# Patient Record
Sex: Female | Born: 1970 | Race: Black or African American | Hispanic: No | Marital: Single | Smoking: Never smoker
Health system: Southern US, Community
[De-identification: ages and names within clinical notes are randomized; demographics above are authoritative.]

## PROBLEM LIST (undated history)

## (undated) ENCOUNTER — Ambulatory Visit (HOSPITAL_COMMUNITY): Admission: EM | Discharge status: Absent without leave | Payer: Medicare HMO

## (undated) DIAGNOSIS — G809 Cerebral palsy, unspecified: Secondary | ICD-10-CM

## (undated) DIAGNOSIS — F79 Unspecified intellectual disabilities: Secondary | ICD-10-CM

## (undated) DIAGNOSIS — J45909 Unspecified asthma, uncomplicated: Secondary | ICD-10-CM

## (undated) DIAGNOSIS — R569 Unspecified convulsions: Secondary | ICD-10-CM

## (undated) HISTORY — DX: Unspecified intellectual disabilities: F79

## (undated) HISTORY — DX: Unspecified asthma, uncomplicated: J45.909

## (undated) HISTORY — DX: Unspecified convulsions: R56.9

## (undated) HISTORY — DX: Cerebral palsy, unspecified: G80.9

## (undated) HISTORY — PX: VENTRICULOPERITONEAL SHUNT: SHX204

---
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
## 2000-10-04 ENCOUNTER — Encounter: Admission: RE | Admit: 2000-10-04 | Discharge: 2000-10-04 | Payer: Self-pay | Admitting: Family Medicine

## 2000-11-29 ENCOUNTER — Encounter: Admission: RE | Admit: 2000-11-29 | Discharge: 2000-11-29 | Payer: Self-pay | Admitting: Family Medicine

## 2001-01-10 ENCOUNTER — Ambulatory Visit (HOSPITAL_COMMUNITY): Admission: RE | Admit: 2001-01-10 | Discharge: 2001-01-10 | Payer: Self-pay | Admitting: Family Medicine

## 2001-01-10 ENCOUNTER — Encounter: Admission: RE | Admit: 2001-01-10 | Discharge: 2001-01-10 | Payer: Self-pay | Admitting: Family Medicine

## 2001-02-06 ENCOUNTER — Emergency Department (HOSPITAL_COMMUNITY): Admission: EM | Admit: 2001-02-06 | Discharge: 2001-02-06 | Payer: Self-pay | Admitting: Emergency Medicine

## 2001-08-16 ENCOUNTER — Emergency Department (HOSPITAL_COMMUNITY): Admission: EM | Admit: 2001-08-16 | Discharge: 2001-08-16 | Payer: Self-pay | Admitting: Emergency Medicine

## 2002-04-25 ENCOUNTER — Encounter: Admission: RE | Admit: 2002-04-25 | Discharge: 2002-04-25 | Payer: Self-pay | Admitting: Family Medicine

## 2002-04-25 ENCOUNTER — Encounter: Payer: Self-pay | Admitting: Sports Medicine

## 2002-04-25 ENCOUNTER — Encounter: Admission: RE | Admit: 2002-04-25 | Discharge: 2002-04-25 | Payer: Self-pay | Admitting: Sports Medicine

## 2002-05-06 ENCOUNTER — Encounter: Admission: RE | Admit: 2002-05-06 | Discharge: 2002-05-06 | Payer: Self-pay | Admitting: Family Medicine

## 2002-07-01 ENCOUNTER — Encounter: Admission: RE | Admit: 2002-07-01 | Discharge: 2002-07-01 | Payer: Self-pay | Admitting: Family Medicine

## 2003-01-05 ENCOUNTER — Encounter: Admission: RE | Admit: 2003-01-05 | Discharge: 2003-01-05 | Payer: Self-pay | Admitting: Sports Medicine

## 2003-11-27 ENCOUNTER — Encounter: Admission: RE | Admit: 2003-11-27 | Discharge: 2004-02-25 | Payer: Self-pay | Admitting: Pediatrics

## 2004-03-02 ENCOUNTER — Encounter: Admission: RE | Admit: 2004-03-02 | Discharge: 2004-03-02 | Payer: Self-pay | Admitting: Pediatrics

## 2005-05-15 ENCOUNTER — Emergency Department (HOSPITAL_COMMUNITY): Admission: EM | Admit: 2005-05-15 | Discharge: 2005-05-15 | Payer: Self-pay | Admitting: Emergency Medicine

## 2006-06-07 ENCOUNTER — Ambulatory Visit: Payer: Self-pay | Admitting: Family Medicine

## 2006-10-11 DIAGNOSIS — R569 Unspecified convulsions: Secondary | ICD-10-CM | POA: Insufficient documentation

## 2006-10-11 DIAGNOSIS — J309 Allergic rhinitis, unspecified: Secondary | ICD-10-CM | POA: Insufficient documentation

## 2006-10-11 DIAGNOSIS — G809 Cerebral palsy, unspecified: Secondary | ICD-10-CM

## 2006-10-11 DIAGNOSIS — J45909 Unspecified asthma, uncomplicated: Secondary | ICD-10-CM

## 2006-10-11 DIAGNOSIS — F79 Unspecified intellectual disabilities: Secondary | ICD-10-CM

## 2006-10-12 ENCOUNTER — Encounter (INDEPENDENT_AMBULATORY_CARE_PROVIDER_SITE_OTHER): Payer: Self-pay | Admitting: *Deleted

## 2008-01-04 ENCOUNTER — Emergency Department (HOSPITAL_COMMUNITY): Admission: EM | Admit: 2008-01-04 | Discharge: 2008-01-04 | Payer: Self-pay | Admitting: Emergency Medicine

## 2008-11-26 ENCOUNTER — Telehealth: Payer: Self-pay | Admitting: *Deleted

## 2008-11-26 ENCOUNTER — Ambulatory Visit: Payer: Self-pay | Admitting: Family Medicine

## 2008-11-26 DIAGNOSIS — R05 Cough: Secondary | ICD-10-CM

## 2008-11-26 DIAGNOSIS — R051 Acute cough: Secondary | ICD-10-CM | POA: Insufficient documentation

## 2008-11-27 ENCOUNTER — Encounter: Payer: Self-pay | Admitting: *Deleted

## 2009-10-13 ENCOUNTER — Ambulatory Visit: Payer: Self-pay | Admitting: Family Medicine

## 2009-10-13 DIAGNOSIS — E669 Obesity, unspecified: Secondary | ICD-10-CM

## 2009-10-19 ENCOUNTER — Ambulatory Visit: Payer: Self-pay | Admitting: Family Medicine

## 2009-10-19 ENCOUNTER — Encounter: Payer: Self-pay | Admitting: Family Medicine

## 2009-10-21 LAB — CONVERTED CEMR LAB
CO2: 18 meq/L — ABNORMAL LOW (ref 19–32)
Calcium: 8.4 mg/dL (ref 8.4–10.5)
Chloride: 109 meq/L (ref 96–112)
HDL: 55 mg/dL (ref >39)
Sodium: 139 meq/L (ref 135–145)
Total CHOL/HDL Ratio: 2.7
Triglycerides: 85 mg/dL (ref <150)

## 2010-09-13 NOTE — Assessment & Plan Note (Signed)
Summary: checkup & flu shot/[facility]   Vital Signs:  Patient profile:   40 year old female Height:      64 inches Weight:      177.25 pounds BMI:     30.53 Temp:     98.0 degrees F oral Pulse rate:   96 / minute BP sitting:   126 / 85  (right arm)  Vitals Entered By: Terese Door (October 13, 2009 4:32 PM) CC: check up Is Patient Diabetic? No Pain Assessment Patient in pain? no        Primary Care Provider:  Eustaquio Boyden  MD  CC:  check up.  History of Present Illness: CC: checkup  MRCP on left side more.  maintaining weight.    sedentary lifestyle.  Goes to [facility].  fell 2 years ago and broke left arm.  since then less interactive.  Mom thinks feels afraid of repeat fall.  last pap smear was about 15 years ago, normal (had to go to hospital for anesthesia).  mom inquires about repeat.  Habits & Providers  Alcohol-Tobacco-Diet     Tobacco Status: never     Passive Smoke Exposure: yes  Exercise-Depression-Behavior     STD Risk: never  Current Medications (verified): 1)  Tegretol 200 Mg Tabs (Carbamazepine) .... One and Half Tab Two Times A Day By Mouth 2)  Depakote 250 Mg Tbec (Divalproex Sodium) .... One By Mouth Two Times A Day 3)  Ventolin Hfa 108 (90 Base) Mcg/act Aers (Albuterol Sulfate) .... 2 Puffs Q 6 Hours As Needed Wheeze 4)  Singulair 10 Mg Tabs (Montelukast Sodium) .... One By Mouth Qdaily 5)  Topamax 25 Mg Tabs (Topiramate) .... 3 Two Times A Day 6)  Aerochamber Max W/flow-Vu  Misc Actuary) .... To Use With Albuterol 7)  Guaifenesin-Codeine 300-10 Mg/61ml Liqd (Guaifenesin-Codeine) .... 5cc Every 4-6 Hours As Needed Cough; Disp 100cc 8)  Allegra 180 Mg Tabs (Fexofenadine Hcl) .... Take One By Mouth Daily  Allergies (verified): No Known Drug Allergies  Past History:  Past medical, surgical, family and social histories (including risk factors) reviewed for relevance to current acute and chronic problems.  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  Family History: Reviewed history from 10/11/2006 and no changes required. g`mother w/ HTN, DM, sister and mom with `asthma`, migraines  Social History: Reviewed history from 11/26/2008 and no changes required. Lives w/ Mom on w/ends, grandparents during wk.  No tob, etoh, drugs.  Mom w/ h/o drug abuse, therefore raised by g'mother.  Functions as 6yo level-- independent of ADL's.  Continent of urine and stool.  No contractures--walks w/out assistance.  Attends [employer] during week for "workshops"Passive Smoke Exposure:  yes STD Risk:  never  Physical Exam  General:  MRCP, responsive and pleasant Lungs:  CTAB, no wheezing noted Heart:  Normal rate and regular rhythm. S1 and S2 normal without gallop, murmur, click, rub or other extra sounds. Abdomen:  Bowel sounds positive,abdomen soft and non-tender without masses, organomegaly or hernias noted. Extremities:  no edema Skin:  Intact without suspicious lesions or rashes   Impression & Recommendations:  Problem # 1:  OBESITY, UNSPECIFIED (ICD-278.00) check FLP and BMP for glu.   Orders: [facility] - Est  18-39 yrs (99395)Future Orders: Basic Met-[facility] ([id number]) ... 10/19/2010 Lipid-[facility] ([id number]) ... 10/18/2010  Problem # 2:  ROUTINE GENERAL MEDICAL EXAM@HEALTH  CARE FACL (ICD-V70.0)  see #1.  no  flu shot available, mom will go to pharmacy to obtain.  Due for pap smear, but reassured mom that Aaliah is very low risk, likely benefits of screening outweigh risk and trauma of sedation etc, but ultimately up to mom.  If mom decides to pursue pap smear, consider referral to [facility] for conscious sedation?  Orders: [facility] - Est  18-39 yrs (65784)  Complete Medication List: 1)  Tegretol 200 Mg Tabs (Carbamazepine) .... One and half tab two times a day by mouth 2)  Depakote 250 Mg Tbec (Divalproex sodium) .... One by mouth two  times a day 3)  Ventolin Hfa 108 (90 Base) Mcg/act Aers (Albuterol sulfate) .... 2 puffs q 6 hours as needed wheeze 4)  Singulair 10 Mg Tabs (Montelukast sodium) .... One by mouth qdaily 5)  Topamax 25 Mg Tabs (Topiramate) .... 3 two times a day 6)  Aerochamber Max W/flow-vu Misc (Spacer/aero-holding chambers) .... To use with albuterol 7)  Guaifenesin-codeine 300-10 Mg/78ml Liqd (Guaifenesin-codeine) .... 5cc every 4-6 hours as needed cough; disp 100cc 8)  Allegra 180 Mg Tabs (Fexofenadine hcl) .... Take one by mouth daily  Patient Instructions: 1)  Please return fasting in the next few weeks for blood work. 2)  I will find out more information on doing a pelvic exam in the future. 3)  Debrina looks great today!  Pleasure to see you today.   Prevention & Chronic Care Immunizations   Influenza vaccine: Not documented   Influenza vaccine due: 04/14/2010    Tetanus booster: 04/14/2002: Done.   Tetanus booster due: 04/14/2012    Pneumococcal vaccine: Not documented  Other Screening   Pap smear: Done.  (08/14/1990)   Smoking status: never  (10/13/2009)  Lipids   Total Cholesterol: Not documented   LDL: Not documented   LDL Direct: Not documented   HDL: Not documented   Triglycerides: Not documented

## 2010-12-02 ENCOUNTER — Ambulatory Visit (INDEPENDENT_AMBULATORY_CARE_PROVIDER_SITE_OTHER): Payer: Medicaid Other | Admitting: Family Medicine

## 2010-12-02 ENCOUNTER — Encounter: Payer: Self-pay | Admitting: Family Medicine

## 2010-12-02 DIAGNOSIS — J309 Allergic rhinitis, unspecified: Secondary | ICD-10-CM

## 2010-12-02 DIAGNOSIS — H109 Unspecified conjunctivitis: Secondary | ICD-10-CM

## 2010-12-02 DIAGNOSIS — R059 Cough, unspecified: Secondary | ICD-10-CM

## 2010-12-02 DIAGNOSIS — R05 Cough: Secondary | ICD-10-CM

## 2010-12-02 MED ORDER — OLOPATADINE HCL 0.1 % OP SOLN: 1.0000 [drp] | Freq: Two times a day (BID) | OPHTHALMIC | Status: AC

## 2010-12-02 MED ORDER — FLUTICASONE PROPIONATE 50 MCG/ACT NA SUSP
1.0000 | Freq: Every day | NASAL | Status: DC
Start: 2010-12-02 — End: 2011-10-31

## 2010-12-02 MED ORDER — GUAIFENESIN-CODEINE 100-10 MG/5ML PO SYRP: 5.0000 mL | ORAL_SOLUTION | Freq: Two times a day (BID) | ORAL | Status: DC | PRN

## 2010-12-02 MED ORDER — CIPROFLOXACIN HCL 0.3 % OP SOLN: 1.0000 [drp] | Freq: Three times a day (TID) | OPHTHALMIC | Status: AC

## 2010-12-02 NOTE — Assessment & Plan Note (Signed)
Add nasal steroid to Zyrtec

## 2010-12-02 NOTE — Assessment & Plan Note (Signed)
Refilled cough syrup 

## 2010-12-02 NOTE — Progress Notes (Signed)
  Subjective:    Patient ID: Patricia White, female    DOB: 02-Jun-1971, 40 y.o.   MRN: [id number]  HPI  Mother brings this special needs adult in because of severe allergies this season.  Reports that she has been rubbing her right eye constantly and it is now red and swollen.  She is taking Zyrtec but it does not seem to be enough.  She coughs all night long.  Is taking all of her asthma medications, there has been some wheezing mostly at night.   Review of Systems     Objective:   Physical Exam  Constitutional:       Special needs adult  HENT:       fluorescein stain of eye did not reveal any corneal involvement.  Lid and scleral conjunctiva were injected on the right eye, not on the left.  Eyes: Pupils are equal, round, and reactive to light. Right eye exhibits no discharge. Left eye exhibits no discharge. No scleral icterus.  Cardiovascular: Normal rate, regular rhythm and normal heart sounds.   Pulmonary/Chest: Effort normal and breath sounds normal. She has no wheezes.  Lymphadenopathy:    She has no cervical adenopathy.          Assessment & Plan:

## 2010-12-02 NOTE — Assessment & Plan Note (Signed)
Allergic with suspected bacterial superimposed on the right.  Cipro ophth gtt on the right eye and Patanol in both eyes, continue Zyrtec.

## 2010-12-02 NOTE — Patient Instructions (Signed)
Right eye seems to be secondarily infected probably from scratching, use the Cipro every 2 hours tonight until she goes to bed then three times a day until the eye is better. Eye drops for allergies, hold on the right until that eye is better Add nasal spray and cough syrup but stay on the Zyrtec

## 2011-05-12 ENCOUNTER — Ambulatory Visit (INDEPENDENT_AMBULATORY_CARE_PROVIDER_SITE_OTHER): Payer: Medicaid Other | Admitting: Family Medicine

## 2011-05-12 ENCOUNTER — Encounter: Payer: Self-pay | Admitting: Family Medicine

## 2011-05-12 VITALS — BP 122/92 | HR 94 | Temp 97.9°F | Ht 63.0 in | Wt 180.7 lb

## 2011-05-12 DIAGNOSIS — Z23 Encounter for immunization: Secondary | ICD-10-CM

## 2011-05-12 MED ORDER — OMEPRAZOLE 20 MG PO CPDR: 20.0000 mg | DELAYED_RELEASE_CAPSULE | Freq: Every day | ORAL | Status: DC

## 2011-05-12 NOTE — Patient Instructions (Signed)
It was nice meeting you today.  Please be sure you are taking your zyrtec for you allergies.  I will see you back next year or sooner as needed.

## 2011-05-15 NOTE — Progress Notes (Signed)
  Subjective:     Patricia White is a 40 y.o. special needs female and is here for a comprehensive physical exam. The patient reports problems with her allergies.  She is having increased stuffy nose, sneezing, and itchy eyes.  Takes zyrtec, singulair and patanol.  This helps some.  Also complains of occasional abdominal pain.  Gets sour taste in her mouth sometimes.  No vomiting.  No blood in stool. Has had problems with constipation in the past but bowel movements have been normal recently.    History   Social History  . Marital Status: Single    Spouse Name: N/A    Number of Children: N/A  . Years of Education: N/A   Occupational History  . Not on file.   Social History Main Topics  . Smoking status: Never Smoker   . Smokeless tobacco: Not on file  . Alcohol Use: Not on file  . Drug Use: Not on file  . Sexually Active: Not on file   Other Topics Concern  . Not on file   Social History Narrative  . No narrative on file   Health Maintenance  Topic Date Due  . Pap Smear  12/18/1990  . Tetanus/tdap  04/14/2012  . Influenza Vaccine  05/14/2012    The following portions of the patient's history were reviewed and updated as appropriate: allergies, current medications, past family history, past medical history, past social history, past surgical history and problem list.  Review of Systems A comprehensive review of systems was negative.   Objective:    BP 122/92  Pulse 94  Temp(Src) 97.9 F (36.6 C) (Oral)  Ht 5\' 3"  (1.6 m)  Wt 180 lb 11.2 oz (81.965 kg)  BMI 32.01 kg/m2 General appearance: alert, no distress and MRCP Head: Normocephalic, without obvious abnormality, atraumatic Eyes: conjunctivae/corneas clear. PERRL, EOM's intact. Fundi benign. Ears: normal TM's and external ear canals both ears Nose: Nares normal. Septum midline. Mucosa normal. No drainage or sinus tenderness. Throat: lips, mucosa, and tongue normal; teeth and gums normal Neck: no adenopathy, supple,  symmetrical, trachea midline and thyroid not enlarged, symmetric, no tenderness/mass/nodules Lungs: clear to auscultation bilaterally Heart: regular rate and rhythm, S1, S2 normal, no murmur, click, rub or gallop Abdomen: soft, non-tender; bowel sounds normal; no masses,  no organomegaly and Mild epigastric tenderness.  No guarding or distention.  NABS Extremities: extremities normal, atraumatic, no cyanosis or edema Pulses: 2+ and symmetric Skin: Skin color, texture, turgor normal. No rashes or lesions    Assessment/Plan    1.  Allergies:  I will have her continue her zyrtec, cetirizine and patanol as needed for symptoms.  She does occasionally miss doses, encouraged to take daily for symptom control 2.  Abdominal pain:  I think this may be related to GERD, I will start her on a PPI to see if this improves her symptoms.  3.  Female exam:  Declined today,  Low risk.  Last pap had to be done with sedation.

## 2011-10-31 ENCOUNTER — Ambulatory Visit (INDEPENDENT_AMBULATORY_CARE_PROVIDER_SITE_OTHER): Payer: Medicaid Other | Admitting: Family Medicine

## 2011-10-31 VITALS — BP 118/82 | HR 114 | Temp 98.4°F | Ht 63.0 in | Wt 178.2 lb

## 2011-10-31 DIAGNOSIS — R05 Cough: Secondary | ICD-10-CM

## 2011-10-31 DIAGNOSIS — J309 Allergic rhinitis, unspecified: Secondary | ICD-10-CM

## 2011-10-31 MED ORDER — FLUTICASONE PROPIONATE 50 MCG/ACT NA SUSP: 1.0000 | Freq: Every day | NASAL | Status: DC

## 2011-10-31 MED ORDER — CETIRIZINE HCL 10 MG PO TABS: 10.0000 mg | ORAL_TABLET | Freq: Every day | ORAL | Status: DC

## 2011-10-31 NOTE — Progress Notes (Signed)
  Subjective:    Patient ID: Patricia White, female    DOB: 03/24/1971, 41 y.o.   MRN: [id number]  HPI Discussed patient and examined with MS3.  Agree with her documentation.  1 week of cough, sore throat.  No dyspnea, fever, emesis, diarrhea.  I have reviewed patient's  PMH, FH, and Social history and Medications as related to this visit. History of asthma  Review of Systems See hpi    Objective:   Physical Exam GEN: Alert & Oriented, No acute distress HEENT: /AT. EOMI, PERRLA, no conjunctival injection or scleral icterus.  Bilateral tympanic membranes intact without erythema or effusion.  .  Nares without edema or rhinorrhea.  Oropharynx is without erythema or exudates.  No anterior or posterior cervical lymphadenopathy. CV:  Regular Rate & Rhythm, no murmur Respiratory:  Normal work of breathing, CTAB        Assessment & Plan:

## 2011-10-31 NOTE — Progress Notes (Signed)
  Subjective:    Patient ID: Patricia White, female    DOB: 1971-03-03, 41 y.o.   MRN: [id number]  HPI Patricia White is a 41 yo F with PMHx of asthma and seizures who presents with cough and pharyngitis for one week.  History is provided by pt and her grandmother.  Pt reports that most bothersome symptoms are cough, sore throat, runny nose, and congestion.  Pt denies HA, earaches, f/n/v/d, myalgias. Grandmother reports that cough has been getting worse during the past week.  She has tried cough medicine and Nyquil which helped for a few hours.  Reports that she has not been using her albuterol inhaler or taking any allergy medications.   Review of Systems  All other systems reviewed and are negative.       Objective:   Physical Exam  Constitutional: She appears well-developed and well-nourished.  HENT:  Right Ear: Tympanic membrane, external ear and ear canal normal.  Left Ear: Tympanic membrane, external ear and ear canal normal.  Nose: Mucosal edema present.  Mouth/Throat: Uvula is midline. Posterior oropharyngeal edema and posterior oropharyngeal erythema present. No oropharyngeal exudate.  Eyes: Conjunctivae and EOM are normal. Pupils are equal, round, and reactive to light.  Neck: Normal range of motion. Neck supple.  Cardiovascular: Normal rate, regular rhythm and normal heart sounds.  Exam reveals no gallop and no friction rub.   No murmur heard. Pulmonary/Chest: Effort normal and breath sounds normal. No stridor. No respiratory distress. She has no wheezes. She has no rales.  Lymphadenopathy:    She has no cervical adenopathy.  Skin: Skin is warm and dry.   Blood pressure 118/82, pulse 114, temperature 98.4 F (36.9 C), temperature source Oral, weight 178 lb 3.2 oz (80.831 kg), last menstrual period 10/24/2011.  Rapid Strep: negative      Assessment & Plan:

## 2011-10-31 NOTE — Patient Instructions (Signed)
Thank you for visiting [place] today. You have bronchitis caused by a virus.  This will most likely resolve on its own in a few days. Your asthma and allergies are also making you feel bad. Take zyrtec once daily. Use flonase nasal spray two sprays in each nostril in the morning and in the evening. Please call us or return if you develop a fever, shortness of breath, or worsening cough.   Acute Bronchitis Bronchitis is when the organs and tissues involved in breathing get puffy (swollen) and can leak fluid. This makes it harder for air to get in and out of the lungs. You may cough a lot and produce thick spit (mucus). Acute means the illness started suddenly. HOME CARE  Rest.   Drink enough fluids to keep the pee (urine) clear or pale yellow.   Medicines may be given that will open up your airways to help you breathe better. Only take medicine as told by your doctor.   Use a cool mist vaporizer. This will help to thin any thick spit.   Do not smoke. Avoid secondhand smoke.  GET HELP RIGHT AWAY IF:   You have a temperature by mouth above 102 F (38.9 C), not controlled by medicine.   You have chills.   You develop severe shortness of breath or chest pain.   You have bloody spit mixed with mucus (sputum).   You throw up (vomit) often.   You lose too much body fluid (dehydrated).   You have a severe headache.   You feel faint.   You do not improve after 1 week of treatment.  MAKE SURE YOU:   Understand these instructions.   Will watch your condition.   Will get help right away if you are not doing well or get worse.  Document Released: 01/17/2008 Document Revised: 07/20/2011 Document Reviewed: 08/18/2009 [facility] Patient Information 2012 [place], [place].

## 2011-10-31 NOTE — Assessment & Plan Note (Addendum)
Pt reports one week history of cough and pharyngitis.  Rapid strep negative today.  Do not suspect bacterial infection as lungs are CTAB and pt denies fever.  Suspect bronchitis likely due to viral etiology.  Instructed patient to use flonase nasal spray, zyrtec, and saltwater gargles to help with symptoms.  Agree with MS3 note as above.  Dx: viral uri.  Supportive tx, given red flags for return.

## 2012-06-26 ENCOUNTER — Ambulatory Visit: Payer: Medicaid Other

## 2012-07-01 ENCOUNTER — Ambulatory Visit (INDEPENDENT_AMBULATORY_CARE_PROVIDER_SITE_OTHER): Payer: Medicaid Other | Admitting: *Deleted

## 2012-07-01 DIAGNOSIS — Z23 Encounter for immunization: Secondary | ICD-10-CM

## 2013-01-14 ENCOUNTER — Telehealth: Payer: Self-pay | Admitting: Nurse Practitioner

## 2013-01-14 MED ORDER — DIVALPROEX SODIUM ER 250 MG PO TB24: 500.0000 mg | ORAL_TABLET | Freq: Two times a day (BID) | ORAL | Status: DC

## 2013-01-14 MED ORDER — TEGRETOL 200 MG PO TABS: 300.0000 mg | ORAL_TABLET | Freq: Two times a day (BID) | ORAL | Status: DC

## 2013-01-14 MED ORDER — TOPAMAX 25 MG PO TABS: 75.0000 mg | ORAL_TABLET | Freq: Three times a day (TID) | ORAL | Status: DC

## 2013-01-14 NOTE — Telephone Encounter (Signed)
Rxs printed, signed and faxed.

## 2013-06-11 ENCOUNTER — Ambulatory Visit (INDEPENDENT_AMBULATORY_CARE_PROVIDER_SITE_OTHER): Payer: Medicare Other | Admitting: *Deleted

## 2013-06-11 DIAGNOSIS — Z23 Encounter for immunization: Secondary | ICD-10-CM | POA: Diagnosis not present

## 2013-06-26 ENCOUNTER — Ambulatory Visit: Payer: Self-pay | Admitting: Nurse Practitioner

## 2013-07-14 ENCOUNTER — Other Ambulatory Visit: Payer: Self-pay | Admitting: Nurse Practitioner

## 2013-07-23 ENCOUNTER — Ambulatory Visit: Payer: Self-pay | Admitting: Nurse Practitioner

## 2013-08-13 ENCOUNTER — Other Ambulatory Visit: Payer: Self-pay | Admitting: Diagnostic Neuroimaging

## 2013-08-13 ENCOUNTER — Other Ambulatory Visit: Payer: Self-pay | Admitting: Nurse Practitioner

## 2013-09-16 ENCOUNTER — Other Ambulatory Visit: Payer: Self-pay | Admitting: Diagnostic Neuroimaging

## 2013-09-17 ENCOUNTER — Other Ambulatory Visit: Payer: Self-pay

## 2013-09-17 MED ORDER — TOPAMAX 25 MG PO TABS: 75.0000 mg | ORAL_TABLET | Freq: Two times a day (BID) | ORAL | Status: DC

## 2013-09-17 MED ORDER — DEPAKOTE ER 250 MG PO TB24: 500.0000 mg | ORAL_TABLET | Freq: Two times a day (BID) | ORAL | Status: DC

## 2013-09-17 MED ORDER — TEGRETOL 200 MG PO TABS: 300.0000 mg | ORAL_TABLET | Freq: Two times a day (BID) | ORAL | Status: DC

## 2013-09-17 NOTE — Telephone Encounter (Signed)
[employer] requires written Rx's for BMN drugs.  They will not pay the claim if it is sent E-Rx.  Patient has an appt scheduled in May

## 2013-12-12 ENCOUNTER — Ambulatory Visit (INDEPENDENT_AMBULATORY_CARE_PROVIDER_SITE_OTHER): Payer: Medicare Other | Admitting: Diagnostic Neuroimaging

## 2013-12-12 ENCOUNTER — Encounter (INDEPENDENT_AMBULATORY_CARE_PROVIDER_SITE_OTHER): Payer: Self-pay

## 2013-12-12 ENCOUNTER — Encounter: Payer: Self-pay | Admitting: Diagnostic Neuroimaging

## 2013-12-12 VITALS — BP 126/84 | HR 99 | Temp 97.8°F | Ht 63.0 in | Wt 177.0 lb

## 2013-12-12 DIAGNOSIS — F71 Moderate intellectual disabilities: Secondary | ICD-10-CM | POA: Diagnosis not present

## 2013-12-12 DIAGNOSIS — G40109 Localization-related (focal) (partial) symptomatic epilepsy and epileptic syndromes with simple partial seizures, not intractable, without status epilepticus: Secondary | ICD-10-CM

## 2013-12-12 DIAGNOSIS — G253 Myoclonus: Secondary | ICD-10-CM | POA: Diagnosis not present

## 2013-12-12 NOTE — Progress Notes (Signed)
[facility]  PATIENT: Patricia White DOB: 08/20/1970  REFERRING CLINICIAN:  HISTORY FROM: patient and mother REASON FOR VISIT: follow up   HISTORICAL  CHIEF COMPLAINT:  Chief Complaint  Patient presents with  . Follow-up    rm 7, 1 year per EMR    HISTORY OF PRESENT ILLNESS:   UPDATE 12/12/13: 43 year old female with moderate MR, congenital left hemiparesis, here for follow up of seizure d/o. Last seizure in 2009. Still with intermittent myoclonus in the AM, 3 times per week. Tolerating meds.  PRIOR HPI (07/23/12, CM):  43 year old black female returns today for followup. She was last seen in our office 07/17/11. She is a previous pt of Dr. Sharene SkeansHickling. 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 She returns today with her grandmother. She was going  to [facility] but the funding was cut.  She has had no seizures  in several years.  She is currently on 3 different medications.She has had no falls. Continues to have left lower extremity weakness with AFO in place. She intermittently wears a helmet but she does not have it on today. No hospitalizations or acute infections since last seen.    REVIEW OF SYSTEMS: Full 14 system review of systems performed and notable only for nothing.  ALLERGIES: Allergies  Allergen Reactions  . Seasonal Ic [Cholestatin]     Runny nose, sneezing    HOME MEDICATIONS: Outpatient Prescriptions Prior to Visit  Medication Sig Dispense Refill  . DEPAKOTE ER 250 MG 24 hr tablet Take 2 tablets (500 mg total) by mouth 2 (two) times daily.  120 tablet  3  . TEGRETOL 200 MG tablet Take 1.5 tablets (300 mg total) by mouth 2 (two) times daily.  90 tablet  3  . TOPAMAX 25 MG tablet Take 3 tablets (75 mg total) by mouth 2 (two) times daily.  180 tablet  3  . albuterol (VENTOLIN HFA) 108 (90 BASE) MCG/ACT inhaler Inhale 2 puffs into the lungs every 6 (six) hours as needed. wheeze       . cetirizine (ZYRTEC) 10 MG  tablet Take 1 tablet (10 mg total) by mouth daily. Take one by mouth at bedtime for allergies(change from allegra 2/2 prior authorization)  30 tablet  11  . fluticasone (FLONASE) 50 MCG/ACT nasal spray Place 1 spray into the nose daily.  16 g  2  . montelukast (SINGULAIR) 10 MG tablet One by mouth qdaily       . omeprazole (PRILOSEC) 20 MG capsule Take 1 capsule (20 mg total) by mouth daily.  30 capsule  6  . Spacer/Aero-Holding Chambers (AEROCHAMBER MAX W/FLOW-VU) MISC To use with albuterol        No facility-administered medications prior to visit.    PAST MEDICAL HISTORY: No past medical history on file.  PAST SURGICAL HISTORY: Past Surgical History  Procedure Laterality Date  . Ventriculoperitoneal shunt      FAMILY HISTORY: Family History  Problem Relation Age of Onset  . Migraines Mother   . Cancer Mother     liver cancer  . Diabetes Maternal Aunt   . Hypertension Maternal Grandmother     SOCIAL HISTORY:  History   Social History  . Marital Status: Single    Spouse Name: N/A    Number of Children: 0  . Years of Education: [email or web address].school   Occupational History  .      disabled0   Social History Main Topics  .  Smoking status: Never Smoker   . Smokeless tobacco: Never Used  . Alcohol Use: No  . Drug Use: No  . Sexual Activity: Not on file   Other Topics Concern  . Not on file   Social History Narrative   Patient is right handed, resides with grandmother([name] Berges)     PHYSICAL EXAM  Filed Vitals:   12/12/13 1204  BP: 126/84  Pulse: 99  Temp: 97.8 F (36.6 C)  TempSrc: Oral  Height: 5\' 3"  (1.6 m)  Weight: 177 lb (80.287 kg)    Not recorded    Body mass index is 31.36 kg/(m^2).  GENERAL EXAM: Patient is in no distress; well developed, nourished and groomed; neck is supple; POOR DENTITION.  CARDIOVASCULAR: Regular rate and rhythm, no murmurs, no carotid bruits  NEUROLOGIC: MENTAL STATUS: awake, alert, DECR FLUENCY, COMP INTACT.  PLEASANT.  CRANIAL NERVE: no papilledema on fundoscopic exam, pupils equal and reactive to light, visual fields full to confrontation, extraocular muscles intact, no nystagmus, facial sensation and strength symmetric, hearing intact, palate elevates symmetrically, uvula midline, shoulder shrug symmetric, tongue midline. MOTOR: LEFT ARM AND LEG ATROPHY, CONGENTIAL SPASTIC LEFT HEMIPARESIS; RIGHT SIDE 5/5/. LUE (4), LLE (3-4).  SENSORY: normal and symmetric to light touch  COORDINATION: finger-nose-finger, fine finger movements normal REFLEXES: deep tendon reflexes present and symmetric GAIT/STATION: SPASTIC, LEFT HEMIPARETIC GAIT    DIAGNOSTIC DATA (LABS, IMAGING, TESTING) - I reviewed patient records, labs, notes, testing and imaging myself where available.  No results found for this basename: WBC, HGB, HCT, MCV, PLT      Component Value Date/Time   NA 139 10/19/2009 1830   K 3.7 10/19/2009 1830   CL 109 10/19/2009 1830   CO2 18* 10/19/2009 1830   GLUCOSE 87 10/19/2009 1830   BUN 8 10/19/2009 1830   CREATININE 0.68 10/19/2009 1830   CALCIUM 8.4 10/19/2009 1830   Lab Results  Component Value Date   CHOL 146 10/19/2009   HDL 55 10/19/2009   LDLCALC 74 10/19/2009   TRIG 85 10/19/2009   CHOLHDL 2.7 Ratio 10/19/2009   No results found for this basename: HGBA1C   No results found for this basename: VITAMINB12   No results found for this basename: TSH      ASSESSMENT AND PLAN  43 y.o. 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 Doing well.  PLAN: - continue TPX, VPA, CBZ  Return in about 1 year (around 12/13/2014).    Suanne MarkerVIKRAM R. Kier Smead, MD 12/12/2013, 1:24 PM Certified in Neurology, Neurophysiology and Neuroimaging  [facility] [address], [address] [place], [place] [address] [phone]

## 2013-12-12 NOTE — Patient Instructions (Signed)
Continue current medications. 

## 2014-01-11 ENCOUNTER — Other Ambulatory Visit: Payer: Self-pay | Admitting: Diagnostic Neuroimaging

## 2014-01-14 ENCOUNTER — Other Ambulatory Visit: Payer: Self-pay | Admitting: Diagnostic Neuroimaging

## 2014-01-20 ENCOUNTER — Ambulatory Visit: Payer: Medicaid Other | Admitting: Family Medicine

## 2014-01-23 ENCOUNTER — Ambulatory Visit (INDEPENDENT_AMBULATORY_CARE_PROVIDER_SITE_OTHER): Payer: Medicare Other | Admitting: Family Medicine

## 2014-01-23 ENCOUNTER — Encounter: Payer: Self-pay | Admitting: Family Medicine

## 2014-01-23 VITALS — BP 122/82 | HR 98 | Temp 97.8°F | Ht 63.0 in | Wt 179.8 lb

## 2014-01-23 DIAGNOSIS — T148 Other injury of unspecified body region: Secondary | ICD-10-CM

## 2014-01-23 DIAGNOSIS — W57XXXA Bitten or stung by nonvenomous insect and other nonvenomous arthropods, initial encounter: Secondary | ICD-10-CM | POA: Insufficient documentation

## 2014-01-23 MED ORDER — HYDROCORTISONE 0.5 % EX CREA: 1.0000 "application " | TOPICAL_CREAM | Freq: Two times a day (BID) | CUTANEOUS | Status: DC

## 2014-01-23 MED ORDER — DIPHENHYDRAMINE HCL 25 MG PO TABS: 25.0000 mg | ORAL_TABLET | Freq: Three times a day (TID) | ORAL | Status: DC | PRN

## 2014-01-23 NOTE — Assessment & Plan Note (Signed)
Most likely bed bug per clinical characteristic of lesions. They do not appear infected. Symptomatic treatment with topical hydrocortisone and oral benadryl PRN itching. Discussion and information given about get rid of infestation.  F/u as needed

## 2014-01-23 NOTE — Patient Instructions (Signed)
It has been a pleasure to see you today. Please take the medications as prescribed. I have provided information to treat infestation at home. Follow up as needed.

## 2014-01-23 NOTE — Progress Notes (Signed)
Family Medicine Office Visit Note   Subjective:   Patient ID: Leveda Annaatasha C Moyd, female  DOB: 12/23/1970, 43 y.o.. MRN: [id number]   Pt that comes today for same day appointment with care giver (aunt) complaining of itchy skin lesions. Aunt is concerned about the possibility of bed bugs bites. Lesions are located on dorsal aspect of arms bilaterally, some on posterior aspect of legs and back. No abdominal, face or anterior aspect of extremities are involved. Denies fever, chills, nausea, vomiting or other systemic symptoms. No visual identification of the bug has been done but aunt reports it is an old mattress and there is a possibility of this king of infestation since pt sleeps in same bed that her grandmother and both have same symptoms.    Review of Systems:  Per HPI  Objective:   Physical Exam: Gen:  NAD HEENT: Moist mucous membranes  CV: Regular rate and rhythm, no murmurs rubs or gallops PULM: Clear to auscultation bilaterally.  EXT: No edema Skin: erythematous punctiform lesions with hemorrhagic center present in posterior aspect of arms, legs and back. Surrounded skin abrasion form scratching. No edema, no drainage.  Assessment & Plan:

## 2014-02-12 ENCOUNTER — Other Ambulatory Visit: Payer: Self-pay | Admitting: Nurse Practitioner

## 2014-06-25 ENCOUNTER — Ambulatory Visit (INDEPENDENT_AMBULATORY_CARE_PROVIDER_SITE_OTHER): Payer: Medicare Other | Admitting: *Deleted

## 2014-06-25 ENCOUNTER — Ambulatory Visit (INDEPENDENT_AMBULATORY_CARE_PROVIDER_SITE_OTHER): Payer: Medicare Other | Admitting: Family Medicine

## 2014-06-25 ENCOUNTER — Encounter: Payer: Self-pay | Admitting: Family Medicine

## 2014-06-25 DIAGNOSIS — J069 Acute upper respiratory infection, unspecified: Secondary | ICD-10-CM

## 2014-06-25 DIAGNOSIS — Z23 Encounter for immunization: Secondary | ICD-10-CM

## 2014-06-25 DIAGNOSIS — B9789 Other viral agents as the cause of diseases classified elsewhere: Principal | ICD-10-CM

## 2014-06-25 NOTE — Progress Notes (Signed)
   Subjective:  Patricia White is a 43 y.o. female with a history of MR brought in by her aunt for cold-like symptoms.  COUGH  Has been coughing for 4 days. Cough is: nonproductive, not interfering with normal activities. Sputum production: Scant white, no blood Medications tried: none Taking blood pressure medications: no  Symptoms Runny nose: Yes Mucous in back of throat: Yes Throat burning or reflux: No Wheezing or asthma: No Fever: No Chest Pain: No Shortness of breath: No Leg swelling: No Hemoptysis: No Weight loss: No  ROS see HPI Smoking Status noted  Objective:  Vitals reviewed.  Gen:  43 y.o. female in NAD HEENT: MMM, TMs poorly visualized due to yellow/brown cerumen. EOMI, PERRL, anicteric sclerae CV: RRR, no murmur Resp: Non-labored, CTAB, no wheezes noted Assessment & Plan:  Patricia White is a 43 y.o. female with:  Problem List Items Addressed This Visit      Respiratory   Viral URI with cough - Primary    No red flags.  - Tylenol q6h  - Adequate hydration - Expect clinical improvement in 10-14 total days of illness - RTC if worsening after initial improvement or new fever.  - OK for flu shot today.

## 2014-06-25 NOTE — Assessment & Plan Note (Addendum)
No red flags.  - Tylenol q6h  - Adequate hydration - Expect clinical improvement in 10-14 total days of illness - RTC if worsening after initial improvement or new fever.  - OK for flu shot today.

## 2014-06-25 NOTE — Patient Instructions (Signed)
You have the common cold. No antibiotics are required. Just stay well hydrated and come back to the clinic if you start getting worse. You should be better in 10-14 days.

## 2014-12-22 ENCOUNTER — Ambulatory Visit: Payer: Medicaid Other | Admitting: Diagnostic Neuroimaging

## 2014-12-23 ENCOUNTER — Encounter: Payer: Self-pay | Admitting: Diagnostic Neuroimaging

## 2015-01-21 ENCOUNTER — Other Ambulatory Visit: Payer: Self-pay | Admitting: Diagnostic Neuroimaging

## 2015-01-21 NOTE — Telephone Encounter (Signed)
No showed last appt  

## 2015-02-11 ENCOUNTER — Other Ambulatory Visit: Payer: Self-pay | Admitting: Diagnostic Neuroimaging

## 2015-02-11 NOTE — Telephone Encounter (Signed)
Patient has appt scheduled

## 2015-02-23 ENCOUNTER — Ambulatory Visit (INDEPENDENT_AMBULATORY_CARE_PROVIDER_SITE_OTHER): Payer: Medicare Other | Admitting: Nurse Practitioner

## 2015-02-23 ENCOUNTER — Encounter: Payer: Self-pay | Admitting: Nurse Practitioner

## 2015-02-23 VITALS — BP 127/84 | HR 99 | Ht 63.0 in | Wt 180.5 lb

## 2015-02-23 DIAGNOSIS — R5601 Complex febrile convulsions: Secondary | ICD-10-CM

## 2015-02-23 DIAGNOSIS — Z5181 Encounter for therapeutic drug level monitoring: Secondary | ICD-10-CM

## 2015-02-23 DIAGNOSIS — G809 Cerebral palsy, unspecified: Secondary | ICD-10-CM | POA: Diagnosis not present

## 2015-02-23 DIAGNOSIS — G808 Other cerebral palsy: Secondary | ICD-10-CM | POA: Insufficient documentation

## 2015-02-23 DIAGNOSIS — G8194 Hemiplegia, unspecified affecting left nondominant side: Secondary | ICD-10-CM

## 2015-02-23 DIAGNOSIS — F71 Moderate intellectual disabilities: Secondary | ICD-10-CM | POA: Diagnosis not present

## 2015-02-23 DIAGNOSIS — G8190 Hemiplegia, unspecified affecting unspecified side: Secondary | ICD-10-CM

## 2015-02-23 MED ORDER — TOPAMAX 25 MG PO TABS: 75.0000 mg | ORAL_TABLET | Freq: Two times a day (BID) | ORAL | Status: DC

## 2015-02-23 MED ORDER — DEPAKOTE ER 250 MG PO TB24: 500.0000 mg | ORAL_TABLET | Freq: Two times a day (BID) | ORAL | Status: DC

## 2015-02-23 MED ORDER — TEGRETOL 200 MG PO TABS: ORAL_TABLET | ORAL | Status: DC

## 2015-02-23 NOTE — Patient Instructions (Signed)
Continue Depakote at current dose will refill brand Continue Topamax at current dose will refill brand Continue carbamazepine at current dose will refill brand Rx for new  AFO to left leg, [employer] [phone] CBC CMP Follow-up yearly and when necessary

## 2015-02-23 NOTE — Progress Notes (Signed)
[facility]  PATIENT: Patricia White DOB: 02/27/1971   REASON FOR VISIT: Follow-up for moderate MR, congenital left hemiparesis , history of seizure disorder , gait abnormality HISTORY FROM aunt    HISTORY OF PRESENT ILLNESS:UPDATE 02/23/15: 44 year old female with moderate MR, congenital left hemiparesis, here for follow up of seizure d/o. Last seizure in 2009. Still with intermittent myoclonus in the AM, 1-2 times per week. Tolerating meds without side effects needs refills and labs. She also needs a new AFO to her left lower extremity. No recent falls.  PRIOR HPI (07/23/12, CM): 44 year old black female returns today for followup. She was last seen in our office 07/17/11. She is a previous pt of Dr. Sharene SkeansHickling. 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 She returns today with her grandmother. [facility]. She has had no seizures in several years. She is currently on 3 different medications.She has had no falls. Continues to have left lower extremity weakness with AFO in place. She intermittently wears a helmet but she does not have it on today. No hospitalizations or acute infections since last seen.     REVIEW OF SYSTEMS: Full 14 system review of systems performed and notable only for those listed, all others are neg:  Constitutional: neg  Cardiovascular: neg Ear/Nose/Throat: neg  Skin: neg Eyes: neg Respiratory: Shortness of breath Gastroitestinal: neg  Hematology/Lymphatic: neg  Endocrine: neg Musculoskeletal: Walking difficulty Allergy/Immunology: neg Neurological: Seizure disorder, tremors Psychiatric: Confusion Sleep : neg   ALLERGIES: No Known Allergies  HOME MEDICATIONS: Outpatient Prescriptions Prior to Visit  Medication Sig Dispense Refill  . DEPAKOTE ER 250 MG 24 hr tablet TAKE 2 TABLETS BY MOUTH TWICE A DAY 120 tablet 0  . diphenhydrAMINE (BENADRYL) 25 MG tablet  Take 1 tablet (25 mg total) by mouth every 8 (eight) hours as needed. 30 tablet 0  . hydrocortisone cream 0.5 % Apply 1 application topically 2 (two) times daily. 30 g 0  . TEGRETOL 200 MG tablet TAKE 1 AND 1/2 TABLETS BY MOUTH TWICE A DAY 90 tablet 0  . TOPAMAX 25 MG tablet Take 3 tablets (75 mg total) by mouth 2 (two) times daily. 180 tablet 12   No facility-administered medications prior to visit.    PAST MEDICAL HISTORY: History reviewed. No pertinent past medical history.  PAST SURGICAL HISTORY: Past Surgical History  Procedure Laterality Date  . Ventriculoperitoneal shunt      FAMILY HISTORY: Family History  Problem Relation Age of Onset  . Migraines Mother   . Cancer Mother     liver cancer  . Diabetes Maternal Aunt   . Hypertension Maternal Grandmother     SOCIAL HISTORY: History   Social History  . Marital Status: Single    Spouse Name: N/A  . Number of Children: 0  . Years of Education: dis.school   Occupational History  .      disabled0   Social History Main Topics  . Smoking status: Never Smoker   . Smokeless tobacco: Never Used  . Alcohol Use: No  . Drug Use: No  . Sexual Activity: Not on file   Other Topics Concern  . Not on file   Social History Narrative   Patient is right handed, resides with grandmother([name] Hackenberg)     PHYSICAL EXAM  Filed Vitals:   02/23/15 1010  BP: 127/84  Pulse: 99  Height: 5\' 3"  (1.6 m)  Weight:  180 lb 8 oz (81.874 kg)   Body mass index is 31.98 kg/(m^2).  Generalized: Well developed, in no acute distress  Head: normocephalic and atraumatic,. Oropharynx benign poor dentition  Neck: Supple, no carotid bruits  Cardiac: Regular rate rhythm, no murmur  Musculoskeletal: Left hemiparesis   Neurological examination   Mentation: Alert decreased fluency pleasant .  Follows most  Commands.  Cranial nerve II-XII: Fundoscopic exam without papilledema ,Pupils were equal round reactive to light extraocular  movements were full, visual field were full on confrontational test. Facial sensation and strength were normal. hearing was intact to finger rubbing bilaterally. Uvula tongue midline. head turning and shoulder shrug were normal and symmetric.Tongue protrusion into cheek strength was normal. Motor: Left arm and leg atrophy, congenital spastic left hemiparesis and left upper extremity 4 out of 5 left lower extremity 3 out of 5 with AFO right side 5 out of 5  Sensory: normal and symmetric to light touch, pinprick, and  Vibration,  Coordination: finger-nose-finger, normal Reflexes: Symmetric upper and lower, plantar responses were flexor bilaterally. Gait and Station: Rising up from seated position without assistance, wide based stance spastic left hemiparetic gait DIAGNOSTIC DATA (LABS, IMAGING, TESTING) -  ASSESSMENT AND PLAN  44 y.o. year old female  has no past medical history of moderate MR, gait disorder, congenital left hemiparesis and seizure disorder with last seizure occurring in 2009.  Continue Depakote at current dose will refill brand generic was filled at last refill even though dispense as written Continue Topamax at current dose will refill brand Continue carbamazepine at current dose will refill brand Made aunt aware that  generic seizure medications are not equivalent to brand name drugs as the FDA allows significant variance of up to 20% in absorption and peak dose of the medication. For an epilepsy patient this could be the difference between control and breakthrough seizures and therefore I feel she should stay on name brand Rx for new  AFO to left leg, [employer] [id number] CBC CMP today Follow-up yearly and when necessary Vst time 24 min Nilda Riggs, [facility], APRN  [facility], [facility] [address], [address] [place], [place] [address] [phone]

## 2015-02-24 LAB — CBC WITH DIFFERENTIAL/PLATELET
BASOS ABS: 0 10*3/uL (ref 0.0–0.2)
BASOS: 0 %
EOS (ABSOLUTE): 0.1 10*3/uL (ref 0.0–0.4)
Eos: 2 %
HEMOGLOBIN: 11.7 g/dL (ref 11.1–15.9)
Hematocrit: 36.7 % (ref 34.0–46.6)
Immature Grans (Abs): 0 10*3/uL (ref 0.0–0.1)
Immature Granulocytes: 0 %
Lymphocytes Absolute: 1.6 10*3/uL (ref 0.7–3.1)
Lymphs: 38 %
MCH: 29.9 pg (ref 26.6–33.0)
MCHC: 31.9 g/dL (ref 31.5–35.7)
MCV: 94 fL (ref 79–97)
Monocytes Absolute: 0.3 10*3/uL (ref 0.1–0.9)
Monocytes: 8 %
Neutrophils Absolute: 2.1 10*3/uL (ref 1.4–7.0)
Neutrophils: 52 %
Platelets: 302 10*3/uL (ref 150–379)
RBC: 3.91 x10E6/uL (ref 3.77–5.28)
RDW: 14.1 % (ref 12.3–15.4)
WBC: 4.1 10*3/uL (ref 3.4–10.8)

## 2015-02-24 LAB — COMPREHENSIVE METABOLIC PANEL
ALT: 11 IU/L (ref 0–32)
AST: 15 IU/L (ref 0–40)
Albumin/Globulin Ratio: 1 — ABNORMAL LOW (ref 1.1–2.5)
Albumin: 4 g/dL (ref 3.5–5.5)
Alkaline Phosphatase: 82 IU/L (ref 39–117)
BILIRUBIN TOTAL: 0.2 mg/dL (ref 0.0–1.2)
BUN/Creatinine Ratio: 6 — ABNORMAL LOW (ref 9–23)
BUN: 4 mg/dL — AB (ref 6–24)
CHLORIDE: 106 mmol/L (ref 97–108)
CO2: 20 mmol/L (ref 18–29)
CREATININE: 0.71 mg/dL (ref 0.57–1.00)
Calcium: 8.6 mg/dL — ABNORMAL LOW (ref 8.7–10.2)
GFR calc Af Amer: 121 mL/min/{1.73_m2} (ref >59)
GFR, EST NON AFRICAN AMERICAN: 105 mL/min/{1.73_m2} (ref >59)
GLOBULIN, TOTAL: 4.1 g/dL (ref 1.5–4.5)
Glucose: 101 mg/dL — ABNORMAL HIGH (ref 65–99)
Potassium: 3.8 mmol/L (ref 3.5–5.2)
SODIUM: 142 mmol/L (ref 134–144)
Total Protein: 8.1 g/dL (ref 6.0–8.5)

## 2015-02-25 NOTE — Progress Notes (Signed)
Quick Note:  I called and gave aunt the results of her lab results (ok). She asked about prescription for depends( for incontinence). I told her to contact pcp. She said she would. ______

## 2015-03-09 NOTE — Progress Notes (Signed)
I reviewed note and agree with plan.   VIKRAM R. PENUMALLI, MD  Certified in Neurology, Neurophysiology and Neuroimaging  [facility] [address], [address] [place], [place] [address] [phone]   

## 2015-04-07 ENCOUNTER — Other Ambulatory Visit: Payer: Self-pay | Admitting: Diagnostic Neuroimaging

## 2015-06-01 ENCOUNTER — Ambulatory Visit (INDEPENDENT_AMBULATORY_CARE_PROVIDER_SITE_OTHER): Payer: Medicare Other | Admitting: Family Medicine

## 2015-06-01 VITALS — BP 153/79 | HR 106 | Temp 98.2°F | Wt 178.5 lb

## 2015-06-01 DIAGNOSIS — H9201 Otalgia, right ear: Secondary | ICD-10-CM | POA: Diagnosis not present

## 2015-06-01 DIAGNOSIS — M79601 Pain in right arm: Secondary | ICD-10-CM | POA: Diagnosis present

## 2015-06-01 DIAGNOSIS — Z23 Encounter for immunization: Secondary | ICD-10-CM

## 2015-06-01 NOTE — Patient Instructions (Signed)
Thank you for coming in,   She can try ibuprofen 400 mg every 6 hours for her pain.  If the pain doesn't get any better then we may need to get x-rays in 2-3 weeks.   Please try to find the area of the most pain.   Sign up for My Chart to have easy access to your labs results, and communication with your Primary care physician   Please feel free to call with any questions or concerns at any time, at [phone]. --Dr. Jordan LikesSchmitz

## 2015-06-01 NOTE — Assessment & Plan Note (Signed)
Exam and history are limited.  Nothing that stands out today on exam.  Lateral epicondylitis vs extensor muscle strain vs radial tunnel syndrome.  - given home modalities  - advised ibuprofen 400 mg PRN  - if no improvement in 2-3 week then consider imaging. [facility] as exam and history are limited

## 2015-06-01 NOTE — Assessment & Plan Note (Signed)
Lesion occuring on posterior aspect of ear.  No erythema or streaking and non pigmented  - advised to apply neosporin to area.

## 2015-06-01 NOTE — Progress Notes (Signed)
   Subjective:    Patient ID: Patricia White, female    DOB: 10/13/1970, [age] y.o.   MRN: [id number]  She has a PMH of infantile CP with left hemiparesis and mental retardation.  Seen for Same day visit for   CC: arm pain   Location: occuring in the right antecubital fossa  Pain started: started on Saturday  Pain is: getting worse. Has never had injury to that area before.  Severity: 10/10 Medications tried: tylenol and motrin and aleve  Recent trauma: no Similar pain previously: no No hx of blood clots.   Symptoms Redness:no Swelling:yes Fever: no Weakness: no Weight loss: no Rash: no  Right ear pain:   Pain occurring behind her right ear.  There is a rash there  Started on Saturday  Having pain inside the ear canal  No loss of hearing.  No prior injury to ear.  No hx of repeated ear infections.    Review of Systems   See HPI for ROS. Objective:  BP 153/79 mmHg  Pulse 106  Temp(Src) 98.2 F (36.8 C) (Oral)  Wt 178 lb 8 oz (80.967 kg)  General: NAD HEENT: annular lesion roughly 1 mm in diameter on the posterior aspect of the helix of right ear. TM's clear and intact b/l, clear conjunctiva  MSK: left sided weakness of UE at baseline, left arm with obvious deformity Right shoulder with normal appearance   Normal Right shoulder flexion and abduction  Elbow Exam:  Laterality: right Appearance: No obvious deformity, no erythema or ecchymosis  Tenderness:  diffuse tenderness along the lateral aspect of the forearm and lateral epicondyles Range of Motion: Normal extension and flexion and supination and pronation Strength:  Wrist extension: 5/5 Wrist flexion: 5/5 Neurovascularly intact  Normal grip strength     Assessment & Plan:   Right arm pain Exam and history are limited.  Nothing that stands out today on exam.  Lateral epicondylitis vs extensor muscle strain vs radial tunnel syndrome.  - given home modalities  - advised ibuprofen 400 mg PRN  - if no  improvement in 2-3 week then consider imaging. [facility] as exam and history are limited   Right ear pain Lesion occuring on posterior aspect of ear.  No erythema or streaking and non pigmented  - advised to apply neosporin to area.

## 2015-09-21 ENCOUNTER — Telehealth: Payer: Self-pay | Admitting: *Deleted

## 2015-09-22 NOTE — Telephone Encounter (Signed)
Spoke with Monia Pouch, received PA for Tegretol, ref # [id number], approved 08/13/15 thrru 08/13/16

## 2015-09-27 ENCOUNTER — Encounter: Payer: Self-pay | Admitting: *Deleted

## 2015-09-30 ENCOUNTER — Telehealth: Payer: Self-pay | Admitting: Nurse Practitioner

## 2015-09-30 NOTE — Telephone Encounter (Signed)
Patient's aunt is calling to get a refill for medication DEPAKOTE ER 250 MG 24 hr tablet [facility] [address] [facility]

## 2015-09-30 NOTE — Telephone Encounter (Signed)
It appears a 1 year Rx was provided in July.  I called the pharmacy, they stated patient still has several refills on file, it appears the ins is not paying at this time.  They will continue to try and reprocess Rx periodically to see if it will go through.   Per note on med list, Patricia White has kindly contacted ins regarding this, and is pending reply.  I called patient back to relay this info.  Phone rang nearly 20 times with no answer.  Message said "your party is not answering".  No option to leave message.

## 2015-10-01 NOTE — Telephone Encounter (Signed)
I called again.  Phone rang several times with no answer, no option to leave message.  

## 2015-10-03 DIAGNOSIS — Z710 Person encountering health services to consult on behalf of another person: Secondary | ICD-10-CM

## 2015-10-04 ENCOUNTER — Other Ambulatory Visit: Payer: Self-pay | Admitting: Diagnostic Neuroimaging

## 2015-10-05 NOTE — Telephone Encounter (Signed)
I called and got approval for depakote er  2 tabs po bid for pt from [employer]. Spoke to [employer] and then [employer]  [phone] approved.  08/13/15 thru 08/13/16.  [facility], then call grandmother.  I told her to call and state needs refill on depakote er (brand name).  She stated that she has new insurance [employer].  I told her I got approval from [place].  If problem they will let us know.  Pt has been out of medication for a week, and needs to get back on this.

## 2015-10-05 NOTE — Addendum Note (Signed)
Addended byHermenia Fiscal on: 10/05/2015 03:25 PM   Modules accepted: Medications

## 2015-10-05 NOTE — Telephone Encounter (Signed)
Patricia White with [facility] called sts insurance will [facility] for generic [facility]. Please forward RX for generic

## 2015-10-05 NOTE — Telephone Encounter (Signed)
I called and LMVM for Alona Bene to return call re: medication for pt.

## 2015-10-06 ENCOUNTER — Other Ambulatory Visit: Payer: Self-pay

## 2015-10-06 MED ORDER — DEPAKOTE ER 250 MG PO TB24: 500.0000 mg | ORAL_TABLET | Freq: Two times a day (BID) | ORAL | Status: DC

## 2015-10-06 NOTE — Telephone Encounter (Signed)
Patient already has refills on previous medication for depakote ER. PTs  Pharmacy was call and verifiued.

## 2015-12-03 ENCOUNTER — Telehealth: Payer: Self-pay | Admitting: *Deleted

## 2015-12-03 NOTE — Telephone Encounter (Signed)
I initiated PA on both brand name depakote and topamax.  [facility] part [facility]#  [id number],  BIN  [id number], PCN [id number].

## 2015-12-06 NOTE — Telephone Encounter (Signed)
Approval for both brand name given thru 08-13-16 Topamax PA [id number]   [phone] and approval for brand name depakote thru 08-13-16 PA [id number] .  [facility] [address] [phone].  (received fax confirmation).

## 2016-02-03 ENCOUNTER — Telehealth: Payer: Self-pay | Admitting: *Deleted

## 2016-02-03 ENCOUNTER — Telehealth: Payer: Self-pay | Admitting: Nurse Practitioner

## 2016-02-03 NOTE — Telephone Encounter (Signed)
Spoke with [name], [facility] and started PA for brand Tegretol. [name] stated it will go to medical review with determination within 24-48 hours.  PA Reference number [id number]. Patient's [employer] pharmacy ID # [id number].

## 2016-02-03 NOTE — Telephone Encounter (Signed)
Received PA request for Tegretol which has 2 PAs documented in Epic.  [facility], spoke with Misty StanleyLisa, pharmacist and advised. She stated the medication is being rejected. Gave her PA numbers for both instances, 09/21/15 and 12/06/15. She stated she would call to find out the issue; she will call back if needed.

## 2016-02-03 NOTE — Telephone Encounter (Signed)
error 

## 2016-02-03 NOTE — Telephone Encounter (Signed)
[facility] called back and stated that [facility] says they do not have a PA on record for the Tegretol.  Her number [phone]

## 2016-02-03 NOTE — Telephone Encounter (Signed)
Spoke to HarveyLisa, [facility], [employer] pharmacist and informed her Tegretol has PA [id number]. Approved through 08/13/16. She verbalized understanding.

## 2016-02-23 ENCOUNTER — Encounter: Payer: Self-pay | Admitting: Nurse Practitioner

## 2016-02-23 ENCOUNTER — Ambulatory Visit (INDEPENDENT_AMBULATORY_CARE_PROVIDER_SITE_OTHER): Payer: Medicare Other | Admitting: Nurse Practitioner

## 2016-02-23 VITALS — BP 128/85 | HR 93 | Ht 63.0 in | Wt 182.2 lb

## 2016-02-23 DIAGNOSIS — G809 Cerebral palsy, unspecified: Secondary | ICD-10-CM

## 2016-02-23 DIAGNOSIS — G40909 Epilepsy, unspecified, not intractable, without status epilepticus: Secondary | ICD-10-CM | POA: Diagnosis not present

## 2016-02-23 DIAGNOSIS — Z5181 Encounter for therapeutic drug level monitoring: Secondary | ICD-10-CM

## 2016-02-23 DIAGNOSIS — F71 Moderate intellectual disabilities: Secondary | ICD-10-CM

## 2016-02-23 DIAGNOSIS — G8194 Hemiplegia, unspecified affecting left nondominant side: Secondary | ICD-10-CM

## 2016-02-23 DIAGNOSIS — R569 Unspecified convulsions: Secondary | ICD-10-CM | POA: Insufficient documentation

## 2016-02-23 MED ORDER — DEPAKOTE ER 250 MG PO TB24: 500.0000 mg | ORAL_TABLET | Freq: Two times a day (BID) | ORAL | Status: DC

## 2016-02-23 MED ORDER — TOPAMAX 25 MG PO TABS: 75.0000 mg | ORAL_TABLET | Freq: Two times a day (BID) | ORAL | Status: DC

## 2016-02-23 MED ORDER — TEGRETOL 200 MG PO TABS: ORAL_TABLET | ORAL | Status: DC

## 2016-02-23 NOTE — Progress Notes (Signed)
[facility]  PATIENT: Patricia White [birth year]  REASON FOR VISIT: Follow-up for seizure disorder, moderate mental retardation, cerebral palsy, left hemiparesis HISTORY FROM: Patricia White   HISTORY OF PRESENT ILLNESS:UPDATE 7/12/17CM: 45 year old female with moderate MR, congenital left hemiparesis, here for yearly follow up of seizure d/o. Last seizure in 2009. Still with intermittent myoclonus in the AM, 1-2 times per week. Tolerating meds without side effects needs refills and labs. She also needs a new AFO to her left lower extremity. No recent falls. No new medical issues since last seen. She returns for reevaluation  PRIOR HPI (07/23/12, CM): 45 year old black female returns today for followup. She was last seen in our office 07/17/11. She is a previous pt of Dr. Sharene White. 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 She returns today with her Patricia White. [facility]. She has had no seizures in several years. She is currently on 3 different medications.She has had no falls. Continues to have left lower extremity weakness with AFO in place. She intermittently wears a helmet but she does not have it on today. No hospitalizations or acute infections since last seen.    REVIEW OF SYSTEMS: Full 14 system review of systems performed and notable only for those listed, all others are neg:  Constitutional: neg  Cardiovascular: neg Ear/Nose/Throat: neg  Skin: neg Eyes: neg Respiratory: neg Gastroitestinal: neg  Hematology/Lymphatic: neg  Endocrine: neg Musculoskeletal: Walking difficulty Allergy/Immunology: neg Neurological: Seizure disorder Psychiatric: neg Sleep : neg   ALLERGIES: No Known Allergies  HOME MEDICATIONS: Outpatient Prescriptions Prior to Visit  Medication Sig Dispense Refill  . DEPAKOTE ER 250 MG 24 hr tablet Take 2 tablets (500 mg total) by mouth 2 (two) times  daily. 120 tablet 11  . diphenhydrAMINE (BENADRYL) 25 MG tablet Take 1 tablet (25 mg total) by mouth every 8 (eight) hours as needed. 30 tablet 0  . hydrocortisone cream 0.5 % Apply 1 application topically 2 (two) times daily. 30 g 0  . TEGRETOL 200 MG tablet TAKE 1 AND 1/2 TABLETS BY MOUTH TWICE A DAY 90 tablet 11  . TOPAMAX 25 MG tablet Take 3 tablets (75 mg total) by mouth 2 (two) times daily. 180 tablet 12   No facility-administered medications prior to visit.    PAST MEDICAL HISTORY: History reviewed. No pertinent past medical history.  PAST SURGICAL HISTORY: Past Surgical History  Procedure Laterality Date  . Ventriculoperitoneal shunt      FAMILY HISTORY: Family History  Problem Relation Age of Onset  . Migraines Mother   . Cancer Mother     liver cancer  . Diabetes Maternal Aunt   . Hypertension Maternal Patricia White     SOCIAL HISTORY: Social History   Social History  . Marital Status: Single    Spouse Name: N/A  . Number of Children: 0  . Years of Education: dis.school   Occupational History  .      disabled0   Social History Main Topics  . Smoking status: Never Smoker   . Smokeless tobacco: Never Used  . Alcohol Use: No  . Drug Use: No  . Sexual Activity: Not on file   Other Topics Concern  . Not on file   Social History Narrative   Patient is right handed, resides with Patricia White(Patricia White)     PHYSICAL EXAM  Filed Vitals:   02/23/16 1111  BP: 128/85  Pulse: 93  Height: 5\' 3"  (1.6 m)  Weight: 182 lb 3.2 oz (82.645 kg)   Body mass index is 32.28 kg/(m^2). Generalized: Well developed, Obese female in no acute distress  Head: normocephalic and atraumatic,. Oropharynx benign poor dentition  Neck: Supple, no carotid bruits  Cardiac: Regular rate rhythm, no murmur  Musculoskeletal: Left hemiparesis   Neurological examination   Mentation: Alert decreased fluency pleasant . Follows most commands.  Cranial nerve II-XII:  Fundoscopic exam without papilledema ,Pupils were equal round reactive to light extraocular movements were full, visual field were full on confrontational test. Facial sensation and strength were normal. hearing was intact to finger rubbing bilaterally. Uvula tongue midline. head turning and shoulder shrug were normal and symmetric.Tongue protrusion into cheek strength was normal. Motor: Left arm and leg atrophy, congenital spastic left hemiparesis and left upper extremity 4 out of 5 left lower extremity 3 out of 5 with AFO, right side 5 out of 5  Sensory: normal and symmetric to light touch, pinprick, and Vibration,  Coordination: finger-nose-finger, normal on right unable to perform on the left Reflexes: Symmetric upper and lower, plantar responses were flexor bilaterally. Gait and Station: Rising up from seated position without assistance, wide based stance spastic left hemiparetic gait, no assistive device  DIAGNOSTIC DATA (LABS, IMAGING, TESTING)   ASSESSMENT AND PLAN 45 y.o. year old female has no past medical history of moderate MR, gait disorder, congenital left hemiparesis and seizure disorder with last seizure occurring in 2009. Has episodes of myoclonus 1-2 times a week  Continue Depakote at current dose will refill brand  Continue Topamax at current dose will refill brand Continue carbamazepine at current dose will refill brand CBC CMP , VPA and CBZ today Rx for new AFO to left leg, [employer] [id number] Follow-up yearly and when necessary  Patricia RiggsNancy Carolyn Jahid White, [facility]NP, [facility], APRN  [facility] [address], [address] [place], [place] [address] [phone]

## 2016-02-23 NOTE — Progress Notes (Signed)
I reviewed note and agree with plan.   Carlethia Mesquita R. Lynnwood Beckford, MD  Certified in Neurology, Neurophysiology and Neuroimaging  [facility] [address], [address] [place], [place] [address] [phone]   

## 2016-02-23 NOTE — Patient Instructions (Addendum)
Continue Depakote at current dose will refill brand  Continue Topamax at current dose will refill brand Continue carbamazepine at current dose will refill brand CBC CMP today, VPA and CBZ level Rx for new AFO to left leg, [employer] [phone] Follow-up yearly and when necessary

## 2016-02-24 LAB — COMPREHENSIVE METABOLIC PANEL
A/G RATIO: 0.9 — AB (ref 1.2–2.2)
ALBUMIN: 3.9 g/dL (ref 3.5–5.5)
ALK PHOS: 84 IU/L (ref 39–117)
ALT: 11 IU/L (ref 0–32)
AST: 16 IU/L (ref 0–40)
BILIRUBIN TOTAL: 0.2 mg/dL (ref 0.0–1.2)
BUN / CREAT RATIO: 6 — AB (ref 9–23)
BUN: 4 mg/dL — AB (ref 6–24)
CHLORIDE: 103 mmol/L (ref 96–106)
CO2: 20 mmol/L (ref 18–29)
Calcium: 9.2 mg/dL (ref 8.7–10.2)
Creatinine, Ser: 0.65 mg/dL (ref 0.57–1.00)
GFR calc non Af Amer: 109 mL/min/{1.73_m2} (ref >59)
GFR, EST AFRICAN AMERICAN: 126 mL/min/{1.73_m2} (ref >59)
GLUCOSE: 79 mg/dL (ref 65–99)
Globulin, Total: 4.4 g/dL (ref 1.5–4.5)
POTASSIUM: 3.9 mmol/L (ref 3.5–5.2)
SODIUM: 140 mmol/L (ref 134–144)
Total Protein: 8.3 g/dL (ref 6.0–8.5)

## 2016-02-24 LAB — CBC WITH DIFFERENTIAL/PLATELET
BASOS ABS: 0 10*3/uL (ref 0.0–0.2)
Basos: 0 %
EOS (ABSOLUTE): 0.1 10*3/uL (ref 0.0–0.4)
Eos: 1 %
Hematocrit: 36.4 % (ref 34.0–46.6)
Hemoglobin: 11.5 g/dL (ref 11.1–15.9)
IMMATURE GRANS (ABS): 0 10*3/uL (ref 0.0–0.1)
IMMATURE GRANULOCYTES: 0 %
LYMPHS: 32 %
Lymphocytes Absolute: 2 10*3/uL (ref 0.7–3.1)
MCH: 29.9 pg (ref 26.6–33.0)
MCHC: 31.6 g/dL (ref 31.5–35.7)
MCV: 95 fL (ref 79–97)
MONOS ABS: 0.6 10*3/uL (ref 0.1–0.9)
Monocytes: 9 %
NEUTROS PCT: 58 %
Neutrophils Absolute: 3.6 10*3/uL (ref 1.4–7.0)
PLATELETS: 315 10*3/uL (ref 150–379)
RBC: 3.84 x10E6/uL (ref 3.77–5.28)
RDW: 14.3 % (ref 12.3–15.4)
WBC: 6.2 10*3/uL (ref 3.4–10.8)

## 2016-02-24 LAB — CARBAMAZEPINE LEVEL, TOTAL: Carbamazepine (Tegretol), S: 10.7 ug/mL (ref 4.0–12.0)

## 2016-02-24 LAB — VALPROIC ACID LEVEL: VALPROIC ACID LVL: 56 ug/mL (ref 50–100)

## 2016-02-25 ENCOUNTER — Telehealth: Payer: Self-pay | Admitting: *Deleted

## 2016-02-25 NOTE — Telephone Encounter (Signed)
No answer, home #.

## 2016-02-25 NOTE — Telephone Encounter (Signed)
-----   Message from Nilda RiggsNancy Carolyn Martin, NP sent at 02/24/2016  9:51 AM EDT ----- Labs good please call the grandmother

## 2016-02-25 NOTE — Telephone Encounter (Signed)
I  Spoke to mother of pt and relayed the results of the labs (looked good).  She verbalized understanding.

## 2016-03-05 ENCOUNTER — Other Ambulatory Visit: Payer: Self-pay | Admitting: Nurse Practitioner

## 2016-06-20 ENCOUNTER — Ambulatory Visit (INDEPENDENT_AMBULATORY_CARE_PROVIDER_SITE_OTHER): Payer: Medicare Other | Admitting: Family Medicine

## 2016-06-20 ENCOUNTER — Encounter: Payer: Self-pay | Admitting: Family Medicine

## 2016-06-20 DIAGNOSIS — G40909 Epilepsy, unspecified, not intractable, without status epilepticus: Secondary | ICD-10-CM | POA: Diagnosis not present

## 2016-06-20 DIAGNOSIS — Z23 Encounter for immunization: Secondary | ICD-10-CM

## 2016-06-20 DIAGNOSIS — G809 Cerebral palsy, unspecified: Secondary | ICD-10-CM

## 2016-06-20 NOTE — Progress Notes (Signed)
Subjective: ZO:XWRUECC:clear ears, get paperwork filled out HPI: Patient is a 45 y.o. female with a past medical history of seizure d/o, cerebral palsy, left heiparesis, moderate mental retardation  presenting to clinic today to get her ears cleaned and flu vaccine.  Ears: no pain in ears, no difficulty hearing. She complains of earwax that bothers her. She denies putting anything in her ears such as Q-tips. Request possibility of having them cleaned today.   Paperwork to fill out: paperwork for home health aid.  Patient has seizure d/o, cerebral palsy, left heiparesis, moderate mental retardation. Still having intermittent myoclonus on the R side which is multiple times per days which is increased over the last few months from when she last saw neurology. Maternal aunt, Alfonse RasDolores, who is the patient's legal guardian, notes that these movements/seizures are more frequnt and stronger (sometimes almost take her to the floor).   Needs assistance with dressing, bathing, feeding,  Patient can go to bathroom by herself, but needs assistance with wiping. Patient can ambulate on her own.  Maternal aunt and maternal grandmother are present 24hrs but now need assistance.  Patient can no longer dress or bath herself over the last 6-12 months which is a decline in her status. No falls or aggressive behavior  Never had an aid before but guardian feels that with her decline, they are not able to give her the best care as previously.  Social History: lives with maternal grandmother and aunt who are their legal guardians.  Health Maintenance: would like flu vaccine today  ROS: All other systems reviewed and are negative.  Past Medical History Patient Active Problem List   Diagnosis Date Noted  . Seizure disorder (HCC) 02/23/2016  . Right arm pain 06/01/2015  . Right ear pain 06/01/2015  . Left hemiparesis (HCC) 02/23/2015  . 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 OBESITY, UNSPECIFIED 10/13/2009  .  MENTAL RETARDATION 10/11/2006  . Infantile cerebral palsy (HCC) 10/11/2006  . RHINITIS, ALLERGIC 10/11/2006  . ASTHMA, UNSPECIFIED 10/11/2006  . Convulsions (HCC) 10/11/2006    Medications- reviewed and updated  Objective: Office vital signs reviewed. BP (!) 138/98   Pulse (!) 102   Temp 97.6 F (36.4 C) (Oral)   Ht 5\' 3"  (1.6 m)   Wt 185 lb 6.4 oz (84.1 kg)   BMI 32.84 kg/m    Physical Examination:  General: Awake, alert, well- nourished, NAD. Able to answer question apprpropriately ENMT:  Initially, cerumen impaction bilaterally that is very deep on the left side. after irrigation, TMs intact, normal light reflex, no erythema, no bulging. Nasal turbinates moist. MMM, Oropharynx clear without erythema or tonsillar exudate/hypertrophy. Poor dentition. MSK: decreased muscle tone on the left compared to the R in the UE and LE. Spasticity of the left hand.   Gait:Able to rise from a seated position without assistance. Wide based gait, left hemiparetic gait, no assistive device needed. Neuro: 5/5 strength in right UE and LE. Able to raise L UE to 80 degrees. 3/5 strength in L hip, 4 /5 strength in L knee, unable to dorsi or plantar flex on the L (unsure if this is due to following commands vs weakness).   Assessment/Plan: Infantile cerebral palsy (HCC) Completed paperwork for Medicaid home health aids.  Seizure disorder (HCC) Given more frequent and stronger myoclonic movements, advised guardian to have the patient evaluated by neurology in the near future to determine if she needs a change in medication vs increase in dosage.   Cerumen impaction: TMs  clear after irrigation. Discussed continuing to avoid putting anything in her ears which could make it worse. Continue to monitor.   Health maintenance: flu vaccine given today. Orders Placed This Encounter  Procedures  . Flu Vaccine QUAD 36+ mos IM    No orders of the defined types were placed in this encounter.   Joanna Puffrystal S.  [place] PGY-3, [facility]

## 2016-06-20 NOTE — Assessment & Plan Note (Signed)
Completed paperwork for Medicaid home health aids.

## 2016-06-20 NOTE — Assessment & Plan Note (Signed)
Given more frequent and stronger myoclonic movements, advised guardian to have the patient evaluated by neurology in the near future to determine if she needs a change in medication vs increase in dosage.

## 2016-06-20 NOTE — Patient Instructions (Addendum)
It was nice to meet you. Today we cleaned out Carson's ears. These make sure that we are not putting anything in her ears.  I filled out her paperwork for home assistance.  You should follow-up with her neurologist concerning her worsening seizures. They may need to change her medications.  Cerumen Impaction The structures of the external ear canal secrete a waxy substance known as cerumen. Excess cerumen can build up in the ear canal, causing a condition known as cerumen impaction. Cerumen impaction can cause ear pain and disrupt the function of the ear. The rate of cerumen production differs for each individual. In certain individuals, the configuration of the ear canal may decrease his or her ability to naturally remove cerumen. CAUSES Cerumen impaction is caused by excessive cerumen production or buildup. RISK FACTORS  Frequent use of swabs to clean ears.  Having narrow ear canals.  Having eczema.  Being dehydrated. SIGNS AND SYMPTOMS  Diminished hearing.  Ear drainage.  Ear pain.  Ear itch. TREATMENT Treatment may involve:  Over-the-counter or prescription ear drops to soften the cerumen.  Removal of cerumen by a health care provider. This may be done with:  Irrigation with warm water. This is the most common method of removal.  Ear curettes and other instruments.  Surgery. This may be done in severe cases. HOME CARE INSTRUCTIONS  Take medicines only as directed by your health care provider.  Do not insert objects into the ear with the intent of cleaning the ear. PREVENTION  Do not insert objects into the ear, even with the intent of cleaning the ear. Removing cerumen as a part of normal hygiene is not necessary, and the use of swabs in the ear canal is not recommended.  Drink enough water to keep your urine clear or pale yellow.  Control your eczema if you have it. SEEK MEDICAL CARE IF:  You develop ear pain.  You develop bleeding from the ear.  The  cerumen does not clear after you use ear drops as directed.   This information is not intended to replace advice given to you by your health care provider. Make sure you discuss any questions you have with your health care provider.   Document Released: 09/07/2004 Document Revised: 08/21/2014 Document Reviewed: 03/17/2015 Elsevier Interactive Patient Education [employer]2016 [employer] Inc[employer]

## 2017-02-13 ENCOUNTER — Other Ambulatory Visit: Payer: Self-pay | Admitting: Nurse Practitioner

## 2017-02-23 ENCOUNTER — Other Ambulatory Visit: Payer: Self-pay | Admitting: Nurse Practitioner

## 2017-02-26 ENCOUNTER — Ambulatory Visit: Payer: Medicare Other | Admitting: Nurse Practitioner

## 2017-02-27 ENCOUNTER — Encounter: Payer: Self-pay | Admitting: Nurse Practitioner

## 2017-03-20 ENCOUNTER — Other Ambulatory Visit: Payer: Self-pay | Admitting: Nurse Practitioner

## 2017-04-12 ENCOUNTER — Ambulatory Visit (INDEPENDENT_AMBULATORY_CARE_PROVIDER_SITE_OTHER): Payer: Medicare Other | Admitting: *Deleted

## 2017-04-12 ENCOUNTER — Encounter: Payer: Self-pay | Admitting: *Deleted

## 2017-04-12 VITALS — BP 132/74 | HR 91 | Temp 98.5°F | Ht 63.0 in | Wt 182.0 lb

## 2017-04-12 DIAGNOSIS — Z23 Encounter for immunization: Secondary | ICD-10-CM

## 2017-04-12 DIAGNOSIS — Z0000 Encounter for general adult medical examination without abnormal findings: Secondary | ICD-10-CM

## 2017-04-12 NOTE — Progress Notes (Signed)
Subjective:   Patricia White is a 46 y.o. female who presents with maternal aunt for an Initial Medicare Annual Wellness Visit.  Cardiac Risk Factors include: obesity (BMI >30kg/m2);sedentary lifestyle     Objective:    Today's Vitals   04/12/17 1115  BP: 132/74  Pulse: 91  Temp: 98.5 F (36.9 C)  TempSrc: Oral  SpO2: 97%  Weight: 182 lb (82.6 kg)  Height: 5\' 3"  (1.6 m)  PainSc: 0-No pain   Body mass index is 32.24 kg/m.   Current Medications (verified) Outpatient Encounter Prescriptions as of 04/12/2017  Medication Sig  . DEPAKOTE ER 250 MG 24 hr tablet TAKE 2 TABLETS (500 MG TOTAL) BY MOUTH 2 (TWO) TIMES DAILY.  Marland Kitchen. TEGRETOL 200 MG tablet TAKE 1 AND 1/2 TABLETS BY MOUTH TWICE A DAY  . TOPAMAX 25 MG tablet TAKE 3 TABLETS (75 MG TOTAL) BY MOUTH 2 (TWO) TIMES DAILY.  . [DISCONTINUED] diphenhydrAMINE (BENADRYL) 25 MG tablet Take 1 tablet (25 mg total) by mouth every 8 (eight) hours as needed. (Patient not taking: Reported on 04/12/2017)  . [DISCONTINUED] hydrocortisone cream 0.5 % Apply 1 application topically 2 (two) times daily. (Patient not taking: Reported on 04/12/2017)   No facility-administered encounter medications on file as of 04/12/2017.     Allergies (verified) Patient has no known allergies.   History: Past Medical History:  Diagnosis Date  . Asthma   . Cerebral palsy (HCC) Birth  . Mental retardation Birth  . Seizures (HCC)    History reviewed. No pertinent surgical history. Family History  Problem Relation Age of Onset  . Migraines Mother   . Cancer Mother        liver cancer  . Diabetes Maternal Aunt   . Hypertension Maternal Grandmother    Social History   Occupational History  .      disabled0   Social History Main Topics  . Smoking status: Never Smoker  . Smokeless tobacco: Never Used  . Alcohol use No  . Drug use: No  . Sexual activity: No    Tobacco Counseling Patient has never smoked and has no plans to start.  Activities of  Daily Living In your present state of health, do you have any difficulty performing the following activities: 04/12/2017  Hearing? N  Vision? N  Difficulty concentrating or making decisions? N  Walking or climbing stairs? Y  Dressing or bathing? Y  Doing errands, shopping? Y  Preparing Food and eating ? Y  Using the Toilet? Y  In the past six months, have you accidently leaked urine? Y  Do you have problems with loss of bowel control? N  Managing your Medications? N  Managing your Finances? N  Housekeeping or managing your Housekeeping? N  Some recent data might be hidden   Home Safety:  My home has a working smoke alarm:  Yes X 5           My home throw rugs have been fastened down to the floor or removed:  No. Discussed removing or tacking down. I have a non-slip surface or non-slip mats in the bathtub and shower:  Non-slip surface, shower chair        All my home's stairs have handrails, including any outdoor stairs  One level home with 5 outside steps with handrails          My home's floors, stairs and hallways are free from clutter, wires and cords:  Yes     I have animals  in my home  Yes, [place]  I wear seatbelts consistently:  Yes   Immunizations and Health Maintenance Immunization History  Administered Date(s) Administered  . Influenza Split 05/12/2011, 07/01/2012  . Influenza,inj,Quad PF,6+ Mos 06/11/2013, 06/25/2014, 06/01/2015, 06/20/2016, 04/12/2017  . Td 04/14/2002   Health Maintenance Due  Topic Date Due  . HIV Screening  12/18/1987  . PAP SMEAR  12/17/1993  . TETANUS/TDAP  04/14/2012   Patient has no sexual history Will not allow pap smear Patient will obtain TDaP at local pharmacy Flu vaccine administered today  Patient Care Team: Casey BurkittFitzgerald, Hillary Moen, MD as PCP - General Darrol Angelarolyn Martin, NP for Neurology  Indicate any recent Medical Services you may have received from other than [facility] providers in the past year (date may be approximate).       Assessment:   This is a routine wellness examination for Patricia White.   Dietary issues and exercise activities discussed: Current Exercise Habits: The patient does not participate in regular exercise at present, Exercise limited by: orthopedic condition(s);neurologic condition(s)  Goals    . Exercise 5x per week (5-10 min per time)          Walking      Depression Screen PHQ 2/9 Scores 04/12/2017 06/20/2016 06/01/2015 06/25/2014  PHQ - 2 Score 0 0 0 0  PHQ- 9 Score - 0 - -    Fall Risk Fall Risk  04/12/2017 06/01/2015 06/25/2014  Falls in the past year? No No No  Risk for fall due to : Impaired balance/gait;Impaired mobility;Impaired vision;Medication side effect - -   TUG Test:  Done in 20 seconds. Patient used one hand to push out of chair and to sit back down. 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 Pronounced left sided limp noted. Patient holds left arm close to body at all times. Falls prevention discussed in detail and literature given.  Cognitive Function: Mini-Cog  Passed with score 3/5 Patient unable to draw clock as she does not know  numbers  Screening Tests Health Maintenance  Topic Date Due  . HIV Screening  12/18/1987  . PAP SMEAR  12/17/1993  . TETANUS/TDAP  04/14/2012  . INFLUENZA VACCINE  Completed      Plan:   Encouraged aunt to schedule OV to meet new PCP  I have personally reviewed and noted the following in the patient's chart:   . Medical and social history . Use of alcohol, tobacco or illicit drugs  . Current medications and supplements . Functional ability and status . Nutritional status . Physical activity . Advanced directives . List of other physicians . Hospitalizations, surgeries, and ER visits in previous 12 months . Vitals . Screenings to include cognitive, depression, and falls . Referrals and appointments  In addition, I have reviewed and discussed with patient certain preventive protocols, quality  metrics, and best practice recommendations. A written personalized care plan for preventive services as well as general preventive health recommendations were provided to patient.     Fredderick SeveranceDUCATTE, LAURENZE L, RN   04/13/2017

## 2017-04-12 NOTE — Patient Instructions (Addendum)
Ms. Herst,  Thank you for taking time to come for yourMedicare Wellness Visit. I appreciate your ongoing commitment to your health goals. Please review the following plan we discussed and let me know if I can assist you in the future.   These are the goals we discussed:  Goals    . Exercise 5x per week (5-10 min per time)          Walking       Fall Prevention in the Home Falls can cause injuries. They can happen to people of all ages. There are many things you can do to make your home safe and to help prevent falls. What can I do on the outside of my home?  Regularly fix the edges of walkways and driveways and fix any cracks.  Remove anything that might make you trip as you walk through a door, such as a raised step or threshold.  Trim any bushes or trees on the path to your home.  Use bright outdoor lighting.  Clear any walking paths of anything that might make someone trip, such as rocks or tools.  Regularly check to see if handrails are loose or broken. Make sure that both sides of any steps have handrails.  Any raised decks and porches should have guardrails on the edges.  Have any leaves, snow, or ice cleared regularly.  Use sand or salt on walking paths during winter.  Clean up any spills in your garage right away. This includes oil or grease spills. What can I do in the bathroom?  Use night lights.  Install grab bars by the toilet and in the tub and shower. Do not use towel bars as grab bars.  Use non-skid mats or decals in the tub or shower.  If you need to sit down in the shower, use a plastic, non-slip stool.  Keep the floor dry. Clean up any water that spills on the floor as soon as it happens.  Remove soap buildup in the tub or shower regularly.  Attach bath mats securely with double-sided non-slip rug tape.  Do not have throw rugs and other things on the floor that can make you trip. What can I do in the bedroom?  Use night lights.  Make sure  that you have a light by your bed that is easy to reach.  Do not use any sheets or blankets that are too big for your bed. They should not hang down onto the floor.  Have a firm chair that has side arms. You can use this for support while you get dressed.  Do not have throw rugs and other things on the floor that can make you trip. What can I do in the kitchen?  Clean up any spills right away.  Avoid walking on wet floors.  Keep items that you use a lot in easy-to-reach places.  If you need to reach something above you, use a strong step stool that has a grab bar.  Keep electrical cords out of the way.  Do not use floor polish or wax that makes floors slippery. If you must use wax, use non-skid floor wax.  Do not have throw rugs and other things on the floor that can make you trip. What can I do with my stairs?  Do not leave any items on the stairs.  Make sure that there are handrails on both sides of the stairs and use them. Fix handrails that are broken or loose. Make sure that handrails  are as long as the stairways.  Check any carpeting to make sure that it is firmly attached to the stairs. Fix any carpet that is loose or worn.  Avoid having throw rugs at the top or bottom of the stairs. If you do have throw rugs, attach them to the floor with carpet tape.  Make sure that you have a light switch at the top of the stairs and the bottom of the stairs. If you do not have them, ask someone to add them for you. What else can I do to help prevent falls?  Wear shoes that: ? Do not have high heels. ? Have rubber bottoms. ? Are comfortable and fit you well. ? Are closed at the toe. Do not wear sandals.  If you use a stepladder: ? Make sure that it is fully opened. Do not climb a closed stepladder. ? Make sure that both sides of the stepladder are locked into place. ? Ask someone to hold it for you, if possible.  Clearly mark and make sure that you can see: ? Any grab bars or  handrails. ? First and last steps. ? Where the edge of each step is.  Use tools that help you move around (mobility aids) if they are needed. These include: ? Canes. ? Walkers. ? Scooters. ? Crutches.  Turn on the lights when you go into a dark area. Replace any light bulbs as soon as they burn out.  Set up your furniture so you have a clear path. Avoid moving your furniture around.  If any of your floors are uneven, fix them.  If there are any pets around you, be aware of where they are.  Review your medicines with your doctor. Some medicines can make you feel dizzy. This can increase your chance of falling. Ask your doctor what other things that you can do to help prevent falls. This information is not intended to replace advice given to you by your health care provider. Make sure you discuss any questions you have with your health care provider. Document Released: 05/27/2009 Document Revised: 01/06/2016 Document Reviewed: 09/04/2014 Elsevier Interactive Patient Education  2018 [place] Maintenance, Female Adopting a healthy lifestyle and getting preventive care can go a long way to promote health and wellness. Talk with your health care provider about what schedule of regular examinations is right for you. This is a good chance for you to check in with your provider about disease prevention and staying healthy. In between checkups, there are plenty of things you can do on your own. Experts have done a lot of research about which lifestyle changes and preventive measures are most likely to keep you healthy. Ask your health care provider for more information. Weight and diet Eat a healthy diet  Be sure to include plenty of vegetables, fruits, low-fat dairy products, and lean protein.  Do not eat a lot of foods high in solid fats, added sugars, or salt.  Get regular exercise. This is one of the most important things you can do for your health. ? Most adults should  exercise for at least 150 minutes each week. The exercise should increase your heart rate and make you sweat (moderate-intensity exercise). ? Most adults should also do strengthening exercises at least twice a week. This is in addition to the moderate-intensity exercise.  Maintain a healthy weight  Body mass index (BMI) is a measurement that can be used to identify possible weight problems. It estimates body fat based  on height and weight. Your health care provider can help determine your BMI and help you achieve or maintain a healthy weight.  For females 44 years of age and older: ? A BMI below 18.5 is considered underweight. ? A BMI of 18.5 to 24.9 is normal. ? A BMI of 25 to 29.9 is considered overweight. ? A BMI of 30 and above is considered obese.  Watch levels of cholesterol and blood lipids  You should start having your blood tested for lipids and cholesterol at 44 years of age, then have this test every 5 years.  You may need to have your cholesterol levels checked more often if: ? Your lipid or cholesterol levels are high. ? You are older than 44 years of age. ? You are at high risk for heart disease.  Cancer screening Lung Cancer  Lung cancer screening is recommended for adults 30-8 years old who are at high risk for lung cancer because of a history of smoking.  A yearly low-dose CT scan of the lungs is recommended for people who: ? Currently smoke. ? Have quit within the past 15 years. ? Have at least a 30-pack-year history of smoking. A pack year is smoking an average of one pack of cigarettes a day for 1 year.  Yearly screening should continue until it has been 15 years since you quit.  Yearly screening should stop if you develop a health problem that would prevent you from having lung cancer treatment.  Breast Cancer  Practice breast self-awareness. This means understanding how your breasts normally appear and feel.  It also means doing regular breast  self-exams. Let your health care provider know about any changes, no matter how small.  If you are in your 20s or 30s, you should have a clinical breast exam (CBE) by a health care provider every 1-3 years as part of a regular health exam.  If you are 51 or older, have a CBE every year. Also consider having a breast X-ray (mammogram) every year.  If you have a family history of breast cancer, talk to your health care provider about genetic screening.  If you are at high risk for breast cancer, talk to your health care provider about having an MRI and a mammogram every year.  Breast cancer gene (BRCA) assessment is recommended for women who have family members with BRCA-related cancers. BRCA-related cancers include: ? Breast. ? Ovarian. ? Tubal. ? Peritoneal cancers.  Results of the assessment will determine the need for genetic counseling and BRCA1 and BRCA2 testing.  Cervical Cancer Your health care provider may recommend that you be screened regularly for cancer of the pelvic organs (ovaries, uterus, and vagina). This screening involves a pelvic examination, including checking for microscopic changes to the surface of your cervix (Pap test). You may be encouraged to have this screening done every 3 years, beginning at age 80.  For women ages 6-65, health care providers may recommend pelvic exams and Pap testing every 3 years, or they may recommend the Pap and pelvic exam, combined with testing for human papilloma virus (HPV), every 5 years. Some types of HPV increase your risk of cervical cancer. Testing for HPV may also be done on women of any age with unclear Pap test results.  Other health care providers may not recommend any screening for nonpregnant women who are considered low risk for pelvic cancer and who do not have symptoms. Ask your health care provider if a screening pelvic exam is right for  you.  If you have had past treatment for cervical cancer or a condition that could  lead to cancer, you need Pap tests and screening for cancer for at least 20 years after your treatment. If Pap tests have been discontinued, your risk factors (such as having a new sexual partner) need to be reassessed to determine if screening should resume. Some women have medical problems that increase the chance of getting cervical cancer. In these cases, your health care provider may recommend more frequent screening and Pap tests.  Colorectal Cancer  This type of cancer can be detected and often prevented.  Routine colorectal cancer screening usually begins at 44 years of age and continues through 44 years of age.  Your health care provider may recommend screening at an earlier age if you have risk factors for colon cancer.  Your health care provider may also recommend using home test kits to check for hidden blood in the stool.  A small camera at the end of a tube can be used to examine your colon directly (sigmoidoscopy or colonoscopy). This is done to check for the earliest forms of colorectal cancer.  Routine screening usually begins at age 70.  Direct examination of the colon should be repeated every 5-10 years through 44 years of age. However, you may need to be screened more often if early forms of precancerous polyps or small growths are found.  Skin Cancer  Check your skin from head to toe regularly.  Tell your health care provider about any new moles or changes in moles, especially if there is a change in a mole's shape or color.  Also tell your health care provider if you have a mole that is larger than the size of a pencil eraser.  Always use sunscreen. Apply sunscreen liberally and repeatedly throughout the day.  Protect yourself by wearing long sleeves, pants, a wide-brimmed hat, and sunglasses whenever you are outside.  Heart disease, diabetes, and high blood pressure  High blood pressure causes heart disease and increases the risk of stroke. High blood pressure  is more likely to develop in: ? People who have blood pressure in the high end of the normal range (130-139/85-89 mm Hg). ? People who are overweight or obese. ? People who are African American.  If you are 62-44 years of age, have your blood pressure checked every 3-5 years. If you are 17 years of age or older, have your blood pressure checked every year. You should have your blood pressure measured twice-once when you are at a hospital or clinic, and once when you are not at a hospital or clinic. Record the average of the two measurements. To check your blood pressure when you are not at a hospital or clinic, you can use: ? An automated blood pressure machine at a pharmacy. ? A home blood pressure monitor.  If you are between 90 years and 26 years old, ask your health care provider if you should take aspirin to prevent strokes.  Have regular diabetes screenings. This involves taking a blood sample to check your fasting blood sugar level. ? If you are at a normal weight and have a low risk for diabetes, have this test once every three years after 44 years of age. ? If you are overweight and have a high risk for diabetes, consider being tested at a younger age or more often. Preventing infection Hepatitis B  If you have a higher risk for hepatitis B, you should be screened for this  virus. You are considered at high risk for hepatitis B if: ? You were born in a country where hepatitis B is common. Ask your health care provider which countries are considered high risk. ? Your parents were born in a high-risk country, and you have not been immunized against hepatitis B (hepatitis B vaccine). ? You have HIV or AIDS. ? You use needles to inject street drugs. ? You live with someone who has hepatitis B. ? You have had sex with someone who has hepatitis B. ? You get hemodialysis treatment. ? You take certain medicines for conditions, including cancer, organ transplantation, and autoimmune  conditions.  Hepatitis C  Blood testing is recommended for: ? Everyone born from [birth year] through 1963. ? Anyone with known risk factors for hepatitis C.  Sexually transmitted infections (STIs)  You should be screened for sexually transmitted infections (STIs) including gonorrhea and chlamydia if: ? You are sexually active and are younger than 44 years of age. ? You are older than 44 years of age and your health care provider tells you that you are at risk for this type of infection. ? Your sexual activity has changed since you were last screened and you are at an increased risk for chlamydia or gonorrhea. Ask your health care provider if you are at risk.  If you do not have HIV, but are at risk, it may be recommended that you take a prescription medicine daily to prevent HIV infection. This is called pre-exposure prophylaxis (PrEP). You are considered at risk if: ? You are sexually active and do not regularly use condoms or know the HIV status of your partner(s). ? You take drugs by injection. ? You are sexually active with a partner who has HIV.  Talk with your health care provider about whether you are at high risk of being infected with HIV. If you choose to begin PrEP, you should first be tested for HIV. You should then be tested every 3 months for as long as you are taking PrEP. Pregnancy  If you are premenopausal and you may become pregnant, ask your health care provider about preconception counseling.  If you may become pregnant, take 400 to 800 micrograms (mcg) of folic acid every day.  If you want to prevent pregnancy, talk to your health care provider about birth control (contraception). Osteoporosis and menopause  Osteoporosis is a disease in which the bones lose minerals and strength with aging. This can result in serious bone fractures. Your risk for osteoporosis can be identified using a bone density scan.  If you are 61 years of age or older, or if you are at risk for  osteoporosis and fractures, ask your health care provider if you should be screened.  Ask your health care provider whether you should take a calcium or vitamin D supplement to lower your risk for osteoporosis.  Menopause may have certain physical symptoms and risks.  Hormone replacement therapy may reduce some of these symptoms and risks. Talk to your health care provider about whether hormone replacement therapy is right for you. Follow these instructions at home:  Schedule regular health, dental, and eye exams.  Stay current with your immunizations.  Do not use any tobacco products including cigarettes, chewing tobacco, or electronic cigarettes.  If you are pregnant, do not drink alcohol.  If you are breastfeeding, limit how much and how often you drink alcohol.  Limit alcohol intake to no more than 1 drink per day for nonpregnant women. One drink equals  12 ounces of beer, 5 ounces of wine, or 1 ounces of hard liquor.  Do not use street drugs.  Do not share needles.  Ask your health care provider for help if you need support or information about quitting drugs.  Tell your health care provider if you often feel depressed.  Tell your health care provider if you have ever been abused or do not feel safe at home. This information is not intended to replace advice given to you by your health care provider. Make sure you discuss any questions you have with your health care provider. Document Released: 02/13/2011 Document Revised: 01/06/2016 Document Reviewed: 05/04/2015 Elsevier Interactive Patient Education  [employer].

## 2017-04-13 ENCOUNTER — Encounter: Payer: Self-pay | Admitting: *Deleted

## 2017-04-15 NOTE — Progress Notes (Signed)
Subjective:   Patricia White is a 46 y.o. female who presents with maternal aunt for an Initial Medicare Annual Wellness Visit.  Cardiac Risk Factors include: obesity (BMI >30kg/m2);sedentary lifestyle     Objective:    Today's Vitals   04/12/17 1115  BP: 132/74  Pulse: 91  Temp: 98.5 F (36.9 C)  TempSrc: Oral  SpO2: 97%  Weight: 182 lb (82.6 kg)  Height: 5\' 3"  (1.6 m)  PainSc: 0-No pain   Body mass index is 32.24 kg/m.   Current Medications (verified) Outpatient Encounter Prescriptions as of 04/12/2017  Medication Sig  . DEPAKOTE ER 250 MG 24 hr tablet TAKE 2 TABLETS (500 MG TOTAL) BY MOUTH 2 (TWO) TIMES DAILY.  Marland Kitchen. TEGRETOL 200 MG tablet TAKE 1 AND 1/2 TABLETS BY MOUTH TWICE A DAY  . TOPAMAX 25 MG tablet TAKE 3 TABLETS (75 MG TOTAL) BY MOUTH 2 (TWO) TIMES DAILY.  . [DISCONTINUED] diphenhydrAMINE (BENADRYL) 25 MG tablet Take 1 tablet (25 mg total) by mouth every 8 (eight) hours as needed. (Patient not taking: Reported on 04/12/2017)  . [DISCONTINUED] hydrocortisone cream 0.5 % Apply 1 application topically 2 (two) times daily. (Patient not taking: Reported on 04/12/2017)   No facility-administered encounter medications on file as of 04/12/2017.     Allergies (verified) Patient has no known allergies.   History: Past Medical History:  Diagnosis Date  . Asthma   . Cerebral palsy (HCC) Birth  . Mental retardation Birth  . Seizures (HCC)    History reviewed. No pertinent surgical history. Family History  Problem Relation Age of Onset  . Migraines Mother   . Cancer Mother        liver cancer  . Diabetes Maternal Aunt   . Hypertension Maternal Grandmother    Social History   Occupational History  .      disabled0   Social History Main Topics  . Smoking status: Never Smoker  . Smokeless tobacco: Never Used  . Alcohol use No  . Drug use: No  . Sexual activity: No    Tobacco Counseling Patient has never smoked and has no plans to start.  Activities of  Daily Living In your present state of health, do you have any difficulty performing the following activities: 04/12/2017  Hearing? N  Vision? N  Difficulty concentrating or making decisions? N  Walking or climbing stairs? Y  Dressing or bathing? Y  Doing errands, shopping? Y  Preparing Food and eating ? Y  Using the Toilet? Y  In the past six months, have you accidently leaked urine? Y  Do you have problems with loss of bowel control? N  Managing your Medications? N  Managing your Finances? N  Housekeeping or managing your Housekeeping? N  Some recent data might be hidden   Home Safety:  My home has a working smoke alarm:  Yes X 5           My home throw rugs have been fastened down to the floor or removed:  No. Discussed removing or tacking down. I have a non-slip surface or non-slip mats in the bathtub and shower:  Non-slip surface, shower chair        All my home's stairs have handrails, including any outdoor stairs  One level home with 5 outside steps with handrails          My home's floors, stairs and hallways are free from clutter, wires and cords:  Yes     I have animals  in my home  Yes, [place]  I wear seatbelts consistently:  Yes   Immunizations and Health Maintenance Immunization History  Administered Date(s) Administered  . Influenza Split 05/12/2011, 07/01/2012  . Influenza,inj,Quad PF,6+ Mos 06/11/2013, 06/25/2014, 06/01/2015, 06/20/2016, 04/12/2017  . Td 04/14/2002   Health Maintenance Due  Topic Date Due  . HIV Screening  12/18/1987  . PAP SMEAR  12/17/1993  . TETANUS/TDAP  04/14/2012   Patient has no sexual history Will not allow pap smear Patient will obtain TDaP at local pharmacy Flu vaccine administered today  Patient Care Team: Casey Burkitt, MD as PCP - General Daphine Deutscher, Dale Calvary, NP as Nurse Practitioner (Neurology) Darrol Angel, NP for Neurology  Indicate any recent Medical Services you may have received from other than [facility]  providers in the past year (date may be approximate).     Assessment:   This is a routine wellness examination for Patricia White.   Dietary issues and exercise activities discussed: Current Exercise Habits: The patient does not participate in regular exercise at present, Exercise limited by: orthopedic condition(s);neurologic condition(s)  Goals    . Exercise 5x per week (5-10 min per time)          Walking      Depression Screen PHQ 2/9 Scores 04/12/2017 06/20/2016 06/01/2015 06/25/2014  PHQ - 2 Score 0 0 0 0  PHQ- 9 Score - 0 - -    Fall Risk Fall Risk  04/12/2017 06/01/2015 06/25/2014  Falls in the past year? No No No  Risk for fall due to : Impaired balance/gait;Impaired mobility;Impaired vision;Medication side effect - -   TUG Test:  Done in 20 seconds. Patient used one hand to push out of chair and to sit back down. 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 Pronounced left sided limp noted. Patient holds left arm close to body at all times. Falls prevention discussed in detail and literature given.  Cognitive Function: Mini-Cog  Passed with score 3/5 Patient unable to draw clock as she does not know  numbers  Screening Tests Health Maintenance  Topic Date Due  . HIV Screening  12/18/1987  . PAP SMEAR  12/17/1993  . TETANUS/TDAP  04/14/2012  . INFLUENZA VACCINE  Completed      Plan:   Encouraged aunt to schedule OV to meet new PCP  I have personally reviewed and noted the following in the patient's chart:   . Medical and social history . Use of alcohol, tobacco or illicit drugs  . Current medications and supplements . Functional ability and status . Nutritional status . Physical activity . Advanced directives . List of other physicians . Hospitalizations, surgeries, and ER visits in previous 12 months . Vitals . Screenings to include cognitive, depression, and falls . Referrals and appointments  In addition, I have reviewed and  discussed with patient certain preventive protocols, quality metrics, and best practice recommendations. A written personalized care plan for preventive services as well as general preventive health recommendations were provided to patient.    I have reviewed this visit and discussed with Alecia Lemming, RN, BSN, and agree with her above documentation.  Dani Gobble, MD [facility], PGY-3

## 2017-04-25 ENCOUNTER — Other Ambulatory Visit: Payer: Self-pay | Admitting: Diagnostic Neuroimaging

## 2017-05-06 NOTE — Progress Notes (Signed)
[facility]  PATIENT: Patricia White DOB: 1971-05-21  REASON FOR VISIT: Follow-up for seizure disorder, moderate mental retardation, cerebral palsy, left hemiparesis HISTORY FROM: Grandmother   HISTORY OF PRESENT ILLNESS:UPDATE 09/24/2018CM Patricia White, 46 year old female returns for follow-up with history of seizure disorder, moderate MR, congenital left hemiparesis abnormality of gait and history of cerebral palsy. She is currently on brand Tegretol and Depakote and Topamax. She still has some intermittent myoclonus 1-2 times a week. Appetite is good she sleeps well. She wears an AFO to her left lower extremity. She has not had any falls in the last year. She has not had any interval medical issues. She returns for reevaluation.   UPDATE 7/12/17CM: 46 year old female with moderate MR, congenital left hemiparesis, here for yearly follow up of seizure d/o. Last seizure in 2009. Still with intermittent myoclonus in the AM, 1-2 times per week. Tolerating meds without side effects needs refills and labs. She also needs a new AFO to her left lower extremity. No recent falls. No new medical issues since last seen. She returns for reevaluation  PRIOR HPI (07/23/12, CM): 46 year old black female returns today for followup. She was last seen in our office 07/17/11. She is a previous pt of Dr. Sharene Skeans. 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 She returns today with her grandmother. [facility]. She has had no seizures in several years. She is currently on 3 different medications.She has had no falls. Continues to have left lower extremity weakness with AFO in place. She intermittently wears a helmet but she does not have it on today. No hospitalizations or acute infections since last seen.    REVIEW OF SYSTEMS: Full 14 system review of systems performed and notable only for those listed, all others are neg:    Constitutional: neg  Cardiovascular: neg Ear/Nose/Throat: neg  Skin: neg Eyes: neg Respiratory: neg Gastroitestinal: neg  Hematology/Lymphatic: neg  Endocrine: neg Musculoskeletal: Walking difficulty Allergy/Immunology: neg Neurological: Seizure disorder Psychiatric: neg Sleep : neg   ALLERGIES: No Known Allergies  HOME MEDICATIONS: Outpatient Medications Prior to Visit  Medication Sig Dispense Refill  . DEPAKOTE ER 250 MG 24 hr tablet TAKE 2 TABLETS (500 MG TOTAL) BY MOUTH 2 (TWO) TIMES DAILY. 120 tablet 11  . TEGRETOL 200 MG tablet TAKE 1 AND 1/2 TABLETS BY MOUTH TWICE A DAY 90 tablet 11  . TOPAMAX 25 MG tablet TAKE 3 TABLETS (75 MG TOTAL) BY MOUTH 2 (TWO) TIMES DAILY. 180 tablet 0   No facility-administered medications prior to visit.     PAST MEDICAL HISTORY: Past Medical History:  Diagnosis Date  . Asthma   . Cerebral palsy (HCC) Birth  . Mental retardation Birth  . Seizures (HCC)     PAST SURGICAL HISTORY: History reviewed. No pertinent surgical history.  FAMILY HISTORY: Family History  Problem Relation Age of Onset  . Migraines Mother   . Cancer Mother        liver cancer  . Diabetes Maternal Aunt   . Hypertension Maternal Grandmother     SOCIAL HISTORY: Social History   Social History  . Marital status: Single    Spouse name: N/A  . Number of children: 0  . Years of education: dis.school   Occupational History  .      disabled0   Social History Main Topics  . Smoking status: Never Smoker  . Smokeless tobacco: Never Used  .  Alcohol use No  . Drug use: No  . Sexual activity: No   Other Topics Concern  . Not on file   Social History Narrative   Patient is right handed      Current Social History 04/13/2017           Patient lives with grandmother(Patricia White), maternal aunt Patricia White), female cousin and maternal uncle in one level home 04/13/2017   Transportation: Patient relies on aunt for transportation 04/13/2017    Important Relationships Family 04/13/2017    Pets: Patricia White, "Patricia White" 04/13/2017   Education / Work:  12 th grade/ Disabled 04/13/2017   Interests / Fun: Going to [employer], out for pizza, scary movies 04/13/2017   Religious / Personal Beliefs: Attends Non-Denominational Church 04/13/2017   L. Ducatte, RN, BSN                                                                                                      PHYSICAL EXAM  Vitals:   05/07/17 1025  BP: 136/88  Pulse: (!) 107  Weight: 182 lb 3.2 oz (82.6 kg)   Body mass index is 32.28 kg/m. Generalized: Well developed, Obese female in no acute distress  Head: normocephalic and atraumatic,. Oropharynx benign poor dentition  Neck: Supple,  Musculoskeletal: Left hemiparesis   Neurological examination   Mentation: Alert decreased fluency pleasant . Follows most commands.  Cranial nerve II-XII: Pupils were equal round reactive to light extraocular movements were full, visual field were full on confrontational test. Facial sensation and strength were normal. hearing was intact to finger rubbing bilaterally. Uvula tongue midline. head turning and shoulder shrug were normal and symmetric.Tongue protrusion into cheek strength was normal. Motor: Left arm and leg atrophy, congenital spastic left hemiparesis and left upper extremity 4 out of 5 left lower extremity 3 out of 5 with AFO, right side 5 out of 5  Sensory: normal and symmetric to light touch, pinprick, and Vibration,  Coordination: finger-nose-finger, normal on right unable to perform on the left Reflexes: Symmetric upper and lower, plantar responses were flexor bilaterally. Gait and Station: Rising up from seated position without assistance, wide based stance spastic left hemiparetic gait, no assistive device  DIAGNOSTIC DATA (LABS, IMAGING, TESTING)   ASSESSMENT AND PLAN 46 y.o. year old female has  past medical history of moderate MR, gait disorder, congenital left  hemiparesis and seizure disorder with last seizure occurring in 2009. Has episodes of myoclonus 1-2 times a week  Continue Depakote at current dose will refill brand  Continue Topamax at current dose will refill brand Continue carbamazepine at current dose will refill brand CBC CMP , to monitor adverse effects of Depakote and carbamazepine  VPA and CBZ today to monitor for therapeutic level/toxicity Call for increase in seizure activity Follow-up yearly and when necessary  Nilda Riggs, [facility], [facility], APRN  [facility] [address] [place], [place] [address] [phone]

## 2017-05-07 ENCOUNTER — Ambulatory Visit (INDEPENDENT_AMBULATORY_CARE_PROVIDER_SITE_OTHER): Payer: Medicare Other | Admitting: Nurse Practitioner

## 2017-05-07 ENCOUNTER — Encounter: Payer: Self-pay | Admitting: Nurse Practitioner

## 2017-05-07 VITALS — BP 136/88 | HR 107 | Wt 182.2 lb

## 2017-05-07 DIAGNOSIS — G809 Cerebral palsy, unspecified: Secondary | ICD-10-CM

## 2017-05-07 DIAGNOSIS — F71 Moderate intellectual disabilities: Secondary | ICD-10-CM

## 2017-05-07 DIAGNOSIS — Z5181 Encounter for therapeutic drug level monitoring: Secondary | ICD-10-CM

## 2017-05-07 DIAGNOSIS — G40909 Epilepsy, unspecified, not intractable, without status epilepticus: Secondary | ICD-10-CM

## 2017-05-07 DIAGNOSIS — G8194 Hemiplegia, unspecified affecting left nondominant side: Secondary | ICD-10-CM

## 2017-05-07 MED ORDER — TEGRETOL 200 MG PO TABS: ORAL_TABLET | ORAL | 11 refills | Status: DC

## 2017-05-07 MED ORDER — TOPAMAX 25 MG PO TABS: 75.0000 mg | ORAL_TABLET | Freq: Two times a day (BID) | ORAL | 11 refills | Status: DC

## 2017-05-07 MED ORDER — DEPAKOTE ER 250 MG PO TB24: 500.0000 mg | ORAL_TABLET | Freq: Two times a day (BID) | ORAL | 11 refills | Status: DC

## 2017-05-07 NOTE — Patient Instructions (Signed)
Continue Depakote at current dose will refill brand  Continue Topamax at current dose will refill brand Continue carbamazepine at current dose will refill brand CBC CMP ,  VPA and CBZ today Follow-up yearly and when necessary

## 2017-05-07 NOTE — Progress Notes (Signed)
Topamax, Tegretol, Depakote, all brand necessary refill Rx faxed to [facility], [address]

## 2017-05-08 ENCOUNTER — Telehealth: Payer: Self-pay | Admitting: *Deleted

## 2017-05-08 LAB — COMPREHENSIVE METABOLIC PANEL
A/G RATIO: 1 — AB (ref 1.2–2.2)
ALT: 10 IU/L (ref 0–32)
AST: 22 IU/L (ref 0–40)
Albumin: 4.1 g/dL (ref 3.5–5.5)
Alkaline Phosphatase: 78 IU/L (ref 39–117)
BILIRUBIN TOTAL: 0.2 mg/dL (ref 0.0–1.2)
BUN/Creatinine Ratio: 7 — ABNORMAL LOW (ref 9–23)
BUN: 5 mg/dL — AB (ref 6–24)
CALCIUM: 8.5 mg/dL — AB (ref 8.7–10.2)
CHLORIDE: 102 mmol/L (ref 96–106)
CO2: 21 mmol/L (ref 20–29)
Creatinine, Ser: 0.71 mg/dL (ref 0.57–1.00)
GFR calc Af Amer: 120 mL/min/{1.73_m2} (ref >59)
GFR calc non Af Amer: 104 mL/min/{1.73_m2} (ref >59)
Globulin, Total: 4.3 g/dL (ref 1.5–4.5)
Glucose: 157 mg/dL — ABNORMAL HIGH (ref 65–99)
Potassium: 3.8 mmol/L (ref 3.5–5.2)
Sodium: 137 mmol/L (ref 134–144)
TOTAL PROTEIN: 8.4 g/dL (ref 6.0–8.5)

## 2017-05-08 LAB — CBC WITH DIFFERENTIAL/PLATELET
BASOS ABS: 0 10*3/uL (ref 0.0–0.2)
Basos: 0 %
EOS (ABSOLUTE): 0 10*3/uL (ref 0.0–0.4)
Eos: 0 %
Hematocrit: 34.2 % (ref 34.0–46.6)
Hemoglobin: 11.3 g/dL (ref 11.1–15.9)
IMMATURE GRANS (ABS): 0 10*3/uL (ref 0.0–0.1)
IMMATURE GRANULOCYTES: 0 %
LYMPHS: 32 %
Lymphocytes Absolute: 1.3 10*3/uL (ref 0.7–3.1)
MCH: 29.7 pg (ref 26.6–33.0)
MCHC: 33 g/dL (ref 31.5–35.7)
MCV: 90 fL (ref 79–97)
Monocytes Absolute: 0.3 10*3/uL (ref 0.1–0.9)
Monocytes: 7 %
NEUTROS PCT: 61 %
Neutrophils Absolute: 2.6 10*3/uL (ref 1.4–7.0)
PLATELETS: 297 10*3/uL (ref 150–379)
RBC: 3.8 x10E6/uL (ref 3.77–5.28)
RDW: 14.4 % (ref 12.3–15.4)
WBC: 4.2 10*3/uL (ref 3.4–10.8)

## 2017-05-08 LAB — CARBAMAZEPINE LEVEL, TOTAL: CARBAMAZEPINE LVL: 9 ug/mL (ref 4.0–12.0)

## 2017-05-08 LAB — VALPROIC ACID LEVEL: Valproic Acid Lvl: 62 ug/mL (ref 50–100)

## 2017-05-08 NOTE — Telephone Encounter (Signed)
Attempted to reach patient's grandmother, guardian. She wasn't at home. Will call back later.

## 2017-05-08 NOTE — Telephone Encounter (Signed)
Spoke with patient's grandmother, [name] and informed her the patient's labs look good. She verbalized understanding, appreciation.

## 2017-05-23 ENCOUNTER — Other Ambulatory Visit: Payer: Self-pay | Admitting: Diagnostic Neuroimaging

## 2017-05-24 ENCOUNTER — Telehealth: Payer: Self-pay | Admitting: *Deleted

## 2017-05-24 NOTE — Telephone Encounter (Signed)
Appt made for 06/12/2017 to meet new PCP and discuss need for Personal Care Services. Kinnie Feil, RN, BSN

## 2017-05-24 NOTE — Telephone Encounter (Signed)
Thank you! If you put it in my box, I will put a sticky to remind myself to complete and then return to you after the visit.

## 2017-05-24 NOTE — Telephone Encounter (Signed)
Perfect. Thank you!

## 2017-05-24 NOTE — Telephone Encounter (Signed)
I placed in your box with appt date and note to return to my box. Thanks!

## 2017-05-29 NOTE — Progress Notes (Signed)
I reviewed note and agree with plan.   Nhyla Nappi R. Jamarr Treinen, MD  Certified in Neurology, Neurophysiology and Neuroimaging  [facility] [address], [address] [place], [place] [address] [phone]   

## 2017-06-12 ENCOUNTER — Ambulatory Visit: Payer: Self-pay | Admitting: Internal Medicine

## 2018-01-08 ENCOUNTER — Telehealth: Payer: Self-pay | Admitting: Internal Medicine

## 2018-01-08 NOTE — Telephone Encounter (Signed)
I received forms for personal care services, but patient needs visit within 90 days. Please call family to schedule visit. Thank you.  Dani Gobble, MD [facility], PGY-3

## 2018-01-08 NOTE — Telephone Encounter (Signed)
Pt guardian informed and pt scheduled for an appt. Joan Avetisyan Bruna Potter, CMA

## 2018-01-15 ENCOUNTER — Other Ambulatory Visit: Payer: Self-pay | Admitting: Diagnostic Neuroimaging

## 2018-01-23 ENCOUNTER — Ambulatory Visit: Payer: Self-pay | Admitting: Internal Medicine

## 2018-02-26 ENCOUNTER — Encounter: Payer: Self-pay | Admitting: Family Medicine

## 2018-02-26 ENCOUNTER — Ambulatory Visit (INDEPENDENT_AMBULATORY_CARE_PROVIDER_SITE_OTHER): Payer: Medicare Other | Admitting: Family Medicine

## 2018-02-26 ENCOUNTER — Other Ambulatory Visit: Payer: Self-pay

## 2018-02-26 VITALS — BP 128/62 | HR 78 | Temp 98.4°F | Ht 63.0 in | Wt 177.0 lb

## 2018-02-26 DIAGNOSIS — G40909 Epilepsy, unspecified, not intractable, without status epilepticus: Secondary | ICD-10-CM

## 2018-02-26 DIAGNOSIS — Z124 Encounter for screening for malignant neoplasm of cervix: Secondary | ICD-10-CM

## 2018-02-26 DIAGNOSIS — Z0000 Encounter for general adult medical examination without abnormal findings: Secondary | ICD-10-CM | POA: Diagnosis not present

## 2018-02-26 DIAGNOSIS — G809 Cerebral palsy, unspecified: Secondary | ICD-10-CM | POA: Diagnosis not present

## 2018-02-26 DIAGNOSIS — Z23 Encounter for immunization: Secondary | ICD-10-CM

## 2018-02-26 MED ORDER — TETANUS-DIPHTH-ACELL PERTUSSIS 5-2.5-18.5 LF-MCG/0.5 IM SUSP: 0.5000 mL | Freq: Once | INTRAMUSCULAR | 0 refills | Status: AC

## 2018-02-26 NOTE — Progress Notes (Signed)
Subjective   Patient ID: Patricia White    DOB: 02/04/1971, 47 y.o. female   MRN: [id number]  CC: "Annual physical exam"  HPI: Patricia Annaatasha C Demaree is a 47 y.o. female who presents to clinic today for the following:  Annual physical exam: Patient here today for annual physical.  No concerns today.  Interview received by patient's mother who is primary caregiver based on underlying disability secondary to cerebral palsy and mental retardation.  Patient not interested in HIV he does not sexually active.  She is overdue for her Pap smear.  She is without fevers or chills, chest pain, shortness of breath, nausea or vomiting, abdominal pain, dysuria, melena or hematochezia, constipation or diarrhea.  Need for cervical cancer screening: Patient has not received a Pap smear since 1992.  She would not be willing to undergo Pap smear in the clinic and would likely need sedation.  She is without vaginal bleeding or discharge per caregiver.  Cerebral palsy: Patient with underlying infantile cerebral palsy and left-sided weakness with some contracture.  She is followed by neurology and is stable on Depakote, Topamax, and carbamazepine.  She does have tonic clonic seizures primarily on right extremity 1-2 times per week.  Patient's primary caregiver is mother who states she is unable to continue providing adequate care for patient's ADLs.  She is requesting referral for home aide.  ROS: see HPI for pertinent.    PMFSH: Infantile cerebral palsy, seizure disorder with left hemiparesis, obesity, asthma. Smoking status reviewed. Medications reviewed.  Objective   BP 128/62   Pulse 78   Temp 98.4 F (36.9 C) (Oral)   Ht 5\' 3"  (1.6 m)   Wt 177 lb (80.3 kg)   LMP 12/27/2017   SpO2 98%   BMI 31.35 kg/m  Vitals and nursing note reviewed.  General: well nourished, well developed, NAD with non-toxic appearance HEENT: normocephalic, atraumatic, moist mucous membranes, PERRLA, EOMI Neck: supple, non-tender  without lymphadenopathy Cardiovascular: regular rate and rhythm without murmurs, rubs, or gallops Lungs: clear to auscultation bilaterally with normal work of breathing Abdomen: soft, non-tender, non-distended, normoactive bowel sounds Skin: warm, dry, no rashes or lesions, cap refill < 2 seconds Extremities: warm and well perfused, normal tone, no edema Neuro: CNII-XII intact with exception to 1/5 motor strength to left upper and lower extremity with contracture of left hand  Assessment & Plan   Encounter for Papanicolaou smear for cervical cancer screening Overdue.  Need sedation due to underlying intellectual disability and inability to cooperate during exam. - Ambulatory referral to gynecology to evaluate for Pap smear  Encounter for annual physical exam No acute issues.  Patient in good health.  Non-smoker.  Declined lifetime screen for HIV. - RTC in 1 year or sooner if needed - Given Tdap booster prescription  Infantile cerebral palsy (HCC) Chronic.  At baseline.  Followed by neurology. - Continue seeing neurology and follow recommendations for antiepileptics  Orders Placed This Encounter  Procedures  . Ambulatory referral to Gynecology    Referral Priority:   Routine    Referral Type:   Consultation    Referral Reason:   Specialty Services Required    Requested Specialty:   Gynecology    Number of Visits Requested:   1  . Ambulatory referral to Home Health    Referral Priority:   Routine    Referral Type:   Home Health Care    Referral Reason:   Specialty Services Required    Requested Specialty:  Home Health Services    Number of Visits Requested:   1   Meds ordered this encounter  Medications  . Tdap (BOOSTRIX) 5-2.5-18.5 LF-MCG/0.5 injection    Sig: Inject 0.5 mLs into the muscle once for 1 dose.    Dispense:  0.5 mL    Refill:  0    Durward Parcel, DO [facility], PGY-3 02/26/2018, 12:26 PM

## 2018-02-26 NOTE — Assessment & Plan Note (Signed)
Overdue.  Need sedation due to underlying intellectual disability and inability to cooperate during exam. - Ambulatory referral to gynecology to evaluate for Pap smear

## 2018-02-26 NOTE — Patient Instructions (Signed)
Thank you for coming in to see Patricia White today. Please see below to review our plan for today's visit.  1.  Take the prescription for the tetanus booster to your local pharmacy to receive the vaccination.  This will cover you for the next 10 years. 2.  I placed a referral for you to be evaluated by a gynecologist for your Pap smear.  They will contact you to set up the appointment. 3.  I also placed a referral for a home health aide.  They will call you within the week to schedule the home assessment. 4.  I will see you again in 1 year or sooner if needed.  Please call the clinic at [phone] if your symptoms worsen or you have any concerns. It was our pleasure to serve you.  Durward Parcelavid Yamili Lichtenwalner, DO [facility], PGY-3

## 2018-02-26 NOTE — Assessment & Plan Note (Signed)
No acute issues.  Patient in good health.  Non-smoker.  Declined lifetime screen for HIV. - RTC in 1 year or sooner if needed - Given Tdap booster prescription

## 2018-02-26 NOTE — Assessment & Plan Note (Addendum)
Chronic.  At baseline.  Followed by neurology. - Continue seeing neurology and follow recommendations for antiepileptics - Ambulatory referral to home health for home aide

## 2018-03-19 ENCOUNTER — Other Ambulatory Visit: Payer: Self-pay | Admitting: Nurse Practitioner

## 2018-03-27 ENCOUNTER — Encounter: Payer: Self-pay | Admitting: Obstetrics and Gynecology

## 2018-05-05 ENCOUNTER — Other Ambulatory Visit: Payer: Self-pay | Admitting: Nurse Practitioner

## 2018-05-06 NOTE — Telephone Encounter (Signed)
Ms. Patricia White was scheduled for Wednesday but she called and cancelled via automated service. Was unsure on how many refills to give. Please advise.

## 2018-05-08 ENCOUNTER — Ambulatory Visit: Payer: Self-pay | Admitting: Nurse Practitioner

## 2018-05-10 ENCOUNTER — Encounter: Payer: Self-pay | Admitting: Obstetrics and Gynecology

## 2018-05-31 ENCOUNTER — Other Ambulatory Visit: Payer: Self-pay | Admitting: Nurse Practitioner

## 2018-06-15 ENCOUNTER — Other Ambulatory Visit: Payer: Self-pay | Admitting: Nurse Practitioner

## 2018-06-17 NOTE — Telephone Encounter (Signed)
Called and spoke with patient's grandmother and guardian, [name] (on [place]). Advised her this RN has a refill request on patient's Depakote, however she doesn't have a FU scheduled.  Buren Kos that this RN needs to make an appt then can refill depakote until patient comes in for FU. [name] stated she has to have time to get a ride and doesn't want an early morning appointment. This RN scheduled her with Shanda Bumps NP and informed grandmother the patient will be seeing a different NP.  She verbalized understanding, stated the patient needs her medicaiton. This RN informed her Depakote will be refilled until her FU in Dec. This RN asked her to wrote down FU date/time; [name] stated she did and verified it was correct. She verbalized appreciation for call.

## 2018-07-08 ENCOUNTER — Other Ambulatory Visit: Payer: Self-pay

## 2018-07-08 ENCOUNTER — Emergency Department (HOSPITAL_COMMUNITY)
Admission: EM | Admit: 2018-07-08 | Discharge: 2018-07-09 | Disposition: A | Discharge status: Home / Self Care | Payer: Medicare Other | Attending: Emergency Medicine | Admitting: Emergency Medicine

## 2018-07-08 ENCOUNTER — Other Ambulatory Visit: Payer: Self-pay | Admitting: Nurse Practitioner

## 2018-07-08 ENCOUNTER — Encounter (HOSPITAL_COMMUNITY): Payer: Self-pay | Admitting: *Deleted

## 2018-07-08 DIAGNOSIS — F71 Moderate intellectual disabilities: Secondary | ICD-10-CM | POA: Diagnosis not present

## 2018-07-08 DIAGNOSIS — D7589 Other specified diseases of blood and blood-forming organs: Secondary | ICD-10-CM | POA: Diagnosis not present

## 2018-07-08 DIAGNOSIS — J45909 Unspecified asthma, uncomplicated: Secondary | ICD-10-CM | POA: Insufficient documentation

## 2018-07-08 DIAGNOSIS — R569 Unspecified convulsions: Secondary | ICD-10-CM | POA: Insufficient documentation

## 2018-07-08 DIAGNOSIS — G809 Cerebral palsy, unspecified: Secondary | ICD-10-CM | POA: Diagnosis not present

## 2018-07-08 DIAGNOSIS — Z79899 Other long term (current) drug therapy: Secondary | ICD-10-CM | POA: Diagnosis not present

## 2018-07-08 DIAGNOSIS — R000 Tachycardia, unspecified: Secondary | ICD-10-CM | POA: Diagnosis not present

## 2018-07-08 DIAGNOSIS — R410 Disorientation, unspecified: Secondary | ICD-10-CM | POA: Diagnosis not present

## 2018-07-08 LAB — CBC WITH DIFFERENTIAL/PLATELET
ABS IMMATURE GRANULOCYTES: 0.02 10*3/uL (ref 0.00–0.07)
Basophils Absolute: 0 10*3/uL (ref 0.0–0.1)
Basophils Relative: 1 %
Eosinophils Absolute: 0.1 10*3/uL (ref 0.0–0.5)
Eosinophils Relative: 1 %
HCT: 40 % (ref 36.0–46.0)
Hemoglobin: 12 g/dL (ref 12.0–15.0)
IMMATURE GRANULOCYTES: 0 %
LYMPHS ABS: 2.6 10*3/uL (ref 0.7–4.0)
LYMPHS PCT: 41 %
MCH: 30.3 pg (ref 26.0–34.0)
MCHC: 30 g/dL (ref 30.0–36.0)
MCV: 101 fL — AB (ref 80.0–100.0)
MONO ABS: 0.5 10*3/uL (ref 0.1–1.0)
MONOS PCT: 7 %
NEUTROS ABS: 3.2 10*3/uL (ref 1.7–7.7)
NEUTROS PCT: 50 %
PLATELETS: 214 10*3/uL (ref 150–400)
RBC: 3.96 MIL/uL (ref 3.87–5.11)
RDW: 13.2 % (ref 11.5–15.5)
WBC: 6.3 10*3/uL (ref 4.0–10.5)
nRBC: 0 % (ref 0.0–0.2)

## 2018-07-08 LAB — BASIC METABOLIC PANEL
ANION GAP: 15 (ref 5–15)
BUN: 6 mg/dL (ref 6–20)
CO2: 13 mmol/L — AB (ref 22–32)
Calcium: 9 mg/dL (ref 8.9–10.3)
Chloride: 109 mmol/L (ref 98–111)
Creatinine, Ser: 0.87 mg/dL (ref 0.44–1.00)
GFR calc Af Amer: >60 mL/min (ref >60)
GLUCOSE: 133 mg/dL — AB (ref 70–99)
POTASSIUM: 3.8 mmol/L (ref 3.5–5.1)
Sodium: 137 mmol/L (ref 135–145)

## 2018-07-08 LAB — I-STAT BETA HCG BLOOD, ED (MC, WL, AP ONLY)

## 2018-07-08 LAB — CARBAMAZEPINE LEVEL, TOTAL: Carbamazepine Lvl: <2 ug/mL — ABNORMAL LOW (ref 4.0–12.0)

## 2018-07-08 LAB — VALPROIC ACID LEVEL: VALPROIC ACID LVL: 45 ug/mL — AB (ref 50.0–100.0)

## 2018-07-08 LAB — CBG MONITORING, ED: Glucose-Capillary: 112 mg/dL — ABNORMAL HIGH (ref 70–99)

## 2018-07-08 NOTE — ED Triage Notes (Signed)
Pt arrives from home via EMS. Witnessed seizure by family in her bed 5 minute tonic clonic seizure, post ictal on arrival. Seizure lasting 2 minutes en route with EMS, 5mg  IM Versed given. CBG 87,136/91, hr 108, 100% nrb. pt is on 3 different seizure meds (depakote, tegretol, Topamax), off of one of the meds, Tegretol, for 3 days.  IV established in the right AC.

## 2018-07-08 NOTE — Telephone Encounter (Signed)
Refilled x 1 month with note to pharmacy, re: must keep FU on 08/05/18.

## 2018-07-08 NOTE — ED Notes (Signed)
Pt lives with her grandmother at home who witnessed seizure tonight. Says pt has not had seizure in about 4-5 years. She has been out of her tegretol, [facility] but she was unable to go pick it up. She is followed by Dr. Daphine DeutscherMartin with neurology, d/t see her on Dec 23rd. Pt opens eyes and moans to noxious stimuli. Oxygen reduced from NRB to nasal cannula.

## 2018-07-08 NOTE — ED Provider Notes (Signed)
[facility] EMERGENCY DEPARTMENT Provider Note   CSN: [id number] Arrival date & time: 07/08/18  2249     History   Chief Complaint Chief Complaint  Patient presents with  . Seizures    HPI Patricia White is a 47 y.o. female.  The history is provided by a relative. The history is limited by the condition of the patient (Developmental delay).  Seizures    She has history of asthma, cerebral palsy, mental retardation, seizure disorder, left hemiparesis and came in by ambulance following a seizure at home.  Seizure was generalized and grandmother thinks it lasted about 10 minutes.  She had a second seizure in the ambulance coming in which lasted about 2 minutes.  She normally takes valproic acid, carbamazepine, and topiramate for seizures, but ran out of her carbamazepine 3 days ago.  Grandmother had gotten a call from pharmacy that the medication was in, but she was unable to get to the pharmacy to get the prescription filled.  Last seizure prior to tonight was about 4 years ago.  Past Medical History:  Diagnosis Date  . Asthma   . Cerebral palsy (HCC) Birth  . Mental retardation Birth  . Seizures [facility])     Patient Active Problem List   Diagnosis Date Noted  . Encounter for annual physical exam 02/26/2018  . Encounter for Papanicolaou smear for cervical cancer screening 02/26/2018  . Seizure disorder (HCC) 02/23/2016  . Left hemiparesis (HCC) 02/23/2015  . 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 Obesity (BMI 30.0-34.9) 10/13/2009  . Infantile cerebral palsy (HCC) 10/11/2006    History reviewed. No pertinent surgical history.   OB History   None      Home Medications    Prior to Admission medications   Medication Sig Start Date End Date Taking? Authorizing Provider  DEPAKOTE ER 250 MG 24 hr tablet TAKE 2 TABLETS BY MOUTH TWICE A DAY 06/17/18   York Spaniel, MD  TEGRETOL 200 MG tablet TAKE 1 AND 1/2 TABLETS BY MOUTH TWICE A DAY 07/08/18    York Spaniel, MD  TOPAMAX 25 MG tablet TAKE 3 TABLETS BY MOUTH TWICE A DAY 03/19/18   York Spaniel, MD    Family History Family History  Problem Relation Age of Onset  . Migraines Mother   . Cancer Mother        liver cancer  . Diabetes Maternal Aunt   . Hypertension Maternal Grandmother     Social History Social History   Tobacco Use  . Smoking status: Never Smoker  . Smokeless tobacco: Never Used  Substance Use Topics  . Alcohol use: No  . Drug use: No     Allergies   Patient has no known allergies.   Review of Systems Review of Systems  Unable to perform ROS: Other  Neurological: Positive for seizures.     Physical Exam Updated Vital Signs BP 121/75 (BP Location: Left Arm)   Pulse (!) 114   Temp 97.6 F (36.4 C) (Rectal)   Resp (!) 26   SpO2 100%   Physical Exam  Nursing note and vitals reviewed.  47 year old female, resting comfortably and in no acute distress. Vital signs are significant for elevated heart rate and respiratory rate. Oxygen saturation is 100%, which is normal. Head is normocephalic and atraumatic. PERRLA, EOMI. Oropharynx is clear. Neck is nontender and supple without adenopathy or JVD. Back is nontender and there is no CVA tenderness. Lungs are clear without rales,  wheezes, or rhonchi. Chest is nontender. Heart has regular rate and rhythm without murmur. Abdomen is soft, flat, nontender without masses or hepatosplenomegaly and peristalsis is normoactive. Extremities have no cyanosis or edema, full range of motion is present. Skin is warm and dry without rash. Neurologic: Awake but nonverbal, cranial nerves are intact.  Left hemiparesis present.  ED Treatments / Results  Labs (all labs ordered are listed, but only abnormal results are displayed) Labs Reviewed  CARBAMAZEPINE LEVEL, TOTAL - Abnormal; Notable for the following components:      Result Value   Carbamazepine Lvl <2.0 (*)    All other components within normal  limits  VALPROIC ACID LEVEL - Abnormal; Notable for the following components:   Valproic Acid Lvl 45 (*)    All other components within normal limits  BASIC METABOLIC PANEL - Abnormal; Notable for the following components:   CO2 13 (*)    Glucose, Bld 133 (*)    All other components within normal limits  CBC WITH DIFFERENTIAL/PLATELET - Abnormal; Notable for the following components:   MCV 101.0 (*)    All other components within normal limits  CBG MONITORING, ED - Abnormal; Notable for the following components:   Glucose-Capillary 112 (*)    All other components within normal limits  I-STAT BETA HCG BLOOD, ED ([facility], [facility], [facility] ONLY)    EKG EKG Interpretation  Date/Time:  Monday July 08 2018 22:57:05 EST Ventricular Rate:  97 PR Interval:    QRS Duration: 83 QT Interval:  366 QTC Calculation: 465 R Axis:   70 Text Interpretation:  Sinus rhythm Abnormal R-wave progression, early transition Baseline wander in lead(s) V3 When compared with ECG of 01/10/2001, No significant change was found Confirmed by Dione BoozeGlick, Deangelo Berns ([id number]) on 07/08/2018 11:03:00 PM  Procedures Procedures   Medications Ordered in ED Medications  carbamazepine (TEGRETOL) tablet 400 mg (400 mg Oral Given 07/09/18 0046)  LORazepam (ATIVAN) injection 1 mg (1 mg Intravenous Given 07/09/18 0051)     Initial Impression / Assessment and Plan / ED Course  I have reviewed the triage vital signs and the nursing notes.  Pertinent labs & imaging results that were available during my care of the patient were reviewed by me and considered in my medical decision making (see chart for details).  Seizure and patient with known seizure disorder.  Probably related to stopping carbamazepine.  Will check blood levels of carbamazepine and valproic acid.  Old records are reviewed, confirming outpatient management of seizure disorder, no prior ED visits for seizures.  Labs confirm undetectable carbamazepine level and she is given a  dose of oral carbamazepine.  CO2 is low, consistent with recent seizure.  Mild macrocytosis is present of uncertain significance.  While in the ED, patient was observed to have some minor twitching.  She was given a dose of lorazepam and has been observed for 2 hours following that with no recurrent seizures and no recurrent twitching.  Grandmother states that she will be picking up the prescription for carbamazepine from pharmacy before going home after discharge from the ED.  She grandmother is advised to make sure that patient never misses a dose of any of her anticonvulsants.  Follow-up with her PCP and with her neurologist.  Final Clinical Impressions(s) / ED Diagnoses   Final diagnoses:  Seizure (HCC)  Macrocytosis without anemia    ED Discharge Orders    None       Dione BoozeGlick, Courtenay Hirth, MD 07/09/18 [phone]

## 2018-07-08 NOTE — ED Notes (Signed)
ED Provider at bedside. 

## 2018-07-09 MED ORDER — CARBAMAZEPINE 200 MG PO TABS
400.0000 mg | ORAL_TABLET | Freq: Once | ORAL | Status: AC
  Administered 2018-07-09: 400 mg via ORAL
  Filled 2018-07-09: qty 2

## 2018-07-09 MED ORDER — LORAZEPAM 2 MG/ML IJ SOLN
1.0000 mg | Freq: Once | INTRAMUSCULAR | Status: AC
  Administered 2018-07-09: 1 mg via INTRAVENOUS
  Filled 2018-07-09: qty 1

## 2018-07-09 NOTE — Discharge Instructions (Addendum)
Make sure she never misses a dose of any of her seizure medications.

## 2018-07-10 ENCOUNTER — Other Ambulatory Visit: Payer: Self-pay | Admitting: Neurology

## 2018-07-12 ENCOUNTER — Other Ambulatory Visit: Payer: Self-pay | Admitting: *Deleted

## 2018-07-12 NOTE — Patient Outreach (Addendum)
[facility]N) Care Management  07/12/2018  Leveda Annaatasha C Zarr 12/18/1970 [id number]  TELEPHONE SCREENING Referral date:07/12/18 Referral source: [facility] Utilization Management Referral reason:Transportation assistance Insurance: [employer]  Unable to complete full telephone assessment due to patient's mental handicap. Since there are no retrievable documents related to personal health information release or legal guardian form, obtained verbal permission from [name] to speak with her grandmother.   Subjective:  Spoke to patient's grandmother who is Kaylen's legal guardian as [name] has both physical and mental handicaps. Malena CatholicLucille Keeler,patient's grandmother, states she is seeking help for [name] for transportation to medical appointment's. Mrs. Mila PalmerVinson says she has not used public or paratransit services such as [employer]) in the past.  Malena CatholicLucille also states she has no additional help with Arihanna's care other than her daughter (Hazelee's aunt) and it is getting more difficult for her to provide the care she needs. Malena CatholicLucille states Anzley's mother died several years ago. She is receptive to discussing possible personal aid assist in the home. She says [name] is ambulatory with a left leg brace (due to left sided paralysis) and uses no other assistive device. Other than an elevated commode seat, she says they have no other adaptive equipment for Donnis in the home.  Upon chart review, this RNCM noted that [name] was seen in the emergency department due to a seizure on 07/08/18.  Her carbamazepine level was undetectable and Mrs Mila PalmerVinson reported to the ED MD that they ran out of Tegretol three days prior as she was not able to get to the drugstore to pick it up. She says she has never utilized mail order for Progress Energyatasha's medications.   Plan: Will make referral to [facility] Care Management social work for  evaluation for transportation assistance and in-home personal aid help. Will make referral to [facility] Care Management pharmacist to investigate mail order medication services. [name] does not qualify for health coach services as she does not have heart failure, diabetes, atrial fibrillation or HTN.   Bary RichardJanet S. Yania Bogie RN,CCM,CDE [facility] Care Management Coordinator Office Phone [phone] Office Fax [phone]

## 2018-07-16 ENCOUNTER — Telehealth: Payer: Self-pay | Admitting: Nurse Practitioner

## 2018-07-16 ENCOUNTER — Ambulatory Visit: Payer: Self-pay | Admitting: Pharmacist

## 2018-07-16 ENCOUNTER — Other Ambulatory Visit: Payer: Self-pay

## 2018-07-16 NOTE — Telephone Encounter (Signed)
Pts aunt delores on DPR requesting refills for DEPAKOTE ER 250 MG 24 hr [facility]

## 2018-07-16 NOTE — Patient Outreach (Signed)
[facility]N) Care Management  07/16/2018  Leveda Annaatasha C Dhami 04/14/1971 [id number]   Initial outreach to patient's legal guardian, Boone MasterLucille Snellgrove, regarding social work referral for transportation resources and personal care services.  BSW educated [name] about transportation benefit through [employer].  BSW agreed to mail information to her regarding how to use this service. BSW educated Ms. Mclean about [employer] services.  BSW discussed the application and eligibility process with her.  Application was completed via phone and faxed to [employer] eligibility.  BSW informed [name] that she should be contacted by [employer] within the next week to schedule the face to face interview.  BSW talked with [name] about Personal Care Services.  BSW informed her of request/eligibility process.  BSW completed portion of request for services and faxed form to Dr. Tamala FothergillMcMullen's office for completion of remainder of form.  BSW informed [name] that this will be faxed to [facility] upon completion. BSW mailed [employer] and [employer] information to Ms.Parrales. BSW will follow up next week to ensure receipt of resources, ensure that she has been contacted by [employer], and provide update on request for personal care services.  Malachy ChamberAmber Egor Fullilove, BSW Social Worker [phone]

## 2018-07-16 NOTE — Telephone Encounter (Signed)
This has refill on prescription of Depakote and I asked if could get ready.  [facility] will.  Should be enough to get to appt 08/05/2018 with JV/NP.

## 2018-07-17 ENCOUNTER — Ambulatory Visit: Payer: Self-pay | Admitting: Pharmacist

## 2018-07-18 ENCOUNTER — Ambulatory Visit: Payer: Self-pay | Admitting: Pharmacist

## 2018-07-19 ENCOUNTER — Other Ambulatory Visit: Payer: Self-pay | Admitting: Pharmacist

## 2018-07-19 ENCOUNTER — Other Ambulatory Visit: Payer: Self-pay

## 2018-07-19 NOTE — Patient Outreach (Signed)
[facility]N) Care Management  07/19/2018  Patricia White 05/18/1971 [id number]   BSW received completed request for Personal Care Services from Dr. Abelardo DieselMcMullen, however, [facility] will not process this request because she must have been seen by provider within 30 days of request for services.  Her last appointment was on 02/26/18.  BSW called patient's guardian, [name], to inform her of this.  BSW offered to conduct three way call with her to schedule appointment but she verbalized understanding of why this appointment needs to occur and said that she would contact provider.  BSW will follow up with her again next week.   [name], BSW Social Worker [phone]

## 2018-07-19 NOTE — Patient Outreach (Addendum)
[facility]N) Care Management  [facility]   07/19/2018  Patricia White 07/06/1971 [id number]   Reason for referral:  Medication management/adherence  Referral source: [facility] RN Current insurance:UHC/Medicaid   PMHx: asthma, cerebral palsy, mental retardation, seizure disorder, left hemiparesis    HPI:  47 yo female recently admitted to ED for seizures likely due to subtherapeutic carbamazepine levels (<2).  Successful outreach to patient's grandmother Patricia White(Patricia White) who is Patricia White's legal guardian due to handicaps. DPR on file in [facility].  Grandmother states that they have transportation issues and often times do not make it to the pharmacy on time.  This has resulted in seizures and ED admissions.  Family would like to have medications delivered.  Reviewed medications with grandmother and list is updated in [facility]. She is agreeable to have all medications transferred [facility] on [address] Pharmacy provides free delivery and accepts patients insurance.  [facility]  [facility], however practice was closed (8-12p on Fridays [phone]).  Patient has all medications for this month so will plan to have medications switched for the next month's refill.  Patient's next appt is on 08/05/18 with Neurology.   Objective: Lab Results  Component Value Date   CREATININE 0.87 07/08/2018   CREATININE 0.71 05/07/2017   CREATININE 0.65 02/23/2016    No results found for: HGBA1C  Lipid Panel     Component Value Date/Time   CHOL 146 10/19/2009 1830   TRIG 85 10/19/2009 1830   HDL 55 10/19/2009 1830   CHOLHDL 2.7 Ratio 10/19/2009 1830   VLDL 17 10/19/2009 1830   LDLCALC 74 10/19/2009 1830    BP Readings from Last 3 Encounters:  07/09/18 99/72  02/26/18 128/62  05/07/17 136/88    No Known Allergies  Medications Reviewed Today    Reviewed by Danella MaiersPruitt, Jabez Molner D, RPH (Pharmacist) on 07/19/18 at 1223  Med List Status: <None>   Medication Order Taking? Sig Documenting Provider Last Dose Status Informant  DEPAKOTE ER 250 MG 24 hr tablet [id number] Yes TAKE 2 TABLETS BY MOUTH TWICE A DAY  Patient taking differently:  Take 500 mg by mouth 2 (two) times daily.    York SpanielWillis, Charles K, MD Taking Active Mother  TEGRETOL 200 MG tablet [id number] Yes TAKE 1 AND 1/2 TABLETS BY MOUTH TWICE A DAY  Patient taking differently:  Take 300 mg by mouth 2 (two) times daily.    York SpanielWillis, Charles K, MD Taking Active Mother  TOPAMAX 25 MG tablet [id number] Yes TAKE 3 TABLETS BY MOUTH TWICE A DAY  Patient taking differently:  Take 75 mg by mouth 2 (two) times daily.    York SpanielWillis, Charles K, MD Taking Active Mother          Assessment:  Drugs sorted by system:  Neurologic/Psychologic: Tegretol, Depakote ER, Topamax  Plan: -Since [facility] is closed (8-12p on Fridays), I will follow up on Monday, December 9th to have new prescriptions sent to [name] for next month's refills   Kieth BrightlyJulie Dattero Mariaeduarda Defranco, PharmD, [facility] Clinical Pharmacist [facility]  [phone]

## 2018-07-22 ENCOUNTER — Telehealth: Payer: Self-pay | Admitting: *Deleted

## 2018-07-22 ENCOUNTER — Ambulatory Visit: Payer: Self-pay | Admitting: Pharmacist

## 2018-07-22 ENCOUNTER — Other Ambulatory Visit: Payer: Self-pay | Admitting: Pharmacist

## 2018-07-22 MED ORDER — TEGRETOL 200 MG PO TABS: 300.0000 mg | ORAL_TABLET | Freq: Two times a day (BID) | ORAL | 0 refills | Status: DC

## 2018-07-22 NOTE — Telephone Encounter (Signed)
[facility] Chapman[facility]ie [facility].  Pt states does not have medication, but pharmacy states did receive.  ? Brand name.  I said will redo prescription as DAW Brand Name (tegretrol 300mg  po bid) suing 200mg  tablets to [facility].

## 2018-07-22 NOTE — Patient Outreach (Addendum)
[facility]N) Care Management  07/22/2018  Leveda Annaatasha C Lorah 09/08/1970 [id number]   Successful care coordination [facility] Beverely Low(Nancy Martin, NP).  [facility] that subsequent refills be called in to [name] [address] to allow patient to have medications delivered.  Telephonic coordinator stated that the office only does 7868-month refills at a time.  RN to [facility]  Patient has an appointment on 08/05/18 with Neurology .  Patient's caregiver states she does not have Tegretol prescription (has been out for 4 days) [facility].    [facility]: 11/26  Carbamazepine (tegretol-filled generic-DAW1 not specified) 07/18/18 Depakote brand name was filled 05/22/18 Topamax #90 supply  PLAN: -Message sent to Neurology RN to clarify medications -Will collaborate with patient, MD and pharmacy as necessary  Kieth BrightlyJulie Dattero Seneca Hoback, PharmD, [facility] Clinical Pharmacist [facility]  [phone]

## 2018-07-23 ENCOUNTER — Other Ambulatory Visit: Payer: Self-pay | Admitting: Nurse Practitioner

## 2018-07-23 NOTE — Telephone Encounter (Signed)
[facility] [place] [facility] [facility]  Her next fill available is 07-31-18.  Legal guardian is to call 2 days prior for them to get ready..  She verbalized understanding.  She then stated that pt has been out 4 days of the tegretol.  I told her that she needs to call former pharmacy and get the rest of prescription, as she felt she did not get whole fill.  She will call and if problem the pharmacy will call us.  She verbalized understanding.

## 2018-07-23 NOTE — Telephone Encounter (Signed)
I spoke to legal guardian, Boone MasterLucille, [place].  Pt has been taking seizure medications as ordered (on med list).  I relayed that I did escribe prescription to [name] (they deliver medication) Tegretol 300mg  po bid # 90 (BN).  I LMVM for them at [name] to let me know that they received message that they did get and will be delivering to pt today.  She has not had any seizures since last one (noted in epic-ED note).  Confirmed appt with us on 08-05-18 at 0945 arrive 0915.   [name] verbalized understanding.

## 2018-07-25 ENCOUNTER — Other Ambulatory Visit: Payer: Self-pay

## 2018-07-25 NOTE — Patient Outreach (Signed)
[facility]N) Care Management  07/25/2018  Patricia White 06/11/1971 [id number]   Follow up call to patient's legal guardian, Patricia White, regarding social work referral for transportation resources and personal care services.   Personal Care Services cannot be requested at this time because [facility] requires that patient's be seen by their provider within 90 days of request for services.  She is scheduled to see Dr. Abelardo DieselMcMullen on 09/05/18 therefore services can be requested after that. Patricia White has not yet been contacted by [employer] eligibility.   BSW provided her with contact information for [employer] and encouraged her to call next week if she has not been contacted by the end of this week.  Patricia White denied receipt of transportation resources([employer] and [employer] information) mailed to her.  BSW will follow up with her again next week to ensure she was able to schedule eligibility interview.  If resources have still not been received, BSW will resend them at that time.   Patricia White, BSW Social Worker [phone]

## 2018-07-29 ENCOUNTER — Other Ambulatory Visit: Payer: Self-pay | Admitting: Pharmacist

## 2018-07-29 ENCOUNTER — Ambulatory Visit: Payer: Self-pay | Admitting: Pharmacist

## 2018-07-29 NOTE — Patient Outreach (Addendum)
[facility]) Care Management  07/29/2018  Patricia White 02/26/1971 [id number]  Successful patient outreach call to patient's caregiver Patricia White([name]) with HIPAA identifiers verified x2.  Informed caregiver that new RXs have been called into [name].  Tegretol is eligible to be filled on 07/31/18 and Topamax & Depakote will be filled on 08/06/18.  She states Patricia Smilingatasha is doing well and has had no reported seizures in 1 week.  Will set up delivery of Tegretol for 07/31/18.  Caregiver requested morning time since she has an appt at 3:15pm that day.  Reminded patient of [facility].  Successful care coordination call to [name] (Dawn-Technician).  [name] will deliver brand name Tegretol on 07/31/18 in the early morning per patient request.  PLAN: -I will follow up with caregiver next week  Kieth BrightlyJulie Dattero Sindy Mccune, PharmD, [facility] Clinical Pharmacist [facility]  [phone]

## 2018-07-30 ENCOUNTER — Other Ambulatory Visit: Payer: Self-pay

## 2018-07-30 NOTE — Patient Outreach (Signed)
[facility]N) Care Management  07/30/2018  Patricia White 03/20/1971 [id number]   Follow up call to patient's guardian, Patricia White, to determine if she has been contacted by [employer] and to ensure receipt of resources mailed on 07/16/18.  Ms. Patricia White said that she has not been contacted by [employer] and did not receive resources mailed.  BSW left [facility]icemail [facility] and will follow up with Ms. Patricia White when return call is received.  BSW will mail [employer] and [employer] information to her again.   Malachy ChamberAmber Harshita White, BSW Social Worker [phone]

## 2018-07-31 ENCOUNTER — Ambulatory Visit: Payer: Medicare Other | Admitting: Family Medicine

## 2018-07-31 ENCOUNTER — Other Ambulatory Visit: Payer: Self-pay | Admitting: Neurology

## 2018-08-02 ENCOUNTER — Other Ambulatory Visit: Payer: Self-pay

## 2018-08-02 NOTE — Patient Outreach (Signed)
[facility]) Care Management  08/02/2018  Leveda Annaatasha C Elison 01/27/1971 [id number]   BSW received return call from [place]ney with SCAT eligibility.  She reported that she left a voicemail message for patient's guardian, Boone MasterLucille Hogan, on 07/22/18 and is awaiting a return call.  BSW contacted [name] and informed her of this.  BSW offered to conduct three way call to schedule eligibility interview but [name] said she prefers to wait until January to call.  BSW will follow up again next month.  Malachy ChamberAmber Americo Vallery, BSW Social Worker [phone]

## 2018-08-05 ENCOUNTER — Encounter: Payer: Self-pay | Admitting: Adult Health

## 2018-08-05 ENCOUNTER — Ambulatory Visit (INDEPENDENT_AMBULATORY_CARE_PROVIDER_SITE_OTHER): Payer: Medicare Other | Admitting: Adult Health

## 2018-08-05 VITALS — BP 143/94 | HR 110 | Ht 63.0 in | Wt 176.4 lb

## 2018-08-05 DIAGNOSIS — G40909 Epilepsy, unspecified, not intractable, without status epilepticus: Secondary | ICD-10-CM | POA: Diagnosis not present

## 2018-08-05 DIAGNOSIS — Z5181 Encounter for therapeutic drug level monitoring: Secondary | ICD-10-CM | POA: Diagnosis not present

## 2018-08-05 MED ORDER — DEPAKOTE ER 250 MG PO TB24: 500.0000 mg | ORAL_TABLET | Freq: Two times a day (BID) | ORAL | 3 refills | Status: DC

## 2018-08-05 MED ORDER — TEGRETOL 200 MG PO TABS: 300.0000 mg | ORAL_TABLET | Freq: Two times a day (BID) | ORAL | 3 refills | Status: DC

## 2018-08-05 MED ORDER — TOPAMAX 25 MG PO TABS: 75.0000 mg | ORAL_TABLET | Freq: Two times a day (BID) | ORAL | 3 refills | Status: DC

## 2018-08-05 NOTE — Patient Instructions (Addendum)
Your Plan:  Continue Tegretol, depakote and topamax.   We will check lab work today and call if anything is abnormal  Please call office with additional seizure episodes  Follow up in 6 months or call earlier if needed      Thank you for coming to see us at [facility]. I hope we have been able to provide you high quality care today.  You may receive a patient satisfaction survey over the next few weeks. We would appreciate your feedback and comments so that we may continue to improve ourselves and the health of our patients.

## 2018-08-05 NOTE — Progress Notes (Signed)
[facility]  PATIENT: Patricia White DOB: 03/25/1971  REASON FOR VISIT: Follow-up for seizure disorder, moderate mental retardation, cerebral palsy, left hemiparesis HISTORY FROM: Grandmother   HISTORY OF PRESENT ILLNESS:  Interval history 08/05/2018: Patient is being seen today due to seizure at home on 07/08/2018.  She is accompanied by her grandmother.  She did have a second seizure with EMS while being transported to ED.  Per ED notes, she continue to take valproic acid, carbamazepine and topiramate but had run out of her carbamazepine 3 days prior.  It was felt as though her seizure was likely running out of her carbamazepine as labs confirmed undetectable level.  She has not had any seizure activity in the past 4 years.  Valproic acid level 45. Since discharge home, denies any recent seizure activity. She continues on valproic acid, carbamazepine and topiramate without side effects. 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 She only wears this brace when she is out of the house.  Denies any recent falls.  Grandmother is concerned as her last menses was in October without menses in November or December. She is typically regular.  No further concerns at this time.     UPDATE 09/24/2018CM Ms. Mila White, 47 year old female returns for follow-up with history of seizure disorder, moderate MR, congenital left hemiparesis abnormality of gait and history of cerebral palsy. She is currently on brand Tegretol and Depakote and Topamax. She still has some intermittent myoclonus 1-2 times a week. Appetite is good she sleeps well. She wears an AFO to her left lower extremity. She has not had any falls in the last year. She has not had any interval medical issues. She returns for reevaluation.   UPDATE 7/12/17CM: 47 year old female with moderate MR, congenital left hemiparesis, here for yearly follow up of seizure d/o. Last seizure in 2009.  Still with intermittent myoclonus in the AM, 1-2 times per week. Tolerating meds without side effects needs refills and labs. She also needs a new AFO to her left lower extremity. No recent falls. No new medical issues since last seen. She returns for reevaluation  PRIOR HPI (07/23/12, CM): 47 year old black female returns today for followup. She was last seen in our office 07/17/11. She is a previous pt of Dr. Sharene SkeansHickling. 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 She returns today with her grandmother. [facility]. She has had no seizures in several years. She is currently on 3 different medications.She has had no falls. Continues to have left lower extremity weakness with AFO in place. She intermittently wears a helmet but she does not have it on today. No hospitalizations or acute infections since last seen.    REVIEW OF SYSTEMS: Full 14 system review of systems performed and notable only for those listed, all others are neg:  Constitutional: neg  Cardiovascular: neg Ear/Nose/Throat: neg  Skin: neg Eyes: neg Respiratory: neg Gastroitestinal: neg  Hematology/Lymphatic: neg  Endocrine: neg Musculoskeletal: Walking difficulty Allergy/Immunology: neg Neurological: Seizure disorder Psychiatric: Behavior problem Sleep : Snoring   ALLERGIES: No Known Allergies  HOME MEDICATIONS: Outpatient Medications Prior to Visit  Medication Sig Dispense Refill  . DEPAKOTE ER 250 MG 24 hr tablet TAKE 2 TABLETS BY MOUTH TWICE A DAY (Patient taking differently: Take 500 mg by mouth 2 (two) times daily. ) 120 tablet 1  . TEGRETOL 200 MG tablet Take 1.5 tablets (300  mg total) by mouth 2 (two) times daily. 90 tablet 0  . TOPAMAX 25 MG tablet TAKE 3 TABLETS BY MOUTH TWICE A DAY (Patient taking differently: Take 75 mg by mouth 2 (two) times daily. ) 540 tablet 3   No facility-administered medications prior to visit.     PAST  MEDICAL HISTORY: Past Medical History:  Diagnosis Date  . Asthma   . Cerebral palsy (HCC) Birth  . Mental retardation Birth  . Seizures (HCC)     PAST SURGICAL HISTORY: No past surgical history on file.  FAMILY HISTORY: Family History  Problem Relation Age of Onset  . Migraines Mother   . Cancer Mother        liver cancer  . Diabetes Maternal Aunt   . Hypertension Maternal Grandmother     SOCIAL HISTORY: Social History   Socioeconomic History  . Marital status: Single    Spouse name: Not on file  . Number of children: 0  . Years of education: dis.school  . Highest education level: Not on file  Occupational History    Comment: disabled0  Social Needs  . Financial resource strain: Not on file  . Food insecurity:    Worry: Not on file    Inability: Not on file  . Transportation needs:    Medical: Not on file    Non-medical: Not on file  Tobacco Use  . Smoking status: Never Smoker  . Smokeless tobacco: Never Used  Substance and Sexual Activity  . Alcohol use: No  . Drug use: No  . Sexual activity: Never  Lifestyle  . Physical activity:    Days per week: Not on file    Minutes per session: Not on file  . Stress: Not on file  Relationships  . Social connections:    Talks on phone: Not on file    Gets together: Not on file    Attends religious service: Not on file    Active member of club or organization: Not on file    Attends meetings of clubs or organizations: Not on file    Relationship status: Not on file  . Intimate partner violence:    Fear of current or ex partner: Not on file    Emotionally abused: Not on file    Physically abused: Not on file    Forced sexual activity: Not on file  Other Topics Concern  . Not on file  Social History Narrative   Patient is right handed      Current Social History 04/13/2017           Patient lives with grandmother(Patricia White), maternal aunt Patricia White([name] Connole), female cousin and maternal uncle in one level  home 04/13/2017   Transportation: Patient relies on aunt for transportation 04/13/2017   Important Relationships Family 04/13/2017    Pets: [place], "Harmony" 04/13/2017   Education / Work:  12 th grade/ Disabled 04/13/2017   Interests / Fun: Going to [employer], out for pizza, scary movies 04/13/2017   Religious / Personal Beliefs: Attends Non-Denominational Church 04/13/2017   L. Leward Quanucatte, RN, BSN  PHYSICAL EXAM  Vitals:   08/05/18 0922  BP: (!) 143/94  Pulse: (!) 110  Weight: 176 lb 6.4 oz (80 kg)  Height: 5\' 3"  (1.6 m)   Body mass index is 31.25 kg/m. Generalized: Well developed, Obese female in no acute distress  Head: normocephalic and atraumatic,. Oropharynx benign poor dentition  Neck: Supple,  Musculoskeletal: Left hemiparesis   Neurological examination   Mentation: Alert decreased fluency pleasant . Follows most commands.  Cranial nerve II-XII: Pupils were equal round reactive to light extraocular movements were full, visual field were full on confrontational test. Facial sensation and strength were normal. hearing was intact to finger rubbing bilaterally. Uvula tongue midline. head turning and shoulder shrug were normal and symmetric.Tongue protrusion into cheek strength was normal. Motor: Left arm and leg atrophy, congenital spastic left hemiparesis and left upper extremity 4 out of 5 left lower extremity 3 out of 5 with AFO, right side 5 out of 5  Sensory: normal and symmetric to light touch, pinprick, and Vibration,  Coordination: finger-nose-finger normal on right unable to perform on the left due to hemiparesis Reflexes: Symmetric upper and lower Gait and Station: Rising up from seated position with mild difficulty without assistance, wide based stance spastic left hemiparetic gait, no assistive device  DIAGNOSTIC DATA (LABS, IMAGING, TESTING)   ASSESSMENT AND  PLAN 47 y.o. year old female has  past medical history of moderate MR, gait disorder, congenital left hemiparesis and seizure disorder with last seizure occurring in 2009.  Patient is being seen today for follow-up visit with recent seizure episode on 07/08/2018 possibly due to running out of carbamazepine.  Since discharge, she has been doing well without recurrent seizure episodes and compliance with Depakote, Topamax and carbamazepine.  Continue Depakote at current dose -brand-name prescription provided Continue Topamax at current dose -brand-name prescription provided Continue Tegretol at current dose -brand-name prescription provided Labs obtained today including blood work acid level, carbamazepine level, CMP and CBC for routine drug monitoring with continued use of Depakote and Tegretol Call for increase in seizure activity  Follow-up in 6 months and when necessary   George Hugh, AGNP-BC  [facility] [address] [place], [place] [address]  Phone [phone] Fax [phone] Note: This document was prepared with digital dictation and possible smart phrase technology. Any transcriptional errors that result from this process are unintentional.

## 2018-08-06 LAB — COMPREHENSIVE METABOLIC PANEL
A/G RATIO: 1 — AB (ref 1.2–2.2)
ALBUMIN: 4.1 g/dL (ref 3.5–5.5)
ALK PHOS: 101 IU/L (ref 39–117)
ALT: 7 IU/L (ref 0–32)
AST: 12 IU/L (ref 0–40)
BUN / CREAT RATIO: 4 — AB (ref 9–23)
BUN: 4 mg/dL — AB (ref 6–24)
CHLORIDE: 105 mmol/L (ref 96–106)
CO2: 20 mmol/L (ref 20–29)
Calcium: 9.4 mg/dL (ref 8.7–10.2)
Creatinine, Ser: 0.92 mg/dL (ref 0.57–1.00)
GFR calc non Af Amer: 75 mL/min/{1.73_m2} (ref >59)
GFR, EST AFRICAN AMERICAN: 87 mL/min/{1.73_m2} (ref >59)
GLOBULIN, TOTAL: 4.3 g/dL (ref 1.5–4.5)
Glucose: 125 mg/dL — ABNORMAL HIGH (ref 65–99)
Potassium: 3.5 mmol/L (ref 3.5–5.2)
SODIUM: 140 mmol/L (ref 134–144)
TOTAL PROTEIN: 8.4 g/dL (ref 6.0–8.5)

## 2018-08-06 LAB — CBC
HEMATOCRIT: 38.4 % (ref 34.0–46.6)
Hemoglobin: 12.7 g/dL (ref 11.1–15.9)
MCH: 30.5 pg (ref 26.6–33.0)
MCHC: 33.1 g/dL (ref 31.5–35.7)
MCV: 92 fL (ref 79–97)
PLATELETS: 258 10*3/uL (ref 150–450)
RBC: 4.17 x10E6/uL (ref 3.77–5.28)
RDW: 12.9 % (ref 12.3–15.4)
WBC: 5.4 10*3/uL (ref 3.4–10.8)

## 2018-08-06 LAB — CARBAMAZEPINE LEVEL, TOTAL: Carbamazepine (Tegretol), S: 8.6 ug/mL (ref 4.0–12.0)

## 2018-08-06 LAB — VALPROIC ACID LEVEL: Valproic Acid Lvl: 82 ug/mL (ref 50–100)

## 2018-08-08 ENCOUNTER — Telehealth: Payer: Self-pay

## 2018-08-08 NOTE — Telephone Encounter (Signed)
-----   Message from George HughJessica Vanschaick, NP sent at 08/08/2018  6:56 AM EST ----- Please notify patient and her grandmother that all of her labs look normal and to continue current treatment regimen.

## 2018-08-08 NOTE — Telephone Encounter (Signed)
Spoke with the patient and she verbalized understanding her results. No questions or concerns at this time.   

## 2018-08-13 ENCOUNTER — Other Ambulatory Visit: Payer: Self-pay | Admitting: Pharmacist

## 2018-08-13 ENCOUNTER — Ambulatory Visit: Payer: Self-pay | Admitting: Pharmacist

## 2018-08-13 NOTE — Patient Outreach (Signed)
[facility] Care Management  [facility]   08/13/2018  Patricia White 07/23/1971 [id number]   Successful patient outreach call to patient's caregiver [name] with HIPAA identifiers verified x2.  [facility] calling to check in on the status of new pharmacy devliery and patient that is s/p recent Neurology visit.  Per notes and caregiver, medications have remained the same.  All brand name medications continue and have been delivered to the patient.  Carbamazepine level was 8.6 on 08/05/18 per chart review.  No side effects reported per caregiver.  She states the patient has not had seizures since last admission.  PLAN: -I will follow up with caregiver next month to ensure medications have been delivered   Kieth BrightlyJulie Dattero Lachlyn Vanderstelt, PharmD, [facility] Clinical Pharmacist [facility]  [phone]

## 2018-08-20 ENCOUNTER — Other Ambulatory Visit: Payer: Self-pay

## 2018-08-20 NOTE — Patient Outreach (Signed)
[facility]) Care Management  08/20/2018  Patricia White Oct 15, 1970 [id number]   Follow up call to patient's legal guardian, Lawren Guarnera, to ensure receipt of resources mailed for a second time on 07/30/18 and to follow up on status of [employer].  Ms. Dhanraj confirmed receipt of resources.  The [employer]AT eligibility interview has not yet been scheduled.  BSW reminded her that she will need to contact [employer] to schedule this since they previously reached out to her and left a voicemail message.  BSW also reminded her that they will provide transportation for the interview if needed.  Patient is scheduled for an appointment with Dr. Abelardo Diesel on 09/05/18 at which time they will ask that he complete a request for personal care services.  BSW will follow up after this appointment to assist with navigating this application process.  Malachy Chamber, BSW Social Worker [phone]

## 2018-08-29 NOTE — Progress Notes (Signed)
I reviewed note and agree with plan.   VIKRAM R. PENUMALLI, MD 

## 2018-09-05 ENCOUNTER — Ambulatory Visit (INDEPENDENT_AMBULATORY_CARE_PROVIDER_SITE_OTHER): Payer: Medicare Other | Admitting: Family Medicine

## 2018-09-05 ENCOUNTER — Encounter: Payer: Self-pay | Admitting: Family Medicine

## 2018-09-05 VITALS — BP 135/70 | HR 111 | Temp 98.3°F | Wt 178.2 lb

## 2018-09-05 DIAGNOSIS — Z124 Encounter for screening for malignant neoplasm of cervix: Secondary | ICD-10-CM | POA: Diagnosis not present

## 2018-09-05 DIAGNOSIS — Z23 Encounter for immunization: Secondary | ICD-10-CM | POA: Diagnosis not present

## 2018-09-05 DIAGNOSIS — Z0000 Encounter for general adult medical examination without abnormal findings: Secondary | ICD-10-CM

## 2018-09-05 DIAGNOSIS — N939 Abnormal uterine and vaginal bleeding, unspecified: Secondary | ICD-10-CM

## 2018-09-05 DIAGNOSIS — G809 Cerebral palsy, unspecified: Secondary | ICD-10-CM

## 2018-09-05 MED ORDER — TETANUS-DIPHTH-ACELL PERTUSSIS 5-2.5-18.5 LF-MCG/0.5 IM SUSP: 0.5000 mL | Freq: Once | INTRAMUSCULAR | 0 refills | Status: AC

## 2018-09-05 NOTE — Assessment & Plan Note (Signed)
Acute.  Appears to be ovulatory in nature.  No history of heavy bleeding.  Not sexually active. - Initiating Depakote - RTC 3 months

## 2018-09-05 NOTE — Assessment & Plan Note (Signed)
Chronic.  Seizure stable on Depakote.  Followed by neurology, Beverely Low, NP. - Continue following up with neurology - Continue Depakot 100 mg twice daily - Will fill out transportation and home aid forms

## 2018-09-05 NOTE — Progress Notes (Signed)
   Subjective   Patient ID: Patricia White    DOB: May 27, 1971, 48 y.o. female   MRN: [id number]  CC: "discuss getting into nursing home"  HPI: Patricia White is a 48 y.o. female who presents to clinic today for the following:  Annual physical exam: Patient is here today accompanied by her grandmother and friend.  She has been doing well and grandmother has no concerns.  Patient is due for her flu shot and Pap smear.  She was given a referral to OB/GYN given concern for need for sedation to undergo a Pap smear, however referral was canceled and grandmother is unsure why.  Abnormal uterine bleed: Grandmother reports patient has normal cycles lasting 28 days with 5 days of bleeding without heavy periods.  She is currently having her normal cycle, however she did miss her cycle for approximately 2 months in November and December.  Grandmother suspects that she is having symptoms of menopause.  She would like her to be considered for Depo shot today.  Patient is not sexually active.  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  Grandmother presents paperwork for transportation and would like a form filled out for assistance at home.  ROS: see HPI for pertinent.  PMFSH: Infantile cerebral palsy, seizure disorder with left hemiparesis, obesity, asthma. Smoking status reviewed. Medications reviewed.  Objective   BP 135/70   Pulse (!) 111   Temp 98.3 F (36.8 C)   Wt 178 lb 3.2 oz (80.8 kg)   SpO2 98%   BMI 31.57 kg/m  Vitals and nursing note reviewed.  General: pleasant, well nourished, well developed, NAD  HEENT: normocephalic, atraumatic, moist mucous membranes, cerumen buildup bilaterally worse on right, PERRLA, EOMI, oropharynx without erythema Neck: supple, non-tender without lymphadenopathy Cardiovascular: regular rate and rhythm without murmurs, rubs, or gallops Lungs:  clear to auscultation bilaterally with normal work of breathing Abdomen: soft, non-tender, non-distended, normoactive bowel sounds Skin: warm, dry, no rashes or lesions, cap refill < 2 seconds Extremities: warm and well perfused, normal tone, no edema   Assessment & Plan   Infantile cerebral palsy (HCC) Chronic.  Seizure stable on Depakote.  Followed by neurology, Beverely Low, NP. - Continue following up with neurology - Continue Depakot 100 mg twice daily - Will fill out transportation and home aid forms  Abnormal uterine bleeding (AUB) Acute.  Appears to be ovulatory in nature.  No history of heavy bleeding.  Not sexually active. - Initiating Depakote - RTC 3 months  Orders Placed This Encounter  Procedures  . Ambulatory referral to Obstetrics / Gynecology    Referral Priority:   Routine    Referral Type:   Consultation    Referral Reason:   Specialty Services Required    Requested Specialty:   Obstetrics and Gynecology    Number of Visits Requested:   1   Meds ordered this encounter  Medications  . Tdap (BOOSTRIX) 5-2.5-18.5 LF-MCG/0.5 injection    Sig: Inject 0.5 mLs into the muscle once for 1 dose.    Dispense:  0.5 mL    Refill:  0    Durward Parcel, DO [facility], PGY-3 09/05/2018, 4:01 PM

## 2018-09-05 NOTE — Patient Instructions (Addendum)
Thank you for coming in to see [place] today. Please see below to review our plan for today's visit.  1.  Braylyn has received her annual flu vaccine. 2.  I placed a new referral for her to be evaluated by OB/GYN for a Pap smear. 3.  Unfortunately we are unable to provide the tetanus shot today.  I have provided you a prescription which you can take to the pharmacy to receive the vaccine. 4.  We will begin Depo-Provera for what may be early signs of menopause.  This may or may not improve her symptoms and usually takes several doses to have its full effect.  Each shot is given 3 months apart.  We will see you again in 3 months.  Please call the clinic at [phone] if your symptoms worsen or you have any concerns. It was our pleasure to serve you.  Durward Parcel, DO [facility], PGY-3

## 2018-09-05 NOTE — Assessment & Plan Note (Deleted)
Overall doing well.  Non-smoker.  Due for health maintenance as listed.

## 2018-09-06 ENCOUNTER — Telehealth: Payer: Self-pay

## 2018-09-06 DIAGNOSIS — N939 Abnormal uterine and vaginal bleeding, unspecified: Secondary | ICD-10-CM

## 2018-09-06 MED ORDER — MEDROXYPROGESTERONE ACETATE 150 MG/ML IM SUSP
150.0000 mg | Freq: Once | INTRAMUSCULAR | Status: AC
  Administered 2018-09-05: 150 mg via INTRAMUSCULAR

## 2018-09-06 NOTE — Progress Notes (Signed)
Patient was given RUOQ of Depo-Pro and tolerated it well.  Given for excessive bleeding. Patient given a reminder card.  Glennie Hawk, CMA

## 2018-09-06 NOTE — Telephone Encounter (Signed)
Completed and faxed [place] Department of Health and Human Services- Division of Medical Assistance request to [facility] [phone] per Dr. Abelardo Diesel.  Glennie Hawk, CMA

## 2018-09-09 ENCOUNTER — Other Ambulatory Visit: Payer: Self-pay

## 2018-09-09 NOTE — Patient Outreach (Signed)
[facility]) Care Management  09/09/2018  SATCHA LEGLEITER 01/04/1971 [id number]   Follow up call to patient's grandmother regarding social work referral for transportation resources and personal care services.  Patient attended appointment with Dr. Abelardo Diesel on 09/05/18 and the request for personal care services was faxed to [facility] on 09/06/18.  BSW informed grandmother that she will be contacted by [facility] for the purpose of scheduling the in-home assessment.  BSW also reminded her that she can contact [employer]AT any time she is ready to schedule that eligibility interview.  BSW will follow up within the next two weeks regarding the status of request for personal care services.   Malachy Chamber, BSW Social Worker [phone]

## 2018-09-13 ENCOUNTER — Other Ambulatory Visit: Payer: Self-pay | Admitting: Pharmacist

## 2018-09-13 ENCOUNTER — Ambulatory Visit: Payer: Self-pay | Admitting: Pharmacist

## 2018-09-13 NOTE — Patient Outreach (Addendum)
[facility]) Care Management [place]  09/13/2018  Patricia White 1971-07-02 [id number]  Reason for referral: medication management  [facility] pharmacy case is being closed due to the following reasons:  Goals have been met.  Successful care coordination call to [name] (patient's caregiver/family) with HIPAA identifiers verified.  She states patient is doing well today.  She sates that the patient's seizures have not been as present as they were before.  She is now on therapeutic doses (VPA=82, Carbamaz=8.6) as they are able to have medication delivered at no charge.  No ED admissions since 07/08/2018.  Patient is also able to get to appointments due to assistance from [name], [name].  Patient continues to have her medication delivered by [name] each month.  We have transitioned to 14-monthsupplies of all BRAND NAME seizure medications (Topamax, Tegretol, Depakote--DAW1 brand medically necessary).  [name] if assistance needed in the future.  Encouraged family to call [facility]if issues arise.  [facility] you for [name] be involved in this patient's care.    [name] PharmD, [place] [phone]

## 2018-09-20 ENCOUNTER — Other Ambulatory Visit: Payer: Self-pay

## 2018-09-20 NOTE — Patient Outreach (Signed)
[facility]) Care Management  09/20/2018  Patricia White Apr 09, 1971 [id number]   Successful outreach to patient's guardian regarding status of request for Personal Care Services.  [name] is scheduled for an in-home assessment with [employer] on Monday 09/23/18.  BSW will follow up by the end of next week regarding eligibility.  Malachy Chamber, BSW Social Worker [phone]

## 2018-09-23 ENCOUNTER — Ambulatory Visit: Payer: Self-pay

## 2018-09-27 ENCOUNTER — Other Ambulatory Visit: Payer: Self-pay

## 2018-09-27 NOTE — Patient Outreach (Signed)
[facility]) Care Management  09/27/2018  NATILEY RENE August 13, 1971 [id number]   Follow up outreach to patient's guardian, Dionte Preece, regarding status of request for personal care services.  Mida was assessed on 09/23/18 and was approved for services.  BSW reminded Mrs. Lollar that application was sent to [employer]AT and that she should contact them to schedule eligibility interview if still interested in service.  BSW is closing case at this time.  Malachy Chamber, BSW Social Worker [phone]

## 2019-01-30 ENCOUNTER — Telehealth: Payer: Self-pay

## 2019-01-30 NOTE — Telephone Encounter (Signed)
I called pts grandmother Patricia White that appt will be change to mychart video visit due to COVID 19. She gave verbal consent to do video and to file insurance. Pts aunt [name] on her dpr will assist with the mychart video visit. [name] was email the mychart link to create account and will use her lap top for the visit. She was explain to log into mychart and click on appt for video. They both verbalized understanding and know to log on 10 minutes early prior to appt. RN updated the meds, pcp, and pharmacy with her grandmother.

## 2019-02-04 ENCOUNTER — Telehealth: Payer: Self-pay | Admitting: Adult Health

## 2019-02-04 NOTE — Progress Notes (Deleted)
[facility]  PATIENT: Patricia White DOB: 10/21/1970  REASON FOR VISIT: Follow-up for seizure disorder HISTORY FROM: Grandmother   Virtual Visit via Video Note  I connected with Patricia White on 02/04/19 at 10:15 AM EDT by a video enabled telemedicine application located remotely in my own home and verified that I am speaking with the correct person using two identifiers who was located at their own home.   I discussed the limitations of evaluation and management by telemedicine and the availability of in person appointments. The patient expressed understanding and agreed to proceed.    HISTORY OF PRESENT ILLNESS: 02/04/19 VIRTUAL VISIT  Patricia White is a 48 year old female who is being seen today for follow-up for seizure management.  Initially scheduled for in office face-to-face visit but due to COVID-19 safety precautions, visit transition to telemedicine with patient's consent.  She has been doing well since prior visit without recurrent seizure activity or symptoms.  She reports ongoing compliance with valproate, carbamazepine and topiramate and denies any side effects.  Blood work obtained at prior visit which were all satisfactory.    08/05/2018 visit: Patient is being seen today due to seizure at home on 07/08/2018.  She is accompanied by her grandmother.  She did have a second seizure with EMS while being transported to ED.  Per ED notes, she continue to take valproic acid, carbamazepine and topiramate but had run out of her carbamazepine 3 days prior.  It was felt as though her seizure was likely running out of her carbamazepine as labs confirmed undetectable level.  She has not had any seizure activity in the past 4 years.  Valproic acid level 45. Since discharge home, denies any recent seizure activity. She continues on valproic acid, carbamazepine and topiramate without side effects. 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 She only wears this brace when she is out of the house.  Denies any recent falls.  Grandmother is concerned as her last menses was in October without menses in November or December. She is typically regular.  No further concerns at this time.  UPDATE 09/24/2018CM Patricia White, 48 year old female returns for follow-up with history of seizure disorder, moderate MR, congenital left hemiparesis abnormality of gait and history of cerebral palsy. She is currently on brand Tegretol and Depakote and Topamax. She still has some intermittent myoclonus 1-2 times a week. Appetite is good she sleeps well. She wears an AFO to her left lower extremity. She has not had any falls in the last year. She has not had any interval medical issues. She returns for reevaluation.   UPDATE 7/12/17CM: 48 year old female with moderate MR, congenital left hemiparesis, here for yearly follow up of seizure d/o. Last seizure in 2009. Still with intermittent myoclonus in the AM, 1-2 times per week. Tolerating meds without side effects needs refills and labs. She also needs a new AFO to her left lower extremity. No recent falls. No new medical issues since last seen. She returns for reevaluation  PRIOR HPI (07/23/12, CM): 48 year old black female returns today for followup. She was last seen in our office 07/17/11. She is a previous pt of Dr. Sharene SkeansHickling. 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 She returns today with her grandmother. [facility]. She has had no seizures in several years. She is currently on 3 different medications.She has had no falls. Continues  to have left lower extremity weakness with AFO in place. She intermittently wears a helmet but she does not have it on today. No hospitalizations or acute infections since last seen.    REVIEW OF SYSTEMS: Full 14 system review of systems performed and notable only for those listed, all  others are neg:  Constitutional: neg  Cardiovascular: neg Ear/Nose/Throat: neg  Skin: neg Eyes: neg Respiratory: neg Gastroitestinal: neg  Hematology/Lymphatic: neg  Endocrine: neg Musculoskeletal: Walking difficulty Allergy/Immunology: neg Neurological: Seizure disorder Psychiatric: Behavior problem Sleep : Snoring   ALLERGIES: No Known Allergies  HOME MEDICATIONS: Outpatient Medications Prior to Visit  Medication Sig Dispense Refill  . DEPAKOTE ER 250 MG 24 hr tablet Take 2 tablets (500 mg total) by mouth 2 (two) times daily. 360 tablet 3  . TEGRETOL 200 MG tablet Take 1.5 tablets (300 mg total) by mouth 2 (two) times daily. 270 tablet 3  . TOPAMAX 25 MG tablet Take 3 tablets (75 mg total) by mouth 2 (two) times daily. 540 tablet 3   No facility-administered medications prior to visit.     PAST MEDICAL HISTORY: Past Medical History:  Diagnosis Date  . Asthma   . Cerebral palsy (HCC) Birth  . Mental retardation Birth  . Seizures (HCC)     PAST SURGICAL HISTORY: No past surgical history on file.  FAMILY HISTORY: Family History  Problem Relation Age of Onset  . Migraines Mother   . Cancer Mother        liver cancer  . Diabetes Maternal Aunt   . Hypertension Maternal Grandmother     SOCIAL HISTORY: Social History   Socioeconomic History  . Marital status: Single    Spouse name: Not on file  . Number of children: 0  . Years of education: dis.school  . Highest education level: Not on file  Occupational History    Comment: disabled0  Social Needs  . Financial resource strain: Not on file  . Food insecurity    Worry: Not on file    Inability: Not on file  . Transportation needs    Medical: Not on file    Non-medical: Not on file  Tobacco Use  . Smoking status: Never Smoker  . Smokeless tobacco: Never Used  Substance and Sexual Activity  . Alcohol use: No  . Drug use: No  . Sexual activity: Never  Lifestyle  . Physical activity    Days per  week: Not on file    Minutes per session: Not on file  . Stress: Not on file  Relationships  . Social Musicianconnections    Talks on phone: Not on file    Gets together: Not on file    Attends religious service: Not on file    Active member of club or organization: Not on file    Attends meetings of clubs or organizations: Not on file    Relationship status: Not on file  . Intimate partner violence    Fear of current or ex partner: Not on file    Emotionally abused: Not on file    Physically abused: Not on file    Forced sexual activity: Not on file  Other Topics Concern  . Not on file  Social History Narrative   Patient is right handed      Current Social History 04/13/2017           Patient lives with grandmother([name] Patricia White), maternal aunt Lillia Mountain([name] Benway), female cousin and maternal uncle in one level home 04/13/2017  Transportation: Patient relies on aunt for transportation 04/13/2017   Important Relationships Family 04/13/2017    Pets: [place], "[place]" 04/13/2017   Education / Work:  12 th grade/ Disabled 04/13/2017   Interests / Fun: Going to [employer], out for pizza, scary movies 04/13/2017   Religious / Personal Beliefs: Attends Non-Denominational Church 04/13/2017   L. Ducatte, RN, BSN                                                                                                   PHYSICAL EXAM  There were no vitals filed for this visit. There is no height or weight on file to calculate BMI. Generalized: Well developed, Obese female in no acute distress  Head: normocephalic and atraumatic,. Oropharynx benign poor dentition  Neck: Supple,  Musculoskeletal: Left hemiparesis   Neurological examination   Mentation: Alert decreased fluency pleasant . Follows most commands.  Cranial nerve II-XII: Pupils were equal round reactive to light extraocular movements were full, visual field were full on confrontational test. Facial sensation and strength were normal. hearing  was intact to finger rubbing bilaterally. Uvula tongue midline. head turning and shoulder shrug were normal and symmetric.Tongue protrusion into cheek strength was normal. Motor: Left arm and leg atrophy, congenital spastic left hemiparesis and left upper extremity 4 out of 5 left lower extremity 3 out of 5 with AFO, right side 5 out of 5  Sensory: normal and symmetric to light touch, pinprick, and Vibration,  Coordination: finger-nose-finger normal on right unable to perform on the left due to hemiparesis Reflexes: Symmetric upper and lower Gait and Station: Rising up from seated position with mild difficulty without assistance, wide based stance spastic left hemiparetic gait, no assistive device    ASSESSMENT AND PLAN 48 y.o. year old female has  past medical history of moderate MR, gait disorder, congenital left hemiparesis and seizure disorder with last seizure occurring in 2009.  Patient is being seen today for follow-up visit with recent seizure episode on 07/08/2018 possibly due to running out of carbamazepine.  Since discharge, she has been doing well without recurrent seizure episodes and compliance with Depakote, Topamax and carbamazepine.  Continue Depakote at current dose -brand-name prescription provided Continue Topamax at current dose -brand-name prescription provided Continue Tegretol at current dose -brand-name prescription provided Labs obtained today including blood work acid level, carbamazepine level, CMP and CBC for routine drug monitoring with continued use of Depakote and Tegretol Call for increase in seizure activity  Follow-up in 6 months and when necessary   Venancio Poisson, AGNP-BC  [facility] [address] [place],  [address]  Phone [phone] Fax [phone] Note: This document was prepared with digital dictation and possible smart phrase technology. Any transcriptional errors that result from this process  are unintentional.

## 2019-02-05 ENCOUNTER — Encounter: Payer: Self-pay | Admitting: Adult Health

## 2019-02-05 ENCOUNTER — Telehealth (INDEPENDENT_AMBULATORY_CARE_PROVIDER_SITE_OTHER): Payer: Medicare Other | Admitting: Adult Health

## 2019-02-05 ENCOUNTER — Other Ambulatory Visit: Payer: Self-pay

## 2019-02-05 VITALS — Wt 155.0 lb

## 2019-02-05 DIAGNOSIS — G40909 Epilepsy, unspecified, not intractable, without status epilepticus: Secondary | ICD-10-CM

## 2019-02-05 DIAGNOSIS — Z5181 Encounter for therapeutic drug level monitoring: Secondary | ICD-10-CM

## 2019-02-05 NOTE — Progress Notes (Signed)
I have reviewed and agreed above plan. 

## 2019-02-05 NOTE — Progress Notes (Signed)
[facility]  PATIENT: Patricia White DOB: 04/24/1971  REASON FOR VISIT: Follow-up for seizure disorder HISTORY FROM: Grandmother   Virtual Visit via Video Note  I connected with Patricia AnnaNatasha C Cygan on 02/05/19 at 12:45 PM EDT by a video enabled telemedicine application located remotely in my own home and verified that I am speaking with the correct person using two identifiers who was located at their own home accompanied by her grandmother who provides majority of history.   I discussed the limitations of evaluation and management by telemedicine and the availability of in person appointments. The patient expressed understanding and agreed to proceed.    HISTORY OF PRESENT ILLNESS: 02/05/19 VIRTUAL VISIT  [name] is a 48 year old female who is being seen today for follow-up for seizure management with underlying history of seizure disorder, moderate MR, congenital left hemiparesis abnormality of gait and history of cerebral palsy.  Initially scheduled for in office face-to-face visit but due to COVID-19 safety precautions, visit transition to telemedicine with patient's and grandmothers consent.    Grandmother reports "jerking and twitching spells" recently over the past 2 weeks and is questioning whether she needs dosage increase of her medications.  Per review of prior office visits, reports of myoclonus has been discussed previously.  Occurs approximately 1-2 times weekly. denies seizure activity with loss of consciousness.  Denies any recent changes to medications, diet, activity level or increased stressors.  Grandmother also endorses increased fatigue level and recent weight gain.  Recent weight 158lb and previous weight 6 months prior 176lb.  She reports ongoing compliance with valproate, carbamazepine and topiramate and denies any side effects.  Blood work obtained at prior visit which were all satisfactory.  No further concerns at this time.    08/05/2018  visit: Patient is being seen today due to seizure at home on 07/08/2018.  She is accompanied by her grandmother.  She did have a second seizure with EMS while being transported to ED.  Per ED notes, she continue to take valproic acid, carbamazepine and topiramate but had run out of her carbamazepine 3 days prior.  It was felt as though her seizure was likely running out of her carbamazepine as labs confirmed undetectable level.  She has not had any seizure activity in the past 4 years.  Valproic acid level 45. Since discharge home, denies any recent seizure activity. She continues on valproic acid, carbamazepine and topiramate without side effects. 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 She only wears this brace when she is out of the house.  Denies any recent falls.  Grandmother is concerned as her last menses was in October without menses in November or December. She is typically regular.  No further concerns at this time.    REVIEW OF SYSTEMS: Full 14 system review of systems performed and notable only for those listed, all others are neg:  Myoclonus, walking difficulty, seizures   ALLERGIES: No Known Allergies  HOME MEDICATIONS: Outpatient Medications Prior to Visit  Medication Sig Dispense Refill  . DEPAKOTE ER 250 MG 24 hr tablet Take 2 tablets (500 mg total) by mouth 2 (two) times daily. 360 tablet 3  . TEGRETOL 200 MG tablet Take 1.5 tablets (300 mg total) by mouth 2 (two) times daily. 270 tablet 3  . TOPAMAX 25 MG tablet Take 3 tablets (75 mg total) by mouth 2 (two) times daily. 540 tablet 3   No facility-administered medications prior to visit.  PAST MEDICAL HISTORY: Past Medical History:  Diagnosis Date  . Asthma   . Cerebral palsy ([place]) Birth  . Mental retardation Birth  . Seizures ([place])     PAST SURGICAL HISTORY: No past surgical history on file.  FAMILY HISTORY: Family History  Problem Relation Age of Onset  .  Migraines Mother   . Cancer Mother        liver cancer  . Diabetes Maternal Aunt   . Hypertension Maternal Grandmother     SOCIAL HISTORY: Social History   Socioeconomic History  . Marital status: Single    Spouse name: Not on file  . Number of children: 0  . Years of education: dis.school  . Highest education level: Not on file  Occupational History    Comment: disabled0  Social Needs  . Financial resource strain: Not on file  . Food insecurity    Worry: Not on file    Inability: Not on file  . Transportation needs    Medical: Not on file    Non-medical: Not on file  Tobacco Use  . Smoking status: Never Smoker  . Smokeless tobacco: Never Used  Substance and Sexual Activity  . Alcohol use: No  . Drug use: No  . Sexual activity: Never  Lifestyle  . Physical activity    Days per week: Not on file    Minutes per session: Not on file  . Stress: Not on file  Relationships  . Social Herbalist on phone: Not on file    Gets together: Not on file    Attends religious service: Not on file    Active member of club or organization: Not on file    Attends meetings of clubs or organizations: Not on file    Relationship status: Not on file  . Intimate partner violence    Fear of current or ex partner: Not on file    Emotionally abused: Not on file    Physically abused: Not on file    Forced sexual activity: Not on file  Other Topics Concern  . Not on file  Social History Narrative   Patient is right handed      Current Social History 04/13/2017           Patient lives with grandmother([name] Glean Salvo), maternal aunt Arabia Nylund), female cousin and maternal uncle in one level home 04/13/2017   Transportation: Patient relies on aunt for transportation 04/13/2017   Important Relationships Family 04/13/2017    Pets: [place], "[place]" 04/13/2017   Education / Work:  12 th grade/ Disabled 04/13/2017   Interests / Fun: Going to [employer], out for pizza, scary  movies 04/13/2017   Religious / Personal Beliefs: Attends Non-Denominational Church 04/13/2017   L. Ducatte, RN, BSN                                                                                                   PHYSICAL EXAM  Generalized: Well developed, Obese female in no acute distress  Head: normocephalic and atraumatic,. Oropharynx benign poor dentition    Neurological examination  Mentation: Alert decreased fluency pleasant.  Pleasant and cooperative with examination Cranial nerve II-XII:  extraocular movements were full,  Facial sensation and strength were normal. hearing intact to voice.  Uvula tongue midline. head turning and shoulder shrug were normal and symmetric. Motor: Left upper and lower extremity weakness noted per drift assessment which is chronic.  No evidence of weakness in right upper or lower extremity Sensory: Intact to light touch Coordination: finger-nose-finger normal on right unable to perform on the left due to hemiparesis Reflexes: UTA Gait and Station: Rising up from seated position with mild difficulty without assistance, wide based stance spastic left hemiparetic gait, no assistive device    ASSESSMENT AND PLAN 48 y.o. year old female has  past medical history of moderate MR, gait disorder, congenital left hemiparesis and seizure disorder with last seizure occurring in 2009.  Patient is being seen today for follow-up visit with recent seizure episode on 07/08/2018 possibly due to running out of carbamazepine.  Seizures have been stable without recurrent seizure episodes and compliance with Depakote, Topamax and carbamazepine.  Grandmother does report myoclonus type symptoms 1-2 times weekly and increased fatigue level.  Will obtain valproic acid level, carbamazepine level, CMP to ensure satisfactory levels of current medications with increased fatigue level in 2-week duration of myoclonus activity.  Discussion with grandmother regarding no indication at  this time for dosage adjustments Continue Depakote at current dose -brand-name prescription provided Continue Topamax at current dose -brand-name prescription provided Continue Tegretol at current dose -brand-name prescription provided Call for increase in seizure activity  Recommend follow-up in 6 months with Amy, NP or call earlier if needed.  Grandmother unable to schedule follow-up visit at this time and requested to call office back to schedule this visit  Greater than 50% of this 15-minute visit was spent discussing ongoing seizure management with current medications, myoclonus activity and fatigue over 2 weeks and answering all questions to patient and grandmother satisfaction   George HughJessica Meigan Pates, AGNP-BC  [facility] [address] [place] [id number]  Phone [phone] Fax [phone] Note: This document was prepared with digital dictation and possible smart phrase technology. Any transcriptional errors that result from this process are unintentional.

## 2019-04-28 ENCOUNTER — Other Ambulatory Visit: Payer: Self-pay | Admitting: Adult Health

## 2019-07-14 ENCOUNTER — Other Ambulatory Visit: Payer: Self-pay | Admitting: Adult Health

## 2019-07-15 ENCOUNTER — Other Ambulatory Visit: Payer: Self-pay | Admitting: Adult Health

## 2019-08-04 ENCOUNTER — Other Ambulatory Visit: Payer: Self-pay

## 2019-08-04 ENCOUNTER — Ambulatory Visit (HOSPITAL_COMMUNITY)
Admission: EM | Admit: 2019-08-04 | Discharge: 2019-08-04 | Disposition: A | Discharge status: Home / Self Care | Payer: Medicare Other | Attending: Family Medicine | Admitting: Family Medicine

## 2019-08-04 ENCOUNTER — Encounter (HOSPITAL_COMMUNITY): Payer: Self-pay | Admitting: Emergency Medicine

## 2019-08-04 DIAGNOSIS — R21 Rash and other nonspecific skin eruption: Secondary | ICD-10-CM

## 2019-08-04 MED ORDER — PERMETHRIN 5 % EX CREA: TOPICAL_CREAM | CUTANEOUS | 1 refills | Status: DC

## 2019-08-04 MED ORDER — DIPHENHYDRAMINE HCL 25 MG PO TABS: 25.0000 mg | ORAL_TABLET | Freq: Three times a day (TID) | ORAL | 0 refills | Status: DC | PRN

## 2019-08-04 NOTE — ED Triage Notes (Signed)
Scratches on back and abdomen, scratching at both forearms.  Rash/scratching noticed for one day

## 2019-08-04 NOTE — Discharge Instructions (Addendum)
Use the medication as prescribed.  Follow up as needed for continued or worsening symptoms

## 2019-08-05 NOTE — ED Provider Notes (Signed)
[place]    CSN: [id number] Arrival date & time: 08/04/19  1236      History   Chief Complaint No chief complaint on file.   HPI Patricia White is a 48 y.o. female.   Pt is a 48 year old female with past medical history of asthma, cerebral palsy, mental retardation, seizures presents with rash.  Rash has been present for the past couple days.  The rash is generalized and located to back, abdomen, arms.  The rash is very itchy.  She is here with grandmother who is her guardian and states she has not had anything for the rash.    Denies any fever, joint pain. Denies any recent changes in lotions, detergents, foods or other possible irritants. No recent travel. Nobody else at home has the rash. Patient has been outside but denies any contact with plants or insects. No new foods or medications.   ROS per HPI      Past Medical History:  Diagnosis Date  . Asthma   . Cerebral palsy ([place]) Birth  . Mental retardation Birth  . Seizures [facility])     Patient Active Problem List   Diagnosis Date Noted  . Abnormal uterine bleeding (AUB) 09/05/2018  . Seizure disorder ([place]) 02/23/2016  . Left hemiparesis ([place]) 02/23/2015  . 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 Obesity (BMI 30.0-34.9) 10/13/2009  . Infantile cerebral palsy ([place]) 10/11/2006    History reviewed. No pertinent surgical history.  OB History   No obstetric history on file.      Home Medications    Prior to Admission medications   Medication Sig Start Date End Date Taking? Authorizing Provider  DEPAKOTE ER 250 MG 24 hr tablet Take 2 tablets (500 mg total) by mouth 2 (two) times daily. 08/05/18   Frann Rider, NP  diphenhydrAMINE (BENADRYL) 25 MG tablet Take 1 tablet (25 mg total) by mouth every 8 (eight) hours as needed for itching. 08/04/19   Loura Halt A, NP  permethrin (ELIMITE) 5 % cream Apply head to toe and leave on for 8 to 12 hours and then wash off. May repeat in 2 weeks if needed.  08/04/19   Damoni Causby, Tressia Miners A, NP  TEGRETOL 200 MG tablet Take 1.5 tablets by mouth two times daily.  Must keep appt on 09/09/2019. 07/16/19   Frann Rider, NP  TOPAMAX 25 MG tablet Take 3 tablets (75 mg total) by mouth 2 (two) times daily. 08/05/18   Frann Rider, NP    Family History Family History  Problem Relation Age of Onset  . Migraines Mother   . Cancer Mother        liver cancer  . Diabetes Maternal Aunt   . Hypertension Maternal Grandmother     Social History Social History   Tobacco Use  . Smoking status: Never Smoker  . Smokeless tobacco: Never Used  Substance Use Topics  . Alcohol use: No  . Drug use: No     Allergies   Patient has no known allergies.   Review of Systems Review of Systems   Physical Exam Triage Vital Signs ED Triage Vitals  Enc Vitals Group     BP 08/04/19 1415 (!) 154/105     Pulse Rate 08/04/19 1415 (!) 102     Resp 08/04/19 1415 20     Temp 08/04/19 1415 98.4 F (36.9 C)     Temp Source 08/04/19 1415 Axillary     SpO2 08/04/19 1415 100 %  Weight --      Height --      Head Circumference --      Peak Flow --      Pain Score 08/04/19 1410 0     Pain Loc --      Pain Edu? --      Excl. in GC? --    No data found.  Updated Vital Signs BP (!) 154/105 (BP Location: Right Arm)   Pulse (!) 102   Temp 98.4 F (36.9 C) (Axillary)   Resp 20   SpO2 100%   Visual Acuity Right Eye Distance:   Left Eye Distance:   Bilateral Distance:    Right Eye Near:   Left Eye Near:    Bilateral Near:     Physical Exam Vitals and nursing note reviewed.  Constitutional:      General: She is not in acute distress.    Appearance: Normal appearance. She is not ill-appearing, toxic-appearing or diaphoretic.  HENT:     Head: Normocephalic.     Nose: Nose normal.     Mouth/Throat:     Pharynx: Oropharynx is clear.  Eyes:     Conjunctiva/sclera: Conjunctivae normal.  Pulmonary:     Effort: Pulmonary effort is normal.    Musculoskeletal:        General: Normal range of motion.     Cervical back: Normal range of motion.  Skin:    General: Skin is warm and dry.     Findings: Rash present.     Comments: Rash to back, abdomen and bilateral forearms and hands. Papular with excoriations.   Neurological:     Mental Status: She is alert.  Psychiatric:        Mood and Affect: Mood normal.      UC Treatments / Results  Labs (all labs ordered are listed, but only abnormal results are displayed) Labs Reviewed - No data to display  EKG   Radiology No results found.  Procedures Procedures (including critical care time)  Medications Ordered in UC Medications - No data to display  Initial Impression / Assessment and Plan / UC Course  I have reviewed the triage vital signs and the nursing notes.  Pertinent labs & imaging results that were available during my care of the patient were reviewed by me and considered in my medical decision making (see chart for details).     Rash- appears to be consistent with scabies Will do Elimite with refill and benadryl for itching.  Follow up as needed for continued or worsening symptoms  Final Clinical Impressions(s) / UC Diagnoses   Final diagnoses:  Rash and nonspecific skin eruption     Discharge Instructions     Use the medication as prescribed.  Follow up as needed for continued or worsening symptoms     ED Prescriptions    Medication Sig Dispense Auth. Provider   permethrin (ELIMITE) 5 % cream Apply head to toe and leave on for 8 to 12 hours and then wash off. May repeat in 2 weeks if needed. 60 g Pravin Perezperez A, NP   diphenhydrAMINE (BENADRYL) 25 MG tablet Take 1 tablet (25 mg total) by mouth every 8 (eight) hours as needed for itching. 30 tablet Dahlia Byes A, NP     PDMP not reviewed this encounter.   Janace Aris, NP 08/05/19 [phone]

## 2019-08-11 ENCOUNTER — Other Ambulatory Visit: Payer: Self-pay | Admitting: Adult Health

## 2019-08-22 ENCOUNTER — Telehealth: Payer: Self-pay

## 2019-08-22 ENCOUNTER — Ambulatory Visit: Payer: Medicare Other

## 2019-09-09 ENCOUNTER — Encounter: Payer: Self-pay | Admitting: Family Medicine

## 2019-09-09 ENCOUNTER — Other Ambulatory Visit: Payer: Self-pay

## 2019-09-09 ENCOUNTER — Ambulatory Visit (INDEPENDENT_AMBULATORY_CARE_PROVIDER_SITE_OTHER): Payer: Medicare Other | Admitting: Family Medicine

## 2019-09-09 VITALS — BP 151/104 | HR 98 | Temp 97.6°F | Ht 63.0 in | Wt 184.0 lb

## 2019-09-09 DIAGNOSIS — G8194 Hemiplegia, unspecified affecting left nondominant side: Secondary | ICD-10-CM | POA: Diagnosis not present

## 2019-09-09 DIAGNOSIS — G809 Cerebral palsy, unspecified: Secondary | ICD-10-CM | POA: Diagnosis not present

## 2019-09-09 DIAGNOSIS — Z5181 Encounter for therapeutic drug level monitoring: Secondary | ICD-10-CM | POA: Diagnosis not present

## 2019-09-09 DIAGNOSIS — G40909 Epilepsy, unspecified, not intractable, without status epilepticus: Secondary | ICD-10-CM | POA: Diagnosis not present

## 2019-09-09 NOTE — Patient Instructions (Signed)
We will continue current treatment plan   Stay well hydrated   Follow up closely with PCP for any additional concerns of swelling   Follow up with our office in 6 months   Seizure, Adult A seizure is a sudden burst of abnormal electrical activity in the brain. Seizures usually last from 30 seconds to 2 minutes. They can cause many different symptoms. Usually, seizures are not harmful unless they last a long time. What are the causes? Common causes of this condition include:  Fever or infection.  Conditions that affect the brain, such as: ? A brain abnormality that you were born with. ? A brain or head injury. ? Bleeding in the brain. ? A tumor. ? Stroke. ? Brain disorders such as autism or cerebral palsy.  Low blood sugar.  Conditions that are passed from parent to child (are inherited).  Problems with substances, such as: ? Having a reaction to a drug or a medicine. ? Suddenly stopping the use of a substance (withdrawal). In some cases, the cause may not be known. A person who has repeated seizures over time without a clear cause has a condition called epilepsy. What increases the risk? You are more likely to get this condition if you have:  A family history of epilepsy.  Had a seizure in the past.  A brain disorder.  A history of head injury, lack of oxygen at birth, or strokes. What are the signs or symptoms? There are many types of seizures. The symptoms vary depending on the type of seizure you have. Examples of symptoms during a seizure include:  Shaking (convulsions).  Stiffness in the body.  Passing out (losing consciousness).  Head nodding.  Staring.  Not responding to sound or touch.  Loss of bladder control and bowel control. Some people have symptoms right before and right after a seizure happens. Symptoms before a seizure may include:  Fear.  Worry (anxiety).  Feeling like you may vomit (nauseous).  Feeling like the room is spinning  (vertigo).  Feeling like you saw or heard something before (dj vu).  Odd tastes or smells.  Changes in how you see. You may see flashing lights or spots. Symptoms after a seizure happens can include:  Confusion.  Sleepiness.  Headache.  Weakness on one side of the body. How is this treated? Most seizures will stop on their own in under 5 minutes. In these cases, no treatment is needed. Seizures that last longer than 5 minutes will usually need treatment. Treatment can include:  Medicines given through an IV tube.  Avoiding things that are known to cause your seizures. These can include medicines that you take for another condition.  Medicines to treat epilepsy.  Surgery to stop the seizures. This may be needed if medicines do not help. Follow these instructions at home: Medicines  Take over-the-counter and prescription medicines only as told by your doctor.  Do not eat or drink anything that may keep your medicine from working, such as alcohol. Activity  Do not do any activities that would be dangerous if you had another seizure, like driving or swimming. Wait until your doctor says it is safe for you to do them.  If you live in the [place] ask your local DMV (department of motor vehicles) when you can drive.  Get plenty of rest. Teaching others Teach friends and family what to do when you have a seizure. They should:  Lay you on the ground.  Protect your head and body.  Loosen any tight clothing around your neck.  Turn you on your side.  Not hold you down.  Not put anything into your mouth.  Know whether or not you need emergency care.  Stay with you until you are better.  General instructions  Contact your doctor each time you have a seizure.  Avoid anything that gives you seizures.  Keep a seizure diary. Write down: ? What you think caused each seizure. ? What you remember about each seizure.  Keep all follow-up visits as told by your doctor.  This is important. Contact a doctor if:  You have another seizure.  You have seizures more often.  There is any change in what happens during your seizures.  You keep having seizures with treatment.  You have symptoms of being sick or having an infection. Get help right away if:  You have a seizure that: ? Lasts longer than 5 minutes. ? Is different than seizures you had before. ? Makes it harder to breathe. ? Happens after you hurt your head.  You have any of these symptoms after a seizure: ? Not being able to speak. ? Not being able to use a part of your body. ? Confusion. ? A bad headache.  You have two or more seizures in a row.  You do not wake up right after a seizure.  You get hurt during a seizure. These symptoms may be an emergency. Do not wait to see if the symptoms will go away. Get medical help right away. Call your local emergency services (911 in the [place] Do not drive yourself to the hospital. Summary  Seizures usually last from 30 seconds to 2 minutes. Usually, they are not harmful unless they last a long time.  Do not eat or drink anything that may keep your medicine from working, such as alcohol.  Teach friends and family what to do when you have a seizure.  Contact your doctor each time you have a seizure. This information is not intended to replace advice given to you by your health care provider. Make sure you discuss any questions you have with your health care provider. Document Revised: 10/18/2018 Document Reviewed: 10/18/2018 Elsevier Patient Education  2020 [employer].

## 2019-09-09 NOTE — Progress Notes (Signed)
PATIENT: Patricia White DOB: 1971/07/15  REASON FOR VISIT: follow up HISTORY FROM: patient  Chief Complaint  Patient presents with  . Follow-up    RM9. With grandmother. States there is some concern about swelling in her eyes and lips. Has had "jerking spells."     HISTORY OF PRESENT ILLNESS: Today 09/09/19 Patricia White is a 49 y.o. female here today for follow up for seizures. She presents today with her grandmother who aids in history. Her grandmother reports that overall she is doing very well. She continues Depakote ER 500 mg twice daily, Tegretol 300 mg twice daily and Topamax 75 mg twice daily. She is tolerating medications well with no obvious adverse effects. Her grandmother denies any recent seizure activity. Last seizure in 07/2018. She does continue to note intermittent jerking of her upper and lower extremities. Jerking typically last a couple of seconds. Patricia White is able to communicate during these events. There is no tonic-clonic movement. No tongue injuries or incontinence. Jerking has been persistent for years. 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 Her grandmother also notes that over the last couple days, she has noted very mild swelling of patient's right cheek and left upper lip. There has been no difficulty eating or swallowing. No respiratory difficulty. No other rash or swelling.  Patricia White is able to dress herself. She does require assistance with bathing. She does not cook or drive. She lives with her grandmother who is primary caregiver as her mother passed away about 8 years ago. She has not been seen by PCP recently. She does not usually have elevated blood pressures per grandmother. She is doing well today and without concerns.   HISTORY: (copied from [facility] note on 02/05/2019)  Ms. Tyson is a 49 year old female who is being seen today for follow-up for seizure management with underlying history of seizure disorder,  moderate MR, congenital left hemiparesis abnormality of gait and history of cerebral palsy.  Initially scheduled for in office face-to-face visit but due to COVID-19 safety precautions, visit transition to telemedicine with patient's and grandmothers consent.    Grandmother reports "jerking and twitching spells" recently over the past 2 weeks and is questioning whether she needs dosage increase of her medications.  Per review of prior office visits, reports of myoclonus has been discussed previously.  Occurs approximately 1-2 times weekly. denies seizure activity with loss of consciousness.  Denies any recent changes to medications, diet, activity level or increased stressors.  Grandmother also endorses increased fatigue level and recent weight gain.  Recent weight 158lb and previous weight 6 months prior 176lb.  She reports ongoing compliance with valproate, carbamazepine and topiramate and denies any side effects.  Blood work obtained at prior visit which were all satisfactory.  No further concerns at this time.  08/05/2018 visit: Patient is being seen today due to seizure at home on 07/08/2018.  She is accompanied by her grandmother.  She did have a second seizure with EMS while being transported to ED.  Per ED notes, she continue to take valproic acid, carbamazepine and topiramate but had run out of her carbamazepine 3 days prior.  It was felt as though her seizure was likely running out of her carbamazepine as labs confirmed undetectable level.  She has not had any seizure activity in the past 4 years.  Valproic acid level 45. Since discharge home, denies any recent seizure activity. She continues on valproic acid, carbamazepine and topiramate without side effects. She  does history of congenital left hemiparesis and currently wearing foot drop brace. She only wears this brace when she is out of the house.  Denies any recent falls.  Grandmother is concerned as her last menses was in October without menses  in November or December. She is typically regular.  No further concerns at this time.   REVIEW OF SYSTEMS: Out of a complete 14 system review of symptoms, the patient complains only of the following symptoms, seizure, swelling of right cheek and lip and all other reviewed systems are negative.  ALLERGIES: No Known Allergies  HOME MEDICATIONS: Outpatient Medications Prior to Visit  Medication Sig Dispense Refill  . DEPAKOTE ER 250 MG 24 hr tablet TAKE 2 TABLETS BY MOUTH 2 TIMES DAILY 360 tablet 0  . TEGRETOL 200 MG tablet Take 1.5 tablets by mouth two times daily.  Must keep appt on 09/09/2019. 270 tablet 0  . TOPAMAX 25 MG tablet TAKE 3 TABLETS BY MOUTH 2 TIMES DAILY 540 tablet 0  . diphenhydrAMINE (BENADRYL) 25 MG tablet Take 1 tablet (25 mg total) by mouth every 8 (eight) hours as needed for itching. (Patient not taking: Reported on 09/09/2019) 30 tablet 0  . permethrin (ELIMITE) 5 % cream Apply head to toe and leave on for 8 to 12 hours and then wash off. May repeat in 2 weeks if needed. (Patient not taking: Reported on 09/09/2019) 60 g 1   No facility-administered medications prior to visit.    PAST MEDICAL HISTORY: Past Medical History:  Diagnosis Date  . Asthma   . Cerebral palsy ([place]) Birth  . Mental retardation Birth  . Seizures ([place])     PAST SURGICAL HISTORY: No past surgical history on file.  FAMILY HISTORY: Family History  Problem Relation Age of Onset  . Migraines Mother   . Cancer Mother        liver cancer  . Diabetes Maternal Aunt   . Hypertension Maternal Grandmother     SOCIAL HISTORY: Social History   Socioeconomic History  . Marital status: Single    Spouse name: Not on file  . Number of children: 0  . Years of education: dis.school  . Highest education level: Not on file  Occupational History    Comment: disabled0  Tobacco Use  . Smoking status: Never Smoker  . Smokeless tobacco: Never Used  Substance and Sexual Activity  . Alcohol use: No    . Drug use: No  . Sexual activity: Never  Other Topics Concern  . Not on file  Social History Narrative   Patient is right handed      Current Social History 04/13/2017           Patient lives with grandmother([name] Glean Salvo), maternal aunt Shelby Peltz), female cousin and maternal uncle in one level home 04/13/2017   Transportation: Patient relies on aunt for transportation 04/13/2017   Important Relationships Family 04/13/2017    Pets: [place], "Harmony" 04/13/2017   Education / Work:  12 th grade/ Disabled 04/13/2017   Interests / Fun: Going to [employer], out for pizza, scary movies 04/13/2017   Religious / Personal Beliefs: Attends Non-Denominational Church 04/13/2017   L. Silvano Rusk, RN, BSN  Social Determinants of Health   Financial Resource Strain:   . Difficulty of Paying Living Expenses: Not on file  Food Insecurity:   . Worried About Programme researcher, broadcasting/film/video in the Last Year: Not on file  . Ran Out of Food in the Last Year: Not on file  Transportation Needs:   . Lack of Transportation (Medical): Not on file  . Lack of Transportation (Non-Medical): Not on file  Physical Activity:   . Days of Exercise per Week: Not on file  . Minutes of Exercise per Session: Not on file  Stress:   . Feeling of Stress : Not on file  Social Connections:   . Frequency of Communication with Friends and Family: Not on file  . Frequency of Social Gatherings with Friends and Family: Not on file  . Attends Religious Services: Not on file  . Active Member of Clubs or Organizations: Not on file  . Attends Banker Meetings: Not on file  . Marital Status: Not on file  Intimate Partner Violence:   . Fear of Current or Ex-Partner: Not on file  . Emotionally Abused: Not on file  . Physically Abused: Not on file  . Sexually Abused: Not on file      PHYSICAL EXAM  Vitals:   09/09/19 1145   BP: (!) 151/104  Pulse: 98  Temp: 97.6 F (36.4 C)  Weight: 184 lb (83.5 kg)  Height: 5\' 3"  (1.6 m)   Body mass index is 32.59 kg/m.  Generalized: Well developed, in no acute distress  Cardiology: normal rate and rhythm, no murmur noted Respiratory: Clear to auscultation of anterior lobes bilaterally, no stridor or wheezing noted Neurological examination  Mentation: Alert. Follows all commands speech and language fluent Cranial nerve II-XII: Pupils were equal round reactive to light. Extraocular movements were full, visual field were full on confrontational test. Facial sensation and strength were normal. Uvula tongue midline. Head turning and shoulder shrug  were normal and symmetric. Motor: The motor testing reveals 5 over 5 strength of right upper and lower extremity. Left upper extremity with significant spasticity and contraction of left hand, left lower extremity 4/5.  Good symmetric motor tone is noted throughout.  Sensory: Sensory testing is intact to soft touch on all 4 extremities. No evidence of extinction is noted.  Coordination: Cerebellar testing reveals somewhat ataxic finger-nose-finger and heel-to-shin bilaterally.  Gait and station: spastic, left hemiplegic gait, stable without assistive device.  Reflexes: Deep tendon reflexes are symmetric and normal bilaterally.   DIAGNOSTIC DATA (LABS, IMAGING, TESTING) - I reviewed patient records, labs, notes, testing and imaging myself where available.  No flowsheet data found.   Lab Results  Component Value Date   WBC 5.4 08/05/2018   HGB 12.7 08/05/2018   HCT 38.4 08/05/2018   MCV 92 08/05/2018   PLT 258 08/05/2018      Component Value Date/Time   NA 140 08/05/2018 1013   K 3.5 08/05/2018 1013   CL 105 08/05/2018 1013   CO2 20 08/05/2018 1013   GLUCOSE 125 (H) 08/05/2018 1013   GLUCOSE 133 (H) 07/08/2018 2305   BUN 4 (L) 08/05/2018 1013   CREATININE 0.92 08/05/2018 1013   CALCIUM 9.4 08/05/2018 1013   PROT 8.4  08/05/2018 1013   ALBUMIN 4.1 08/05/2018 1013   AST 12 08/05/2018 1013   ALT 7 08/05/2018 1013   ALKPHOS 101 08/05/2018 1013   BILITOT <0.2 08/05/2018 1013   GFRNONAA 75 08/05/2018 1013   GFRAA 87  08/05/2018 1013   Lab Results  Component Value Date   CHOL 146 10/19/2009   HDL 55 10/19/2009   LDLCALC 74 10/19/2009   TRIG 85 10/19/2009   CHOLHDL 2.7 Ratio 10/19/2009   No results found for: HGBA1C No results found for: VITAMINB12 No results found for: TSH     ASSESSMENT AND PLAN 49 y.o. year old female  has a past medical history of Asthma, Cerebral palsy (HCC) (Birth), Mental retardation (Birth), and Seizures (HCC). here with     ICD-10-CM   1. Seizure disorder (HCC)  G40.909 Carbamazepine level, total    Valproic Acid Level    CMP  2. Medication monitoring encounter  Z51.81 Carbamazepine level, total    Valproic Acid Level    CMP  3. Left hemiparesis (HCC)  G81.94   4. Infantile cerebral palsy (HCC)  G80.9     Patricia White is doing very well overall. She is tolerating Depakote, Tegretol and Topamax without obvious adverse effects. We will continue medications as prescribed. I will update lab work today. We have discussed concerns of jerking with her grandmother. Movement seems more consistent with myoclonus versus seizure activity. We will continue to monitor symptoms very closely we have also discussed blood pressure reading in the office today. Her grandmother reports that blood pressures are typically normal. They have not checked her blood pressure recently. Blood pressure was 143/94 at last office visit with Ihor Austin, NP in 07/2018. I have asked her grandmother to schedule follow-up with primary care to assess more closely. I have also recommended primary care follow-up for concerns of facial and lip swelling. No swelling noted today on exam. Patient has not had any difficulty eating or swallowing. No trouble breathing. Respiratory exam is unremarkable. Her grandmother was  educated on red flag warnings and when to seek emergency medical attention. Patient will follow up with [place] in 6 months, sooner if needed. Her grandmother verbalizes understanding and agreement with this plan.   Orders Placed This Encounter  Procedures  . Carbamazepine level, total  . Valproic Acid Level  . CMP     No orders of the defined types were placed in this encounter.     Shawnie Dapper, FNP-C 09/09/2019, 2:41 PM [facility] [address], [address] [place], [place] [address] [phone]

## 2019-09-10 ENCOUNTER — Other Ambulatory Visit: Payer: Self-pay | Admitting: Neurology

## 2019-09-10 LAB — COMPREHENSIVE METABOLIC PANEL
ALT: 4 IU/L (ref 0–32)
AST: 10 IU/L (ref 0–40)
Albumin/Globulin Ratio: 1.1 — ABNORMAL LOW (ref 1.2–2.2)
Albumin: 4.1 g/dL (ref 3.8–4.8)
Alkaline Phosphatase: 89 IU/L (ref 39–117)
BUN/Creatinine Ratio: 6 — ABNORMAL LOW (ref 9–23)
BUN: 5 mg/dL — ABNORMAL LOW (ref 6–24)
Bilirubin Total: <0.2 mg/dL (ref 0.0–1.2)
CO2: 23 mmol/L (ref 20–29)
Calcium: 9 mg/dL (ref 8.7–10.2)
Chloride: 102 mmol/L (ref 96–106)
Creatinine, Ser: 0.8 mg/dL (ref 0.57–1.00)
GFR calc Af Amer: 102 mL/min/{1.73_m2} (ref >59)
GFR calc non Af Amer: 89 mL/min/{1.73_m2} (ref >59)
Globulin, Total: 3.7 g/dL (ref 1.5–4.5)
Glucose: 91 mg/dL (ref 65–99)
Potassium: 4 mmol/L (ref 3.5–5.2)
Sodium: 138 mmol/L (ref 134–144)
Total Protein: 7.8 g/dL (ref 6.0–8.5)

## 2019-09-10 LAB — CARBAMAZEPINE LEVEL, TOTAL: Carbamazepine (Tegretol), S: 10.7 ug/mL (ref 4.0–12.0)

## 2019-09-10 LAB — VALPROIC ACID LEVEL: Valproic Acid Lvl: 74 ug/mL (ref 50–100)

## 2019-09-11 ENCOUNTER — Telehealth: Payer: Self-pay | Admitting: *Deleted

## 2019-09-11 NOTE — Telephone Encounter (Signed)
-----   Message from Shawnie Dapper, NP sent at 09/10/2019 11:51 AM EST ----- Labs look good!

## 2019-09-11 NOTE — Telephone Encounter (Signed)
I relayed to caregiver for Patricia White that the lab results per AL/NP looked good. She verbalized understanding.

## 2019-09-14 ENCOUNTER — Emergency Department (HOSPITAL_COMMUNITY)
Admission: EM | Admit: 2019-09-14 | Discharge: 2019-09-14 | Disposition: A | Discharge status: Home / Self Care | Payer: Medicare Other | Attending: Emergency Medicine | Admitting: Emergency Medicine

## 2019-09-14 ENCOUNTER — Emergency Department (HOSPITAL_COMMUNITY): Payer: Medicare Other

## 2019-09-14 ENCOUNTER — Other Ambulatory Visit: Payer: Self-pay

## 2019-09-14 ENCOUNTER — Encounter (HOSPITAL_COMMUNITY): Payer: Self-pay | Admitting: Emergency Medicine

## 2019-09-14 DIAGNOSIS — G4489 Other headache syndrome: Secondary | ICD-10-CM | POA: Diagnosis not present

## 2019-09-14 DIAGNOSIS — Z79899 Other long term (current) drug therapy: Secondary | ICD-10-CM | POA: Diagnosis not present

## 2019-09-14 DIAGNOSIS — Z743 Need for continuous supervision: Secondary | ICD-10-CM | POA: Diagnosis not present

## 2019-09-14 DIAGNOSIS — R531 Weakness: Secondary | ICD-10-CM

## 2019-09-14 DIAGNOSIS — R4781 Slurred speech: Secondary | ICD-10-CM | POA: Insufficient documentation

## 2019-09-14 DIAGNOSIS — G809 Cerebral palsy, unspecified: Secondary | ICD-10-CM | POA: Diagnosis not present

## 2019-09-14 DIAGNOSIS — R29818 Other symptoms and signs involving the nervous system: Secondary | ICD-10-CM | POA: Diagnosis not present

## 2019-09-14 DIAGNOSIS — J45909 Unspecified asthma, uncomplicated: Secondary | ICD-10-CM | POA: Insufficient documentation

## 2019-09-14 DIAGNOSIS — R404 Transient alteration of awareness: Secondary | ICD-10-CM | POA: Diagnosis not present

## 2019-09-14 DIAGNOSIS — R29810 Facial weakness: Secondary | ICD-10-CM | POA: Diagnosis not present

## 2019-09-14 LAB — DIFFERENTIAL
Abs Immature Granulocytes: 0.02 10*3/uL (ref 0.00–0.07)
Basophils Absolute: 0 10*3/uL (ref 0.0–0.1)
Basophils Relative: 0 %
Eosinophils Absolute: 0 10*3/uL (ref 0.0–0.5)
Eosinophils Relative: 0 %
Immature Granulocytes: 0 %
Lymphocytes Relative: 21 %
Lymphs Abs: 1.4 10*3/uL (ref 0.7–4.0)
Monocytes Absolute: 0.4 10*3/uL (ref 0.1–1.0)
Monocytes Relative: 6 %
Neutro Abs: 4.8 10*3/uL (ref 1.7–7.7)
Neutrophils Relative %: 73 %

## 2019-09-14 LAB — COMPREHENSIVE METABOLIC PANEL
ALT: 9 U/L (ref 0–44)
AST: 16 U/L (ref 15–41)
Albumin: 3.2 g/dL — ABNORMAL LOW (ref 3.5–5.0)
Alkaline Phosphatase: 74 U/L (ref 38–126)
Anion gap: 8 (ref 5–15)
BUN: 7 mg/dL (ref 6–20)
CO2: 22 mmol/L (ref 22–32)
Calcium: 8.4 mg/dL — ABNORMAL LOW (ref 8.9–10.3)
Chloride: 106 mmol/L (ref 98–111)
Creatinine, Ser: 0.89 mg/dL (ref 0.44–1.00)
GFR calc Af Amer: >60 mL/min (ref >60)
GFR calc non Af Amer: >60 mL/min (ref >60)
Glucose, Bld: 115 mg/dL — ABNORMAL HIGH (ref 70–99)
Potassium: 3.7 mmol/L (ref 3.5–5.1)
Sodium: 136 mmol/L (ref 135–145)
Total Bilirubin: 0.4 mg/dL (ref 0.3–1.2)
Total Protein: 7.9 g/dL (ref 6.5–8.1)

## 2019-09-14 LAB — I-STAT CHEM 8, ED
BUN: 7 mg/dL (ref 6–20)
Calcium, Ion: 1.1 mmol/L — ABNORMAL LOW (ref 1.15–1.40)
Chloride: 108 mmol/L (ref 98–111)
Creatinine, Ser: 0.8 mg/dL (ref 0.44–1.00)
Glucose, Bld: 114 mg/dL — ABNORMAL HIGH (ref 70–99)
HCT: 37 % (ref 36.0–46.0)
Hemoglobin: 12.6 g/dL (ref 12.0–15.0)
Potassium: 3.8 mmol/L (ref 3.5–5.1)
Sodium: 140 mmol/L (ref 135–145)
TCO2: 23 mmol/L (ref 22–32)

## 2019-09-14 LAB — CBC
HCT: 37.7 % (ref 36.0–46.0)
Hemoglobin: 12.2 g/dL (ref 12.0–15.0)
MCH: 31.3 pg (ref 26.0–34.0)
MCHC: 32.4 g/dL (ref 30.0–36.0)
MCV: 96.7 fL (ref 80.0–100.0)
Platelets: 281 10*3/uL (ref 150–400)
RBC: 3.9 MIL/uL (ref 3.87–5.11)
RDW: 12.2 % (ref 11.5–15.5)
WBC: 6.7 10*3/uL (ref 4.0–10.5)
nRBC: 0 % (ref 0.0–0.2)

## 2019-09-14 LAB — VALPROIC ACID LEVEL: Valproic Acid Lvl: 54 ug/mL (ref 50.0–100.0)

## 2019-09-14 LAB — I-STAT BETA HCG BLOOD, ED (MC, WL, AP ONLY): I-stat hCG, quantitative: <5 m[IU]/mL (ref <5)

## 2019-09-14 LAB — PROTIME-INR
INR: 1 (ref 0.8–1.2)
Prothrombin Time: 13.5 seconds (ref 11.4–15.2)

## 2019-09-14 LAB — CBG MONITORING, ED: Glucose-Capillary: 108 mg/dL — ABNORMAL HIGH (ref 70–99)

## 2019-09-14 LAB — AMMONIA: Ammonia: 51 umol/L — ABNORMAL HIGH (ref 9–35)

## 2019-09-14 LAB — CARBAMAZEPINE LEVEL, TOTAL: Carbamazepine Lvl: 8 ug/mL (ref 4.0–12.0)

## 2019-09-14 LAB — APTT: aPTT: 28 seconds (ref 24–36)

## 2019-09-14 MED ORDER — SODIUM CHLORIDE 0.9% FLUSH: 3.0000 mL | Freq: Once | INTRAVENOUS | Status: DC

## 2019-09-14 NOTE — ED Notes (Signed)
Discharge instructions reviewed w/ pt's caregiver. Pt dressed and brought to lobby to await her ride. Pt reports she is feeling much better.

## 2019-09-14 NOTE — Discharge Instructions (Addendum)
Symptoms appear to have resolved here.  Neurology saw and evaluated patient.  Labs normal.  She may have had a seizure but no seizure activity here. Please return here if worse at any time.  Follow up with her doctor.

## 2019-09-14 NOTE — ED Triage Notes (Signed)
Pt here from home via [facility] for sudden aphasia and R side weakness. Pt has hx of seizures and CP, w/ L side deficits. Per EMS pt c/o headache and vision. BP 158/112 per EMS.

## 2019-09-14 NOTE — ED Provider Notes (Signed)
[facility] EMERGENCY DEPARTMENT Provider Note   CSN: [id number] Arrival date & time: 09/14/19  1207     History Chief Complaint  Patient presents with  . Code Stroke    Patricia White is a 49 y.o. female.  HPI  49 yo female with cp, left sided weakness, seizures  Presents today with question of increased right side weakness decreased ability to speak with lkn 1130.  Review of record - patient seen 1/26 in neurology office with jerking.  Patient on depakote 500 er bid , Tegretol 300 mg twice daily, and Topamax 25 mg twice daily.  And topamax  and     12:54 PM Patient states she bit her tongue and it hurts. Family spoke to her on phone and state speech at baseline.   Past Medical History:  Diagnosis Date  . Asthma   . Cerebral palsy (HCC) Birth  . Mental retardation Birth  . Seizures [facility])     Patient Active Problem List   Diagnosis Date Noted  . Abnormal uterine bleeding (AUB) 09/05/2018  . Seizure disorder (HCC) 02/23/2016  . Left hemiparesis (HCC) 02/23/2015  . 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 Obesity (BMI 30.0-34.9) 10/13/2009  . Infantile cerebral palsy (HCC) 10/11/2006    No past surgical history on file.   OB History   No obstetric history on file.     Family History  Problem Relation Age of Onset  . Migraines Mother   . Cancer Mother        liver cancer  . Diabetes Maternal Aunt   . Hypertension Maternal Grandmother     Social History   Tobacco Use  . Smoking status: Never Smoker  . Smokeless tobacco: Never Used  Substance Use Topics  . Alcohol use: No  . Drug use: No    Home Medications Prior to Admission medications   Medication Sig Start Date End Date Taking? Authorizing Provider  carbamazepine (TEGRETOL) 200 MG tablet TAKE 1&1/2 TABLETS BY MOUTH TWICE A DAY 09/10/19   Lomax, Amy, NP  DEPAKOTE ER 250 MG 24 hr tablet TAKE 2 TABLETS BY MOUTH TWICE A DAY 09/10/19   Lomax, Amy, NP  diphenhydrAMINE (BENADRYL) 25  MG tablet Take 1 tablet (25 mg total) by mouth every 8 (eight) hours as needed for itching. Patient not taking: Reported on 09/09/2019 08/04/19   Dahlia Byes A, NP  permethrin (ELIMITE) 5 % cream Apply head to toe and leave on for 8 to 12 hours and then wash off. May repeat in 2 weeks if needed. Patient not taking: Reported on 09/09/2019 08/04/19   Dahlia Byes A, NP  TOPAMAX 25 MG tablet TAKE 3 TABLETS BY MOUTH TWICE A DAY 09/10/19   Lomax, Amy, NP    Allergies    Patient has no known allergies.  Review of Systems   Review of Systems  Physical Exam Updated Vital Signs SpO2 97%   Physical Exam Vitals and nursing note reviewed.  HENT:     Head: Normocephalic.     Right Ear: External ear normal.     Left Ear: External ear normal.     Nose: Nose normal.     Mouth/Throat:     Mouth: Mucous membranes are moist.  Eyes:     Pupils: Pupils are equal, round, and reactive to light.  Cardiovascular:     Rate and Rhythm: Normal rate and regular rhythm.  Pulmonary:     Effort: Pulmonary effort is normal.  Breath sounds: Normal breath sounds.  Abdominal:     General: Abdomen is flat.     Palpations: Abdomen is soft.  Musculoskeletal:        General: Normal range of motion.     Cervical back: Normal range of motion.  Skin:    General: Skin is warm.     Capillary Refill: Capillary refill takes less than 2 seconds.  Neurological:     Mental Status: She is alert.     Comments: Mild left facial droop Right arm without drift Left arm with spastic hemiplegia Right leg against gravity without drift Left leg in splint and able to lift against gravity but not hole Speech slurred but family states at baseline  Psychiatric:        Mood and Affect: Mood normal.        Behavior: Behavior normal.     ED Results / Procedures / Treatments   Labs (all labs ordered are listed, but only abnormal results are displayed) Labs Reviewed  I-STAT CHEM 8, ED - Abnormal; Notable for the following  components:      Result Value   Glucose, Bld 114 (*)    Calcium, Ion 1.10 (*)    All other components within normal limits  CBG MONITORING, ED - Abnormal; Notable for the following components:   Glucose-Capillary 108 (*)    All other components within normal limits  PROTIME-INR  APTT  CBC  DIFFERENTIAL  COMPREHENSIVE METABOLIC PANEL  CARBAMAZEPINE LEVEL, TOTAL  I-STAT BETA HCG BLOOD, ED ([facility], [facility], [facility] ONLY)    EKG EKG Interpretation  Date/Time:  Sunday September 14 2019 12:28:32 EST Ventricular Rate:  96 PR Interval:    QRS Duration: 88 QT Interval:  381 QTC Calculation: 482 R Axis:   57 Text Interpretation: Sinus rhythm Abnormal R-wave progression, early transition No significant change since last tracing Confirmed by Pattricia Boss [phone]) on 09/14/2019 3:16:38 PM   Radiology CT HEAD CODE STROKE WO CONTRAST  Result Date: 09/14/2019 CLINICAL DATA:  Code stroke. Neurological deficit. Acute stroke presentation. EXAM: CT HEAD WITHOUT CONTRAST TECHNIQUE: Contiguous axial images were obtained from the base of the skull through the vertex without intravenous contrast. COMPARISON:  None. FINDINGS: Brain: Normal appearance without evidence of atrophy, old or acute infarction, mass lesion, hemorrhage, hydrocephalus or extra-axial collection. Somewhat diminutive posterior fossa on a congenital basis. Vascular: No abnormal vascular finding. Skull: Normal Sinuses/Orbits: Clear/normal Other: None ASPECTS ([place] Stroke Program Early CT Score) - Ganglionic level infarction (caudate, lentiform nuclei, internal capsule, insula, M1-M3 cortex): 7 - Supraganglionic infarction (M4-M6 cortex): 3 Total score (0-10 with 10 being normal): 10 IMPRESSION: 1. Normal head CT.  No acute finding. 2. ASPECTS is 10. 3. These results were communicated to Dr. Rory Percy at 12:23 pmon 1/31/2021by text page via the [facility] messaging system. Electronically Signed   By: Nelson Chimes M.D.   On: 09/14/2019 12:23     Procedures Procedures (including critical care time)  Medications Ordered in ED Medications  sodium chloride flush (NS) 0.9 % injection 3 mL (has no administration in time range)    ED Course  I have reviewed the triage vital signs and the nursing notes.  Pertinent labs & imaging results that were available during my care of the patient were reviewed by me and considered in my medical decision making (see chart for details). REviewed neuro note and feel stable from neuro to go home if levels of antiseizure meds normal and ammonia normal.  Reviewed and normal  x 3 Patient    MDM Rules/Calculators/A&P                      Patient seen as code stroke with neuro.  Remainder of work up normal. Suspect seizure with complaints of tongue abrasion and symptoms which have rapidly resolved. NO evidence of underlying infection.  Please see neuro for consult.  No other acute abnormalities noted and appears stable for d/c  Final Clinical Impression(s) / ED Diagnoses Final diagnoses:  None    Rx / DC Orders ED Discharge Orders    None       Margarita Grizzle, MD 09/14/19 1525

## 2019-09-14 NOTE — ED Notes (Signed)
Pt reports she was laying down watching TV and she bite her tongue and then it hurt to talk.

## 2019-09-14 NOTE — Consult Note (Addendum)
Neurology Consultation  Reason for Consult: Aphasia, facial weakness  Referring Physician: Dr Jeanell Sparrow, EDP  CC: Aphasia, facial weakness  History is obtained from: Legal guardian, chart  HPI: Patricia White is a 49 y.o. 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 Her legal guardian, who she lives with was contacted.  She reports that the patient had been doing well, no changes in her diet or appetite, no fevers chills.  No other sicknesses prior to this happening.  She noted all of a sudden, she was sitting and had right-sided facial droop and difficulty with her words. At baseline, the patient has spastic left hemiparesis. On EMS arrival, she had a difficult time following commands and talking.  No gaze deviation or preference.  Blood pressure 158/112. Patient had been complaining of a headache prior to all the started and complained of headache enroute to the EMTs as well. At the emergency room bridge, patient was able to follow all commands.  See detailed exam below.  She saw her outpatient neurologist on 09/09/2019.  Medications remain unchanged.  There was concern for jerking of the whole body-questionable myoclonus but no seizures.  Last seizure December 2019.  It was mentioned that her blood pressures were high on 2 occasions in the last 2 clinic visits and she was asked to see primary care for blood pressure management. Carbamazepine level was checked-10.7 on 09/09/2019 Depakote level was checked-74.  On 09/09/2019  LKW: 11:30 AM on 09/14/2019 tpa given?: no, likely seizure and less likely to be a stroke Premorbid modified Rankin scale (mRS):3  ROS:  Unable to obtain due to altered mental status.   Past Medical History:   Diagnosis Date  . Asthma   . Cerebral palsy ([place]) Birth  . Mental retardation Birth  . Seizures ([place])     Family History  Problem Relation Age of Onset  . Migraines Mother   . Cancer Mother        liver cancer  . Diabetes Maternal Aunt   . Hypertension Maternal Grandmother    Social History:   reports that she has never smoked. She has never used smokeless tobacco. She reports that she does not drink alcohol or use drugs.  Medications  Current Facility-Administered Medications:  .  sodium chloride flush (NS) 0.9 % injection 3 mL, 3 mL, Intravenous, Once, Pattricia Boss, MD  Current Outpatient Medications:  .  carbamazepine (TEGRETOL) 200 MG tablet, TAKE 1&1/2 TABLETS BY MOUTH TWICE A DAY, Disp: 270 tablet, Rfl: 1 .  DEPAKOTE ER 250 MG 24 hr tablet, TAKE 2 TABLETS BY MOUTH TWICE A DAY, Disp: 360 tablet, Rfl: 1 .  diphenhydrAMINE (BENADRYL) 25 MG tablet, Take 1 tablet (25 mg total) by mouth every 8 (eight) hours as needed for itching. (Patient not taking: Reported on 09/09/2019), Disp: 30 tablet, Rfl: 0 .  permethrin (ELIMITE) 5 % cream, Apply head to toe and leave on for 8 to 12 hours and then wash off. May repeat in 2 weeks if needed. (Patient not taking: Reported on 09/09/2019), Disp: 60 g, Rfl: 1 .  TOPAMAX 25 MG tablet, TAKE 3 TABLETS BY MOUTH TWICE A DAY, Disp: 540 tablet, Rfl: 1   Exam: Current vital signs: BP (!) 163/106   Pulse 97   Temp 98.5 F (  36.9 C) (Oral)   Resp 20   SpO2 97%  Vital signs in last 24 hours: Temp:  [98.5 F (36.9 C)] 98.5 F (36.9 C) (01/31 1231) Pulse Rate:  [97] 97 (01/31 1231) Resp:  [20] 20 (01/31 1231) BP: (163)/(106) 163/106 (01/31 1231) SpO2:  [97 %] 97 % (01/31 1231) General: Awake alert in no distress HEENT: Normocephalic atraumatic, very poor oral dentition CVS: Regular rate rhythm Respiratory: Breathing normally saturating well on room air Abdomen: Nondistended nontender Neurological exam She is awake, alert to self. Could  not tell me the date She is able to repeat some sentences. Her speech is mildly dysarthric She appears to have poor attention concentration She follows all commands. Cranial nerves: Pupils equal round reactive to light, extraocular movements intact, visual fields are full, mild left facial weakness, tongue and palate midline. Motor exam: Spastic left hemiparesis with left upper extremity proximal strength 3-4/5 and contractures at the elbow and the small muscles of the hand. Left lower extremity-in a AFO.  She is able to lift her hip antigravity again 3-4/5. Right side full strength. Sensory exam: Intact Coordination: No ataxia NIH stroke scale 1a Level of Conscious.: 0 1b LOC Questions: 1 1c LOC Commands: 0 2 Best Gaze: 0 3 Visual: 0 4 Facial Palsy: 1 5a Motor Arm - left: 1 5b Motor Arm - Right: 0 6a Motor Leg - Left: 2 6b Motor Leg - Right: 0 7 Limb Ataxia: 0 8 Sensory: 0 9 Best Language: 1 10 Dysarthria: 1 11 Extinct. and Inatten.: 0 TOTAL: 7  Labs I have reviewed labs in epic and the results pertinent to this consultation are:  CBC    Component Value Date/Time   WBC 5.4 08/05/2018 1013   WBC 6.3 07/08/2018 2305   RBC 4.17 08/05/2018 1013   RBC 3.96 07/08/2018 2305   HGB 12.7 08/05/2018 1013   HCT 38.4 08/05/2018 1013   PLT 258 08/05/2018 1013   MCV 92 08/05/2018 1013   MCH 30.5 08/05/2018 1013   MCH 30.3 07/08/2018 2305   MCHC 33.1 08/05/2018 1013   MCHC 30.0 07/08/2018 2305   RDW 12.9 08/05/2018 1013   LYMPHSABS 2.6 07/08/2018 2305   LYMPHSABS 1.3 05/07/2017 1111   MONOABS 0.5 07/08/2018 2305   EOSABS 0.1 07/08/2018 2305   EOSABS 0.0 05/07/2017 1111   BASOSABS 0.0 07/08/2018 2305   BASOSABS 0.0 05/07/2017 1111    CMP     Component Value Date/Time   NA 138 09/09/2019 1210   K 4.0 09/09/2019 1210   CL 102 09/09/2019 1210   CO2 23 09/09/2019 1210   GLUCOSE 91 09/09/2019 1210   GLUCOSE 133 (H) 07/08/2018 2305   BUN 5 (L) 09/09/2019 1210   CREATININE  0.80 09/09/2019 1210   CALCIUM 9.0 09/09/2019 1210   PROT 7.8 09/09/2019 1210   ALBUMIN 4.1 09/09/2019 1210   AST 10 09/09/2019 1210   ALT 4 09/09/2019 1210   ALKPHOS 89 09/09/2019 1210   BILITOT <0.2 09/09/2019 1210   GFRNONAA 89 09/09/2019 1210   GFRAA 102 09/09/2019 1210    Lipid Panel     Component Value Date/Time   CHOL 146 10/19/2009 1830   TRIG 85 10/19/2009 1830   HDL 55 10/19/2009 1830   CHOLHDL 2.7 Ratio 10/19/2009 1830   VLDL 17 10/19/2009 1830   LDLCALC 74 10/19/2009 1830   Imaging I have reviewed the images obtained: CT-scan of the brain-no acute changes  Assessment: 49 year old woman past medical history of static  encephalopathy and developmental delay, residual left spastic hemiparesis, seizure disorder-currently on carbamazepine, Depakote and Topamax with sudden onset of difficulty talking and right facial droop. No lateralized weakness noted. No recent seizure activity There has been some question of whole body twitching over the past few months for which they have been in touch with outpatient neurologist. Also of note, patient's blood pressures have been elevated and she has been asked to see primary care for treatment of hypertension. Today's episode could reflect a seizure.  I do not suspect this episode was a stroke or TIA given her known history of static encephalopathy and seizures.   Impression: #Episode of word finding difficulty/aphasia and facial droop.-Evaluate for breakthrough seizure versus underlying infection causing breakthrough seizure and known seizure patient. #Known history of seizures, static encephalopathy and developmental delay #Residual left spastic hemiparesis from static encephalopathy  Recommendations: Check basic labs-BMP, CBC Check ammonia level Check Depakote level Check urinalysis Check chest x-ray EEG  If remain unremarkable, can be discharged home with outpatient follow-up   I will follow up on the labs with  you.  -- Milon Dikes, MD [facility] Pager: [phone] If 7pm to 7am, please call on call as listed on AMION.

## 2019-09-15 ENCOUNTER — Telehealth: Payer: Self-pay

## 2019-09-15 NOTE — Progress Notes (Signed)
I reviewed note and agree with plan.   Brad Mcgaughy R. Truda Staub, MD 09/15/2019, 9:55 AM Certified in Neurology, Neurophysiology and Neuroimaging  [facility] [address], [address] [place], [place] [address] [phone]  

## 2019-09-15 NOTE — Telephone Encounter (Signed)
Pt's aunt calls nurse line with concerns for rectal bleeding. Per aunt, patient's PCA has been noticing small amount of rectal bleeding when she is cleaning patient for the past two weeks. No large amounts of blood noted in underwear, only when patient is wiped.   Scheduled appointment tomorrow morning.  Veronda Prude, RN

## 2019-09-16 ENCOUNTER — Other Ambulatory Visit: Payer: Self-pay

## 2019-09-16 ENCOUNTER — Ambulatory Visit (INDEPENDENT_AMBULATORY_CARE_PROVIDER_SITE_OTHER): Payer: Medicare Other | Admitting: Family Medicine

## 2019-09-16 VITALS — BP 132/84 | HR 95 | Wt 184.6 lb

## 2019-09-16 DIAGNOSIS — K648 Other hemorrhoids: Secondary | ICD-10-CM | POA: Diagnosis not present

## 2019-09-16 DIAGNOSIS — Z800 Family history of malignant neoplasm of digestive organs: Secondary | ICD-10-CM

## 2019-09-16 MED ORDER — HYDROCORTISONE ACETATE 25 MG RE SUPP: 25.0000 mg | Freq: Two times a day (BID) | RECTAL | 0 refills | Status: DC

## 2019-09-16 NOTE — Patient Instructions (Signed)
Dear Patricia White,   It was good to see you! Thank you for taking your time to come in to be seen. Today, we discussed the following:   Rectal Bleeding   Hemorrhoids- please see the three attachments below for further management. I have sent suppositories to the pharmacy to help with pain, itching and swelling.   Please take laxatives when you are constipated and add fiber to your diet. The ultimate treatment is to make bowel movements normal   You are due for the following Health Maintenance items. Please schedule an appointment to address these prior to leaving.  Health Maintenance Due  Topic Date Due  . HIV Screening  12/18/1987  . PAP SMEAR-Modifier  12/17/1993  . INFLUENZA VACCINE  03/15/2019    Be well,   Genia Hotter, M.D   [facility] [phone]  *Sign up for MyChart for instant access to your health profile, labs, orders, upcoming appointments or to contact your provider with questions*  ===================================================================================  Hemorrhoids Hemorrhoids are swollen veins in and around the rectum or anus. There are two types of hemorrhoids:  Internal hemorrhoids. These occur in the veins that are just inside the rectum. They may poke through to the outside and become irritated and painful.  External hemorrhoids. These occur in the veins that are outside the anus and can be felt as a painful swelling or hard lump near the anus. Most hemorrhoids do not cause serious problems, and they can be managed with home treatments such as diet and lifestyle changes. If home treatments do not help the symptoms, procedures can be done to shrink or remove the hemorrhoids. What are the causes? This condition is caused by increased pressure in the anal area. This pressure may result from various things, including:  Constipation.  Straining to have a bowel movement.  Diarrhea.  Pregnancy.  Obesity.  Sitting for long  periods of time.  Heavy lifting or other activity that causes you to strain.  Anal sex.  Riding a bike for a long period of time. What are the signs or symptoms? Symptoms of this condition include:  Pain.  Anal itching or irritation.  Rectal bleeding.  Leakage of stool (feces).  Anal swelling.  One or more lumps around the anus. How is this diagnosed? This condition can often be diagnosed through a visual exam. Other exams or tests may also be done, such as:  An exam that involves feeling the rectal area with a gloved hand (digital rectal exam).  An exam of the anal canal that is done using a small tube (anoscope).  A blood test, if you have lost a significant amount of blood.  A test to look inside the colon using a flexible tube with a camera on the end (sigmoidoscopy or colonoscopy). How is this treated? This condition can usually be treated at home. However, various procedures may be done if dietary changes, lifestyle changes, and other home treatments do not help your symptoms. These procedures can help make the hemorrhoids smaller or remove them completely. Some of these procedures involve surgery, and others do not. Common procedures include:  Rubber band ligation. Rubber bands are placed at the base of the hemorrhoids to cut off their blood supply.  Sclerotherapy. Medicine is injected into the hemorrhoids to shrink them.  Infrared coagulation. A type of light energy is used to get rid of the hemorrhoids.  Hemorrhoidectomy surgery. The hemorrhoids are surgically removed, and the veins that supply them are tied off.  Stapled hemorrhoidopexy surgery. The surgeon staples the base of the hemorrhoid to the rectal wall. Follow these instructions at home: Eating and drinking   Eat foods that have a lot of fiber in them, such as whole grains, beans, nuts, fruits, and vegetables.  Ask your health care provider about taking products that have added fiber (fiber  supplements).  Reduce the amount of fat in your diet. You can do this by eating low-fat dairy products, eating less red meat, and avoiding processed foods.  Drink enough fluid to keep your urine pale yellow. Managing pain and swelling   Take warm sitz baths for 20 minutes, 3-4 times a day to ease pain and discomfort. You may do this in a bathtub or using a portable sitz bath that fits over the toilet.  If directed, apply ice to the affected area. Using ice packs between sitz baths may be helpful. ? Put ice in a plastic bag. ? Place a towel between your skin and the bag. ? Leave the ice on for 20 minutes, 2-3 times a day. General instructions  Take over-the-counter and prescription medicines only as told by your health care provider.  Use medicated creams or suppositories as told.  Get regular exercise. Ask your health care provider how much and what kind of exercise is best for you. In general, you should do moderate exercise for at least 30 minutes on most days of the week (150 minutes each week). This can include activities such as walking, biking, or yoga.  Go to the bathroom when you have the urge to have a bowel movement. Do not wait.  Avoid straining to have bowel movements.  Keep the anal area dry and clean. Use wet toilet paper or moist towelettes after a bowel movement.  Do not sit on the toilet for long periods of time. This increases blood pooling and pain.  Keep all follow-up visits as told by your health care provider. This is important. Contact a health care provider if you have:  Increasing pain and swelling that are not controlled by treatment or medicine.  Difficulty having a bowel movement, or you are unable to have a bowel movement.  Pain or inflammation outside the area of the hemorrhoids. Get help right away if you have:  Uncontrolled bleeding from your rectum. Summary  Hemorrhoids are swollen veins in and around the rectum or anus.  Most hemorrhoids  can be managed with home treatments such as diet and lifestyle changes.  Taking warm sitz baths can help ease pain and discomfort.  In severe cases, procedures or surgery can be done to shrink or remove the hemorrhoids. This information is not intended to replace advice given to you by your health care provider. Make sure you discuss any questions you have with your health care provider. Document Revised: 12/27/2018 Document Reviewed: 12/20/2017 Elsevier Patient Education  [place]. Hydrocortisone suppositories What is this medicine? HYDROCORTISONE (hye droe KOR ti sone) is a corticosteroid. It is used to decrease swelling, itching, and pain that is caused by minor skin irritations or by hemorrhoids. This medicine may be used for other purposes; ask your health care provider or pharmacist if you have questions. COMMON BRAND NAME(S): Anucort-HC, Anumed-HC, Anusol HC, Encort, GRx HiCort, Hemmorex-HC, Hemorrhoidal-HC, Hemril, Proctocort, Proctosert HC, Proctosol-HC, Rectacort HC, Rectasol-HC What should I tell my health care provider before I take this medicine? They need to know if you have any of these conditions:  an unusual or allergic reaction to hydrocortisone, corticosteroids, other  medicines, foods, dyes, or preservatives  pregnant or trying to get pregnant  breast-feeding How should I use this medicine? This medicine is for rectal use only. Do not take by mouth. Wash your hands before and after use. Take off the foil wrapping. Wet the tip of the suppository with cold tap water to make it easier to use. Lie on your side with your lower leg straightened out and your upper leg bent forward toward your stomach. Lift upper buttock to expose the rectal area. Apply gentle pressure to insert the suppository completely into the rectum, pointed end first. Hold buttocks together for a few seconds. Remain lying down for about 15 minutes to avoid having the suppository come out. Do not use  more often than directed. Talk to your pediatrician regarding the use of this medicine in children. Special care may be needed. Overdosage: If you think you have taken too much of this medicine contact a poison control center or emergency room at once. NOTE: This medicine is only for you. Do not share this medicine with others. What if I miss a dose? If you miss a dose, use it as soon as you can. If it is almost time for your next dose, use only that dose. Do not use double or extra doses. What may interact with this medicine? Interactions are not expected. Do not use any other rectal products on the affected area without telling your doctor or health care professional. This list may not describe all possible interactions. Give your health care provider a list of all the medicines, herbs, non-prescription drugs, or dietary supplements you use. Also tell them if you smoke, drink alcohol, or use illegal drugs. Some items may interact with your medicine. What should I watch for while using this medicine? Visit your doctor or health care professional for regular checks on your progress. Tell your doctor or health care professional if your symptoms do not improve after a few days of use. Do not use if there is blood in your stools. If you get any type of infection while using this medicine, you may need to stop using this medicine until our infections clears up. Ask your doctor or health care professional for advice. What side effects may I notice from receiving this medicine? Side effects that you should report to your doctor or health care professional as soon as possible:  bloody or black, tarry stools  painful, red, pus filled blisters in hair follicles  rectal pain, burning or bleeding after use of medicine Side effects that usually do not require medical attention (report to your doctor or health care professional if they continue or are bothersome):  changes in skin color  dry  skin  itching or irritation This list may not describe all possible side effects. Call your doctor for medical advice about side effects. You may report side effects to FDA at 1-800-FDA-1088. Where should I keep my medicine? Keep out of the reach of children. Store at room temperature between 20 and 25 degrees C (68 and 77 degrees F). Protect from heat and freezing. Throw away any unused medicine after the expiration date. NOTE: This sheet is a summary. It may not cover all possible information. If you have questions about this medicine, talk to your doctor, pharmacist, or health care provider.  2020 Elsevier/Gold Standard (2007-12-13 16:07:24)  How to Take a Sitz Bath A sitz bath is a warm water bath that may be used to care for your rectum, genital area, or the  area between your rectum and genitals (perineum). For a sitz bath, the water only comes up to your hips and covers your buttocks. A sitz bath may done at home in a bathtub or with a portable sitz bath that fits over the toilet. Your health care provider may recommend a sitz bath to help:  Relieve pain and discomfort after delivering a baby.  Relieve pain and itching from hemorrhoids or anal fissures.  Relieve pain after certain surgeries.  Relax muscles that are sore or tight. How to take a sitz bath Take 3-4 sitz baths a day, or as many as told by your health care provider. Bathtub sitz bath To take a sitz bath in a bathtub: 1. Partially fill a bathtub with warm water. The water should be deep enough to cover your hips and buttocks when you are sitting in the tub. 2. If your health care provider told you to put medicine in the water, follow his or her instructions. 3. Sit in the water. 4. Open the tub drain a little, and leave it open during your bath. 5. Turn on the warm water again, enough to replace the water that is draining out. Keep the water running throughout your bath. This helps keep the water at the right level and  the right temperature. 6. Soak in the water for 15-20 minutes, or as long as told by your health care provider. 7. When you are done, be careful when you stand up. You may feel dizzy. 8. After the sitz bath, pat yourself dry. Do not rub your skin to dry it.  Over-the-toilet sitz bath To take a sitz bath with an over-the-toilet basin: 1. Follow the manufacturer's instructions. 2. Fill the basin with warm water. 3. If your health care provider told you to put medicine in the water, follow his or her instructions. 4. Sit on the seat. Make sure the water covers your buttocks and perineum. 5. Soak in the water for 15-20 minutes, or as long as told by your health care provider. 6. After the sitz bath, pat yourself dry. Do not rub your skin to dry it. 7. Clean and dry the basin between uses. 8. Discard the basin if it cracks, or according to the manufacturer's instructions. Contact a health care provider if:  Your symptoms get worse. Do not continue with sitz baths if your symptoms get worse.  You have new symptoms. If this happens, do not continue with sitz baths until you talk with your health care provider. Summary  A sitz bath is a warm water bath in which the water only comes up to your hips and covers your buttocks.  A sitz bath may help relieve itching, relieve pain, and relax muscles that are sore or tight in the lower part of your body, including your genital area.  Take 3-4 sitz baths a day, or as many as told by your health care provider. Soak in the water for 15-20 minutes.  Do not continue with sitz baths if your symptoms get worse. This information is not intended to replace advice given to you by your health care provider. Make sure you discuss any questions you have with your health care provider. Document Revised: 12/30/2018 Document Reviewed: 08/02/2017 Elsevier Patient Education  2020 [employer].

## 2019-09-16 NOTE — Assessment & Plan Note (Addendum)
Hemorrhoid appreciated on physical exam.  Patient's history is also consistent with this diagnosis.  Counseled patient and patient's mother on maximizing normal bowel movements, trying to limit constipation or any increased abdominal pressure.  For the hemorrhoid, sent hydrocortisone suppositories to the pharmacy with directions for use.  Also provided information about hemorrhoids and sitz bath's.  Patient provided return precautions.  Additionally, given family history (grandmother) of colon cancer, I would recommend that patient obtain colonoscopy.  Called patient's guardian, who agrees with this plan.  Referral to gastroenterology.

## 2019-09-16 NOTE — Progress Notes (Signed)
   CHIEF COMPLAINT / HPI: Hemorrhoids Bleeding from anus every time she wipes. Also reports pain when with certain positions while sitting down. Denies blood that soaks through pants or sheets. Reports hx of constipation.  There is no gross blood mixed in with the stool.  She denies any black bowel movements. Patient is accompanied by her legal guardian.  PERTINENT  PMH / PSH: CP  OBJECTIVE: BP 132/84   Pulse 95   Wt 184 lb 9.6 oz (83.7 kg)   SpO2 99%   BMI 32.70 kg/m   Well-appearing female, no acute distress. Rectal: Anus appears normal on external exam. There are no external hemorrhoids, rashes or trauma appreciated. On DRE, there is normal sphincter tone.  There is a soft ballotable 1 to 2 cm area at 5:00.  There was no frank blood on glove after digital exam. There is stool in the rectum.    ASSESSMENT / PLAN:  Hemorrhoids, internal, with bleeding Hemorrhoid appreciated on physical exam.  Patient's history is also consistent with this diagnosis.  Counseled patient and patient's mother on maximizing normal bowel movements, trying to limit constipation or any increased abdominal pressure.  For the hemorrhoid, sent hydrocortisone suppositories to the pharmacy with directions for use.  Also provided information about hemorrhoids and sitz bath's.  Patient provided return precautions.  Additionally, given family history (grandmother) of colon cancer, I would recommend that patient obtain colonoscopy.  Called patient's guardian, who agrees with this plan.  Referral to gastroenterology.    Melene Plan, MD [facility]

## 2019-09-20 NOTE — Progress Notes (Signed)
Please have patient schedule appointment at earliest convenience for hypertension evaluation. Thank you.

## 2019-09-24 ENCOUNTER — Telehealth: Payer: Self-pay | Admitting: *Deleted

## 2019-09-24 NOTE — Telephone Encounter (Signed)
-----   Message from Joana Reamer, [place] sent at 09/20/2019  3:48 PM EST -----   ----- Message ----- From: Shawnie Dapper, NP Sent: 09/09/2019   2:47 PM EST To: Joana Reamer, DO

## 2019-09-24 NOTE — Telephone Encounter (Signed)
Pt informed and scheduled. Dewanda Fennema, CMA  

## 2019-09-25 ENCOUNTER — Encounter: Payer: Self-pay | Admitting: Gastroenterology

## 2019-09-26 ENCOUNTER — Telehealth: Payer: Self-pay | Admitting: *Deleted

## 2019-09-26 NOTE — Telephone Encounter (Signed)
Dr. Lavon Paganini,  Would you please review this pt's chart- she is due for a PV on 09-30-19 for a colonoscopy on 10-14-19.  She does have a rather complicated medical hx and I would just like you to review it before her PV please to make sure she is ok for [facility] or if you'd like at OV first.  Thanks, [employer]

## 2019-09-30 ENCOUNTER — Telehealth: Payer: Self-pay | Admitting: *Deleted

## 2019-09-30 NOTE — Telephone Encounter (Signed)
Spoke with [name], patient's guardian.  Advised pt needed office visit before proceeding with colonoscopy,  OV with [facility] 2/19 @ 1150.

## 2019-09-30 NOTE — Telephone Encounter (Signed)
Please switch to office visit with me or extender /APP, next available appointment. Thanks

## 2019-10-03 ENCOUNTER — Ambulatory Visit: Payer: Medicare Other | Admitting: Nurse Practitioner

## 2019-10-06 ENCOUNTER — Other Ambulatory Visit: Payer: Medicare Other

## 2019-10-06 ENCOUNTER — Encounter: Payer: Self-pay | Admitting: Family Medicine

## 2019-10-06 ENCOUNTER — Other Ambulatory Visit: Payer: Self-pay | Admitting: Adult Health

## 2019-10-06 ENCOUNTER — Other Ambulatory Visit: Payer: Self-pay

## 2019-10-06 ENCOUNTER — Ambulatory Visit (INDEPENDENT_AMBULATORY_CARE_PROVIDER_SITE_OTHER): Payer: Medicare Other | Admitting: Family Medicine

## 2019-10-06 VITALS — BP 132/84 | HR 92 | Wt 181.6 lb

## 2019-10-06 DIAGNOSIS — Z23 Encounter for immunization: Secondary | ICD-10-CM

## 2019-10-06 DIAGNOSIS — K648 Other hemorrhoids: Secondary | ICD-10-CM

## 2019-10-06 DIAGNOSIS — Z114 Encounter for screening for human immunodeficiency virus [HIV]: Secondary | ICD-10-CM

## 2019-10-06 DIAGNOSIS — Z124 Encounter for screening for malignant neoplasm of cervix: Secondary | ICD-10-CM | POA: Diagnosis not present

## 2019-10-06 DIAGNOSIS — E669 Obesity, unspecified: Secondary | ICD-10-CM | POA: Diagnosis not present

## 2019-10-06 DIAGNOSIS — R030 Elevated blood-pressure reading, without diagnosis of hypertension: Secondary | ICD-10-CM | POA: Diagnosis not present

## 2019-10-06 DIAGNOSIS — G40909 Epilepsy, unspecified, not intractable, without status epilepticus: Secondary | ICD-10-CM

## 2019-10-06 NOTE — Progress Notes (Signed)
Subjective:   Patient ID: Patricia White    DOB: 05/01/1971, 49 y.o. female   MRN: [id number]  Patricia White is a 49 y.o. 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  Elevated Blood Pressure: Patient noted to have several elevated blood pressures in 150s/100s.  She is currently not on any antihypertensives.  Denies any chest pain, shortness of breath, lower extremity swelling, headaches, or vision changes.  Blood pressure 132/84 today.  Last metabolic panel obtained in January 2020 with good kidney function.  Health maintenance: Due for HIV, flu vaccine, Pap smear.  Caregiver notes that patient is not sexually active. Per aunt, she would not tolerate a pap-smear without being sedated.   Abnormal Periods LMP ~09/29/19. Lasted few days, heavy 3-4 pads/day x 3 days. Has irregular periods, did not have a period in January. Denies any pelvic cramps or discharge. Denies bleeding between periods. Family history of menopause in 51's.   Review of Systems:  Per HPI.   PMFSH, medications and smoking status reviewed.  Objective:   BP 132/84   Pulse 92   Wt 181 lb 9.6 oz (82.4 kg)   SpO2 99%   BMI 32.17 kg/m  Vitals and nursing note reviewed.  General: Pleasant younger female, well nourished, well developed, in no acute distress with non-toxic appearance CV: regular rate and rhythm without murmurs, rubs, or gallops, 2+ radial pulses Lungs: clear to auscultation bilaterally with normal work of breathing Skin: warm, dry Extremities: warm and well perfused Neuro: Does not speak very much during exam  Assessment & Plan:   Blood pressure elevated without history of HTN History of elevated blood pressures in the 150s/100s, blood pressures were more normotensive today.  Denies any symptoms when blood pressures are elevated.  Not currently  on any antihypertensives.  Most recent labs with normal kidney function.  Recommended patient's caregiver (aunt) to monitor blood pressures 1-2 times a day for the next 2 weeks and keep a log.  We will plan to follow-up in 2 weeks to review blood pressures.  Obesity (BMI 30.0-34.9) BMI 32.17.  Lipid panel obtained today and all within normal limits  Seizure disorder (HCC) Continues to follow closely with [facility] with close lab monitoring.  -Continue current plan  Hemorrhoids, internal, with bleeding Notes improvement in bleeding, with very minimal at this time.  Has appointment with gastroenterology for colonoscopy.  We will continue to follow.  Abnormal Periods: Irregular periods with moderate flow.  Last menstrual cycle on approximately 2/15.  Family history of early onset menopause in the early to mid 35s.  Denies any other symptoms.  Not currently on birth control and not sexually active.  Unclear etiology for abnormal periods however appears most consistent with possible perimenopause given family history.  We will continue to monitor.   Health maintenance: -HIV negative -Referral to gynecology for Pap smear given she will require sedation for procedure -Flu vaccine given today  Orders Placed This Encounter  Procedures  . Flu Vaccine QUAD 36+ mos IM  . Lipid panel  . HIV Antibody (routine testing w rflx)  . Ambulatory referral to Gynecology    Referral Priority:   Routine    Referral Type:   Consultation    Referral Reason:   Specialty Services Required    Requested Specialty:   Gynecology    Number of Visits Requested:   1  Mina Marble, DO PGY-2, [facility] 10/07/2019 8:50 AM

## 2019-10-06 NOTE — Patient Instructions (Signed)
Thank you for coming to see me today. It was a pleasure to see you.   Please check Patricia White's blood pressure 1-2 times a day and keep a log.  We will follow up in 2 week to review.  I have obtained labs today and will call if anything is abnormal.  Please follow-up with me in 1 year for annual exam.   If you have any questions or concerns, please do not hesitate to call the office at [phone].  Take Care,   Dr. Orpah Cobb, DO Resident Physician [facility] [phone]

## 2019-10-07 ENCOUNTER — Encounter: Payer: Self-pay | Admitting: Nurse Practitioner

## 2019-10-07 ENCOUNTER — Ambulatory Visit (INDEPENDENT_AMBULATORY_CARE_PROVIDER_SITE_OTHER): Payer: Medicare Other | Admitting: Nurse Practitioner

## 2019-10-07 VITALS — BP 140/84 | HR 72 | Temp 98.3°F | Ht 62.0 in | Wt 179.6 lb

## 2019-10-07 DIAGNOSIS — Z1211 Encounter for screening for malignant neoplasm of colon: Secondary | ICD-10-CM

## 2019-10-07 DIAGNOSIS — Z01818 Encounter for other preprocedural examination: Secondary | ICD-10-CM | POA: Diagnosis not present

## 2019-10-07 DIAGNOSIS — K5909 Other constipation: Secondary | ICD-10-CM

## 2019-10-07 DIAGNOSIS — R030 Elevated blood-pressure reading, without diagnosis of hypertension: Secondary | ICD-10-CM | POA: Insufficient documentation

## 2019-10-07 LAB — LIPID PANEL
Chol/HDL Ratio: 3.3 ratio (ref 0.0–4.4)
Cholesterol, Total: 173 mg/dL (ref 100–199)
HDL: 53 mg/dL (ref >39)
LDL Chol Calc (NIH): 95 mg/dL (ref 0–99)
Triglycerides: 143 mg/dL (ref 0–149)
VLDL Cholesterol Cal: 25 mg/dL (ref 5–40)

## 2019-10-07 LAB — HIV ANTIBODY (ROUTINE TESTING W REFLEX): HIV Screen 4th Generation wRfx: NONREACTIVE

## 2019-10-07 MED ORDER — NA SULFATE-K SULFATE-MG SULF 17.5-3.13-1.6 GM/177ML PO SOLN: ORAL | 0 refills | Status: DC

## 2019-10-07 NOTE — Assessment & Plan Note (Addendum)
BMI 32.17.  Lipid panel obtained today and all within normal limits

## 2019-10-07 NOTE — Assessment & Plan Note (Addendum)
History of elevated blood pressures in the 150s/100s, blood pressures were more normotensive today.  Denies any symptoms when blood pressures are elevated.  Not currently on any antihypertensives.  Most recent labs with normal kidney function.  Recommended patient's caregiver (aunt) to monitor blood pressures 1-2 times a day for the next 2 weeks and keep a log.  We will plan to follow-up in 2 weeks to review blood pressures.

## 2019-10-07 NOTE — Patient Instructions (Signed)
If you are age 47 or older, your body mass index should be between 23-30. Your Body mass index is 32.85 kg/m. If this is out of the aforementioned range listed, please consider follow up with your Primary Care Provider.  If you are age 49 or younger, your body mass index should be between 19-25. Your Body mass index is 32.85 kg/m. If this is out of the aformentioned range listed, please consider follow up with your Primary Care Provider.   You have been scheduled for a colonoscopy. Please follow written instructions given to you at your visit today.  Please pick up your prep supplies at the pharmacy within the next 1-3 days. If you use inhalers (even only as needed), please bring them with you on the day of your procedure. Your physician has requested that you go to www.startemmi.com and enter the access code given to you at your visit today. This web site gives a general overview about your procedure. However, you should still follow specific instructions given to you by our office regarding your preparation for the procedure.  We have sent the following medications to your pharmacy for you to pick up at your convenience: [employer] daily.  Thank you for choosing me and [facility].   Willette Cluster, NP

## 2019-10-07 NOTE — Progress Notes (Signed)
Referring Provider: Genia Hotter, MD  Reason for Referral : Colon cancer screening              ASSESSMENT / PLAN:   Patricia White is a 49 y.o. female with a pmh significant for, but not necessarily limited to, cerebral palsy and seizures  # colon cancer screening. This will be her first colonoscopy.  -The risks and benefits of colonoscopy with possible polypectomy / biopsies were discussed and the patient agrees to proceed.  -Since recent seizure activity not confirmed will go ahead with screening colonoscopy unless Dr. Lavon Paganini has concerns.   # Hx of seizures.   -Some "jerking" at home but after reading outpatient Neurology note in late January these movements are felt to be more consistent with myoclonus. Seen in ED late January with aphasia, tongue abrasion. CVA / TIA felt unlikely. Seizure couldn't be ruled out. Sounds like last documented seizure was in 2019.    # Cerebral palsy  -Aunt is caregiver but patient performs a lot of self care.   # chronic constipation.  -Frequently takes Ex-lax -Aunt doesn't think fiber intake is adequate. Will add daily Benefiber  HPI:     Chief Complaint:   None. Here to be seen prior to colonoscopy  **History comes from the chart, patient's aunt (caregiver) and partially from patient.   49 yo female here to be evaluated for colonoscopy. She was scheduled for a direct procedure but due to medical history an office visit prior to procedure seemed most appropriate.  According to the patient's aunt, patient cerebral palsy has been stable for years.  The patient is fairly independent at home.  Her last seizure was 1 to 2 years ago.  No GI complaints other than chronic constipation.  Takes Ex-Lax on a regular basis.  She drinks 2 bottles of water a day.  Aunt doubts fiber intake is adequate  Patient seen in ED 09/14/2019 with aphasia and facial weakness .  Evalulated by Neurology.  Stroke nor TIA not suspected, no acute findings on head CT scan.  Seizure on list of differential diagnosis especially given a tongue abrasion.  No evidence for underlying infection. Discharged home from ED.   Data Reviewed:  09/14/19 ED visit for neurologic symptoms BMP remarkable for ionized calcium slightly low at 1.1, BUN and creatinine normal CBC normal Ammonia 51    Past Medical History:  Diagnosis Date  . Asthma   . Cerebral palsy (HCC) Birth  . Mental retardation Birth  . Seizures (HCC)      History reviewed. No pertinent surgical history. Family History  Problem Relation Age of Onset  . Migraines Mother   . Cancer Mother        liver cancer  . Diabetes Maternal Aunt   . Hypertension Maternal Grandmother   . Colon cancer Maternal Grandfather 60  . Prostate cancer Maternal Grandfather   . Throat cancer Maternal Grandfather    Social History   Tobacco Use  . Smoking status: Never Smoker  . Smokeless tobacco: Never Used  Substance Use Topics  . Alcohol use: No  . Drug use: No   Current Outpatient Medications  Medication Sig Dispense Refill  . carbamazepine (TEGRETOL) 200 MG tablet TAKE 1&1/2 TABLETS BY MOUTH TWICE A DAY (Patient taking differently: Take 300 mg by mouth 2 (two) times daily. ) 270 tablet 1  . DEPAKOTE ER 250 MG 24 hr tablet TAKE 2 TABLETS BY MOUTH TWICE A DAY (Patient taking differently: Take 500  mg by mouth 2 (two) times daily. ) 360 tablet 1  . hydrocortisone (ANUSOL-HC) 25 MG suppository Place 1 suppository (25 mg total) rectally 2 (two) times daily. 12 suppository 0  . TOPAMAX 25 MG tablet TAKE 3 TABLETS BY MOUTH TWICE A DAY (Patient taking differently: Take 75 mg by mouth 2 (two) times daily. ) 540 tablet 1   No current facility-administered medications for this visit.   No Known Allergies   Review of Systems: All systems reviewed and negative except where noted in HPI.   Creatinine clearance cannot be calculated (Patient's most recent lab result is older than the maximum 21 days allowed.)   Physical  Exam:    Wt Readings from Last 3 Encounters:  10/07/19 179 lb 9.6 oz (81.5 kg)  10/06/19 181 lb 9.6 oz (82.4 kg)  09/16/19 184 lb 9.6 oz (83.7 kg)    BP 140/84   Pulse 72   Temp 98.3 F (36.8 C)   Ht 5\' 2"  (1.575 m)   Wt 179 lb 9.6 oz (81.5 kg)   BMI 32.85 kg/m  Constitutional:  Pleasant female in no acute distress. Psychiatric: Gives yes or no answers appropriately.  Behavior is normal. EENT: Pupils normal.  Conjunctivae are normal. No scleral icterus. Neck supple.  Cardiovascular: Normal rate, regular rhythm. No edema Pulmonary/chest: Effort normal and breath sounds normal. No wheezing, rales or rhonchi. Abdominal: Soft, nondistended, nontender. Bowel sounds active throughout. There are no masses palpable. No hepatomegaly. Neurological: left hemiparesis. Skin: Skin is warm and dry. No rashes noted.  Tye Savoy, NP  10/07/2019, 11:10 AM  Cc:  Referring Provider Zettie Cooley, MD

## 2019-10-07 NOTE — Assessment & Plan Note (Signed)
Continues to follow closely with [facility] with close lab monitoring.  -Continue current plan

## 2019-10-07 NOTE — Assessment & Plan Note (Signed)
Notes improvement in bleeding, with very minimal at this time.  Has appointment with gastroenterology for colonoscopy.  We will continue to follow.

## 2019-10-08 NOTE — Progress Notes (Signed)
Reviewed and agree with documentation and assessment and plan. K. Veena Lorene Samaan , MD   

## 2019-10-10 ENCOUNTER — Ambulatory Visit (INDEPENDENT_AMBULATORY_CARE_PROVIDER_SITE_OTHER): Payer: Medicare Other

## 2019-10-10 ENCOUNTER — Other Ambulatory Visit: Payer: Self-pay

## 2019-10-10 DIAGNOSIS — Z1159 Encounter for screening for other viral diseases: Secondary | ICD-10-CM | POA: Diagnosis not present

## 2019-10-11 LAB — SARS CORONAVIRUS 2 (TAT 6-24 HRS): SARS Coronavirus 2: NEGATIVE

## 2019-10-13 ENCOUNTER — Telehealth: Payer: Self-pay | Admitting: Family Medicine

## 2019-10-13 NOTE — Telephone Encounter (Signed)
Pt's friend is dropping off forms to be filled out by the doctor. The forms have been placed in the red team folder at the front desk. jw

## 2019-10-14 ENCOUNTER — Encounter: Payer: Self-pay | Admitting: Family Medicine

## 2019-10-14 ENCOUNTER — Encounter: Payer: Medicare Other | Admitting: Gastroenterology

## 2019-10-14 NOTE — Telephone Encounter (Signed)
Viewed [employer] request forms and placed in PCP's box for completion.  Glennie Hawk, CMA

## 2019-10-14 NOTE — Telephone Encounter (Signed)
Form completed and given to Jone Baseman, CMA.

## 2019-10-15 NOTE — Telephone Encounter (Signed)
Form placed in to be faxed pile.  To be faxed to [facility] [employer] @ [phone].  Copy made for batch scanning and to be mailed to pt as requested. Jone Baseman, CMA

## 2019-10-17 NOTE — Telephone Encounter (Signed)
Opened in error  .Michelle R Simpson, CMA  

## 2019-10-23 ENCOUNTER — Telehealth (INDEPENDENT_AMBULATORY_CARE_PROVIDER_SITE_OTHER): Payer: Medicare Other | Admitting: Family Medicine

## 2019-10-23 ENCOUNTER — Other Ambulatory Visit: Payer: Self-pay

## 2019-10-23 DIAGNOSIS — I10 Essential (primary) hypertension: Secondary | ICD-10-CM | POA: Diagnosis not present

## 2019-10-23 MED ORDER — INDAPAMIDE 1.25 MG PO TABS: 1.2500 mg | ORAL_TABLET | Freq: Every day | ORAL | 3 refills | Status: DC

## 2019-10-23 NOTE — Assessment & Plan Note (Addendum)
Uncontrolled. Multiple elevated blood pressures at home ranging from 140-160's/60-70's. No prior history of hypertension but has had elevated blood pressure readings in the office in the past. Uncertain prognosis. Will start Indapamide 1.25mg  QD. Follow up in 2 weeks for BMP and BP check.

## 2019-10-23 NOTE — Progress Notes (Signed)
[facility] Telemedicine Visit  Patient consented to have virtual visit. Method of visit: Video  Encounter participants: Patient: Patricia White - located at home Provider: Joana Reamer - located at  Others (if applicable): Aunt  Chief Complaint: Blood pressure  HPI: Patient following up on blood pressure today. She was noted to have several elevated blood pressures in prior visits, but was noted to be normotensive during last visit. She was instructed to keep a log of blood pressures at home. Aunt/caregiver notes BP have been: 140/60, 150/70, 130/60, 120/60, 140/60, 140/60, 150/40, 150/70, 160/50, 160/70, 150/70, 160/50. Aunt notes that she denies any headaches, chest pain, SOB, leg swelling, vision changes.   ROS: per HPI  Pertinent PMHx: obesity, elevated blood pressure  Exam:  Respiratory: Breathing comfortably on room air  Assessment/Plan:  Essential hypertension Multiple elevated blood pressures at home ranging from 140-160's/60-70's. No prior history of hypertension but has had elevated blood pressure readings in the office in the past. Will start Indapamide 1.25mg  QD. Follow up in 2 weeks for BMP and BP check.    Time spent during visit with patient: 15 minutes  Orpah Cobb, DO [facility], PGY2 10/23/19

## 2019-10-27 NOTE — Congregational Nurse Program (Signed)
Client going  to [facility] for covid vaccine,grandmother  unable to get [facility] ready, so I called [facility], they will see her anytime.  Arranged transportation for client and grandmother.  Patricia White was well behaved, smiling, agreeable, vaccine went well, she had no reactions to vaccine, 2nd one scheduled, got the moderna vaccine.  Walking with limp due to her rt sided weakness.Juliann Pulse, RN

## 2019-11-04 ENCOUNTER — Other Ambulatory Visit: Payer: Self-pay | Admitting: Adult Health

## 2019-11-05 NOTE — Progress Notes (Deleted)
   Subjective:   Patient ID: Patricia White    DOB: Nov 10, 1970, 49 y.o. female   MRN: [id number]  Patricia White is a 49 y.o. female with a history of hemorrhoids, infantile cerebral palsy with moderate intellectual diability and seizures followed by [facility] here for blood pressure follow up.  Essential Hypertension: Patient here today to follow up of blood pressure.   today. Currently on Indapamide 1.25mg  QD. Endorses compliance. Denies any chest pain, SOB, vision changes, or headaches. BP at home ***.  Review of Systems:  Per HPI.   Objective:   There were no vitals taken for this visit. Vitals and nursing note reviewed.  General: well nourished, well developed, in no acute distress with non-toxic appearance HEENT: normocephalic, atraumatic, moist mucous membranes Neck: supple, non-tender without lymphadenopathy CV: regular rate and rhythm without murmurs, rubs, or gallops, no lower extremity edema Lungs: clear to auscultation bilaterally with normal work of breathing Abdomen: soft, non-tender, non-distended, no masses or organomegaly palpable, normoactive bowel sounds Skin: warm, dry, no rashes or lesions Extremities: warm and well perfused, normal tone MSK: ROM grossly intact, strength intact, gait normal Neuro: Alert and oriented, speech normal  Assessment & Plan:   No problem-specific Assessment & Plan notes found for this encounter.  No orders of the defined types were placed in this encounter.  No orders of the defined types were placed in this encounter.   Orpah Cobb, DO PGY-2, [facility] 11/05/2019 2:14 PM

## 2019-11-07 ENCOUNTER — Ambulatory Visit: Payer: Medicare Other | Admitting: Family Medicine

## 2019-11-19 ENCOUNTER — Telehealth: Payer: Self-pay

## 2019-11-19 NOTE — Telephone Encounter (Signed)
Patient's aunt, Patricia White, calls nurse line regarding patient waking up with sore throat. Caregiver denies fever, cough, body aches, or sick exposures.   Patricia White says patient has history of seasonal allergies, however, due to sore throat patient scheduled virtual visit with provider tomorrow morning.   ED precautions given  To PCP and Dr. Nobie Putnam (provider for virtual visit)  Veronda Prude, RN

## 2019-11-20 ENCOUNTER — Encounter: Payer: Self-pay | Admitting: Family Medicine

## 2019-11-20 ENCOUNTER — Other Ambulatory Visit: Payer: Self-pay

## 2019-11-20 ENCOUNTER — Telehealth (INDEPENDENT_AMBULATORY_CARE_PROVIDER_SITE_OTHER): Payer: Medicare HMO | Admitting: Family Medicine

## 2019-11-20 DIAGNOSIS — J302 Other seasonal allergic rhinitis: Secondary | ICD-10-CM

## 2019-11-20 NOTE — Progress Notes (Signed)
[facility] Telemedicine Visit  Patient consented to have virtual visit. Method of visit: Video was attempted, but technology challenges prevented patient from using video, so visit was conducted via telephone.  Encounter participants: Patient: Patricia White - located at home  Provider: Derrel Nip - located at home  Others (if applicable): Aunt   Chief Complaint:  Patient's aunt who is her caregiver provides history  Sore throat Patient reportedly woke up with a sore throat yesterday morning.  She had no fever, cough, congestion, runny nose.  Patient's aunt reports that she has chronic issues with seasonal allergies and thinks this may be related to those allergies.  Has tried over-the-counter antihistamines in the past with good success.  Patient's aunt notes that when she does have seasonal allergies the patient's lips will swell a little bit which she noticed yesterday.  This is since resolved and her sore throat is also resolved.  Patient's aunt reports she looked in her throat and did not see any exudate or swelling in her tonsils.  Hypertension Patient recently started on indapamide.  Patient's onset she checked her blood pressures earlier in the week and that it was approximately the same in the 130s/60s but that she has not checked it since that time.  ROS: per HPI  Pertinent PMHx: Cerebral palsy, seasonal allergies  Exam:  Respiratory: Normal work of breathing per mother  Assessment/Plan:  Seasonal allergies Patient reportedly had sore throat yesterday morning.  History of issues with seasonal allergies and patient's caregiver feels that this is most likely what the causes.  Sore throat has resolved at this time.  Denies any fever, chills, cough, congestion, runny nose. -Over-the-counter antihistamines as needed for allergies -Consider addition of Flonase if symptoms persist -Monitor for signs and symptoms of infection -Strict return  precautions given    Time spent during visit with patient: 20 minutes

## 2019-11-20 NOTE — Assessment & Plan Note (Addendum)
Patient reportedly had sore throat yesterday morning.  History of issues with seasonal allergies and patient's caregiver feels that this is most likely what the causes.  Sore throat has resolved at this time.  Denies any fever, chills, cough, congestion, runny nose. -Over-the-counter antihistamines as needed for allergies -Consider addition of Flonase if symptoms persist -Monitor for signs and symptoms of infection -Strict return precautions given

## 2019-12-02 ENCOUNTER — Telehealth: Payer: Self-pay

## 2019-12-02 ENCOUNTER — Emergency Department (HOSPITAL_COMMUNITY)
Admission: EM | Admit: 2019-12-02 | Discharge: 2019-12-02 | Disposition: A | Discharge status: Home / Self Care | Payer: Medicare HMO | Attending: Emergency Medicine | Admitting: Emergency Medicine

## 2019-12-02 ENCOUNTER — Other Ambulatory Visit: Payer: Self-pay

## 2019-12-02 ENCOUNTER — Encounter (HOSPITAL_COMMUNITY): Payer: Self-pay | Admitting: Emergency Medicine

## 2019-12-02 DIAGNOSIS — R220 Localized swelling, mass and lump, head: Secondary | ICD-10-CM | POA: Insufficient documentation

## 2019-12-02 DIAGNOSIS — Z5321 Procedure and treatment not carried out due to patient leaving prior to being seen by health care provider: Secondary | ICD-10-CM | POA: Diagnosis not present

## 2019-12-02 DIAGNOSIS — K0889 Other specified disorders of teeth and supporting structures: Secondary | ICD-10-CM | POA: Insufficient documentation

## 2019-12-02 LAB — BASIC METABOLIC PANEL
Anion gap: 12 (ref 5–15)
BUN: 7 mg/dL (ref 6–20)
CO2: 25 mmol/L (ref 22–32)
Calcium: 9.4 mg/dL (ref 8.9–10.3)
Chloride: 98 mmol/L (ref 98–111)
Creatinine, Ser: 0.89 mg/dL (ref 0.44–1.00)
GFR calc Af Amer: >60 mL/min (ref >60)
GFR calc non Af Amer: >60 mL/min (ref >60)
Glucose, Bld: 116 mg/dL — ABNORMAL HIGH (ref 70–99)
Potassium: 2.8 mmol/L — ABNORMAL LOW (ref 3.5–5.1)
Sodium: 135 mmol/L (ref 135–145)

## 2019-12-02 LAB — CBC
HCT: 37.4 % (ref 36.0–46.0)
Hemoglobin: 12.2 g/dL (ref 12.0–15.0)
MCH: 30.7 pg (ref 26.0–34.0)
MCHC: 32.6 g/dL (ref 30.0–36.0)
MCV: 94.2 fL (ref 80.0–100.0)
Platelets: 349 10*3/uL (ref 150–400)
RBC: 3.97 MIL/uL (ref 3.87–5.11)
RDW: 12.3 % (ref 11.5–15.5)
WBC: 8.3 10*3/uL (ref 4.0–10.5)
nRBC: 0 % (ref 0.0–0.2)

## 2019-12-02 NOTE — ED Triage Notes (Signed)
Pt seen at [facility] yesterday for lip and jaw swelling. Was told to take benadryl but has had no change to swelling. Hx of seizure and cerebral palsy. Endorses mouth pain.

## 2019-12-02 NOTE — Telephone Encounter (Signed)
Patient's aunt calls nurse line to follow up on urgent care visit yesterday. Patient was seen in UC yesterday for swelling in lips, throat and jaws.   Caregiver reports that she was told to give patient benadryl and that this was likely a reaction to tegretol. Patient is not currently having any difficulty breathing or swallowing, and does not appear to be showing any signs of distress.   Encouraged caregiver to continue current regimen from UC and to follow up if symptoms persist or worsen.   Strict ED precautions given  Veronda Prude, RN

## 2019-12-02 NOTE — ED Notes (Signed)
Pt was called several time to be taken to room no answer.

## 2019-12-03 ENCOUNTER — Emergency Department (HOSPITAL_COMMUNITY)
Admission: EM | Admit: 2019-12-03 | Discharge: 2019-12-03 | Disposition: A | Discharge status: Home / Self Care | Payer: Medicare HMO | Attending: Emergency Medicine | Admitting: Emergency Medicine

## 2019-12-03 ENCOUNTER — Emergency Department (HOSPITAL_COMMUNITY): Payer: Medicare HMO

## 2019-12-03 ENCOUNTER — Encounter (HOSPITAL_COMMUNITY): Payer: Self-pay

## 2019-12-03 DIAGNOSIS — Z79899 Other long term (current) drug therapy: Secondary | ICD-10-CM | POA: Insufficient documentation

## 2019-12-03 DIAGNOSIS — G809 Cerebral palsy, unspecified: Secondary | ICD-10-CM | POA: Diagnosis not present

## 2019-12-03 DIAGNOSIS — K029 Dental caries, unspecified: Secondary | ICD-10-CM | POA: Insufficient documentation

## 2019-12-03 DIAGNOSIS — I10 Essential (primary) hypertension: Secondary | ICD-10-CM | POA: Diagnosis not present

## 2019-12-03 DIAGNOSIS — F79 Unspecified intellectual disabilities: Secondary | ICD-10-CM | POA: Insufficient documentation

## 2019-12-03 DIAGNOSIS — E876 Hypokalemia: Secondary | ICD-10-CM | POA: Insufficient documentation

## 2019-12-03 DIAGNOSIS — R479 Unspecified speech disturbances: Secondary | ICD-10-CM | POA: Insufficient documentation

## 2019-12-03 DIAGNOSIS — J45909 Unspecified asthma, uncomplicated: Secondary | ICD-10-CM | POA: Diagnosis not present

## 2019-12-03 DIAGNOSIS — R220 Localized swelling, mass and lump, head: Secondary | ICD-10-CM | POA: Diagnosis not present

## 2019-12-03 LAB — CBC WITH DIFFERENTIAL/PLATELET
Abs Immature Granulocytes: 0.02 10*3/uL (ref 0.00–0.07)
Basophils Absolute: 0 10*3/uL (ref 0.0–0.1)
Basophils Relative: 0 %
Eosinophils Absolute: 0.1 10*3/uL (ref 0.0–0.5)
Eosinophils Relative: 1 %
HCT: 37.4 % (ref 36.0–46.0)
Hemoglobin: 12.3 g/dL (ref 12.0–15.0)
Immature Granulocytes: 0 %
Lymphocytes Relative: 22 %
Lymphs Abs: 1.9 10*3/uL (ref 0.7–4.0)
MCH: 30.7 pg (ref 26.0–34.0)
MCHC: 32.9 g/dL (ref 30.0–36.0)
MCV: 93.3 fL (ref 80.0–100.0)
Monocytes Absolute: 0.4 10*3/uL (ref 0.1–1.0)
Monocytes Relative: 5 %
Neutro Abs: 6.1 10*3/uL (ref 1.7–7.7)
Neutrophils Relative %: 72 %
Platelets: 383 10*3/uL (ref 150–400)
RBC: 4.01 MIL/uL (ref 3.87–5.11)
RDW: 12.7 % (ref 11.5–15.5)
WBC: 8.6 10*3/uL (ref 4.0–10.5)
nRBC: 0 % (ref 0.0–0.2)

## 2019-12-03 LAB — BASIC METABOLIC PANEL
Anion gap: 14 (ref 5–15)
BUN: 7 mg/dL (ref 6–20)
CO2: 25 mmol/L (ref 22–32)
Calcium: 8.8 mg/dL — ABNORMAL LOW (ref 8.9–10.3)
Chloride: 100 mmol/L (ref 98–111)
Creatinine, Ser: 0.78 mg/dL (ref 0.44–1.00)
GFR calc Af Amer: >60 mL/min (ref >60)
GFR calc non Af Amer: >60 mL/min (ref >60)
Glucose, Bld: 127 mg/dL — ABNORMAL HIGH (ref 70–99)
Potassium: 2.9 mmol/L — ABNORMAL LOW (ref 3.5–5.1)
Sodium: 139 mmol/L (ref 135–145)

## 2019-12-03 MED ORDER — POTASSIUM CHLORIDE 20 MEQ PO PACK
40.0000 meq | PACK | Freq: Once | ORAL | Status: AC
  Administered 2019-12-03: 20:00:00 40 meq via ORAL
  Filled 2019-12-03: qty 2

## 2019-12-03 MED ORDER — POTASSIUM CHLORIDE CRYS ER 20 MEQ PO TBCR: 20.0000 meq | EXTENDED_RELEASE_TABLET | Freq: Two times a day (BID) | ORAL | 0 refills | Status: DC

## 2019-12-03 MED ORDER — IOHEXOL 300 MG/ML  SOLN
75.0000 mL | Freq: Once | INTRAMUSCULAR | Status: AC | PRN
  Administered 2019-12-03: 75 mL via INTRAVENOUS

## 2019-12-03 MED ORDER — POTASSIUM CHLORIDE 10 MEQ/100ML IV SOLN
10.0000 meq | Freq: Once | INTRAVENOUS | Status: AC
  Administered 2019-12-03: 10 meq via INTRAVENOUS
  Filled 2019-12-03: qty 100

## 2019-12-03 NOTE — ED Provider Notes (Signed)
Patricia White is a 49 y.o. female, presenting to the ED with report of facial swelling.    HPI from Arthor Captain, PA-C: "Patricia White is a 49 y.o. female.  Who presents to the emergency department brought in by her mother for facial swelling.  Patient has a history of MR and and cerebral palsy.  There is a level 5 caveat due to MR.  History is also limited because the mother, who is giving the history is a very poor historian.  The patient has chronic left-sided hemiparesis.  Mother states that she has noted swelling on the right side of her face and her lip and jaw for the past 2 days.  She did just start on a new antihypertensive medication.  Her mother is not sure what it is.  He also states that she noticed her daughter has been chewing on her tongue which is new and she feels like her speech is abnormal.  It appears she has been seen in the past for slurred speech.  She states that she has not noticed any other abnormalities.  She has been otherwise well without cough, sore throat, fever or chills.  The patient had her second Covid vaccine 2 weeks ago."  Past Medical History:  Diagnosis Date  . Asthma   . Cerebral palsy (HCC) Birth  . Mental retardation Birth  . Seizures (HCC)     Physical Exam  BP (!) 145/105 (BP Location: Right Arm)   Pulse 99   Temp 97.9 F (36.6 C) (Oral)   Resp 20   Ht 5\' 2"  (1.575 m)   Wt 81.6 kg   SpO2 99%   BMI 32.92 kg/m   Physical Exam Vitals and nursing note reviewed.  Constitutional:      General: She is not in acute distress.    Appearance: She is well-developed. She is not diaphoretic.  HENT:     Head: Normocephalic and atraumatic.     Mouth/Throat:     Mouth: Mucous membranes are moist.     Pharynx: Oropharynx is clear.     Comments: Dentition appears to be intact and stable.  No noted area of intraoral swelling or fluctuance.  No trismus or noted abnormal phonation.  Mouth opening to at least 3 finger widths.  Handles oral secretions  without difficulty.  No noted facial swelling.  No sublingual swelling or tongue elevation.  No swelling or tenderness to the submental or submandibular regions.  No swelling or tenderness into the soft tissues of the neck. Eyes:     Conjunctiva/sclera: Conjunctivae normal.  Cardiovascular:     Rate and Rhythm: Normal rate and regular rhythm.     Pulses: Normal pulses.          Radial pulses are 2+ on the right side and 2+ on the left side.       Posterior tibial pulses are 2+ on the right side and 2+ on the left side.     Heart sounds: Normal heart sounds.     Comments: Tactile temperature in the extremities appropriate and equal bilaterally. Pulmonary:     Effort: Pulmonary effort is normal. No respiratory distress.     Breath sounds: Normal breath sounds.  Abdominal:     Palpations: Abdomen is soft.     Tenderness: There is no abdominal tenderness. There is no guarding.  Musculoskeletal:     Cervical back: Neck supple.     Right lower leg: No edema.     Left  lower leg: No edema.  Lymphadenopathy:     Cervical: No cervical adenopathy.  Skin:    General: Skin is warm and dry.  Neurological:     Mental Status: She is alert.  Psychiatric:        Mood and Affect: Mood and affect normal.        Speech: Speech normal.        Behavior: Behavior normal.     ED Course/Procedures    Procedures   Abnormal Labs Reviewed  BASIC METABOLIC PANEL - Abnormal; Notable for the following components:      Result Value   Potassium 2.9 (*)    Glucose, Bld 127 (*)    Calcium 8.8 (*)    All other components within normal limits     CT Head Wo Contrast  Result Date: 12/03/2019 CLINICAL DATA:  Speech difficulty. EXAM: CT HEAD WITHOUT CONTRAST TECHNIQUE: Contiguous axial images were obtained from the base of the skull through the vertex without intravenous contrast. COMPARISON:  Concurrently performed maxillofacial CT, non-contrast head CT 09/14/2019 FINDINGS: Brain: There is no evidence of  acute intracranial hemorrhage, intracranial mass, midline shift or extra-axial fluid collection.No demarcated cortical infarction. Vascular: No hyperdense vessel. Skull: Normal. Negative for fracture or focal lesion. Sinuses/Orbits: Visualized orbits demonstrate no acute abnormality. Mild ethmoid and maxillary sinus mucosal thickening. Small left frontal sinus mucous retention cysts. No significant mastoid effusion. IMPRESSION: No evidence of acute intracranial abnormality. Mild ethmoid and maxillary sinus mucosal thickening. Small left frontal sinus mucous retention cysts. Electronically Signed   By: Jackey Loge DO   On: 12/03/2019 18:55   CT Maxillofacial W Contrast  Result Date: 12/03/2019 CLINICAL DATA:  Mass, lump or swelling, max face. EXAM: CT MAXILLOFACIAL WITH CONTRAST TECHNIQUE: Multidetector CT imaging of the maxillofacial structures was performed with intravenous contrast. Multiplanar CT image reconstructions were also generated. CONTRAST:  79mL OMNIPAQUE IOHEXOL 300 MG/ML  SOLN COMPARISON:  Noncontrast head CT 09/14/2019 FINDINGS: Osseous: No maxillofacial fracture. Within the body of the mandible on the left, there is a well-circumscribed 15 mm slightly expansile lesion. The lesion has a sclerotic border with narrow zone of transition. The lesion surrounds the roots of the mandibular left lateral incisor and left canine. Orbits: No acute abnormality. The globes are normal in size and contour. The extraocular muscles are symmetric and unremarkable. Sinuses: Mild ethmoid and maxillary sinus mucosal thickening. Small mucous retention cysts within the inferior left frontal sinus. No significant mastoid effusion at the imaged levels. Soft tissues: No appreciable inflammatory changes within the maxillofacial or visualized upper neck soft tissues. The nasopharyngeal and oropharyngeal airways are patent. The epiglottis is unremarkable. Limited intracranial: Better assessed on concurrently performed  non-contrast head CT. Other: Poor dentition with multiple carious teeth and periapical lucency surrounding multiple teeth. IMPRESSION: 1. No appreciable swelling or inflammatory changes within maxillofacial or visualized upper neck soft tissues. 2. 15 mm slightly expansile lesion within the body of the mandible on the left, surrounding the roots of the mandibular left lateral incisor and left canine, as described. This finding is indeterminate but favored benign. 3. Poor dentition with multiple carious teeth and periapical lucencies. 4. Mild ethmoid and maxillary sinus mucosal thickening. 5. Small left frontal sinus mucous retention cysts. Electronically Signed   By: Jackey Loge DO   On: 12/03/2019 18:49    MDM      Patient care handoff received from Arthor Captain, PA-C. Plan: Review CTs and labs.  Patient presents with reported facial swelling.  I did not note any swelling on my exam. Patient is nontoxic appearing, afebrile, not tachycardic on my exam, not tachypneic, not hypotensive, maintains excellent SPO2 on room air, and is in no apparent distress.   I have reviewed the patient's chart to obtain more information.  I reviewed and interpreted the patient's labs and radiological studies. CT with possible left mandibular lesion.  This was discussed with the patient's mother.  We will have PCP follow-up on this matter. Hypokalemia noted and discussed with the patient's mother.  She opted for continued management outpatient and retesting to her PCP. Multiple dental caries noted.  Recommended dental follow-up.  Referral information given.    Vitals:   12/03/19 1653 12/03/19 1700 12/03/19 1730 12/03/19 2000  BP: (!) 140/101 (!) 122/93 111/83 128/78  Pulse: 99 91 85 95  Resp: 20 14 16  (!) 21  Temp:    98 F (36.7 C)  TempSrc:      SpO2: 99% 100% 99% 100%  Weight:      Height:          Lorayne Bender, PA-C 12/04/19 0038    Blanchie Dessert, MD 12/05/19 1515

## 2019-12-03 NOTE — ED Notes (Signed)
Patient transported to CT 

## 2019-12-03 NOTE — ED Provider Notes (Signed)
[facility] EMERGENCY DEPARTMENT Provider Note   CSN: [id number] Arrival date & time: 12/03/19  1033     History Chief Complaint  Patient presents with  . Facial Swelling    Patricia White is a 49 y.o. female.  Who presents to the emergency department brought in by her mother for facial swelling.  Patient has a history of MR and and cerebral palsy.  There is a level 5 caveat due to MR.  History is also limited because the mother, who is giving the history is a very poor historian.  The patient has chronic left-sided hemiparesis.  Mother states that she has noted swelling on the right side of her face and her lip and jaw for the past 2 days.  She did just start on a new antihypertensive medication.  Her mother is not sure what it is.  He also states that she noticed her daughter has been chewing on her tongue which is new and she feels like her speech is abnormal.  It appears she has been seen in the past for slurred speech.  She states that she has not noticed any other abnormalities.  She has been otherwise well without cough, sore throat, fever or chills.  The patient had her second Covid vaccine 2 weeks ago.  HPI     Past Medical History:  Diagnosis Date  . Asthma   . Cerebral palsy (HCC) Birth  . Mental retardation Birth  . Seizures [facility])     Patient Active Problem List   Diagnosis Date Noted  . Seasonal allergies 11/20/2019  . Essential hypertension 10/23/2019  . Seizure disorder (HCC) 02/23/2016  . 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  . 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 Obesity (BMI 30.0-34.9) 10/13/2009  . Infantile cerebral palsy (HCC) 10/11/2006    History reviewed. No pertinent surgical history.   OB History   No obstetric history on file.     Family History  Problem Relation Age of Onset  . Migraines Mother   . Cancer Mother        liver cancer  . Diabetes Maternal Aunt   . Hypertension Maternal Grandmother   .  Colon cancer Maternal Grandfather 60  . Prostate cancer Maternal Grandfather   . Throat cancer Maternal Grandfather     Social History   Tobacco Use  . Smoking status: Never Smoker  . Smokeless tobacco: Never Used  Substance Use Topics  . Alcohol use: No  . Drug use: No    Home Medications Prior to Admission medications   Medication Sig Start Date End Date Taking? Authorizing Provider  carbamazepine (TEGRETOL) 200 MG tablet TAKE 1&1/2 TABLETS BY MOUTH TWICE A DAY Patient taking differently: Take 300 mg by mouth 2 (two) times daily.  09/10/19  Yes Lomax, Amy, NP  DEPAKOTE ER 250 MG 24 hr tablet TAKE 2 TABLETS BY MOUTH TWICE A DAY Patient taking differently: Take 500 mg by mouth 2 (two) times daily.  09/10/19  Yes Lomax, Amy, NP  indapamide (LOZOL) 1.25 MG tablet Take 1 tablet (1.25 mg total) by mouth daily. 10/23/19  Yes Mullis, Kiersten P, DO  TOPAMAX 25 MG tablet TAKE 3 TABLETS BY MOUTH TWICE A DAY Patient taking differently: Take 75 mg by mouth 2 (two) times daily.  09/10/19  Yes Lomax, Amy, NP    Allergies    Patient has no known allergies.  Review of Systems   Review of Systems Unable to review systems secondary to MR Physical  Exam Updated Vital Signs BP (!) 145/105 (BP Location: Right Arm)   Pulse 99   Temp 97.9 F (36.6 C) (Oral)   Resp 20   Ht 5\' 2"  (1.575 m)   Wt 81.6 kg   SpO2 99%   BMI 32.92 kg/m   Physical Exam Vitals and nursing note reviewed.  Constitutional:      General: She is not in acute distress.    Appearance: She is well-developed. She is not diaphoretic.  HENT:     Head: Normocephalic and atraumatic.     Mouth/Throat:     Comments: No obvious lip or tongue swelling.  Uvula is midline with normal phonation.  There does appear to be some swelling over the right parotid gland.  No trismus. Eyes:     General: No scleral icterus.    Conjunctiva/sclera: Conjunctivae normal.  Cardiovascular:     Rate and Rhythm: Normal rate and regular rhythm.      Heart sounds: Normal heart sounds. No murmur. No friction rub. No gallop.   Pulmonary:     Effort: Pulmonary effort is normal. No respiratory distress.     Breath sounds: Normal breath sounds.  Abdominal:     General: Bowel sounds are normal. There is no distension.     Palpations: Abdomen is soft. There is no mass.     Tenderness: There is no abdominal tenderness. There is no guarding.  Musculoskeletal:     Cervical back: Normal range of motion.  Skin:    General: Skin is warm and dry.  Neurological:     Mental Status: She is alert and oriented to person, place, and time.     Comments: Chronic left-sided spastic hemiparesis There is some tongue deviation of unclear chronology.  Psychiatric:        Behavior: Behavior normal.     ED Results / Procedures / Treatments   Labs (all labs ordered are listed, but only abnormal results are displayed) Labs Reviewed  CBC WITH DIFFERENTIAL/PLATELET  BASIC METABOLIC PANEL    EKG None  Radiology No results found.  Procedures Procedures (including critical care time)  Medications Ordered in ED Medications - No data to display  ED Course  I have reviewed the triage vital signs and the nursing notes.  Pertinent labs & imaging results that were available during my care of the patient were reviewed by me and considered in my medical decision making (see chart for details).    MDM Rules/Calculators/A&P                      This is a 49 year old female with a history of MR and cerebral palsy who is brought in by her mother for facial swelling.  Do not see any obvious swelling of the mouth tongue or lips however she does appear to have some swelling over the right parotid region.  I do not see any obvious sialolith.  I am unsure if the patient's tongue deviation is new.  I have ordered a CT maxillofacial with contrast and a CT of the head.  I have given signout to PA 49 who will assume care of the patient.  Patient CBC has returned  and shows no acute abnormalities.  Initial pulse was listed at 126 bpm's.  I think that this was anomalous given the fact that the remainder of her pulses have been within normal limits. Final Clinical Impression(s) / ED Diagnoses Final diagnoses:  None    Rx / DC  Orders ED Discharge Orders    None       Margarita Mail, PA-C 12/03/19 [place], Gwenyth Allegra, MD 12/03/19 [phone]

## 2019-12-03 NOTE — ED Notes (Signed)
Pt ambulated to bathroom with stand by assist.  

## 2019-12-03 NOTE — ED Triage Notes (Signed)
Pt having lip and jaw swelling, pt came here yesterday and had labs done but LWBS due to wait times, airway intact

## 2019-12-03 NOTE — Discharge Instructions (Addendum)
The potassium was lower than normal today.  Please take the potassium supplement daily.  Have this value rechecked by primary care provider next week.  There were some dental cavities noted in the teeth.  These should be evaluated by dentist.  Call to make an appointment.

## 2019-12-03 NOTE — Congregational Nurse Program (Signed)
Tc from grandmother, Patricia White was at urgent care Monday and er yesterday.   Labs done last night but not seen.  Grandmother left about 1 am without being seen, she is elderly and unable to stay longer, she plans to return today to er.  Juliann Pulse,  Theola Sequin,       Congregational nurse [phone]

## 2019-12-04 NOTE — Congregational Nurse Program (Signed)
TC to grandmother, they went to ER yesterday and waited a long time to be seen.  K+ is low and needs dental work.  Grandmother has a dentist to take her to, will followup to ensure she gets treatment.  Lenisha has medicare and medicaid.    Juliann Pulse, RN Congregational nurse [phone]

## 2019-12-10 ENCOUNTER — Other Ambulatory Visit: Payer: Self-pay

## 2019-12-10 ENCOUNTER — Encounter: Payer: Self-pay | Admitting: Family Medicine

## 2019-12-10 ENCOUNTER — Ambulatory Visit (INDEPENDENT_AMBULATORY_CARE_PROVIDER_SITE_OTHER): Payer: Medicare HMO | Admitting: Family Medicine

## 2019-12-10 VITALS — BP 142/82 | HR 106 | Ht 62.0 in | Wt 170.8 lb

## 2019-12-10 DIAGNOSIS — T783XXD Angioneurotic edema, subsequent encounter: Secondary | ICD-10-CM

## 2019-12-10 DIAGNOSIS — M279 Disease of jaws, unspecified: Secondary | ICD-10-CM | POA: Insufficient documentation

## 2019-12-10 DIAGNOSIS — T783XXA Angioneurotic edema, initial encounter: Secondary | ICD-10-CM | POA: Insufficient documentation

## 2019-12-10 NOTE — Assessment & Plan Note (Addendum)
Patient with 15 mm mandibular lesion on the left.  Unclear etiology but appears to be benign per radiology read.  Shared decision making with patient at on whether to do repeat imaging versus referral.  Requesting referral at this time.  Will request to oral maxillofacial surgery.  Also have given social work resources for dentist in the area.  Aunt will call to schedule this appointment.  Strict return precautions given.

## 2019-12-10 NOTE — Assessment & Plan Note (Signed)
Patient with new angioedema.  Unclear etiology but seems to have resolved with twice daily Benadryl use.  Advised to continue this.  Also advised to follow-up with allergist.  Plan going further will be dependent on this.  Can consider medication related.  Not on an ACE inhibitor but could be caused by some of her other antiseizure medications.  We will wait for allergy testing before discontinuing.  Strict return precautions given.  Follow-up in 1 month.

## 2019-12-10 NOTE — Patient Instructions (Addendum)
1. Please make sure to see a dentist 2. I have referred you to an allergy specialist 3. I have referred you to the oral surgeon regarding the lesion on CT 4. Follow up in 1 month 5. You can use zyrtec instead of benadryl if she is too drowsy with benadryl   Dr. Darin Engels

## 2019-12-10 NOTE — Progress Notes (Signed)
    SUBJECTIVE:   CHIEF COMPLAINT / HPI:   ED f/u Patient seen in the emergency department on 4/21 for mandibular swelling.  Already of history obtained from patient's aunt.  Aunt reports that patient wakes up 1-2 times a week with swelling.  Sometimes it is her jaw, sometimes it is her eyes, sometimes it is her lip.  Since going to the emergency department she has been getting Benadryl twice a day which has resolved the edema.  And thinks it secondary to allergy since it is now gone.  Symptoms have been persistent for over a month now.  At the ED it was her right jaw.  In the ED her speech was limited but now her speech is at baseline.  Denies any other systemic symptoms like fevers.  Has not seen a dentist yet as they were not sure which dentists are available in the area.  Of note a 15 mm lesion was noted in the left mandible.  PERTINENT  PMH / PSH: HTN, seizure disorder, CP, obesity  OBJECTIVE:   BP (!) 142/82   Pulse (!) 106   Ht 5\' 2"  (1.575 m)   Wt 170 lb 12.8 oz (77.5 kg)   SpO2 99%   BMI 31.24 kg/m   Gen: awake and alert, NAD HEENT: non tender to palpation of mandible and TMJ, no edema, moist mucous membranes, missing teeth Cardio: RRR, no MRG Resp: CTAB, no wheezes, rales or rhonchi GI: soft, non tender, non distended, bowel sounds present Ext: no edema  ASSESSMENT/PLAN:   Angioedema Patient with new angioedema.  Unclear etiology but seems to have resolved with twice daily Benadryl use.  Advised to continue this.  Also advised to follow-up with allergist.  Plan going further will be dependent on this.  Can consider medication related.  Not on an ACE inhibitor but could be caused by some of her other antiseizure medications.  We will wait for allergy testing before discontinuing.  Strict return precautions given.  Follow-up in 1 month.  Lesion of mandible Patient with 15 mm mandibular lesion on the left.  Unclear etiology but appears to be benign per radiology read.  Shared  decision making with patient at on whether to do repeat imaging versus referral.  Requesting referral at this time.  Will request to oral maxillofacial surgery.  Also have given social work resources for dentist in the area.  Aunt will call to schedule this appointment.  Strict return precautions given.   Discussed patient with Dr. , DO [facility]

## 2019-12-22 ENCOUNTER — Emergency Department (HOSPITAL_COMMUNITY)
Admission: EM | Admit: 2019-12-22 | Discharge: 2019-12-22 | Disposition: A | Discharge status: Home / Self Care | Payer: Medicare HMO | Attending: Emergency Medicine | Admitting: Emergency Medicine

## 2019-12-22 ENCOUNTER — Encounter (HOSPITAL_COMMUNITY): Payer: Self-pay | Admitting: Emergency Medicine

## 2019-12-22 ENCOUNTER — Other Ambulatory Visit: Payer: Self-pay

## 2019-12-22 DIAGNOSIS — J45909 Unspecified asthma, uncomplicated: Secondary | ICD-10-CM | POA: Diagnosis not present

## 2019-12-22 DIAGNOSIS — I10 Essential (primary) hypertension: Secondary | ICD-10-CM | POA: Insufficient documentation

## 2019-12-22 DIAGNOSIS — N898 Other specified noninflammatory disorders of vagina: Secondary | ICD-10-CM | POA: Insufficient documentation

## 2019-12-22 DIAGNOSIS — Z79899 Other long term (current) drug therapy: Secondary | ICD-10-CM | POA: Diagnosis not present

## 2019-12-22 DIAGNOSIS — J029 Acute pharyngitis, unspecified: Secondary | ICD-10-CM | POA: Insufficient documentation

## 2019-12-22 DIAGNOSIS — N7689 Other specified inflammation of vagina and vulva: Secondary | ICD-10-CM | POA: Diagnosis not present

## 2019-12-22 DIAGNOSIS — N3090 Cystitis, unspecified without hematuria: Secondary | ICD-10-CM | POA: Insufficient documentation

## 2019-12-22 LAB — URINALYSIS, ROUTINE W REFLEX MICROSCOPIC
Bilirubin Urine: NEGATIVE
Glucose, UA: NEGATIVE mg/dL
Ketones, ur: 5 mg/dL — AB
Nitrite: NEGATIVE
Protein, ur: NEGATIVE mg/dL
Specific Gravity, Urine: 1.026 (ref 1.005–1.030)
pH: 5 (ref 5.0–8.0)

## 2019-12-22 LAB — GROUP A STREP BY PCR: Group A Strep by PCR: NOT DETECTED

## 2019-12-22 LAB — PREGNANCY, URINE: Preg Test, Ur: NEGATIVE

## 2019-12-22 MED ORDER — CEPHALEXIN 500 MG PO CAPS: 500.0000 mg | ORAL_CAPSULE | Freq: Two times a day (BID) | ORAL | 0 refills | Status: DC
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
## 2019-12-22 NOTE — ED Triage Notes (Signed)
Pt arrives to ED with c/o of sore throat since Saturday- per mom pts left eye was swollen on Saturday as well but that has resolved on its on.

## 2019-12-22 NOTE — ED Provider Notes (Signed)
20:30: Assumed care of patient from Providence Lanius, PA-C at change of shift pending pregnancy test and urinalysis.  Please see prior provider note for full H&P.   Briefly patient is a 49 year old female with a history of CP and MR who presents to the emergency department with complaints of sore throat as well as some discomfort in the genital area.  She is present with her aunt who provides primary history.  Physical Exam  BP (!) 127/98 (BP Location: Right Arm)   Pulse 97   Temp 97.8 F (36.6 C) (Oral)   Resp 16   SpO2 100%   Physical Exam Vitals and nursing note reviewed.  Constitutional:      General: She is not in acute distress.    Appearance: She is well-developed.  HENT:     Head: Normocephalic and atraumatic.  Eyes:     General:        Right eye: No discharge.        Left eye: No discharge.     Conjunctiva/sclera: Conjunctivae normal.  Neurological:     Mental Status: She is alert.     Comments: Clear speech.   Psychiatric:        Behavior: Behavior normal.        Thought Content: Thought content normal.     ED Course/Procedures     Results for orders placed or performed during the hospital encounter of 12/22/19  Group A Strep by PCR   Specimen: Throat; Sterile Swab  Result Value Ref Range   Group A Strep by PCR NOT DETECTED NOT DETECTED  Urinalysis, Routine w reflex microscopic  Result Value Ref Range   Color, Urine YELLOW YELLOW   APPearance HAZY (A) CLEAR   Specific Gravity, Urine 1.026 1.005 - 1.030   pH 5.0 5.0 - 8.0   Glucose, UA NEGATIVE NEGATIVE mg/dL   Hgb urine dipstick MODERATE (A) NEGATIVE   Bilirubin Urine NEGATIVE NEGATIVE   Ketones, ur 5 (A) NEGATIVE mg/dL   Protein, ur NEGATIVE NEGATIVE mg/dL   Nitrite NEGATIVE NEGATIVE   Leukocytes,Ua LARGE (A) NEGATIVE   RBC / HPF 0-5 0 - 5 RBC/hpf   WBC, UA 21-50 0 - 5 WBC/hpf   Bacteria, UA FEW (A) NONE SEEN   Squamous Epithelial / LPF 11-20 0 - 5   Mucus PRESENT   Pregnancy, urine  Result Value  Ref Range   Preg Test, Ur NEGATIVE NEGATIVE   CT Head Wo Contrast  Result Date: 12/03/2019 CLINICAL DATA:  Speech difficulty. EXAM: CT HEAD WITHOUT CONTRAST TECHNIQUE: Contiguous axial images were obtained from the base of the skull through the vertex without intravenous contrast. COMPARISON:  Concurrently performed maxillofacial CT, non-contrast head CT 09/14/2019 FINDINGS: Brain: There is no evidence of acute intracranial hemorrhage, intracranial mass, midline shift or extra-axial fluid collection.No demarcated cortical infarction. Vascular: No hyperdense vessel. Skull: Normal. Negative for fracture or focal lesion. Sinuses/Orbits: Visualized orbits demonstrate no acute abnormality. Mild ethmoid and maxillary sinus mucosal thickening. Small left frontal sinus mucous retention cysts. No significant mastoid effusion. IMPRESSION: No evidence of acute intracranial abnormality. Mild ethmoid and maxillary sinus mucosal thickening. Small left frontal sinus mucous retention cysts. Electronically Signed   By: Kellie Simmering DO   On: 12/03/2019 18:55   CT Maxillofacial W Contrast  Result Date: 12/03/2019 CLINICAL DATA:  Mass, lump or swelling, max face. EXAM: CT MAXILLOFACIAL WITH CONTRAST TECHNIQUE: Multidetector CT imaging of the maxillofacial structures was performed with intravenous contrast. Multiplanar CT image reconstructions were  also generated. CONTRAST:  39mL OMNIPAQUE IOHEXOL 300 MG/ML  SOLN COMPARISON:  Noncontrast head CT 09/14/2019 FINDINGS: Osseous: No maxillofacial fracture. Within the body of the mandible on the left, there is a well-circumscribed 15 mm slightly expansile lesion. The lesion has a sclerotic border with narrow zone of transition. The lesion surrounds the roots of the mandibular left lateral incisor and left canine. Orbits: No acute abnormality. The globes are normal in size and contour. The extraocular muscles are symmetric and unremarkable. Sinuses: Mild ethmoid and maxillary sinus  mucosal thickening. Small mucous retention cysts within the inferior left frontal sinus. No significant mastoid effusion at the imaged levels. Soft tissues: No appreciable inflammatory changes within the maxillofacial or visualized upper neck soft tissues. The nasopharyngeal and oropharyngeal airways are patent. The epiglottis is unremarkable. Limited intracranial: Better assessed on concurrently performed non-contrast head CT. Other: Poor dentition with multiple carious teeth and periapical lucency surrounding multiple teeth. IMPRESSION: 1. No appreciable swelling or inflammatory changes within maxillofacial or visualized upper neck soft tissues. 2. 15 mm slightly expansile lesion within the body of the mandible on the left, surrounding the roots of the mandibular left lateral incisor and left canine, as described. This finding is indeterminate but favored benign. 3. Poor dentition with multiple carious teeth and periapical lucencies. 4. Mild ethmoid and maxillary sinus mucosal thickening. 5. Small left frontal sinus mucous retention cysts. Electronically Signed   By: Jackey Loge DO   On: 12/03/2019 18:49     Procedures  MDM   Plan at change of shift is to follow-up on pregnancy test and urinalysis, if concerning for UTI will treat.  Pregnancy test is negative. Urinalysis contaminated but does have some features concerning for UTI.  Will culture and treat with Keflex.  Vaginal skin irritation was noted on prior provider GU exam - re-educated patient & her aunt regarding utilizing clean, dry, cotton loose fitting under garments to assist with this. I discussed results, treatment plan, need for follow-up, and return precautions with the patient & her aunt at bedside. Provided opportunity for questions, patient's aunt confirmed understanding and is in agreement with plan.        Desmond Lope 12/22/19 2257    Charlynne Pander, MD 12/23/19 [phone]

## 2019-12-22 NOTE — Discharge Instructions (Addendum)
You were seen in the emergency department this evening for a sore throat as well as some irritation to the private area.  Your strep test was negative.  Your urine sample shows findings somewhat suspicious for infection.  We are starting you on Keflex, an antibiotic, to treat the infection.  We have sent this sample for culture to ensure this is the appropriate antibiotic.  Please take all of your antibiotics until finished. You may develop abdominal discomfort or diarrhea from the antibiotic.  You may help offset this with probiotics which you can buy at the store (ask your pharmacist if unable to find) or get probiotics in the form of eating yogurt. Do not eat or take the probiotics until 2 hours after your antibiotic. If you are unable to tolerate these side effects follow-up with your primary care provider or return to the emergency department.   If you begin to experience any blistering, rashes, swelling, or difficulty breathing seek medical care for evaluation of potentially more serious side effects.   Please be aware that this medication may interact with other medications you are taking, please be sure to discuss your medication list with your pharmacist.   Please be sure to utilize clean, dry, cotton loosefitting undergarments to avoid vaginal irritation.  Please follow-up with your primary care provider within 1 week for reevaluation.  Return to the ER for new or worsening symptoms or any other concerns.

## 2019-12-22 NOTE — ED Notes (Signed)
Pt and family aware urine is needed. Pt given a cup of water

## 2019-12-24 LAB — URINE CULTURE

## 2020-01-06 ENCOUNTER — Telehealth: Payer: Self-pay

## 2020-01-06 NOTE — Telephone Encounter (Signed)
PA for depakote ER 250 completed via covermymeds. Key: [id number].  Request approved: "PA Case: [id number], Status: Approved, Coverage Starts on: 08/15/2019 12:00:00 AM, Coverage Ends on: 08/13/2020 12:00:00 AM. Questions? Contact [phone]."  I called [employer] regarding the topamax PA. It was partially approved for only a 30 day supply #180 tablets. They report that [name] was able to run a paid claim. PA valid from 08/15/2019-08/13/2020. PA # [id number]. [name] aware.

## 2020-01-15 ENCOUNTER — Telehealth: Payer: Self-pay | Admitting: Family Medicine

## 2020-01-15 NOTE — Telephone Encounter (Signed)
Latoya from [facility] called wanting to know if the PA request for the pt's carbamazepine (TEGRETOL) 200 MG tablet was received. Please advise.

## 2020-01-15 NOTE — Telephone Encounter (Signed)
Completed TEGRETOL 200 mg BRAND PA on Cover My Meds. Key: [id number].   PA Case: [id number], Status: Approved, Coverage Starts on: 08/15/2019 12:00:00 AM, Coverage Ends on: 08/13/2020 12:00:00 AM. Questions? Contact [phone].  I called [name] and notified Latoya of the Tegretol approval through 08/13/20. She verbalized appreciation.

## 2020-01-19 ENCOUNTER — Emergency Department (HOSPITAL_COMMUNITY): Payer: Medicare HMO

## 2020-01-19 ENCOUNTER — Encounter (HOSPITAL_COMMUNITY): Payer: Self-pay | Admitting: Emergency Medicine

## 2020-01-19 ENCOUNTER — Other Ambulatory Visit: Payer: Self-pay

## 2020-01-19 ENCOUNTER — Emergency Department (HOSPITAL_COMMUNITY)
Admission: EM | Admit: 2020-01-19 | Discharge: 2020-01-20 | Disposition: A | Discharge status: Home / Self Care | Payer: Medicare HMO | Attending: Emergency Medicine | Admitting: Emergency Medicine

## 2020-01-19 DIAGNOSIS — I10 Essential (primary) hypertension: Secondary | ICD-10-CM | POA: Insufficient documentation

## 2020-01-19 DIAGNOSIS — G809 Cerebral palsy, unspecified: Secondary | ICD-10-CM | POA: Diagnosis not present

## 2020-01-19 DIAGNOSIS — Z79899 Other long term (current) drug therapy: Secondary | ICD-10-CM | POA: Diagnosis not present

## 2020-01-19 DIAGNOSIS — R220 Localized swelling, mass and lump, head: Secondary | ICD-10-CM | POA: Diagnosis not present

## 2020-01-19 DIAGNOSIS — E876 Hypokalemia: Secondary | ICD-10-CM | POA: Insufficient documentation

## 2020-01-19 DIAGNOSIS — K029 Dental caries, unspecified: Secondary | ICD-10-CM | POA: Insufficient documentation

## 2020-01-19 DIAGNOSIS — F71 Moderate intellectual disabilities: Secondary | ICD-10-CM | POA: Diagnosis not present

## 2020-01-19 DIAGNOSIS — J45909 Unspecified asthma, uncomplicated: Secondary | ICD-10-CM | POA: Diagnosis not present

## 2020-01-19 LAB — COMPREHENSIVE METABOLIC PANEL
ALT: 10 U/L (ref 0–44)
AST: 14 U/L — ABNORMAL LOW (ref 15–41)
Albumin: 3.2 g/dL — ABNORMAL LOW (ref 3.5–5.0)
Alkaline Phosphatase: 78 U/L (ref 38–126)
Anion gap: 16 — ABNORMAL HIGH (ref 5–15)
BUN: 10 mg/dL (ref 6–20)
CO2: 24 mmol/L (ref 22–32)
Calcium: 8.6 mg/dL — ABNORMAL LOW (ref 8.9–10.3)
Chloride: 92 mmol/L — ABNORMAL LOW (ref 98–111)
Creatinine, Ser: 0.87 mg/dL (ref 0.44–1.00)
GFR calc Af Amer: >60 mL/min (ref >60)
GFR calc non Af Amer: >60 mL/min (ref >60)
Glucose, Bld: 133 mg/dL — ABNORMAL HIGH (ref 70–99)
Potassium: 2.6 mmol/L — CL (ref 3.5–5.1)
Sodium: 132 mmol/L — ABNORMAL LOW (ref 135–145)
Total Bilirubin: 0.7 mg/dL (ref 0.3–1.2)
Total Protein: 9 g/dL — ABNORMAL HIGH (ref 6.5–8.1)

## 2020-01-19 LAB — CBC WITH DIFFERENTIAL/PLATELET
Abs Immature Granulocytes: 0.01 10*3/uL (ref 0.00–0.07)
Basophils Absolute: 0 10*3/uL (ref 0.0–0.1)
Basophils Relative: 0 %
Eosinophils Absolute: 0 10*3/uL (ref 0.0–0.5)
Eosinophils Relative: 1 %
HCT: 37.3 % (ref 36.0–46.0)
Hemoglobin: 12.1 g/dL (ref 12.0–15.0)
Immature Granulocytes: 0 %
Lymphocytes Relative: 28 %
Lymphs Abs: 2.4 10*3/uL (ref 0.7–4.0)
MCH: 30.6 pg (ref 26.0–34.0)
MCHC: 32.4 g/dL (ref 30.0–36.0)
MCV: 94.4 fL (ref 80.0–100.0)
Monocytes Absolute: 0.6 10*3/uL (ref 0.1–1.0)
Monocytes Relative: 7 %
Neutro Abs: 5.5 10*3/uL (ref 1.7–7.7)
Neutrophils Relative %: 64 %
Platelets: 281 10*3/uL (ref 150–400)
RBC: 3.95 MIL/uL (ref 3.87–5.11)
RDW: 13.1 % (ref 11.5–15.5)
WBC: 8.6 10*3/uL (ref 4.0–10.5)
nRBC: 0 % (ref 0.0–0.2)

## 2020-01-19 LAB — MAGNESIUM: Magnesium: 2.3 mg/dL (ref 1.7–2.4)

## 2020-01-19 LAB — I-STAT CREATININE, ED: Creatinine, Ser: 0.8 mg/dL (ref 0.44–1.00)

## 2020-01-19 MED ORDER — POTASSIUM CHLORIDE 10 MEQ/100ML IV SOLN
10.0000 meq | INTRAVENOUS | Status: AC
  Administered 2020-01-19 – 2020-01-20 (×2): 10 meq via INTRAVENOUS
  Filled 2020-01-19: qty 100

## 2020-01-19 MED ORDER — POTASSIUM CHLORIDE CRYS ER 20 MEQ PO TBCR
60.0000 meq | EXTENDED_RELEASE_TABLET | Freq: Once | ORAL | Status: AC
  Administered 2020-01-19: 60 meq via ORAL
  Filled 2020-01-19: qty 3

## 2020-01-19 MED ORDER — ONDANSETRON HCL 4 MG/2ML IJ SOLN
4.0000 mg | Freq: Once | INTRAMUSCULAR | Status: AC
  Administered 2020-01-19: 4 mg via INTRAVENOUS
  Filled 2020-01-19: qty 2

## 2020-01-19 MED ORDER — IOHEXOL 300 MG/ML  SOLN
75.0000 mL | Freq: Once | INTRAMUSCULAR | Status: AC | PRN
  Administered 2020-01-19: 75 mL via INTRAVENOUS

## 2020-01-19 MED ORDER — FENTANYL CITRATE (PF) 100 MCG/2ML IJ SOLN
50.0000 ug | Freq: Once | INTRAMUSCULAR | Status: AC
  Administered 2020-01-19: 50 ug via INTRAVENOUS
  Filled 2020-01-19: qty 2

## 2020-01-19 NOTE — ED Provider Notes (Signed)
[facility] EMERGENCY DEPARTMENT Provider Note   CSN: [id number] Arrival date & time: 01/19/20  1432     History Chief Complaint  Patient presents with  . Facial Swelling    Patricia White is a 49 y.o. female.  With a past medical history of angioedema, cerebral palsy, MR, obesity, congenital left hemiparesis who presents for lip and facial swelling.  Yesterday the patient began complaining of pain on the right side of her face.  She had a little bit of swelling in her right upper cheek.  This morning she awoke with swelling in the upper lip, right cheek up to the eye, and over the right mandible.  Patient does seem to be in pain.  She has no difficulty breathing, no wheezing, no tongue swelling.  HPI     Past Medical History:  Diagnosis Date  . Asthma   . Cerebral palsy ([place]) Birth  . Mental retardation Birth  . Seizures [facility])     Patient Active Problem List   Diagnosis Date Noted  . Angioedema 12/10/2019  . Lesion of mandible 12/10/2019  . Seasonal allergies 11/20/2019  . Essential hypertension 10/23/2019  . Seizure disorder ([place]) 02/23/2016  . Congenital left hemiparesis ([place]) 02/23/2015  . 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 Obesity (BMI 30.0-34.9) 10/13/2009  . Infantile cerebral palsy ([place]) 10/11/2006    History reviewed. No pertinent surgical history.   OB History   No obstetric history on file.     Family History  Problem Relation Age of Onset  . Migraines Mother   . Cancer Mother        liver cancer  . Diabetes Maternal Aunt   . Hypertension Maternal Grandmother   . Colon cancer Maternal Grandfather 59  . Prostate cancer Maternal Grandfather   . Throat cancer Maternal Grandfather     Social History   Tobacco Use  . Smoking status: Never Smoker  . Smokeless tobacco: Never Used  Substance Use Topics  . Alcohol use: No  . Drug use: No    Home Medications Prior to Admission medications   Medication Sig Start  Date End Date Taking? Authorizing Provider  carbamazepine (TEGRETOL) 200 MG tablet TAKE 1&1/2 TABLETS BY MOUTH TWICE A DAY Patient taking differently: Take 300 mg by mouth 2 (two) times daily.  09/10/19   Lomax, Amy, NP  cephALEXin (KEFLEX) 500 MG capsule Take 1 capsule (500 mg total) by mouth 2 (two) times daily. 12/22/19   Petrucelli, Samantha R, PA-C  DEPAKOTE ER 250 MG 24 hr tablet TAKE 2 TABLETS BY MOUTH TWICE A DAY Patient taking differently: Take 500 mg by mouth 2 (two) times daily.  09/10/19   Lomax, Amy, NP  indapamide (LOZOL) 1.25 MG tablet Take 1 tablet (1.25 mg total) by mouth daily. 10/23/19   Mullis, Kiersten P, DO  potassium chloride SA (KLOR-CON) 20 MEQ tablet Take 1 tablet (20 mEq total) by mouth 2 (two) times daily for 5 days. 12/03/19 12/08/19  Joy, Shawn C, PA-C  TOPAMAX 25 MG tablet TAKE 3 TABLETS BY MOUTH TWICE A DAY Patient taking differently: Take 75 mg by mouth 2 (two) times daily.  09/10/19   Debbora Presto, NP    Allergies    Patient has no known allergies.  Review of Systems   Review of Systems Ten systems reviewed and are negative for acute change, except as noted in the HPI. \  Physical Exam Updated Vital Signs BP 139/90   Pulse 94   Temp  98.2 F (36.8 C)   Resp 16   Ht 5\' 2"  (1.575 m)   Wt 81.6 kg   SpO2 96%   BMI 32.92 kg/m   Physical Exam Vitals and nursing note reviewed.  Constitutional:      General: She is not in acute distress.    Appearance: She is well-developed. She is not diaphoretic.  HENT:     Head: Normocephalic and atraumatic.     Comments: Poor dentition, tender, erythematous gums without fluctuance. Swelling of the right upper lip, right mandible, right cheek up to the inferior orbital region. Bilateral hypertrophied parotid glands. Eyes:     General: No scleral icterus.    Conjunctiva/sclera: Conjunctivae normal.  Cardiovascular:     Rate and Rhythm: Normal rate and regular rhythm.     Heart sounds: Normal heart sounds. No murmur. No  friction rub. No gallop.   Pulmonary:     Effort: Pulmonary effort is normal. No respiratory distress.     Breath sounds: Normal breath sounds.  Abdominal:     General: Bowel sounds are normal. There is no distension.     Palpations: Abdomen is soft. There is no mass.     Tenderness: There is no abdominal tenderness. There is no guarding.  Musculoskeletal:     Cervical back: Normal range of motion.  Skin:    General: Skin is warm and dry.  Neurological:     Mental Status: She is alert and oriented to person, place, and time.  Psychiatric:        Behavior: Behavior normal.     ED Results / Procedures / Treatments   Labs (all labs ordered are listed, but only abnormal results are displayed) Labs Reviewed - No data to display  EKG None  Radiology No results found.  Procedures Procedures (including critical care time)  Medications Ordered in ED Medications - No data to display  ED Course  I have reviewed the triage vital signs and the nursing notes.  Pertinent labs & imaging results that were available during my care of the patient were reviewed by me and considered in my medical decision making (see chart for details).    MDM Rules/Calculators/A&P                     9:33 PM BP 139/90   Pulse 94   Temp 98.2 F (36.8 C)   Resp 16   Ht 5\' 2"  (1.575 m)   Wt 81.6 kg   SpO2 96%   BMI 32.92 kg/m  Patient with history of angioedema.  The aunt is unfamiliar with this diagnosis states that she did not know that she had this.  Her clinical appearance seems more consistent with infection rather than angioedema.  I have ordered a CT maxillofacial, pain medications, fluids, basic labs.  Patient awaiting CT.  Reviewed patient's labs which shows a low potassium level.  Seems to have trended down over her last 3 values.  Have ordered a magnesium level which is pending.  Patient given oral repletion with 79 M EQ of potassium and 2 runs of IV potassium 10 milliequivalents per  hour. Expect the patient likely has odontogenic source of her infection and can be discharged with oral clindamycin.  Patient will also need 3 days of oral potassium as well.  I have given signout to PA upstill who will assume care of the patient. Final Clinical Impression(s) / ED Diagnoses Final diagnoses:  None    Rx / DC  Orders ED Discharge Orders    None       Arthor Captain, PA-C 01/19/20 2335    Jacalyn Lefevre, MD 01/20/20 [phone]

## 2020-01-19 NOTE — ED Provider Notes (Signed)
Cerebral palsy, MR Here with aunt for facial swelling, right sided Poor dentition Getting fluids, potassium supplement Pending mag level No fever, she is eating and drinking per usual  Review mag level, CT max  Magnesium level normal.   On recheck, the potassium has been completed. CT shows facial swelling without abscess or fluid collection. There are multiple dental caries which suggest odontogenic source.   Will discharge home on abx, potassium supplementation per plan of previous treatment team.     Elpidio Anis, PA-C 01/20/20 0234    Jacalyn Lefevre, MD 01/20/20 [phone]

## 2020-01-19 NOTE — ED Triage Notes (Signed)
Aunt brought patient to ED for evaluation because patient woke up this morning with facial swelling. Per aunt patient has CP and Mentally handicapped. Patient does not appear having shortness of breath and aunt stated she has not shown any signs of shortness of breath since this morning. Airway intact bilateral equal chest rise and fall.

## 2020-01-20 DIAGNOSIS — R220 Localized swelling, mass and lump, head: Secondary | ICD-10-CM | POA: Diagnosis not present

## 2020-01-20 MED ORDER — POTASSIUM CHLORIDE CRYS ER 20 MEQ PO TBCR
40.0000 meq | EXTENDED_RELEASE_TABLET | Freq: Every day | ORAL | 0 refills | Status: DC
Start: 2020-01-20 — End: 2020-03-22

## 2020-01-20 MED ORDER — CLINDAMYCIN HCL 300 MG PO CAPS
300.0000 mg | ORAL_CAPSULE | Freq: Three times a day (TID) | ORAL | 0 refills | Status: DC
Start: 2020-01-20 — End: 2020-01-22

## 2020-01-20 NOTE — Discharge Instructions (Addendum)
Take the antibiotic as prescribed for likely dental infection causing facial swelling. Follow up with your dentist for further management of needed dental care.

## 2020-01-22 ENCOUNTER — Other Ambulatory Visit: Payer: Self-pay

## 2020-01-22 ENCOUNTER — Encounter: Payer: Self-pay | Admitting: Allergy

## 2020-01-22 ENCOUNTER — Ambulatory Visit (INDEPENDENT_AMBULATORY_CARE_PROVIDER_SITE_OTHER): Payer: Medicare HMO | Admitting: Allergy

## 2020-01-22 VITALS — BP 124/84 | HR 96 | Resp 18 | Ht 62.5 in | Wt 172.0 lb

## 2020-01-22 DIAGNOSIS — T783XXD Angioneurotic edema, subsequent encounter: Secondary | ICD-10-CM | POA: Diagnosis not present

## 2020-01-22 MED ORDER — EPINEPHRINE 0.3 MG/0.3ML IJ SOAJ: 0.3000 mg | INTRAMUSCULAR | 3 refills | Status: DC | PRN

## 2020-01-22 NOTE — Progress Notes (Signed)
New Patient Note  RE: [name] MRN: [id number] DOB: 10/18/1970 Date of Office Visit: 01/22/2020  Referring provider: Westley Chandler, MD Primary care provider: Joana Reamer, DO  Chief Complaint: facial swelling  History of present illness: Patricia White is a 49 y.o. female presenting today for consultation for angioedema.  She presents today with her aunt who provides the history as pt has cerebral palsy, seizure disorder and MR.    Her aunt states she was referred to allergy because Patricia White will wake up with swelling of her lower face and lip.  States it can occur 2-3 times a week.  Last episode was this past Sunday and aunt reports it has gone down.  It takes about 2-3 days to improve fully but then the swelling returns.  Ongoing for about 2-3 months. No issues like this before.  Typically occurs overnight but recent episode started in the evening.  Aunt has not been able to identify any triggering events.  She states she eats the same foods.  She does love hamburgers. No changes in medications.  No environmental changes.   No complaints of headaches, fevers or episodic abdominal cramping.  No hives or itching.   Aunt states she has tried using orajel on the gums.   Will also use tylenol as needed.  Aunt has been giving benadryl during these episodes and reports helps some but did not this recent episode.   No family history of swelling that aunt is aware of.  Her mother is deceased.     Aunt says about a month or so ago she did go to UC for swelling and states was told her tongue and back of throat was swollen.  States she was recommended to take benadryl at that time.   She denies she has had any difficulty breathing with these episodes and no GI or CV related symptoms either.    She was seen in the ED for angioedema on 01/19/2020.  She was found to have electrolyte abnormalities.  She was given fluids and potassium supplement.  She had CT max without contrast that showed an  indurated area without abscess or fluid collection in the jaw mandible.  Also shows multiple dental caries.  She was discharged to take clindamycin which she continues at this time as well as potassium supplement.     Aunt is working on Patent examiner for Transport planner.  Aunt reports she has seen the dentist and states it was recommended she have all her teeth removed.  The oral surgeon that was recommended does not take her insurance.  So she is still in the process of identifying an oral surgeon in her network.  Aunt states she was never told she had asthma (this is listed in her past medical history).   Aunt to her knowledge states she has never had any inhaler medications.  She denies any asthma symptoms.  Review of systems: Review of Systems  Constitutional: Negative.   HENT: Negative.   Eyes: Negative.   Respiratory: Negative.   Cardiovascular: Negative.   Gastrointestinal: Negative.   Skin: Negative.     All other systems negative unless noted above in HPI  Past medical history: Past Medical History:  Diagnosis Date  . Asthma   . Cerebral palsy (HCC) Birth  . Mental retardation Birth  . Seizures (HCC)     Past surgical history: History reviewed. No pertinent surgical history.  Family history:  Family History  Problem Relation Age of  Onset  . Migraines Mother   . Cancer Mother        liver cancer  . Diabetes Maternal Aunt   . Hypertension Maternal Grandmother   . Colon cancer Maternal Grandfather 6  . Prostate cancer Maternal Grandfather   . Throat cancer Maternal Grandfather     Social history: She lives in a home without carpeting with gas and electric heating and central cooling.  There is a dog in the home.  There is no concern for water damage, mildew or roaches in the home.  She is disabled.  There is no smoking history.                                                                                                 Medication List: Current Outpatient  Medications  Medication Sig Dispense Refill  . carbamazepine (TEGRETOL) 200 MG tablet TAKE 1&1/2 TABLETS BY MOUTH TWICE A DAY (Patient taking differently: Take 300 mg by mouth 2 (two) times daily. ) 270 tablet 1  . DEPAKOTE ER 250 MG 24 hr tablet TAKE 2 TABLETS BY MOUTH TWICE A DAY (Patient taking differently: Take 500 mg by mouth 2 (two) times daily. ) 360 tablet 1  . indapamide (LOZOL) 1.25 MG tablet Take 1 tablet (1.25 mg total) by mouth daily. 90 tablet 3  . potassium chloride SA (KLOR-CON) 20 MEQ tablet Take 2 tablets (40 mEq total) by mouth daily. 6 tablet 0  . TOPAMAX 25 MG tablet TAKE 3 TABLETS BY MOUTH TWICE A DAY (Patient taking differently: Take 75 mg by mouth 2 (two) times daily. ) 540 tablet 1  . EPINEPHrine 0.3 mg/0.3 mL IJ SOAJ injection Inject 0.3 mLs (0.3 mg total) into the muscle as needed for anaphylaxis. 2 each 3   No current facility-administered medications for this visit.    Known medication allergies: No Known Allergies   Physical examination: Blood pressure 124/84, pulse 96, resp. rate 18, height 5' 2.5" (1.588 m), weight 172 lb (78 kg), SpO2 98 %.  General: Alert, interactive, in no acute distress. HEENT: PERRLA, poor dentition, TMs pearly gray, turbinates non-edematous without discharge, post-pharynx non erythematous.  No significant lip or facial edema Neck: Supple without lymphadenopathy. Lungs: Clear to auscultation without wheezing, rhonchi or rales. {no increased work of breathing. CV: Normal S1, S2 without murmurs. Abdomen: Nondistended, nontender. Skin: Warm and dry, without lesions or rashes. Extremities:  No clubbing, cyanosis or edema. Neuro:   Grossly intact.  Diagnositics/Labs: Labs/Imaging: CT max w contrast 01/19/20-   IMPRESSION: 1. Nonspecific mild induration of the subcutaneous tissue of the lower right face. No abscess or fluid collection. 2. Multiple dental caries and mild bilateral maxillary sinus mucosal thickening, possibly  odontogenic.  CT max w contrast 12/03/19- IMPRESSION:  1. No appreciable swelling or inflammatory changes within maxillofacial or visualized upper neck soft tissues.  2. 15 mm slightly expansile lesion within the body of the mandible on the left, surrounding the roots of the mandibular left lateral incisor and left canine, as described. This finding is indeterminate but favored benign.  3. Poor dentition with multiple carious teeth  and periapical lucencies.  4. Mild ethmoid and maxillary sinus mucosal thickening.  5. Small left frontal sinus mucous retention cysts.  CT Head wo contrast 12/03/19-  IMPRESSION: No evidence of acute intracranial abnormality. Mild ethmoid and maxillary sinus mucosal thickening. Small left frontal sinus mucous retention cysts.  Component     Latest Ref Rng & Units 01/19/2020  WBC     4.0 - 10.5 K/uL 8.6  RBC     3.87 - 5.11 MIL/uL 3.95  Hemoglobin     12.0 - 15.0 g/dL 29.9  HCT     36 - 46 % 37.3  MCV     80.0 - 100.0 fL 94.4  MCH     26.0 - 34.0 pg 30.6  MCHC     30.0 - 36.0 g/dL 37.1  RDW     69.6 - 78.9 % 13.1  Platelets     150 - 400 K/uL 281  nRBC     0.0 - 0.2 % 0.0  Neutrophils     % 64  NEUT#     1.7 - 7.7 K/uL 5.5  Lymphocytes     % 28  Lymphocyte #     0.7 - 4.0 K/uL 2.4  Monocytes Relative     % 7  Monocyte #     0 - 1 K/uL 0.6  Eosinophil     % 1  Eosinophils Absolute     0 - 0 K/uL 0.0  Basophil     % 0  Basophils Absolute     0 - 0 K/uL 0.0  Immature Granulocytes     % 0  Abs Immature Granulocytes     0.00 - 0.07 K/uL 0.01  Sodium     135 - 145 mmol/L 132 (L)  Potassium     3.5 - 5.1 mmol/L 2.6 (LL)  Chloride     98 - 111 mmol/L 92 (L)  CO2     22 - 32 mmol/L 24  Glucose     70 - 99 mg/dL 381 (H)  BUN     6 - 20 mg/dL 10  Creatinine     0.17 - 1.00 mg/dL 5.10  Calcium     8.9 - 10.3 mg/dL 8.6 (L)  Total Protein     6.5 - 8.1 g/dL 9.0 (H)  Albumin     3.5 - 5.0 g/dL 3.2 (L)  AST     15 - 41 U/L 14  (L)  ALT     0 - 44 U/L 10  Alkaline Phosphatase     38 - 126 U/L 78  Total Bilirubin     0.3 - 1.2 mg/dL 0.7  GFR, Est Non African American     >60 mL/min >60  GFR, Est African American     >60 mL/min >60  Anion gap     5 - 15 16 (H)    Assessment and plan:   Facial swelling (angioedema)  - at this time etiology of swelling is unknown however there is concern it could be related to dental hygiene.  Agree with having evaluation by oral surgeon.    Swelling can be caused by a variety of different triggers including illness/infection, foods, medications, stings, exercise, pressure, vibrations, extremes of temperature to name a few however majority of the time there is no identifiable trigger.  Swelling without hives can also be non-allergic in nature and due to an entity called Hereditary Angioedema (HAE).   With HAE facial swelling is  very common and the swelling can last several days before resolving but usually does not respond to medication use to treat allergies or allergic reactions.    - we will be obtaining labwork as follows to evaluate your facial swelling:  tryptase, environmental panel, alpha-gal panel, inflammatory marker and HAE panel.  - would recommend taking Zyrtec 10mg  daily to see if this will help decrease swelling episodes  - will set up you with an Epipen due to noted swelling of the tongue and airway.  Follow the emergency action for symptoms if occurring would warrant use of Epipen  - if HAE panel is positive then will get you set up with the treatment for this at that time.     - you have had labs done by the ED with showing normal CBC (blood cell counts) and her CMP (electrolyes, liver and kidney function labs) did show abnormality of her electrolytes.  Continue potassium supplements as directed and follow-up with your PCP.    - imaging studies reviewed  Follow-up in 2-3 months or sooner if needed  I appreciate the opportunity to take part in Burma's care.  Please do not hesitate to contact me with questions.  Sincerely,   , MD Allergy/Immunology [facility]

## 2020-01-22 NOTE — Patient Instructions (Addendum)
Facial swelling (angioedema)  - at this time etiology of swelling is unknown however there is concern it could be related to dental hygiene.  Agree with having evaluation by oral surgeon.    Swelling can be caused by a variety of different triggers including illness/infection, foods, medications, stings, exercise, pressure, vibrations, extremes of temperature to name a few however majority of the time there is no identifiable trigger.  Swelling without hives can also be non-allergic in nature and due to an entity called Hereditary Angioedema (HAE).   With HAE facial swelling is very common and the swelling can last several days before resolving but usually does not respond to medication use to treat allergies or allergic reactions.    - we will be obtaining labwork as follows to evaluate your facial swelling:  tryptase, environmental panel, alpha-gal panel, inflammatory marker and HAE panel.  - would recommend taking Zyrtec 10mg  daily to see if this will help decrease swelling episodes  - will set up you with an Epipen due to noted swelling of the tongue and airway.  Follow the emergency action for symptoms if occurring would warrant use of Epipen  - if HAE panel is positive then will get you set up with the treatment for this at that time.     - you have had labs done by the ED with showing normal CBC (blood cell counts) and her CMP (electrolyes, liver and kidney function labs) did show abnormality of her electrolytes.  Continue potassium supplements as directed and follow-up with your PCP.    - imaging studies reviewed  Follow-up in 2-3 months or sooner if needed

## 2020-01-29 LAB — ALLERGENS W/TOTAL IGE AREA 2
Alternaria Alternata IgE: 1.05 kU/L — AB
Aspergillus Fumigatus IgE: 1.54 kU/L — AB
Bermuda Grass IgE: 4.49 kU/L — AB
Cat Dander IgE: 0.45 kU/L — AB
Cedar, Mountain IgE: <0.1 kU/L
Cladosporium Herbarum IgE: <0.1 kU/L
Cockroach, German IgE: <0.1 kU/L
Common Silver Birch IgE: <0.1 kU/L
Cottonwood IgE: <0.1 kU/L
D Farinae IgE: <0.1 kU/L
D Pteronyssinus IgE: <0.1 kU/L
Dog Dander IgE: 1.79 kU/L — AB
Elm, American IgE: <0.1 kU/L
IgE (Immunoglobulin E), Serum: 861 IU/mL — ABNORMAL HIGH (ref 6–495)
Johnson Grass IgE: 8.19 kU/L — AB
Maple/Box Elder IgE: <0.1 kU/L
Mouse Urine IgE: <0.1 kU/L
Oak, White IgE: <0.1 kU/L
Pecan, Hickory IgE: 20.8 kU/L — AB
Penicillium Chrysogen IgE: 1.4 kU/L — AB
Pigweed, Rough IgE: <0.1 kU/L
Ragweed, Short IgE: 0.12 kU/L — AB
Sheep Sorrel IgE Qn: 0.13 kU/L — AB
Timothy Grass IgE: 37.7 kU/L — AB
White Mulberry IgE: <0.1 kU/L

## 2020-01-29 LAB — ALPHA-GAL PANEL
Alpha Gal IgE*: <0.1 kU/L (ref <0.10)
Beef (Bos spp) IgE: 0.18 kU/L (ref <0.35)
Class Interpretation: 0
Lamb/Mutton (Ovis spp) IgE: 0.12 kU/L (ref <0.35)
Pork (Sus spp) IgE: <0.1 kU/L (ref <0.35)

## 2020-01-29 LAB — HEREDITARY ANGIOEDEMA
C2 Esterase Inhibitor, Serum: 32 mg/dL (ref 21–39)
Complement C4, Serum: 57 mg/dL — ABNORMAL HIGH (ref 12–38)

## 2020-01-29 LAB — SEDIMENTATION RATE: Sed Rate: 85 mm/hr — ABNORMAL HIGH (ref 0–32)

## 2020-01-29 LAB — HAE INTERPRETATION:

## 2020-01-29 LAB — TRYPTASE: Tryptase: 7.5 ug/L (ref 2.2–13.2)

## 2020-01-29 LAB — C1 ESTERASE INHIBITOR, FUNC: C1 Est.Inhib.Funct.: 90 %mean normal

## 2020-02-09 ENCOUNTER — Telehealth: Payer: Self-pay

## 2020-02-09 NOTE — Telephone Encounter (Signed)
Patients Aunt called as she has received the letter stating we have been trying to contact them.  Please called either [phone] or [phone]  Thanks

## 2020-02-09 NOTE — Telephone Encounter (Signed)
Called and spoke with Lillia Mountain, patients aunt who is listed on the [facility]. I went over lab results. She voiced understanding and will relay message to the patient. Mailed avoidance measures to patients address on file.

## 2020-03-10 ENCOUNTER — Ambulatory Visit: Payer: Medicare Other | Admitting: Family Medicine

## 2020-03-18 ENCOUNTER — Ambulatory Visit: Payer: Medicare HMO | Admitting: Adult Health

## 2020-03-18 NOTE — Progress Notes (Deleted)
PATIENT: Patricia White DOB: 1970-11-04  REASON FOR VISIT: follow up HISTORY FROM: patient  No chief complaint on file.    HISTORY OF PRESENT ILLNESS:  Today, 03/18/2020, Patricia White returns for seizure follow-up.  ED evaluation on 09/14/2019 for suspected seizure with complaints of increased right-sided weakness and decreased ability to speak with abrasion noted on the side of her tongue.  Stroke/TIA ruled out.  No medication changes.  Symptoms rapidly resolved and work-up unremarkable and discharged home.  She has not had any reoccurring seizure type symptoms or events.  Remains on Depakote ER 500 mg twice daily, Tegretol 300 mg twice daily and Topamax 75 mg twice daily.  Tolerating well without side effects.       History provided her husband services only Update 09/09/2019 AL: Patricia White is a 49 y.o. female here today for follow up for seizures. She presents today with her grandmother who aids in history. Her grandmother reports that overall she is doing very well. She continues Depakote ER 500 mg twice daily, Tegretol 300 mg twice daily and Topamax 75 mg twice daily. She is tolerating medications well with no obvious adverse effects. Her grandmother denies any recent seizure activity. Last seizure in 07/2018. She does continue to note intermittent jerking of her upper and lower extremities. Jerking typically last a couple of seconds. Patricia White is able to communicate during these events. There is no tonic-clonic movement. No tongue injuries or incontinence. Jerking has been persistent for years. 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 Her grandmother also notes that over the last couple days, she has noted very mild swelling of patient's right cheek and left upper lip. There has been no difficulty eating or swallowing. No respiratory difficulty. No other rash or swelling.  Patricia White is able to dress herself. She does require assistance with bathing.  She does not cook or drive. She lives with her grandmother who is primary caregiver as her mother passed away about 8 years ago. She has not been seen by PCP recently. She does not usually have elevated blood pressures per grandmother. She is doing well today and without concerns.   Update 02/05/2019 JM: Patricia White is a 49 year old female who is being seen today for follow-up for seizure management with underlying history of seizure disorder, moderate MR, congenital left hemiparesis abnormality of gait and history of cerebral palsy.  Initially scheduled for in office face-to-face visit but due to COVID-19 safety precautions, visit transition to telemedicine with patient's and grandmothers consent.    Grandmother reports "jerking and twitching spells" recently over the past 2 weeks and is questioning whether she needs dosage increase of her medications.  Per review of prior office visits, reports of myoclonus has been discussed previously.  Occurs approximately 1-2 times weekly. denies seizure activity with loss of consciousness.  Denies any recent changes to medications, diet, activity level or increased stressors.  Grandmother also endorses increased fatigue level and recent weight gain.  Recent weight 158lb and previous weight 6 months prior 176lb.  She reports ongoing compliance with valproate, carbamazepine and topiramate and denies any side effects.  Blood work obtained at prior visit which were all satisfactory.  No further concerns at this time.  08/05/2018 visit: Patient is being seen today due to seizure at home on 07/08/2018.  She is accompanied by her grandmother.  She did have a second seizure with EMS while being transported to ED.  Per ED notes, she continue to take  valproic acid, carbamazepine and topiramate but had run out of her carbamazepine 3 days prior.  It was felt as though her seizure was likely running out of her carbamazepine as labs confirmed undetectable level.  She has not had any  seizure activity in the past 4 years.  Valproic acid level 45. Since discharge home, denies any recent seizure activity. She continues on valproic acid, carbamazepine and topiramate without side effects. 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 She only wears this brace when she is out of the house.  Denies any recent falls.  Grandmother is concerned as her last menses was in October without menses in November or December. She is typically regular.  No further concerns at this time.   REVIEW OF SYSTEMS: Out of a complete 14 system review of symptoms, the patient complains only of the following symptoms, seizure, swelling of right cheek and lip and all other reviewed systems are negative.  ALLERGIES: No Known Allergies  HOME MEDICATIONS: Outpatient Medications Prior to Visit  Medication Sig Dispense Refill   carbamazepine (TEGRETOL) 200 MG tablet TAKE 1&1/2 TABLETS BY MOUTH TWICE A DAY (Patient taking differently: Take 300 mg by mouth 2 (two) times daily. ) 270 tablet 1   DEPAKOTE ER 250 MG 24 hr tablet TAKE 2 TABLETS BY MOUTH TWICE A DAY (Patient taking differently: Take 500 mg by mouth 2 (two) times daily. ) 360 tablet 1   EPINEPHrine 0.3 mg/0.3 mL IJ SOAJ injection Inject 0.3 mLs (0.3 mg total) into the muscle as needed for anaphylaxis. 2 each 3   indapamide (LOZOL) 1.25 MG tablet Take 1 tablet (1.25 mg total) by mouth daily. 90 tablet 3   potassium chloride SA (KLOR-CON) 20 MEQ tablet Take 2 tablets (40 mEq total) by mouth daily. 6 tablet 0   TOPAMAX 25 MG tablet TAKE 3 TABLETS BY MOUTH TWICE A DAY (Patient taking differently: Take 75 mg by mouth 2 (two) times daily. ) 540 tablet 1   No facility-administered medications prior to visit.    PAST MEDICAL HISTORY: Past Medical History:  Diagnosis Date   Asthma    Cerebral palsy (HCC) Birth   Mental retardation Birth   Seizures (HCC)     PAST SURGICAL HISTORY: No past surgical  history on file.  FAMILY HISTORY: Family History  Problem Relation Age of Onset   Migraines Mother    Cancer Mother        liver cancer   Diabetes Maternal Aunt    Hypertension Maternal Grandmother    Colon cancer Maternal Grandfather 60   Prostate cancer Maternal Grandfather    Throat cancer Maternal Grandfather     SOCIAL HISTORY: Social History   Socioeconomic History   Marital status: Single    Spouse name: Not on file   Number of children: 0   Years of education: dis.school   Highest education level: Not on file  Occupational History    Comment: disabled0  Tobacco Use   Smoking status: Never Smoker   Smokeless tobacco: Never Used  Vaping Use   Vaping Use: Never used  Substance and Sexual Activity   Alcohol use: No   Drug use: No   Sexual activity: Never  Other Topics Concern   Not on file  Social History Narrative   Patient is right handed      Current Social History 04/13/2017           Patient lives with grandmother([name] Mila Palmer), maternal aunt (  Lillia Mountain), female cousin and maternal uncle in one level home 04/13/2017   Transportation: Patient relies on aunt for transportation 04/13/2017   Important Relationships Family 04/13/2017    Pets: [place]um, "Harmony" 04/13/2017   Education / Work:  12 th grade/ Disabled 04/13/2017   Interests / Fun: Going to [employer], out for pizza, scary movies 04/13/2017   Religious / Personal Beliefs: Attends Non-Denominational Church 04/13/2017   L. Ducatte, RN, BSN                                                                                                 Social Determinants of Health   Financial Resource Strain:    Difficulty of Paying Living Expenses:   Food Insecurity:    Worried About Programme researcher, broadcasting/film/video in the Last Year:    Barista in the Last Year:   Transportation Needs:    Freight forwarder (Medical):    Lack of Transportation (Non-Medical):   Physical Activity:     Days of Exercise per Week:    Minutes of Exercise per Session:   Stress:    Feeling of Stress :   Social Connections:    Frequency of Communication with Friends and Family:    Frequency of Social Gatherings with Friends and Family:    Attends Religious Services:    Active Member of Clubs or Organizations:    Attends Engineer, structural:    Marital Status:   Intimate Partner Violence:    Fear of Current or Ex-Partner:    Emotionally Abused:    Physically Abused:    Sexually Abused:       PHYSICAL EXAM  There were no vitals filed for this visit. There is no height or weight on file to calculate BMI.  Generalized: Well developed, in no acute distress  Cardiology: normal rate and rhythm, no murmur noted Respiratory: Clear to auscultation of anterior lobes bilaterally, no stridor or wheezing noted Neurological examination  Mentation: Alert. Follows all commands speech and language fluent Cranial nerve II-XII: Pupils were equal round reactive to light. Extraocular movements were full, visual field were full on confrontational test. Facial sensation and strength were normal. Uvula tongue midline. Head turning and shoulder shrug  were normal and symmetric. Motor: The motor testing reveals 5 over 5 strength of right upper and lower extremity. Left upper extremity with significant spasticity and contraction of left hand, left lower extremity 4/5.  Good symmetric motor tone is noted throughout.  Sensory: Sensory testing is intact to soft touch on all 4 extremities. No evidence of extinction is noted.  Coordination: Cerebellar testing reveals somewhat ataxic finger-nose-finger and heel-to-shin bilaterally.  Gait and station: spastic, left hemiplegic gait, stable without assistive device.  Reflexes: Deep tendon reflexes are symmetric and normal bilaterally.   DIAGNOSTIC DATA (LABS, IMAGING, TESTING) - I reviewed patient records, labs, notes, testing and imaging myself  where available.  No flowsheet data found.   Lab Results  Component Value Date   WBC 8.6 01/19/2020   HGB 12.1 01/19/2020   HCT 37.3 01/19/2020   MCV 94.4  01/19/2020   PLT 281 01/19/2020      Component Value Date/Time   NA 132 (L) 01/19/2020 2148   NA 138 09/09/2019 1210   K 2.6 (LL) 01/19/2020 2148   CL 92 (L) 01/19/2020 2148   CO2 24 01/19/2020 2148   GLUCOSE 133 (H) 01/19/2020 2148   BUN 10 01/19/2020 2148   BUN 5 (L) 09/09/2019 1210   CREATININE 0.80 01/19/2020 2203   CALCIUM 8.6 (L) 01/19/2020 2148   PROT 9.0 (H) 01/19/2020 2148   PROT 7.8 09/09/2019 1210   ALBUMIN 3.2 (L) 01/19/2020 2148   ALBUMIN 4.1 09/09/2019 1210   AST 14 (L) 01/19/2020 2148   ALT 10 01/19/2020 2148   ALKPHOS 78 01/19/2020 2148   BILITOT 0.7 01/19/2020 2148   BILITOT <0.2 09/09/2019 1210   GFRNONAA >60 01/19/2020 2148   GFRAA >60 01/19/2020 2148   Lab Results  Component Value Date   CHOL 173 10/06/2019   HDL 53 10/06/2019   LDLCALC 95 10/06/2019   TRIG 143 10/06/2019   CHOLHDL 3.3 10/06/2019   No results found for: HGBA1C No results found for: VITAMINB12 No results found for: TSH     ASSESSMENT AND PLAN 49 y.o. year old female  has a past medical history of Asthma, Cerebral palsy (HCC) (Birth), Mental retardation (Birth), and Seizures (HCC). here with   Seizures -Continue Depakote, Tegretol and Topamax for seizure prophylaxis -Prior valproic acid level 74 -Prior carbamazepine level 10.7    Follow-up in 6 months or call earlier if needed   No orders of the defined types were placed in this encounter.    No orders of the defined types were placed in this encounter.     I spent *** minutes of face-to-face and non-face-to-face time with patient.  This included previsit chart review, lab review, study review, order entry, electronic health record documentation, patient education  Ihor AustinJessica McCue, [facility] LtdGNP-BC  [facility] [address] [place], [place] [id number]  Phone [phone] Fax [phone] Note: This document was prepared with digital dictation and possible smart phrase technology. Any transcriptional errors that result from this process are unintentional.

## 2020-03-22 ENCOUNTER — Ambulatory Visit (INDEPENDENT_AMBULATORY_CARE_PROVIDER_SITE_OTHER): Payer: Medicare HMO | Admitting: Adult Health

## 2020-03-22 ENCOUNTER — Other Ambulatory Visit: Payer: Self-pay

## 2020-03-22 ENCOUNTER — Encounter: Payer: Self-pay | Admitting: Adult Health

## 2020-03-22 VITALS — BP 122/88 | HR 110 | Ht 62.5 in | Wt 173.2 lb

## 2020-03-22 DIAGNOSIS — G40909 Epilepsy, unspecified, not intractable, without status epilepticus: Secondary | ICD-10-CM | POA: Diagnosis not present

## 2020-03-22 MED ORDER — DEPAKOTE ER 250 MG PO TB24: 500.0000 mg | ORAL_TABLET | Freq: Two times a day (BID) | ORAL | 1 refills | Status: DC

## 2020-03-22 MED ORDER — CARBAMAZEPINE 200 MG PO TABS: 300.0000 mg | ORAL_TABLET | Freq: Two times a day (BID) | ORAL | 1 refills | Status: DC

## 2020-03-22 MED ORDER — TOPAMAX 25 MG PO TABS: 75.0000 mg | ORAL_TABLET | Freq: Two times a day (BID) | ORAL | 2 refills | Status: DC

## 2020-03-22 NOTE — Patient Instructions (Signed)
Your Plan:  Continue Depakote ER 500 mg twice daily, Tegretol 300 mg twice daily and Topamax 75 twice daily for seizure prophylaxis  Please call office with any reoccurring seizure activity or events   Follow-up in 6 months or call earlier if needed     Thank you for coming to see [place] at [facility]. I hope we have been able to provide you high quality care today.  You may receive a patient satisfaction survey over the next few weeks. We would appreciate your feedback and comments so that we may continue to improve ourselves and the health of our patients.

## 2020-03-22 NOTE — Progress Notes (Signed)
Patricia White: Patricia White DOB: Mar 03, 1971  REASON FOR VISIT: follow up HISTORY FROM: Patricia White  Chief Complaint  Patricia White presents with  . Follow-up    6 month f/u. States everything has been working well since last visit. Denies any new seizures.   . room 9    with aunt      HISTORY OF PRESENT ILLNESS:  Today, 03/22/2020, Patricia White returns for seizure follow-up accompanied by her aunt.  ED evaluation on 09/14/2019 for suspected seizure with complaints of increased right-sided weakness and decreased ability to speak with abrasion noted on the side of her tongue.  Stroke/TIA ruled out.  No medication changes recommended.  Symptoms rapidly resolved and work-up unremarkable and discharged home.  She has not had any reoccurring seizure type symptoms or events.  Remains on Depakote ER 500 mg twice daily, Tegretol 300 mg twice daily and Topamax 75 mg twice daily.  Tolerating well without side effects.  Prior lab work for therapeutic drug level monitoring 6 months ago satisfactory.  No concerns at this time.      History provided her husband services only Update 09/09/2019 AL: Patricia White is a 49 y.o. female here today for follow up for seizures. She presents today with her grandmother who aids in history. Her grandmother reports that overall she is doing very well. She continues Depakote ER 500 mg twice daily, Tegretol 300 mg twice daily and Topamax 75 mg twice daily. She is tolerating medications well with no obvious adverse effects. Her grandmother denies any recent seizure activity. Last seizure in 07/2018. She does continue to note intermittent jerking of her upper and lower extremities. Jerking typically last a couple of seconds. Lavayah is able to communicate during these events. There is no tonic-clonic movement. No tongue injuries or incontinence. Jerking has been persistent for years. 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 Her grandmother also  notes that over the last couple days, she has noted very mild swelling of Patricia White's right cheek and left upper lip. There has been no difficulty eating or swallowing. No respiratory difficulty. No other rash or swelling.  Jackquline is able to dress herself. She does require assistance with bathing. She does not cook or drive. She lives with her grandmother who is primary caregiver as her mother passed away about 8 years ago. She has not been seen by PCP recently. She does not usually have elevated blood pressures per grandmother. She is doing well today and without concerns.   Virtual visit update 02/05/2019 JM: Patricia White is a 49 year old female who is being seen today for follow-up for seizure management with underlying history of seizure disorder, moderate MR, congenital left hemiparesis abnormality of gait and history of cerebral palsy.  Initially scheduled for in office face-to-face visit but due to COVID-19 safety precautions, visit transition to telemedicine with Patricia White's and grandmothers consent.    Grandmother reports "jerking and twitching spells" recently over the past 2 weeks and is questioning whether she needs dosage increase of her medications.  Per review of prior office visits, reports of myoclonus has been discussed previously.  Occurs approximately 1-2 times weekly. denies seizure activity with loss of consciousness.  Denies any recent changes to medications, diet, activity level or increased stressors.  Grandmother also endorses increased fatigue level and recent weight gain.  Recent weight 158lb and previous weight 6 months prior 176lb.  She reports ongoing compliance with valproate, carbamazepine and topiramate and denies any side effects.  Blood  work obtained at prior visit which were all satisfactory.  No further concerns at this time.  08/05/2018 visit JM: Patricia White is being seen today due to seizure at home on 07/08/2018.  She is accompanied by her grandmother.  She did have a second  seizure with EMS while being transported to ED.  Per ED notes, she continue to take valproic acid, carbamazepine and topiramate but had run out of her carbamazepine 3 days prior.  It was felt as though her seizure was likely running out of her carbamazepine as labs confirmed undetectable level.  She has not had any seizure activity in the past 4 years.  Valproic acid level 45. Since discharge home, denies any recent seizure activity. She continues on valproic acid, carbamazepine and topiramate without side effects. 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 She only wears this brace when she is out of the house.  Denies any recent falls.  Grandmother is concerned as her last menses was in October without menses in November or December. She is typically regular.  No further concerns at this time.   REVIEW OF SYSTEMS: Out of a complete 14 system review of symptoms, the Patricia White complains only of the following symptoms as described in HPI and all other reviewed systems are negative.  ALLERGIES: No Known Allergies  HOME MEDICATIONS: Outpatient Medications Prior to Visit  Medication Sig Dispense Refill  . EPINEPHrine 0.3 mg/0.3 mL IJ SOAJ injection Inject 0.3 mLs (0.3 mg total) into the muscle as needed for anaphylaxis. 2 each 3  . indapamide (LOZOL) 1.25 MG tablet Take 1 tablet (1.25 mg total) by mouth daily. 90 tablet 3  . carbamazepine (TEGRETOL) 200 MG tablet TAKE 1&1/2 TABLETS BY MOUTH TWICE A DAY (Patricia White taking differently: Take 300 mg by mouth 2 (two) times daily. ) 270 tablet 1  . DEPAKOTE ER 250 MG 24 hr tablet TAKE 2 TABLETS BY MOUTH TWICE A DAY (Patricia White taking differently: Take 500 mg by mouth 2 (two) times daily. ) 360 tablet 1  . TOPAMAX 25 MG tablet TAKE 3 TABLETS BY MOUTH TWICE A DAY (Patricia White taking differently: Take 75 mg by mouth 2 (two) times daily. ) 540 tablet 1  . potassium chloride SA (KLOR-CON) 20 MEQ tablet Take 2 tablets (40 mEq total)  by mouth daily. 6 tablet 0   No facility-administered medications prior to visit.    PAST MEDICAL HISTORY: Past Medical History:  Diagnosis Date  . Asthma   . Cerebral palsy (HCC) Birth  . Mental retardation Birth  . Seizures (HCC)     PAST SURGICAL HISTORY: No past surgical history on file.  FAMILY HISTORY: Family History  Problem Relation Age of Onset  . Migraines Mother   . Cancer Mother        liver cancer  . Diabetes Maternal Aunt   . Hypertension Maternal Grandmother   . Colon cancer Maternal Grandfather 60  . Prostate cancer Maternal Grandfather   . Throat cancer Maternal Grandfather     SOCIAL HISTORY: Social History   Socioeconomic History  . Marital status: Single    Spouse name: Not on file  . Number of children: 0  . Years of education: dis.school  . Highest education level: Not on file  Occupational History    Comment: disabled0  Tobacco Use  . Smoking status: Never Smoker  . Smokeless tobacco: Never Used  Vaping Use  . Vaping Use: Never used  Substance and Sexual Activity  .  Alcohol use: No  . Drug use: No  . Sexual activity: Never  Other Topics Concern  . Not on file  Social History Narrative   Patricia White is right handed      Current Social History 04/13/2017           Patricia White lives with grandmother([name] Mila Palmer), maternal aunt Kaidence Sant), female cousin and maternal uncle in one level home 04/13/2017   Transportation: Patricia White relies on aunt for transportation 04/13/2017   Important Relationships Family 04/13/2017    Pets: [place], "Harmony" 04/13/2017   Education / Work:  12 th grade/ Disabled 04/13/2017   Interests / Fun: Going to [employer], out for pizza, scary movies 04/13/2017   Religious / Personal Beliefs: Attends Non-Denominational Church 04/13/2017   L. Ducatte, RN, BSN                                                                                                 Social Determinants of Health   Financial Resource Strain:   .  Difficulty of Paying Living Expenses:   Food Insecurity:   . Worried About Programme researcher, broadcasting/film/video in the Last Year:   . Barista in the Last Year:   Transportation Needs:   . Freight forwarder (Medical):   Marland Kitchen Lack of Transportation (Non-Medical):   Physical Activity:   . Days of Exercise per Week:   . Minutes of Exercise per Session:   Stress:   . Feeling of Stress :   Social Connections:   . Frequency of Communication with Friends and Family:   . Frequency of Social Gatherings with Friends and Family:   . Attends Religious Services:   . Active Member of Clubs or Organizations:   . Attends Banker Meetings:   Marland Kitchen Marital Status:   Intimate Partner Violence:   . Fear of Current or Ex-Partner:   . Emotionally Abused:   Marland Kitchen Physically Abused:   . Sexually Abused:       PHYSICAL EXAM  Vitals:   03/22/20 1002  BP: 122/88  Pulse: (!) 110  Weight: 173 lb 3.2 oz (78.6 kg)  Height: 5' 2.5" (1.588 m)   Body mass index is 31.17 kg/m.  Generalized: Well developed, pleasant middle-aged African-American female, in no acute distress  Cardiology: normal rate and rhythm, no murmur noted  Neurological examination  Mentation: Alert. Follows all commands speech and cooperative with exam Cranial nerve II-XII: Pupils were equal round reactive to light. Extraocular movements were full, visual field were full on confrontational test. Facial sensation and strength were normal. Uvula tongue midline. Head turning and shoulder shrug  were normal and symmetric. Motor: The motor testing reveals 5 over 5 strength of right upper and lower extremity.  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 No evidence of extinction is noted.  Coordination: Cerebellar testing finger-nose-finger and heel-to-shin performed accurately on right side Gait and station: spastic, left hemiplegic gait, stable  without assistive device.  Reflexes: Deep tendon reflexes slightly  increased left side   DIAGNOSTIC DATA (LABS, IMAGING, TESTING) - I reviewed Patricia White records, labs, notes, testing and imaging myself where available.  No flowsheet data found.   Lab Results  Component Value Date   WBC 8.6 01/19/2020   HGB 12.1 01/19/2020   HCT 37.3 01/19/2020   MCV 94.4 01/19/2020   PLT 281 01/19/2020      Component Value Date/Time   NA 132 (L) 01/19/2020 2148   NA 138 09/09/2019 1210   K 2.6 (LL) 01/19/2020 2148   CL 92 (L) 01/19/2020 2148   CO2 24 01/19/2020 2148   GLUCOSE 133 (H) 01/19/2020 2148   BUN 10 01/19/2020 2148   BUN 5 (L) 09/09/2019 1210   CREATININE 0.80 01/19/2020 2203   CALCIUM 8.6 (L) 01/19/2020 2148   PROT 9.0 (H) 01/19/2020 2148   PROT 7.8 09/09/2019 1210   ALBUMIN 3.2 (L) 01/19/2020 2148   ALBUMIN 4.1 09/09/2019 1210   AST 14 (L) 01/19/2020 2148   ALT 10 01/19/2020 2148   ALKPHOS 78 01/19/2020 2148   BILITOT 0.7 01/19/2020 2148   BILITOT <0.2 09/09/2019 1210   GFRNONAA >60 01/19/2020 2148   GFRAA >60 01/19/2020 2148   Lab Results  Component Value Date   CHOL 173 10/06/2019   HDL 53 10/06/2019   LDLCALC 95 10/06/2019   TRIG 143 10/06/2019   CHOLHDL 3.3 10/06/2019   No results found for: HGBA1C No results found for: VITAMINB12 No results found for: TSH     ASSESSMENT AND PLAN 49 y.o. year old female  has a past medical history of Asthma, Cerebral palsy (HCC) (Birth), Mental retardation (Birth), and Seizures (HCC). here with   Seizures -Continue Depakote, Tegretol and Topamax for seizure prophylaxis -Prior valproic acid level 74 -Prior carbamazepine level 10.7 -will obtain lab work at follow-up visit as only annual monitoring required   Follow-up in 6 months or call earlier if needed    Meds ordered this encounter  Medications  . TOPAMAX 25 MG tablet    Sig: Take 3 tablets (75 mg total) by mouth 2 (two) times daily.    Dispense:  540 tablet     Refill:  2  . carbamazepine (TEGRETOL) 200 MG tablet    Sig: Take 1.5 tablets (300 mg total) by mouth in the morning and at bedtime.    Dispense:  270 tablet    Refill:  1  . DEPAKOTE ER 250 MG 24 hr tablet    Sig: Take 2 tablets (500 mg total) by mouth 2 (two) times daily.    Dispense:  360 tablet    Refill:  1      Patricia White of Dr. Marjory LiesPenumalli who is out of the office today therefore will send to work in MD Dr. Terrace ArabiaYan   I spent 20 minutes of face-to-face and non-face-to-face time with Patricia White and aunt.  This included previsit chart review, lab review, study review, order entry, electronic health record documentation, Patricia White/family education regarding history of seizures, ongoing use of AEDs, avoiding seizure triggers and answered all questions to Patricia White and aunt satisfaction  Ihor AustinJessica McCue, AGNP-BC  [facility] [address] [place], [place] [id number]  Phone [phone] Fax [phone] Note: This document was prepared with digital dictation and possible smart phrase technology. Any transcriptional errors that result from this process are unintentional.

## 2020-06-24 ENCOUNTER — Ambulatory Visit (INDEPENDENT_AMBULATORY_CARE_PROVIDER_SITE_OTHER): Payer: Medicare HMO

## 2020-06-24 ENCOUNTER — Other Ambulatory Visit: Payer: Self-pay

## 2020-06-24 DIAGNOSIS — Z23 Encounter for immunization: Secondary | ICD-10-CM | POA: Diagnosis not present

## 2020-06-24 NOTE — Progress Notes (Signed)
   Covid-19 Vaccination Clinic  Name:  Patricia White    MRN: [id number] DOB: 01-15-1971  06/24/2020  [name] was observed post Covid-19 immunization for 15 minutes without incident. She was provided with Vaccine Information Sheet and instruction to access the V-Safe system.   [name] was instructed to call 911 with any severe reactions post vaccine: Marland Kitchen Difficulty breathing  . Swelling of face and throat  . A fast heartbeat  . A bad rash all over body  . Dizziness and weakness   Booster administered RD without complication.   Flu Vaccine administered LD without complication.

## 2020-06-28 NOTE — Congregational Nurse Program (Signed)
Contact with grandmother, assisted with medication purchase.

## 2020-08-25 ENCOUNTER — Emergency Department (HOSPITAL_COMMUNITY)
Admission: EM | Admit: 2020-08-25 | Discharge: 2020-08-25 | Disposition: A | Discharge status: Home / Self Care | Payer: Medicare HMO | Attending: Emergency Medicine | Admitting: Emergency Medicine

## 2020-08-25 DIAGNOSIS — J45909 Unspecified asthma, uncomplicated: Secondary | ICD-10-CM | POA: Diagnosis not present

## 2020-08-25 DIAGNOSIS — I10 Essential (primary) hypertension: Secondary | ICD-10-CM | POA: Insufficient documentation

## 2020-08-25 DIAGNOSIS — T783XXD Angioneurotic edema, subsequent encounter: Secondary | ICD-10-CM | POA: Diagnosis not present

## 2020-08-25 LAB — CBC WITH DIFFERENTIAL/PLATELET
Abs Immature Granulocytes: 0.01 10*3/uL (ref 0.00–0.07)
Basophils Absolute: 0 10*3/uL (ref 0.0–0.1)
Basophils Relative: 0 %
Eosinophils Absolute: 0 10*3/uL (ref 0.0–0.5)
Eosinophils Relative: 1 %
HCT: 35.1 % — ABNORMAL LOW (ref 36.0–46.0)
Hemoglobin: 11.7 g/dL — ABNORMAL LOW (ref 12.0–15.0)
Immature Granulocytes: 0 %
Lymphocytes Relative: 38 %
Lymphs Abs: 2 10*3/uL (ref 0.7–4.0)
MCH: 30.2 pg (ref 26.0–34.0)
MCHC: 33.3 g/dL (ref 30.0–36.0)
MCV: 90.5 fL (ref 80.0–100.0)
Monocytes Absolute: 0.3 10*3/uL (ref 0.1–1.0)
Monocytes Relative: 6 %
Neutro Abs: 2.9 10*3/uL (ref 1.7–7.7)
Neutrophils Relative %: 55 %
Platelets: 310 10*3/uL (ref 150–400)
RBC: 3.88 MIL/uL (ref 3.87–5.11)
RDW: 12.6 % (ref 11.5–15.5)
WBC: 5.3 10*3/uL (ref 4.0–10.5)
nRBC: 0 % (ref 0.0–0.2)

## 2020-08-25 LAB — BASIC METABOLIC PANEL
Anion gap: 12 (ref 5–15)
BUN: 7 mg/dL (ref 6–20)
CO2: 26 mmol/L (ref 22–32)
Calcium: 8.9 mg/dL (ref 8.9–10.3)
Chloride: 97 mmol/L — ABNORMAL LOW (ref 98–111)
Creatinine, Ser: 0.86 mg/dL (ref 0.44–1.00)
GFR, Estimated: >60 mL/min (ref >60)
Glucose, Bld: 108 mg/dL — ABNORMAL HIGH (ref 70–99)
Potassium: 2.9 mmol/L — ABNORMAL LOW (ref 3.5–5.1)
Sodium: 135 mmol/L (ref 135–145)

## 2020-08-25 MED ORDER — DIPHENHYDRAMINE HCL 25 MG PO CAPS
25.0000 mg | ORAL_CAPSULE | Freq: Once | ORAL | Status: AC
  Administered 2020-08-25: 25 mg via ORAL
  Filled 2020-08-25: qty 1

## 2020-08-25 MED ORDER — PREDNISONE 20 MG PO TABS
60.0000 mg | ORAL_TABLET | Freq: Once | ORAL | Status: AC
  Administered 2020-08-25: 60 mg via ORAL
  Filled 2020-08-25: qty 3

## 2020-08-25 MED ORDER — METHYLPREDNISOLONE SODIUM SUCC 125 MG IJ SOLR: 125.0000 mg | Freq: Once | INTRAMUSCULAR | Status: DC

## 2020-08-25 MED ORDER — PREDNISONE 10 MG PO TABS: 40.0000 mg | ORAL_TABLET | Freq: Every day | ORAL | 0 refills | Status: AC

## 2020-08-25 MED ORDER — EPINEPHRINE 0.3 MG/0.3ML IJ SOAJ
0.3000 mg | Freq: Once | INTRAMUSCULAR | Status: AC
  Administered 2020-08-25: 0.3 mg via INTRAMUSCULAR
  Filled 2020-08-25: qty 0.3

## 2020-08-25 MED ORDER — POTASSIUM CHLORIDE CRYS ER 20 MEQ PO TBCR
40.0000 meq | EXTENDED_RELEASE_TABLET | Freq: Once | ORAL | Status: AC
  Administered 2020-08-25: 40 meq via ORAL
  Filled 2020-08-25: qty 2

## 2020-08-25 MED ORDER — DIPHENHYDRAMINE HCL 50 MG/ML IJ SOLN: 25.0000 mg | Freq: Once | INTRAMUSCULAR | Status: DC

## 2020-08-25 NOTE — ED Provider Notes (Signed)
[facility] EMERGENCY DEPARTMENT Provider Note   CSN: [id number] Arrival date & time: 08/25/20  1612     History Chief Complaint  Patient presents with  . Angioedema    Patricia White is a 50 y.o. female.  Angioedema, started today after medications this morning.  Lip swelling, family says voice sounds different, no difficulty breathing no vomiting no rash.  Overall well-appearing tolerating p.o. per family.  No recent illness no recent trauma.  No change in medications.        Past Medical History:  Diagnosis Date  . Asthma   . Cerebral palsy (HCC) Birth  . Mental retardation Birth  . Seizures [facility])     Patient Active Problem List   Diagnosis Date Noted  . Angioedema 12/10/2019  . Lesion of mandible 12/10/2019  . Seasonal allergies 11/20/2019  . Essential hypertension 10/23/2019  . Seizure disorder (HCC) 02/23/2016  . 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  . 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 Obesity (BMI 30.0-34.9) 10/13/2009  . Infantile cerebral palsy (HCC) 10/11/2006    No past surgical history on file.   OB History   No obstetric history on file.     Family History  Problem Relation Age of Onset  . Migraines Mother   . Cancer Mother        liver cancer  . Diabetes Maternal Aunt   . Hypertension Maternal Grandmother   . Colon cancer Maternal Grandfather 60  . Prostate cancer Maternal Grandfather   . Throat cancer Maternal Grandfather     Social History   Tobacco Use  . Smoking status: Never Smoker  . Smokeless tobacco: Never Used  Vaping Use  . Vaping Use: Never used  Substance Use Topics  . Alcohol use: No  . Drug use: No    Home Medications Prior to Admission medications   Medication Sig Start Date End Date Taking? Authorizing Provider  predniSONE (DELTASONE) 10 MG tablet Take 4 tablets (40 mg total) by mouth daily for 4 days. 08/25/20 08/29/20 Yes Sabino Donovan, MD  carbamazepine (TEGRETOL) 200  MG tablet Take 1.5 tablets (300 mg total) by mouth in the morning and at bedtime. 03/22/20   Ihor Austin, NP  DEPAKOTE ER 250 MG 24 hr tablet Take 2 tablets (500 mg total) by mouth 2 (two) times daily. 03/22/20   Ihor Austin, NP  EPINEPHrine 0.3 mg/0.3 mL IJ SOAJ injection Inject 0.3 mLs (0.3 mg total) into the muscle as needed for anaphylaxis. 01/22/20   Marcelyn Bruins, MD  indapamide (LOZOL) 1.25 MG tablet Take 1 tablet (1.25 mg total) by mouth daily. 10/23/19   Mullis, Kiersten P, DO  TOPAMAX 25 MG tablet Take 3 tablets (75 mg total) by mouth 2 (two) times daily. 03/22/20   Ihor Austin, NP    Allergies    Patient has no known allergies.  Review of Systems   Review of Systems  Constitutional: Negative for chills and fever.  HENT: Positive for facial swelling and voice change. Negative for congestion, rhinorrhea and trouble swallowing.   Respiratory: Negative for cough, chest tightness, shortness of breath and stridor.   Cardiovascular: Negative for chest pain and palpitations.  Gastrointestinal: Negative for diarrhea, nausea and vomiting.  Genitourinary: Negative for difficulty urinating and dysuria.  Musculoskeletal: Negative for arthralgias and back pain.  Skin: Negative for rash and wound.  Neurological: Negative for light-headedness and headaches.    Physical Exam Updated Vital Signs BP 120/80   Pulse 88  Temp 98.2 F (36.8 C) (Oral)   Resp 19   SpO2 94%   Physical Exam Vitals and nursing note reviewed. Exam conducted with a chaperone present.  Constitutional:      General: She is not in acute distress.    Appearance: Normal appearance.  HENT:     Head: Normocephalic and atraumatic.     Comments: Swelling of the upper and lower lips, no tenderness to palpation, normal range of motion of the jaw, tongue does not appear enlarged uvula does not appear enlarged, clear posterior oropharynx    Nose: No rhinorrhea.  Eyes:     General:        Right eye: No  discharge.        Left eye: No discharge.     Conjunctiva/sclera: Conjunctivae normal.  Cardiovascular:     Rate and Rhythm: Normal rate and regular rhythm.  Pulmonary:     Effort: Pulmonary effort is normal. No respiratory distress.     Breath sounds: No stridor. No wheezing or rales.  Abdominal:     General: Abdomen is flat. There is no distension.     Palpations: Abdomen is soft.  Musculoskeletal:        General: No tenderness or signs of injury.  Skin:    General: Skin is warm and dry.  Neurological:     General: No focal deficit present.     Mental Status: She is alert. Mental status is at baseline.  Psychiatric:        Mood and Affect: Mood normal.        Behavior: Behavior normal.     ED Results / Procedures / Treatments   Labs (all labs ordered are listed, but only abnormal results are displayed) Labs Reviewed  CBC WITH DIFFERENTIAL/PLATELET - Abnormal; Notable for the following components:      Result Value   Hemoglobin 11.7 (*)    HCT 35.1 (*)    All other components within normal limits  BASIC METABOLIC PANEL - Abnormal; Notable for the following components:   Potassium 2.9 (*)    Chloride 97 (*)    Glucose, Bld 108 (*)    All other components within normal limits    EKG None  Radiology No results found.  Procedures Procedures (including critical care time)  Medications Ordered in ED Medications  EPINEPHrine (EPI-PEN) injection 0.3 mg (0.3 mg Intramuscular Given 08/25/20 2202)  potassium chloride SA (KLOR-CON) CR tablet 40 mEq (40 mEq Oral Given 08/25/20 2202)  predniSONE (DELTASONE) tablet 60 mg (60 mg Oral Given 08/25/20 2202)  diphenhydrAMINE (BENADRYL) capsule 25 mg (25 mg Oral Given 08/25/20 2202)    ED Course  I have reviewed the triage vital signs and the nursing notes.  Pertinent labs & imaging results that were available during my care of the patient were reviewed by me and considered in my medical decision making (see chart for  details).    MDM Rules/Calculators/A&P                          Patient with history of cerebral palsy and seizures comes in today with angioedema of the lips.  History of this in the past.  Has had outpatient work-up, I reviewed the notes, it does not appear that it is hereditary based on the laboratory studies.  It does not appear that it was hypersensitivity however she does have significant allergies to many things does require EpiPen for safety.  She  has no respiratory symptoms GI symptoms neurologic symptoms.  Just lip swelling.  Feels of her voice sounds different to, she is not in any respiratory distress she has no pulmonary findings on exam.  Vital signs are stable.  We will treat her with epinephrine as she has a history of needing epinephrine.  We will treat her with steroids and antihistamines and observe until symptoms start to improve and then recommend outpatient follow-up.  Laboratory studies done in triage shows stable CBC and chronic hypokalemia, will replace some potassium.  Patient has much improvement of symptoms after epinephrine injection as well as steroids and antihistamines.  She has had extensive work-up for this in the past, never found a trigger.  She is told to continue her current medication regimen, she is told to take steroids for the next 4 days as well as antihistamines for the next 48 hours and is given return precautions.  They still have EpiPen at home as needed.  Final Clinical Impression(s) / ED Diagnoses Final diagnoses:  Angioedema, subsequent encounter    Rx / DC Orders ED Discharge Orders         Ordered    predniSONE (DELTASONE) 10 MG tablet  Daily        08/25/20 2327           Sabino Donovan, MD 08/26/20 1441

## 2020-08-25 NOTE — Discharge Instructions (Addendum)
Take the prednisone once daily for 4 more days.  Take 25 mg of Benadryl every 6 hours for the next 48 hours.  Follow-up with your primary care provider regarding medication management and possible complications.

## 2020-08-25 NOTE — ED Triage Notes (Signed)
Pt arrives to ED via POV with family member who states she woke up with swelling to top and bottom lip, no tongue swelling, airway is intact.

## 2020-09-06 ENCOUNTER — Other Ambulatory Visit: Payer: Self-pay | Admitting: Adult Health

## 2020-09-06 DIAGNOSIS — G40909 Epilepsy, unspecified, not intractable, without status epilepticus: Secondary | ICD-10-CM

## 2020-09-17 ENCOUNTER — Ambulatory Visit (INDEPENDENT_AMBULATORY_CARE_PROVIDER_SITE_OTHER): Payer: Medicare HMO | Admitting: Allergy

## 2020-09-17 ENCOUNTER — Encounter: Payer: Self-pay | Admitting: Allergy

## 2020-09-17 ENCOUNTER — Other Ambulatory Visit: Payer: Self-pay

## 2020-09-17 VITALS — BP 118/74 | HR 127 | Temp 98.3°F | Resp 18 | Ht 62.5 in | Wt 167.0 lb

## 2020-09-17 DIAGNOSIS — T783XXD Angioneurotic edema, subsequent encounter: Secondary | ICD-10-CM

## 2020-09-17 DIAGNOSIS — L501 Idiopathic urticaria: Secondary | ICD-10-CM | POA: Diagnosis not present

## 2020-09-17 MED ORDER — FAMOTIDINE 20 MG PO TABS: 20.0000 mg | ORAL_TABLET | Freq: Two times a day (BID) | ORAL | 5 refills | Status: DC

## 2020-09-17 MED ORDER — EPINEPHRINE 0.3 MG/0.3ML IJ SOAJ: 0.3000 mg | INTRAMUSCULAR | 3 refills | Status: DC | PRN

## 2020-09-17 NOTE — Patient Instructions (Addendum)
Swelling (angioedema) and Hives (urticaria)  - at this time etiology of swelling is mostly like spontaneous however there is concern it could be related to dental hygiene.  Agree with evaluation by oral surgeon.    Swelling can be caused by a variety of different triggers including illness/infection, foods, medications, stings, exercise, pressure, vibrations, extremes of temperature to name a few however majority of the time there is no identifiable trigger.  -labwork only revealing of environmental allergy sensitivity (see below) and inflammation (inflammatory marker elevated)  - would recommend taking Allegra 180mg  1 tab twice a day with Pepcid 20mg  1 tab twice a day   - for quicker relief of symptoms take prednisone pack for next 5 days as directed  - discussed Xolair monthly injections today to help with hive and swelling control.  Benefits, risks and protocol of Xolair discussed today.  With notify Tammy, our biologic medication coordinator, to start approval process; she will contact you regarding this process.   - you have access to Epipen due to noted swelling of the tongue and airway.  Follow the emergency action for symptoms if occurring would warrant use of Epipen  - environmental allergy panel is positive to grass pollen, tree pollen, dog dander, mold, cat dander, weed pollen  Follow-up in 3 months or sooner if needed

## 2020-09-17 NOTE — Progress Notes (Signed)
Follow-up Note  RE: TONA QUALLEY MRN: [id number] DOB: 02-Nov-1970 Date of Office Visit: 09/17/2020   History of present illness: Patricia White is a 50 y.o. female presenting today for follow-up of angioedema.  She presents today with her aunt who is her guardian.  She was last seen in the office on 01/22/2020 by myself.  Her aunt states she has developed hives alongside the swelling.  The hives are super itchy and she is constantly scratching her skin and breaking the skin and scabbing.  They both report that the Zyrtec has not been helpful even at twice a day dosing.  They have been using Benadryl as well which also does not help and have managing the itch.  Nothing has changed in her environment.  She has not had any new foods.  No change in medications.  No stings.  No change in soaps/lotions/detergents. She has access to an epinephrine device which they have not needed to use. She did present to the ED on 08/25/2020 for angioedema episode where she had lip swelling.  Her exam was noted to have "swelling of the upper and lower lips, no tenderness to palpation, normal range of motion of the jaw, tongue does not appear enlarged uvula does not appear enlarged, clear posterior oropharynx".  She was treated with epinephrine, potassium chloride, prednisone by mouth and Benadryl by mouth.  She was noted to have a slightly lower potassium level on her i-STAT.     Review of systems: Review of Systems  Constitutional: Negative.   HENT: Negative.   Eyes: Negative.   Respiratory: Negative.   Cardiovascular: Negative.   Gastrointestinal: Negative.   Musculoskeletal: Negative.   Skin: Positive for itching and rash.  Neurological: Negative.     All other systems negative unless noted above in HPI  Past medical/social/surgical/family history have been reviewed and are unchanged unless specifically indicated below.  No changes  Medication List: Current Outpatient Medications  Medication  Sig Dispense Refill  . DEPAKOTE ER 250 MG 24 hr tablet Take 2 tablets (500 mg total) by mouth 2 (two) times daily. 360 tablet 1  . famotidine (PEPCID) 20 MG tablet Take 1 tablet (20 mg total) by mouth 2 (two) times daily. 60 tablet 5  . indapamide (LOZOL) 1.25 MG tablet Take 1 tablet (1.25 mg total) by mouth daily. 90 tablet 3  . TEGRETOL 200 MG tablet TAKE ONE AND A HALF TABLETS BY MOUTH 2 TIMES DAILY EVERY MORNING AND AT BEDTIME 90 tablet 1  . TOPAMAX 25 MG tablet Take 3 tablets (75 mg total) by mouth 2 (two) times daily. 540 tablet 2  . EPINEPHrine 0.3 mg/0.3 mL IJ SOAJ injection Inject 0.3 mg into the muscle as needed for anaphylaxis. 2 each 3   No current facility-administered medications for this visit.     Known medication allergies: No Known Allergies   Physical examination: Blood pressure 118/74, pulse (!) 127, temperature 98.3 F (36.8 C), temperature source Temporal, resp. rate 18, height 5' 2.5" (1.588 m), weight 167 lb (75.8 kg), SpO2 96 %.  General: Alert, interactive, in no acute distress. HEENT: PERRLA, TMs pearly gray, turbinates non-edematous without discharge, post-pharynx non erythematous. Neck: Supple without lymphadenopathy. Lungs: Clear to auscultation without wheezing, rhonchi or rales. {no increased work of breathing. CV: Normal S1, S2 without murmurs. Abdomen: Nondistended, nontender. Skin: Scattered erythematous urticarial type lesions primarily located Bilateral arms extending up to the forearms, chest, abdomen, back , nonvesicular. Extremities:  No clubbing, cyanosis or  edema. Neuro:   Grossly intact.  Diagnositics/Labs: Labs:  Component     Latest Ref Rng & Units 01/22/2020  IgE (Immunoglobulin E), Serum     6 - 495 IU/mL 861 (H)  D Pteronyssinus IgE     Class 0 kU/L <0.10  D Farinae IgE     Class 0 kU/L <0.10  Cat Dander IgE     Class I kU/L 0.45 (A)  Dog Dander IgE     Class III kU/L 1.79 (A)  [place] Grass IgE     Class IV kU/L 4.49 (A)   Timothy Grass IgE     Class V kU/L 37.70 (A)  Johnson Grass IgE     Class IV kU/L 8.19 (A)  Cockroach, German IgE     Class 0 kU/L <0.10  Penicillium Chrysogen IgE     Class II kU/L 1.40 (A)  Cladosporium Herbarum IgE     Class 0 kU/L <0.10  Aspergillus Fumigatus IgE     Class III kU/L 1.54 (A)  Alternaria Alternata IgE     Class II kU/L 1.05 (A)  Maple/Box Elder IgE     Class 0 kU/L <0.10  Common Silver Charletta Cousin IgE     Class 0 kU/L <0.10  Cedar, [place] IgE     Class 0 kU/L <0.10  Oak, White IgE     Class 0 kU/L <0.10  Elm, American IgE     Class 0 kU/L <0.10  Cottonwood IgE     Class 0 kU/L <0.10  Pecan, Hickory IgE     Class V kU/L 20.80 (A)  White Mulberry IgE     Class 0 kU/L <0.10  Ragweed, Short IgE     Class 0/I kU/L 0.12 (A)  Pigweed, Rough IgE     Class 0 kU/L <0.10  Sheep Sorrel IgE Qn     Class 0/I kU/L 0.13 (A)  Mouse Urine IgE     Class 0 kU/L <0.10  Beef (Bos spp) IgE     <0.35 kU/L 0.18  Class Interpretation      0/1  Lamb/Mutton (Ovis spp) IgE     <0.35 kU/L 0.12  Class Interpretation      0/1  Pork (Sus spp) IgE     <0.35 kU/L <0.10  Class Interpretation      0  Alpha Gal IgE*     <0.10 kU/L <0.10  Complement C4, Serum     12 - 38 mg/dL 57 (H)  C2 Esterase Inhibitor, Serum     21 - 39 mg/dL 32  Sed Rate     0 - 32 mm/hr 85 (H)  Tryptase     2.2 - 13.2 ug/L 7.5  C1 Est.Inhib.Funct.     %mean normal 90  HAE Interpretation      Comment    Assessment and plan:   Swelling (angioedema) and Hives (urticaria)  - at this time etiology of swelling is mostly like spontaneous however there is concern it could be related to dental hygiene.  Agree with evaluation by oral surgeon.    Swelling can be caused by a variety of different triggers including illness/infection, foods, medications, stings, exercise, pressure, vibrations, extremes of temperature to name a few however majority of the time there is no identifiable trigger.  -labwork only  revealing of environmental allergy sensitivity (see below) and inflammation (inflammatory marker elevated)  - would recommend taking Allegra 180mg  1 tab twice a day with Pepcid 20mg  1 tab twice a day   -  for quicker relief of symptoms take prednisone pack for next 5 days as directed  - discussed Xolair monthly injections today to help with hive and swelling control.  Benefits, risks and protocol of Xolair discussed today.  With notify Tammy, our biologic medication coordinator, to start approval process; she will contact you regarding this process.   - you have access to Epipen due to noted swelling of the tongue and airway.  Follow the emergency action for symptoms if occurring would warrant use of Epipen  - environmental allergy panel is positive to grass pollen, tree pollen, dog dander, mold, cat dander, weed pollen  Follow-up in 3 months or sooner if needed  I appreciate the opportunity to take part in Janet's care. Please do not hesitate to contact me with questions.  Sincerely,   Margo Aye, MD Allergy/Immunology [facility]

## 2020-09-23 ENCOUNTER — Ambulatory Visit (INDEPENDENT_AMBULATORY_CARE_PROVIDER_SITE_OTHER): Payer: Medicare HMO | Admitting: Adult Health

## 2020-09-23 ENCOUNTER — Telehealth: Payer: Self-pay | Admitting: *Deleted

## 2020-09-23 ENCOUNTER — Encounter: Payer: Self-pay | Admitting: Adult Health

## 2020-09-23 VITALS — BP 130/81 | HR 99 | Ht 62.0 in | Wt 168.0 lb

## 2020-09-23 DIAGNOSIS — Z5181 Encounter for therapeutic drug level monitoring: Secondary | ICD-10-CM

## 2020-09-23 DIAGNOSIS — G40909 Epilepsy, unspecified, not intractable, without status epilepticus: Secondary | ICD-10-CM

## 2020-09-23 MED ORDER — TOPAMAX 25 MG PO TABS
75.0000 mg | ORAL_TABLET | Freq: Two times a day (BID) | ORAL | 3 refills | Status: DC
Start: 2020-09-23 — End: 2021-09-29

## 2020-09-23 MED ORDER — DEPAKOTE ER 250 MG PO TB24: 500.0000 mg | ORAL_TABLET | Freq: Two times a day (BID) | ORAL | 3 refills | Status: DC

## 2020-09-23 NOTE — Progress Notes (Signed)
PATIENT: Patricia White DOB: Jan 07, 1971  REASON FOR VISIT: Seizure follow up HISTORY FROM: patient  Chief Complaint  Patient presents with  . Follow-up    Rm 14 with aunt (delores) Pt is well per aunt      HISTORY OF PRESENT ILLNESS:  Today, 09/23/2020, Patricia White returns for 54-month seizure follow-up accompanied by her a.m.  Stable since prior visit without any additional seizure activity.  Remains on Depakote, Tegretol and Topamax tolerating without side effects all brand-name prescriptions as generics do not control her seizures. Will repeat lab work today.  No concerns at this time.   History provided for reference purposes only Update 03/22/2020 JM: Ms. Buffkin returns for seizure follow-up accompanied by her aunt. ED evaluation on 09/14/2019 for suspected seizure with complaints of increased right-sided weakness and decreased ability to speak with abrasion noted on the side of her tongue.  Stroke/TIA ruled out.  No medication changes recommended.  Symptoms rapidly resolved and work-up unremarkable and discharged home. She has not had any reoccurring seizure type symptoms or events.  Remains on Depakote ER 500 mg twice daily, Tegretol 300 mg twice daily and Topamax 75 mg twice daily.  Tolerating well without side effects.  Prior lab work for therapeutic drug level monitoring 6 months ago satisfactory.  No concerns at this time.   Update 09/09/2019 AL: Patricia White is a 50 y.o. female here today for follow up for seizures. She presents today with her grandmother who aids in history. Her grandmother reports that overall she is doing very well. She continues Depakote ER 500 mg twice daily, Tegretol 300 mg twice daily and Topamax 75 mg twice daily. She is tolerating medications well with no obvious adverse effects. Her grandmother denies any recent seizure activity. Last seizure in 07/2018. She does continue to note intermittent jerking of her upper and lower extremities. Jerking  typically last a couple of seconds. Micha is able to communicate during these events. There is no tonic-clonic movement. No tongue injuries or incontinence. Jerking has been persistent for years. 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 Her grandmother also notes that over the last couple days, she has noted very mild swelling of patient's right cheek and left upper lip. There has been no difficulty eating or swallowing. No respiratory difficulty. No other rash or swelling.  Patricia White is able to dress herself. She does require assistance with bathing. She does not cook or drive. She lives with her grandmother who is primary caregiver as her mother passed away about 8 years ago. She has not been seen by PCP recently. She does not usually have elevated blood pressures per grandmother. She is doing well today and without concerns.   Virtual visit update 02/05/2019 JM: [name] is a 50 year old female who is being seen today for follow-up for seizure management with underlying history of seizure disorder, moderate MR, congenital left hemiparesis abnormality of gait and history of cerebral palsy.  Initially scheduled for in office face-to-face visit but due to COVID-19 safety precautions, visit transition to telemedicine with patient's and grandmothers consent.    Grandmother reports "jerking and twitching spells" recently over the past 2 weeks and is questioning whether she needs dosage increase of her medications.  Per review of prior office visits, reports of myoclonus has been discussed previously.  Occurs approximately 1-2 times weekly. denies seizure activity with loss of consciousness.  Denies any recent changes to medications, diet, activity level or increased stressors.  Grandmother also endorses increased fatigue level and recent weight gain.  Recent weight 158lb and previous weight 6 months prior 176lb.  She reports ongoing compliance with valproate, carbamazepine and  topiramate and denies any side effects.  Blood work obtained at prior visit which were all satisfactory.  No further concerns at this time.  08/05/2018 visit JM: Patient is being seen today due to seizure at home on 07/08/2018.  She is accompanied by her grandmother.  She did have a second seizure with EMS while being transported to ED.  Per ED notes, she continue to take valproic acid, carbamazepine and topiramate but had run out of her carbamazepine 3 days prior.  It was felt as though her seizure was likely running out of her carbamazepine as labs confirmed undetectable level.  She has not had any seizure activity in the past 4 years.  Valproic acid level 45. Since discharge home, denies any recent seizure activity. She continues on valproic acid, carbamazepine and topiramate without side effects. 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 She only wears this brace when she is out of the house.  Denies any recent falls.  Grandmother is concerned as her last menses was in October without menses in November or December. She is typically regular.  No further concerns at this time.   REVIEW OF SYSTEMS: Out of a complete 14 system review of symptoms, the patient complains only of the following symptoms as described in HPI and all other reviewed systems are negative.  ALLERGIES: No Known Allergies  HOME MEDICATIONS: Outpatient Medications Prior to Visit  Medication Sig Dispense Refill  . DEPAKOTE ER 250 MG 24 hr tablet Take 2 tablets (500 mg total) by mouth 2 (two) times daily. 360 tablet 1  . EPINEPHrine 0.3 mg/0.3 mL IJ SOAJ injection Inject 0.3 mg into the muscle as needed for anaphylaxis. 2 each 3  . famotidine (PEPCID) 20 MG tablet Take 1 tablet (20 mg total) by mouth 2 (two) times daily. 60 tablet 5  . indapamide (LOZOL) 1.25 MG tablet Take 1 tablet (1.25 mg total) by mouth daily. 90 tablet 3  . TEGRETOL 200 MG tablet TAKE ONE AND A HALF TABLETS BY  MOUTH 2 TIMES DAILY EVERY MORNING AND AT BEDTIME 90 tablet 1  . TOPAMAX 25 MG tablet Take 3 tablets (75 mg total) by mouth 2 (two) times daily. 540 tablet 2   No facility-administered medications prior to visit.    PAST MEDICAL HISTORY: Past Medical History:  Diagnosis Date  . Asthma   . Cerebral palsy (HCC) Birth  . Mental retardation Birth  . Seizures (HCC)     PAST SURGICAL HISTORY: History reviewed. No pertinent surgical history.  FAMILY HISTORY: Family History  Problem Relation Age of Onset  . Migraines Mother   . Cancer Mother        liver cancer  . Diabetes Maternal Aunt   . Hypertension Maternal Grandmother   . Colon cancer Maternal Grandfather 60  . Prostate cancer Maternal Grandfather   . Throat cancer Maternal Grandfather     SOCIAL HISTORY: Social History   Socioeconomic History  . Marital status: Single    Spouse name: Not on file  . Number of children: 0  . Years of education: dis.school  . Highest education level: Not on file  Occupational History    Comment: disabled0  Tobacco Use  . Smoking status: Never Smoker  . Smokeless tobacco: Never Used  Vaping Use  .  Vaping Use: Never used  Substance and Sexual Activity  . Alcohol use: No  . Drug use: No  . Sexual activity: Never  Other Topics Concern  . Not on file  Social History Narrative   Patient is right handed      Current Social History 04/13/2017           Patient lives with grandmother([name] Mila Palmer), maternal aunt Emmalee Kopec), female cousin and maternal uncle in one level home 04/13/2017   Transportation: Patient relies on aunt for transportation 04/13/2017   Important Relationships Family 04/13/2017    Pets: [place], "[place]arm[place]" 04/13/2017   Education / Work:  12 th grade/ Disabled 04/13/2017   Interests / Fun: Going to [employer], out for pizza, scary movies 04/13/2017   Religious / Personal Beliefs: Attends Non-Denominational Church 04/13/2017   L. Ducatte, RN, BSN                                                                                                  Social Determinants of Health   Financial Resource Strain: Not on file  Food Insecurity: Not on file  Transportation Needs: Not on file  Physical Activity: Not on file  Stress: Not on file  Social Connections: Not on file  Intimate Partner Violence: Not on file      PHYSICAL EXAM  Vitals:   09/23/20 1035  BP: 130/81  Pulse: 99  Weight: 168 lb (76.2 kg)  Height: 5\' 2"  (1.575 m)   Body mass index is 30.73 kg/m.  Generalized: Well developed, pleasant middle-aged African-American female, in no acute distress  Cardiology: normal rate and rhythm, no murmur noted  Neurological examination  Mentation: Alert. Follows all commands and cooperative with exam.  Limited verbal output with majority of history provided by aunt Cranial nerve II-XII: Pupils were equal round reactive to light. Extraocular movements were full, visual field were full on confrontational test. Facial sensation and strength were normal. Uvula tongue midline. Head turning and shoulder shrug  were normal and symmetric. Motor: The motor testing reveals 5 over 5 strength of right upper and lower extremity.  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 No evidence of extinction is noted.  Coordination: Cerebellar testing finger-nose-finger and heel-to-shin performed accurately on right side Gait and station: spastic, left hemiplegic gait, stable without assistive device.  Reflexes: Deep tendon reflexes slightly increased left side   DIAGNOSTIC DATA (LABS, IMAGING, TESTING) - I reviewed patient records, labs, notes, testing and imaging myself where available.  No flowsheet data found.   Lab Results  Component Value Date   WBC 5.3 08/25/2020   HGB 11.7 (L) 08/25/2020   HCT 35.1 (L) 08/25/2020   MCV 90.5 08/25/2020   PLT 310 08/25/2020       Component Value Date/Time   NA 135 08/25/2020 1642   NA 138 09/09/2019 1210   K 2.9 (L) 08/25/2020 1642   CL 97 (L) 08/25/2020 1642   CO2 26 08/25/2020 1642   GLUCOSE 108 (H) 08/25/2020  1642   BUN 7 08/25/2020 1642   BUN 5 (L) 09/09/2019 1210   CREATININE 0.86 08/25/2020 1642   CALCIUM 8.9 08/25/2020 1642   PROT 9.0 (H) 01/19/2020 2148   PROT 7.8 09/09/2019 1210   ALBUMIN 3.2 (L) 01/19/2020 2148   ALBUMIN 4.1 09/09/2019 1210   AST 14 (L) 01/19/2020 2148   ALT 10 01/19/2020 2148   ALKPHOS 78 01/19/2020 2148   BILITOT 0.7 01/19/2020 2148   BILITOT <0.2 09/09/2019 1210   GFRNONAA >60 08/25/2020 1642   GFRAA >60 01/19/2020 2148   Lab Results  Component Value Date   CHOL 173 10/06/2019   HDL 53 10/06/2019   LDLCALC 95 10/06/2019   TRIG 143 10/06/2019   CHOLHDL 3.3 10/06/2019   No results found for: HGBA1C No results found for: VITAMINB12 No results found for: TSH     ASSESSMENT AND PLAN 50 y.o. year old female  has a past medical history of Asthma, Cerebral palsy (HCC) (Birth), Mental retardation (Birth), and Seizures (HCC). here with    Seizures -Continue Depakote, Tegretol and Topamax for seizure prophylaxis - refills provided - needs to continue brand medications due to history of breakthrough seizures on generic -Lab work: Valproic acid, carbamazepine, hepatic function panel (CBC w/ diff and BMP completed 08/25/2020) -advised aunt to call office with any potential breakthrough seizures    Follow-up in 1 year or call earlier if needed    CC:  [facility] provider: Dr. Marjory Lies who is not in office today therefore will send to MD Dr. Marzetta Board, Dara Lords, DO    I spent 20 minutes of face-to-face and non-face-to-face time with patient and aunt.  This included previsit chart review, lab review, study review, order entry, electronic health record documentation, patient/family education regarding history of seizures, ongoing use of AEDs and indication for lab work,  avoiding seizure triggers and answered all questions to patient and aunt satisfaction  Ihor Austin, AGNP-BC  [facility] [address] [place], [place] [address]  Phone [phone] Fax [phone] Note: This document was prepared with digital dictation and possible smart phrase technology. Any transcriptional errors that result from this process are unintentional.

## 2020-09-23 NOTE — Telephone Encounter (Signed)
Topamax PA, key:  [id number], [id number]. On brand since 2014. Your information has been sent to [facility].

## 2020-09-23 NOTE — Progress Notes (Signed)
I have read the note, and I agree with the clinical assessment and plan.  Reef Achterberg A. Kyomi Hector, MD, PhD, FAAN Certified in Neurology, Clinical Neurophysiology, Sleep Medicine, Pain Medicine and Neuroimaging  [facility] [address], [address] [place], [place] [address] [phone]  

## 2020-09-24 LAB — HEPATIC FUNCTION PANEL
ALT: 12 IU/L (ref 0–32)
AST: 17 IU/L (ref 0–40)
Albumin: 4.2 g/dL (ref 3.8–4.8)
Alkaline Phosphatase: 96 IU/L (ref 44–121)
Bilirubin Total: 0.2 mg/dL (ref 0.0–1.2)
Bilirubin, Direct: <0.1 mg/dL (ref 0.00–0.40)
Total Protein: 8.7 g/dL — ABNORMAL HIGH (ref 6.0–8.5)

## 2020-09-24 LAB — CARBAMAZEPINE LEVEL, TOTAL: Carbamazepine (Tegretol), S: 8.3 ug/mL (ref 4.0–12.0)

## 2020-09-24 LAB — VALPROIC ACID LEVEL: Valproic Acid Lvl: 57 ug/mL (ref 50–100)

## 2020-09-27 ENCOUNTER — Telehealth: Payer: Self-pay | Admitting: *Deleted

## 2020-09-27 ENCOUNTER — Encounter: Payer: Self-pay | Admitting: *Deleted

## 2020-09-27 NOTE — Telephone Encounter (Addendum)
Topamax 25 mg tab Approved on February 10 PA Case: [id number], Status: Approved, Coverage Starts on: 08/14/2020 12:00:00 AM, Coverage Ends on: 08/13/2021 12:00:00 AM. Questions? Contact [phone]. Sent patient my chart to advise.

## 2020-09-27 NOTE — Telephone Encounter (Signed)
Relayed to grandmother and delores, aunt that pts labs results were all satisfactory, and to continue current plan.  She verbalized understanding.

## 2020-09-27 NOTE — Telephone Encounter (Signed)
-----   Message from Ihor Austin, NP sent at 09/27/2020  8:22 AM EST ----- Please advise patient/aunt that recent lab work all satisfactory -please continue current treatment regimen

## 2020-10-04 ENCOUNTER — Other Ambulatory Visit: Payer: Self-pay | Admitting: Family Medicine

## 2020-10-04 DIAGNOSIS — I10 Essential (primary) hypertension: Secondary | ICD-10-CM

## 2020-10-04 MED ORDER — INDAPAMIDE 1.25 MG PO TABS: ORAL_TABLET | ORAL | 0 refills | Status: DC

## 2020-10-04 NOTE — Addendum Note (Signed)
Addended by: Joana Reamer on: 10/04/2020 05:37 PM   Modules accepted: Orders

## 2020-10-04 NOTE — Addendum Note (Signed)
Addended by: Joana Reamer on: 10/04/2020 05:38 PM   Modules accepted: Orders

## 2020-10-04 NOTE — Telephone Encounter (Signed)
Please have patient schedule follow up visit for annual check and blood pressure monitoring as it has almost been a year since seen by PCP.

## 2020-10-06 ENCOUNTER — Telehealth: Payer: Self-pay | Admitting: Allergy

## 2020-10-06 ENCOUNTER — Other Ambulatory Visit: Payer: Self-pay | Admitting: Adult Health

## 2020-10-06 ENCOUNTER — Telehealth: Payer: Self-pay | Admitting: *Deleted

## 2020-10-06 DIAGNOSIS — G40909 Epilepsy, unspecified, not intractable, without status epilepticus: Secondary | ICD-10-CM

## 2020-10-06 MED ORDER — PREDNISONE 10 MG PO TABS: ORAL_TABLET | ORAL | 0 refills | Status: DC

## 2020-10-06 NOTE — Telephone Encounter (Signed)
Patient's guardian called and states that nothing is helping with her itching. Itching is all over, not in specific areas. Patient was still itching when she was on the 5 day prednisone from her 09/17/2020 visit. Patient has tried Careers adviser and Pepcid as recommended which did not help either.   Uses [name].  Please advise.

## 2020-10-06 NOTE — Telephone Encounter (Signed)
Spoke with Tammy regarding Xolair for her.  See telephone encounter from Tammy. Also advised Tammy to send in a prescription for a longer prednisone taper for better quicker relief of her itching and hives as starting Xolair does not typically relieve itching or hives right way.

## 2020-10-06 NOTE — Telephone Encounter (Addendum)
Tegretol 200 mg brand PA, key: [id number], G40.909, on brand tegretol since 2014.  Your information has been submitted to [facility]. [employer] will review the request and will issue a decision, typically within 3-7 days from your submission.  If [employer] has not responded in 3-7 days or if you have any questions about your ePA request, please contact [employer] at [phone].

## 2020-10-06 NOTE — Telephone Encounter (Addendum)
Brand Depakote ER 250 mg, PA Case: [id number],  Approved, Coverage Starts on: 08/14/2020 12:00:00 AM, Coverage Ends on: 08/13/2021 12:00:00 AM. Questions? Contact [phone]. Approval faxed to [facility] pharmacy.

## 2020-10-06 NOTE — Telephone Encounter (Signed)
Patient guardian had call in reference patients hives and itching.  I have gotten her approval today for Xolair finally after issues with [employer] and all PAs they have going on.  Buren Kos of approval and submit for Xolair and will reach out once delivery set to make appt to start.  Also in meantime per Dr Delorse Lek patient to start prednisone 10mg  30mg  twice a day x 3 days, 20mg  twice a day x 3 days, 10mg  twice a day x3 and stop and Rx sent to pharmacy and advised to patient guardian

## 2020-10-06 NOTE — Telephone Encounter (Signed)
Depakote ER brand PA, key: [id number], G40.909. Your information has been sent to [facility].

## 2020-10-06 NOTE — Telephone Encounter (Signed)
Dr Padgett please advise 

## 2020-10-07 NOTE — Telephone Encounter (Signed)
Tegretol brand, PA Case: [id number], Approved, Coverage Starts on: 08/14/2020 12:00:00 AM, Coverage Ends on: 08/13/2021 12:00:00 AM. Questions? Contact [phone]. Approval faxed to pharmacy.

## 2020-10-20 ENCOUNTER — Telehealth: Payer: Self-pay | Admitting: Allergy

## 2020-10-20 MED ORDER — MONTELUKAST SODIUM 10 MG PO TABS: 10.0000 mg | ORAL_TABLET | Freq: Every day | ORAL | 5 refills | Status: DC

## 2020-10-20 NOTE — Telephone Encounter (Signed)
Guardian called and stated Patricia White is still itching and she has tried everything and nothing is working.

## 2020-10-20 NOTE — Addendum Note (Signed)
Addended by: Robet Leu A on: 10/20/2020 02:05 PM   Modules accepted: Orders

## 2020-10-20 NOTE — Telephone Encounter (Signed)
Called and spoke to guardian and she agreed to start the singular. Guardian also stated that she would call and see about the Xolair. No questions or concerns at the time of the call.

## 2020-10-20 NOTE — Telephone Encounter (Signed)
Disregard

## 2020-10-20 NOTE — Telephone Encounter (Signed)
Called and spoke to patient's guardian and she expressed that patient is taking all the medications that were in her treatment plan and has yet to have any relief. Guardian states that she is still on the prednisone and will need more due to her almost running out and her itching has yet to stop. Please advise on what to do while patient is still waiting on Xolair.

## 2020-10-20 NOTE — Telephone Encounter (Signed)
It does not sound like the prednisone has helped if she is still having itching/hives while on it.  Thus do not feel more prednisone would be effective or warranted at this time.  With her Allegra the max dosing is 4 tablets a day.  Thus if she is taking 2 tablets she can add an additional 1 to 2 tablets in per day to see if this will help.  Keep Pepcid 1 tab twice a day.  I do not believe she has tried Singulair thus we can add Singulair 10 mg daily to her regimen as well.  Xolair has been improved and if she has not contacted [employer] yet she needs to as they are waiting on the okay from patient/guardian to ship the medication to the home.  It appears that [facility] pharmacy tried calling them and they were not able to reach them.

## 2020-10-21 ENCOUNTER — Ambulatory Visit (INDEPENDENT_AMBULATORY_CARE_PROVIDER_SITE_OTHER): Payer: Medicare HMO

## 2020-10-21 DIAGNOSIS — L501 Idiopathic urticaria: Secondary | ICD-10-CM | POA: Diagnosis not present

## 2020-10-21 MED ORDER — OMALIZUMAB 150 MG/ML ~~LOC~~ SOSY
300.0000 mg | PREFILLED_SYRINGE | SUBCUTANEOUS | Status: DC
  Administered 2020-10-21: 300 mg via SUBCUTANEOUS

## 2020-11-08 ENCOUNTER — Other Ambulatory Visit: Payer: Self-pay

## 2020-11-08 ENCOUNTER — Other Ambulatory Visit: Payer: Self-pay | Admitting: Adult Health

## 2020-11-08 ENCOUNTER — Other Ambulatory Visit: Payer: Self-pay | Admitting: Family Medicine

## 2020-11-08 DIAGNOSIS — I10 Essential (primary) hypertension: Secondary | ICD-10-CM

## 2020-11-08 DIAGNOSIS — G40909 Epilepsy, unspecified, not intractable, without status epilepticus: Secondary | ICD-10-CM

## 2020-11-08 MED ORDER — LEVOCETIRIZINE DIHYDROCHLORIDE 5 MG PO TABS: 5.0000 mg | ORAL_TABLET | Freq: Two times a day (BID) | ORAL | 5 refills | Status: DC

## 2020-11-08 NOTE — Telephone Encounter (Signed)
Pt's guardian called & stated that pt is still itching all over and singular has not been helping.  Please advise.

## 2020-11-08 NOTE — Telephone Encounter (Signed)
Please advise on treatment plan will be helpful for patient.

## 2020-11-08 NOTE — Telephone Encounter (Signed)
Spoke with pts aunt who is guardian and is going to change antihistamine to xyzal so sent in rx and she does want a referral to dermatology

## 2020-11-08 NOTE — Telephone Encounter (Signed)
I think I need to send her to see a dermatologist.  She has been on prolonged prednisone course as well as high dose antihistamines and started on Xolair injections.  It is still too soon to see the effects of Xolair as she's only had 1 injection thus far.   Ensure she is applying moisturizer all over at least once a day to ensure the skin is hydrated. It might be helpful at this time to change her antihistamine from Allegra to a different agent like Xyzal or Zyrtec to see if she has a better response.  She can also continue twice a day Allegra and add in a third dose of Xyzal or Zyrtec to see if this works better than Allegra alone or changing it out completely as above.   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

## 2020-11-08 NOTE — Telephone Encounter (Signed)
Please have patient schedule follow up appointment with PCP as it has been >1 year since last evaluation. 15 day supply provided.

## 2020-11-09 NOTE — Telephone Encounter (Signed)
Called and left voicemail for patient to call back and schedule appointment.  

## 2020-11-09 NOTE — Telephone Encounter (Signed)
Pa was approved for xyzal

## 2020-11-09 NOTE — Telephone Encounter (Signed)
Pa has been sent to insurance thru cover my meds

## 2020-11-11 ENCOUNTER — Other Ambulatory Visit: Payer: Self-pay

## 2020-11-11 NOTE — Telephone Encounter (Signed)
Hey Dr Mauri Reading & Dr Delorse Lek,  I have been trying to figure out this referral for the patient to go to Dermatology, but I have hit a road block & I need some help.   Currently I haven't been able to find any Dermatologist in [place] that will take [employer], which is her secondary insurance.  The only office that I do know of is [facility] but it is located in [place], [place]. The referral also has to come from the Primary Care for them to schedule. It looks like the patient is due back for a yearly follow up with the PCP (Dr Mauri Reading) as they have tried to contact the patient multiple times due to the patient needing refills on her medications.   I contacted the patients Legal Guardian ([name]-Grandmother) to go over this information. She stated that she could get Medicaid transportation to possibly take them to the appointment in [place] if there isn't anywhere else they could go. I informed her that the PCP has also been trying to reach out to them to get scheduled for a follow up visit & their office will have to take over placing this referral due to the patients Secondary Insurance being [facility]. [name] states she is going to give the PCP office a call to get scheduled. I am hoping she will actually give the office a call today so this process can get started ASAP.   I will keep an eye out on this referral to see if the patient gets scheduled with Dr Mauri Reading.   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

## 2020-11-11 NOTE — Telephone Encounter (Signed)
Thanks Dee!

## 2020-11-19 ENCOUNTER — Ambulatory Visit (INDEPENDENT_AMBULATORY_CARE_PROVIDER_SITE_OTHER): Payer: Medicare Other

## 2020-11-19 ENCOUNTER — Other Ambulatory Visit: Payer: Self-pay

## 2020-11-19 DIAGNOSIS — L501 Idiopathic urticaria: Secondary | ICD-10-CM | POA: Diagnosis not present

## 2020-11-19 MED ORDER — OMALIZUMAB 150 MG/ML ~~LOC~~ SOSY
300.0000 mg | PREFILLED_SYRINGE | SUBCUTANEOUS | Status: DC
  Administered 2020-11-19: 300 mg via SUBCUTANEOUS

## 2020-11-21 NOTE — Progress Notes (Signed)
Subjective:   Patient ID: Patricia White    DOB: July 14, 1971, 50 y.o. female   MRN: [id number]  Patricia White is a 50 y.o. female with a history of HTN, congenital left hemiparesis, infantile cerebral palsy, seizure disorder, mandibular lesion, angioedema, moderate intellectual disability, obesity, seasonal allergies  here for check up  HTN:  BP: 132/80 today. Currently on Indapamide 1.41m QD. Endorses compliance. Non-smoker. Denies any chest pain, SOB, vision changes, or headaches.   Lab Results  Component Value Date   CREATININE 1.04 (H) 11/23/2020   CREATININE 0.86 08/25/2020   CREATININE 0.80 01/19/2020    HLD: Last lipid panel below. Not currently on any lipid lowering medications. Endorses compliance. Denies any muscles aches or weakness. The 10-year ASCVD risk score (Mikey BussingDC Jr., et al., 2013) is: 8.7%   Lab Results  Component Value Date   CHOL 220 (H) 11/23/2020   HDL 52 11/23/2020   LDLCALC 121 (H) 11/23/2020   TRIG 272 (H) 11/23/2020   CHOLHDL 4.2 11/23/2020   Social History: LMP: ~6 months, perimenopausal per guardian Contraception: abstinenet Alcohol: none Tobacco: none Illicit Drugs: none Safe at home: yes Depression/Suicidality: no Exercise: "not really"  Health Maintenance: Health Maintenance Due  Topic  . PNEUMOCOCCAL POLYSACCHARIDE VACCINE AGE 26-64 HIGH RISK   . FOOT EXAM   . OPHTHALMOLOGY EXAM   . URINE MICROALBUMIN   . PAP SMEAR-Modifier   . COVID-19 Vaccine (2 - Pfizer 3-dose series)   Review of Systems:  Per HPI.   Objective:   BP 132/80   Pulse (!) 104   Ht _0  (1.575 m)   Wt 170 lb 2 oz (77.2 kg)   SpO2 97%   BMI 31.12 kg/m  Vitals and nursing note reviewed.  General: pleasant older woman, sitting comfortably in exam chair with guardian at her side, well nourished, well developed, in no acute distress with non-toxic appearance HEENT: normocephalic, atraumatic, moist mucous membranes, oropharynx clear without erythema or  exudate, impacted cerumen bilaterally, poor dentition Neck: supple, non-tender without lymphadenopathy, no thyromegaly CV: regular rate and rhythm without murmurs, rubs, or gallops Lungs: clear to auscultation bilaterally with normal work of breathing on room air Abdomen: soft, non-tender, non-distended, normoactive bowel sounds Skin: warm, dry, significant maculopapular rash on left arm Extremities: warm and well perfused, normal tone MSK: gait normal Neuro: follows commands, does not speak when spoken to  Assessment & Plan:   Essential hypertension Chronic. Well controlled with Indapamide 1.271mQD.  - BMP today to monitor electrolytes - Discussed starting daily Potassium supplement - start 208mdaily - repeat BMP in 2 weeks to monitor   Diabetes mellitus without complication ([place] diagnosis. A1C 8.9 today. Glucose on BMP 460. Anion gap of 19.  Incidental finding. Patient appeared well on exam. Guardian denies any symptoms such as polyuria, polydipsia, nausea, vomiting. Tolerating PO well. Does note increased sleepiness since starting Xyzal.  [facility]. Likely benefit from long acting (Lantus 10U daily or equivalent).   Recommended initiation of Metformin 500m70mD with plan to increase to 1000mg30m in 1 week if tolerated.  Guardian is a diabetic and aware of insulin therapy and glucometer use. Recommended checking blood sugar 2x/day and keep a log. Glucometer supplies sent to pharmacy. Repeat CMP and UA tomorrow - future orders in place and lab visit scheduled Follow up with Dr. MulliTarry Kos/15/22 Elevated cholesterol - would benefit from statin therapy in the future.  Nonsmoker Eventually benefit from ACE/ARB for renal protection   Hypokalemia K 2.9 in February. Appears to have been low for several occasions. Likely 2/2 to indapamide.  BMP today notable for K of 3.0 Start Potassium supplement 86mq daily  BMP in 2  weeks to monitor   Hyperlipidemia associated with type 2 diabetes mellitus ([place] Elevated today.  The 10-year ASCVD risk score (Mikey BussingDC Jr., et al., 2013) is: 8.7% Would benefit from mod-high intensity statin. Will discuss at follow up on Tuesday.  Poor Dentition: Follows with dentist per guardian. Encouraged to follow up with dentist for regular visits Impacted Cerumen: Attempted irrigation with some improvement. Recommended continued use of Debrox at home. Follow up for repeat irrigation as needed  Health Maintenance: - referred to gyn for pap-smear given need for sedation for procedure. Emphasized importance of guardian answering phone when office calls. Confirmed accurate phone numbers. - Hep C screening obtained today  Orders Placed This Encounter  Procedures  . Lipid Panel  . Hepatitis C antibody  . Basic Metabolic Panel  . Basic Metabolic Panel    Standing Status:   Future    Standing Expiration Date:   11/23/2021  . Comprehensive metabolic panel    Standing Status:   Future    Standing Expiration Date:   11/24/2021  . Ambulatory referral to Dermatology    Referral Priority:   Routine    Referral Type:   Consultation    Referral Reason:   Specialty Services Required    Requested Specialty:   Dermatology    Number of Visits Requested:   1  . Ambulatory referral to Gynecology    Referral Priority:   Routine    Referral Type:   Consultation    Referral Reason:   Specialty Services Required    Requested Specialty:   Gynecology    Number of Visits Requested:   1  . Amb ref to Medical Nutrition Therapy-MNT    Referral Priority:   Routine    Referral Type:   Consultation    Referral Reason:   Specialty Services Required    Requested Specialty:   Nutrition    Number of Visits Requested:   1  . POCT glycosylated hemoglobin (Hb A1C)  . POCT urinalysis dipstick    Standing Status:   Future    Standing Expiration Date:   12/24/2020   Meds ordered this encounter  Medications   . potassium chloride (KLOR-CON) 20 MEQ packet    Sig: Take 20 mEq by mouth daily.    Dispense:  100 each    Refill:  1  . metFORMIN (GLUCOPHAGE-XR) 500 MG 24 hr tablet    Sig: Take 1 tablet (500 mg total) by mouth 2 (two) times daily with a meal. Increase to 2 tablets (10060mtotal) twice a day with a meal after 1 week.    Dispense:  180 tablet    Refill:  3  . Lancets Misc. (ACCU-CHEK FASTCLIX LANCET) KIT    Sig: 1 kit by Does not apply route as directed.    Dispense:  1 kit    Refill:  1  . Accu-Chek FastClix Lancets MISC    Sig: 1 Device by Does not apply route as directed.    Dispense:  102 each    Refill:  1  . Blood Glucose Monitoring Suppl (ACCU-CHEK GUIDE) w/Device KIT    Sig: 1 each by Does not apply route as directed.    Dispense:  1 kit  Refill:  1  . glucose blood (ACCU-CHEK GUIDE) test strip    Sig: Use as instructed    Dispense:  100 each    Refill:  [place], DO PGY-3, [facility] 11/24/2020 6:20 PM

## 2020-11-23 ENCOUNTER — Other Ambulatory Visit: Payer: Self-pay

## 2020-11-23 ENCOUNTER — Encounter: Payer: Self-pay | Admitting: Family Medicine

## 2020-11-23 ENCOUNTER — Ambulatory Visit (INDEPENDENT_AMBULATORY_CARE_PROVIDER_SITE_OTHER): Payer: Medicare Other | Admitting: Family Medicine

## 2020-11-23 VITALS — BP 132/80 | HR 104 | Ht 62.0 in | Wt 170.1 lb

## 2020-11-23 DIAGNOSIS — Z124 Encounter for screening for malignant neoplasm of cervix: Secondary | ICD-10-CM

## 2020-11-23 DIAGNOSIS — E876 Hypokalemia: Secondary | ICD-10-CM | POA: Diagnosis not present

## 2020-11-23 DIAGNOSIS — I10 Essential (primary) hypertension: Secondary | ICD-10-CM

## 2020-11-23 DIAGNOSIS — E785 Hyperlipidemia, unspecified: Secondary | ICD-10-CM

## 2020-11-23 DIAGNOSIS — Z1159 Encounter for screening for other viral diseases: Secondary | ICD-10-CM

## 2020-11-23 DIAGNOSIS — Z131 Encounter for screening for diabetes mellitus: Secondary | ICD-10-CM

## 2020-11-23 DIAGNOSIS — Z13220 Encounter for screening for lipoid disorders: Secondary | ICD-10-CM

## 2020-11-23 DIAGNOSIS — E1169 Type 2 diabetes mellitus with other specified complication: Secondary | ICD-10-CM

## 2020-11-23 DIAGNOSIS — E119 Type 2 diabetes mellitus without complications: Secondary | ICD-10-CM

## 2020-11-23 DIAGNOSIS — J302 Other seasonal allergic rhinitis: Secondary | ICD-10-CM

## 2020-11-23 LAB — POCT GLYCOSYLATED HEMOGLOBIN (HGB A1C): HbA1c, POC (controlled diabetic range): 8.9 % — AB (ref 0.0–7.0)

## 2020-11-23 NOTE — Assessment & Plan Note (Addendum)
K 2.9 in February. Appears to have been low for several occasions. Likely 2/2 to indapamide.  BMP today notable for K of 3.0 Start Potassium supplement daily  BMP in 2 weeks to monitor

## 2020-11-23 NOTE — Assessment & Plan Note (Signed)
Chronic. Well controlled with Indapamide 1.25mg  QD.  - BMP today to monitor electrolytes - Discussed starting daily Potassium supplement - start daily - repeat BMP in 2 weeks to monitor

## 2020-11-23 NOTE — Patient Instructions (Addendum)
It was a pleasure to see you today!  Thank you for choosing [facility] for your primary care.   Our plans for today were:  I am checking labs today. We will likely need to start a potassium supplement to take with her blood pressure medicine. I will call this in to the pharmacy once I get the results.  If we start the potassium, she will need to return in 2 weeks for repeat labs. Please call to schedule this.  I have placed referral to Gyn and Derm. PLEASE PLEASE PLEASE expect a call from them to schedule.    We are checking some labs today, I will call you if they are abnormal will send you a MyChart message or a letter if they are normal.  If you do not hear about your labs in the next 2 weeks please let us know.  BRING ALL OF YOUR MEDICATIONS WITH YOU TO EVERY VISIT   You should return to our clinic in 2 weeks for repeat labs  Best Wishes,   Orpah Cobb, DO

## 2020-11-23 NOTE — Assessment & Plan Note (Addendum)
New diagnosis. A1C 8.9 today. Glucose on BMP 460. Anion gap of 19.  Incidental finding. Patient appeared well on exam. Guardian denies any symptoms such as polyuria, polydipsia, nausea, vomiting. Tolerating PO well. Does note increased sleepiness since starting Xyzal.  Recommended follow up to [facility] tomorrow for initiation of insulin therapy. Likely benefit from long acting (Lantus 10U daily or equivalent).   Recommended initiation of Metformin 500mg  BID with plan to increase to 1000mg  BID in 1 week if tolerated.  Guardian is a diabetic and aware of insulin therapy and glucometer use. Recommended checking blood sugar 2x/day and keep a log. Glucometer supplies sent to pharmacy. Repeat CMP and UA tomorrow - future orders in place and lab visit scheduled Follow up with Dr. on 11/26/20 Elevated cholesterol - would benefit from statin therapy in the future. Nonsmoker Eventually benefit from ACE/ARB for renal protection

## 2020-11-24 ENCOUNTER — Telehealth: Payer: Self-pay | Admitting: Family Medicine

## 2020-11-24 DIAGNOSIS — E1169 Type 2 diabetes mellitus with other specified complication: Secondary | ICD-10-CM | POA: Insufficient documentation

## 2020-11-24 LAB — LIPID PANEL
Chol/HDL Ratio: 4.2 ratio (ref 0.0–4.4)
Cholesterol, Total: 220 mg/dL — ABNORMAL HIGH (ref 100–199)
HDL: 52 mg/dL (ref >39)
LDL Chol Calc (NIH): 121 mg/dL — ABNORMAL HIGH (ref 0–99)
Triglycerides: 272 mg/dL — ABNORMAL HIGH (ref 0–149)
VLDL Cholesterol Cal: 47 mg/dL — ABNORMAL HIGH (ref 5–40)

## 2020-11-24 LAB — BASIC METABOLIC PANEL
BUN/Creatinine Ratio: 10 (ref 9–23)
BUN: 10 mg/dL (ref 6–24)
CO2: 26 mmol/L (ref 20–29)
Calcium: 9.6 mg/dL (ref 8.7–10.2)
Chloride: 89 mmol/L — ABNORMAL LOW (ref 96–106)
Creatinine, Ser: 1.04 mg/dL — ABNORMAL HIGH (ref 0.57–1.00)
Glucose: 459 mg/dL — ABNORMAL HIGH (ref 65–99)
Potassium: 3 mmol/L — ABNORMAL LOW (ref 3.5–5.2)
Sodium: 134 mmol/L (ref 134–144)
eGFR: 67 mL/min/{1.73_m2} (ref >59)

## 2020-11-24 LAB — HEPATITIS C ANTIBODY: Hep C Virus Ab: <0.1 s/co ratio (ref 0.0–0.9)

## 2020-11-24 MED ORDER — ACCU-CHEK FASTCLIX LANCET KIT: 1.0000 | PACK | 1 refills | Status: DC

## 2020-11-24 MED ORDER — POTASSIUM CHLORIDE 20 MEQ PO PACK: 20.0000 meq | PACK | Freq: Every day | ORAL | 1 refills | Status: DC

## 2020-11-24 MED ORDER — ACCU-CHEK GUIDE VI STRP: ORAL_STRIP | 12 refills | Status: DC

## 2020-11-24 MED ORDER — ACCU-CHEK FASTCLIX LANCETS MISC: 1.0000 | 1 refills | Status: DC

## 2020-11-24 MED ORDER — ACCU-CHEK GUIDE W/DEVICE KIT: 1.0000 | PACK | 1 refills | Status: DC

## 2020-11-24 MED ORDER — METFORMIN HCL ER 500 MG PO TB24: 500.0000 mg | ORAL_TABLET | Freq: Two times a day (BID) | ORAL | 3 refills | Status: DC

## 2020-11-24 NOTE — Telephone Encounter (Signed)
Spoke to guardian Elane Peabody) regarding new diagnosis of diabetes with elevated blood sugar of 460 on BMP. She also has an anion gap of 19. Patient is primarily nonverbal thus history is obtained from guardian. When asked, guardian denies any polyuria, polydipsia, polyphagia, abdominal pain, nausea, or vomiting. She has been more sleepy lately however thought this was 2/2 to new medicine Xyzal in combination of her seizure medications. During telephone encounter, patient was alert and followed commands. She was able to drink fluids. CBG at time of call was 367.   Diabetes:   Recommended initiation of insulin therapy due to level of elevated blood sugars in addition to starting Metformin.   Scheduled with Dr. Raymondo Band tomorrow morning for initiation of insulin therapy. Guardian is a diabetic thus already has baseline understanding of insulin therapy and CBG monitoring.   Called in Metformin 500mg  BID with plan to increase to 1000mg  in 1 week  I have also scheduled her for labs tomorrow: CMP and UA  She is scheduled for follow up with me on Tuesday, 11/26/20.  Hypokalemia:  She was noted to have hypokalemia likely from indapamide with possible contribution from hyperglycemia as well  Start Potassium supplement daily  Likely benefit from transition to ACE/ARB in future. Will plan to discuss this at later time.  Strict ED precautions discussed at length. Guardian voiced understanding and agreement with plan.

## 2020-11-24 NOTE — Assessment & Plan Note (Signed)
Elevated today.  The 10-year ASCVD risk score Denman George DC Jr., et al., 2013) is: 8.7% Would benefit from mod-high intensity statin. Will discuss at follow up on Tuesday.

## 2020-11-25 ENCOUNTER — Other Ambulatory Visit: Payer: Self-pay

## 2020-11-25 ENCOUNTER — Other Ambulatory Visit (INDEPENDENT_AMBULATORY_CARE_PROVIDER_SITE_OTHER): Payer: Medicare Other

## 2020-11-25 ENCOUNTER — Encounter: Payer: Self-pay | Admitting: Family Medicine

## 2020-11-25 ENCOUNTER — Other Ambulatory Visit: Payer: Self-pay | Admitting: Pharmacist

## 2020-11-25 ENCOUNTER — Ambulatory Visit: Payer: Medicare Other | Admitting: Pharmacist

## 2020-11-25 DIAGNOSIS — I10 Essential (primary) hypertension: Secondary | ICD-10-CM

## 2020-11-25 DIAGNOSIS — E119 Type 2 diabetes mellitus without complications: Secondary | ICD-10-CM | POA: Diagnosis not present

## 2020-11-25 DIAGNOSIS — E876 Hypokalemia: Secondary | ICD-10-CM

## 2020-11-25 LAB — POCT URINALYSIS DIP (MANUAL ENTRY)
Bilirubin, UA: NEGATIVE
Blood, UA: NEGATIVE
Glucose, UA: >=1000 mg/dL — AB
Ketones, POC UA: NEGATIVE mg/dL
Leukocytes, UA: NEGATIVE
Nitrite, UA: NEGATIVE
Protein Ur, POC: NEGATIVE mg/dL
Spec Grav, UA: 1.015 (ref 1.010–1.025)
Urobilinogen, UA: 0.2 E.U./dL
pH, UA: 6.5 (ref 5.0–8.0)

## 2020-11-25 NOTE — Telephone Encounter (Signed)
Just wanted to let you know that referral was placed and numbers in chart were updated to ensure better communication with guardian. Let me know if there is any other way I can help.

## 2020-11-26 LAB — COMPREHENSIVE METABOLIC PANEL
ALT: 19 IU/L (ref 0–32)
AST: 27 IU/L (ref 0–40)
Albumin/Globulin Ratio: 0.9 — ABNORMAL LOW (ref 1.2–2.2)
Albumin: 4.1 g/dL (ref 3.8–4.8)
Alkaline Phosphatase: 124 IU/L — ABNORMAL HIGH (ref 44–121)
BUN/Creatinine Ratio: 10 (ref 9–23)
BUN: 9 mg/dL (ref 6–24)
Bilirubin Total: 0.3 mg/dL (ref 0.0–1.2)
CO2: 27 mmol/L (ref 20–29)
Calcium: 9.2 mg/dL (ref 8.7–10.2)
Chloride: 88 mmol/L — ABNORMAL LOW (ref 96–106)
Creatinine, Ser: 0.91 mg/dL (ref 0.57–1.00)
Globulin, Total: 4.6 g/dL — ABNORMAL HIGH (ref 1.5–4.5)
Glucose: 454 mg/dL — ABNORMAL HIGH (ref 65–99)
Potassium: 3 mmol/L — ABNORMAL LOW (ref 3.5–5.2)
Sodium: 133 mmol/L — ABNORMAL LOW (ref 134–144)
Total Protein: 8.7 g/dL — ABNORMAL HIGH (ref 6.0–8.5)
eGFR: 78 mL/min/{1.73_m2} (ref >59)

## 2020-11-29 ENCOUNTER — Other Ambulatory Visit: Payer: Self-pay | Admitting: Family Medicine

## 2020-11-29 ENCOUNTER — Other Ambulatory Visit: Payer: Self-pay

## 2020-11-29 ENCOUNTER — Encounter: Payer: Self-pay | Admitting: Pharmacist

## 2020-11-29 ENCOUNTER — Ambulatory Visit (INDEPENDENT_AMBULATORY_CARE_PROVIDER_SITE_OTHER): Payer: Medicare Other | Admitting: Pharmacist

## 2020-11-29 VITALS — BP 132/78 | HR 92 | Ht 62.0 in | Wt 169.0 lb

## 2020-11-29 DIAGNOSIS — E1169 Type 2 diabetes mellitus with other specified complication: Secondary | ICD-10-CM | POA: Diagnosis not present

## 2020-11-29 DIAGNOSIS — I10 Essential (primary) hypertension: Secondary | ICD-10-CM

## 2020-11-29 DIAGNOSIS — E876 Hypokalemia: Secondary | ICD-10-CM

## 2020-11-29 DIAGNOSIS — E785 Hyperlipidemia, unspecified: Secondary | ICD-10-CM

## 2020-11-29 DIAGNOSIS — E119 Type 2 diabetes mellitus without complications: Secondary | ICD-10-CM

## 2020-11-29 MED ORDER — DAPAGLIFLOZIN PROPANEDIOL 5 MG PO TABS: 5.0000 mg | ORAL_TABLET | Freq: Every day | ORAL | 2 refills | Status: DC

## 2020-11-29 MED ORDER — POTASSIUM CHLORIDE CRYS ER 20 MEQ PO TBCR: 20.0000 meq | EXTENDED_RELEASE_TABLET | Freq: Every day | ORAL | 0 refills | Status: DC

## 2020-11-29 NOTE — Telephone Encounter (Signed)
Patient is scheduled to see Dr Gwen Pounds on 06/06/2021. Patient has been placed on a cancellation list with their office.

## 2020-11-29 NOTE — Assessment & Plan Note (Signed)
Hypokalemia - Patient currently taking potassium supplementation via powder. Caregiver reports patient does not like taste and requests tablets, which patient has had in past with no issues. Will send in prescription for tablets and discontinue packet. 1. Switch potassium packet to potassium tablet. Sent in 30 day supply to allow provider to further manage and make changes she deems necessary.

## 2020-11-29 NOTE — Progress Notes (Signed)
Subjective:    Patient ID: Patricia White, female    DOB: May 10, 1971, 50 y.o.   MRN: [id number]  HPI Patient is a 50 y.o. female who presents for diabetes management. She is in good spirits and presents with assistance from caregiver (aunt). Patient nonverbal and all information from appt provided by caregiver. Patient was referred and last seen by Primary Care Provider on 11/23/20.  Caregiver reports that she has not begun insulin therapy as she wanted to allow time for the metformin to work and felt patient didn't need insulin quite yet.  Caregiver reports diabetes was diagnosed recently.   Insurance coverage/medication affordability: Medicare/Medicaid  Family/Social history: lives with caregiver (aunt)  Current diabetes medications include: metformin 500mg  BID Current hypertension medications include: indapamide 1.25mg  Current hyperlipidemia medications include: N/A Caregiver states that She is taking her diabetes medications as prescribed. Caregiver reports adherence with medications. Caregiver states that patient does not miss her medications.  Does you feel that your medications are working for you?  yes  Have you been experiencing any side effects to the medications prescribed? no  Do you have any problems obtaining medications due to transportation or finances?  no     Patient reported dietary habits:  Eats 3 meals/day and will snack "whenever else she is hungry"  Breakfast: cereal (rice krispies) w/ milk Lunch: healthy choice frozen meals Dinner: healthy choice frozen meals Snacks:chips Drinks: previously was consuming regular soda but caregiver states she is working on switching her to diet or zero Since recent diagnosis caregiver states that she is working on improving patient's diet to reduce sugar and carbohydrates.  Patient-reported exercise habits: N/A   Caregiver denies hypoglycemic events. Caregiver denies polyuria (increased urination).  Caregiver denies  polyphagia (increased appetite).  Caregiver denies polydipsia (increased thirst).  Patient reports neuropathy (nerve pain). Caregiver denies visual changes. Caregiver reports foot exams.   Blood glucose reported from glucometer: 200-300's (fasting and postprandial/random); highest: 391, lowest: 214   Objective:   Labs:   Lab Results  Component Value Date   HGBA1C 8.9 (A) 11/23/2020    Vitals:   11/29/20 1138  BP: 132/78  Pulse: 92  SpO2: 98%    No results found for: MICRALBCREAT  Lipid Panel     Component Value Date/Time   CHOL 220 (H) 11/23/2020 1156   TRIG 272 (H) 11/23/2020 1156   HDL 52 11/23/2020 1156   CHOLHDL 4.2 11/23/2020 1156   CHOLHDL 2.7 Ratio 10/19/2009 1830   VLDL 17 10/19/2009 1830   LDLCALC 121 (H) 11/23/2020 1156    Clinical Atherosclerotic Cardiovascular Disease (ASCVD): No  The 10-year ASCVD risk score 01/23/2021 DC Jr., et al., 2013) is: 8.7%   Values used to calculate the score:     Age: 62 years     Sex: Female     Is Non-Hispanic African American: Yes     Diabetic: Yes     Tobacco smoker: No     Systolic Blood Pressure: 132 mmHg     Is BP treated: Yes     HDL Cholesterol: 52 mg/dL     Total Cholesterol: 220 mg/dL   PHQ-9 Score: 0  Assessment/Plan:   T2DM is not controlled likely due to recent diagnosis and further lifestyle and pharmacotherapy management changes needed. Medication adherence appears optimal. Additional pharmacotherapy is warranted due to continued elevated blood glucose despite initiation of metformin.  Will initiate SGLT-2 to keep regimen simplified for caregiver. Patient educated on purpose, proper use and potential adverse effects  of dapagliflozin Marcelline Deist).  Following instruction patient verbalized understanding of treatment plan.    1. Started SGLT2-I dapagliflozin Marcelline Deist) 5mg  once daily Counseled on sick day rules. 2. Extensively discussed pathophysiology of diabetes, dietary effects on blood sugar control, and  recommended lifestyle interventions. 3. Patient will adhere to dietary modifications 4. Counseled on s/sx of and management of hypoglycemia 5. Next A1C anticipated 02/22/21.   ASCVD risk - primary prevention in patient with diabetes. Last LDL is not controlled. ASCVD risk score is not >20%  - moderate intensity statin indicated. Dr. 04/25/21 has plans to discuss statin initiation with patient and caregiver.  1. Recommendation is initiation of rosuvastatin 10mg  once daily.  Hypertension currently slightly above goal but mostly controlled.  Blood pressure goal <130/80 mmHg. Medication adherence appears optimal. Dr. Mauri Reading has plans to discuss change in blood pressure therapy. 1. Recommendation for ACE/ARB therapy  Hypokalemia - Patient currently taking potassium supplementation via powder. Caregiver reports patient does not like taste and requests tablets, which patient has had in past with no issues. Will send in prescription for tablets and discontinue packet. 1. Switch potassium packet to potassium Mauri Reading tablet. Sent in 30 day supply to allow provider to further manage and make changes she deems necessary.  Patient has appt with provider in one week. Will send message to provider with option for continued pharmacy follow-up if she deems necessary. Will reach out based on provider preference. Written patient instructions provided.  This appointment required 70 minutes of patient care (this includes precharting, chart review, review of results, and face-to-face care).  Thank you for involving pharmacy to assist in providing this patient's care.  Patient seen with , PharmD Candidate, and , PharmD, PGY-1 Pharmacy Resident

## 2020-11-29 NOTE — Assessment & Plan Note (Signed)
T2DM is not controlled likely due to recent diagnosis and further lifestyle and pharmacotherapy management changes needed. Medication adherence appears optimal. Additional pharmacotherapy is warranted due to continued elevated blood glucose despite initiation of metformin.  Will initiate SGLT-2 to keep regimen simplified for caregiver. Patient educated on purpose, proper use and potential adverse effects of dapagliflozin Marcelline Deist).  Following instruction patient verbalized understanding of treatment plan.    1. Started SGLT2-I dapagliflozin Marcelline Deist) 5mg  once daily Counseled on sick day rules. 2. Extensively discussed pathophysiology of diabetes, dietary effects on blood sugar control, and recommended lifestyle interventions. 3. Patient will adhere to dietary modifications 4. Counseled on s/sx of and management of hypoglycemia 5. Next A1C anticipated 02/22/21.

## 2020-11-29 NOTE — Patient Instructions (Signed)
Ms. Yglesias it was a pleasure seeing you today.   Please do the following:  1. Increase metformin to 2 tablets twice a day starting on Friday as directed today during your appointment. If you have any questions or if you believe something has occurred because of this change, call me or your doctor to let one of [place] know.  2. Start Marcelline Deist 5mg  once daily as directed today during your appointment. If you have any questions or if you believe something has occurred because of this change, call me or your doctor to let one of know.  3. Switching potassium from powder to tablet.  4. Continue checking blood sugars at home. It's really important that you record these and bring these in to your next doctor's appointment.  5. Continue making the lifestyle changes we've discussed together during our visit. Diet and exercise play a significant role in improving your blood sugars.  6. Follow-up with PCP at next appt on 12/07/20.   Hypoglycemia or low blood sugar:   Low blood sugar can happen quickly and may become an emergency if not treated right away.   While this shouldn't happen often, it can be brought upon if you skip a meal or do not eat enough. Also, if your insulin or other diabetes medications are dosed too high, this can cause your blood sugar to go to low.   Warning signs of low blood sugar include: 1. Feeling shaky or dizzy 2. Feeling weak or tired  3. Excessive hunger 4. Feeling anxious or upset  5. Sweating even when you aren't exercising  What to do if I experience low blood sugar? Follow the Rule of 15 1. Check your blood sugar with your meter. If lower than 70, proceed to step 2.  2. Treat with 15 grams of fast acting carbs which is found in 3-4 glucose tablets. If none are available you can try hard candy, 1 tablespoon of sugar or honey,4 ounces of fruit juice, or 6 ounces of REGULAR soda.  3. Re-check your sugar in 15 minutes. If it is still below 70, do what you did in step 2  again. If your blood sugar has come back up, go ahead and eat a snack or small meal made up of complex carbs (ex. Whole grains) and protein at this time to avoid recurrence of low blood sugar.

## 2020-11-29 NOTE — Assessment & Plan Note (Signed)
ASCVD risk - primary prevention in patient with diabetes. Last LDL is not controlled. ASCVD risk score is not >20%  - moderate intensity statin indicated. Dr. Mauri Reading has plans to discuss statin initiation with patient and caregiver.  1. Recommendation is initiation of rosuvastatin 10mg  once daily.

## 2020-11-29 NOTE — Assessment & Plan Note (Signed)
Hypertension currently slightly above goal but mostly controlled.  Blood pressure goal <130/80 mmHg. Medication adherence appears optimal. Dr. Mauri Reading has plans to discuss change in blood pressure therapy. 1. Recommendation for ACE/ARB therapy

## 2020-12-02 ENCOUNTER — Telehealth: Payer: Self-pay | Admitting: *Deleted

## 2020-12-02 ENCOUNTER — Encounter: Payer: Self-pay | Admitting: Family Medicine

## 2020-12-02 NOTE — Telephone Encounter (Signed)
Please inform caregiver I have created letter and placed up front to be faxed. I am so sorry for this inconvenience.

## 2020-12-02 NOTE — Telephone Encounter (Signed)
Called patient's caregiver/aunt Patricia White to inquire as to whether the fax number on file for the [employer] is correct.  I have attempted to fax the letter 3 times and it returns with a communication error each time.  Not sure if the machine is on at the destination  is off or malfunctioning.  Will await call from caregiver to proceed.  Glennie Hawk, CMA

## 2020-12-02 NOTE — Telephone Encounter (Signed)
Pts aunt, Laia Wiley, calls because she needs a letter fax to transportation.  They will not allow her to accompany patient to provider appts without a letter that states the following:  Please allow Shoni Quijas aunt/caregiver to ride medical transportation with her.  She accompanies Joslin to all providers visit.   This will need to be faxed to [phone].  To PCP for next steps.  Jone Baseman, CMA

## 2020-12-03 NOTE — Telephone Encounter (Signed)
Called and spoke to patient's caregiver Ilyssa Grennan.  She states that she will call Social Services and let [facility] know the correct number for faxing to letter.  She is not certain that picking up the letter and taking it to them will work as I suggested to her.  Will await for her call.  Glennie Hawk, CMA'

## 2020-12-03 NOTE — Telephone Encounter (Signed)
Spoke with [employer].  There was some confusion with transportation and now they are saying that she does not need the letter.   She will still pick up and keep the letter with her in case there are issues in the future. Jone Baseman, CMA

## 2020-12-05 NOTE — Progress Notes (Signed)
Subjective:   Patient ID: Patricia White    DOB: 12/09/1970, 50 y.o. female   MRN: [id number]  Patricia White is a 50 y.o. female with a history of hypertension, diabetes, hyperlipidemia, congenital left hemiparesis, infantile cerebral palsy, seizure disorder, angioedema, hypokalemia, moderate intellectual disability, obesity, seasonal allergies here for diabetes follow-up  Diabetes: Last three A1C's below. Currently on Farxiga 5mg  and Metformin 500 twice daily. Endorses compliance. Notes CBGs range 120-190. This AM 142. Few in 230's.. Denies any hypoglycemia. Denies any polyuria, polydipsia, polyphagia. Due for diabetic foot exam and eye exam  Lab Results  Component Value Date   HGBA1C 8.9 (A) 11/23/2020   HTN:  BP: 100/72 today. Currently on Indapamide 1.25mg  QD with potassium. Endorses compliance. Non-smoker. Denies any chest pain, SOB, vision changes, or headaches.   Lab Results  Component Value Date   CREATININE 0.91 11/25/2020   CREATININE 1.04 (H) 11/23/2020   CREATININE 0.86 08/25/2020   HLD: Last lipid panel below. Not currently on any lipid lowering medications. Endorses compliance. Denies any muscles aches or weakness. The 10-year ASCVD risk score 10/23/2020 DC Jr., et al., 2013) is: 2.6%   Lab Results  Component Value Date   CHOL 220 (H) 11/23/2020   HDL 52 11/23/2020   LDLCALC 121 (H) 11/23/2020   TRIG 272 (H) 11/23/2020   CHOLHDL 4.2 11/23/2020   Health Maintenance: Health Maintenance Due  Topic  . PNEUMOCOCCAL POLYSACCHARIDE VACCINE AGE 52-64 HIGH RISK   . FOOT EXAM   . OPHTHALMOLOGY EXAM   . URINE MICROALBUMIN   . PAP SMEAR-Modifier   . COVID-19 Vaccine (2 - Pfizer 3-dose series)   No family history of breast or colon cancer to suggest early screening.  Review of Systems:  Per HPI.   Objective:   BP 100/72   Pulse 92   Ht 5\' 2"  (1.575 m)   Wt 166 lb (75.3 kg)   SpO2 97%   BMI 30.36 kg/m  Vitals and nursing note reviewed.  General: well  appearing older female, sitting comfortably in exam chair, well nourished, well developed, in no acute distress with non-toxic appearance Resp: breathing comfortably on room air, speaking in full sentences Skin: warm, dry  Extremities: warm and well perfused, normal tone MSK:  gait normal  Assessment & Plan:   Hyperlipidemia associated with type 2 diabetes mellitus (HCC) New problem. In setting of new diagnosis of DM. The 10-year ASCVD risk score 01/23/2021 DC Jr., et al., 2013) is: 2.6% Start Atorvastatin 40mg  QD  Essential hypertension Chronic. Normotensive. Nonsmoker Transition from Indapamide/potassium to Losartan 25mg  QD for kidney protection in setting of DM Follow up 2 weeks for repeat BMP and BP check  Diabetes mellitus without complication (HCC) New problem. A1C 8.9. CBG much improved.  - increase Metformin 1000mg  BID - continue Farxiga - Follow up 2 weeks - increase farxiga to 10mg  if still has elevated BS at follow up - can consider addition of GLP-1 in future if better control is needed  Hypokalemia In setting of indapamide.  Recently started on potassium supplement.  BMP today to monitor.  Plan to transition off of indapamide/potassium to losartan for kidney protection in setting of diabetes.  We will plan to repeat BMP at follow-up visit  Orders Placed This Encounter  Procedures  . Basic Metabolic Panel   Meds ordered this encounter  Medications  . atorvastatin (LIPITOR) 40 MG tablet    Sig: Take 1 tablet (40 mg total) by mouth daily.  Dispense:  90 tablet    Refill:  3  . losartan (COZAAR) 25 MG tablet    Sig: Take 1 tablet (25 mg total) by mouth at bedtime.    Dispense:  90 tablet    Refill:  3  . metFORMIN (GLUCOPHAGE-XR) 500 MG 24 hr tablet    Sig: Take 2 tablets (1,000 mg total) by mouth 2 (two) times daily with a meal.    Dispense:  180 tablet    Refill:  3      Orpah Cobb, DO PGY-3, [facility] 12/07/2020 1:18 PM

## 2020-12-06 ENCOUNTER — Encounter: Payer: Self-pay | Admitting: *Deleted

## 2020-12-07 ENCOUNTER — Ambulatory Visit (INDEPENDENT_AMBULATORY_CARE_PROVIDER_SITE_OTHER): Payer: Medicare Other | Admitting: Family Medicine

## 2020-12-07 ENCOUNTER — Other Ambulatory Visit: Payer: Self-pay

## 2020-12-07 ENCOUNTER — Encounter: Payer: Self-pay | Admitting: Family Medicine

## 2020-12-07 VITALS — BP 100/72 | HR 92 | Ht 62.0 in | Wt 166.0 lb

## 2020-12-07 DIAGNOSIS — E1169 Type 2 diabetes mellitus with other specified complication: Secondary | ICD-10-CM | POA: Diagnosis not present

## 2020-12-07 DIAGNOSIS — E119 Type 2 diabetes mellitus without complications: Secondary | ICD-10-CM | POA: Diagnosis not present

## 2020-12-07 DIAGNOSIS — E785 Hyperlipidemia, unspecified: Secondary | ICD-10-CM

## 2020-12-07 DIAGNOSIS — E876 Hypokalemia: Secondary | ICD-10-CM | POA: Diagnosis not present

## 2020-12-07 DIAGNOSIS — I10 Essential (primary) hypertension: Secondary | ICD-10-CM | POA: Diagnosis not present

## 2020-12-07 MED ORDER — ATORVASTATIN CALCIUM 40 MG PO TABS: 40.0000 mg | ORAL_TABLET | Freq: Every day | ORAL | 3 refills | Status: DC

## 2020-12-07 MED ORDER — LOSARTAN POTASSIUM 25 MG PO TABS: 25.0000 mg | ORAL_TABLET | Freq: Every day | ORAL | 3 refills | Status: DC

## 2020-12-07 MED ORDER — METFORMIN HCL ER 500 MG PO TB24: 1000.0000 mg | ORAL_TABLET | Freq: Two times a day (BID) | ORAL | 3 refills | Status: DC

## 2020-12-07 NOTE — Patient Instructions (Addendum)
Please call to schedule:  [facility] [phone]   Diabetes:   Metformin 500mg : Take 2 tablets (1000mg ) twice a day   Farxiga 5mg : take 1 tablet daily  Cholesterol:   Start Atorvastatin 40mg : 1 tablet daily  Blood pressure:  STOP Indapamide 1.25mg  daily and Potassium  Start Losartan 25mg : 1 tablet daily   Continue to check blood sugars daily and follow up on 12/23/20 at 11:10am

## 2020-12-07 NOTE — Assessment & Plan Note (Signed)
New problem. In setting of new diagnosis of DM. The 10-year ASCVD risk score Denman George DC Jr., et al., 2013) is: 2.6% Start Atorvastatin 40mg  QD

## 2020-12-07 NOTE — Assessment & Plan Note (Signed)
In setting of indapamide.  Recently started on potassium supplement.  BMP today to monitor.  Plan to transition off of indapamide/potassium to losartan for kidney protection in setting of diabetes.  We will plan to repeat BMP at follow-up visit

## 2020-12-07 NOTE — Assessment & Plan Note (Signed)
Chronic. Normotensive. Nonsmoker Transition from Indapamide/potassium to Losartan 25mg  QD for kidney protection in setting of DM Follow up 2 weeks for repeat BMP and BP check

## 2020-12-07 NOTE — Assessment & Plan Note (Signed)
New problem. A1C 8.9. CBG much improved.  - increase Metformin 1000mg  BID - continue Farxiga - Follow up 2 weeks - increase farxiga to 10mg  if still has elevated BS at follow up - can consider addition of GLP-1 in future if better control is needed

## 2020-12-08 ENCOUNTER — Encounter: Payer: Self-pay | Admitting: Family Medicine

## 2020-12-08 LAB — BASIC METABOLIC PANEL
BUN/Creatinine Ratio: 16 (ref 9–23)
BUN: 14 mg/dL (ref 6–24)
CO2: 26 mmol/L (ref 20–29)
Calcium: 9.9 mg/dL (ref 8.7–10.2)
Chloride: 99 mmol/L (ref 96–106)
Creatinine, Ser: 0.87 mg/dL (ref 0.57–1.00)
Glucose: 90 mg/dL (ref 65–99)
Potassium: 4.2 mmol/L (ref 3.5–5.2)
Sodium: 141 mmol/L (ref 134–144)
eGFR: 83 mL/min/{1.73_m2} (ref >59)

## 2020-12-10 ENCOUNTER — Other Ambulatory Visit: Payer: Medicare Other

## 2020-12-17 ENCOUNTER — Ambulatory Visit: Payer: Self-pay

## 2020-12-18 IMAGING — CT CT MAXILLOFACIAL W/ CM
3 of 4 series · 14 of 47 positions shown, 16 images · IV contrast (APPLIED)
Comparison: None.

CLINICAL DATA: Facial swelling

EXAM:
CT MAXILLOFACIAL WITH CONTRAST
TECHNIQUE: Multidetector CT imaging of the maxillofacial structures was
performed with intravenous contrast. Multiplanar CT image
reconstructions were also generated.
CONTRAST:  75mL OMNIPAQUE IOHEXOL 300 MG/ML  SOLN

[Series 4: facial w/ 2.0 mpr ax · axial · 0.31mm/px · z∈[-218,-69]mm · 8 of 90 slices shown, 10 images]
[im 6/90  brain]
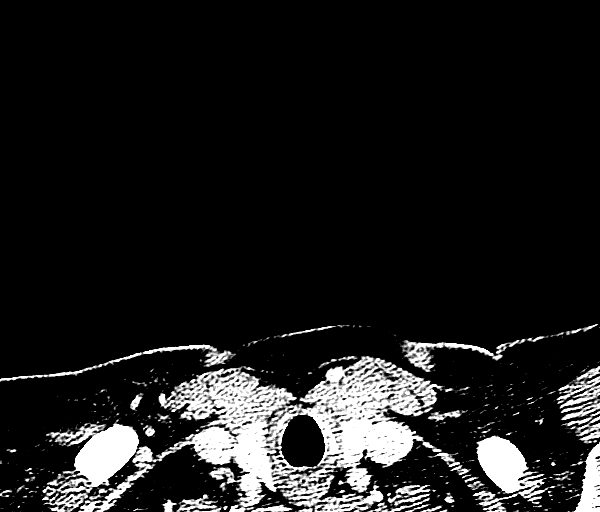
[im 6/90  bone]
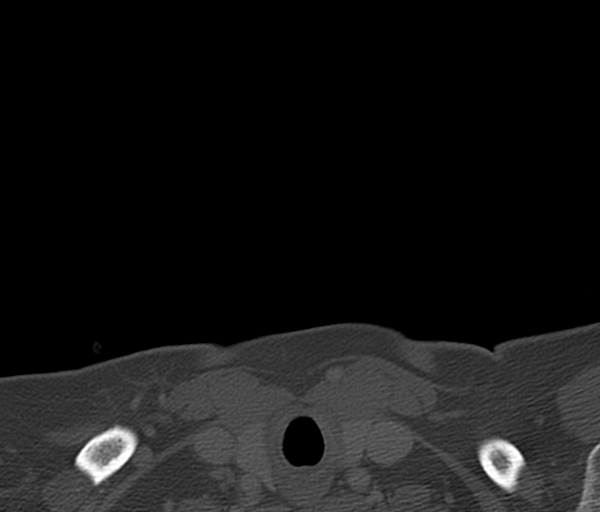
[im 18/90  bone]
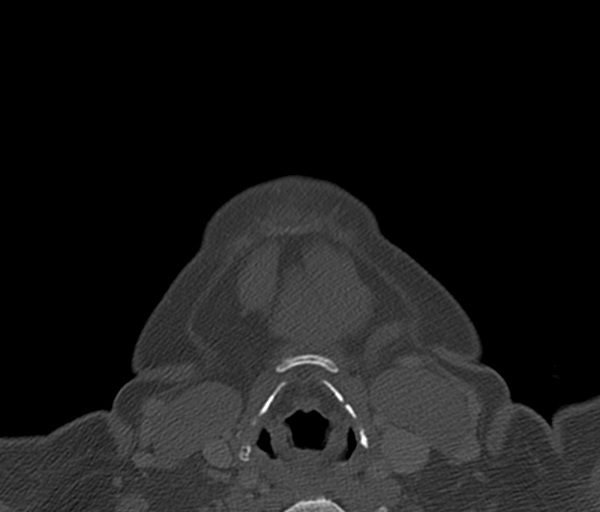
[im 30/90  bone]
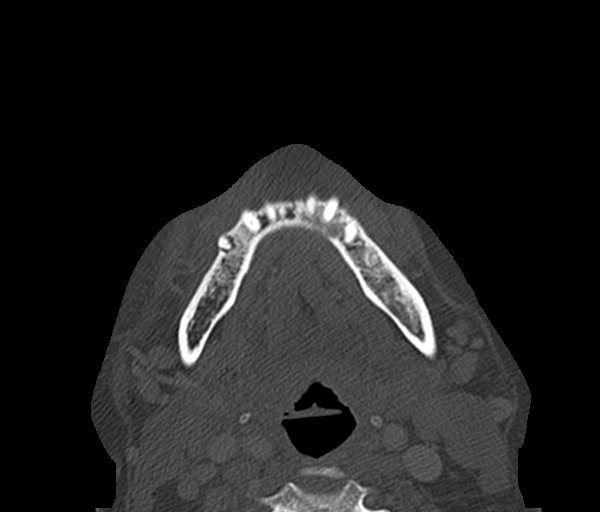
[im 42/90  bone]
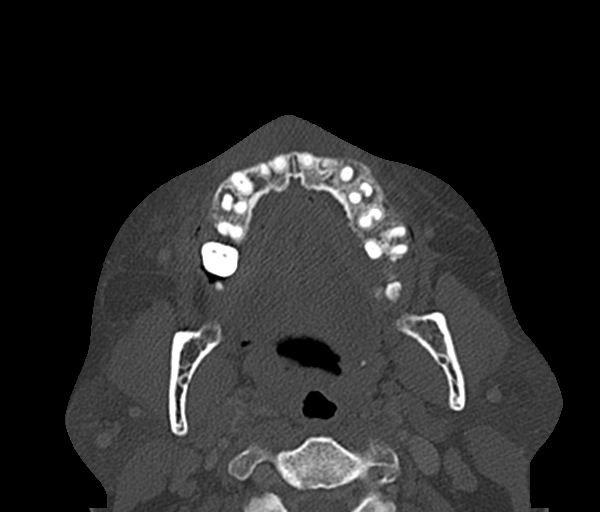
[im 48/90  brain]
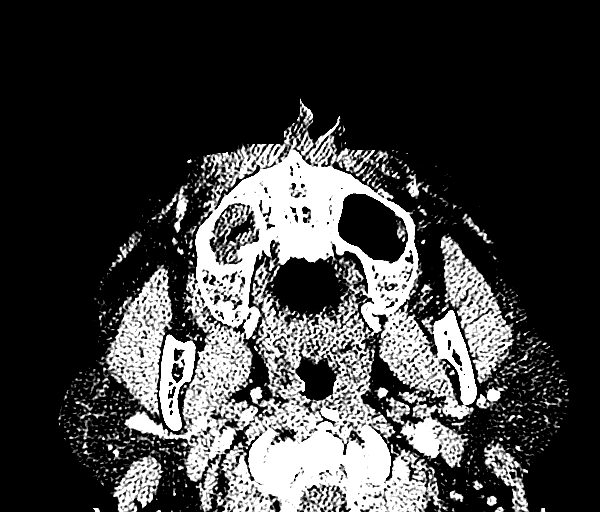
[im 48/90  bone]
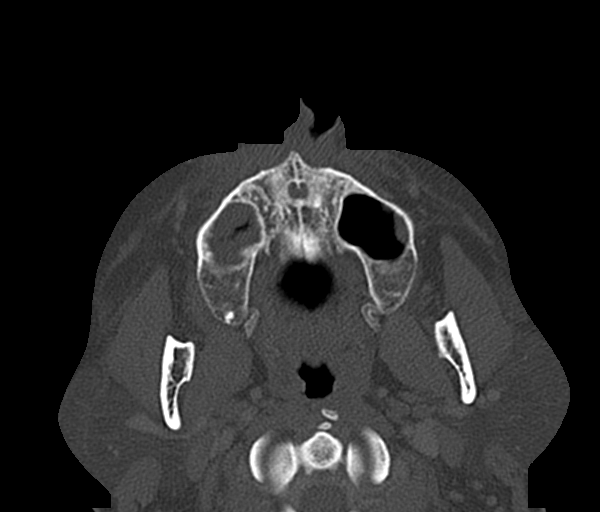
[im 60/90  bone]
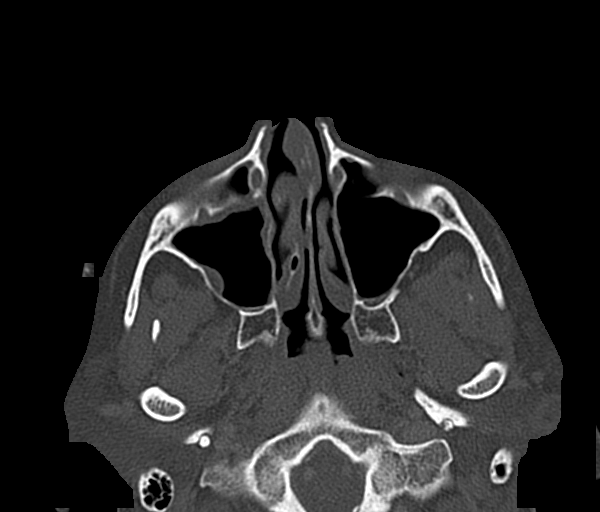
[im 72/90  bone]
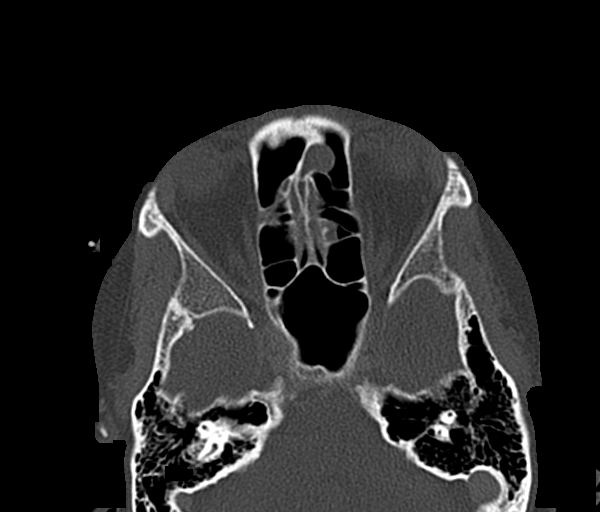
[im 84/90  bone]
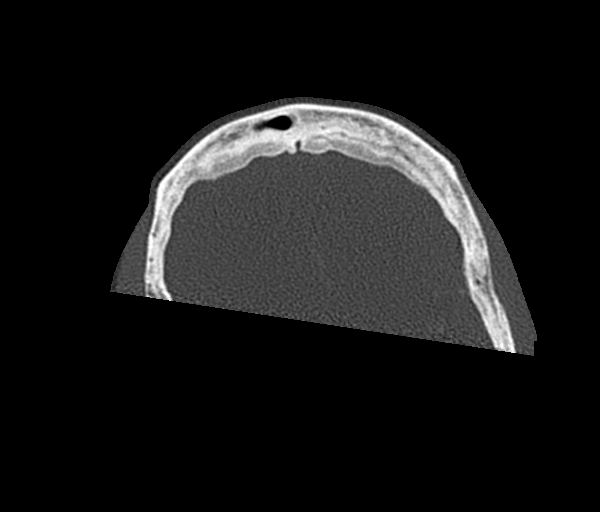

[Series 7: coronal soft tissue · coronal · 0.35mm/px · 3 of 76 slices shown]
[im 26/76  bone]
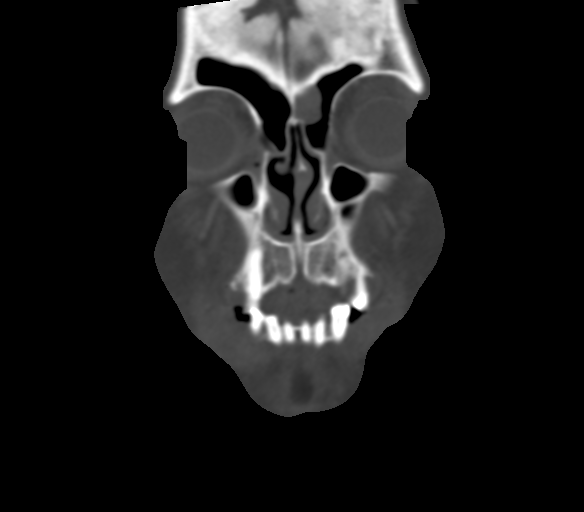
[im 34/76  bone]
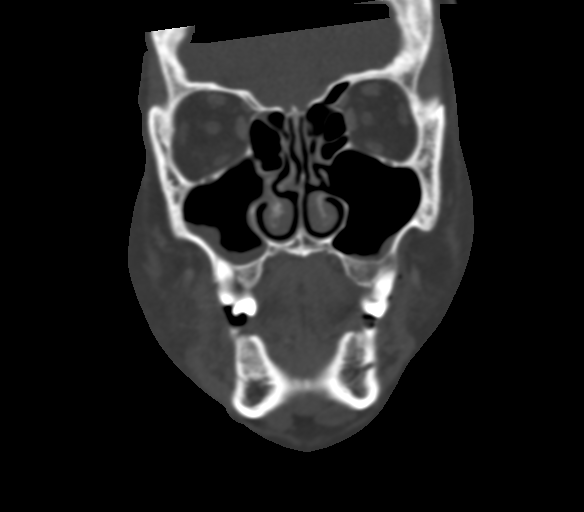
[im 42/76  bone]
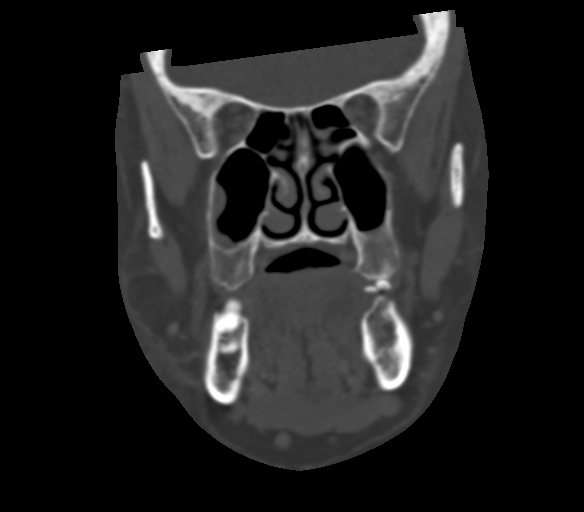

[Series 8: sagittal soft tissue · sagittal · 0.31mm/px · 3 of 100 slices shown]
[im 34/100  bone]
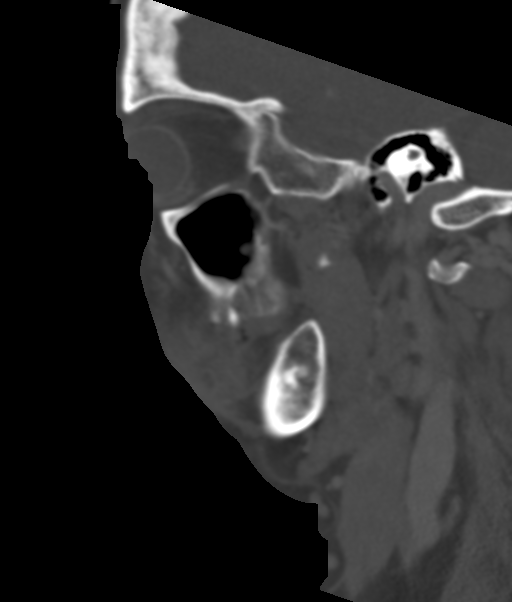
[im 50/100  bone]
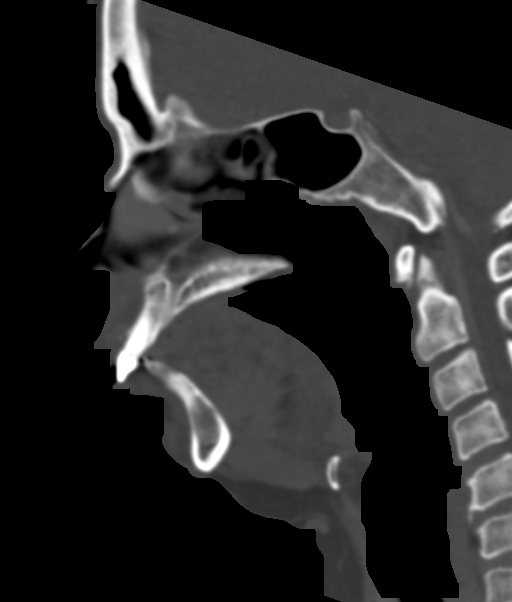
[im 67/100  bone]
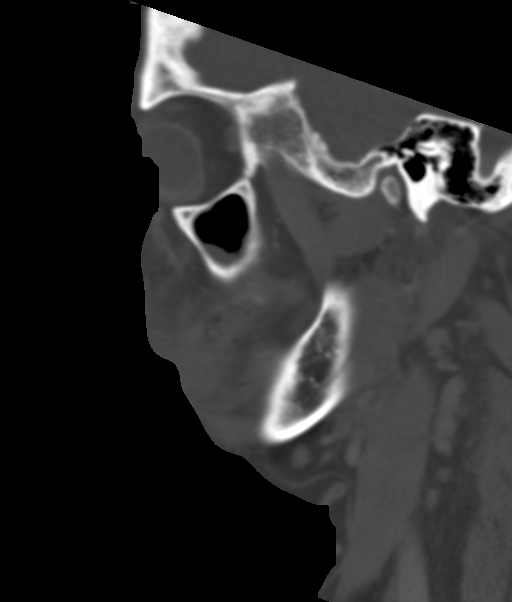

[14 of 47 positions shown; findings below may reference images not displayed]

FINDINGS: Osseous: No facial fracture.

Dental: Generally poor dentition without acute findings.

Orbits: The globes are intact. Normal appearance of the intra- and
extraconal fat. Symmetric extraocular muscles.

Sinuses: Mild bilateral maxillary sinus mucosal thickening.

Soft tissues: There is mild induration of the subcutaneous fat of
the lower right face. No fluid collection.

Limited intracranial: Normal.
IMPRESSION: 1. Nonspecific mild induration of the subcutaneous tissue of the
lower right face. No abscess or fluid collection.
2. Multiple dental caries and mild bilateral maxillary sinus mucosal
thickening, possibly odontogenic.

## 2020-12-19 NOTE — Progress Notes (Deleted)
   Subjective:   Patient ID: Patricia White    DOB: 1970/09/26, 50 y.o. female   MRN: [id number]  Patricia White is a 50 y.o. female with a history of hypertension, diabetes, hyperlipidemia, congenital left hemiparesis, infantile cerebral palsy, seizure, angioedema, hypokalemia, moderate intellectual disability, obesity, seasonal allergies here for diabetes and blood pressure follow-up  Diabetes: Last three A1C's below. Currently on Metformin 1000mg  BID***, Farxiga 5mg  QD. Endorses compliance. Notes CBGs range ***. Denies any hypoglycemia. Denies any polyuria, polydipsia, polyphagia. Due for ***. Dexcom: document in plan "A1C ***. Currently monitoring and taking >3 injection per day". Initial RX send message to RN team. Refills - just send to pharmacy "Patient is a good candidate for a CGM device given history of hypoglycemia and using 3 insulin injections per day."  - Continuous Blood Gluc Receiver (DEXCOM G6 RECEIVER) DEVI  - Continuous Blood Gluc Sensor (DEXCOM G6 SENSOR) MISC  - Continuous Blood Gluc Transmit (DEXCOM G6 TRANSMITTER) MISC Lantus/Novolug/Humalog Kwikpens/GLP-1 pens - still need to order BD Insulin Pen Needle (B-D UF III MINI PEN NEEDLES) 31G X 5 MM MISC  Lab Results  Component Value Date   HGBA1C 8.9 (A) 11/23/2020    HTN:    today. Currently on Losartan 25mg  QD. Endorses compliance. Non-smoker. Denies any chest pain, SOB, vision changes, or headaches.   Lab Results  Component Value Date   CREATININE 0.87 12/07/2020   CREATININE 0.91 11/25/2020   CREATININE 1.04 (H) 11/23/2020    HLD: Last lipid panel below. Currently on Atorvastatin 40mg  QD Endorses compliance. Denies any muscles aches or weakness. The 10-year ASCVD risk score 12/09/2020 DC Jr., et al., 2013) is: 2.9%   Lab Results  Component Value Date   CHOL 220 (H) 11/23/2020   HDL 52 11/23/2020   LDLCALC 121 (H) 11/23/2020   TRIG 272 (H) 11/23/2020   CHOLHDL 4.2 11/23/2020    Review of Systems:  Per HPI.    Objective:   There were no vitals taken for this visit. Vitals and nursing note reviewed.  General: pleasant ***, sitting comfortably in exam chair, well nourished, well developed, in no acute distress with non-toxic appearance HEENT: normocephalic, atraumatic, moist mucous membranes, oropharynx clear without erythema or exudate, TM normal bilaterally  Neck: supple, non-tender without lymphadenopathy CV: regular rate and rhythm without murmurs, rubs, or gallops, no lower extremity edema, 2+ radial and pedal pulses bilaterally Lungs: clear to auscultation bilaterally with normal work of breathing on room air Resp: breathing comfortably on room air, speaking in full sentences Abdomen: soft, non-tender, non-distended, no masses or organomegaly palpable, normoactive bowel sounds Skin: warm, dry, no rashes or lesions Extremities: warm and well perfused, normal tone MSK: ROM grossly intact, strength intact, gait normal Neuro: Alert and oriented, speech normal  Assessment & Plan:   No problem-specific Assessment & Plan notes found for this encounter.  No orders of the defined types were placed in this encounter.  No orders of the defined types were placed in this encounter.   {    This will disappear when note is signed, click to select method of visit    :1}  01/23/2021, DO PGY-3, [facility] 12/19/2020 3:39 AM

## 2020-12-23 ENCOUNTER — Ambulatory Visit: Payer: Medicare Other | Admitting: Family Medicine

## 2020-12-24 ENCOUNTER — Ambulatory Visit: Payer: Self-pay

## 2020-12-27 ENCOUNTER — Other Ambulatory Visit: Payer: Self-pay | Admitting: Adult Health

## 2020-12-27 DIAGNOSIS — G40909 Epilepsy, unspecified, not intractable, without status epilepticus: Secondary | ICD-10-CM

## 2020-12-28 ENCOUNTER — Other Ambulatory Visit: Payer: Self-pay | Admitting: *Deleted

## 2020-12-28 DIAGNOSIS — G40909 Epilepsy, unspecified, not intractable, without status epilepticus: Secondary | ICD-10-CM

## 2020-12-28 MED ORDER — CARBAMAZEPINE 200 MG PO TABS: ORAL_TABLET | ORAL | 11 refills | Status: DC

## 2020-12-28 MED ORDER — TEGRETOL 200 MG PO TABS: ORAL_TABLET | ORAL | 11 refills | Status: DC

## 2020-12-30 ENCOUNTER — Telehealth: Payer: Self-pay | Admitting: Adult Health

## 2020-12-30 ENCOUNTER — Telehealth: Payer: Self-pay | Admitting: *Deleted

## 2020-12-30 NOTE — Telephone Encounter (Signed)
Tegretol brand PA, key: [id number], [id number] Your information has been sent to [facility].

## 2020-12-30 NOTE — Telephone Encounter (Signed)
Will do PA for depakote, tegretol, topamax in separate tele note.

## 2020-12-30 NOTE — Telephone Encounter (Signed)
Approved for non-formulary exception Medicare Part D, through 08/13/2021, approval letter faxed to pharmacy.

## 2020-12-30 NOTE — Telephone Encounter (Signed)
[employer], spoke to [place].  Need PA on Brand Depakote and Topamax now with [facility].  Mother of pt already got generic tegretol (although new prescription was placed 12-28-20 cancelling generic, she needed the medication).

## 2020-12-30 NOTE — Telephone Encounter (Signed)
[name]) called, PA for  TOPAMAX 25 MG tablet and DEPAKOTE ER 250 MG 24 hr tablet was sent to the wrong insurance company. Would like a call from the nurse.

## 2020-12-30 NOTE — Telephone Encounter (Addendum)
Brand depakote approved as non-formulary exception under Medicare Part D until 08/13/21. Will notify pharmacy.

## 2020-12-30 NOTE — Telephone Encounter (Signed)
Received call form [facility]; she is reviewing brand tegretol PA, had questions about diagnosis. Answered to her stated satisfaction.

## 2020-12-30 NOTE — Telephone Encounter (Signed)
topamax brand PA, key:  [id number]. Your information has been sent to [facility].

## 2020-12-30 NOTE — Telephone Encounter (Addendum)
Brand Tegretol 200 mg tablet, use as directed, is approved for a non-formulary exception through 08/13/2021 under your Medicare Part D benefit. Will notify pharmacy.

## 2020-12-30 NOTE — Telephone Encounter (Signed)
depakote brand PA, key: [id number] - PA Case ID: [id number].  Your information has been sent to [facility].

## 2021-01-03 ENCOUNTER — Telehealth: Payer: Self-pay | Admitting: Adult Health

## 2021-01-03 NOTE — Telephone Encounter (Signed)
Placed in PA inbox

## 2021-01-03 NOTE — Telephone Encounter (Signed)
[name]) called, PA needs to be for Depakote Brand Extended release.  Would like a call from the nurse.

## 2021-01-04 ENCOUNTER — Other Ambulatory Visit: Payer: Self-pay | Admitting: Family Medicine

## 2021-01-04 DIAGNOSIS — I10 Essential (primary) hypertension: Secondary | ICD-10-CM

## 2021-01-04 NOTE — Telephone Encounter (Signed)
[name]) called, inform you we have not received the PA for the Depakote Extended release.

## 2021-01-05 NOTE — Telephone Encounter (Signed)
[facility]ed [facility].  Initiated CMM KEY [id number] for Depakote Er 250mg  tablets.  Expedited.

## 2021-01-05 NOTE — Telephone Encounter (Signed)
Received approval [id number] depakote ER 250mg . 08-13-2021. [facility]ed to [facility]. Confirmed.

## 2021-01-10 NOTE — Progress Notes (Deleted)
Subjective:   Patient ID: Patricia White    DOB: [birth year], 50 y.o. female   MRN: [id number]  Patricia White is a 50 y.o. female with a history of hypertension, diabetes, hyperlipidemia, congenital left hemiparesis, infantile cerebral palsy, seizure, angioedema, hypokalemia, moderate intellectual disability, obesity, seasonal allergies here for diabetes and blood pressure follow-up  Diabetes: Last three A1C's below. Currently on Metformin 1000mg  BID***, Farxiga 5mg  QD. Endorses compliance. Notes CBGs range ***. Denies any hypoglycemia. Denies any polyuria, polydipsia, polyphagia. Due for ***. Dexcom: document in plan "A1C ***. Currently monitoring and taking >3 injection per day". Initial RX send message to RN team. Refills - just send to pharmacy "Patient is a good candidate for a CGM device given history of hypoglycemia and using 3 insulin injections per day."  - Continuous Blood Gluc Receiver (DEXCOM G6 RECEIVER) DEVI  - Continuous Blood Gluc Sensor (DEXCOM G6 SENSOR) MISC  - Continuous Blood Gluc Transmit (DEXCOM G6 TRANSMITTER) MISC Lantus/Novolug/Humalog Kwikpens/GLP-1 pens - still need to order BD Insulin Pen Needle (B-D UF III MINI PEN NEEDLES) 31G X 5 MM MISC  Lab Results  Component Value Date   HGBA1C 8.9 (A) 11/23/2020    HTN:    today. Currently on Losartan 25mg  QD. Endorses compliance. Non-smoker. Denies any chest pain, SOB, vision changes, or headaches.   Lab Results  Component Value Date   CREATININE 0.87 12/07/2020   CREATININE 0.91 11/25/2020   CREATININE 1.04 (H) 11/23/2020    HLD: Last lipid panel below. Currently on Atorvastatin 40mg  QD Endorses compliance. Denies any muscles aches or weakness. The 10-year ASCVD risk score 12/09/2020 DC Jr., et al., 2013) is: 2.9%   Lab Results  Component Value Date   CHOL 220 (H) 11/23/2020   HDL 52 11/23/2020   LDLCALC 121 (H) 11/23/2020   TRIG 272 (H) 11/23/2020   CHOLHDL 4.2 11/23/2020    Review of Systems:  Per HPI.    Objective:   There were no vitals taken for this visit. Vitals and nursing note reviewed.  General: pleasant ***, sitting comfortably in exam chair, well nourished, well developed, in no acute distress with non-toxic appearance HEENT: normocephalic, atraumatic, moist mucous membranes, oropharynx clear without erythema or exudate, TM normal bilaterally  Neck: supple, non-tender without lymphadenopathy CV: regular rate and rhythm without murmurs, rubs, or gallops, no lower extremity edema, 2+ radial and pedal pulses bilaterally Lungs: clear to auscultation bilaterally with normal work of breathing on room air Resp: breathing comfortably on room air, speaking in full sentences Abdomen: soft, non-tender, non-distended, no masses or organomegaly palpable, normoactive bowel sounds Skin: warm, dry, no rashes or lesions Extremities: warm and well perfused, normal tone MSK: ROM grossly intact, strength intact, gait normal Neuro: Alert and oriented, speech normal  Assessment & Plan:   No problem-specific Assessment & Plan notes found for this encounter.  No orders of the defined types were placed in this encounter.  No orders of the defined types were placed in this encounter.   Diabetes: - continue Metformin 1000mg  BID - increase Farxiga 10mg  QD - follow up 2 months for A1C  - consider addition of GLP-1 if further glycemic control is needed  HTN: Chronic. Normotensive***. Nonsmoker - continue Losartan 25mg  QD - BMP today to monitor electrolytes and kidney function  HLD: - continue Atorvastatin 40mg  QD  Hypokalemia: In setting of indapamide. Transitioned to Losartan. Never returned for follow up BMP as recommended. BMP today to monitor.   {    This  will disappear when note is signed, click to select method of visit    :1}  Orpah Cobb, DO PGY-3, [facility] 01/10/2021 11:03 AM

## 2021-01-11 ENCOUNTER — Ambulatory Visit: Payer: Medicare Other | Admitting: Family Medicine

## 2021-01-16 NOTE — Progress Notes (Signed)
   Subjective:   Patient ID: Patricia White    DOB: 1970/12/11, 50 y.o. female   MRN: [id number]  Patricia White is a 50 y.o. female with a history of hypertension, diabetes, hyperlipidemia, congenital left hemiparesis, infantile cerebral palsy, seizure, angioedema, hypokalemia, moderate intellectual disability, obesity, seasonal allergies here for diabetes and blood pressure follow-up  Diabetes: Last three A1C's below. Currently on Metformin 1000mg  BID, Farxiga 5mg  QD. Endorses compliance. Notes CBGs range 120. Highest 140. Denies any hypoglycemia. Denies any polyuria, polydipsia, polyphagia. Due for diabetic foot exam and PNA vaccine.  Lab Results  Component Value Date   HGBA1C 8.9 (A) 11/23/2020    HTN:  BP: 104/62 today. Currently on Losartan 25mg  QD. Endorses compliance. Non-smoker. Denies any chest pain, SOB, vision changes, or headaches.   Lab Results  Component Value Date   CREATININE 0.91 01/19/2021   CREATININE 0.87 12/07/2020   CREATININE 0.91 11/25/2020    HLD: Last lipid panel below. Currently on Atorvastatin 40mg  QD Endorses compliance. Denies any muscles aches or weakness. The 10-year ASCVD risk score 03/21/2021 DC Jr., et al., 2013) is: 3.4%   Lab Results  Component Value Date   CHOL 220 (H) 11/23/2020   HDL 52 11/23/2020   LDLCALC 121 (H) 11/23/2020   TRIG 272 (H) 11/23/2020   CHOLHDL 4.2 11/23/2020    Review of Systems:  Per HPI.   Objective:   BP 104/62   Pulse 91   Ht 5\' 2"  (1.575 m)   Wt 150 lb 12.8 oz (68.4 kg)   SpO2 99%   BMI 27.58 kg/m  Vitals and nursing note reviewed.  General: pleasant middle aged female, sitting comfortably in exam chair, well nourished, well developed, in no acute distress with non-toxic appearance Resp: breathing comfortably on room air, speaking in full sentences  Assessment & Plan:   Essential hypertension Chronic. Normotensive. Nonsmoker - continue Losartan 25mg  QD - BMP today to monitor electrolytes and kidney  function  Hyperlipidemia associated with type 2 diabetes mellitus (HCC) Chronic.  - continue Atorvastatin 40mg  QD  Diabetes mellitus without complication (HCC) Chronic. Improved blood sugar readings.  - continue Metformin 1000mg  BID - increase Farxiga 10mg  QD - follow up 1 month for A1C  - consider addition of GLP-1 if further glycemic control is needed - recommend diabetic foot exam at next visit  - encouraged to see eye doctor for diabetic eye exam  Hypokalemia Chronic. Persistent. K 3.0 today. Will start Potassium supplement 10mg  QD.  Recommend repeat BMP in 1 month to ensure good response.   Orders Placed This Encounter  Procedures   Basic Metabolic Panel   Meds ordered this encounter  Medications   dapagliflozin propanediol (FARXIGA) 10 MG TABS tablet    Sig: Take 1 tablet (10 mg total) by mouth daily.    Dispense:  90 tablet    Refill:  3   metFORMIN (GLUCOPHAGE-XR) 500 MG 24 hr tablet    Sig: Take 2 tablets (1,000 mg total) by mouth 2 (two) times daily with a meal.    Dispense:  180 tablet    Refill:  3   losartan (COZAAR) 25 MG tablet    Sig: Take 1 tablet (25 mg total) by mouth at bedtime.    Dispense:  90 tablet    Refill:  3      01/23/2021, DO PGY-3, [facility] 01/20/2021 7:49 PM

## 2021-01-19 ENCOUNTER — Encounter: Payer: Self-pay | Admitting: Family Medicine

## 2021-01-19 ENCOUNTER — Other Ambulatory Visit: Payer: Self-pay

## 2021-01-19 ENCOUNTER — Ambulatory Visit (INDEPENDENT_AMBULATORY_CARE_PROVIDER_SITE_OTHER): Payer: Medicare Other | Admitting: Family Medicine

## 2021-01-19 DIAGNOSIS — E876 Hypokalemia: Secondary | ICD-10-CM

## 2021-01-19 DIAGNOSIS — E119 Type 2 diabetes mellitus without complications: Secondary | ICD-10-CM | POA: Diagnosis not present

## 2021-01-19 DIAGNOSIS — I10 Essential (primary) hypertension: Secondary | ICD-10-CM

## 2021-01-19 DIAGNOSIS — E1169 Type 2 diabetes mellitus with other specified complication: Secondary | ICD-10-CM

## 2021-01-19 DIAGNOSIS — E785 Hyperlipidemia, unspecified: Secondary | ICD-10-CM

## 2021-01-19 MED ORDER — DAPAGLIFLOZIN PROPANEDIOL 10 MG PO TABS: 10.0000 mg | ORAL_TABLET | Freq: Every day | ORAL | 3 refills | Status: DC

## 2021-01-19 MED ORDER — LOSARTAN POTASSIUM 25 MG PO TABS
25.0000 mg | ORAL_TABLET | Freq: Every day | ORAL | 3 refills | Status: DC
Start: 2021-01-19 — End: 2021-10-18

## 2021-01-19 MED ORDER — METFORMIN HCL ER 500 MG PO TB24: 1000.0000 mg | ORAL_TABLET | Freq: Two times a day (BID) | ORAL | 3 refills | Status: DC

## 2021-01-19 NOTE — Patient Instructions (Signed)
It was a pleasure to see you today!  Thank you for choosing [facility] for your primary care.   Our plans for today were:  Diabetes:  Continue metformin 1000 mg twice a day  Continue Farxiga 10 mg daily  I am getting blood work today to make sure kidney function looks good  High blood pressure:  Continue losartan 25 mg daily  Cholesterol:  Continue atorvastatin 40 mg daily  To keep you healthy, please keep in mind the following health maintenance items that you are due for:   1. Pneumonia vaccine 2. Diabetic eye exam - please see your eye doctor at your earliest convenience 3. Pap smear   We are checking some labs today, I will call you if they are abnormal will send you a MyChart message or a letter if they are normal.  If you do not hear about your labs in the next 2 weeks please let us know.  BRING ALL OF YOUR MEDICATIONS WITH YOU TO EVERY VISIT   You should return to our clinic on 03/03/21 for diabetes follow up.   Best Wishes,   Orpah Cobb, DO

## 2021-01-20 LAB — BASIC METABOLIC PANEL
BUN/Creatinine Ratio: 18 (ref 9–23)
BUN: 16 mg/dL (ref 6–24)
CO2: 26 mmol/L (ref 20–29)
Calcium: 9.7 mg/dL (ref 8.7–10.2)
Chloride: 91 mmol/L — ABNORMAL LOW (ref 96–106)
Creatinine, Ser: 0.91 mg/dL (ref 0.57–1.00)
Glucose: 105 mg/dL — ABNORMAL HIGH (ref 65–99)
Potassium: 3.1 mmol/L — ABNORMAL LOW (ref 3.5–5.2)
Sodium: 136 mmol/L (ref 134–144)
eGFR: 78 mL/min/{1.73_m2} (ref >59)

## 2021-01-20 MED ORDER — POTASSIUM CHLORIDE CRYS ER 10 MEQ PO TBCR: 10.0000 meq | EXTENDED_RELEASE_TABLET | Freq: Every day | ORAL | 1 refills | Status: DC

## 2021-01-20 NOTE — Assessment & Plan Note (Addendum)
Chronic. Normotensive. Nonsmoker - continue Losartan 25mg  QD - BMP today to monitor electrolytes and kidney function

## 2021-01-20 NOTE — Assessment & Plan Note (Signed)
Chronic. Persistent. K 3.0 today. Will start Potassium supplement 10mg  QD.  Recommend repeat BMP in 1 month to ensure good response.

## 2021-01-20 NOTE — Assessment & Plan Note (Signed)
Chronic. - continue Atorvastatin 40mg QD 

## 2021-01-20 NOTE — Assessment & Plan Note (Signed)
Chronic. Improved blood sugar readings.  - continue Metformin 1000mg  BID - increase Farxiga 10mg  QD - follow up 1 month for A1C  - consider addition of GLP-1 if further glycemic control is needed - recommend diabetic foot exam at next visit  - encouraged to see eye doctor for diabetic eye exam

## 2021-01-31 ENCOUNTER — Other Ambulatory Visit: Payer: Self-pay | Admitting: Family Medicine

## 2021-01-31 DIAGNOSIS — I10 Essential (primary) hypertension: Secondary | ICD-10-CM

## 2021-01-31 NOTE — Telephone Encounter (Signed)
No longer taking Indapamide. She has been transitioned to Losartan due to new dx of DM

## 2021-02-01 ENCOUNTER — Other Ambulatory Visit: Payer: Self-pay | Admitting: Family Medicine

## 2021-02-01 DIAGNOSIS — I10 Essential (primary) hypertension: Secondary | ICD-10-CM

## 2021-02-02 ENCOUNTER — Ambulatory Visit: Payer: Medicare Other | Admitting: Registered"

## 2021-02-13 DIAGNOSIS — Z20822 Contact with and (suspected) exposure to covid-19: Secondary | ICD-10-CM | POA: Diagnosis not present

## 2021-02-16 ENCOUNTER — Ambulatory Visit: Payer: Medicare Other | Admitting: Family Medicine

## 2021-02-16 NOTE — Progress Notes (Deleted)
   [address] [place] [place] [address] Dept: [phone]  FOLLOW UP NOTE  Patient ID: Patricia White, female    DOB: 1971/05/13  Age: 50 y.o. MRN: [id number] Date of Office Visit: 02/16/2021  Assessment  Chief Complaint: No chief complaint on file.  HPI Patricia White    Drug Allergies:  No Known Allergies  Physical Exam: There were no vitals taken for this visit.   Physical Exam  Diagnostics:    Assessment and Plan: No diagnosis found.  No orders of the defined types were placed in this encounter.   There are no Patient Instructions on file for this visit.  No follow-ups on file.    Thank you for the opportunity to care for this patient.  Please do not hesitate to contact me with questions.  Thermon Leyland, FNP [facility]

## 2021-02-17 DIAGNOSIS — Z20822 Contact with and (suspected) exposure to covid-19: Secondary | ICD-10-CM | POA: Diagnosis not present

## 2021-02-27 DIAGNOSIS — Z20822 Contact with and (suspected) exposure to covid-19: Secondary | ICD-10-CM | POA: Diagnosis not present

## 2021-03-02 ENCOUNTER — Other Ambulatory Visit: Payer: Self-pay | Admitting: Allergy

## 2021-03-02 NOTE — Progress Notes (Signed)
    SUBJECTIVE:   CHIEF COMPLAINT / HPI:   Ms. Patricia White is a 50 yo F who presents with her aunt (caregiver) for the below:  Diabetes Current Regimen: Farxiga 10 mg daily, metformin 1000 mg twice daily CBGs: Fasting 100-120, Low of 80 years prior but no hypoglycemia episodes recently Last A1c: 8.9 on 11/23/2020  Denies polyuria, polydipsia, hypoglycemia Last Eye Exam: Never, discussed scheduling Statin: Lipitor 40 mg daily ACE/ARB: Losartan 25 mg qhs  Hypertension: - Medications: Losartan 25 mg qhs - Compliance: Yes - Denies any SOB, CP, or symptoms of hypotension  PERTINENT  PMH / PSH: Hx of hypokalemia (takes potassium supplements), Infantile cerebral palsy, congential left hemiparesis, Hx of seizure (chronically taking depakote and tegretol)  OBJECTIVE:   BP 106/60   Pulse 81   Wt 150 lb 12.8 oz (68.4 kg)   SpO2 97%   BMI 27.58 kg/m   Diabetic Foot Exam - Simple   Simple Foot Form Visual Inspection No deformities, no ulcerations, no other skin breakdown bilaterally: Yes Sensation Testing Intact to touch and monofilament testing bilaterally: Yes Pulse Check Posterior Tibialis and Dorsalis pulse intact bilaterally: Yes Comments     General: Appears well, no acute distress. Age appropriate. Able to remove shoes on her own for foot exam.  Cardiac: RRR, normal heart sounds, no murmurs Respiratory: CTAB, normal effort Extremities: No edema or cyanosis. Skin: Warm and dry, no rashes noted Neuro: alert and oriented x4, obvious left hemiparesis/spacticity. Psych: normal affect, engages in conversation    ASSESSMENT/PLAN:   Diabetes mellitus without complication (HCC) A1c today 5.0, well controlled.  Continue current medications. Consider decreasing metformin at follow up. Can follow up in 3-6 months. Will communicate pending labs below.   Essential hypertension Well controlled.  -Continue current medications  Hypokalemia CMP today to monitor along with LFTs due  to patient taking depakote/tegretol. Consider need to increase/decrease potassium supplement when labs available.     Lavonda Jumbo, DO [facility]

## 2021-03-03 ENCOUNTER — Ambulatory Visit (INDEPENDENT_AMBULATORY_CARE_PROVIDER_SITE_OTHER): Payer: Medicare Other | Admitting: Family Medicine

## 2021-03-03 ENCOUNTER — Other Ambulatory Visit: Payer: Self-pay

## 2021-03-03 VITALS — BP 106/60 | HR 81 | Wt 150.8 lb

## 2021-03-03 DIAGNOSIS — E119 Type 2 diabetes mellitus without complications: Secondary | ICD-10-CM | POA: Diagnosis not present

## 2021-03-03 DIAGNOSIS — E876 Hypokalemia: Secondary | ICD-10-CM

## 2021-03-03 DIAGNOSIS — I10 Essential (primary) hypertension: Secondary | ICD-10-CM | POA: Diagnosis not present

## 2021-03-03 LAB — POCT GLYCOSYLATED HEMOGLOBIN (HGB A1C): HbA1c, POC (controlled diabetic range): 5 % (ref 0.0–7.0)

## 2021-03-03 NOTE — Patient Instructions (Signed)
Thank you for coming in today.  Today we discussed diabetes your A1c is below diabetic range, good job!  Continue current diabetic regimen.  Continue current medications for high blood pressure.  We will obtain labs today and I will communicate when available.  Follow-up as needed for repeat lipid panel in 1 year.  Dr. Salvadore Dom

## 2021-03-04 LAB — COMPREHENSIVE METABOLIC PANEL
ALT: 7 IU/L (ref 0–32)
AST: 11 IU/L (ref 0–40)
Albumin/Globulin Ratio: 1 — ABNORMAL LOW (ref 1.2–2.2)
Albumin: 4.2 g/dL (ref 3.8–4.8)
Alkaline Phosphatase: 97 IU/L (ref 44–121)
BUN/Creatinine Ratio: 13 (ref 9–23)
BUN: 10 mg/dL (ref 6–24)
Bilirubin Total: <0.2 mg/dL (ref 0.0–1.2)
CO2: 21 mmol/L (ref 20–29)
Calcium: 9 mg/dL (ref 8.7–10.2)
Chloride: 99 mmol/L (ref 96–106)
Creatinine, Ser: 0.8 mg/dL (ref 0.57–1.00)
Globulin, Total: 4.3 g/dL (ref 1.5–4.5)
Glucose: 75 mg/dL (ref 65–99)
Potassium: 4 mmol/L (ref 3.5–5.2)
Sodium: 139 mmol/L (ref 134–144)
Total Protein: 8.5 g/dL (ref 6.0–8.5)
eGFR: 91 mL/min/{1.73_m2} (ref >59)

## 2021-03-04 NOTE — Assessment & Plan Note (Signed)
Well controlled. Continue current medications  

## 2021-03-04 NOTE — Assessment & Plan Note (Signed)
A1c today 5.0, well controlled.  Continue current medications. Consider decreasing metformin at follow up. Can follow up in 3-6 months. Will communicate pending labs below.

## 2021-03-04 NOTE — Assessment & Plan Note (Addendum)
CMP today to monitor along with LFTs due to patient taking depakote/tegretol. Consider need to increase/decrease potassium supplement when labs available.

## 2021-03-07 DIAGNOSIS — Z20822 Contact with and (suspected) exposure to covid-19: Secondary | ICD-10-CM | POA: Diagnosis not present

## 2021-03-14 DIAGNOSIS — Z20822 Contact with and (suspected) exposure to covid-19: Secondary | ICD-10-CM | POA: Diagnosis not present

## 2021-03-16 ENCOUNTER — Other Ambulatory Visit: Payer: Self-pay | Admitting: Family Medicine

## 2021-03-16 DIAGNOSIS — E876 Hypokalemia: Secondary | ICD-10-CM

## 2021-03-17 ENCOUNTER — Other Ambulatory Visit: Payer: Self-pay | Admitting: Allergy

## 2021-03-21 DIAGNOSIS — Z20822 Contact with and (suspected) exposure to covid-19: Secondary | ICD-10-CM | POA: Diagnosis not present

## 2021-03-30 DIAGNOSIS — Z1152 Encounter for screening for COVID-19: Secondary | ICD-10-CM | POA: Diagnosis not present

## 2021-04-13 ENCOUNTER — Encounter: Payer: Medicare Other | Admitting: Obstetrics & Gynecology

## 2021-04-15 DIAGNOSIS — Z1152 Encounter for screening for COVID-19: Secondary | ICD-10-CM | POA: Diagnosis not present

## 2021-05-11 ENCOUNTER — Other Ambulatory Visit: Payer: Self-pay

## 2021-05-11 DIAGNOSIS — E876 Hypokalemia: Secondary | ICD-10-CM

## 2021-05-11 MED ORDER — POTASSIUM CHLORIDE ER 10 MEQ PO TBCR: 10.0000 meq | EXTENDED_RELEASE_TABLET | Freq: Every day | ORAL | 0 refills | Status: DC

## 2021-05-20 ENCOUNTER — Encounter: Payer: Medicare Other | Admitting: Obstetrics & Gynecology

## 2021-06-06 ENCOUNTER — Ambulatory Visit: Payer: Medicare Other | Admitting: Dermatology

## 2021-06-06 ENCOUNTER — Other Ambulatory Visit: Payer: Self-pay

## 2021-06-27 ENCOUNTER — Encounter: Payer: Medicare Other | Admitting: Obstetrics & Gynecology

## 2021-06-27 DIAGNOSIS — Z0289 Encounter for other administrative examinations: Secondary | ICD-10-CM

## 2021-06-28 NOTE — Progress Notes (Signed)
    SUBJECTIVE:   CHIEF COMPLAINT / HPI: DM f/u  T2DM On metformin 1000 mg BID, dapagliflozin 10 mg Well controlled, A1c 5.0 in July Reports good compliance to medications  Hot flashes Reporting symptoms of hot flashes for 1 week.  She is postmenopausal, reports went through menopause years ago.  Denies fever, cough, sore throat.  No other symptoms.  PERTINENT  PMH / PSH: T2DM, HTN, HLD, CP, seizures, obesity  OBJECTIVE:   BP 108/83   Pulse 87   Ht 5\' 2"  (1.575 m)   Wt 133 lb 12.8 oz (60.7 kg)   SpO2 100%   BMI 24.47 kg/m   General: Alert, NAD CV: RRR, no murmurs Pulm: CTAB, no wheezes or rales  Lab Results  Component Value Date   HGBA1C 4.9 06/29/2021     ASSESSMENT/PLAN:   Diabetes mellitus without complication (HCC) Very well controlled.  We will discontinue metformin.  Continue on dapagliflozin.  Recheck A1c in 3 months.  Hot flashes due to menopause Postmenopausal.  Hot flashes for 1 week.  Recommended nonpharmacological treatment for now.  Can consider pharmacological options if persistent and bothersome.     07/01/2021, MD [facility]

## 2021-06-28 NOTE — Patient Instructions (Addendum)
It was nice seeing you today!  For hot flashes, try simple behavioral measures, such as lowering room temperature, using fans, dressing in layers of clothing that can be easily shed, and avoiding triggers (such as spicy foods and stressful situations), can help reduce the number of hot flashes.  Stop the metformin. Great job!  Please arrive at least 15 minutes prior to your scheduled appointments.  Stay well, Patricia Deeds, MD [facility] [phone]

## 2021-06-29 ENCOUNTER — Ambulatory Visit (INDEPENDENT_AMBULATORY_CARE_PROVIDER_SITE_OTHER): Payer: Medicare Other | Admitting: Family Medicine

## 2021-06-29 ENCOUNTER — Other Ambulatory Visit: Payer: Self-pay

## 2021-06-29 ENCOUNTER — Ambulatory Visit (INDEPENDENT_AMBULATORY_CARE_PROVIDER_SITE_OTHER): Payer: Medicare Other

## 2021-06-29 VITALS — BP 108/83 | HR 87 | Ht 62.0 in | Wt 133.8 lb

## 2021-06-29 DIAGNOSIS — N951 Menopausal and female climacteric states: Secondary | ICD-10-CM

## 2021-06-29 DIAGNOSIS — Z23 Encounter for immunization: Secondary | ICD-10-CM

## 2021-06-29 DIAGNOSIS — E119 Type 2 diabetes mellitus without complications: Secondary | ICD-10-CM | POA: Diagnosis not present

## 2021-06-29 LAB — POCT GLYCOSYLATED HEMOGLOBIN (HGB A1C): HbA1c, POC (controlled diabetic range): 4.9 % (ref 0.0–7.0)

## 2021-06-30 DIAGNOSIS — N951 Menopausal and female climacteric states: Secondary | ICD-10-CM | POA: Insufficient documentation

## 2021-06-30 NOTE — Assessment & Plan Note (Signed)
Very well controlled.  We will discontinue metformin.  Continue on dapagliflozin.  Recheck A1c in 3 months.

## 2021-06-30 NOTE — Assessment & Plan Note (Signed)
Postmenopausal.  Hot flashes for 1 week.  Recommended nonpharmacological treatment for now.  Can consider pharmacological options if persistent and bothersome.

## 2021-07-16 NOTE — Progress Notes (Signed)
Chart reviewed, agree above plan ?

## 2021-07-26 ENCOUNTER — Telehealth: Payer: Self-pay

## 2021-07-26 DIAGNOSIS — E119 Type 2 diabetes mellitus without complications: Secondary | ICD-10-CM

## 2021-07-27 ENCOUNTER — Other Ambulatory Visit: Payer: Self-pay | Admitting: Family Medicine

## 2021-07-27 DIAGNOSIS — E876 Hypokalemia: Secondary | ICD-10-CM

## 2021-07-29 MED ORDER — METFORMIN HCL ER 500 MG PO TB24: 1000.0000 mg | ORAL_TABLET | Freq: Two times a day (BID) | ORAL | 3 refills | Status: DC

## 2021-07-29 NOTE — Telephone Encounter (Signed)
Rx sent to pharmacy.   Patricia Aja Autry-Lott, DO 07/29/2021, 7:58 PM PGY-3, [facility]

## 2021-08-02 ENCOUNTER — Other Ambulatory Visit: Payer: Self-pay | Admitting: Adult Health

## 2021-08-02 ENCOUNTER — Other Ambulatory Visit: Payer: Self-pay | Admitting: *Deleted

## 2021-08-02 ENCOUNTER — Other Ambulatory Visit: Payer: Self-pay | Admitting: Allergy

## 2021-08-02 DIAGNOSIS — E785 Hyperlipidemia, unspecified: Secondary | ICD-10-CM

## 2021-08-02 DIAGNOSIS — G40909 Epilepsy, unspecified, not intractable, without status epilepticus: Secondary | ICD-10-CM

## 2021-08-02 MED ORDER — ATORVASTATIN CALCIUM 40 MG PO TABS: 40.0000 mg | ORAL_TABLET | Freq: Every day | ORAL | 3 refills | Status: DC

## 2021-09-22 ENCOUNTER — Other Ambulatory Visit: Payer: Self-pay

## 2021-09-22 ENCOUNTER — Encounter: Payer: Self-pay | Admitting: Family Medicine

## 2021-09-22 ENCOUNTER — Ambulatory Visit (INDEPENDENT_AMBULATORY_CARE_PROVIDER_SITE_OTHER): Payer: Medicare Other | Admitting: Family Medicine

## 2021-09-22 VITALS — BP 129/90 | HR 79 | Wt 146.6 lb

## 2021-09-22 DIAGNOSIS — I10 Essential (primary) hypertension: Secondary | ICD-10-CM | POA: Diagnosis not present

## 2021-09-22 DIAGNOSIS — E119 Type 2 diabetes mellitus without complications: Secondary | ICD-10-CM | POA: Diagnosis not present

## 2021-09-22 LAB — POCT GLYCOSYLATED HEMOGLOBIN (HGB A1C): Hemoglobin A1C: 5 % (ref 4.0–5.6)

## 2021-09-22 NOTE — Patient Instructions (Signed)
Thank for coming in today. We discussed blood pressure which is appropriate today.  If continues to be [id number] systolic (the top number) at home please let us know and we can make medication adjustments over the phone. Your A1c today is 5.  Discontinue Farxiga. Follow-up with me in 3 months for repeat A1c Would encourage follow-up with neurologist.  Dr. Janus Molder

## 2021-09-22 NOTE — Progress Notes (Deleted)
° ° °  SUBJECTIVE:  ° °CHIEF COMPLAINT / HPI:  ° °*** ° °PERTINENT  PMH / PSH: *** ° °OBJECTIVE:  ° °BP 129/90    Pulse 79    Wt 146 lb 9.6 oz (66.5 kg)    SpO2 100%    BMI 26.81 kg/m²   °*** ° °ASSESSMENT/PLAN:  ° °No problem-specific Assessment & Plan notes found for this encounter. °  ° ° °Lashawnda Hancox Autry-Lott, DO °[facility]  °

## 2021-09-22 NOTE — Progress Notes (Deleted)
° ° °  SUBJECTIVE:   CHIEF COMPLAINT / HPI:   ***  PERTINENT  PMH / PSH: ***  OBJECTIVE:   BP 129/90    Pulse 79    Wt 146 lb 9.6 oz (66.5 kg)    SpO2 100%    BMI 26.81 kg/m   ***  ASSESSMENT/PLAN:   No problem-specific Assessment & Plan notes found for this encounter.     Lavonda Jumbo, DO [facility]

## 2021-09-26 NOTE — Progress Notes (Signed)
° ° °  SUBJECTIVE:   CHIEF COMPLAINT / HPI:   Here with mother.   Diabetes Current Regimen: Farxiga 10 mg daily CBGs: 100, checks every other day  Last A1c: 4.9 on 06/29/2021  Denies polyuria, polydipsia, hypoglycemia Statin: Atorvastatin 40 mg daily ACE/ARB: Losartan 25 mg qhs  Hypertension - Medications: Losartan 25 mg qhs,  - Compliance: Yes - Checking BP at home: Yes, 150-170/80s - Denies any SOB, CP, vision changes, LE edema, medication SEs, or symptoms of hypotension  PERTINENT  PMH / PSH: HLD, seizure disorder (annual visits with neurology), intellectual disability, congenital left hemiparesis  OBJECTIVE:   BP 129/90    Pulse 79    Wt 146 lb 9.6 oz (66.5 kg)    SpO2 100%    BMI 26.81 kg/m   Physical Exam Vitals reviewed.  Cardiovascular:     Rate and Rhythm: Normal rate and regular rhythm.     Heart sounds: Normal heart sounds.  Pulmonary:     Effort: Pulmonary effort is normal.     Breath sounds: Normal breath sounds.  Neurological:     Mental Status: She is alert and oriented to person, place, and time.  Psychiatric:        Mood and Affect: Mood normal.        Behavior: Behavior normal.     ASSESSMENT/PLAN:   1. Diabetes mellitus without complication (HCC) 123456 today 5.0. Well controlled.  Discontinue farxiga. Follow up in 3 month for repeat A1c.   2. Essential hypertension At goal today. If continues to be elevated at home may need to bring home cuff into a visit to compare and then make medication adjustments. Continue current medication dose.      Gerlene Fee, [place]

## 2021-09-29 ENCOUNTER — Encounter: Payer: Self-pay | Admitting: Adult Health

## 2021-09-29 ENCOUNTER — Ambulatory Visit (INDEPENDENT_AMBULATORY_CARE_PROVIDER_SITE_OTHER): Payer: Medicare Other | Admitting: Adult Health

## 2021-09-29 VITALS — BP 127/83 | HR 79 | Wt 148.0 lb

## 2021-09-29 DIAGNOSIS — G40909 Epilepsy, unspecified, not intractable, without status epilepticus: Secondary | ICD-10-CM | POA: Diagnosis not present

## 2021-09-29 DIAGNOSIS — Z5181 Encounter for therapeutic drug level monitoring: Secondary | ICD-10-CM | POA: Diagnosis not present

## 2021-09-29 MED ORDER — TEGRETOL 200 MG PO TABS: ORAL_TABLET | ORAL | 11 refills | Status: DC

## 2021-09-29 MED ORDER — TOPAMAX 25 MG PO TABS: 75.0000 mg | ORAL_TABLET | Freq: Two times a day (BID) | ORAL | 3 refills | Status: DC

## 2021-09-29 MED ORDER — DEPAKOTE ER 250 MG PO TB24: 500.0000 mg | ORAL_TABLET | Freq: Two times a day (BID) | ORAL | 3 refills | Status: DC

## 2021-09-29 NOTE — Patient Instructions (Signed)
No changes today - continue current medication regimen       Thank you for coming to see [place] at [facility]. I hope we have been able to provide you high quality care today.  You may receive a patient satisfaction survey over the next few weeks. We would appreciate your feedback and comments so that we may continue to improve ourselves and the health of our patients.

## 2021-09-29 NOTE — Progress Notes (Signed)
PATIENT: Patricia White DOB: 1971-05-08  REASON FOR VISIT: Seizure follow up HISTORY FROM: patient  Chief Complaint  Patient presents with   Follow-up    RM 3 with aunt Patricia White Pt is well, aunt states no known sz since last visit but has been jerking a lot lately.      HISTORY OF PRESENT ILLNESS:  Update 09/29/2021 JM: Returns for 1 year seizure follow-up accompanied by her aunt.  Overall stable without any seizure activity.  Compliant on Depakote, Tegretol and Topamax without side effects.  Requires brand-name as she has failed generics. Does report some increased extremity jerking in the morning which can be typical with colder weather.  No new concerns at this time.    History provided for reference purposes only Update 09/23/2020 JM: Patricia White returns for 16-monthseizure follow-up accompanied by her aunt.  Stable since prior visit without any additional seizure activity.  Remains on Depakote, Tegretol and Topamax tolerating without side effects all brand-name prescriptions as generics do not control her seizures. Will repeat lab work today.  No concerns at this time.  Update 03/22/2020 JM: Ms. VAlviarreturns for seizure follow-up accompanied by her aunt. ED evaluation on 09/14/2019 for suspected seizure with complaints of increased right-sided weakness and decreased ability to speak with abrasion noted on the side of her tongue.  Stroke/TIA ruled out.  No medication changes recommended.  Symptoms rapidly resolved and work-up unremarkable and discharged home. She has not had any reoccurring seizure type symptoms or events.  Remains on Depakote ER 500 mg twice daily, Tegretol 300 mg twice daily and Topamax 75 mg twice daily.  Tolerating well without side effects.  Prior lab work for therapeutic drug level monitoring 6 months ago satisfactory.  No concerns at this time.  Update 09/09/2019 AL: Patricia EHRESMANis a 51y.o. female here today for follow up for seizures. She presents today  with her grandmother who aids in history. Her grandmother reports that overall she is doing very well. She continues Depakote ER 500 mg twice daily, Tegretol 300 mg twice daily and Topamax 75 mg twice daily. She is tolerating medications well with no obvious adverse effects. Her grandmother denies any recent seizure activity. Last seizure in 07/2018. She does continue to note intermittent jerking of her upper and lower extremities. Jerking typically last a couple of seconds. NChrissieis able to communicate during these events. There is no tonic-clonic movement. No tongue injuries or incontinence. Jerking has been persistent for years. 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 Her grandmother also notes that over the last couple days, she has noted very mild swelling of patient's right cheek and left upper lip. There has been no difficulty eating or swallowing. No respiratory difficulty. No other rash or swelling.  [name] able to dress herself. She does require assistance with bathing. She does not cook or drive. She lives with her grandmother who is primary caregiver as her mother passed away about 8 years ago. She has not been seen by PCP recently. She does not usually have elevated blood pressures per grandmother. She is doing well today and without concerns.   Virtual visit update 02/05/2019 JM: Patricia White a 51year old female who is being seen today for follow-up for seizure management with underlying history of seizure disorder, moderate MR, congenital left hemiparesis abnormality of gait and history of cerebral palsy.  Initially scheduled for in office face-to-face visit but due to COVID-19 safety precautions,  visit transition to telemedicine with patient's and grandmothers consent.     Grandmother reports "jerking and twitching spells" recently over the past 2 weeks and is questioning whether she needs dosage increase of her medications.  Per review of prior office  visits, reports of myoclonus has been discussed previously.  Occurs approximately 1-2 times weekly. denies seizure activity with loss of consciousness.  Denies any recent changes to medications, diet, activity level or increased stressors.  Grandmother also endorses increased fatigue level and recent weight gain.  Recent weight 158lb and previous weight 6 months prior 176lb.  She reports ongoing compliance with valproate, carbamazepine and topiramate and denies any side effects.  Blood work obtained at prior visit which were all satisfactory.  No further concerns at this time.   08/05/2018 visit JM: Patient is being seen today due to seizure at home on 07/08/2018.  She is accompanied by her grandmother.  She did have a second seizure with EMS while being transported to ED.  Per ED notes, she continue to take valproic acid, carbamazepine and topiramate but had run out of her carbamazepine 3 days prior.  It was felt as though her seizure was likely running out of her carbamazepine as labs confirmed undetectable level.  She has not had any seizure activity in the past 4 years.  Valproic acid level 45. Since discharge home, denies any recent seizure activity. She continues on valproic acid, carbamazepine and topiramate without side effects. 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 She only wears this brace when she is out of the house.  Denies any recent falls.  Grandmother is concerned as her last menses was in October without menses in November or December. She is typically regular.  No further concerns at this time.   REVIEW OF SYSTEMS: Out of a complete 14 system review of symptoms, the patient complains only of the following symptoms as described in HPI and all other reviewed systems are negative.  ALLERGIES: No Known Allergies  HOME MEDICATIONS: Outpatient Medications Prior to Visit  Medication Sig Dispense Refill   Accu-Chek FastClix Lancets MISC 1 Device  by Does not apply route as directed. 102 each 1   atorvastatin (LIPITOR) 40 MG tablet Take 1 tablet (40 mg total) by mouth daily. 90 tablet 3   DEPAKOTE ER 250 MG 24 hr tablet Take 2 tablets (500 mg total) by mouth 2 (two) times daily. 360 tablet 3   levocetirizine (XYZAL) 5 MG tablet TAKE 1 TABLET BY MOUTH 2 TIMES DAILY 60 tablet 5   losartan (COZAAR) 25 MG tablet Take 1 tablet (25 mg total) by mouth at bedtime. 90 tablet 3   potassium chloride (KLOR-CON) 10 MEQ tablet TAKE 1 TABLET BY MOUTH EVERY DAY 30 tablet 0   TEGRETOL 200 MG tablet TAKE ONE AND A HALF TABLETS BY MOUTH 2 TIMES DAILY EVERY MORNING AND AT BEDTIME 90 tablet 11   TOPAMAX 25 MG tablet Take 3 tablets (75 mg total) by mouth 2 (two) times daily. 540 tablet 3   Blood Glucose Monitoring Suppl (ACCU-CHEK GUIDE) w/Device KIT 1 each by Does not apply route as directed. (Patient not taking: Reported on 09/29/2021) 1 kit 1   EPINEPHrine 0.3 mg/0.3 mL IJ SOAJ injection Inject 0.3 mg into the muscle as needed for anaphylaxis. (Patient not taking: Reported on 09/29/2021) 2 each 3   glucose blood (ACCU-CHEK GUIDE) test strip Use as instructed (Patient not taking: Reported on 09/29/2021) 100 each 12  Lancets Misc. (ACCU-CHEK FASTCLIX LANCET) KIT 1 kit by Does not apply route as directed. (Patient not taking: Reported on 09/29/2021) 1 kit 1   Facility-Administered Medications Prior to Visit  Medication Dose Route Frequency Provider Last Rate Last Admin   omalizumab Arvid Right) prefilled syringe 300 mg  300 mg Subcutaneous Q28 days Kennith Gain, MD   300 mg at 10/21/20 1331   omalizumab Arvid Right) prefilled syringe 300 mg  300 mg Subcutaneous Q28 days Kennith Gain, MD   300 mg at 11/19/20 1559    PAST MEDICAL HISTORY: Past Medical History:  Diagnosis Date   Asthma    Cerebral palsy ([place]) Birth   Mental retardation Birth   Seizures ([place])     PAST SURGICAL HISTORY: No past surgical history on file.  FAMILY  HISTORY: Family History  Problem Relation Age of Onset   Migraines Mother    Cancer Mother        liver cancer   Diabetes Maternal Aunt    Hypertension Maternal Grandmother    Colon cancer Maternal Grandfather 60   Prostate cancer Maternal Grandfather    Throat cancer Maternal Grandfather     SOCIAL HISTORY: Social History   Socioeconomic History   Marital status: Single    Spouse name: Not on file   Number of children: 0   Years of education: dis.school   Highest education level: Not on file  Occupational History    Comment: disabled0  Tobacco Use   Smoking status: Never   Smokeless tobacco: Never  Vaping Use   Vaping Use: Never used  Substance and Sexual Activity   Alcohol use: No   Drug use: No   Sexual activity: Never  Other Topics Concern   Not on file  Social History Narrative   Patient is right handed      Current Social History 04/13/2017           Patient lives with grandmother([name] Glean Salvo), maternal aunt Angelisa Winthrop), female cousin and maternal uncle in one level home 04/13/2017   Transportation: Patient relies on aunt for transportation 04/13/2017   Important Relationships Family 04/13/2017    Pets: Rose, "Harmony" 04/13/2017   Education / Work:  12 th grade/ Disabled 04/13/2017   Interests / Fun: Going to [employer], out for pizza, scary movies 04/13/2017   Religious / Personal Beliefs: Attends Non-Denominational Church 04/13/2017   L. Ducatte, RN, BSN                                                                                                 Social Determinants of Health   Financial Resource Strain: Not on file  Food Insecurity: Not on file  Transportation Needs: Not on file  Physical Activity: Not on file  Stress: Not on file  Social Connections: Not on file  Intimate Partner Violence: Not on file      PHYSICAL EXAM  Vitals:   09/29/21 1045  BP: 127/83  Pulse: 79  Weight: 148 lb (67.1 kg)    Body mass index is 27.07  kg/m.  Generalized: Well developed, pleasant middle-aged African-American female,  in no acute distress  Cardiology: normal rate and rhythm, no murmur noted  Neurological examination  Mentation: Alert. Follows all commands and cooperative with exam.  Limited verbal output with majority of history provided by aunt Cranial nerve II-XII: Pupils were equal round reactive to light. Extraocular movements were full, visual field were full on confrontational test. Facial sensation and strength were normal. Uvula tongue midline. Head turning and shoulder shrug  were normal and symmetric. Motor: The motor testing reveals 5 over 5 strength of right upper and lower extremity.  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 No evidence of extinction is noted.  Coordination: Cerebellar testing finger-nose-finger and heel-to-shin performed accurately on right side Gait and station: spastic, left hemiplegic gait, stable without assistive device.  Reflexes: Deep tendon reflexes slightly increased left side    DIAGNOSTIC DATA (LABS, IMAGING, TESTING) - I reviewed patient records, labs, notes, testing and imaging myself where available.  No flowsheet data found.   Lab Results  Component Value Date   WBC 5.3 08/25/2020   HGB 11.7 (L) 08/25/2020   HCT 35.1 (L) 08/25/2020   MCV 90.5 08/25/2020   PLT 310 08/25/2020      Component Value Date/Time   NA 139 03/03/2021 1537   K 4.0 03/03/2021 1537   CL 99 03/03/2021 1537   CO2 21 03/03/2021 1537   GLUCOSE 75 03/03/2021 1537   GLUCOSE 108 (H) 08/25/2020 1642   BUN 10 03/03/2021 1537   CREATININE 0.80 03/03/2021 1537   CALCIUM 9.0 03/03/2021 1537   PROT 8.5 03/03/2021 1537   ALBUMIN 4.2 03/03/2021 1537   AST 11 03/03/2021 1537   ALT 7 03/03/2021 1537   ALKPHOS 97 03/03/2021 1537   BILITOT <0.2 03/03/2021 1537   GFRNONAA >60 08/25/2020 1642   GFRAA >60  01/19/2020 2148   Lab Results  Component Value Date   CHOL 220 (H) 11/23/2020   HDL 52 11/23/2020   LDLCALC 121 (H) 11/23/2020   TRIG 272 (H) 11/23/2020   CHOLHDL 4.2 11/23/2020   Lab Results  Component Value Date   HGBA1C 5.0 09/22/2021   No results found for: VITAMINB12 No results found for: TSH     ASSESSMENT AND PLAN 51 y.o. year old female  has a past medical history of Asthma, Cerebral palsy ([place]) (Birth), Mental retardation ([place]irth), and Seizures ([place]). here with    Seizures -Continue Depakote, Tegretol and Topamax for seizure prophylaxis - refills provided - needs to continue brand medications due to history of breakthrough seizures on generic -Lab work: Valproic acid, and carbamazepine level - CBC and CMP completed 02/2021 -advised aunt to call office with any potential breakthrough seizures    Follow-up in 1 year or call earlier if needed    CC:  Autry-Lott, Simone, DO    I spent 21 minutes of face-to-face and non-face-to-face time with patient and aunt.  This included previsit chart review, lab review, study review, order entry, electronic health record documentation, patient/family education regarding history of seizures, ongoing use of AEDs and indication for lab work, avoiding seizure triggers and answered all questions to patient and aunt satisfaction  Frann Rider, AGNP-BC  [facility] [address] [place], [place] [address]  Phone [phone] Fax [phone] Note: This document was prepared with digital dictation and possible smart phrase technology. Any transcriptional errors that result from this process are unintentional.

## 2021-09-30 LAB — CARBAMAZEPINE LEVEL, TOTAL: Carbamazepine (Tegretol), S: 6.3 ug/mL (ref 4.0–12.0)

## 2021-09-30 LAB — VALPROIC ACID LEVEL: Valproic Acid Lvl: 58 ug/mL (ref 50–100)

## 2021-10-17 ENCOUNTER — Other Ambulatory Visit: Payer: Self-pay | Admitting: Allergy

## 2021-10-18 ENCOUNTER — Other Ambulatory Visit: Payer: Self-pay | Admitting: *Deleted

## 2021-10-18 DIAGNOSIS — I10 Essential (primary) hypertension: Secondary | ICD-10-CM

## 2021-10-19 MED ORDER — LOSARTAN POTASSIUM 25 MG PO TABS: 25.0000 mg | ORAL_TABLET | Freq: Every day | ORAL | 3 refills | Status: DC

## 2021-10-20 ENCOUNTER — Telehealth: Payer: Self-pay | Admitting: Adult Health

## 2021-10-20 ENCOUNTER — Other Ambulatory Visit: Payer: Self-pay | Admitting: Family Medicine

## 2021-10-20 DIAGNOSIS — E876 Hypokalemia: Secondary | ICD-10-CM

## 2021-10-20 NOTE — Telephone Encounter (Signed)
Called Patricia White, advised her NP refilled all three drugs on 09/29/21. She stated pharmacy told her they don't have Rx. I advised her I'll take care of it today. ?Called pharmacy spoke with Joni Reining who stated they have all three Rx, but it is about 10 days too early to refill. They get 90 day supply with each refill.   ?[employer] and advised of above. She stated patient will run out of meds before 10 days. I requested she get bottles. Topamax stated dispensed #540; she counted and there are 140 left. She counted depakote, 68 tabs left. I advised she has enough of those to last 10 days. She stated she has no tegretol left, dispense quantity #270. I advised her that must be old bottle, last Rx was for one month supply of #90. She stated she can't find any other bottles of tegretol. Patient last took tegretol Tues and she had light seizure Wed.  I advised will call pharmacy again and call her back.  ?Called and spoke with Joni Reining who stated they last gave her #270. She was able to get refill to run through.  I advised will let aunt know she can pick up today. Joni Reining  verbalized understanding, appreciation. ?Called Patricia White and advised her. Advised she give patient tegretol today and call us for any further seizure activity.  She verbalized understanding, appreciation. ? ?

## 2021-10-20 NOTE — Telephone Encounter (Signed)
Pt's aunt, Hetty Voeller request refills for TOPAMAX 25 MG tablet and TEGRETOL 200 MG tablet and DEPAKOTE ER 250 MG 24 hr tablet at [place] ?

## 2021-10-20 NOTE — Telephone Encounter (Signed)
Noted! Thank you

## 2021-10-24 NOTE — Telephone Encounter (Signed)
Patient needs to have potassium checked soon.  ?

## 2022-01-17 ENCOUNTER — Encounter: Payer: Self-pay | Admitting: *Deleted

## 2022-01-24 NOTE — Telephone Encounter (Signed)
Contacted pt regarding pharmacy, no answer

## 2022-01-27 ENCOUNTER — Other Ambulatory Visit: Payer: Self-pay

## 2022-01-27 DIAGNOSIS — E876 Hypokalemia: Secondary | ICD-10-CM

## 2022-01-31 NOTE — Telephone Encounter (Signed)
LVM for patient to call and make an appointment.  .Tex Conroy R Torah Pinnock, CMA  

## 2022-02-15 ENCOUNTER — Telehealth: Payer: Self-pay

## 2022-02-15 NOTE — Telephone Encounter (Signed)
Received phone call from Gar Gibbon, NP with [facility] regarding patient having elevated BP readings.   Today, 7/5- 148/102 Yesterday, 7/4- 170/100  Patient is asymptomatic and has been taking losartan.   Scheduled OV with PCP on Friday afternoon at 1:45 pm.   Provided with red flags and ED precautions.   FYI to PCP.   Veronda Prude, RN

## 2022-02-16 NOTE — Progress Notes (Signed)
    SUBJECTIVE:   CHIEF COMPLAINT / HPI:   Ms. Patricia White is 51 y.o. female who presented today to clinic for blood pressure follow-up and complaint generalized weakness. History provided by patient and grandmother.  Blood pressure Patient messaged clinic earlier this week about elevated blood pressure and was told to make appointment today. At home BP elevated to 170/100 with some nausea. Today she denies chest pain shortness of breath, or difficulty seeing. Took blood pressure medication today.   Generalized weakness Patient reports 52month history of increased weakness with fallsx2. Before this time patient was able ambulate on her own, and did not fall. Reports she did not hit her head, and had help getting back up. Denies any recent seizure-like activity, and has had good oral intake.  PERTINENT  PMH / PSH: Essential Hypertension, Cerebral palsy, Seizure disorder  OBJECTIVE:   BP (!) 162/100   Pulse 98   SpO2 98%   General: A&O, NAD, sitting comfortably in wheelchair HEENT: No sign of trauma, EOM grossly intact Cardiac: RRR, no m/r/g, radial pulses 2+ bilaterally Respiratory: CTAB, normal WOB, no w/c/r Neuro: Memory: Intact . PEERLA. Cranial nerves: III-VI intact Right upper and lower extremity strength 5/5, left upper and lower extremity strength 4/5. Patient attempted to stand up, but required assistance to remain standing.  Psych: Appropriate mood and affect     ASSESSMENT/PLAN:   Essential hypertension Blood pressure has been elevated over past week at home up 170/100. Elevated in clinic to 162/100. Patient was symptomatic with nausea at home. Asymptomatic here in clinic. -Increased home Losartan to 50mg  nightly -Instructed to measure at home and follow-up in 1 week.  Seizure disorder [facility]) Patient has had recent fallsx2 at home with increased "jerks" reported but no generalized shaking. Additionally, has increased generalized weakness for 1 month. Suspect deconditioning  based on strength exam and no focal abnormalities -Rule out electrolyte derangements with BMP -Rule out anemia with CBC -Rule hypo/hyperthyroidism with TSH -Referral to physical therapy -Instructed to make appointment with her neurologist     [facility], MD [facility]

## 2022-02-16 NOTE — Patient Instructions (Addendum)
It was great to see you! Thank you for allowing me to participate in your care!  I recommend that you always bring your medications to each appointment as this makes it easy to ensure we are on the correct medications and helps [place] not miss when refills are needed.  Our plans for today:  - For your elevated blood pressure we are going to increase your Cozaar to 50mg  twice daily - For your weakness and falls, we have ordered several labs, and sent a referral for physical therapy. They will call you.  - I would like to see you in clinic in 1-2 weeks. - Please call your Neurologist for a follow-up appointment at this number [phone]   We are checking some labs today, I will call you if they are abnormal will send you a MyChart message or a letter if they are normal.  If you do not hear about your labs in the next 2 weeks please let [phone] know.  Take care and seek immediate care sooner if you develop any concerns.   Dr. [place], MD [facility]

## 2022-02-17 ENCOUNTER — Encounter: Payer: Self-pay | Admitting: Family Medicine

## 2022-02-17 ENCOUNTER — Ambulatory Visit (INDEPENDENT_AMBULATORY_CARE_PROVIDER_SITE_OTHER): Payer: Medicare Other | Admitting: Family Medicine

## 2022-02-17 VITALS — BP 162/100 | HR 98

## 2022-02-17 DIAGNOSIS — I10 Essential (primary) hypertension: Secondary | ICD-10-CM | POA: Diagnosis not present

## 2022-02-17 DIAGNOSIS — G40909 Epilepsy, unspecified, not intractable, without status epilepticus: Secondary | ICD-10-CM

## 2022-02-17 MED ORDER — LOSARTAN POTASSIUM 50 MG PO TABS: 50.0000 mg | ORAL_TABLET | Freq: Every day | ORAL | 1 refills | Status: DC

## 2022-02-17 NOTE — Assessment & Plan Note (Addendum)
Patient has had recent fallsx2 at home with increased "jerks" reported but no generalized shaking. Additionally, has increased generalized weakness for 1 month. Suspect deconditioning based on strength exam and no focal abnormalities -Rule out electrolyte derangements with BMP -Rule out anemia with CBC -Rule hypo/hyperthyroidism with TSH -Referral to physical therapy -Instructed to make appointment with her neurologist

## 2022-02-17 NOTE — Assessment & Plan Note (Signed)
Blood pressure has been elevated over past week at home up 170/100. Elevated in clinic to 162/100. Patient was symptomatic with nausea at home. Asymptomatic here in clinic. -Increased home Losartan to 50mg  nightly -Instructed to measure at home and follow-up in 1 week.

## 2022-02-18 LAB — CBC WITH DIFFERENTIAL/PLATELET
Basophils Absolute: 0 10*3/uL (ref 0.0–0.2)
Basos: 1 %
EOS (ABSOLUTE): 0.2 10*3/uL (ref 0.0–0.4)
Eos: 3 %
Hematocrit: 35.4 % (ref 34.0–46.6)
Hemoglobin: 11.6 g/dL (ref 11.1–15.9)
Immature Grans (Abs): 0 10*3/uL (ref 0.0–0.1)
Immature Granulocytes: 0 %
Lymphocytes Absolute: 2.4 10*3/uL (ref 0.7–3.1)
Lymphs: 45 %
MCH: 30.1 pg (ref 26.6–33.0)
MCHC: 32.8 g/dL (ref 31.5–35.7)
MCV: 92 fL (ref 79–97)
Monocytes Absolute: 0.4 10*3/uL (ref 0.1–0.9)
Monocytes: 7 %
Neutrophils Absolute: 2.3 10*3/uL (ref 1.4–7.0)
Neutrophils: 44 %
Platelets: 225 10*3/uL (ref 150–450)
RBC: 3.86 x10E6/uL (ref 3.77–5.28)
RDW: 12.5 % (ref 11.7–15.4)
WBC: 5.2 10*3/uL (ref 3.4–10.8)

## 2022-02-18 LAB — COMPREHENSIVE METABOLIC PANEL
ALT: 7 IU/L (ref 0–32)
AST: 9 IU/L (ref 0–40)
Albumin/Globulin Ratio: 1 — ABNORMAL LOW (ref 1.2–2.2)
Albumin: 4 g/dL (ref 3.8–4.8)
Alkaline Phosphatase: 92 IU/L (ref 44–121)
BUN/Creatinine Ratio: 17 (ref 9–23)
BUN: 13 mg/dL (ref 6–24)
Bilirubin Total: <0.2 mg/dL (ref 0.0–1.2)
CO2: 23 mmol/L (ref 20–29)
Calcium: 9.1 mg/dL (ref 8.7–10.2)
Chloride: 102 mmol/L (ref 96–106)
Creatinine, Ser: 0.76 mg/dL (ref 0.57–1.00)
Globulin, Total: 4.2 g/dL (ref 1.5–4.5)
Glucose: 96 mg/dL (ref 70–99)
Potassium: 3.8 mmol/L (ref 3.5–5.2)
Sodium: 138 mmol/L (ref 134–144)
Total Protein: 8.2 g/dL (ref 6.0–8.5)
eGFR: 96 mL/min/{1.73_m2} (ref >59)

## 2022-02-18 LAB — TSH RFX ON ABNORMAL TO FREE T4: TSH: 10.3 u[IU]/mL — ABNORMAL HIGH (ref 0.450–4.500)

## 2022-02-18 LAB — T4F: T4,Free (Direct): 0.84 ng/dL (ref 0.82–1.77)

## 2022-02-20 ENCOUNTER — Telehealth: Payer: Self-pay | Admitting: Family Medicine

## 2022-02-20 NOTE — Telephone Encounter (Signed)
Spoke with patient's legal guardian, Marylouise Mallet. I explained that the patients TSH was elevated but T4 was not, and that subclinical hypothyroidism could explain the patient's increased symptoms over past month. Recommended that we recheck her TSH at her next follow up visit before considering Levothyroxine.

## 2022-02-21 ENCOUNTER — Other Ambulatory Visit: Payer: Self-pay | Admitting: Family Medicine

## 2022-02-21 DIAGNOSIS — G40909 Epilepsy, unspecified, not intractable, without status epilepticus: Secondary | ICD-10-CM

## 2022-02-21 DIAGNOSIS — G808 Other cerebral palsy: Secondary | ICD-10-CM

## 2022-02-27 ENCOUNTER — Ambulatory Visit: Payer: Self-pay | Admitting: Physical Therapy

## 2022-03-02 ENCOUNTER — Emergency Department (HOSPITAL_COMMUNITY): Payer: Medicare Other

## 2022-03-02 ENCOUNTER — Encounter (HOSPITAL_COMMUNITY): Payer: Self-pay | Admitting: Emergency Medicine

## 2022-03-02 ENCOUNTER — Emergency Department (HOSPITAL_COMMUNITY)
Admission: EM | Admit: 2022-03-02 | Discharge: 2022-03-03 | Disposition: A | Discharge status: Home / Self Care | Payer: Medicare Other | Attending: Emergency Medicine | Admitting: Emergency Medicine

## 2022-03-02 ENCOUNTER — Other Ambulatory Visit: Payer: Self-pay

## 2022-03-02 DIAGNOSIS — R079 Chest pain, unspecified: Secondary | ICD-10-CM | POA: Insufficient documentation

## 2022-03-02 DIAGNOSIS — R1013 Epigastric pain: Secondary | ICD-10-CM | POA: Diagnosis not present

## 2022-03-02 DIAGNOSIS — R112 Nausea with vomiting, unspecified: Secondary | ICD-10-CM | POA: Insufficient documentation

## 2022-03-02 DIAGNOSIS — I10 Essential (primary) hypertension: Secondary | ICD-10-CM | POA: Diagnosis not present

## 2022-03-02 DIAGNOSIS — H538 Other visual disturbances: Secondary | ICD-10-CM | POA: Diagnosis not present

## 2022-03-02 DIAGNOSIS — R531 Weakness: Secondary | ICD-10-CM | POA: Insufficient documentation

## 2022-03-02 DIAGNOSIS — R109 Unspecified abdominal pain: Secondary | ICD-10-CM | POA: Diagnosis not present

## 2022-03-02 DIAGNOSIS — R1110 Vomiting, unspecified: Secondary | ICD-10-CM | POA: Diagnosis not present

## 2022-03-02 DIAGNOSIS — R569 Unspecified convulsions: Secondary | ICD-10-CM | POA: Diagnosis not present

## 2022-03-02 LAB — COMPREHENSIVE METABOLIC PANEL
ALT: 9 U/L (ref 0–44)
AST: 15 U/L (ref 15–41)
Albumin: 3.4 g/dL — ABNORMAL LOW (ref 3.5–5.0)
Alkaline Phosphatase: 81 U/L (ref 38–126)
Anion gap: 7 (ref 5–15)
BUN: 11 mg/dL (ref 6–20)
CO2: 24 mmol/L (ref 22–32)
Calcium: 8.8 mg/dL — ABNORMAL LOW (ref 8.9–10.3)
Chloride: 106 mmol/L (ref 98–111)
Creatinine, Ser: 0.89 mg/dL (ref 0.44–1.00)
GFR, Estimated: >60 mL/min (ref >60)
Glucose, Bld: 104 mg/dL — ABNORMAL HIGH (ref 70–99)
Potassium: 3.4 mmol/L — ABNORMAL LOW (ref 3.5–5.1)
Sodium: 137 mmol/L (ref 135–145)
Total Bilirubin: 0.5 mg/dL (ref 0.3–1.2)
Total Protein: 8.4 g/dL — ABNORMAL HIGH (ref 6.5–8.1)

## 2022-03-02 LAB — VALPROIC ACID LEVEL: Valproic Acid Lvl: 72 ug/mL (ref 50.0–100.0)

## 2022-03-02 LAB — CBC WITH DIFFERENTIAL/PLATELET
Abs Immature Granulocytes: 0.01 10*3/uL (ref 0.00–0.07)
Basophils Absolute: 0 10*3/uL (ref 0.0–0.1)
Basophils Relative: 0 %
Eosinophils Absolute: 0.1 10*3/uL (ref 0.0–0.5)
Eosinophils Relative: 1 %
HCT: 36.7 % (ref 36.0–46.0)
Hemoglobin: 12 g/dL (ref 12.0–15.0)
Immature Granulocytes: 0 %
Lymphocytes Relative: 29 %
Lymphs Abs: 1.4 10*3/uL (ref 0.7–4.0)
MCH: 30.4 pg (ref 26.0–34.0)
MCHC: 32.7 g/dL (ref 30.0–36.0)
MCV: 92.9 fL (ref 80.0–100.0)
Monocytes Absolute: 0.3 10*3/uL (ref 0.1–1.0)
Monocytes Relative: 6 %
Neutro Abs: 3.1 10*3/uL (ref 1.7–7.7)
Neutrophils Relative %: 64 %
Platelets: 216 10*3/uL (ref 150–400)
RBC: 3.95 MIL/uL (ref 3.87–5.11)
RDW: 12.1 % (ref 11.5–15.5)
WBC: 4.9 10*3/uL (ref 4.0–10.5)
nRBC: 0 % (ref 0.0–0.2)

## 2022-03-02 LAB — LIPASE, BLOOD: Lipase: 36 U/L (ref 11–51)

## 2022-03-02 MED ORDER — IOHEXOL 350 MG/ML SOLN
90.0000 mL | Freq: Once | INTRAVENOUS | Status: AC | PRN
  Administered 2022-03-02: 90 mL via INTRAVENOUS

## 2022-03-02 MED ORDER — LORAZEPAM 1 MG PO TABS
1.0000 mg | ORAL_TABLET | ORAL | Status: DC | PRN
  Administered 2022-03-02: 1 mg via ORAL
  Filled 2022-03-02: qty 1

## 2022-03-02 MED ORDER — SODIUM CHLORIDE 0.9 % IV BOLUS
1000.0000 mL | Freq: Once | INTRAVENOUS | Status: AC
  Administered 2022-03-02: 1000 mL via INTRAVENOUS

## 2022-03-02 NOTE — ED Notes (Signed)
Patient watching TV, eating a snack, family at bedside. Awaiting ED provider update to family.

## 2022-03-02 NOTE — ED Notes (Signed)
Patient transported to MRI 

## 2022-03-02 NOTE — ED Provider Notes (Signed)
4:05 AM Assumed care from Dr. Rodena Medin and PA - Greta Doom, please see their note for full history, physical and decision making until this point. In brief this is a 51 y.o. year old female who presented to the ED tonight with No chief complaint on file.     Pending MRI 2/2 questionable ataxia and difficulty ambulating. May need PT/OT if discharge.   Workup reassuring, urine culture sent. Took quite a bit of oral and IVF to get urine, wonder if dehydration is causing some of this. Either way, will engage TOC for possible home PT/OT consult. D/w mother. Abdomen benign. Tolerating po. Stable for discharge.   Discharge instructions, including strict return precautions for new or worsening symptoms, given. Patient and/or family verbalized understanding and agreement with the plan as described.   Labs, studies and imaging reviewed by myself and considered in medical decision making if ordered. Imaging interpreted by radiology.  Labs Reviewed  COMPREHENSIVE METABOLIC PANEL - Abnormal; Notable for the following components:      Result Value   Potassium 3.4 (*)    Glucose, Bld 104 (*)    Calcium 8.8 (*)    Total Protein 8.4 (*)    Albumin 3.4 (*)    All other components within normal limits  URINALYSIS, ROUTINE W REFLEX MICROSCOPIC - Abnormal; Notable for the following components:   Color, Urine STRAW (*)    Leukocytes,Ua LARGE (*)    Bacteria, UA RARE (*)    All other components within normal limits  URINE CULTURE  CBC WITH DIFFERENTIAL/PLATELET  LIPASE, BLOOD  VALPROIC ACID LEVEL    MR BRAIN WO CONTRAST  Final Result    CT Head Wo Contrast  Final Result    CT ABDOMEN PELVIS W CONTRAST  Final Result      No follow-ups on file.    Aleksandar Duve, Barbara Cower, MD 03/03/22 [phone]

## 2022-03-02 NOTE — ED Notes (Signed)
Patient transported to CT 

## 2022-03-02 NOTE — ED Triage Notes (Signed)
Pt arrives via EMS for weakness for a week, and n/v. Pt was able to eat breakfast. HR 80 98% room air, BP 142/76 Pt has cognitive difficulties but is alert and follows directions per EMS.

## 2022-03-02 NOTE — ED Provider Triage Note (Signed)
Emergency Medicine Provider Triage Evaluation Note  KASHAYLA UNGERER , a 51 y.o. female  was evaluated in triage.  Pt complains of abdominal pain, nausea, vomiting since last night.  No additional episodes of vomiting today.  Still reports abdominal discomfort.  Patient does have history of developmental delay.  Is accompanied by family member at bedside.  Also tachycardic.  Review of Systems  Positive: As above Negative: As above  Physical Exam  BP (!) 151/104 (BP Location: Right Arm)   Pulse (!) 105   Temp 97.9 F (36.6 C)   Resp 17   SpO2 100%  Gen:   Awake, no distress   Resp:  Normal effort  MSK:   Moves extremities without difficulty Other:    Medical Decision Making  Medically screening exam initiated at 2:32 PM.  Appropriate orders placed.  JOUD PETTINATO was informed that the remainder of the evaluation will be completed by another provider, this initial triage assessment does not replace that evaluation, and the importance of remaining in the ED until their evaluation is complete.     Marita [place], PA-C 03/02/22 1433

## 2022-03-02 NOTE — ED Provider Notes (Signed)
[facility] EMERGENCY DEPARTMENT Provider Note   CSN: [id number] Arrival date & time: 03/02/22  1419     History  No chief complaint on file.   Patricia White is a 51 y.o. female.  The history is provided by a relative and medical records. No language interpreter was used.       Home Medications 51 yo female w/ hx of HTN, seizures, cerebral palsy, presenting to the ED for sudden onset of generalized weakness and 1 episode of vomiting yesterday. Patient provided limited history, history mostly provided by her grandmother at bedside. Per family member, patient was eating dinner and subsequently vomited afterwards. Family member also noted some jerking motions yesterday close to the time patient vomited. This morning, patient was unable to walk to get her food, which alerted family member to bring her to the ED. Family member report that she has been feeling progressively weak for the past 1 month, having fallen twice. Patients endorse subxiphoid/epigastric pain, chest pain, blurry vision, and abdominal pain. Denies SOB, dyspnea, backpain.       Allergies    Patient has no known allergies.    Review of Systems   Review of Systems  All other systems reviewed and are negative.   Physical Exam Updated Vital Signs BP (!) 155/97   Pulse 87   Temp 98.3 F (36.8 C) (Oral)   Resp 18   SpO2 100%  Physical Exam Vitals and nursing note reviewed.  Constitutional:      General: She is not in acute distress.    Appearance: She is well-developed.     Comments: Chronically ill-appearing female appears to be in no acute discomfort.  HENT:     Head: Atraumatic.  Eyes:     Extraocular Movements: Extraocular movements intact.     Conjunctiva/sclera: Conjunctivae normal.     Pupils: Pupils are equal, round, and reactive to light.  Cardiovascular:     Rate and Rhythm: Normal rate and regular rhythm.     Pulses: Normal pulses.     Heart sounds: Normal heart sounds.   Pulmonary:     Effort: Pulmonary effort is normal.  Abdominal:     Palpations: Abdomen is soft.     Tenderness: There is no abdominal tenderness.  Musculoskeletal:     Cervical back: Neck supple.     Comments: Able to move all 4 extremities with poor effort.  Leg splint to left neck to prevent foot drop  Skin:    Findings: No rash.  Neurological:     Mental Status: She is alert. Mental status is at baseline.     GCS: GCS eye subscore is 4. GCS verbal subscore is 5. GCS motor subscore is 6.     Sensory: Sensation is intact.  Psychiatric:        Mood and Affect: Mood normal.     ED Results / Procedures / Treatments   Labs (all labs ordered are listed, but only abnormal results are displayed) Labs Reviewed  COMPREHENSIVE METABOLIC PANEL - Abnormal; Notable for the following components:      Result Value   Potassium 3.4 (*)    Glucose, Bld 104 (*)    Calcium 8.8 (*)    Total Protein 8.4 (*)    Albumin 3.4 (*)    All other components within normal limits  CBC WITH DIFFERENTIAL/PLATELET  LIPASE, BLOOD  VALPROIC ACID LEVEL  URINALYSIS, ROUTINE W REFLEX MICROSCOPIC    EKG EKG Interpretation  Date/Time:  Thursday March 02 2022 21:10:19 EDT Ventricular Rate:  83 PR Interval:  161 QRS Duration: 81 QT Interval:  401 QTC Calculation: 472 R Axis:   59 Text Interpretation: Sinus rhythm Confirmed by Kristine Royal [phone]) on 03/02/2022 10:46:36 PM  Radiology CT ABDOMEN PELVIS W CONTRAST  Result Date: 03/02/2022 CLINICAL DATA:  Abdominal pain with nausea and vomiting since last night. EXAM: CT ABDOMEN AND PELVIS WITH CONTRAST TECHNIQUE: Multidetector CT imaging of the abdomen and pelvis was performed using the standard protocol following bolus administration of intravenous contrast. RADIATION DOSE REDUCTION: This exam was performed according to the departmental dose-optimization program which includes automated exposure control, adjustment of the mA and/or kV according to patient  size and/or use of iterative reconstruction technique. CONTRAST:  38mL OMNIPAQUE IOHEXOL 350 MG/ML SOLN COMPARISON:  None Available. FINDINGS: Lower chest: Hypoventilatory change in the dependent lungs. Hepatobiliary: No suspicious hepatic lesion. Gallbladder is unremarkable. No biliary ductal dilation. Pancreas: No pancreatic ductal dilation or evidence of acute inflammation. Spleen: No splenomegaly or focal splenic lesion. Adrenals/Urinary Tract: Bilateral adrenal glands appear normal. No hydronephrosis. Kidneys demonstrate symmetric enhancement. Urinary bladder is unremarkable for degree of distension. Stomach/Bowel: No radiopaque enteric contrast material was administered. Stomach is unremarkable for degree of distension. No pathologic dilation of small or large bowel. No evidence of acute bowel inflammation. Vascular/Lymphatic: Normal caliber abdominal aorta. No pathologically enlarged abdominal or pelvic lymph nodes. Reproductive: Lobular uterine contour may reflect uterine leiomyomas. Possible endometrial thickening poorly evaluated on this examination. No suspicious adnexal mass. Other: No significant abdominopelvic free fluid. Musculoskeletal: No acute osseous abnormality. IMPRESSION: 1. No acute abnormality identified in the abdomen or pelvis. 2. Lobular uterine contour likely reflects uterine leiomyomas. There is possible endometrial thickening however this is poorly evaluated on CT, consider further evaluation by nonemergent dedicated pelvic ultrasound if clinically indicated. Electronically Signed   By: Maudry Mayhew M.D.   On: 03/02/2022 16:25    Procedures Procedures    Medications Ordered in ED Medications  LORazepam (ATIVAN) tablet 1 mg (has no administration in time range)  iohexol (OMNIPAQUE) 350 MG/ML injection 90 mL (90 mLs Intravenous Contrast Given 03/02/22 1616)  sodium chloride 0.9 % bolus 1,000 mL (0 mLs Intravenous Stopped 03/02/22 2240)    ED Course/ Medical Decision Making/  A&P                           Medical Decision Making Amount and/or Complexity of Data Reviewed Labs: ordered. Radiology: ordered. ECG/medicine tests: ordered.  Risk Prescription drug management.   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  History mostly obtained through mother who is at bedside.  Grandma reports for the past 2 weeks patient appears to be weaker than usual requiring help with ambulation.  Seems like she is leaning more towards her right side when walking which is new.  She also had a bouts of nausea and vomiting after dinner last night vomiting of food.  Grandmother states that everything else has been the same.  No recent medication changes.  Patient has been eating and drinking fine.  No complaint of urinary symptoms.  Endorse occasional cough but this is not new.  No complaint of headache chest pain or trouble breathing.  History is limited.  Since family report the patient was able to ambulate now without difficulty previously however for the past 2 weeks she is needing help with ambulation and appears to be leaning towards her right side more, therefore stroke is a consideration.  She is outside the window for aggressive treatment.  Will obtain head CT scan, check Depakote level, give IV fluid, give Zofran, and continue to monitor.  Care discussed with Dr. Rodena Medin.  Since patient endorsed some abdominal discomfort, abdominal pelvis CT scan was obtained show no acute abnormalities.  Although there is a lobular uterine contour likely reflect uterine leiomyoma at this time upper abdominal pain is not indicative of this finding.  Patient will likely benefit from nonemergent dedicated pelvic  ultrasound at another time but not according to this visit.  If Depakote level and head CT scan show no abnormalities, will consider brain MRI for further assessment.  We will need to consult neurology to be involved.  10:28 PM Appreciate consultation from neurologist Dr. Lesia Hausen who agrees with obtaining brain MRI for further evaluation is indicated.  Brain MRI ordered.    11:23 PM Normal valproic acid level, doubt valproic toxicity.  Patient signed out to oncoming team who will follow-up on head CT scan and brain MRI results and will reassess.  If brain MRI negative, may need PT/OT assessment due to gait instability and risk of falling.   This patient presents to the ED for concern of weakness, this involves an extensive number of treatment options, and is a complaint that carries with it a high risk of complications and morbidity.  The differential diagnosis includes acute stroke, drug toxicity, UTI, electrolytes imbalance, dehydration  Co morbidities that complicate the patient evaluation cerebral palsy  seizure Additional history obtained:  Additional history obtained from grandmother External records from outside source obtained and reviewed including EMR along with labs and imaging  Lab Tests:  I Ordered, and personally interpreted labs.  The pertinent results include:  as above  Imaging Studies ordered:  I ordered imaging studies including brain MRI I independently visualized and interpreted imaging which showed currently pending I agree with the radiologist interpretation  Cardiac Monitoring:  The patient was maintained on a cardiac monitor.  I personally viewed and interpreted the cardiac monitored which showed an underlying rhythm of: NSR  Medicines ordered and prescription drug management:  I ordered medication including IVF  for nausea/vomit Reevaluation of the patient after these medicines showed that the patient improved I have reviewed the patients home  medicines and have made adjustments as needed  Test Considered: as above  Critical Interventions: as above  Consultations Obtained:  I requested consultation with the neurologist,  and discussed lab and imaging findings as well as pertinent plan - they recommend: brain MRI  Problem List / ED Course: gait instability  Reevaluation:  After the interventions noted above, I reevaluated the patient and found that they have :stayed the same  Social Determinants of Health: none  Dispostion:  After consideration of the diagnostic results and the patients response to treatment, I feel that the patent would benefit from further evaluation and recommendation from oncoming provider.         Final Clinical Impression(s) / ED Diagnoses Final diagnoses:  None    Rx / DC Orders ED Discharge Orders     None         Fayrene Helper, PA-C 03/02/22 2329  Wynetta Fines, MD 03/03/22 [phone]

## 2022-03-03 DIAGNOSIS — R112 Nausea with vomiting, unspecified: Secondary | ICD-10-CM | POA: Diagnosis not present

## 2022-03-03 DIAGNOSIS — R569 Unspecified convulsions: Secondary | ICD-10-CM | POA: Diagnosis not present

## 2022-03-03 LAB — URINALYSIS, ROUTINE W REFLEX MICROSCOPIC
Bilirubin Urine: NEGATIVE
Glucose, UA: NEGATIVE mg/dL
Hgb urine dipstick: NEGATIVE
Ketones, ur: NEGATIVE mg/dL
Nitrite: NEGATIVE
Protein, ur: NEGATIVE mg/dL
Specific Gravity, Urine: 1.011 (ref 1.005–1.030)
pH: 7 (ref 5.0–8.0)

## 2022-03-03 MED ORDER — LACTATED RINGERS IV BOLUS
1000.0000 mL | Freq: Once | INTRAVENOUS | Status: AC
  Administered 2022-03-03: 1000 mL via INTRAVENOUS

## 2022-03-03 MED ORDER — ONDANSETRON HCL 4 MG/2ML IJ SOLN
4.0000 mg | Freq: Once | INTRAMUSCULAR | Status: AC
  Administered 2022-03-03: 4 mg via INTRAVENOUS
  Filled 2022-03-03: qty 2

## 2022-03-03 MED ORDER — ONDANSETRON 4 MG PO TBDP: 4.0000 mg | ORAL_TABLET | Freq: Three times a day (TID) | ORAL | 0 refills | Status: DC | PRN

## 2022-03-03 NOTE — ED Notes (Signed)
ED Provider at bedside. 

## 2022-03-03 NOTE — ED Notes (Signed)
Patient assisted to restroom. Urine collected and sample sent.

## 2022-03-03 NOTE — ED Notes (Signed)
Returned from MRI 

## 2022-03-04 LAB — URINE CULTURE: Culture: <10000 — AB

## 2022-03-20 ENCOUNTER — Other Ambulatory Visit: Payer: Self-pay

## 2022-03-20 ENCOUNTER — Other Ambulatory Visit: Payer: Self-pay | Admitting: Family Medicine

## 2022-03-20 DIAGNOSIS — I10 Essential (primary) hypertension: Secondary | ICD-10-CM

## 2022-03-20 MED ORDER — LOSARTAN POTASSIUM 50 MG PO TABS: 50.0000 mg | ORAL_TABLET | Freq: Every day | ORAL | 1 refills | Status: DC

## 2022-03-21 ENCOUNTER — Other Ambulatory Visit: Payer: Self-pay

## 2022-03-21 ENCOUNTER — Other Ambulatory Visit: Payer: Self-pay | Admitting: Adult Health

## 2022-03-21 DIAGNOSIS — E876 Hypokalemia: Secondary | ICD-10-CM

## 2022-03-21 DIAGNOSIS — G40909 Epilepsy, unspecified, not intractable, without status epilepticus: Secondary | ICD-10-CM

## 2022-03-22 ENCOUNTER — Other Ambulatory Visit: Payer: Self-pay | Admitting: Adult Health

## 2022-03-22 DIAGNOSIS — G40909 Epilepsy, unspecified, not intractable, without status epilepticus: Secondary | ICD-10-CM

## 2022-03-23 MED ORDER — POTASSIUM CHLORIDE ER 10 MEQ PO TBCR: 10.0000 meq | EXTENDED_RELEASE_TABLET | Freq: Every day | ORAL | 0 refills | Status: DC

## 2022-03-29 NOTE — Progress Notes (Deleted)
    SUBJECTIVE:   CHIEF COMPLAINT / HPI:   Deconditioning/Cerebral palsy Seizure and weakness leading to fall  HTN Blood pressure on increased Losartan?  Thyroid  PERTINENT  PMH / PSH: ***  OBJECTIVE:   There were no vitals taken for this visit.  ***  ASSESSMENT/PLAN:   No problem-specific Assessment & Plan notes found for this encounter.     Celine Mans, MD [facility]

## 2022-03-30 ENCOUNTER — Ambulatory Visit: Payer: Medicare Other | Admitting: Family Medicine

## 2022-05-15 ENCOUNTER — Encounter: Payer: Self-pay | Admitting: Diagnostic Neuroimaging

## 2022-05-15 ENCOUNTER — Institutional Professional Consult (permissible substitution): Payer: Medicare Other | Admitting: Diagnostic Neuroimaging

## 2022-06-27 ENCOUNTER — Encounter: Payer: Self-pay | Admitting: Diagnostic Neuroimaging

## 2022-06-27 ENCOUNTER — Ambulatory Visit (INDEPENDENT_AMBULATORY_CARE_PROVIDER_SITE_OTHER): Payer: Medicare Other | Admitting: Diagnostic Neuroimaging

## 2022-06-27 VITALS — BP 165/105 | HR 82 | Ht 63.0 in | Wt <= 1120 oz

## 2022-06-27 DIAGNOSIS — G40909 Epilepsy, unspecified, not intractable, without status epilepticus: Secondary | ICD-10-CM

## 2022-06-27 NOTE — Progress Notes (Signed)
[facility]  PATIENT: Patricia White DOB: 1971-03-24  REFERRING CLINICIAN: Celine Mans, MD  HISTORY FROM: patient and aunt REASON FOR VISIT: follow up   HISTORICAL  CHIEF COMPLAINT:  Chief Complaint  Patient presents with   Establish Care    Pt aunt has concerns about her jerking. Room 7 with aunt.    HISTORY OF PRESENT ILLNESS:   UPDATE (06/27/22, VRP): Since last visit, doing well, except had ER visit in July 2023; had vomiting and weakness, and some generalized jerking movements. Now back to baseline.   Update 09/29/2021 JM: Returns for 1 year seizure follow-up accompanied by her aunt.  Overall stable without any seizure activity.  Compliant on Depakote, Tegretol and Topamax without side effects.  Requires brand-name as she has failed generics. Does report some increased extremity jerking in the morning which can be typical with colder weather.  No new concerns at this time.  UPDATE 12/12/13: 51 year old female with moderate MR, congenital left hemiparesis, here for follow up of seizure d/o. Last seizure in 2009. Still with intermittent myoclonus in the AM, 3 times per week. Tolerating meds.  PRIOR HPI (07/23/12, CM):  51 year old black female returns today for followup. She was last seen in our office 07/17/11. She is a previous pt of Dr. Sharene Skeans. 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 She returns today with her grandmother. She was going  to [facility] but the funding was cut.  She has had no seizures  in several years.  She is currently on 3 different medications.She has had no falls. Continues to have left lower extremity weakness with AFO in place. She intermittently wears a helmet but she does not have it on today. No hospitalizations or acute infections since last seen.    REVIEW OF SYSTEMS: Full 14 system review of systems performed and notable only for nothing.  ALLERGIES: No Known Allergies   HOME  MEDICATIONS: Outpatient Medications Prior to Visit  Medication Sig Dispense Refill   atorvastatin (LIPITOR) 40 MG tablet Take 1 tablet (40 mg total) by mouth daily. 90 tablet 3   carbamazepine (TEGRETOL) 200 MG tablet TAKE 1&1/2 TABLETS BY MOUTH 2 TIMES DAILY EVERY MORNING AND AT BEDTIME 90 tablet 5   DEPAKOTE ER 250 MG 24 hr tablet Take 2 tablets (500 mg total) by mouth 2 (two) times daily. 360 tablet 3   levocetirizine (XYZAL) 5 MG tablet TAKE 1 TABLET BY MOUTH 2 TIMES DAILY 60 tablet 5   losartan (COZAAR) 50 MG tablet Take 1 tablet (50 mg total) by mouth at bedtime. 90 tablet 1   potassium chloride (KLOR-CON) 10 MEQ tablet Take 1 tablet (10 mEq total) by mouth daily. 30 tablet 0   TOPAMAX 25 MG tablet Take 3 tablets (75 mg total) by mouth 2 (two) times daily. 540 tablet 3   Accu-Chek FastClix Lancets MISC 1 Device by Does not apply route as directed. (Patient not taking: Reported on 06/27/2022) 102 each 1   ondansetron (ZOFRAN-ODT) 4 MG disintegrating tablet Take 1 tablet (4 mg total) by mouth every 8 (eight) hours as needed. 4mg  ODT q4 hours prn nausea/vomit (Patient not taking: Reported on 06/27/2022) 30 tablet 0   Facility-Administered Medications Prior to Visit  Medication Dose Route Frequency Provider Last Rate Last Admin   omalizumab 06/29/2022) prefilled syringe 300 mg  300 mg Subcutaneous Q28 days Geoffry Paradise, MD   300 mg at 10/21/20 1331   omalizumab 12/21/20) prefilled  syringe 300 mg  300 mg Subcutaneous Q28 days Marcelyn Bruins, MD   300 mg at 11/19/20 1559    PAST MEDICAL HISTORY: Past Medical History:  Diagnosis Date   Asthma    Cerebral palsy (HCC) Birth   Mental retardation Birth   Seizures (HCC)     PAST SURGICAL HISTORY: No past surgical history on file.   FAMILY HISTORY: Family History  Problem Relation Age of Onset   Migraines Mother    Cancer Mother        liver cancer   Diabetes Maternal Aunt    Hypertension Maternal Grandmother     Colon cancer Maternal Grandfather 60   Prostate cancer Maternal Grandfather    Throat cancer Maternal Grandfather     SOCIAL HISTORY:  Social History   Socioeconomic History   Marital status: Single    Spouse name: Not on file   Number of children: 0   Years of education: dis.school   Highest education level: Not on file  Occupational History    Comment: disabled0  Tobacco Use   Smoking status: Never   Smokeless tobacco: Never  Vaping Use   Vaping Use: Never used  Substance and Sexual Activity   Alcohol use: No   Drug use: No   Sexual activity: Never  Other Topics Concern   Not on file  Social History Narrative   Patient is right handed      Current Social History 04/13/2017           Patient lives with grandmother([name] Mila Palmer), maternal aunt Yezenia Fredrick), female cousin and maternal uncle in one level home 04/13/2017   Transportation: Patient relies on aunt for transportation 04/13/2017   Important Relationships Family 04/13/2017    Pets: [place], "Harmony" 04/13/2017   Education / Work:  12 th grade/ Disabled 04/13/2017   Interests / Fun: Going to [employer], out for pizza, scary movies 04/13/2017   Religious / Personal Beliefs: Attends Non-Denominational Church 04/13/2017   L. Ducatte, RN, BSN                                                                                                 Social Determinants of Health   Financial Resource Strain: Not on file  Food Insecurity: Not on file  Transportation Needs: Not on file  Physical Activity: Not on file  Stress: Not on file  Social Connections: Not on file  Intimate Partner Violence: Not on file     PHYSICAL EXAM  Vitals:   06/27/22 1125  BP: (!) 165/105  Pulse: 82  Weight: 10 lb 6 oz (4.706 kg)  Height: 5\' 3"  (1.6 m)   Not recorded     Body mass index is 1.84 kg/m.  GENERAL EXAM: Patient is in no distress; well developed, nourished and groomed; neck is supple; POOR  DENTITION.  CARDIOVASCULAR: Regular rate and rhythm, no murmurs, no carotid bruits  NEUROLOGIC: MENTAL STATUS: awake, alert, DECR FLUENCY, COMP INTACT. PLEASANT.  CRANIAL NERVE: no papilledema on fundoscopic exam, pupils equal and reactive to light, visual fields full to confrontation, extraocular muscles intact, no nystagmus,  facial sensation and strength symmetric, hearing intact, palate elevates symmetrically, uvula midline, shoulder shrug symmetric, tongue midline. MOTOR: LEFT ARM AND LEG ATROPHY, CONGENTIAL SPASTIC LEFT HEMIPARESIS; RIGHT SIDE 5/5. LUE (4), LLE (3-4).  SENSORY: normal and symmetric to light touch  COORDINATION: finger-nose-finger, fine finger movements normal REFLEXES: deep tendon reflexes present and symmetric GAIT/STATION: SPASTIC, LEFT HEMIPARETIC GAIT    DIAGNOSTIC DATA (LABS, IMAGING, TESTING) - I reviewed patient records, labs, notes, testing and imaging myself where available.  Lab Results  Component Value Date   WBC 4.9 03/02/2022   HGB 12.0 03/02/2022   HCT 36.7 03/02/2022   MCV 92.9 03/02/2022   PLT 216 03/02/2022      Component Value Date/Time   NA 137 03/02/2022 1440   NA 138 02/17/2022 1441   K 3.4 (L) 03/02/2022 1440   CL 106 03/02/2022 1440   CO2 24 03/02/2022 1440   GLUCOSE 104 (H) 03/02/2022 1440   BUN 11 03/02/2022 1440   BUN 13 02/17/2022 1441   CREATININE 0.89 03/02/2022 1440   CALCIUM 8.8 (L) 03/02/2022 1440   PROT 8.4 (H) 03/02/2022 1440   PROT 8.2 02/17/2022 1441   ALBUMIN 3.4 (L) 03/02/2022 1440   ALBUMIN 4.0 02/17/2022 1441   AST 15 03/02/2022 1440   ALT 9 03/02/2022 1440   ALKPHOS 81 03/02/2022 1440   BILITOT 0.5 03/02/2022 1440   BILITOT <0.2 02/17/2022 1441   GFRNONAA >60 03/02/2022 1440   GFRAA >60 01/19/2020 2148   Lab Results  Component Value Date   CHOL 220 (H) 11/23/2020   HDL 52 11/23/2020   LDLCALC 121 (H) 11/23/2020   TRIG 272 (H) 11/23/2020   CHOLHDL 4.2 11/23/2020   Lab Results  Component Value  Date   HGBA1C 5.0 09/22/2021   No results found for: "VITAMINB12"  Lab Results  Component Value Date   TSH 10.300 (H) 02/17/2022      ASSESSMENT AND PLAN  51 y.o. year old female here with:   PLAN:  INTELLECTUAL DISABILITY / CEREBRAL PALSY / LEFT HEMIPARESIS / SEIZURE DISORDER  - continue carbamazepine 300mg  twice a day  - continue divalproex ER 500mg  twice a day  - continue topiramate 75mg  twice a day   Return for with NP McCue) --> change Feb 2024 appt  to Nov 2024.    Shanda Bumps, MD 06/27/2022, 11:52 AM Certified in Neurology, Neurophysiology and Neuroimaging  [facility] [address] [place], [facility] [address] [phone]

## 2022-07-16 NOTE — Congregational Nurse Program (Signed)
Member seen at [facility], helped to bathroom and she was able to go to bathroom with minimal help , reminded to wash hands.  Head shaved.  Accompanied grandmother to church.  Velcro on foot brace worn out.  Will call grandmother to see if can get another.  Grandmother asked about flu vaccine.  Patricia White is to go to dr. next week.  Grandmother to ask for flu vaccine then.  Juliann Pulse, RN, Congregational Nurse, [phone].

## 2022-07-31 ENCOUNTER — Other Ambulatory Visit: Payer: Self-pay | Admitting: Adult Health

## 2022-07-31 DIAGNOSIS — G40909 Epilepsy, unspecified, not intractable, without status epilepticus: Secondary | ICD-10-CM

## 2022-08-08 ENCOUNTER — Other Ambulatory Visit: Payer: Self-pay | Admitting: Family Medicine

## 2022-08-08 DIAGNOSIS — I10 Essential (primary) hypertension: Secondary | ICD-10-CM

## 2022-08-09 ENCOUNTER — Other Ambulatory Visit: Payer: Self-pay

## 2022-08-09 DIAGNOSIS — E1169 Type 2 diabetes mellitus with other specified complication: Secondary | ICD-10-CM

## 2022-08-09 MED ORDER — ATORVASTATIN CALCIUM 40 MG PO TABS: 40.0000 mg | ORAL_TABLET | Freq: Every day | ORAL | 3 refills | Status: DC

## 2022-09-07 ENCOUNTER — Ambulatory Visit (INDEPENDENT_AMBULATORY_CARE_PROVIDER_SITE_OTHER): Payer: 59

## 2022-09-07 ENCOUNTER — Ambulatory Visit (HOSPITAL_COMMUNITY)
Admission: EM | Admit: 2022-09-07 | Discharge: 2022-09-07 | Disposition: A | Discharge status: Home / Self Care | Payer: 59 | Attending: Family Medicine | Admitting: Family Medicine

## 2022-09-07 ENCOUNTER — Encounter (HOSPITAL_COMMUNITY): Payer: Self-pay

## 2022-09-07 ENCOUNTER — Other Ambulatory Visit: Payer: Self-pay | Admitting: Adult Health

## 2022-09-07 DIAGNOSIS — R059 Cough, unspecified: Secondary | ICD-10-CM

## 2022-09-07 DIAGNOSIS — R6883 Chills (without fever): Secondary | ICD-10-CM | POA: Diagnosis not present

## 2022-09-07 DIAGNOSIS — R918 Other nonspecific abnormal finding of lung field: Secondary | ICD-10-CM | POA: Diagnosis not present

## 2022-09-07 DIAGNOSIS — G40909 Epilepsy, unspecified, not intractable, without status epilepticus: Secondary | ICD-10-CM

## 2022-09-07 DIAGNOSIS — J189 Pneumonia, unspecified organism: Secondary | ICD-10-CM | POA: Diagnosis not present

## 2022-09-07 MED ORDER — BENZONATATE 100 MG PO CAPS: 100.0000 mg | ORAL_CAPSULE | Freq: Three times a day (TID) | ORAL | 0 refills | Status: DC | PRN

## 2022-09-07 MED ORDER — LEVOFLOXACIN 500 MG PO TABS: 500.0000 mg | ORAL_TABLET | Freq: Every day | ORAL | 0 refills | Status: AC

## 2022-09-07 NOTE — ED Triage Notes (Signed)
Chief Complaint: Cough, Loss of Appetite, Chills. No fever. Patient having productive cough and runny nose.   Onset: 5 days   Prescriptions or OTC medications tried: Yes- alka-seltzer cold plus, Nyquil     with no relief  Sick exposure: No

## 2022-09-07 NOTE — ED Provider Notes (Addendum)
[facility]    CSN: [id number] Arrival date & time: 09/07/22  0941      History   Chief Complaint Chief Complaint  Patient presents with   Cough   Chills       HPI Patricia White is a 52 y.o. female.    Cough  Here for congestion and rhinorrhea and cough.  Symptoms began 8 days ago.  No fever or chills.  No nausea or vomiting or diarrhea.  She continues to eat normally.  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  She also has a history of diabetes.  She has brought in today by caregiver and her mother, whom contribute to the history Past Medical History:  Diagnosis Date   Asthma    Cerebral palsy (HCC) Birth   Mental retardation Birth   Seizures [facility])     Patient Active Problem List   Diagnosis Date Noted   Hot flashes due to menopause 06/30/2021   Hyperlipidemia associated with type 2 diabetes mellitus (HCC) 11/24/2020   Diabetes mellitus without complication (HCC) 11/23/2020   Hypokalemia 11/23/2020   Angioedema 12/10/2019   Lesion of mandible 12/10/2019   Seasonal allergies 11/20/2019   Essential hypertension 10/23/2019   Seizure disorder (HCC) 02/23/2016   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 No pertinent surgical history.  OB History   No obstetric history on file.      Home Medications    Prior to Admission medications   Medication Sig Start Date End Date Taking? Authorizing Provider  atorvastatin (LIPITOR) 40 MG tablet Take 1 tablet (40 mg total) by mouth daily. 08/09/22  Yes Elberta Fortis, MD  benzonatate (TESSALON) 100 MG capsule Take 1 capsule (100 mg total) by mouth 3 (three) times daily as needed for cough. 09/07/22  Yes Zenia Resides, MD  levocetirizine (XYZAL) 5 MG tablet TAKE 1 TABLET BY MOUTH 2 TIMES DAILY 03/17/21  Yes Padgett,  Pilar Grammes, MD  levofloxacin (LEVAQUIN) 500 MG tablet Take 1 tablet (500 mg total) by mouth daily for 7 days. 09/07/22 09/14/22 Yes Zenia Resides, MD  losartan (COZAAR) 50 MG tablet TAKE 1 TABLET BY MOUTH AT BEDTIME 08/09/22  Yes Elberta Fortis, MD  potassium chloride (KLOR-CON) 10 MEQ tablet Take 1 tablet (10 mEq total) by mouth daily. 03/23/22  Yes Celine Mans, MD  TEGRETOL 200 MG tablet TAKE 1 AND 1/2 TABLETS BY MOUTH 2 TIMES DAILY in the morning and at bedtime 07/31/22  Yes Penumalli, Glenford Bayley, MD  Accu-Chek FastClix Lancets MISC 1 Device by Does not apply route as directed. 11/24/20   Mullis, Kiersten P, DO  DEPAKOTE ER 250 MG 24 hr tablet TAKE 2 TABLETS BY MOUTH 2 TIMES DAILY 09/07/22   Ihor Austin, NP  TOPAMAX 25 MG tablet TAKE 3 TABLETS BY MOUTH 2 TIMES DAILY 09/07/22   Ihor Austin, NP    Family History Family History  Problem Relation Age of Onset   Migraines Mother    Cancer Mother        liver cancer   Diabetes Maternal Aunt    Hypertension Maternal Grandmother    Colon cancer Maternal Grandfather 60   Prostate cancer Maternal Grandfather    Throat cancer Maternal Grandfather     Social History Social History   Tobacco Use   Smoking status: Never   Smokeless  tobacco: Never  Vaping Use   Vaping Use: Never used  Substance Use Topics   Alcohol use: No   Drug use: No     Allergies   Patient has no known allergies.   Review of Systems Review of Systems  Respiratory:  Positive for cough.      Physical Exam Triage Vital Signs ED Triage Vitals  Enc Vitals Group     BP 09/07/22 1117 131/73     Pulse Rate 09/07/22 1117 93     Resp 09/07/22 1117 16     Temp 09/07/22 1117 (!) 97.3 F (36.3 C)     Temp Source 09/07/22 1117 Oral     SpO2 09/07/22 1117 94 %     Weight 09/07/22 1116 170 lb (77.1 kg)     Height 09/07/22 1116 5\' 3"  (1.6 m)     Head Circumference --      Peak Flow --      Pain Score --      Pain Loc --      Pain Edu? --       Excl. in [place]? --    No data found.  Updated Vital Signs BP 131/73 (BP Location: Left Arm)   Pulse 93   Temp (!) 97.3 F (36.3 C) (Oral)   Resp 16   Ht 5\' 3"  (1.6 m)   Wt 77.1 kg   SpO2 94%   BMI 30.11 kg/m   Visual Acuity Right Eye Distance:   Left Eye Distance:   Bilateral Distance:    Right Eye Near:   Left Eye Near:    Bilateral Near:     Physical Exam Vitals reviewed.  Constitutional:      General: She is not in acute distress.    Appearance: She is not ill-appearing, toxic-appearing or diaphoretic.  HENT:     Nose: Congestion present.     Mouth/Throat:     Mouth: Mucous membranes are moist.     Pharynx: No oropharyngeal exudate or posterior oropharyngeal erythema.  Eyes:     Extraocular Movements: Extraocular movements intact.     Conjunctiva/sclera: Conjunctivae normal.     Pupils: Pupils are equal, round, and reactive to light.  Cardiovascular:     Rate and Rhythm: Normal rate and regular rhythm.     Heart sounds: No murmur heard. Pulmonary:     Effort: No respiratory distress.     Breath sounds: No stridor. No rhonchi or rales.     Comments: Breath sounds are coarse.  When she, she wheezes Musculoskeletal:     Cervical back: Neck supple.  Lymphadenopathy:     Cervical: No cervical adenopathy.  Skin:    Coloration: Skin is not jaundiced or pale.  Neurological:     General: No focal deficit present.     Mental Status: She is alert.      UC Treatments / Results  Labs (all labs ordered are listed, but only abnormal results are displayed) Labs Reviewed - No data to display  EKG   Radiology DG Chest 2 View  Result Date: 09/07/2022 CLINICAL DATA:  Productive cough and chills for the past 8 days. EXAM: CHEST - 2 VIEW COMPARISON:  None Available. FINDINGS: The heart size and mediastinal contours are within normal limits. Normal pulmonary vascularity. Patchy opacity at the left lung base. The right lung is clear. No pleural effusion or pneumothorax.  Slight elevation of the left hemidiaphragm. No acute osseous abnormality. IMPRESSION: 1. Left basilar atelectasis versus pneumonia.  Electronically Signed   By: Titus Dubin M.D.   On: 09/07/2022 12:28    Procedures Procedures (including critical care time)  Medications Ordered in UC Medications - No data to display  Initial Impression / Assessment and Plan / UC Course  I have reviewed the triage vital signs and the nursing notes.  Pertinent labs & imaging results that were available during my care of the patient were reviewed by me and considered in my medical decision making (see chart for details).        X-ray shows some left lower opacity that could be atelectasis versus pneumonia. Final Clinical Impressions(s) / UC Diagnoses   Final diagnoses:  Community acquired pneumonia of left lower lobe of lung     Discharge Instructions      The chest x-ray showed a possible pneumonia in the left lower lobe  Levaquin 500 mg--take 1 tablet daily for 5 days  Take benzonatate 100 mg, 1 tab every 8 hours as needed for cough.  If she becomes weaker or is not taking in fluids, please have her evaluated in the emergency room     ED Prescriptions     Medication Sig Dispense Auth. Provider   levofloxacin (LEVAQUIN) 500 MG tablet Take 1 tablet (500 mg total) by mouth daily for 7 days. 7 tablet Rebacca Votaw, Gwenlyn Perking, MD   benzonatate (TESSALON) 100 MG capsule Take 1 capsule (100 mg total) by mouth 3 (three) times daily as needed for cough. 21 capsule Barrett Henle, MD      PDMP not reviewed this encounter.   Barrett Henle, MD 09/07/22 1239    Barrett Henle, MD 09/07/22 1240

## 2022-09-07 NOTE — Discharge Instructions (Signed)
The chest x-ray showed a possible pneumonia in the left lower lobe  Levaquin 500 mg--take 1 tablet daily for 5 days  Take benzonatate 100 mg, 1 tab every 8 hours as needed for cough.  If she becomes weaker or is not taking in fluids, please have her evaluated in the emergency room

## 2022-09-15 ENCOUNTER — Encounter (HOSPITAL_COMMUNITY): Payer: Self-pay

## 2022-09-25 ENCOUNTER — Ambulatory Visit (INDEPENDENT_AMBULATORY_CARE_PROVIDER_SITE_OTHER): Payer: 59

## 2022-09-25 DIAGNOSIS — Z23 Encounter for immunization: Secondary | ICD-10-CM

## 2022-10-02 ENCOUNTER — Ambulatory Visit: Payer: Medicare Other | Admitting: Adult Health

## 2022-12-05 ENCOUNTER — Ambulatory Visit: Payer: 59 | Admitting: Family Medicine

## 2022-12-05 NOTE — Progress Notes (Deleted)
    SUBJECTIVE:   CHIEF COMPLAINT / HPI:   BP -   Deconditioning -   Seizures -   PERTINENT  PMH / PSH: HTN, Seizure disorder, T2DM  OBJECTIVE:   There were no vitals taken for this visit.  ***  ASSESSMENT/PLAN:   There are no diagnoses linked to this encounter. No follow-ups on file.  Celine Mans, MD [facility]

## 2022-12-12 ENCOUNTER — Other Ambulatory Visit: Payer: Self-pay

## 2022-12-12 DIAGNOSIS — G40909 Epilepsy, unspecified, not intractable, without status epilepticus: Secondary | ICD-10-CM

## 2022-12-12 DIAGNOSIS — E1169 Type 2 diabetes mellitus with other specified complication: Secondary | ICD-10-CM

## 2022-12-12 MED ORDER — ATORVASTATIN CALCIUM 40 MG PO TABS: 40.0000 mg | ORAL_TABLET | Freq: Every day | ORAL | 3 refills | Status: DC

## 2022-12-13 ENCOUNTER — Other Ambulatory Visit: Payer: Self-pay

## 2022-12-13 DIAGNOSIS — G40909 Epilepsy, unspecified, not intractable, without status epilepticus: Secondary | ICD-10-CM

## 2022-12-13 MED ORDER — DEPAKOTE ER 250 MG PO TB24
500.0000 mg | ORAL_TABLET | Freq: Two times a day (BID) | ORAL | 2 refills | Status: DC
Start: 2022-12-13 — End: 2023-08-01

## 2022-12-13 MED ORDER — TEGRETOL 200 MG PO TABS
ORAL_TABLET | ORAL | 6 refills | Status: DC
Start: 2022-12-13 — End: 2023-08-01

## 2022-12-15 ENCOUNTER — Other Ambulatory Visit: Payer: Self-pay

## 2022-12-15 DIAGNOSIS — I10 Essential (primary) hypertension: Secondary | ICD-10-CM

## 2022-12-15 DIAGNOSIS — G40909 Epilepsy, unspecified, not intractable, without status epilepticus: Secondary | ICD-10-CM

## 2022-12-15 MED ORDER — LOSARTAN POTASSIUM 50 MG PO TABS: 50.0000 mg | ORAL_TABLET | Freq: Every day | ORAL | 0 refills | Status: DC

## 2023-02-12 ENCOUNTER — Encounter: Payer: Self-pay | Admitting: Student

## 2023-02-12 ENCOUNTER — Ambulatory Visit (INDEPENDENT_AMBULATORY_CARE_PROVIDER_SITE_OTHER): Payer: 59 | Admitting: Student

## 2023-02-12 VITALS — BP 150/100 | HR 109 | Wt 173.0 lb

## 2023-02-12 DIAGNOSIS — I10 Essential (primary) hypertension: Secondary | ICD-10-CM

## 2023-02-12 DIAGNOSIS — E119 Type 2 diabetes mellitus without complications: Secondary | ICD-10-CM | POA: Diagnosis not present

## 2023-02-12 LAB — POCT GLYCOSYLATED HEMOGLOBIN (HGB A1C): HbA1c, POC (controlled diabetic range): 5.7 % (ref 0.0–7.0)

## 2023-02-12 MED ORDER — LOSARTAN POTASSIUM 100 MG PO TABS: 50.0000 mg | ORAL_TABLET | Freq: Every day | ORAL | 0 refills | Status: DC

## 2023-02-12 NOTE — Patient Instructions (Addendum)
I recommend the shingles vaccine.   I am increasing her blood pressure medication LOSARTAN to 100 mg. If you have 50 mg pills at home please start taking 2 pills. The new prescription I will send in will have the updated dosage.    Future Appointments  Date Time Provider Department Center  03/12/2023 10:10 AM Celine Mans, MD [facility]  06/28/2023 11:15 AM Ihor Austin, NP [facility] None    Please arrive 15 minutes before your appointment to ensure smooth check in process.    Please call the clinic at [phone] if your symptoms worsen or you have any concerns.  Thank you for allowing me to participate in your care, Dr. Glendale Chard [facility]

## 2023-02-12 NOTE — Assessment & Plan Note (Signed)
Increase losartan to 100 mg daily.  Schedule follow-up with PCP in 3 weeks and will need rechecked BMP to monitor electrolytes

## 2023-02-12 NOTE — Progress Notes (Signed)
    SUBJECTIVE:   CHIEF COMPLAINT / HPI:   Patricia White is a 52 y.o. female  presenting for wellness visit.   Sister Patricia White resident and helps provide history.  Reports no problems or complications today.  She would like to have her A1c checked for prior history of diabetes.Marland Kitchen  HTN: Patient is on losartan 50 mg daily and reports good compliance.  At prior office visits appears her blood pressure has been elevated.  PERTINENT  PMH / PSH: Reviewed and updated   OBJECTIVE:   BP (!) 150/100 (BP Location: Right Arm, Cuff Size: Normal)   Pulse (!) 109   Wt 173 lb (78.5 kg)   SpO2 98%   BMI 30.65 kg/m   Well-appearing, no acute distress Cardio: Regular rate, regular rhythm, no murmurs on exam. Pulm: Clear, no wheezing, no crackles. No increased work of breathing Abdominal: bowel sounds present, soft, non-tender, non-distended Extremities: no peripheral edema  Neuro: alert and oriented x3, speech normal in content, no facial asymmetry, strength intact and equal bilaterally in UE and LE, pupils equal and reactive to light.  Psych:  Cognition and judgment appear intact. Alert, communicative  and cooperative with normal attention span and concentration. No apparent delusions, illusions, hallucinations      02/12/2023    2:19 PM 02/17/2022    1:28 PM 09/22/2021    5:31 PM  PHQ9 SCORE ONLY  PHQ-9 Total Score 0 4 0      ASSESSMENT/PLAN:   Essential hypertension Increase losartan to 100 mg daily.  Schedule follow-up with PCP in 3 weeks and will need rechecked BMP to monitor electrolytes  Diabetes mellitus without complication (HCC) A1c 5.7.  Patient continues to be well-controlled no changes in medications     Glendale Chard, DO [facility]

## 2023-02-12 NOTE — Assessment & Plan Note (Signed)
A1c 5.7.  Patient continues to be well-controlled no changes in medications

## 2023-02-19 ENCOUNTER — Other Ambulatory Visit: Payer: Self-pay | Admitting: Student

## 2023-02-19 DIAGNOSIS — I10 Essential (primary) hypertension: Secondary | ICD-10-CM

## 2023-03-11 NOTE — Progress Notes (Deleted)
    SUBJECTIVE:   CHIEF COMPLAINT / HPI: HTN, f/u  ***  PERTINENT  PMH / PSH: T2DM, Cerebral palsy, Seizure disorder, Left hemiparesis  OBJECTIVE:   There were no vitals taken for this visit.  ***  ASSESSMENT/PLAN:   There are no diagnoses linked to this encounter. No follow-ups on file.  Celine Mans, MD [facility]

## 2023-03-12 ENCOUNTER — Ambulatory Visit: Payer: Self-pay | Admitting: Family Medicine

## 2023-03-12 DIAGNOSIS — I10 Essential (primary) hypertension: Secondary | ICD-10-CM

## 2023-03-23 ENCOUNTER — Ambulatory Visit: Payer: 59

## 2023-03-23 VITALS — Ht 63.0 in | Wt 173.0 lb

## 2023-03-23 DIAGNOSIS — Z0000 Encounter for general adult medical examination without abnormal findings: Secondary | ICD-10-CM | POA: Diagnosis not present

## 2023-03-24 NOTE — Patient Instructions (Signed)
Ms. Cly , Thank you for taking time to come for your Medicare Wellness Visit. I appreciate your ongoing commitment to your health goals. Please review the following plan we discussed and let me know if I can assist you in the future.   Referrals/Orders/Follow-Ups/Clinician Recommendations: Aim for 30 minutes of exercise or brisk walking, 6-8 glasses of water, and 5 servings of fruits and vegetables each day.  This is a list of the screening recommended for you and due dates:  Health Maintenance  Topic Date Due   Eye exam for diabetics  Never done   Yearly kidney health urinalysis for diabetes  Never done   Pap Smear  12/17/1993   Complete foot exam   03/03/2022   COVID-19 Vaccine (5 - 2023-24 season) 04/14/2022   Mammogram  Never done   Zoster (Shingles) Vaccine (1 of 2) Never done   Yearly kidney function blood test for diabetes  03/03/2023   Flu Shot  03/15/2023   Hemoglobin A1C  08/15/2023   Medicare Annual Wellness Visit  03/22/2024   DTaP/Tdap/Td vaccine (3 - Td or Tdap) 09/17/2028   Colon Cancer Screening  09/15/2029   Hepatitis C Screening  Completed   HIV Screening  Completed   HPV Vaccine  Aged Out    Advanced directives: (ACP Link)Information on Advanced Care Planning can be found at [facility] [place] Advance Health Care Directives Advance Health Care Directives ([email or web address]   Next Medicare Annual Wellness Visit scheduled for next year: Yes  Preventive Care 40-64 Years, Female Preventive care refers to lifestyle choices and visits with your health care provider that can promote health and wellness. What does preventive care include? A yearly physical exam. This is also called an annual well check. Dental exams once or twice a year. Routine eye exams. Ask your health care provider how often you should have your eyes checked. Personal lifestyle choices, including: Daily care of your teeth and gums. Regular physical activity. Eating a healthy  diet. Avoiding tobacco and drug use. Limiting alcohol use. Practicing safe sex. Taking low-dose aspirin daily starting at age 60. Taking vitamin and mineral supplements as recommended by your health care provider. What happens during an annual well check? The services and screenings done by your health care provider during your annual well check will depend on your age, overall health, lifestyle risk factors, and family history of disease. Counseling  Your health care provider may ask you questions about your: Alcohol use. Tobacco use. Drug use. Emotional well-being. Home and relationship well-being. Sexual activity. Eating habits. Work and work Astronomer. Method of birth control. Menstrual cycle. Pregnancy history. Screening  You may have the following tests or measurements: Height, weight, and BMI. Blood pressure. Lipid and cholesterol levels. These may be checked every 5 years, or more frequently if you are over 60 years old. Skin check. Lung cancer screening. You may have this screening every year starting at age 43 if you have a 30-pack-year history of smoking and currently smoke or have quit within the past 15 years. Fecal occult blood test (FOBT) of the stool. You may have this test every year starting at age 59. Flexible sigmoidoscopy or colonoscopy. You may have a sigmoidoscopy every 5 years or a colonoscopy every 10 years starting at age 75. Hepatitis C blood test. Hepatitis B blood test. Sexually transmitted disease (STD) testing. Diabetes screening. This is done by checking your blood sugar (glucose) after you have not eaten for a while (fasting). You may have this  done every 1-3 years. Mammogram. This may be done every 1-2 years. Talk to your health care provider about when you should start having regular mammograms. This may depend on whether you have a family history of breast cancer. BRCA-related cancer screening. This may be done if you have a family history of  breast, ovarian, tubal, or peritoneal cancers. Pelvic exam and Pap test. This may be done every 3 years starting at age 51. Starting at age 43, this may be done every 5 years if you have a Pap test in combination with an HPV test. Bone density scan. This is done to screen for osteoporosis. You may have this scan if you are at high risk for osteoporosis. Discuss your test results, treatment options, and if necessary, the need for more tests with your health care provider. Vaccines  Your health care provider may recommend certain vaccines, such as: Influenza vaccine. This is recommended every year. Tetanus, diphtheria, and acellular pertussis (Tdap, Td) vaccine. You may need a Td booster every 10 years. Zoster vaccine. You may need this after age 21. Pneumococcal 13-valent conjugate (PCV13) vaccine. You may need this if you have certain conditions and were not previously vaccinated. Pneumococcal polysaccharide (PPSV23) vaccine. You may need one or two doses if you smoke cigarettes or if you have certain conditions. Talk to your health care provider about which screenings and vaccines you need and how often you need them. This information is not intended to replace advice given to you by your health care provider. Make sure you discuss any questions you have with your health care provider. Document Released: 08/27/2015 Document Revised: 04/19/2016 Document Reviewed: 06/01/2015 Elsevier Interactive Patient Education  2017 [employer].    Fall Prevention in the Home Falls can cause injuries. They can happen to people of all ages. There are many things you can do to make your home safe and to help prevent falls. What can I do on the outside of my home? Regularly fix the edges of walkways and driveways and fix any cracks. Remove anything that might make you trip as you walk through a door, such as a raised step or threshold. Trim any bushes or trees on the path to your home. Use bright outdoor  lighting. Clear any walking paths of anything that might make someone trip, such as rocks or tools. Regularly check to see if handrails are loose or broken. Make sure that both sides of any steps have handrails. Any raised decks and porches should have guardrails on the edges. Have any leaves, snow, or ice cleared regularly. Use sand or salt on walking paths during winter. Clean up any spills in your garage right away. This includes oil or grease spills. What can I do in the bathroom? Use night lights. Install grab bars by the toilet and in the tub and shower. Do not use towel bars as grab bars. Use non-skid mats or decals in the tub or shower. If you need to sit down in the shower, use a plastic, non-slip stool. Keep the floor dry. Clean up any water that spills on the floor as soon as it happens. Remove soap buildup in the tub or shower regularly. Attach bath mats securely with double-sided non-slip rug tape. Do not have throw rugs and other things on the floor that can make you trip. What can I do in the bedroom? Use night lights. Make sure that you have a light by your bed that is easy to reach. Do not use any  sheets or blankets that are too big for your bed. They should not hang down onto the floor. Have a firm chair that has side arms. You can use this for support while you get dressed. Do not have throw rugs and other things on the floor that can make you trip. What can I do in the kitchen? Clean up any spills right away. Avoid walking on wet floors. Keep items that you use a lot in easy-to-reach places. If you need to reach something above you, use a strong step stool that has a grab bar. Keep electrical cords out of the way. Do not use floor polish or wax that makes floors slippery. If you must use wax, use non-skid floor wax. Do not have throw rugs and other things on the floor that can make you trip. What can I do with my stairs? Do not leave any items on the stairs. Make  sure that there are handrails on both sides of the stairs and use them. Fix handrails that are broken or loose. Make sure that handrails are as long as the stairways. Check any carpeting to make sure that it is firmly attached to the stairs. Fix any carpet that is loose or worn. Avoid having throw rugs at the top or bottom of the stairs. If you do have throw rugs, attach them to the floor with carpet tape. Make sure that you have a light switch at the top of the stairs and the bottom of the stairs. If you do not have them, ask someone to add them for you. What else can I do to help prevent falls? Wear shoes that: Do not have high heels. Have rubber bottoms. Are comfortable and fit you well. Are closed at the toe. Do not wear sandals. If you use a stepladder: Make sure that it is fully opened. Do not climb a closed stepladder. Make sure that both sides of the stepladder are locked into place. Ask someone to hold it for you, if possible. Clearly mark and make sure that you can see: Any grab bars or handrails. First and last steps. Where the edge of each step is. Use tools that help you move around (mobility aids) if they are needed. These include: Canes. Walkers. Scooters. Crutches. Turn on the lights when you go into a dark area. Replace any light bulbs as soon as they burn out. Set up your furniture so you have a clear path. Avoid moving your furniture around. If any of your floors are uneven, fix them. If there are any pets around you, be aware of where they are. Review your medicines with your doctor. Some medicines can make you feel dizzy. This can increase your chance of falling. Ask your doctor what other things that you can do to help prevent falls. This information is not intended to replace advice given to you by your health care provider. Make sure you discuss any questions you have with your health care provider. Document Released: 05/27/2009 Document Revised: 01/06/2016  Document Reviewed: 09/04/2014 Elsevier Interactive Patient Education  2017 [employer].

## 2023-03-24 NOTE — Progress Notes (Signed)
Subjective:   Patricia White is a 52 y.o. female who presents for Medicare Annual (Subsequent) preventive examination.  Visit Complete: Virtual  I connected with  Patricia White on 03/23/23 by a audio enabled telemedicine application and verified that I am speaking with the correct person using two identifiers.  Patient Location: Home  Provider Location: Home Office  I discussed the limitations of evaluation and management by telemedicine. The patient expressed understanding and agreed to proceed.  Vital Signs: Unable to obtain new vitals due to this being a telehealth visit.  Review of Systems     Cardiac Risk Factors include: diabetes mellitus;dyslipidemia;sedentary lifestyle     Objective:    Today's Vitals   03/24/23 1748  Weight: 173 lb (78.5 kg)  Height: 5\' 3"  (1.6 m)   Body mass index is 30.65 kg/m.     03/24/2023    5:59 PM 03/02/2022    2:24 PM 02/17/2022    1:29 PM 09/22/2021    2:27 PM 06/29/2021   10:41 AM 03/03/2021    2:50 PM 11/23/2020   10:24 AM  Advanced Directives  Does Patient Have a Medical Advance Directive? No No No No No No No  Would patient like information on creating a medical advance directive? Yes (MAU/Ambulatory/Procedural Areas - Information given) No - Patient declined No - Patient declined No - Patient declined No - Patient declined No - Patient declined No - Patient declined    Current Medications (verified) Outpatient Encounter Medications as of 03/23/2023  Medication Sig   Accu-Chek FastClix Lancets MISC 1 Device by Does not apply route as directed.   atorvastatin (LIPITOR) 40 MG tablet Take 1 tablet (40 mg total) by mouth daily.   benzonatate (TESSALON) 100 MG capsule Take 1 capsule (100 mg total) by mouth 3 (three) times daily as needed for cough.   DEPAKOTE ER 250 MG 24 hr tablet Take 2 tablets (500 mg total) by mouth 2 (two) times daily.   levocetirizine (XYZAL) 5 MG tablet TAKE 1 TABLET BY MOUTH 2 TIMES DAILY   losartan (COZAAR)  100 MG tablet Take 0.5 tablets (50 mg total) by mouth at bedtime.   potassium chloride (KLOR-CON) 10 MEQ tablet Take 1 tablet (10 mEq total) by mouth daily.   TEGRETOL 200 MG tablet TAKE 1 AND 1/2 TABLETS BY MOUTH 2 TIMES DAILY in the morning and at bedtime   TOPAMAX 25 MG tablet TAKE 3 TABLETS BY MOUTH 2 TIMES DAILY   Facility-Administered Encounter Medications as of 03/23/2023  Medication   omalizumab Geoffry Paradise) prefilled syringe 300 mg   omalizumab Geoffry Paradise) prefilled syringe 300 mg    Allergies (verified) Patient has no known allergies.   History: Past Medical History:  Diagnosis Date   Asthma    Cerebral palsy (HCC) Birth   Mental retardation Birth   Seizures (HCC)    No past surgical history on file. Family History  Problem Relation Age of Onset   Migraines Mother    Cancer Mother        liver cancer   Diabetes Maternal Aunt    Hypertension Maternal Grandmother    Colon cancer Maternal Grandfather 60   Prostate cancer Maternal Grandfather    Throat cancer Maternal Grandfather    Social History   Socioeconomic History   Marital status: Single    Spouse name: Not on file   Number of children: 0   Years of education: dis.school   Highest education level: Not on file  Occupational History  Comment: disabled0  Tobacco Use   Smoking status: Never   Smokeless tobacco: Never  Vaping Use   Vaping status: Never Used  Substance and Sexual Activity   Alcohol use: No   Drug use: No   Sexual activity: Never  Other Topics Concern   Not on file  Social History Narrative   Patient is right handed      Current Social History 04/13/2017           Patient lives with grandmother([name] Mila Palmer), maternal aunt Raelie Higley), female cousin and maternal uncle in one level home 04/13/2017   Transportation: Patient relies on aunt for transportation 04/13/2017   Important Relationships Family 04/13/2017    Pets: [place], "Harmony" 04/13/2017   Education / Work:  12 th grade/  Disabled 04/13/2017   Interests / Fun: Going to [employer], out for pizza, scary movies 04/13/2017   Religious / Personal Beliefs: Attends Non-Denominational Church 04/13/2017   L. Leward Quan, RN, BSN                                                                                                 Social Determinants of Health   Financial Resource Strain: Low Risk  (03/24/2023)   Overall Financial Resource Strain (CARDIA)    Difficulty of Paying Living Expenses: Not hard at all  Food Insecurity: No Food Insecurity (03/24/2023)   Hunger Vital Sign    Worried About Running Out of Food in the Last Year: Never true    Ran Out of Food in the Last Year: Never true  Transportation Needs: No Transportation Needs (03/24/2023)   PRAPARE - Administrator, Civil Service (Medical): No    Lack of Transportation (Non-Medical): No  Physical Activity: Insufficiently Active (03/24/2023)   Exercise Vital Sign    Days of Exercise per Week: 2 days    Minutes of Exercise per Session: 10 min  Stress: No Stress Concern Present (03/24/2023)   [employer] of Occupational Health - Occupational Stress Questionnaire    Feeling of Stress : Not at all  Social Connections: Moderately Isolated (03/24/2023)   Social Connection and Isolation Panel [NHANES]    Frequency of Communication with Friends and Family: More than three times a week    Frequency of Social Gatherings with Friends and Family: Three times a week    Attends Religious Services: 1 to 4 times per year    Active Member of Clubs or Organizations: No    Attends Banker Meetings: Never    Marital Status: Never married    Tobacco Counseling Counseling given: Not Answered   Clinical Intake:  Pre-visit preparation completed: Yes  Pain : No/denies pain     Diabetes: Yes CBG done?: No Did pt. bring in CBG monitor from home?: No  How often do you need to have someone help you when you read instructions, pamphlets, or other  written materials from your doctor or pharmacy?: 1 - Never  Interpreter Needed?: No  Comments: Assisted with visit by guardian: [name] Information entered by :: [name] LPN   Activities of Daily Living  03/24/2023    5:58 PM  In your present state of health, do you have any difficulty performing the following activities:  Hearing? 0  Vision? 0  Difficulty concentrating or making decisions? 1  Walking or climbing stairs? 1  Dressing or bathing? 0  Doing errands, shopping? 1  Preparing Food and eating ? N  Using the Toilet? N  In the past six months, have you accidently leaked urine? N  Do you have problems with loss of bowel control? N  Managing your Medications? Y  Managing your Finances? Y  Housekeeping or managing your Housekeeping? Y    Patient Care Team: Celine Mans, MD as PCP - General (Family Medicine) Nilda Riggs, NP as Nurse Practitioner (Neurology)  Indicate any recent Medical Services you may have received from other than [facility] providers in the past year (date may be approximate).     Assessment:   This is a routine wellness examination for Tully.  Hearing/Vision screen Hearing Screening - Comments:: Denies hearing difficulties   Vision Screening - Comments:: No vision problems; will schedule routine eye exam soon    Dietary issues and exercise activities discussed:     Goals Addressed   None   Depression Screen    03/24/2023    5:54 PM 02/12/2023    2:19 PM 02/17/2022    1:28 PM 09/22/2021    5:31 PM 09/22/2021    2:27 PM 06/29/2021   10:41 AM 03/03/2021    2:50 PM  PHQ 2/9 Scores  PHQ - 2 Score 0 0 1 0 0 0 0  PHQ- 9 Score   4 0 0 0 0    Fall Risk    03/24/2023    5:55 PM 02/12/2023    1:56 PM 09/22/2021    2:27 PM 12/10/2019   11:08 AM 10/06/2019    1:59 PM  Fall Risk   Falls in the past year? 0 0 0 0 0  Number falls in past yr: 0 0 0 0 0  Injury with Fall? 0 0 0 0 0  Risk for fall due to : No Fall Risks No Fall Risks    No Fall Risks  Follow up Falls prevention discussed;Education provided;Falls evaluation completed    Falls evaluation completed;Education provided    MEDICARE RISK AT HOME:  Medicare Risk at Home - 03/24/23 1756     Any stairs in or around the home? No    If so, are there any without handrails? No    Home free of loose throw rugs in walkways, pet beds, electrical cords, etc? Yes    Adequate lighting in your home to reduce risk of falls? Yes    Life alert? No    Use of a cane, walker or w/c? No    Grab bars in the bathroom? Yes    Shower chair or bench in shower? Yes    Elevated toilet seat or a handicapped toilet? Yes             TIMED UP AND GO:  Was the test performed?  No    Cognitive Function:        03/24/2023    5:59 PM  6CIT Screen  What Year? 0 points  What month? 0 points  What time? 0 points  Count back from 20 0 points  Months in reverse 0 points  Repeat phrase 0 points  Total Score 0 points    Immunizations Immunization History  Administered Date(s) Administered   Influenza  Split 05/12/2011, 07/01/2012   Influenza,inj,Quad PF,6+ Mos 06/11/2013, 06/25/2014, 06/01/2015, 06/20/2016, 04/12/2017, 09/05/2018, 10/06/2019, 06/24/2020, 06/29/2021, 09/25/2022   Moderna Sars-Covid-2 Vaccination 10/27/2019, 11/24/2019   PFIZER(Purple Top)SARS-COV-2 Vaccination 06/24/2020   Pfizer Covid-19 Vaccine Bivalent Booster 39yrs & up 06/29/2021   Td 04/14/2002   Tdap 09/17/2018    TDAP status: Up to date  Flu Vaccine status: Due, Education has been provided regarding the importance of this vaccine. Advised may receive this vaccine at local pharmacy or Health Dept. Aware to provide a copy of the vaccination record if obtained from local pharmacy or Health Dept. Verbalized acceptance and understanding.  Pneumococcal vaccine status: Up to date  Covid-19 vaccine status: Information provided on how to obtain vaccines.   Qualifies for Shingles Vaccine? Yes   Zostavax  completed No   Shingrix Completed?: No.    Education has been provided regarding the importance of this vaccine. Patient has been advised to call insurance company to determine out of pocket expense if they have not yet received this vaccine. Advised may also receive vaccine at local pharmacy or Health Dept. Verbalized acceptance and understanding.  Screening Tests Health Maintenance  Topic Date Due   OPHTHALMOLOGY EXAM  Never done   Diabetic kidney evaluation - Urine ACR  Never done   PAP SMEAR-Modifier  12/17/1993   FOOT EXAM  03/03/2022   COVID-19 Vaccine (5 - 2023-24 season) 04/14/2022   MAMMOGRAM  Never done   Zoster Vaccines- Shingrix (1 of 2) Never done   Diabetic kidney evaluation - eGFR measurement  03/03/2023   INFLUENZA VACCINE  03/15/2023   HEMOGLOBIN A1C  08/15/2023   Medicare Annual Wellness (AWV)  03/22/2024   DTaP/Tdap/Td (3 - Td or Tdap) 09/17/2028   Colonoscopy  09/15/2029   Hepatitis C Screening  Completed   HIV Screening  Completed   HPV VACCINES  Aged Out    Health Maintenance  Health Maintenance Due  Topic Date Due   OPHTHALMOLOGY EXAM  Never done   Diabetic kidney evaluation - Urine ACR  Never done   PAP SMEAR-Modifier  12/17/1993   FOOT EXAM  03/03/2022   COVID-19 Vaccine (5 - 2023-24 season) 04/14/2022   MAMMOGRAM  Never done   Zoster Vaccines- Shingrix (1 of 2) Never done   Diabetic kidney evaluation - eGFR measurement  03/03/2023   INFLUENZA VACCINE  03/15/2023    Colorectal cancer screening: Type of screening: Colonoscopy. Completed 09/16/19. Repeat every 10 years  Mammogram status:  Declines at this time  Lung Cancer Screening: (Low Dose CT Chest recommended if Age 36-80 years, 20 pack-year currently smoking OR have quit w/in 15years.) does not qualify.   Lung Cancer Screening Referral: n/a  Additional Screening:  Hepatitis C Screening: does qualify; Completed 07/25/21  Vision Screening: Recommended annual ophthalmology exams for early  detection of glaucoma and other disorders of the eye. Is the patient up to date with their annual eye exam?  No  Who is the provider or what is the name of the office in which the patient attends annual eye exams? none If pt is not established with a provider, would they like to be referred to a provider to establish care? No .   Dental Screening: Recommended annual dental exams for proper oral hygiene  Diabetic Foot Exam: Diabetic Foot Exam: Overdue, Pt has been advised about the importance in completing this exam. Pt is scheduled for diabetic foot exam on at next office visit .  Community Resource Referral / Chronic Care Management: CRR required  this visit?  No   CCM required this visit?  No     Plan:     I have personally reviewed and noted the following in the patient's chart:   Medical and social history Use of alcohol, tobacco or illicit drugs  Current medications and supplements including opioid prescriptions. Patient is not currently taking opioid prescriptions. Functional ability and status Nutritional status Physical activity Advanced directives List of other physicians Hospitalizations, surgeries, and ER visits in previous 12 months Vitals Screenings to include cognitive, depression, and falls Referrals and appointments  In addition, I have reviewed and discussed with patient certain preventive protocols, quality metrics, and best practice recommendations. A written personalized care plan for preventive services as well as general preventive health recommendations were provided to patient.     [name] [place], [place]   1/61/0960   After Visit Summary: (Mail) Due to this being a telephonic visit, the after visit summary with patients personalized plan was offered to patient via mail   Nurse Notes: No concerns at this time

## 2023-04-25 ENCOUNTER — Other Ambulatory Visit: Payer: Self-pay | Admitting: Student

## 2023-04-25 DIAGNOSIS — I10 Essential (primary) hypertension: Secondary | ICD-10-CM

## 2023-05-09 ENCOUNTER — Other Ambulatory Visit: Payer: Self-pay | Admitting: Adult Health

## 2023-05-09 ENCOUNTER — Other Ambulatory Visit: Payer: Self-pay | Admitting: Diagnostic Neuroimaging

## 2023-05-09 DIAGNOSIS — G40909 Epilepsy, unspecified, not intractable, without status epilepticus: Secondary | ICD-10-CM

## 2023-05-23 ENCOUNTER — Other Ambulatory Visit: Payer: Self-pay | Admitting: Adult Health

## 2023-05-23 DIAGNOSIS — G40909 Epilepsy, unspecified, not intractable, without status epilepticus: Secondary | ICD-10-CM

## 2023-06-04 ENCOUNTER — Ambulatory Visit: Payer: 59 | Admitting: Family Medicine

## 2023-06-04 ENCOUNTER — Ambulatory Visit: Payer: 59 | Admitting: Student

## 2023-06-12 ENCOUNTER — Encounter: Payer: Self-pay | Admitting: Family Medicine

## 2023-06-12 ENCOUNTER — Ambulatory Visit: Payer: 59 | Admitting: Family Medicine

## 2023-06-12 VITALS — BP 186/114 | HR 100 | Ht 63.0 in | Wt 174.6 lb

## 2023-06-12 DIAGNOSIS — I10 Essential (primary) hypertension: Secondary | ICD-10-CM | POA: Diagnosis not present

## 2023-06-12 DIAGNOSIS — Z1231 Encounter for screening mammogram for malignant neoplasm of breast: Secondary | ICD-10-CM

## 2023-06-12 DIAGNOSIS — Z23 Encounter for immunization: Secondary | ICD-10-CM | POA: Diagnosis not present

## 2023-06-12 DIAGNOSIS — E038 Other specified hypothyroidism: Secondary | ICD-10-CM | POA: Insufficient documentation

## 2023-06-12 MED ORDER — INDAPAMIDE 1.25 MG PO TABS
1.2500 mg | ORAL_TABLET | Freq: Every day | ORAL | 3 refills | Status: DC
Start: 2023-06-12 — End: 2023-07-17

## 2023-06-12 NOTE — Progress Notes (Signed)
    SUBJECTIVE:   CHIEF COMPLAINT / HPI: check up  History provided by family member [employer]. Patient is mostly non-verbal.  HTN - Taking Losartan 100mg  daily. States they have been working on diet. Do not measure at home. Reports no new behavior change. Did fall once but patient did not inform family. This not a new occurrence as she has history due to deconditioning and chronic neurologic deficit due to cerebral palsy. Has not complained of headache, SOB or chest pain. Unsure about blurry vision. Has chronic eye issues.  Reports previously discussed not performing pap smears with her previous doctors.  PERTINENT  PMH / PSH: HTN, Cerebral palsy, Seizure disorder  OBJECTIVE:   BP (!) 186/114   Pulse 100   Ht 5\' 3"  (1.6 m)   Wt 174 lb 9.6 oz (79.2 kg)   SpO2 99%   BMI 30.93 kg/m   General: NAD Neuro: A&O, non-verbal, gait appropriate, reacts to voice HEENT: pupils equal round and reactive, symmetric faces, EOMI Cardiovascular: RRR, no murmurs, no peripheral edema Respiratory: normal WOB on RA, CTAB, no wheezes, ronchi or rales Extremities: Moving all 4 extremities equally   ASSESSMENT/PLAN:   Assessment & Plan Essential hypertension Patient with severely elevated blood pressure x 2 today in clinic.  Reassuringly she has a normal exam without signs of endorgan damage.  Suspect that it has been chronically elevated.  Discussed strict return precautions with patient's aunt.  Instructed to measure blood pressure daily and bring in log in 2 weeks.  Counseled to call clinic if blood pressure greater than 180/110.  Will check BMP today.  Add indapamide 1.25 mg daily.  Recheck BMP 2 weeks. Screening mammogram for breast cancer Screening mammogram ordered.   Return in about 2 weeks (around 06/26/2023). For BP and Health Maintenance.  Celine Mans, MD [facility]

## 2023-06-12 NOTE — Patient Instructions (Addendum)
It was great to see you! Thank you for allowing me to participate in your care!  Our plans for today:  - Please take your blood pressure daily, write this down, and bring in to next visit. - If her blood pressure is greater than 180/110, than please call to make a same day appointment. - I will let you know the results of her labs. - Please start taking indapamide 1.25mg  daily. - Please make a follow-up appointment in 2 weeks for your blood pressure. - I have ordered a mammogram, you may go to the breast center at any time to do this.  Please arrive 15 minutes PRIOR to your next scheduled appointment time! If you do not, this affects OTHER patients' care.  Take care and seek immediate care sooner if you develop any concerns.   Celine Mans, MD, PGY-2 [facility] 10:29 AM 06/12/2023  [facility]

## 2023-06-12 NOTE — Assessment & Plan Note (Signed)
Patient with severely elevated blood pressure x 2 today in clinic.  Reassuringly she has a normal exam without signs of endorgan damage.  Suspect that it has been chronically elevated.  Discussed strict return precautions with patient's aunt.  Instructed to measure blood pressure daily and bring in log in 2 weeks.  Counseled to call clinic if blood pressure greater than 180/110.  Will check BMP today.  Add indapamide 1.25 mg daily.  Recheck BMP 2 weeks.

## 2023-06-13 LAB — BASIC METABOLIC PANEL
BUN/Creatinine Ratio: 6 — ABNORMAL LOW (ref 9–23)
BUN: 5 mg/dL — ABNORMAL LOW (ref 6–24)
CO2: 22 mmol/L (ref 20–29)
Calcium: 9.2 mg/dL (ref 8.7–10.2)
Chloride: 102 mmol/L (ref 96–106)
Creatinine, Ser: 0.82 mg/dL (ref 0.57–1.00)
Glucose: 81 mg/dL (ref 70–99)
Potassium: 3.5 mmol/L (ref 3.5–5.2)
Sodium: 140 mmol/L (ref 134–144)
eGFR: 87 mL/min/{1.73_m2} (ref >59)

## 2023-06-28 ENCOUNTER — Other Ambulatory Visit: Payer: Self-pay | Admitting: Adult Health

## 2023-06-28 ENCOUNTER — Ambulatory Visit: Payer: Medicare Other | Admitting: Adult Health

## 2023-06-28 DIAGNOSIS — G40909 Epilepsy, unspecified, not intractable, without status epilepticus: Secondary | ICD-10-CM

## 2023-07-04 ENCOUNTER — Other Ambulatory Visit: Payer: Self-pay | Admitting: Family Medicine

## 2023-07-04 DIAGNOSIS — I10 Essential (primary) hypertension: Secondary | ICD-10-CM

## 2023-07-06 ENCOUNTER — Telehealth: Payer: Self-pay

## 2023-07-06 NOTE — Telephone Encounter (Signed)
Received the following staff message from Dr. Velna Ochs.   Celine Mans, MD  P [facility] Rn Team Good morning,  Can we please schedule this patient for BP follow-up? Thank you!  Jamesetta Orleans patient at both numbers on file to schedule follow up. She did not answer and I was unable to LVM.   Will attempt to reach patient at later time.   Veronda Prude, RN

## 2023-07-10 NOTE — Telephone Encounter (Signed)
Called patient again to attempt to schedule follow up. VM not active.   Will also send patient mychart message.   Veronda Prude, RN

## 2023-07-15 ENCOUNTER — Ambulatory Visit (HOSPITAL_COMMUNITY)
Admission: EM | Admit: 2023-07-15 | Discharge: 2023-07-15 | Disposition: A | Discharge status: Home / Self Care | Payer: 59 | Attending: Internal Medicine | Admitting: Internal Medicine

## 2023-07-15 ENCOUNTER — Encounter (HOSPITAL_COMMUNITY): Payer: Self-pay | Admitting: *Deleted

## 2023-07-15 ENCOUNTER — Encounter (HOSPITAL_COMMUNITY): Payer: Self-pay | Admitting: Emergency Medicine

## 2023-07-15 ENCOUNTER — Other Ambulatory Visit: Payer: Self-pay

## 2023-07-15 ENCOUNTER — Inpatient Hospital Stay (HOSPITAL_COMMUNITY)
Admission: EM | Admit: 2023-07-15 | Discharge: 2023-07-17 | DRG: 684 | Disposition: A | Discharge status: Home / Self Care | Payer: 59 | Source: Ambulatory Visit | Attending: Family Medicine | Admitting: Family Medicine

## 2023-07-15 ENCOUNTER — Ambulatory Visit (HOSPITAL_COMMUNITY): Payer: 59

## 2023-07-15 ENCOUNTER — Emergency Department (HOSPITAL_COMMUNITY): Payer: 59

## 2023-07-15 ENCOUNTER — Ambulatory Visit (INDEPENDENT_AMBULATORY_CARE_PROVIDER_SITE_OTHER): Payer: 59

## 2023-07-15 DIAGNOSIS — E119 Type 2 diabetes mellitus without complications: Secondary | ICD-10-CM | POA: Diagnosis not present

## 2023-07-15 DIAGNOSIS — R051 Acute cough: Secondary | ICD-10-CM

## 2023-07-15 DIAGNOSIS — D649 Anemia, unspecified: Secondary | ICD-10-CM | POA: Diagnosis not present

## 2023-07-15 DIAGNOSIS — Z79899 Other long term (current) drug therapy: Secondary | ICD-10-CM

## 2023-07-15 DIAGNOSIS — N179 Acute kidney failure, unspecified: Secondary | ICD-10-CM | POA: Diagnosis not present

## 2023-07-15 DIAGNOSIS — R7981 Abnormal blood-gas level: Secondary | ICD-10-CM

## 2023-07-15 DIAGNOSIS — R9389 Abnormal findings on diagnostic imaging of other specified body structures: Secondary | ICD-10-CM | POA: Diagnosis not present

## 2023-07-15 DIAGNOSIS — R0981 Nasal congestion: Secondary | ICD-10-CM | POA: Diagnosis not present

## 2023-07-15 DIAGNOSIS — R0989 Other specified symptoms and signs involving the circulatory and respiratory systems: Secondary | ICD-10-CM | POA: Diagnosis not present

## 2023-07-15 DIAGNOSIS — J45909 Unspecified asthma, uncomplicated: Secondary | ICD-10-CM | POA: Diagnosis present

## 2023-07-15 DIAGNOSIS — N200 Calculus of kidney: Secondary | ICD-10-CM | POA: Diagnosis present

## 2023-07-15 DIAGNOSIS — E785 Hyperlipidemia, unspecified: Secondary | ICD-10-CM | POA: Diagnosis not present

## 2023-07-15 DIAGNOSIS — Z6830 Body mass index (BMI) 30.0-30.9, adult: Secondary | ICD-10-CM

## 2023-07-15 DIAGNOSIS — E876 Hypokalemia: Secondary | ICD-10-CM | POA: Diagnosis not present

## 2023-07-15 DIAGNOSIS — Z66 Do not resuscitate: Secondary | ICD-10-CM | POA: Diagnosis present

## 2023-07-15 DIAGNOSIS — I10 Essential (primary) hypertension: Secondary | ICD-10-CM | POA: Diagnosis present

## 2023-07-15 DIAGNOSIS — R059 Cough, unspecified: Secondary | ICD-10-CM | POA: Diagnosis present

## 2023-07-15 DIAGNOSIS — R000 Tachycardia, unspecified: Secondary | ICD-10-CM | POA: Diagnosis not present

## 2023-07-15 DIAGNOSIS — R8271 Bacteriuria: Secondary | ICD-10-CM | POA: Diagnosis present

## 2023-07-15 DIAGNOSIS — N3000 Acute cystitis without hematuria: Secondary | ICD-10-CM | POA: Diagnosis not present

## 2023-07-15 DIAGNOSIS — G809 Cerebral palsy, unspecified: Secondary | ICD-10-CM | POA: Diagnosis not present

## 2023-07-15 DIAGNOSIS — E669 Obesity, unspecified: Secondary | ICD-10-CM | POA: Diagnosis present

## 2023-07-15 DIAGNOSIS — F79 Unspecified intellectual disabilities: Secondary | ICD-10-CM | POA: Diagnosis present

## 2023-07-15 DIAGNOSIS — Z1152 Encounter for screening for COVID-19: Secondary | ICD-10-CM | POA: Diagnosis not present

## 2023-07-15 DIAGNOSIS — G40909 Epilepsy, unspecified, not intractable, without status epilepticus: Secondary | ICD-10-CM | POA: Diagnosis present

## 2023-07-15 DIAGNOSIS — B349 Viral infection, unspecified: Secondary | ICD-10-CM | POA: Diagnosis present

## 2023-07-15 LAB — CBC WITH DIFFERENTIAL/PLATELET
Abs Immature Granulocytes: 0.01 10*3/uL (ref 0.00–0.07)
Basophils Absolute: 0 10*3/uL (ref 0.0–0.1)
Basophils Relative: 0 %
Eosinophils Absolute: 0.4 10*3/uL (ref 0.0–0.5)
Eosinophils Relative: 5 %
HCT: 34.7 % — ABNORMAL LOW (ref 36.0–46.0)
Hemoglobin: 11.1 g/dL — ABNORMAL LOW (ref 12.0–15.0)
Immature Granulocytes: 0 %
Lymphocytes Relative: 25 %
Lymphs Abs: 1.7 10*3/uL (ref 0.7–4.0)
MCH: 29.6 pg (ref 26.0–34.0)
MCHC: 32 g/dL (ref 30.0–36.0)
MCV: 92.5 fL (ref 80.0–100.0)
Monocytes Absolute: 0.6 10*3/uL (ref 0.1–1.0)
Monocytes Relative: 9 %
Neutro Abs: 4.3 10*3/uL (ref 1.7–7.7)
Neutrophils Relative %: 61 %
Platelets: 284 10*3/uL (ref 150–400)
RBC: 3.75 MIL/uL — ABNORMAL LOW (ref 3.87–5.11)
RDW: 12.9 % (ref 11.5–15.5)
WBC: 7 10*3/uL (ref 4.0–10.5)
nRBC: 0 % (ref 0.0–0.2)

## 2023-07-15 LAB — URINALYSIS, ROUTINE W REFLEX MICROSCOPIC
Bilirubin Urine: NEGATIVE
Glucose, UA: NEGATIVE mg/dL
Ketones, ur: NEGATIVE mg/dL
Nitrite: NEGATIVE
Protein, ur: NEGATIVE mg/dL
Specific Gravity, Urine: 1.011 (ref 1.005–1.030)
WBC, UA: >50 WBC/hpf (ref 0–5)
pH: 5 (ref 5.0–8.0)

## 2023-07-15 LAB — COMPREHENSIVE METABOLIC PANEL
ALT: 15 U/L (ref 0–44)
AST: 64 U/L — ABNORMAL HIGH (ref 15–41)
Albumin: 2.7 g/dL — ABNORMAL LOW (ref 3.5–5.0)
Alkaline Phosphatase: 79 U/L (ref 38–126)
Anion gap: 10 (ref 5–15)
BUN: 13 mg/dL (ref 6–20)
CO2: 28 mmol/L (ref 22–32)
Calcium: 8.9 mg/dL (ref 8.9–10.3)
Chloride: 96 mmol/L — ABNORMAL LOW (ref 98–111)
Creatinine, Ser: 1.74 mg/dL — ABNORMAL HIGH (ref 0.44–1.00)
GFR, Estimated: 35 mL/min — ABNORMAL LOW (ref >60)
Glucose, Bld: 118 mg/dL — ABNORMAL HIGH (ref 70–99)
Potassium: 2.7 mmol/L — CL (ref 3.5–5.1)
Sodium: 134 mmol/L — ABNORMAL LOW (ref 135–145)
Total Bilirubin: 0.6 mg/dL (ref <1.2)
Total Protein: 9.1 g/dL — ABNORMAL HIGH (ref 6.5–8.1)

## 2023-07-15 LAB — RESP PANEL BY RT-PCR (RSV, FLU A&B, COVID)  RVPGX2
Influenza A by PCR: NEGATIVE
Influenza B by PCR: NEGATIVE
Resp Syncytial Virus by PCR: NEGATIVE
SARS Coronavirus 2 by RT PCR: NEGATIVE

## 2023-07-15 MED ORDER — POTASSIUM CHLORIDE 10 MEQ/100ML IV SOLN
10.0000 meq | INTRAVENOUS | Status: AC
  Administered 2023-07-16 (×2): 10 meq via INTRAVENOUS
  Filled 2023-07-15 (×3): qty 100

## 2023-07-15 MED ORDER — ONDANSETRON HCL 4 MG PO TABS
4.0000 mg | ORAL_TABLET | Freq: Once | ORAL | Status: AC
  Administered 2023-07-16: 4 mg via ORAL
  Filled 2023-07-15: qty 1

## 2023-07-15 MED ORDER — DM-GUAIFENESIN ER 30-600 MG PO TB12
1.0000 | ORAL_TABLET | Freq: Two times a day (BID) | ORAL | Status: DC
  Administered 2023-07-16: 1 via ORAL
  Filled 2023-07-15: qty 1

## 2023-07-15 MED ORDER — LEVOFLOXACIN 500 MG PO TABS: 500.0000 mg | ORAL_TABLET | Freq: Every day | ORAL | 0 refills | Status: DC

## 2023-07-15 MED ORDER — IPRATROPIUM-ALBUTEROL 0.5-2.5 (3) MG/3ML IN SOLN
3.0000 mL | Freq: Once | RESPIRATORY_TRACT | Status: AC
Start: 2023-07-15 — End: 2023-07-15
  Administered 2023-07-15: 3 mL via RESPIRATORY_TRACT

## 2023-07-15 MED ORDER — IPRATROPIUM-ALBUTEROL 0.5-2.5 (3) MG/3ML IN SOLN
RESPIRATORY_TRACT | Status: AC
  Filled 2023-07-15: qty 3

## 2023-07-15 MED ORDER — POTASSIUM CHLORIDE CRYS ER 20 MEQ PO TBCR
40.0000 meq | EXTENDED_RELEASE_TABLET | Freq: Once | ORAL | Status: AC
  Administered 2023-07-16: 40 meq via ORAL
  Filled 2023-07-15: qty 2

## 2023-07-15 MED ORDER — CARBAMAZEPINE 100 MG PO CHEW
300.0000 mg | CHEWABLE_TABLET | Freq: Two times a day (BID) | ORAL | Status: DC
  Administered 2023-07-16 – 2023-07-17 (×3): 300 mg via ORAL
  Filled 2023-07-15 (×4): qty 3

## 2023-07-15 MED ORDER — ENOXAPARIN SODIUM 40 MG/0.4ML IJ SOSY
40.0000 mg | PREFILLED_SYRINGE | INTRAMUSCULAR | Status: DC
  Administered 2023-07-16 – 2023-07-17 (×2): 40 mg via SUBCUTANEOUS
  Filled 2023-07-15 (×2): qty 0.4

## 2023-07-15 MED ORDER — CARBAMAZEPINE 200 MG PO TABS: 200.0000 mg | ORAL_TABLET | Freq: Two times a day (BID) | ORAL | Status: DC

## 2023-07-15 MED ORDER — DIVALPROEX SODIUM ER 500 MG PO TB24
500.0000 mg | ORAL_TABLET | Freq: Two times a day (BID) | ORAL | Status: DC
Start: 2023-07-16 — End: 2023-07-17
  Administered 2023-07-16 – 2023-07-17 (×3): 500 mg via ORAL
  Filled 2023-07-15 (×4): qty 1

## 2023-07-15 MED ORDER — BENZONATATE 100 MG PO CAPS: 100.0000 mg | ORAL_CAPSULE | Freq: Three times a day (TID) | ORAL | 0 refills | Status: DC | PRN

## 2023-07-15 MED ORDER — LEVOCETIRIZINE DIHYDROCHLORIDE 5 MG PO TABS: 5.0000 mg | ORAL_TABLET | Freq: Two times a day (BID) | ORAL | Status: DC

## 2023-07-15 MED ORDER — SODIUM CHLORIDE 0.9 % IV BOLUS: 1000.0000 mL | Freq: Once | INTRAVENOUS | Status: DC

## 2023-07-15 MED ORDER — TOPIRAMATE 25 MG PO TABS
75.0000 mg | ORAL_TABLET | Freq: Two times a day (BID) | ORAL | Status: DC
  Administered 2023-07-16 – 2023-07-17 (×3): 75 mg via ORAL
  Filled 2023-07-15 (×3): qty 3

## 2023-07-15 MED ORDER — CETIRIZINE HCL 10 MG PO TABS
10.0000 mg | ORAL_TABLET | Freq: Every day | ORAL | Status: DC
  Administered 2023-07-16 – 2023-07-17 (×2): 10 mg via ORAL
  Filled 2023-07-15 (×2): qty 1

## 2023-07-15 MED ORDER — FAMOTIDINE 20 MG PO TABS
20.0000 mg | ORAL_TABLET | Freq: Two times a day (BID) | ORAL | Status: DC
  Administered 2023-07-16 – 2023-07-17 (×4): 20 mg via ORAL
  Filled 2023-07-15 (×4): qty 1

## 2023-07-15 MED ORDER — SODIUM CHLORIDE 0.9 % IV SOLN
1.0000 g | Freq: Once | INTRAVENOUS | Status: AC
  Administered 2023-07-16: 1 g via INTRAVENOUS
  Filled 2023-07-15: qty 10

## 2023-07-15 MED ORDER — BENZONATATE 100 MG PO CAPS
100.0000 mg | ORAL_CAPSULE | Freq: Once | ORAL | Status: AC
  Administered 2023-07-15: 100 mg via ORAL
  Filled 2023-07-15: qty 1

## 2023-07-15 MED ORDER — LACTATED RINGERS IV BOLUS: 1000.0000 mL | Freq: Once | INTRAVENOUS | Status: DC

## 2023-07-15 MED ORDER — ALBUTEROL SULFATE HFA 108 (90 BASE) MCG/ACT IN AERS
2.0000 | INHALATION_SPRAY | RESPIRATORY_TRACT | Status: DC | PRN
Start: 2023-07-15 — End: 2023-07-16

## 2023-07-15 MED ORDER — ATORVASTATIN CALCIUM 40 MG PO TABS
40.0000 mg | ORAL_TABLET | Freq: Every day | ORAL | Status: DC
  Administered 2023-07-16 – 2023-07-17 (×2): 40 mg via ORAL
  Filled 2023-07-15 (×2): qty 1

## 2023-07-15 NOTE — Discharge Instructions (Addendum)
Given the new information that the patient is not eating or drinking well along with the cough, congestion and lower oxygen levels, we recommend evaluation at the emergency room.

## 2023-07-15 NOTE — ED Provider Notes (Cosign Needed Addendum)
[place] [facility] AT [facility] Provider Note   CSN: [id number] Arrival date & time: 07/15/23  1736     History  Chief Complaint  Patient presents with   Pneumonia    Patricia White is a 52 y.o. female history of infantile CP with developmental delay, seizures, asthma, hypertension, diabetes presented for a cough for the past week.  Cough has been nonproductive according to the caregivers patient is unable to provide history.  Caregiver states that patient has had nonproductive cough with no fever but has also had decreased appetite over the past few days including decreased water intake.  Patient was brought to urgent care and they diagnosed her with pneumonia based on chest x-ray however states that she would need higher level of care due to not eating.  Caregiver states that patient has appeared more fatigued than normal.  Caregiver denies any nausea or vomiting or abnormalities in bowel or urinary habits.  Level 5 caveat  Home Medications Prior to Admission medications   Medication Sig Start Date End Date Taking? Authorizing Provider  losartan (COZAAR) 100 MG tablet TAKE ONE-HALF TABLET BY MOUTH AT BEDTIME 04/26/23   Celine Mans, MD  Accu-Chek FastClix Lancets MISC 1 Device by Does not apply route as directed. 11/24/20   Mullis, Kiersten P, DO  atorvastatin (LIPITOR) 40 MG tablet Take 1 tablet (40 mg total) by mouth daily. 12/12/22   Celine Mans, MD  DEPAKOTE ER 250 MG 24 hr tablet Take 2 tablets (500 mg total) by mouth 2 (two) times daily. 12/13/22   Ihor Austin, NP  indapamide (LOZOL) 1.25 MG tablet Take 1 tablet (1.25 mg total) by mouth daily. 06/12/23   Celine Mans, MD  levocetirizine (XYZAL) 5 MG tablet TAKE 1 TABLET BY MOUTH 2 TIMES DAILY 03/17/21   Marcelyn Bruins, MD  potassium chloride (KLOR-CON) 10 MEQ tablet Take 1 tablet (10 mEq total) by mouth daily. 03/23/22   Celine Mans, MD  TEGRETOL 200 MG tablet TAKE 1 AND 1/2  TABLETS BY MOUTH 2 TIMES DAILY in the morning and at bedtime 12/13/22   Penumalli, Glenford Bayley, MD  TOPAMAX 25 MG tablet TAKE 3 TABLETS BY MOUTH 2 TIMES DAILY 09/07/22   Ihor Austin, NP      Allergies    Patient has no known allergies.    Review of Systems   Review of Systems  Physical Exam Updated Vital Signs BP 92/73   Pulse 100   Temp 97.8 F (36.6 C)   Resp (!) 24   Ht 5\' 3"  (1.6 m)   Wt 79.2 kg   SpO2 100%   BMI 30.93 kg/m  Physical Exam Constitutional:      General: She is not in acute distress.    Comments: At mental baseline Alert  HENT:     Nose: Congestion present.     Mouth/Throat:     Mouth: Mucous membranes are moist.     Pharynx: No oropharyngeal exudate or posterior oropharyngeal erythema.  Eyes:     Extraocular Movements: Extraocular movements intact.     Conjunctiva/sclera: Conjunctivae normal.     Pupils: Pupils are equal, round, and reactive to light.  Cardiovascular:     Rate and Rhythm: Regular rhythm. Tachycardia present.     Pulses: Normal pulses.     Heart sounds: Normal heart sounds.  Pulmonary:     Effort: Pulmonary effort is normal.     Breath sounds: Normal breath sounds.     Comments: Persistent  nonproductive cough Abdominal:     Palpations: Abdomen is soft.     Tenderness: There is no abdominal tenderness. There is no right CVA tenderness, left CVA tenderness, guarding or rebound.  Musculoskeletal:     Cervical back: Normal range of motion. No tenderness.  Lymphadenopathy:     Cervical: No cervical adenopathy.  Skin:    General: Skin is warm and dry.     Capillary Refill: Capillary refill takes less than 2 seconds.  Neurological:     Mental Status: Mental status is at baseline.     ED Results / Procedures / Treatments   Labs (all labs ordered are listed, but only abnormal results are displayed) Labs Reviewed  CBC WITH DIFFERENTIAL/PLATELET - Abnormal; Notable for the following components:      Result Value   RBC 3.75 (*)     Hemoglobin 11.1 (*)    HCT 34.7 (*)    All other components within normal limits  URINALYSIS, ROUTINE W REFLEX MICROSCOPIC - Abnormal; Notable for the following components:   APPearance HAZY (*)    Hgb urine dipstick SMALL (*)    Leukocytes,Ua LARGE (*)    Bacteria, UA RARE (*)    All other components within normal limits  COMPREHENSIVE METABOLIC PANEL - Abnormal; Notable for the following components:   Sodium 134 (*)    Potassium 2.7 (*)    Chloride 96 (*)    Glucose, Bld 118 (*)    Creatinine, Ser 1.74 (*)    Total Protein 9.1 (*)    Albumin 2.7 (*)    AST 64 (*)    GFR, Estimated 35 (*)    All other components within normal limits  RESP PANEL BY RT-PCR (RSV, FLU A&B, COVID)  RVPGX2  CULTURE, BLOOD (ROUTINE X 2)  CULTURE, BLOOD (ROUTINE X 2)  URINE CULTURE  LACTIC ACID, PLASMA  LACTIC ACID, PLASMA  COMPREHENSIVE METABOLIC PANEL  MAGNESIUM    EKG EKG Interpretation Date/Time:  Sunday July 15 2023 19:05:43 EST Ventricular Rate:  96 PR Interval:  149 QRS Duration:  87 QT Interval:  365 QTC Calculation: 462 R Axis:   69  Text Interpretation: Sinus rhythm Abnormal R-wave progression, early transition Confirmed by Vivi Barrack [phone]) on 07/15/2023 9:51:44 PM  Radiology DG Chest 1 View  Result Date: 07/15/2023 CLINICAL DATA:  Cough and chest congestion for 1 week. EXAM: CHEST  1 VIEW COMPARISON:  09/07/2022 FINDINGS: The heart size and mediastinal contours are within normal limits. Stable mild elevation of left hemidiaphragm. Both lungs are clear. The visualized skeletal structures are unremarkable. IMPRESSION: No active disease. Electronically Signed   By: Danae Orleans M.D.   On: 07/15/2023 17:05    Procedures .Critical Care  Performed by: Netta Corrigan, PA-C Authorized by: Netta Corrigan, PA-C   Critical care provider statement:    Critical care time (minutes):  40   Critical care time was exclusive of:  Separately billable procedures and treating other  patients   Critical care was necessary to treat or prevent imminent or life-threatening deterioration of the following conditions:  Renal failure   Critical care was time spent personally by me on the following activities:  Blood draw for specimens, development of treatment plan with patient or surrogate, discussions with consultants, evaluation of patient's response to treatment, examination of patient, obtaining history from patient or surrogate, review of old charts, re-evaluation of patient's condition, pulse oximetry, ordering and review of laboratory studies, ordering and review of radiographic studies and  ordering and performing treatments and interventions   I assumed direction of critical care for this patient from another provider in my specialty: no     Care discussed with: admitting provider       Medications Ordered in ED Medications  albuterol (VENTOLIN HFA) 108 (90 Base) MCG/ACT inhaler 2 puff (has no administration in time range)  sodium chloride 0.9 % bolus 1,000 mL (has no administration in time range)  cefTRIAXone (ROCEPHIN) 1 g in sodium chloride 0.9 % 100 mL IVPB (has no administration in time range)  benzonatate (TESSALON) capsule 100 mg (100 mg Oral Given 07/15/23 2049)    ED Course/ Medical Decision Making/ A&P                                 Medical Decision Making Amount and/or Complexity of Data Reviewed Labs: ordered. Radiology: ordered.  Risk Prescription drug management. Decision regarding hospitalization.   [name] 52 y.o. presented today for URI like symptoms. Working DDx that I considered at this time includes, but not limited to, viral illness, pharyngitis, mono, sinusitis, electrolyte abnormality, AOM, UTI/pyelonephritis, AKI, dehydration.  R/o DDx: viral illness, pharyngitis, mono, sinusitis, pneumonia: these diagnoses are not consistent with patient's history, presentation, physical exam, labs/imaging findings.  Review of prior external  notes: 07/15/2023 urgent care  Unique Tests and My Interpretation:  CBC: Unremarkable CMP: Hypokalemia 2.7, GFR 35, creatinine 1.74 Mag: Pending UA: Pyuria noted Urine culture: Pending Respiratory panel: Negative Blood cultures: Pending Lactic acid: Pending EKG: Sinus 96 bpm, no signs of ST elevation or depression noted, no blocks noted Ultrasound: Pending  Social Determinants of Health: none  Discussion with Independent Historian:  Caregiver  Discussion of Management of Tests:  Hyacinth Meeker, MD Family Medicine  Risk: High: hospitalization or escalation of hospital-level care  Risk Stratification Score: None  Staffed with Jearld Fenton, MD  Plan: On exam patient was in no acute distress but was mildly tachycardic on exam.  Patient's exam was largely unremarkable however patient does have developmental delay and send is hard to tell if patient was having any tenderness on exam.  Chest x-ray from urgent care is read by radiology and does not show pneumonia however patient is having persistent nonproductive cough in the room so we will give Tessalon along with albuterol treatment to help with her breathing.  Given patient's fatigue going to the caregiver will obtain broad workup.  Urine does show pyuria and was sent for culture and so do suspect patient may have UTI and so will treat with rocephin. Patient does not have flank pain on exam but hard to tell due to patient's mental baseline. Pending the rest of her labs plan on admitting due to patient's complicated medical history.  Patient's labs do show AKI in conjunction with hypokalemia most likely secondary to not eating over the past few days.  At this time we will start patient on Rocephin for the UTI and admit to hospitalist for admission.  Since patient is AKI with cystitis unfortunately cannot do CT with contrast and so we will get renal ultrasound.  Hospitalist consulted.  Patient stable for admission.  Will start patient on Rocephin.  Patient  given fluids for the AKI.  I spoke to the hospitalist and patient was accepted for admission.  Patient stable for admission at this time.  This chart was dictated using voice recognition software.  Despite best efforts to proofread,  errors can occur  which can change the documentation meaning.  Final Clinical Impression(s) / ED Diagnoses Final diagnoses:  AKI (acute kidney injury) (HCC)  Acute cystitis without hematuria  Hypokalemia    Rx / DC Orders ED Discharge Orders     None         Remi Deter 07/15/23 2238    Netta Corrigan, PA-C 07/15/23 2255    Loetta Rough, MD 07/17/23 [phone]

## 2023-07-15 NOTE — ED Triage Notes (Signed)
The pt was sent here from [facility] they diagnosed her with pneumonia by chest xray  she has been ill for one week  unknown temp  coughing in triage  ucc gave her a hhn there also

## 2023-07-15 NOTE — ED Notes (Signed)
Patient is being discharged from the Urgent Care and sent to the Emergency Department via pov . Per Clance Boll, NP, patient is in need of higher level of care due to hypoxia, dehydration. Patient is aware and verbalizes understanding of plan of care.  Vitals:   07/15/23 1528 07/15/23 1654  BP: 99/66   Pulse: 95   Resp: 16   Temp: 98.3 F (36.8 C)   SpO2: 92% 94%

## 2023-07-15 NOTE — ED Notes (Signed)
Phlebotomy attempted to obtain 2nd set of blood cultures, unsuccessful attempt. Candace, RN notified.

## 2023-07-15 NOTE — ED Triage Notes (Signed)
Pt presents with cough and congestion for 1 week.

## 2023-07-15 NOTE — ED Provider Notes (Signed)
[facility]    CSN: [id number] Arrival date & time: 07/15/23  1427      History   Chief Complaint No chief complaint on file.   HPI Patricia White is a 52 y.o. female.   52 year old female with a history of infantile cerebral palsy with developmental delay who is seen in urgent care secondary to cough, congestion and generally feeling unwell for about a week.  The caregiver with her reports that she has had a productive cough for the a week now and that she has not been feeling herself.  She has had decreased activity.  She has significant congestion as well.  She is eating and drinking overall well.  She denies any nausea vomiting, abdominal pain, urinary symptoms.  She does seem to be wheezing and somewhat short of breath at times.  She has had similar symptoms earlier this year in which she was treated for pneumonia.  Addendum to previous note: The caregiver with the patient contacted the patient's mother and was told that she has not been eating and drinking well for the last 5 to 7 days.  She has only been taking in small amounts of water and hardly eating at all.     Past Medical History:  Diagnosis Date   Asthma    Cerebral palsy (HCC) Birth   Mental retardation Birth   Seizures [facility])     Patient Active Problem List   Diagnosis Date Noted   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   Hot flashes due to menopause 06/30/2021   Hyperlipidemia associated with type 2 diabetes mellitus (HCC) 11/24/2020   Diabetes mellitus without complication (HCC) 11/23/2020   Hypokalemia 11/23/2020   Angioedema 12/10/2019   Lesion of mandible 12/10/2019   Seasonal allergies 11/20/2019   Essential hypertension 10/23/2019   Seizure disorder (HCC) 02/23/2016   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 No pertinent surgical  history.  OB History   No obstetric history on file.      Home Medications    Prior to Admission medications   Medication Sig Start Date End Date Taking? Authorizing Provider  losartan (COZAAR) 100 MG tablet TAKE ONE-HALF TABLET BY MOUTH AT BEDTIME 04/26/23   Celine Mans, MD  Accu-Chek FastClix Lancets MISC 1 Device by Does not apply route as directed. 11/24/20   Mullis, Kiersten P, DO  atorvastatin (LIPITOR) 40 MG tablet Take 1 tablet (40 mg total) by mouth daily. 12/12/22   Celine Mans, MD  benzonatate (TESSALON) 100 MG capsule Take 1 capsule (100 mg total) by mouth 3 (three) times daily as needed for cough. Patient not taking: Reported on 07/15/2023 09/07/22   Zenia Resides, MD  DEPAKOTE ER 250 MG 24 hr tablet Take 2 tablets (500 mg total) by mouth 2 (two) times daily. 12/13/22   Ihor Austin, NP  indapamide (LOZOL) 1.25 MG tablet Take 1 tablet (1.25 mg total) by mouth daily. 06/12/23   Celine Mans, MD  levocetirizine (XYZAL) 5 MG tablet TAKE 1 TABLET BY MOUTH 2 TIMES DAILY 03/17/21   Marcelyn Bruins, MD  potassium chloride (KLOR-CON) 10 MEQ tablet Take 1 tablet (10 mEq total) by mouth daily. 03/23/22   Celine Mans, MD  TEGRETOL 200 MG tablet TAKE 1 AND 1/2 TABLETS BY MOUTH 2 TIMES DAILY in the morning and at bedtime 12/13/22   Penumalli, Glenford Bayley, MD  TOPAMAX  25 MG tablet TAKE 3 TABLETS BY MOUTH 2 TIMES DAILY 09/07/22   Ihor Austin, NP    Family History Family History  Problem Relation Age of Onset   Migraines Mother    Cancer Mother        liver cancer   Diabetes Maternal Aunt    Hypertension Maternal Grandmother    Colon cancer Maternal Grandfather 60   Prostate cancer Maternal Grandfather    Throat cancer Maternal Grandfather     Social History Social History   Tobacco Use   Smoking status: Never   Smokeless tobacco: Never  Vaping Use   Vaping status: Never Used  Substance Use Topics   Alcohol use: No   Drug use: No     Allergies    Patient has no known allergies.   Review of Systems Review of Systems  Constitutional:  Negative for chills and fever.  HENT:  Negative for ear pain and sore throat.   Eyes:  Negative for pain and visual disturbance.  Respiratory:  Negative for cough and shortness of breath.   Cardiovascular:  Negative for chest pain and palpitations.  Gastrointestinal:  Negative for abdominal pain and vomiting.  Genitourinary:  Negative for dysuria and hematuria.  Musculoskeletal:  Negative for arthralgias and back pain.  Skin:  Negative for color change and rash.  Neurological:  Negative for seizures and syncope.  All other systems reviewed and are negative.    Physical Exam Triage Vital Signs ED Triage Vitals [07/15/23 1528]  Encounter Vitals Group     BP 99/66     Systolic BP Percentile      Diastolic BP Percentile      Pulse Rate 95     Resp 16     Temp 98.3 F (36.8 C)     Temp Source Oral     SpO2 92 %     Weight      Height      Head Circumference      Peak Flow      Pain Score      Pain Loc      Pain Education      Exclude from Growth Chart    No data found.  Updated Vital Signs BP 99/66 (BP Location: Right Arm)   Pulse 95   Temp 98.3 F (36.8 C) (Oral)   Resp 16   SpO2 92%   Visual Acuity Right Eye Distance:   Left Eye Distance:   Bilateral Distance:    Right Eye Near:   Left Eye Near:    Bilateral Near:     Physical Exam Vitals and nursing note reviewed.  Constitutional:      General: She is not in acute distress.    Appearance: She is well-developed.     Comments: Appears ill but not toxic  HENT:     Head: Normocephalic and atraumatic.     Nose: Congestion present.  Eyes:     Conjunctiva/sclera: Conjunctivae normal.  Cardiovascular:     Rate and Rhythm: Normal rate and regular rhythm.     Heart sounds: No murmur heard. Pulmonary:     Effort: Pulmonary effort is normal. No respiratory distress.     Breath sounds: Examination of the right-upper  field reveals decreased breath sounds and wheezing. Examination of the left-upper field reveals decreased breath sounds and wheezing. Decreased breath sounds and wheezing present.  Abdominal:     Palpations: Abdomen is soft.     Tenderness: There is no  abdominal tenderness.  Musculoskeletal:        General: No swelling.     Cervical back: Neck supple.  Skin:    General: Skin is warm and dry.     Capillary Refill: Capillary refill takes less than 2 seconds.  Neurological:     Mental Status: She is alert. Mental status is at baseline.  Psychiatric:        Mood and Affect: Mood normal.     UC Treatments / Results  Labs (all labs ordered are listed, but only abnormal results are displayed) Labs Reviewed - No data to display  EKG   Radiology No results found.  Procedures Procedures (including critical care time)  Medications Ordered in UC Medications  ipratropium-albuterol (DUONEB) 0.5-2.5 (3) MG/3ML nebulizer solution 3 mL (has no administration in time range)    Initial Impression / Assessment and Plan / UC Course  I have reviewed the triage vital signs and the nursing notes.  Pertinent labs & imaging results that were available during my care of the patient were reviewed by me and considered in my medical decision making (see chart for details).     Acute cough  Congestion of nasal sinus  Low oxygen saturation   Initially we felt the patient would be amendable to outpatient treatment for possible pneumonia however after the main caregiver was contacted new information was given to [place] that the patient has not been able to eat or drink much for 7 days.  The caregiver reported she was only taking in very small amounts of water.  Given this we feel that is more advantageous for her to be evaluated at the emergency room especially given that the patient is unable to give a reliable history to [place].  This is explained to the caregiver with the patient here as well who is the  main caregiver at home.  The patient will be transported to the emergency room by the family member. Final Clinical Impressions(s) / UC Diagnoses   Final diagnoses:  None   Discharge Instructions   None    ED Prescriptions   None    PDMP not reviewed this encounter.   Landis Martins, [place] 07/15/23 1708

## 2023-07-15 NOTE — H&P (Addendum)
Hospital Admission History and Physical Service Pager: [phone]  Patient name: Patricia White Medical record number: [id number] Date of Birth: 03-Nov-1970 Age: 52 y.o. Gender: female  Primary Care Provider: Celine Mans, MD Consultants: none Code Status: DNR, confirmed by family Preferred Emergency Contact:  Contact Information     Name Relation Home Work Mobile   Mckey,[name] Legal Guardian [phone]  [phone]   Cuca, [place] [phone]  [phone]      Other Contacts   None on File      Chief Complaint: cough  Assessment and Plan: Patricia White is a 52 y.o. female presenting with persistent cough and decreased oral intake, now with AKI and suspected UTI, and hypokalemia . Differential for this patient's presentation includes poor PO intake secondary to cough/URI symptoms, pyelonephritis, obstruction 2/2 kidney stones, interstitial cystitis. Poor PO intake is most likely cause of AKI as patient has one week history of decrease intake and only drinking a small amount of water. AKI likely not helped by concurrent UTI, and cannot rule out ascending infection at this time, although physical exam was not convincing for pyelo. Further, anti-hypertensives in the setting of decrease intake may exacerbate kidney injury. Interstitial cystitis and kidney stones least likely as UA supports UTI.  Differential for persistent cough includes viral URI, GERD, pneumonia, and PE. URI and GERD are most likely, given non-productive cough and mild course, although cannot rule out PE with concurrent tachycardia. Suspect borderline tachycardia will improve with rehydration efforts and reassess need for imaging after fluid support. Pneumonia least likely given negative CXR.  Patient will be admitted to [facility] on med-surg for observation.   Assessment & Plan AKI (acute kidney injury) (HCC) Cr 1.74, with baseline ~1.0. Likely secondary to poor p.o. intake over the last 5 days and  additionally receiving blood pressure medications in the setting of this.  -1 L bolus lactated Ringer's x 2 -Repeat BMP in a.m. -Encourage p.o. intake -Hold home antihypertensive agents Acute cystitis without hematuria UA with large leukocytes and WBC >50 although no correlated symptoms. S/p CTX IV 1g in the ED. -Urine culture pending -Reevaluate antibiotics in a.m. -Renal ultrasound to evaluate for pyelonephritis -Monitor I's and O's Hypokalemia K 2.7, awaiting recheck. Patient has a history of hypokalemia, but is on several medications at home which should be causing her to hold onto potassium. -follow up Mg, repeat CMP -10 mEq IV potassium x 4 -Oral potassium 40 mEq once -Repeat BMP in a.m. Acute cough Patient is experiencing posttussive vomiting/discomfort.  Given uncertain cause will do multifactorial treatment.  -Mucinex DM twice daily -Pepcid 20 mg twice daily -f/u respiratory viral panel -Zofran 4 mg for nausea  Chronic and Stable Conditions: HLD: Continue home atorvastatin 40 mg Cerebral Palsy/ Seizure disorder: Continue home Tegretol 200 mg twice daily, Depakote 500 mg twice daily, Topamax 75 mg twice daily. HTN-hold home antihypertensives (Losartan 100mg  daily, Indapamide 1.25mg  daily) in setting of low BP and AKI  FEN/GI: Regular VTE Prophylaxis: Lovenox  Disposition: MedSurg  History of Present Illness:  Patricia White is a 52 y.o. female presenting with one week of URI sx. Seen in urgent care today, presumed to have pneumonia, transferred to ED after primary caregiver (grandmother) provided info of poor PO intake.   [name] (aunt) acts as historian. States coughing started 5 days ago. They took her to the urgent care today and urgent care told her to proceed to ED because she had pneumonia. Tonight in the ED, she has been  coughing so much that she has vomited. She has had a runny nose, denies fever. She does have sore throat. She has not been eating well (and she  likes to eat). The decreased intake started after she began coughing. She has been able to eat [place] and drink some water and minimal soda. Endorses epigastric pain and some difficulty breathing with nausea. She has been urinating/stooling normally, no diarrhea.   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  In ED, CXR read as normal, CBC and CMP show hypokalemia, AKI, and mild anemia. UA shows leukocytosis, urine cultures ordered. Kidney US ordered due to issues with history taking given pt's intellectual disability  Review Of Systems: Per HPI  Pertinent Past Medical History: Cerebral palsy Seizure disorder Hypertension Mild ID Hypokalemia Remainder reviewed in history tab.   Pertinent Past Surgical History: None Remainder reviewed in history tab.   Pertinent Social History: Tobacco use: None Alcohol use: None Other Substance use: None Lives with grandmother and aunt  Pertinent Family History: Mother deceased Aug 03, 2011, liver cancer Maternal grandfather history of colon, prostate, throat cancer  Remainder reviewed in history tab.   Important Outpatient Medications: Atorvastatin 40 mg Tegretol 300 mg twice daily Depakote 250 mg 2 tablets twice a day Indapamide 1.25 mg daily  Xyzal 5 mg twice daily Cozaar 100 mg at bedtime Xolair 300 mg monthly Topamax 75 mg twice daily Remainder reviewed in medication history.   Objective: BP 92/73   Pulse 100   Temp 97.8 F (36.6 C)   Resp (!) 24   Ht 5\' 3"  (1.6 m)   Wt 79.2 kg   SpO2 100%   BMI 30.93 kg/m  Exam: General: Alert, no apparent distress Eyes: PERRLA EOMI ENTM: Ceruminous bilateral EACs.  Mucous membranes moist and nonerythematous.  Oropharynx mildly erythematous, no appreciable tonsillar swelling or exudate. Neck: No apparent cervical lymphadenopathy.  Trachea midline. Cardiovascular: Tachycardic, no M/R/G Respiratory: CTAB.  Nonproductive cough Gastrointestinal: Bowel sounds  present x 4.  No guarding or masses appreciated.  No CVA tenderness.  Patient reports diffuse TTP  Labs:  CBC BMET  Recent Labs  Lab 07/15/23 1812  WBC 7.0  HGB 11.1*  HCT 34.7*  PLT 284   Recent Labs  Lab 07/15/23 2010  NA 134*  K 2.7*  CL 96*  CO2 28  BUN 13  CREATININE 1.74*  GLUCOSE 118*  CALCIUM 8.9    COVID flu and RSV negative UA: large leuks, WBC >50, small blood, RBC 0-5  EKG: NSR, positive axis, no evidence of t-wave abnormalities nor U-waves  Imaging Studies Performed: CXR: no evidence of pneumonia  Gerrit Heck, DO 07/15/2023, 10:37 PM PGY-1, [facility]  FPTS Intern pager: [phone], text pages welcome Secure chat group [facility] Teaching Service   I was personally present and performed or re-performed the history, physical exam and medical decision making activities of this service and have verified that the service and findings are accurately documented in the student's note.  Shelby Mattocks, DO                  07/15/2023, 10:55 PM

## 2023-07-16 ENCOUNTER — Encounter (HOSPITAL_COMMUNITY): Payer: Self-pay | Admitting: Family Medicine

## 2023-07-16 DIAGNOSIS — J45909 Unspecified asthma, uncomplicated: Secondary | ICD-10-CM | POA: Diagnosis present

## 2023-07-16 DIAGNOSIS — Z79899 Other long term (current) drug therapy: Secondary | ICD-10-CM | POA: Diagnosis not present

## 2023-07-16 DIAGNOSIS — G809 Cerebral palsy, unspecified: Secondary | ICD-10-CM | POA: Diagnosis present

## 2023-07-16 DIAGNOSIS — B349 Viral infection, unspecified: Secondary | ICD-10-CM | POA: Diagnosis present

## 2023-07-16 DIAGNOSIS — G40909 Epilepsy, unspecified, not intractable, without status epilepticus: Secondary | ICD-10-CM | POA: Diagnosis present

## 2023-07-16 DIAGNOSIS — N3000 Acute cystitis without hematuria: Secondary | ICD-10-CM | POA: Insufficient documentation

## 2023-07-16 DIAGNOSIS — F79 Unspecified intellectual disabilities: Secondary | ICD-10-CM | POA: Diagnosis present

## 2023-07-16 DIAGNOSIS — R059 Cough, unspecified: Secondary | ICD-10-CM | POA: Diagnosis present

## 2023-07-16 DIAGNOSIS — Z66 Do not resuscitate: Secondary | ICD-10-CM | POA: Diagnosis present

## 2023-07-16 DIAGNOSIS — E119 Type 2 diabetes mellitus without complications: Secondary | ICD-10-CM | POA: Diagnosis present

## 2023-07-16 DIAGNOSIS — N179 Acute kidney failure, unspecified: Secondary | ICD-10-CM

## 2023-07-16 DIAGNOSIS — E785 Hyperlipidemia, unspecified: Secondary | ICD-10-CM | POA: Diagnosis present

## 2023-07-16 DIAGNOSIS — E669 Obesity, unspecified: Secondary | ICD-10-CM | POA: Diagnosis present

## 2023-07-16 DIAGNOSIS — I10 Essential (primary) hypertension: Secondary | ICD-10-CM | POA: Diagnosis present

## 2023-07-16 DIAGNOSIS — N200 Calculus of kidney: Secondary | ICD-10-CM | POA: Diagnosis present

## 2023-07-16 DIAGNOSIS — Z6830 Body mass index (BMI) 30.0-30.9, adult: Secondary | ICD-10-CM | POA: Diagnosis not present

## 2023-07-16 DIAGNOSIS — D649 Anemia, unspecified: Secondary | ICD-10-CM | POA: Diagnosis present

## 2023-07-16 DIAGNOSIS — E876 Hypokalemia: Secondary | ICD-10-CM | POA: Diagnosis present

## 2023-07-16 DIAGNOSIS — R8271 Bacteriuria: Secondary | ICD-10-CM | POA: Diagnosis present

## 2023-07-16 DIAGNOSIS — Z1152 Encounter for screening for COVID-19: Secondary | ICD-10-CM | POA: Diagnosis not present

## 2023-07-16 LAB — RESPIRATORY PANEL BY PCR

## 2023-07-16 LAB — COMPREHENSIVE METABOLIC PANEL
ALT: 15 U/L (ref 0–44)
AST: 68 U/L — ABNORMAL HIGH (ref 15–41)
Albumin: 2.7 g/dL — ABNORMAL LOW (ref 3.5–5.0)
Alkaline Phosphatase: 81 U/L (ref 38–126)
Anion gap: 9 (ref 5–15)
BUN: 13 mg/dL (ref 6–20)
CO2: 28 mmol/L (ref 22–32)
Calcium: 8.6 mg/dL — ABNORMAL LOW (ref 8.9–10.3)
Chloride: 96 mmol/L — ABNORMAL LOW (ref 98–111)
Creatinine, Ser: 1.65 mg/dL — ABNORMAL HIGH (ref 0.44–1.00)
GFR, Estimated: 38 mL/min — ABNORMAL LOW (ref >60)
Glucose, Bld: 113 mg/dL — ABNORMAL HIGH (ref 70–99)
Potassium: 2.9 mmol/L — ABNORMAL LOW (ref 3.5–5.1)
Sodium: 133 mmol/L — ABNORMAL LOW (ref 135–145)
Total Bilirubin: 1.2 mg/dL — ABNORMAL HIGH (ref <1.2)
Total Protein: 8.8 g/dL — ABNORMAL HIGH (ref 6.5–8.1)

## 2023-07-16 LAB — BASIC METABOLIC PANEL
Anion gap: 9 (ref 5–15)
Anion gap: 7 (ref 5–15)
BUN: 11 mg/dL (ref 6–20)
BUN: 8 mg/dL (ref 6–20)
CO2: 21 mmol/L — ABNORMAL LOW (ref 22–32)
CO2: 25 mmol/L (ref 22–32)
Calcium: 7.9 mg/dL — ABNORMAL LOW (ref 8.9–10.3)
Calcium: 8.4 mg/dL — ABNORMAL LOW (ref 8.9–10.3)
Chloride: 107 mmol/L (ref 98–111)
Chloride: 106 mmol/L (ref 98–111)
Creatinine, Ser: 1.42 mg/dL — ABNORMAL HIGH (ref 0.44–1.00)
Creatinine, Ser: 1.19 mg/dL — ABNORMAL HIGH (ref 0.44–1.00)
GFR, Estimated: 45 mL/min — ABNORMAL LOW (ref >60)
GFR, Estimated: 56 mL/min — ABNORMAL LOW (ref >60)
Glucose, Bld: 103 mg/dL — ABNORMAL HIGH (ref 70–99)
Glucose, Bld: 130 mg/dL — ABNORMAL HIGH (ref 70–99)
Potassium: 3.3 mmol/L — ABNORMAL LOW (ref 3.5–5.1)
Potassium: 3.3 mmol/L — ABNORMAL LOW (ref 3.5–5.1)
Sodium: 137 mmol/L (ref 135–145)
Sodium: 138 mmol/L (ref 135–145)

## 2023-07-16 LAB — URINE CULTURE

## 2023-07-16 LAB — HIV ANTIBODY (ROUTINE TESTING W REFLEX): HIV Screen 4th Generation wRfx: NONREACTIVE

## 2023-07-16 LAB — MAGNESIUM: Magnesium: 2.3 mg/dL (ref 1.7–2.4)

## 2023-07-16 MED ORDER — POTASSIUM CHLORIDE CRYS ER 20 MEQ PO TBCR
40.0000 meq | EXTENDED_RELEASE_TABLET | Freq: Two times a day (BID) | ORAL | Status: AC
  Administered 2023-07-16 – 2023-07-17 (×2): 40 meq via ORAL
  Filled 2023-07-16 (×2): qty 2

## 2023-07-16 MED ORDER — ACETAMINOPHEN 325 MG PO TABS
650.0000 mg | ORAL_TABLET | Freq: Four times a day (QID) | ORAL | Status: DC
  Administered 2023-07-16 – 2023-07-17 (×4): 650 mg via ORAL
  Filled 2023-07-16 (×4): qty 2

## 2023-07-16 MED ORDER — ALBUTEROL SULFATE (2.5 MG/3ML) 0.083% IN NEBU: 2.5000 mg | INHALATION_SOLUTION | RESPIRATORY_TRACT | Status: DC | PRN

## 2023-07-16 MED ORDER — SODIUM CHLORIDE 0.9 % IV BOLUS
2000.0000 mL | Freq: Once | INTRAVENOUS | Status: AC
  Administered 2023-07-16: 2000 mL via INTRAVENOUS

## 2023-07-16 MED ORDER — GUAIFENESIN ER 600 MG PO TB12
600.0000 mg | ORAL_TABLET | Freq: Two times a day (BID) | ORAL | Status: DC
  Administered 2023-07-16 – 2023-07-17 (×3): 600 mg via ORAL
  Filled 2023-07-16 (×3): qty 1

## 2023-07-16 MED ORDER — POTASSIUM CHLORIDE 10 MEQ/100ML IV SOLN
10.0000 meq | INTRAVENOUS | Status: AC
  Administered 2023-07-16 (×2): 10 meq via INTRAVENOUS
  Filled 2023-07-16: qty 100

## 2023-07-16 MED ORDER — PHENOL 1.4 % MT LIQD
1.0000 | OROMUCOSAL | Status: DC | PRN
  Administered 2023-07-16: 1 via OROMUCOSAL
  Filled 2023-07-16: qty 177

## 2023-07-16 MED ORDER — SODIUM CHLORIDE 0.9 % IV SOLN
1.0000 g | INTRAVENOUS | Status: DC
  Administered 2023-07-16: 1 g via INTRAVENOUS
  Filled 2023-07-16: qty 10

## 2023-07-16 NOTE — Assessment & Plan Note (Signed)
Cr 1.74, with baseline ~1.0. Likely secondary to poor p.o. intake over the last 5 days and additionally receiving blood pressure medications in the setting of this.  -1 L bolus lactated Ringer's x 2 -Repeat BMP in a.m. -Encourage p.o. intake -Hold home antihypertensive agents

## 2023-07-16 NOTE — Progress Notes (Addendum)
Daily Progress Note Intern Pager: [phone]  Patient name: Patricia White Medical record number: [id number] Date of birth: [birth year] Age: 52 y.o. Gender: female  Primary Care Provider: Celine Mans, MD Consultants: None Code Status: DNR/DNI  Pt Overview and Major Events to Date:  12/1 - Admitted 12/2 - CTX continued  Assessment and Plan: Patricia White is a 52 y.o. female with a pertinent PMH of cerebral palsy, seizure disorder, HTN, mild ID, and hypokalemia who presented with cough and decreased p.o. intake and was admitted for AKI and hypokalemia, currently undergoing UTI and viral respiratory workup.  Given the patient is now complaining of dysuria and her leukocytes in UA, suspect UTI is main driver of her presentation.  Will continue to treat with ceftriaxone.  Cough continues, and will follow-up respiratory viral panel, no concern for pneumonia at this time.  Patient is also having abdominal pain, suspect this is more likely related to UTI will obtain further imaging if pain worsens or changes.  Patient does struggle to express herself but is able to state basic needs when prompted.  Creatinine improving with hydration and will continue to encourage p.o. intake. Assessment & Plan AKI (acute kidney injury) (HCC) Cr 1.74>1.42 (baseline ~1.0). Likely secondary to poor PO intake and concurrent BP meds, was started on indapamide and not rechecked outpatient due to missed appointment. -1 L bolus lactated Ringer's x2 -Encourage PO intake, nursing care order to offer 250 mL Q4h, ice cream and pudding offered -Hold home antihypertensive agents -AM CMP (given slight bili elevation), trend creatinine Acute cystitis without hematuria UA with large leukocytes and WBC >50, now symptomatic this a.m.  Renal ultrasound unremarkable, lower concern for pyelonephritis.  Suspect output inaccurately documented. -Follow-up urine culture, NGTD -Monitor I&Os -Acetaminophen 650 mg Q6h scheduled  for abdominal pain and headache -Consider CT abdomen if abdominal pain changes/worsens -Continue CTX 1 g daily (day 2 of 3) Hypokalemia K 2.7>3.3, IV still running.  Patient has a history of hypokalemia, but is on several medications at home counteract. -10 mEq IV potassium x 4 -Follow-up PM BMP at 1600 -AM BMP, mag Acute cough Patient was experiencing posttussive vomiting/discomfort.  Given uncertain cause, will do multifactorial treatment. -Switch from Mucinex DM to robitussin BID, add chloraseptic throat spray PRN -Famotidine 20 mg BID -f/u respiratory viral panel -Zofran 4 mg for nausea  Chronic and Stable Problems: HLD: Continue home atorvastatin 40 mg Cerebral Palsy/ Seizure disorder: Continue home Tegretol 200 mg twice daily, Depakote 500 mg twice daily, Topamax 75 mg twice daily. HTN: Hold home antihypertensives (Losartan 100mg  daily, Indapamide 1.25mg  daily) in setting of low BP and AKI  FEN/GI: Regular PPx: Lovenox Dispo: Pending PT recommendations and pending clinical improvement .  Subjective:  This morning, patient is able to state that she is bothered by "cough, sore throat, stomach pain."  States stomach pain is in lower abdomen and sharp.  She also endorses pain with urination when prompted.  Continues to cough repeatedly during conversation.  Aunt states that patient will eat ice cream and pudding if given, otherwise p.o. intake is not great.  Objective: Temp:  [97.8 F (36.6 C)-98.5 F (36.9 C)] 98.5 F (36.9 C) (12/02 0239) Pulse Rate:  [87-104] 87 (12/02 0239) Resp:  [14-24] 17 (12/02 0239) BP: (90-114)/(55-78) 90/66 (12/02 0239) SpO2:  [92 %-100 %] 100 % (12/02 0239) Weight:  [79.2 kg] 79.2 kg (12/01 1807)  Physical Exam: General: Sitting up in bed, slowed but fluent speech and delayed cognition.  NAD, alert.Marland Kitchen HEENT: Mildly dry mucous membranes.  No scleral injections.  Posterior oropharynx erythema, no exudate or lesions. Cardiovascular: Regular rate and  rhythm. Normal S1/S2. No murmurs, rubs, or gallops appreciated. 2+ radial pulses. Pulmonary: Clear bilaterally to ascultation. No increased WOB, no accessory muscle usage on room air. No wheezes, rales, or crackles. Abdominal: Normoactive bowel sounds, abdomen nondistended.  Mild tenderness to palpation over lower abdomen. No rebound or guarding. Skin: Warm and dry. Extremities: No peripheral edema bilaterally.  Laboratory: Most recent CBC Lab Results  Component Value Date   WBC 7.0 07/15/2023   HGB 11.1 (L) 07/15/2023   HCT 34.7 (L) 07/15/2023   MCV 92.5 07/15/2023   PLT 284 07/15/2023   Most recent BMP    Latest Ref Rng & Units 07/16/2023    4:21 AM  BMP  Glucose 70 - 99 mg/dL 161   BUN 6 - 20 mg/dL 11   Creatinine 0.96 - 1.00 mg/dL 0.45   Sodium 409 - 811 mmol/L 137   Potassium 3.5 - 5.1 mmol/L 3.3   Chloride 98 - 111 mmol/L 107   CO2 22 - 32 mmol/L 21   Calcium 8.9 - 10.3 mg/dL 7.9     Other pertinent labs: -None  New Imaging/Diagnostic Tests: -Renal US: Unremarkable -CXR: Unremarkable  [place], MD 07/16/2023, 7:20 AM  PGY-1, [facility] FPTS Intern pager: [phone], text pages welcome Secure chat group [facility] Teaching Service

## 2023-07-16 NOTE — Assessment & Plan Note (Addendum)
Cr 1.74>1.42 (baseline ~1.0). Likely secondary to poor PO intake and concurrent BP meds, was started on indapamide and not rechecked outpatient due to missed appointment. -1 L bolus lactated Ringer's x2 -Encourage PO intake, nursing care order to offer 250 mL Q4h, ice cream and pudding offered -Hold home antihypertensive agents -AM CMP (given slight bili elevation), trend creatinine

## 2023-07-16 NOTE — Assessment & Plan Note (Signed)
UA with large leukocytes and WBC >50 although no correlated symptoms. S/p CTX IV 1g in the ED. -Urine culture pending -Reevaluate antibiotics in a.m. -Renal ultrasound to evaluate for pyelonephritis -Monitor I's and O's

## 2023-07-16 NOTE — Assessment & Plan Note (Addendum)
K 2.7>3.3, IV still running.  Patient has a history of hypokalemia, but is on several medications at home counteract. -10 mEq IV potassium x 4 -Follow-up PM BMP at 1600 -AM BMP, mag

## 2023-07-16 NOTE — Assessment & Plan Note (Addendum)
UA with large leukocytes and WBC >50, now symptomatic this a.m.  Renal ultrasound unremarkable, lower concern for pyelonephritis.  Suspect output inaccurately documented. -Follow-up urine culture, NGTD -Monitor I&Os -Acetaminophen 650 mg Q6h scheduled for abdominal pain and headache -Consider CT abdomen if abdominal pain changes/worsens -Continue CTX 1 g daily (day 2 of 3)

## 2023-07-16 NOTE — Plan of Care (Signed)

## 2023-07-16 NOTE — Assessment & Plan Note (Addendum)
Patient was experiencing posttussive vomiting/discomfort.  Given uncertain cause, will do multifactorial treatment. -Switch from Mucinex DM to robitussin BID, add chloraseptic throat spray PRN -Famotidine 20 mg BID -f/u respiratory viral panel -Zofran 4 mg for nausea

## 2023-07-16 NOTE — Assessment & Plan Note (Addendum)
K 2.7, awaiting recheck. Patient has a history of hypokalemia, but is on several medications at home which should be causing her to hold onto potassium. -follow up Mg, repeat CMP -10 mEq IV potassium x 4 -Oral potassium 40 mEq once -Repeat BMP in a.m.

## 2023-07-16 NOTE — Assessment & Plan Note (Signed)
Patient is experiencing posttussive vomiting/discomfort.  Given uncertain cause will do multifactorial treatment.  -Mucinex DM twice daily -Pepcid 20 mg twice daily -f/u respiratory viral panel -Zofran 4 mg for nausea

## 2023-07-16 NOTE — ED Notes (Signed)
..ED TO INPATIENT HANDOFF REPORT  ED Nurse Name and Phone #: [phone]  S Name/Age/Gender [name] 52 y.o. female Room/Bed: 001C/001C  Code Status   Code Status: Limited: Do not attempt resuscitation (DNR) -DNR-LIMITED -Do Not Intubate/DNI   Home/SNF/Other Home Patient oriented to: self, place, time, and situation Is this baseline? Yes   Triage Complete: Triage complete  Chief Complaint AKI (acute kidney injury) (HCC) [N17.9]  Triage Note The pt was sent here from [facility] they diagnosed her with pneumonia by chest xray  she has been ill for one week  unknown temp  coughing in triage  ucc gave her a hhn there also     Allergies No Known Allergies  Level of Care/Admitting Diagnosis ED Disposition     ED Disposition  Admit   Condition  --   Comment  Hospital Area: [facility] [[id number]]  Level of Care: Med-Surg [16]  [facility] place patient in observation at [facility] or [facility] if equivalent level of care is available:: No  Covid Evaluation: Confirmed COVID Negative  Diagnosis: AKI (acute kidney injury) [facility]) [[id number]]  Admitting Physician: Gerrit Heck [[id number]]  Attending Physician: Westley Chandler [[id number]]          B Medical/Surgery History Past Medical History:  Diagnosis Date   Asthma    Cerebral palsy (HCC) Birth   Mental retardation Birth   Seizures (HCC)    History reviewed. No pertinent surgical history.   A IV Location/Drains/Wounds Patient Lines/Drains/Airways Status     Active Line/Drains/Airways     Name Placement date Placement time Site Days   Peripheral IV 07/16/23 20 G 1" Anterior;Proximal;Right Forearm 07/16/23  0008  Forearm  less than 1            Intake/Output Last 24 hours No intake or output data in the 24 hours ending 07/16/23 0109  Labs/Imaging Results for orders placed or performed during the hospital encounter of 07/15/23 (from the past 48 hour(s))  CBC with Differential     Status: Abnormal    Collection Time: 07/15/23  6:12 PM  Result Value Ref Range   WBC 7.0 4.0 - 10.5 K/uL   RBC 3.75 (L) 3.87 - 5.11 MIL/uL   Hemoglobin 11.1 (L) 12.0 - 15.0 g/dL   HCT 18.8 (L) 41.6 - 60.6 %   MCV 92.5 80.0 - 100.0 fL   MCH 29.6 26.0 - 34.0 pg   MCHC 32.0 30.0 - 36.0 g/dL   RDW 30.1 60.1 - 09.3 %   Platelets 284 150 - 400 K/uL   nRBC 0.0 0.0 - 0.2 %   Neutrophils Relative % 61 %   Neutro Abs 4.3 1.7 - 7.7 K/uL   Lymphocytes Relative 25 %   Lymphs Abs 1.7 0.7 - 4.0 K/uL   Monocytes Relative 9 %   Monocytes Absolute 0.6 0.1 - 1.0 K/uL   Eosinophils Relative 5 %   Eosinophils Absolute 0.4 0.0 - 0.5 K/uL   Basophils Relative 0 %   Basophils Absolute 0.0 0.0 - 0.1 K/uL   Immature Granulocytes 0 %   Abs Immature Granulocytes 0.01 0.00 - 0.07 K/uL    Comment: Performed at [facility], [address] [place], [place] [address]  Resp panel by RT-PCR (RSV, Flu A&B, Covid) Anterior Nasal Swab     Status: None   Collection Time: 07/15/23  6:18 PM   Specimen: Anterior Nasal Swab  Result Value Ref Range   SARS Coronavirus 2 by  RT PCR NEGATIVE NEGATIVE   Influenza A by PCR NEGATIVE NEGATIVE   Influenza B by PCR NEGATIVE NEGATIVE    Comment: (NOTE) The Xpert Xpress SARS-CoV-2/FLU/RSV plus assay is intended as an aid in the diagnosis of influenza from Nasopharyngeal swab specimens and should not be used as a sole basis for treatment. Nasal washings and aspirates are unacceptable for Xpert Xpress SARS-CoV-2/FLU/RSV testing.  Fact Sheet for Patients: BloggerCourse.com  Fact Sheet for Healthcare Providers: SeriousBroker.it  This test is not yet approved or cleared by the [place] FDA and has been authorized for detection and/or diagnosis of SARS-CoV-2 by FDA under an Emergency Use Authorization (EUA). This EUA will remain in effect (meaning this test can be used) for the duration of the COVID-19 declaration under Section  564(b)(1) of the Act, 21 U.S.C. section 360bbb-3(b)(1), unless the authorization is terminated or revoked.     Resp Syncytial Virus by PCR NEGATIVE NEGATIVE    Comment: (NOTE) Fact Sheet for Patients: BloggerCourse.com  Fact Sheet for Healthcare Providers: SeriousBroker.it  This test is not yet approved or cleared by the [place] FDA and has been authorized for detection and/or diagnosis of SARS-CoV-2 by FDA under an Emergency Use Authorization (EUA). This EUA will remain in effect (meaning this test can be used) for the duration of the COVID-19 declaration under Section 564(b)(1) of the Act, 21 U.S.C. section 360bbb-3(b)(1), unless the authorization is terminated or revoked.  Performed at [facility], [address] [place], [place] [address]   Urinalysis, Routine w reflex microscopic -Urine, Clean Catch     Status: Abnormal   Collection Time: 07/15/23  6:48 PM  Result Value Ref Range   Color, Urine YELLOW YELLOW   APPearance HAZY (A) CLEAR   Specific Gravity, Urine 1.011 1.005 - 1.030   pH 5.0 5.0 - 8.0   Glucose, UA NEGATIVE NEGATIVE mg/dL   Hgb urine dipstick SMALL (A) NEGATIVE   Bilirubin Urine NEGATIVE NEGATIVE   Ketones, ur NEGATIVE NEGATIVE mg/dL   Protein, ur NEGATIVE NEGATIVE mg/dL   Nitrite NEGATIVE NEGATIVE   Leukocytes,Ua LARGE (A) NEGATIVE   RBC / HPF 0-5 0 - 5 RBC/hpf   WBC, UA >50 0 - 5 WBC/hpf   Bacteria, UA RARE (A) NONE SEEN   Squamous Epithelial / HPF 0-5 0 - 5 /HPF   Mucus PRESENT     Comment: Performed at [facility], [address], [place], [place] [address]  Comprehensive metabolic panel     Status: Abnormal   Collection Time: 07/15/23  8:10 PM  Result Value Ref Range   Sodium 134 (L) 135 - 145 mmol/L   Potassium 2.7 (LL) 3.5 - 5.1 mmol/L    Comment: CRITICAL RESULT CALLED TO, READ BACK BY AND VERIFIED WITH BERRIER C, RN 2210 07/15/2023 SANDOVAL K   Chloride 96 (L) 98 - 111  mmol/L   CO2 28 22 - 32 mmol/L   Glucose, Bld 118 (H) 70 - 99 mg/dL    Comment: Glucose reference range applies only to samples taken after fasting for at least 8 hours.   BUN 13 6 - 20 mg/dL   Creatinine, Ser 9.32 (H) 0.44 - 1.00 mg/dL   Calcium 8.9 8.9 - 35.5 mg/dL   Total Protein 9.1 (H) 6.5 - 8.1 g/dL   Albumin 2.7 (L) 3.5 - 5.0 g/dL   AST 64 (H) 15 - 41 U/L   ALT 15 0 - 44 U/L   Alkaline Phosphatase 79 38 -  126 U/L   Total Bilirubin 0.6 <1.2 mg/dL   GFR, Estimated 35 (L) >60 mL/min    Comment: (NOTE) Calculated using the CKD-EPI Creatinine Equation (2021)    Anion gap 10 5 - 15    Comment: Performed at [facility], [address], [place], [place] [address]   US Renal  Result Date: 07/16/2023 CLINICAL DATA:  Acute kidney injury. EXAM: RENAL / URINARY TRACT ULTRASOUND COMPLETE COMPARISON:  None Available. FINDINGS: Right Kidney: Renal measurements: 9.7 cm x 3.8 cm x 3.9 cm = volume: 75.00 mL. Echogenicity within normal limits. No mass or hydronephrosis visualized. Left Kidney: Renal measurements: 10.4 cm x 4.6 cm x 4.6 cm = volume: 116.67 mL. Echogenicity within normal limits. No mass or hydronephrosis visualized. Bladder: The urinary bladder is empty and subsequently limited in evaluation. Other: Limited study secondary to limited patient positioning. IMPRESSION: Unremarkable renal ultrasound. Electronically Signed   By: Aram Candela M.D.   On: 07/16/2023 00:27   DG Chest 1 View  Result Date: 07/15/2023 CLINICAL DATA:  Cough and chest congestion for 1 week. EXAM: CHEST  1 VIEW COMPARISON:  09/07/2022 FINDINGS: The heart size and mediastinal contours are within normal limits. Stable mild elevation of left hemidiaphragm. Both lungs are clear. The visualized skeletal structures are unremarkable. IMPRESSION: No active disease. Electronically Signed   By: Danae Orleans M.D.   On: 07/15/2023 17:05    Pending Labs Unresulted Labs (From admission, onward)     Start     Ordered    07/16/23 0500  Basic metabolic panel  Tomorrow morning,   R        07/15/23 2334   07/15/23 2328  Respiratory (~20 pathogens) panel by PCR  (Respiratory panel by PCR (~20 pathogens, ~24 hr TAT)  w precautions)  Once,   R        07/15/23 2334   07/15/23 2321  HIV Antibody (routine testing w rflx)  (HIV Antibody (Routine testing w reflex) panel)  Once,   R        07/15/23 2334   07/15/23 2217  Magnesium  Once,   STAT        07/15/23 2217   07/15/23 2202  Comprehensive metabolic panel  Once,   STAT        07/15/23 2202   07/15/23 1956  Urine Culture  Add-on,   AD       Question:  Indication  Answer:  Dysuria   07/15/23 1955   07/15/23 1932  Lactic acid, plasma  (Lactic Acid)  Now then every 2 hours,   R (with STAT occurrences)      07/15/23 1931   07/15/23 1812  Blood culture (routine x 2)  BLOOD CULTURE X 2,   R (with STAT occurrences)      07/15/23 1813            Vitals/Pain Today's Vitals   07/15/23 2115 07/15/23 2130 07/15/23 2215 07/16/23 0100  BP: 98/71 96/72 92/73  93/72  Pulse:   100 93  Resp: (!) 24 (!) 21 (!) 24 19  Temp:    98.2 F (36.8 C)  SpO2:   100% 99%  Weight:      Height:      PainSc:        Isolation Precautions No active isolations  Medications Medications  albuterol (VENTOLIN HFA) 108 (90 Base) MCG/ACT inhaler 2 puff (has no administration in time range)  atorvastatin (LIPITOR) tablet 40 mg (has no administration  in time range)  divalproex (DEPAKOTE ER) 24 hr tablet 500 mg (has no administration in time range)  topiramate (TOPAMAX) tablet 75 mg (has no administration in time range)  enoxaparin (LOVENOX) injection 40 mg (has no administration in time range)  dextromethorphan-guaiFENesin (MUCINEX DM) 30-600 MG per 12 hr tablet 1 tablet (has no administration in time range)  famotidine (PEPCID) tablet 20 mg (20 mg Oral Given 07/16/23 0012)  potassium chloride 10 mEq in 100 mL IVPB (10 mEq Intravenous New Bag/Given 07/16/23 0103)  cetirizine (ZYRTEC)  tablet 10 mg (has no administration in time range)  carbamazepine (TEGRETOL) tablet 300 mg (has no administration in time range)  benzonatate (TESSALON) capsule 100 mg (100 mg Oral Given 07/15/23 2049)  cefTRIAXone (ROCEPHIN) 1 g in sodium chloride 0.9 % 100 mL IVPB (0 g Intravenous Stopped 07/16/23 0038)  ondansetron (ZOFRAN) tablet 4 mg (4 mg Oral Given 07/16/23 0012)  potassium chloride SA (KLOR-CON M) CR tablet 40 mEq (40 mEq Oral Given 07/16/23 0011)  sodium chloride 0.9 % bolus 2,000 mL (2,000 mLs Intravenous New Bag/Given 07/16/23 0102)    Mobility walks with device     Focused Assessments Pulmonary Assessment Handoff:  Lung sounds:          R Recommendations: See Admitting Provider Note  Report given to:   Additional Notes: Pt being admitted for pneumonia, alert and oriented, able to walk to and from bathroom. Nsr on monitor. Has her first fluids running now.

## 2023-07-17 ENCOUNTER — Other Ambulatory Visit (HOSPITAL_COMMUNITY): Payer: Self-pay

## 2023-07-17 DIAGNOSIS — E876 Hypokalemia: Secondary | ICD-10-CM | POA: Diagnosis not present

## 2023-07-17 DIAGNOSIS — N179 Acute kidney failure, unspecified: Secondary | ICD-10-CM | POA: Diagnosis not present

## 2023-07-17 LAB — BASIC METABOLIC PANEL
Anion gap: 10 (ref 5–15)
BUN: 6 mg/dL (ref 6–20)
CO2: 24 mmol/L (ref 22–32)
Calcium: 8.2 mg/dL — ABNORMAL LOW (ref 8.9–10.3)
Chloride: 101 mmol/L (ref 98–111)
Creatinine, Ser: 1.2 mg/dL — ABNORMAL HIGH (ref 0.44–1.00)
GFR, Estimated: 55 mL/min — ABNORMAL LOW (ref >60)
Glucose, Bld: 88 mg/dL (ref 70–99)
Potassium: 3.9 mmol/L (ref 3.5–5.1)
Sodium: 135 mmol/L (ref 135–145)

## 2023-07-17 LAB — HEPATIC FUNCTION PANEL
ALT: 13 U/L (ref 0–44)
AST: 44 U/L — ABNORMAL HIGH (ref 15–41)
Albumin: 2.2 g/dL — ABNORMAL LOW (ref 3.5–5.0)
Alkaline Phosphatase: 62 U/L (ref 38–126)
Bilirubin, Direct: <0.1 mg/dL (ref 0.0–0.2)
Total Bilirubin: 0.4 mg/dL (ref <1.2)
Total Protein: 7.4 g/dL (ref 6.5–8.1)

## 2023-07-17 LAB — MAGNESIUM: Magnesium: 1.8 mg/dL (ref 1.7–2.4)

## 2023-07-17 MED ORDER — FAMOTIDINE 20 MG PO TABS
20.0000 mg | ORAL_TABLET | Freq: Two times a day (BID) | ORAL | 0 refills | Status: DC
  Filled 2023-07-17: qty 28, 14d supply, fill #0

## 2023-07-17 NOTE — Assessment & Plan Note (Deleted)
Patient was experiencing posttussive vomiting/discomfort.  Given uncertain cause, will do multifactorial treatment. -Switch from Mucinex DM to robitussin BID, add chloraseptic throat spray PRN -Famotidine 20 mg BID -f/u respiratory viral panel -Zofran 4 mg for nausea

## 2023-07-17 NOTE — Hospital Course (Addendum)
Patricia White is a 52 y.o.female with a history of cerebral palsy and intellectual disability, hypertension who was admitted to the family medicine teaching Service at [facility] for acute kidney injury in the setting of UTI. Her hospital course is detailed below:  Acute kidney injury Presenting with persistent cough and decreased oral intake Initial creatinine on admission was 1.74 with a baseline around 1.0.  Home antihypertensives were held.  Patient received two 1 L boluses of LR.  PO intake was monitored and encouraged with ice cream and pudding, as well as proactive offers of PO hydration.  Patient's creatinine returned to 1.2, which is near baseline by discharge.  Home blood pressure medicines were held at discharge pending PCP follow up.  Cough Suspect due to viral illness. RVP negative. CXR demonstrated no signs of pneumonia. Treated symptomatically while inpatient.  Hypokalemia Initial BMP showed potassium 2.7.  Initially received IV repletion was then transition to oral.  Potassium was trended until within normal limits at 3.9 day of discharge.  Tolerating PO intake.  Acute cystitis without hematuria UA obtained due to AKI above positive for large leukocytes and greater than 50 white blood cells.  Initially patient denied symptoms however did have some question of suprapubic pain.  She was started on ceftriaxone empirically and urine cultures were drawn, grew multiple species with none predominant.  Received 2 doses CTX total and not continued on antibiotics at discharge due to low suspicion for UTI.  Other chronic conditions were medically managed with home medications and formulary alternatives as necessary.  PCP Follow-up Recommendations: BMP in 1 week Restart BP meds as appropriate

## 2023-07-17 NOTE — Progress Notes (Signed)
07/17/2023  Patricia White DOB: 1971-04-01 MRN: [id number]   RIDER WAIVER AND RELEASE OF LIABILITY  For the purposes of helping with transportation needs, [facility] partners with outside transportation providers (taxi companies, [employer], Catering manager.) to give [name] patients or other approved people the choice of on-demand rides [employer]") to our buildings for non-emergency visits.  By using [employer], I, the person signing this document, on behalf of myself and/or any legal minors (in my care using the [employer]), agree:  Science writer given to me are supplied by independent, outside transportation providers who do not work for, or have any affiliation with, [name]. [name] is not a transportation company. [name] has no control over the quality or safety of the rides I get using [employer]. [facility] has no control over whether any outside ride will happen on time or not. [facility] gives no guarantee on the reliability, quality, safety, or availability on any rides, or that no mistakes will happen. I know and accept that traveling by vehicle (car, truck, SVU, Zenaida Niece, bus, taxi, etc.) has risks of serious injuries such as disability, being paralyzed, and death. I know and agree the risk of using [employer] is mine alone, and not [name]. Transport Services are provided "as is" and as are available. The transportation providers are in charge for all inspections and care of the vehicles used to provide these rides. I agree not to take legal action against [name], its agents, employees, officers, directors, representatives, insurers, attorneys, assigns, successors, subsidiaries, and affiliates at any time for any reasons related directly or indirectly to using [employer]. I also agree not to take legal action against [name] or its affiliates for any injury, death, or damage to property caused by or related to using  [employer]. I have read this Waiver and Release of Liability, and I understand the terms used in it and their legal meaning. This Waiver is freely and voluntarily given with the understanding that my right (or any legal minors) to legal action against [name] relating to [employer] is knowingly given up to use these services.   I attest that I read the Ride Waiver and Release of Liability to [name], pt's legal guardian and gave [name] the opportunity to ask questions and answered the questions asked (if any). I affirm that [name] then provided consent for assistance with transportation.  Verbal permission given.

## 2023-07-17 NOTE — Assessment & Plan Note (Deleted)
UA with large leukocytes and WBC >50, now symptomatic this a.m.  Renal ultrasound unremarkable, lower concern for pyelonephritis.  Suspect output inaccurately documented. -Follow-up urine culture, NGTD -Monitor I&Os -Acetaminophen 650 mg Q6h scheduled for abdominal pain and headache -Consider CT abdomen if abdominal pain changes/worsens -Continue CTX 1 g daily (day 2 of 3)

## 2023-07-17 NOTE — Discharge Summary (Addendum)
Family Medicine Teaching [facility] Discharge Summary  Patient name: Patricia White Medical record number: [id number] Date of birth: February 07, 1971 Age: 52 y.o. Gender: female Date of Admission: 07/15/2023  Date of Discharge: 07/17/2023 Admitting Physician: Gerrit Heck, DO  Primary Care Provider: Celine Mans, MD Consultants: None  Indication for Hospitalization: AKI  Discharge Diagnoses/Problem List:  Principal Problem for Admission: AKI Other Problems addressed during stay:  Principal Problem:   AKI (acute kidney injury) [facility]) Active Problems:   Acute cough   Hypokalemia   Acute cystitis without hematuria   Brief Hospital Course:  Patricia White is a 52 y.o.female with a history of cerebral palsy and intellectual disability, hypertension who was admitted to the family medicine teaching Service at [facility] for acute kidney injury in the setting of UTI. Her hospital course is detailed below:  Acute kidney injury Presenting with persistent cough and decreased oral intake Initial creatinine on admission was 1.74 with a baseline around 1.0.  Home antihypertensives were held.  Patient received two 1 L boluses of LR.  PO intake was monitored and encouraged with ice cream and pudding, as well as proactive offers of PO hydration.  Patient's creatinine returned to 1.2, which is near baseline by discharge.  Home blood pressure medicines were held at discharge pending PCP follow up.  Cough Suspect due to viral illness. RVP negative. CXR demonstrated no signs of pneumonia. Treated symptomatically while inpatient.  Hypokalemia Initial BMP showed potassium 2.7.  Initially received IV repletion was then transition to oral.  Potassium was trended until within normal limits at 3.9 day of discharge.  Tolerating PO intake.  Acute cystitis without hematuria UA obtained due to AKI above positive for large leukocytes and greater than 50 white blood cells.  Initially patient denied symptoms  however did have some question of suprapubic pain.  She was started on ceftriaxone empirically and urine cultures were drawn, grew multiple species with none predominant.  Received 2 doses CTX total and not continued on antibiotics at discharge due to low suspicion for UTI.  Other chronic conditions were medically managed with home medications and formulary alternatives as necessary.  PCP Follow-up Recommendations: BMP in 1 week Restart BP meds as appropriate  Disposition: Home  Discharge Condition: Stable, creatinine recovering  Discharge Exam:  Vitals:   07/17/23 0406 07/17/23 0747  BP: 97/70 99/62  Pulse: 96 100  Resp: 16 16  Temp: 98.2 F (36.8 C) 97.9 F (36.6 C)  SpO2: 98% 97%   Physical Exam: General: Adult female, resting in bed, developmentally delayed. Eyes: No scleral icterus. ENTM: MMM. Cardiovascular: Normal S1-S2.  RRR.  No murmurs/rubs gallops. Respiratory: Normal work of breathing on room air.  CTAB. Gastrointestinal: No tenderness to palpation, including suprapubic. MSK: Moving all extremities appropriately. Extremities: Cap refill <2 seconds.  No peripheral edema. Derm: No rashes grossly. Neuro: No focal neurological deficit. Psych: Tearful affect, quiet, alert to self.  Significant Procedures: None  Significant Labs and Imaging:  Recent Labs  Lab 07/15/23 1812  WBC 7.0  HGB 11.1*  HCT 34.7*  PLT 284   Recent Labs  Lab 07/15/23 2010 07/16/23 0104 07/16/23 0421 07/16/23 1521 07/17/23 0550  NA 134* 133* 137 138 135  K 2.7* 2.9* 3.3* 3.3* 3.9  CL 96* 96* 107 106 101  CO2 28 28 21* 25 24  GLUCOSE 118* 113* 103* 130* 88  BUN 13 13 11 8 6   CREATININE 1.74* 1.65* 1.42* 1.19* 1.20*  CALCIUM 8.9 8.6* 7.9* 8.4* 8.2*  MG  --  2.3  --   --  1.8  ALKPHOS 79 81  --   --  62  AST 64* 68*  --   --  44*  ALT 15 15  --   --  13  ALBUMIN 2.7* 2.7*  --   --  2.2*    - CXR: No evidence of PNA. - UA: Large leuks, WBC >50, small blood, RBC 0-5 - RPP  (20 pathogens): Negative for COVID, flu, other viral pathogens  Results/Tests Pending at Time of Discharge: None  Discharge Medications:  Allergies as of 07/17/2023   No Known Allergies      Medication List     STOP taking these medications    indapamide 1.25 MG tablet Commonly known as: LOZOL   losartan 100 MG tablet Commonly known as: COZAAR       TAKE these medications    atorvastatin 40 MG tablet Commonly known as: LIPITOR Take 1 tablet (40 mg total) by mouth daily.   Depakote ER 250 MG 24 hr tablet Generic drug: divalproex Take 2 tablets (500 mg total) by mouth 2 (two) times daily.   famotidine 20 MG tablet Commonly known as: PEPCID Take 1 tablet (20 mg total) by mouth 2 (two) times daily for 14 days.   levocetirizine 5 MG tablet Commonly known as: XYZAL TAKE 1 TABLET BY MOUTH 2 TIMES DAILY   TEGretol 200 MG tablet Generic drug: carbamazepine TAKE 1 AND 1/2 TABLETS BY MOUTH 2 TIMES DAILY in the morning and at bedtime   Topamax 25 MG tablet Generic drug: topiramate TAKE 3 TABLETS BY MOUTH 2 TIMES DAILY        Discharge Instructions: Please refer to Patient Instructions section of EMR for full details.  Patient was counseled important signs and symptoms that should prompt return to medical care, changes in medications, dietary instructions, activity restrictions, and follow up appointments.   Follow-Up Appointments: Future Appointments  Date Time Provider Department Center  07/19/2023  1:50 PM Celine Mans, MD [facility]  03/24/2024  1:30 PM [facility] ANNUAL WELLNESS VISIT [facility]    Nelia Shi, MD 07/17/2023, 1:57 PM PGY-1, [facility]  I have verified that the resident's  findings are accurately documented in the resident's note. I have made edits and changes where appropriate, and agree with plan.  Celine Mans, MD, PGY-2 [facility] 1:57 PM 07/17/2023

## 2023-07-17 NOTE — Progress Notes (Signed)
CSW met with pt and pt's aunt at bedside. Pt is from home and has support via her legal guardian, her grandmother and aunt. Pt needs assistance with transportation home. CSW provided taxi voucher.   No other needs identified at this time. TOC will sign off, please consult again if TOC needs arise.         Patient Goals and CMS Choice        Expected Discharge Plan and Services           Expected Discharge Date: 07/17/23                                    Prior Living Arrangements/Services                       Activities of Daily Living   ADL Screening (condition at time of admission) Independently performs ADLs?: Yes (appropriate for developmental age) Is the patient deaf or have difficulty hearing?: No Does the patient have difficulty seeing, even when wearing glasses/contacts?: No Does the patient have difficulty concentrating, remembering, or making decisions?: Yes  Permission Sought/Granted                  Emotional Assessment              Admission diagnosis:  Hypokalemia [E87.6] Acute cystitis without hematuria [N30.00] AKI (acute kidney injury) (HCC) [N17.9] Acute cough [R05.1] Patient Active Problem List   Diagnosis Date Noted   Acute cystitis without hematuria 07/16/2023   AKI (acute kidney injury) (HCC) 07/15/2023   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   Hot flashes due to menopause 06/30/2021   Hyperlipidemia associated with type 2 diabetes mellitus (HCC) 11/24/2020   Diabetes mellitus without complication (HCC) 11/23/2020   Hypokalemia 11/23/2020   Angioedema 12/10/2019   Lesion of mandible 12/10/2019   Seasonal allergies 11/20/2019   Essential hypertension 10/23/2019   Seizure disorder (HCC) 02/23/2016   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   Obesity (BMI 30.0-34.9) 10/13/2009   Acute cough 11/26/2008   Infantile cerebral palsy (HCC) 10/11/2006   PCP:  Celine Mans, MD Pharmacy:   [facility] - [place], [place] - [address] [place] [address] Phone: [phone] Fax: [phone]  [facility] - [place], [place] - [address] [place] [address] Phone: [phone] Fax: [phone]  [id number] - [address], [place] - [address] [place] [address] Phone: [phone] Fax: [phone]  [facility] [address] [place] [address] Phone: [phone] Fax: [phone]     Social Determinants of Health (SDOH) Interventions Transportation Interventions: Taxi Voucher Given  Readmission Risk Interventions     No data to display

## 2023-07-17 NOTE — Plan of Care (Signed)

## 2023-07-17 NOTE — Assessment & Plan Note (Deleted)
K 2.7>3.3, IV still running.  Patient has a history of hypokalemia, but is on several medications at home counteract. -10 mEq IV potassium x 4 -Follow-up PM BMP at 1600 -AM BMP, mag

## 2023-07-17 NOTE — Discharge Instructions (Signed)
Dear Patricia White,   Thank you for letting us participate in your care! In this section, you will find a brief hospital admission summary of why you were admitted to the hospital, what happened during your admission, your diagnosis/diagnoses, and recommended follow up.  Primary diagnosis: Dehydration in the setting of likely viral illness Treatment plan: You gave you potassium and fluid to help rehydrate you. Secondary diagnosis: Urinary tract infection Treatment plan: You were treated with antibiotics for the infection   POST-HOSPITAL & CARE INSTRUCTIONS We recommend following up with your PCP within 1 week from being discharged from the hospital. Please let PCP/Specialists know of any changes in medications that were made which you will be able to see in the medications section of this packet.  DOCTOR'S APPOINTMENTS & FOLLOW UP Future Appointments  Date Time Provider Department Center  07/18/2023  2:15 PM Ihor Austin, NP [facility] None  03/24/2024  1:30 PM [facility] ANNUAL WELLNESS VISIT [facility]     Thank you for choosing [facility]! Take care and be well!  Family Medicine Teaching Service Inpatient Team [place]  [facility]  [address] [place], [place] [address] [phone]

## 2023-07-17 NOTE — Assessment & Plan Note (Deleted)
Cr 1.74>1.42 (baseline ~1.0). Likely secondary to poor PO intake and concurrent BP meds, was started on indapamide and not rechecked outpatient due to missed appointment. -1 L bolus lactated Ringer's x2 -Encourage PO intake, nursing care order to offer 250 mL Q4h, ice cream and pudding offered -Hold home antihypertensive agents -AM CMP (given slight bili elevation), trend creatinine

## 2023-07-18 ENCOUNTER — Ambulatory Visit: Payer: 59 | Admitting: Adult Health

## 2023-07-18 ENCOUNTER — Telehealth: Payer: Self-pay

## 2023-07-18 NOTE — Transitions of Care (Post Inpatient/ED Visit) (Signed)
   07/18/2023  Name: Patricia White MRN: [id number] DOB: January 25, 1971  Today's TOC FU Call Status: Today's TOC FU Call Status:: Unsuccessful Call (1st Attempt) Unsuccessful Call (1st Attempt) Date: 07/18/23  Attempted to reach the patient regarding the most recent Inpatient/ED visit.  Follow Up Plan: Additional outreach attempts will be made to reach the patient to complete the Transitions of Care (Post Inpatient/ED visit) call.   Jodelle Gross RN, BSN, CCM RN Care Manager  Transitions of Care  [facility]  [phone]

## 2023-07-19 ENCOUNTER — Telehealth: Payer: Self-pay

## 2023-07-19 ENCOUNTER — Ambulatory Visit: Payer: Self-pay | Admitting: Family Medicine

## 2023-07-19 NOTE — Transitions of Care (Post Inpatient/ED Visit) (Signed)
   07/19/2023  Name: Patricia White MRN: [id number] DOB: [birth year]  Today's TOC FU Call Status: Today's TOC FU Call Status:: Unsuccessful Call (2nd Attempt) Unsuccessful Call (2nd Attempt) Date: 07/19/23  Attempted to reach the patient regarding the most recent Inpatient/ED visit.  Follow Up Plan: Additional outreach attempts will be made to reach the patient to complete the Transitions of Care (Post Inpatient/ED visit) call.   Jodelle Gross RN, BSN, CCM RN Care Manager  Transitions of Care  [facility] - [facility]  [phone]

## 2023-07-19 NOTE — Progress Notes (Unsigned)
    SUBJECTIVE:   CHIEF COMPLAINT / HPI: hospital f/u  PCP Follow-up Recommendations: BMP in 1 week Restart BP meds as appropriate  PERTINENT  PMH / PSH: T2DM, HTN, Seizure disorder, Cerebral palsy  OBJECTIVE:   There were no vitals taken for this visit.  ***  ASSESSMENT/PLAN:   Assessment & Plan AKI (acute kidney injury) (HCC)  No follow-ups on file.  Celine Mans, MD [facility]

## 2023-07-20 ENCOUNTER — Telehealth: Payer: Self-pay

## 2023-07-20 LAB — CULTURE, BLOOD (ROUTINE X 2): Culture: NO GROWTH

## 2023-07-20 NOTE — Transitions of Care (Post Inpatient/ED Visit) (Signed)
   07/20/2023  Name: Patricia White MRN: [id number] DOB: 18-Jul-1971  Today's TOC FU Call Status: Today's TOC FU Call Status:: Unsuccessful Call (3rd Attempt) Unsuccessful Call (3rd Attempt) Date: 07/20/23  Attempted to reach the patient regarding the most recent Inpatient/ED visit.  Follow Up Plan: No further outreach attempts will be made at this time. We have been unable to contact the patient.  Lonia Chimera, RN, BSN, CEN [employer]- Transition of Care Team.  [facility] [phone]

## 2023-07-21 LAB — CULTURE, BLOOD (ROUTINE X 2): Culture: NO GROWTH

## 2023-07-27 ENCOUNTER — Other Ambulatory Visit: Payer: Self-pay | Admitting: Family Medicine

## 2023-07-27 DIAGNOSIS — E1169 Type 2 diabetes mellitus with other specified complication: Secondary | ICD-10-CM

## 2023-07-28 ENCOUNTER — Other Ambulatory Visit: Payer: Self-pay | Admitting: Adult Health

## 2023-07-28 DIAGNOSIS — G40909 Epilepsy, unspecified, not intractable, without status epilepticus: Secondary | ICD-10-CM

## 2023-07-30 ENCOUNTER — Other Ambulatory Visit: Payer: Self-pay | Admitting: Adult Health

## 2023-07-30 ENCOUNTER — Other Ambulatory Visit: Payer: Self-pay | Admitting: Diagnostic Neuroimaging

## 2023-07-30 DIAGNOSIS — G40909 Epilepsy, unspecified, not intractable, without status epilepticus: Secondary | ICD-10-CM

## 2023-08-01 ENCOUNTER — Other Ambulatory Visit: Payer: Self-pay

## 2023-08-01 ENCOUNTER — Ambulatory Visit: Payer: 59 | Admitting: Family Medicine

## 2023-08-01 ENCOUNTER — Telehealth: Payer: Self-pay | Admitting: Family Medicine

## 2023-08-01 MED ORDER — CARBAMAZEPINE 200 MG PO TABS: ORAL_TABLET | ORAL | 0 refills | Status: DC

## 2023-08-01 MED ORDER — TOPIRAMATE 25 MG PO TABS: 75.0000 mg | ORAL_TABLET | Freq: Two times a day (BID) | ORAL | 0 refills | Status: DC

## 2023-08-01 MED ORDER — DIVALPROEX SODIUM 250 MG PO DR TAB: 500.0000 mg | DELAYED_RELEASE_TABLET | Freq: Two times a day (BID) | ORAL | 0 refills | Status: DC

## 2023-08-01 NOTE — Telephone Encounter (Signed)
Attempted to call all numbers listed for patient's guardians and caregivers.  Reached full voicemail and not use number.  Will attempt to call again at later date.  Attempted to reach out regarding multiple missed clinic visits, and to reschedule patient.

## 2023-08-01 NOTE — Addendum Note (Signed)
Addended by: Berneice Gandy A on: 08/01/2023 11:34 AM   Modules accepted: Orders

## 2023-08-01 NOTE — Progress Notes (Deleted)
    SUBJECTIVE:   CHIEF COMPLAINT / HPI: Hospital f/u  Admitted 12/1-12/3 for AKI likely in the setting of dehydration. Had significant hypokalemia as well which was repleted.  PERTINENT  PMH / PSH: HTN, T2DM, HLD, Cerebral palsy, Seizure disorder  OBJECTIVE:   There were no vitals taken for this visit.  ***  ASSESSMENT/PLAN:   Assessment & Plan  No follow-ups on file.  Celine Mans, MD [facility]

## 2023-08-03 ENCOUNTER — Other Ambulatory Visit: Payer: Self-pay | Admitting: Diagnostic Neuroimaging

## 2023-08-03 ENCOUNTER — Other Ambulatory Visit: Payer: Self-pay | Admitting: Adult Health

## 2023-08-03 DIAGNOSIS — G40909 Epilepsy, unspecified, not intractable, without status epilepticus: Secondary | ICD-10-CM
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
## 2023-08-07 ENCOUNTER — Other Ambulatory Visit: Payer: Self-pay | Admitting: Family Medicine

## 2023-08-07 DIAGNOSIS — I10 Essential (primary) hypertension: Secondary | ICD-10-CM

## 2023-08-07 NOTE — Telephone Encounter (Signed)
Last ov stated: - continue divalproex ER 500mg  twice a day

## 2023-08-09 ENCOUNTER — Encounter: Payer: Self-pay | Admitting: Family Medicine

## 2023-08-09 ENCOUNTER — Ambulatory Visit (INDEPENDENT_AMBULATORY_CARE_PROVIDER_SITE_OTHER): Payer: 59 | Admitting: Family Medicine

## 2023-08-09 VITALS — BP 102/86 | HR 105 | Wt 166.2 lb

## 2023-08-09 DIAGNOSIS — Z1231 Encounter for screening mammogram for malignant neoplasm of breast: Secondary | ICD-10-CM

## 2023-08-09 DIAGNOSIS — N3000 Acute cystitis without hematuria: Secondary | ICD-10-CM | POA: Diagnosis not present

## 2023-08-09 DIAGNOSIS — N179 Acute kidney failure, unspecified: Secondary | ICD-10-CM

## 2023-08-09 NOTE — Assessment & Plan Note (Signed)
Patient unable to urinate today for microscopic UA.  Will attempt again at follow-up in 1 month

## 2023-08-09 NOTE — Progress Notes (Signed)
    SUBJECTIVE:   CHIEF COMPLAINT / HPI: Hospital follow-up  Patient presents today with primary care taker, aunt Shamecka Deschler.  Hospitalized 12/01-12/03 for AKI in the setting of suspected UTI. Doing okay since leaving hospital. Here for hospital follow-up.  Reports that nausea has been eating well since discharge and drinking well.  Hospital f/u recommendations: - Repeat CMP for AST at follow up--suspect elevation due to acute illness  - Repeat UA for hematuria---as culture negative, may need microscopic hematuria evaluation   PERTINENT  PMH / PSH: HTN, T2DM, HLD, Seizure disorder, Cerebral palsy  OBJECTIVE:   BP 102/86   Pulse (!) 105   Wt 166 lb 3.2 oz (75.4 kg)   SpO2 100%   BMI 29.44 kg/m   General: NAD  Neuro: A&O Cardiovascular: RRR, no murmurs, no peripheral edema Respiratory: normal WOB on RA, CTAB, no wheezes, ronchi or rales Abdomen: soft, NTTP, no rebound or guarding Extremities: Moving all 4 extremities equally   ASSESSMENT/PLAN:   Assessment & Plan AKI (acute kidney injury) (HCC) Repeat CMP today.  Patient appears well-hydrated today. Acute cystitis without hematuria Patient unable to urinate today for microscopic UA.  Will attempt again at follow-up in 1 month Screening mammogram for breast cancer Mammogram ordered.   Return in about 4 weeks (around 09/06/2023).  Celine Mans, MD [facility]

## 2023-08-09 NOTE — Assessment & Plan Note (Signed)
Repeat CMP today.  Patient appears well-hydrated today.

## 2023-08-09 NOTE — Patient Instructions (Addendum)
It was great to see you! Thank you for allowing me to participate in your care!  Our plans for today:  - I have placed an order for a mammogram. You may go to the breast center to have this done. -Please make a follow-up appointment in 2 to 4 weeks to discuss your blood pressure and diabetes. -I will let you know the results of your labs.   Please arrive 15 minutes PRIOR to your next scheduled appointment time! If you do not, this affects OTHER patients' care.  Take care and seek immediate care sooner if you develop any concerns.   Celine Mans, MD, PGY-2 [facility] Family Medicine 11:24 AM 08/09/2023  [facility] Family [facility]

## 2023-08-10 ENCOUNTER — Telehealth: Payer: Self-pay | Admitting: Family Medicine

## 2023-08-10 LAB — COMPREHENSIVE METABOLIC PANEL
ALT: 7 [IU]/L (ref 0–32)
AST: 15 [IU]/L (ref 0–40)
Albumin: 4 g/dL (ref 3.9–4.9)
Alkaline Phosphatase: 114 [IU]/L (ref 44–121)
BUN/Creatinine Ratio: 6 — ABNORMAL LOW (ref 9–23)
BUN: 7 mg/dL (ref 6–24)
Bilirubin Total: 0.5 mg/dL (ref 0.0–1.2)
CO2: 25 mmol/L (ref 20–29)
Calcium: 9.3 mg/dL (ref 8.7–10.2)
Chloride: 97 mmol/L (ref 96–106)
Creatinine, Ser: 1.22 mg/dL — ABNORMAL HIGH (ref 0.57–1.00)
Globulin, Total: 5.1 g/dL — ABNORMAL HIGH (ref 1.5–4.5)
Glucose: 83 mg/dL (ref 70–99)
Potassium: 3.2 mmol/L — ABNORMAL LOW (ref 3.5–5.2)
Sodium: 139 mmol/L (ref 134–144)
Total Protein: 9.1 g/dL — ABNORMAL HIGH (ref 6.0–8.5)
eGFR: 54 mL/min/{1.73_m2} — ABNORMAL LOW (ref >59)

## 2023-08-10 NOTE — Telephone Encounter (Signed)
Called both numbers listed in patient's chart regarding lab results.  Unable to leave voicemail at either number.  Will attempt again at later date.

## 2023-08-11 ENCOUNTER — Other Ambulatory Visit: Payer: Self-pay | Admitting: Adult Health

## 2023-08-11 DIAGNOSIS — G40909 Epilepsy, unspecified, not intractable, without status epilepticus: Secondary | ICD-10-CM

## 2023-08-14 ENCOUNTER — Other Ambulatory Visit: Payer: Self-pay | Admitting: Diagnostic Neuroimaging

## 2023-08-14 ENCOUNTER — Other Ambulatory Visit: Payer: Self-pay | Admitting: Family Medicine

## 2023-08-14 ENCOUNTER — Other Ambulatory Visit: Payer: Self-pay | Admitting: Adult Health

## 2023-08-14 DIAGNOSIS — I10 Essential (primary) hypertension: Secondary | ICD-10-CM

## 2023-08-15 ENCOUNTER — Telehealth: Payer: Self-pay | Admitting: Family Medicine

## 2023-08-15 NOTE — Telephone Encounter (Signed)
 Second attempt to contact patient regarding lab results from office visit on 12/26.  Contacted both numbers listed in chart x 2 but unable to leave a voicemail.  Mild hypokalemia with K3.1, recommend oral potassium supplementation and repeat lab work.  Will send letter and if patient caregiver follows up please forward to PCP.  Izetta Nap, DO

## 2023-09-14 ENCOUNTER — Other Ambulatory Visit: Payer: Self-pay | Admitting: Adult Health

## 2023-09-17 NOTE — Telephone Encounter (Signed)
Last seen on 09/29/21 No follow up scheduled

## 2023-09-25 ENCOUNTER — Other Ambulatory Visit: Payer: Self-pay | Admitting: Family Medicine

## 2023-09-25 ENCOUNTER — Other Ambulatory Visit: Payer: Self-pay | Admitting: Adult Health

## 2023-09-25 ENCOUNTER — Other Ambulatory Visit: Payer: Self-pay | Admitting: Diagnostic Neuroimaging

## 2023-09-25 DIAGNOSIS — G40909 Epilepsy, unspecified, not intractable, without status epilepticus: Secondary | ICD-10-CM

## 2023-09-25 DIAGNOSIS — I10 Essential (primary) hypertension: Secondary | ICD-10-CM

## 2023-09-26 NOTE — Telephone Encounter (Signed)
Last seen on 06/27/22 No follow up scheduled  per note" Return for with NP Ihor Austin) --> change Feb 2024 appt to Nov 2024. "   Pt needs to scheduled updated visit.

## 2023-09-26 NOTE — Telephone Encounter (Signed)
Last seen on 06/27/22 No follow up scheduled  Note attached to Rx to call and schedule visit.

## 2023-10-02 ENCOUNTER — Other Ambulatory Visit: Payer: Self-pay | Admitting: Adult Health

## 2023-10-02 ENCOUNTER — Other Ambulatory Visit: Payer: Self-pay | Admitting: Family Medicine

## 2023-10-02 ENCOUNTER — Other Ambulatory Visit: Payer: Self-pay | Admitting: Diagnostic Neuroimaging

## 2023-10-02 DIAGNOSIS — I10 Essential (primary) hypertension: Secondary | ICD-10-CM

## 2023-10-03 NOTE — Telephone Encounter (Signed)
Last seen on 06/27/22 per note " Return for with NP Ihor Austin) --> change Feb 2024 appt to Nov 2024.    No follow up scheduled

## 2023-11-01 ENCOUNTER — Other Ambulatory Visit: Payer: Self-pay

## 2023-11-01 ENCOUNTER — Inpatient Hospital Stay (HOSPITAL_COMMUNITY)
Admission: EM | Admit: 2023-11-01 | Discharge: 2023-11-16 | DRG: 682 | Disposition: A | Discharge status: Home Health | Source: Ambulatory Visit | Attending: Family Medicine | Admitting: Family Medicine

## 2023-11-01 ENCOUNTER — Encounter (HOSPITAL_COMMUNITY): Payer: Self-pay | Admitting: Family Medicine

## 2023-11-01 ENCOUNTER — Encounter (HOSPITAL_COMMUNITY): Payer: Self-pay | Admitting: Emergency Medicine

## 2023-11-01 ENCOUNTER — Ambulatory Visit (HOSPITAL_COMMUNITY): Admission: EM | Admit: 2023-11-01 | Discharge: 2023-11-01 | Disposition: A | Discharge status: Home / Self Care

## 2023-11-01 DIAGNOSIS — K2970 Gastritis, unspecified, without bleeding: Secondary | ICD-10-CM | POA: Diagnosis not present

## 2023-11-01 DIAGNOSIS — R110 Nausea: Secondary | ICD-10-CM

## 2023-11-01 DIAGNOSIS — E785 Hyperlipidemia, unspecified: Secondary | ICD-10-CM | POA: Diagnosis not present

## 2023-11-01 DIAGNOSIS — R630 Anorexia: Secondary | ICD-10-CM

## 2023-11-01 DIAGNOSIS — E11649 Type 2 diabetes mellitus with hypoglycemia without coma: Secondary | ICD-10-CM | POA: Diagnosis not present

## 2023-11-01 DIAGNOSIS — N390 Urinary tract infection, site not specified: Secondary | ICD-10-CM | POA: Insufficient documentation

## 2023-11-01 DIAGNOSIS — R109 Unspecified abdominal pain: Secondary | ICD-10-CM | POA: Diagnosis not present

## 2023-11-01 DIAGNOSIS — E878 Other disorders of electrolyte and fluid balance, not elsewhere classified: Secondary | ICD-10-CM

## 2023-11-01 DIAGNOSIS — D61818 Other pancytopenia: Secondary | ICD-10-CM | POA: Diagnosis not present

## 2023-11-01 DIAGNOSIS — F79 Unspecified intellectual disabilities: Secondary | ICD-10-CM | POA: Diagnosis present

## 2023-11-01 DIAGNOSIS — E860 Dehydration: Secondary | ICD-10-CM | POA: Diagnosis not present

## 2023-11-01 DIAGNOSIS — K317 Polyp of stomach and duodenum: Secondary | ICD-10-CM

## 2023-11-01 DIAGNOSIS — R627 Adult failure to thrive: Secondary | ICD-10-CM | POA: Insufficient documentation

## 2023-11-01 DIAGNOSIS — I959 Hypotension, unspecified: Secondary | ICD-10-CM

## 2023-11-01 DIAGNOSIS — J45909 Unspecified asthma, uncomplicated: Secondary | ICD-10-CM | POA: Diagnosis not present

## 2023-11-01 DIAGNOSIS — R1084 Generalized abdominal pain: Secondary | ICD-10-CM | POA: Diagnosis not present

## 2023-11-01 DIAGNOSIS — E43 Unspecified severe protein-calorie malnutrition: Secondary | ICD-10-CM | POA: Insufficient documentation

## 2023-11-01 DIAGNOSIS — N179 Acute kidney failure, unspecified: Secondary | ICD-10-CM | POA: Diagnosis not present

## 2023-11-01 DIAGNOSIS — G809 Cerebral palsy, unspecified: Secondary | ICD-10-CM | POA: Diagnosis not present

## 2023-11-01 DIAGNOSIS — Z66 Do not resuscitate: Secondary | ICD-10-CM | POA: Diagnosis not present

## 2023-11-01 DIAGNOSIS — K5909 Other constipation: Secondary | ICD-10-CM | POA: Diagnosis present

## 2023-11-01 DIAGNOSIS — B370 Candidal stomatitis: Secondary | ICD-10-CM | POA: Diagnosis not present

## 2023-11-01 DIAGNOSIS — E876 Hypokalemia: Secondary | ICD-10-CM | POA: Diagnosis not present

## 2023-11-01 DIAGNOSIS — Z833 Family history of diabetes mellitus: Secondary | ICD-10-CM

## 2023-11-01 DIAGNOSIS — K2990 Gastroduodenitis, unspecified, without bleeding: Secondary | ICD-10-CM | POA: Diagnosis not present

## 2023-11-01 DIAGNOSIS — Z79899 Other long term (current) drug therapy: Secondary | ICD-10-CM

## 2023-11-01 DIAGNOSIS — G40909 Epilepsy, unspecified, not intractable, without status epilepticus: Secondary | ICD-10-CM

## 2023-11-01 DIAGNOSIS — I10 Essential (primary) hypertension: Secondary | ICD-10-CM | POA: Diagnosis present

## 2023-11-01 DIAGNOSIS — R1110 Vomiting, unspecified: Secondary | ICD-10-CM | POA: Diagnosis not present

## 2023-11-01 DIAGNOSIS — K4020 Bilateral inguinal hernia, without obstruction or gangrene, not specified as recurrent: Secondary | ICD-10-CM | POA: Diagnosis not present

## 2023-11-01 DIAGNOSIS — Z7189 Other specified counseling: Secondary | ICD-10-CM | POA: Diagnosis not present

## 2023-11-01 DIAGNOSIS — Z515 Encounter for palliative care: Secondary | ICD-10-CM | POA: Diagnosis not present

## 2023-11-01 DIAGNOSIS — Z8249 Family history of ischemic heart disease and other diseases of the circulatory system: Secondary | ICD-10-CM

## 2023-11-01 DIAGNOSIS — Z789 Other specified health status: Secondary | ICD-10-CM

## 2023-11-01 DIAGNOSIS — K219 Gastro-esophageal reflux disease without esophagitis: Secondary | ICD-10-CM | POA: Diagnosis present

## 2023-11-01 DIAGNOSIS — K3189 Other diseases of stomach and duodenum: Secondary | ICD-10-CM | POA: Diagnosis present

## 2023-11-01 DIAGNOSIS — R112 Nausea with vomiting, unspecified: Secondary | ICD-10-CM | POA: Diagnosis not present

## 2023-11-01 DIAGNOSIS — R569 Unspecified convulsions: Secondary | ICD-10-CM

## 2023-11-01 DIAGNOSIS — Z6826 Body mass index (BMI) 26.0-26.9, adult: Secondary | ICD-10-CM

## 2023-11-01 LAB — URINALYSIS, W/ REFLEX TO CULTURE (INFECTION SUSPECTED)
Bilirubin Urine: NEGATIVE
Glucose, UA: NEGATIVE mg/dL
Hgb urine dipstick: NEGATIVE
Ketones, ur: 5 mg/dL — AB
Nitrite: NEGATIVE
Protein, ur: NEGATIVE mg/dL
Specific Gravity, Urine: 1.013 (ref 1.005–1.030)
pH: 6 (ref 5.0–8.0)

## 2023-11-01 LAB — BASIC METABOLIC PANEL
Anion gap: 14 (ref 5–15)
BUN: 40 mg/dL — ABNORMAL HIGH (ref 6–20)
CO2: 26 mmol/L (ref 22–32)
Calcium: 8.8 mg/dL — ABNORMAL LOW (ref 8.9–10.3)
Chloride: 96 mmol/L — ABNORMAL LOW (ref 98–111)
Creatinine, Ser: 1.89 mg/dL — ABNORMAL HIGH (ref 0.44–1.00)
GFR, Estimated: 32 mL/min — ABNORMAL LOW (ref >60)
Glucose, Bld: 103 mg/dL — ABNORMAL HIGH (ref 70–99)
Potassium: 2.9 mmol/L — ABNORMAL LOW (ref 3.5–5.1)
Sodium: 136 mmol/L (ref 135–145)

## 2023-11-01 LAB — COMPREHENSIVE METABOLIC PANEL
ALT: 16 U/L (ref 0–44)
AST: 32 U/L (ref 15–41)
Albumin: 3.2 g/dL — ABNORMAL LOW (ref 3.5–5.0)
Alkaline Phosphatase: 51 U/L (ref 38–126)
Anion gap: 9 (ref 5–15)
BUN: 45 mg/dL — ABNORMAL HIGH (ref 6–20)
CO2: 33 mmol/L — ABNORMAL HIGH (ref 22–32)
Calcium: 8.9 mg/dL (ref 8.9–10.3)
Chloride: 93 mmol/L — ABNORMAL LOW (ref 98–111)
Creatinine, Ser: 2.22 mg/dL — ABNORMAL HIGH (ref 0.44–1.00)
GFR, Estimated: 26 mL/min — ABNORMAL LOW (ref >60)
Glucose, Bld: 95 mg/dL (ref 70–99)
Potassium: 2.5 mmol/L — CL (ref 3.5–5.1)
Sodium: 135 mmol/L (ref 135–145)
Total Bilirubin: 1 mg/dL (ref 0.0–1.2)
Total Protein: 8 g/dL (ref 6.5–8.1)

## 2023-11-01 LAB — CBC
HCT: 30.2 % — ABNORMAL LOW (ref 36.0–46.0)
Hemoglobin: 9.9 g/dL — ABNORMAL LOW (ref 12.0–15.0)
MCH: 30.7 pg (ref 26.0–34.0)
MCHC: 32.8 g/dL (ref 30.0–36.0)
MCV: 93.8 fL (ref 80.0–100.0)
Platelets: 228 10*3/uL (ref 150–400)
RBC: 3.22 MIL/uL — ABNORMAL LOW (ref 3.87–5.11)
RDW: 12.6 % (ref 11.5–15.5)
WBC: 4 10*3/uL (ref 4.0–10.5)
nRBC: 0 % (ref 0.0–0.2)

## 2023-11-01 MED ORDER — POTASSIUM CHLORIDE 20 MEQ PO PACK: 80.0000 meq | PACK | Freq: Once | ORAL | Status: DC

## 2023-11-01 MED ORDER — DIVALPROEX SODIUM 500 MG PO DR TAB
500.0000 mg | DELAYED_RELEASE_TABLET | Freq: Two times a day (BID) | ORAL | Status: DC
  Administered 2023-11-01 – 2023-11-03 (×5): 500 mg via ORAL
  Filled 2023-11-01 (×9): qty 1

## 2023-11-01 MED ORDER — CARBAMAZEPINE 100 MG PO CHEW
300.0000 mg | CHEWABLE_TABLET | Freq: Two times a day (BID) | ORAL | Status: DC
  Administered 2023-11-01 – 2023-11-03 (×5): 300 mg via ORAL
  Filled 2023-11-01 (×10): qty 3

## 2023-11-01 MED ORDER — ACETAMINOPHEN 650 MG RE SUPP: 650.0000 mg | Freq: Four times a day (QID) | RECTAL | Status: DC | PRN

## 2023-11-01 MED ORDER — SODIUM CHLORIDE 0.9 % IV BOLUS
1000.0000 mL | Freq: Once | INTRAVENOUS | Status: AC
  Administered 2023-11-01: 1000 mL via INTRAVENOUS

## 2023-11-01 MED ORDER — POTASSIUM CHLORIDE CRYS ER 20 MEQ PO TBCR
40.0000 meq | EXTENDED_RELEASE_TABLET | Freq: Once | ORAL | Status: DC
  Filled 2023-11-01: qty 2

## 2023-11-01 MED ORDER — ATORVASTATIN CALCIUM 40 MG PO TABS
40.0000 mg | ORAL_TABLET | Freq: Every day | ORAL | Status: DC
  Administered 2023-11-02 – 2023-11-16 (×10): 40 mg via ORAL
  Filled 2023-11-01 (×14): qty 1

## 2023-11-01 MED ORDER — TOPIRAMATE 25 MG PO TABS
75.0000 mg | ORAL_TABLET | Freq: Two times a day (BID) | ORAL | Status: DC
  Administered 2023-11-01 – 2023-11-04 (×6): 75 mg via ORAL
  Filled 2023-11-01 (×9): qty 3

## 2023-11-01 MED ORDER — FAMOTIDINE 20 MG PO TABS
20.0000 mg | ORAL_TABLET | Freq: Two times a day (BID) | ORAL | Status: DC
  Administered 2023-11-01 – 2023-11-16 (×21): 20 mg via ORAL
  Filled 2023-11-01 (×27): qty 1

## 2023-11-01 MED ORDER — ENOXAPARIN SODIUM 30 MG/0.3ML IJ SOSY: 30.0000 mg | PREFILLED_SYRINGE | INTRAMUSCULAR | Status: DC

## 2023-11-01 MED ORDER — POTASSIUM CHLORIDE 20 MEQ PO PACK
80.0000 meq | PACK | Freq: Once | ORAL | Status: AC
  Administered 2023-11-01: 80 meq via ORAL
  Filled 2023-11-01: qty 4

## 2023-11-01 MED ORDER — POTASSIUM CHLORIDE CRYS ER 20 MEQ PO TBCR
80.0000 meq | EXTENDED_RELEASE_TABLET | Freq: Once | ORAL | Status: AC
  Administered 2023-11-01: 80 meq via ORAL

## 2023-11-01 MED ORDER — ACETAMINOPHEN 325 MG PO TABS
650.0000 mg | ORAL_TABLET | Freq: Four times a day (QID) | ORAL | Status: DC | PRN
  Administered 2023-11-15: 650 mg via ORAL
  Filled 2023-11-01: qty 2

## 2023-11-01 MED ORDER — ONDANSETRON HCL 4 MG/2ML IJ SOLN
4.0000 mg | Freq: Four times a day (QID) | INTRAMUSCULAR | Status: DC | PRN
  Administered 2023-11-06: 4 mg via INTRAVENOUS
  Filled 2023-11-01 (×2): qty 2

## 2023-11-01 MED ORDER — ONDANSETRON HCL 4 MG PO TABS
4.0000 mg | ORAL_TABLET | Freq: Four times a day (QID) | ORAL | Status: DC | PRN
  Administered 2023-11-02: 4 mg via ORAL
  Filled 2023-11-01: qty 1

## 2023-11-01 MED ORDER — NYSTATIN 100000 UNIT/ML MT SUSP
5.0000 mL | Freq: Four times a day (QID) | OROMUCOSAL | Status: DC
  Administered 2023-11-01 – 2023-11-03 (×7): 500000 [IU] via ORAL
  Filled 2023-11-01 (×18): qty 5

## 2023-11-01 MED ORDER — SODIUM CHLORIDE 0.45 % IV SOLN: INTRAVENOUS | Status: DC

## 2023-11-01 MED ORDER — POTASSIUM CHLORIDE IN NACL 20-0.9 MEQ/L-% IV SOLN
INTRAVENOUS | Status: DC
  Filled 2023-11-01 (×2): qty 1000

## 2023-11-01 MED ORDER — SODIUM CHLORIDE 0.9 % IV SOLN
2.0000 g | Freq: Once | INTRAVENOUS | Status: AC
  Administered 2023-11-01: 2 g via INTRAVENOUS
  Filled 2023-11-01: qty 20

## 2023-11-01 NOTE — Assessment & Plan Note (Addendum)
 Immunocompetent, not on inhalers. Questionable oral hygiene, however no other major risk factors. Differential includes geopgraphic tongue/oral lichen planus, but all prior exams per char review state "normal tongue." Hx of urticaria treated with Xolair, but does not appear this has been given since 2023. Given presentation above, will trial treatment for thrush. - Oral Nystatin suspension 4 times daily

## 2023-11-01 NOTE — ED Provider Notes (Signed)
 [facility]    CSN: [id number] Arrival date & time: 11/01/23  1125      History   Chief Complaint Chief Complaint  Patient presents with   Cough    HPI Patricia White is a 53 y.o. female.   Patient presents today with aunt who is primary caregiver for 3-day history of cough and vomiting.  Aunt reports she has not been able to keep down any food or fluids today and has had poor oral intake for the past 10 days.  Patient with history of cerebral palsy, mental retardation, and seizures at baseline and is nonverbal.  Celine Ahr reports he has been taking medication as prescribed.  Has been giving over-the-counter NyQuil, TheraFlu, Alka-Seltzer without any improvement.    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 No pertinent surgical history.  OB History   No obstetric history on file.      Home Medications    Prior to Admission medications   Medication Sig Start Date End Date Taking? Authorizing Provider  atorvastatin (LIPITOR) 40 MG tablet TAKE 1 TABLET BY MOUTH DAILY 07/30/23   Celine Mans, MD  divalproex (DEPAKOTE) 250 MG DR tablet Take 2 tablets (500 mg total) by mouth 2 (two) times daily. **PATIENT NEEDS AN OFFICE VISIT FOR FUTURE Rx REFILLS** 08/01/23    Ihor Austin, NP  famotidine (PEPCID) 20 MG tablet Take 1 tablet (20 mg total) by mouth 2 (two) times daily for 14 days. 07/17/23 07/31/23  Celine Mans, MD  levocetirizine (XYZAL) 5 MG tablet TAKE 1 TABLET BY MOUTH 2 TIMES DAILY 03/17/21   Marcelyn Bruins, MD  losartan (COZAAR) 100 MG tablet TAKE ONE-HALF TABLET BY MOUTH AT BEDTIME 10/02/23   Glendale Chard, DO  TEGRETOL 200 MG tablet TAKE 1 AND 1/2 TABLETS BY MOUTH  TWICE DAILY IN THE MORNING AND  AT BEDTIME 08/07/23   Penumalli, Glenford Bayley, MD  topiramate (TOPAMAX) 25 MG tablet TAKE 3 TABLETS BY MOUTH TWICE  DAILY 08/16/23   Ihor Austin, NP    Family History Family History  Problem Relation Age of Onset   Migraines Mother    Cancer Mother        liver cancer   Diabetes Maternal Aunt    Hypertension Maternal Grandmother    Colon cancer Maternal Grandfather 60   Prostate cancer Maternal Grandfather    Throat cancer Maternal Grandfather     Social History Social History   Tobacco Use   Smoking status: Never   Smokeless tobacco: Never  Vaping Use   Vaping status: Never Used  Substance Use Topics   Alcohol use: No   Drug use: No     Allergies   Patient has no known allergies.   Review of Systems Review of Systems Per HPI  Physical Exam Triage  Vital Signs ED Triage Vitals  Encounter Vitals Group     BP 11/01/23 1211 (!) 81/58     Systolic BP Percentile --      Diastolic BP Percentile --      Pulse Rate 11/01/23 1228 83     Resp 11/01/23 1211 20     Temp 11/01/23 1211 (!) 97 F (36.1 C)     Temp Source 11/01/23 1211 Oral     SpO2 11/01/23 1228 96 %     Weight --      Height --      Head Circumference --      Peak Flow --      Pain Score 11/01/23 1209 10     Pain Loc --      Pain Education --      Exclude from Growth Chart --    No data found.  Updated Vital Signs BP (!) 83/57 (BP Location: Right Arm)   Pulse 83   Temp (!) 97 F (36.1 C) (Oral)   Resp 20   SpO2 96%   Visual  Acuity Right Eye Distance:   Left Eye Distance:   Bilateral Distance:    Right Eye Near:   Left Eye Near:    Bilateral Near:     Physical Exam Vitals and nursing note reviewed.  Constitutional:      General: She is not in acute distress.    Appearance: She is ill-appearing.  HENT:     Head: Normocephalic and atraumatic.     Right Ear: External ear normal.     Left Ear: External ear normal.  Pulmonary:     Breath sounds: Decreased air movement present.  Skin:    Capillary Refill: Capillary refill takes more than 3 seconds.     Coloration: Skin is pale. Skin is not jaundiced.     Findings: No bruising, erythema or rash.  Neurological:     Mental Status: She is easily aroused. Mental status is at baseline. She is lethargic.     Gait: Gait abnormal (wheelchair bound at baseline).      UC Treatments / Results  Labs (all labs ordered are listed, but only abnormal results are displayed) Labs Reviewed - No data to display  EKG   Radiology No results found.  Procedures Procedures (including critical care time)  Medications Ordered in UC Medications - No data to display  Initial Impression / Assessment and Plan / UC Course  I have reviewed the triage vital signs and the nursing notes.  Pertinent labs & imaging results that were available during my care of the patient were reviewed by me and considered in my medical decision making (see chart for details).   In triage, patient is hypotensive and extremities are cool to touch.  This difficult to obtain in SpO2, however after applying to the earlobe, SpO2 is 96% on room air.  Patient is also hypothermic.  1. Hypotension, unspecified hypotension type 2. Decreased appetite 3. Vomiting, unspecified vomiting type, unspecified whether nausea present Given symptoms and vital signs, I am concerned for sepsis among other life-threatening etiologies I recommended further evaluation and management in the emergency room; and is in  agreement to plan EMS called for transport to ER Patient left urgent care in stable condition  The patient was given the opportunity to ask questions.  All questions answered to their satisfaction.  The patient is in agreement to this plan.   Final Clinical Impressions(s) / UC Diagnoses   Final  diagnoses:  Hypotension, unspecified hypotension type  Decreased appetite  Vomiting, unspecified vomiting type, unspecified whether nausea present   Discharge Instructions   None    ED Prescriptions   None    PDMP not reviewed this encounter.   Valentino Nose, NP 11/01/23 1255

## 2023-11-01 NOTE — ED Triage Notes (Addendum)
 Patient arrives via [facility] for hypotension from urgent care. Low PO intake x1 week with vomiting 83 systolic. EKG unremarkable. IV fluids initiated by [facility]. Developmentally delayed, follows commands. Alert to self. Hx of strokes and seizures. At baseline. Last BP 99/63. 20 Right AC. Left side deficits with left hand contracted from previous stroke.

## 2023-11-01 NOTE — Plan of Care (Signed)
 FMTS Brief Progress Note  S: Evaluated patient at bedside for night rounds.  Patient's aunt at bedside.  States patient with no new concerns or complaints tonight.  Patient currently using bedside commode.   O: BP 92/67 (BP Location: Right Arm)   Pulse 65   Temp 97.7 F (36.5 C) (Oral)   Resp 16   Ht 5\' 3"  (1.6 m)   Wt 75.4 kg   SpO2 100%   BMI 29.45 kg/m   GEN: No acute distress, sitting on bedside commode Respiratory: Normal work of breathing on room air  A/P: Hypokalemia P.m. potassium 2.9.  Patient did not drink all of the potassium packet that was given to her earlier in the afternoon, request either pill or IV form.  Repeat EKG this afternoon unremarkable as well. - Repleted with 80 mill equivalents potassium tablets - Repeat BMP in a.m.  AKI Creatinine improved to 1.89 on p.m. BMP.  Received 1 L normal saline bolus earlier in the afternoon, and received some maintenance IV fluids in the ED. - Encourage p.o. intake - Repeat BMP in the morning  Rest of plan per day team  - Orders reviewed. Labs for AM ordered, which was adjusted as needed.   Para March, DO 11/01/2023, 10:36 PM PGY-1, [place] [facility] Night Resident  Please page [phone] with questions.

## 2023-11-01 NOTE — ED Provider Notes (Addendum)
 [place] EMERGENCY DEPARTMENT AT [facility] Provider Note   CSN: [id number] Arrival date & time: 11/01/23  1306     History  Chief Complaint  Patient presents with   Hypotension    Patricia White is a 53 y.o. female.  HPI  Presented initially to urgent care for cough and vomiting for the last 3 days.  Patient vomited 4 times today.  There is no blood in the vomit.  P.o. intake has been decreased for last week and a half.  Caregiver is her aunt who has been giving her NyQuil, TheraFlu, Alka-Seltzer cold plus without much relief.  At the urgent care, patient was found to be hypotensive at 83/57 with normal pulse and SpO2.  Patient appears to be at baseline neurologic status given history of cerebral palsy, but skin was cold and dry with prolonged capillary refill.  In the ED, patient's blood pressure had increased to 126/88.  The on denies any presence of sick contacts, congestion, fever, constipation, diarrhea, known urinary complaints.  However, patient endorses discomfort in the genital area in the interview.  Patient's on also mentions that her urine has been darker yellow.  She has been taking her blood pressure medicines without issue.     Home Medications Prior to Admission medications   Medication Sig Start Date End Date Taking? Authorizing Provider  atorvastatin (LIPITOR) 40 MG tablet TAKE 1 TABLET BY MOUTH DAILY 07/30/23   Celine Mans, MD  divalproex (DEPAKOTE) 250 MG DR tablet Take 2 tablets (500 mg total) by mouth 2 (two) times daily. **PATIENT NEEDS AN OFFICE VISIT FOR FUTURE Rx REFILLS** 08/01/23   Ihor Austin, NP  famotidine (PEPCID) 20 MG tablet Take 1 tablet (20 mg total) by mouth 2 (two) times daily for 14 days. 07/17/23 07/31/23  Celine Mans, MD  levocetirizine (XYZAL) 5 MG tablet TAKE 1 TABLET BY MOUTH 2 TIMES DAILY 03/17/21   Marcelyn Bruins, MD  losartan (COZAAR) 100 MG tablet TAKE ONE-HALF TABLET BY MOUTH AT BEDTIME 10/02/23    Glendale Chard, DO  TEGRETOL 200 MG tablet TAKE 1 AND 1/2 TABLETS BY MOUTH  TWICE DAILY IN THE MORNING AND  AT BEDTIME 08/07/23   Penumalli, Glenford Bayley, MD  topiramate (TOPAMAX) 25 MG tablet TAKE 3 TABLETS BY MOUTH TWICE  DAILY 08/16/23   Ihor Austin, NP      Allergies    Patient has no known allergies.    Review of Systems   Review of Systems  Constitutional:  Negative for fever.  HENT:  Negative for congestion.   Gastrointestinal:  Positive for vomiting. Negative for diarrhea.    Physical Exam Updated Vital Signs BP 126/88 (BP Location: Left Arm)   Pulse 72   Temp 97.6 F (36.4 C) (Oral)   Resp 16   Ht 5\' 3"  (1.6 m)   Wt 75.4 kg   SpO2 97%   BMI 29.45 kg/m  Physical Exam Exam conducted with a chaperone present.  Constitutional:      General: She is not in acute distress.    Appearance: She is not ill-appearing.  HENT:     Head: Normocephalic and atraumatic.  Eyes:     Extraocular Movements: Extraocular movements intact.     Conjunctiva/sclera: Conjunctivae normal.  Cardiovascular:     Rate and Rhythm: Normal rate and regular rhythm.     Pulses: Normal pulses.     Heart sounds: Normal heart sounds.  Pulmonary:     Effort: Pulmonary effort is normal.  No respiratory distress.     Breath sounds: Normal breath sounds.  Abdominal:     General: Abdomen is flat. Bowel sounds are normal. There is no distension.     Palpations: Abdomen is soft.     Comments: Mild suprapubic tenderness.  Genitourinary:    General: Normal vulva.     Exam position: Supine.     Pubic Area: No rash.      Labia:        Right: No rash, lesion or injury.        Left: No rash, lesion or injury.      Urethra: No prolapse, urethral swelling or urethral lesion.     Comments: Minimal residual toilet tissue present between labia. Musculoskeletal:        General: Normal range of motion.     Cervical back: Normal range of motion.     Comments: Patient has contracture of left upper extremity.   Skin:    General: Skin is dry.  Neurological:     General: No focal deficit present.     Mental Status: She is alert. Mental status is at baseline.  Psychiatric:        Behavior: Behavior normal.     ED Results / Procedures / Treatments   Labs (all labs ordered are listed, but only abnormal results are displayed) Labs Reviewed  CBC - Abnormal; Notable for the following components:      Result Value   RBC 3.22 (*)    Hemoglobin 9.9 (*)    HCT 30.2 (*)    All other components within normal limits  COMPREHENSIVE METABOLIC PANEL - Abnormal; Notable for the following components:   Potassium 2.5 (*)    Chloride 93 (*)    CO2 33 (*)    BUN 45 (*)    Creatinine, Ser 2.22 (*)    Albumin 3.2 (*)    GFR, Estimated 26 (*)    All other components within normal limits  URINALYSIS, W/ REFLEX TO CULTURE (INFECTION SUSPECTED) - Abnormal; Notable for the following components:   APPearance HAZY (*)    Ketones, ur 5 (*)    Leukocytes,Ua LARGE (*)    Bacteria, UA RARE (*)    Non Squamous Epithelial 0-5 (*)    All other components within normal limits  URINE CULTURE    EKG None  Radiology No results found.  Procedures Procedures    Medications Ordered in ED Medications  0.9 % NaCl with KCl 20 mEq/ L  infusion (has no administration in time range)    ED Course/ Medical Decision Making/ A&P                                 Medical Decision Making Amount and/or Complexity of Data Reviewed Labs: ordered.   53 year old with a history of infantile cerebral palsy, seizure disorder, hypertension, HLD, and diabetes here after experiencing hypotension at urgent care.  Vitals are stable though blood pressure has improved since being in the ED.  Exam is notable for baseline mental status and reassuring cardiopulmonary and GU exam.  Differential includes decreased p.o. intake/dehydration, medication side effect, infection, bleeding, vasovagal process, ACS.  Will begin workup with CBC,  CMP, EKG, UA. EKG reassuring without signs of acute ischemia.  CBC with hemoglobin 9.9 which is lower than previous 11.1. CMP with K 2.5 and Cr 2.2 up from previous 1.2. UA with large leukocytes, 5 ketones, and  rare bacteria.   Patient likely has viral process leading to decreased p.o. intake and vomiting, leading to decreased K and AKI. Culture is in process but likely has UTI as well given endorsed suprapubic tenderness and her stating she has pain in the genital area. Will replete potassium with NS-Kcl and call for admission, as she will need to be admitted for further fluid resuscitation.  Final Clinical Impression(s) / ED Diagnoses Final diagnoses:  Dehydration  AKI (acute kidney injury) (HCC)  Lower urinary tract infectious disease    Rx / DC Orders ED Discharge Orders     None         Evette Georges, MD 11/01/23 1521    Evette Georges, MD 11/01/23 1534    Gerhard Munch, MD 11/05/23 1529

## 2023-11-01 NOTE — H&P (Addendum)
 Hospital Admission History and Physical Service Pager: [phone]  Patient name: Patricia White Medical record number: [id number] Date of Birth: April 23, 1971 Age: 53 y.o. Gender: female  Primary Care Provider: Celine Mans, MD Consultants: none Code Status: DNR which was confirmed with family if patient unable to confirm  Preferred Emergency Contact: Aunt - [name]  Chief Complaint: Dehydration  Assessment and Plan: ZYRIA FISCUS is a 53 y.o. female presenting with decreased appetite ongoing for 1-2 weeks, as well as new onset cough and sore throat + stomach pain for 4 days. Found to be hypokalemic, dehydrated w/ AKI and UTI.  Hypokalemia, dehydration and AKI are likely 2/2 decreased PO intake over the past 1-2 weeks. Reassuringly afebrile, no leukocytosis, and benign exam aside from dry mucous membranes. Suspect viral illness + UTI as primary cause for poor PO. If patient appetite does not resolve or stomach pain worsens, we will consider further work-up. Patient appeared eager to eat now. Assessment & Plan AKI (acute kidney injury) (HCC) Likely prerenal in the setting of poor PO, dehydration.  Creatinine 2.2  (Baseline 1-1.2). Was started on mIVF with Kcl. - Admit to FMTS, attending Dr. Linwood Dibbles - D/C mIVF, give 1L bolus NS - Encourage PO and give additional fluid bolus as needed - Vital signs per floor - Regular diet  - PT/OT to treat - AM CBC, BMP Hypokalemia 2.5 on presentation.  Received IV fluids with KCl in the ED. - Replete with additional 80 mEq PO potassium - Recheck potassium this evening, replete as indicated - Repeat EKG this evening - A.m. BMP UTI (urinary tract infection) Patient denies pain with urination, but does report abdominal pain. CTX while admitted, transition to PO at D/C if necessary. - Tylenol 650 mg q6 PRN - CTX (3/20-) Oral thrush Immunocompetent, not on inhalers. Questionable oral hygiene, however no other major risk factors.  Differential includes geopgraphic tongue/oral lichen planus, but all prior exams per char review state "normal tongue." Hx of urticaria treated with Xolair, but does not appear this has been given since 2023. Given presentation above, will trial treatment for thrush. - Oral Nystatin suspension 4 times daily Nausea Will offer symptomatic support and encouraged good oral intake. - Caution with prolonged Qtc -ODT Zofran, consider benadryl or scopolamine if more control is needed Chronic health problem GERD-Pepcid 20 mg twice daily Seizures-continue home Tegretol 300 mg 3 times daily, Depakote 500 mg twice daily, Topamax 75 mg twice daily Hypertension-holding home medications given borderline pressures HLD-continue atorvastatin 40 mg daily   FEN/GI: Regular diet, encourage PO VTE Prophylaxis: Lovenox  Disposition: Med telemetry  History of Present Illness:  Patricia White is a 53 y.o. female presenting with decreased appetite over the last 2 weeks, with new onset nausea, abdominal pain and absent appetite in the last 4 days. Aunt is primary caretaker and gives most of the history.  She reports patient has also been coughing and complaining of sore throat.  Denies fever, chest pain or difficulty breathing.  No recent sick contacts.  Patient has been taking all of her medicines as prescribed and has not missed any doses.  Aunt reports patient has been able to keep down all medicines despite vomiting.  In the ED, labs show hemoglobin 9.9, hypokalemia to 2.5, Cr 2.2 (up from previous 1.2).  Urinalysis with large leukocytes, rare bacteria.  In the setting of hypokalemia, dehydration, probable UTI, and AKI patient was called for admission for additional fluid resuscitation and antibiotics.  Review  Of Systems: Per HPI.  Pertinent Past Medical History: Cerebral palsy Asthma Seizures Remainder reviewed in history tab.   Pertinent Past Surgical History: None Remainder reviewed in history tab.    Pertinent Social History: Tobacco use: No Alcohol use: No Other Substance use: No Lives with aunt, grandmother, uncle, cousin  Pertinent Family History: Mother-migraines, liver cancer Remainder reviewed in history tab.   Important Outpatient Medications: Atorvastatin 40 mg Depakote 500 mg DR BID Pepcid 20 mg BID Xyzal 5 mg BID Cozaar 50 mg QH Tegretol 300 mg BID Topamax 75 mg BID Remainder reviewed in medication history.   Objective: BP 126/88 (BP Location: Left Arm)   Pulse 72   Temp 97.6 F (36.4 C) (Oral)   Resp 16   Ht 5\' 3"  (1.6 m)   Wt 75.4 kg   SpO2 97%   BMI 29.45 kg/m  Exam: General: NAD, pleasant HEENT: Normocephalic, atraumatic head.  Dry mucous membranes.  Throat non erythematous, no exudate, no deviation.  Poor dentition.  Oropharyngeal thrush present. Respiratory: CTAB, normal wob on RA GI: Abdomen is soft, not tender, not distended. BS present Skin: Warm and dry  Labs:  CBC BMET  Recent Labs  Lab 11/01/23 1440  WBC 4.0  HGB 9.9*  HCT 30.2*  PLT 228   Recent Labs  Lab 11/01/23 1440  NA 135  K 2.5*  CL 93*  CO2 33*  BUN 45*  CREATININE 2.22*  GLUCOSE 95  CALCIUM 8.9     UA: Large leuks, rare bacteria Resp panel in process  EKG: Normal sinus, mildly prolonged QTC, no STEMI, sporadic T wave inversions in non-contigous leads, upright T -waves in inferior + lateral leads.  Cyndia Skeeters, DO 11/01/2023, 3:42 PM PGY-1, [facility]  FPTS Intern pager: [phone], text pages welcome Secure chat group [facility] Teaching Service   Upper Level Addendum: I have seen and evaluated this patient along with Dr. Mliss Sax and reviewed the above note, making necessary revisions as appropriate. I agree with the medical decision making and physical exam as noted above. Tiffany Kocher, DO PGY-2 [facility] Family Medicine Residency

## 2023-11-01 NOTE — Assessment & Plan Note (Signed)
>>  ASSESSMENT AND PLAN FOR NAUSEA  ABDOMINAL PAIN WRITTEN ON 11/01/2023  5:41 PM BY Tiffany Kocher, DO  Will offer symptomatic support and encouraged good oral intake. - Caution with prolonged Qtc -ODT Zofran, consider benadryl or scopolamine if more control is needed

## 2023-11-01 NOTE — ED Notes (Signed)
 [facility] ED Charge RN made aware of patient needing transport to ED.

## 2023-11-01 NOTE — ED Triage Notes (Addendum)
 Cough and vomiting for 3 days.  Vomited 4 episodes today.  Poor intake of po's for 1 1/2 weeks.  Patient is non-verbal or minimal responses.  Skin cold, dry.  Patient does look in direction of name being called.  Family member reports to be caregiver.    Has had nyquil, theraflu, alka seltzer cold plus with not relief from any medication  PCP has not been contacted

## 2023-11-01 NOTE — ED Notes (Signed)
 Patient is being discharged from the Urgent Care and sent to the Emergency Department via [facility]. Per Cathlean Marseilles, NP, patient is in need of higher level of care due to hypotension. Patient is aware and verbalizes understanding of plan of care.  Vitals:   11/01/23 1228 11/01/23 1231  BP:  (!) 83/57  Pulse: 83   Resp:    Temp:    SpO2: 96%

## 2023-11-01 NOTE — Assessment & Plan Note (Addendum)
 Patient denies pain with urination, but does report abdominal pain. CTX while admitted, transition to PO at D/C if necessary. - Tylenol 650 mg q6 PRN - CTX (3/20-)

## 2023-11-01 NOTE — Assessment & Plan Note (Addendum)
 2.5 on presentation.  Received IV fluids with KCl in the ED. - Replete with additional 80 mEq PO potassium - Recheck potassium this evening, replete as indicated - Repeat EKG this evening - A.m. BMP

## 2023-11-01 NOTE — Assessment & Plan Note (Addendum)
 Will offer symptomatic support and encouraged good oral intake. - Caution with prolonged Qtc -ODT Zofran, consider benadryl or scopolamine if more control is needed

## 2023-11-01 NOTE — Assessment & Plan Note (Addendum)
 Likely prerenal in the setting of poor PO, dehydration.  Creatinine 2.2  (Baseline 1-1.2). Was started on mIVF with Kcl. - Admit to FMTS, attending Dr. Linwood Dibbles - D/C mIVF, give 1L bolus NS - Encourage PO and give additional fluid bolus as needed - Vital signs per floor - Regular diet  - PT/OT to treat - AM CBC, BMP

## 2023-11-01 NOTE — Assessment & Plan Note (Signed)
 GERD-Pepcid 20 mg twice daily Seizures-continue home Tegretol 300 mg 3 times daily, Depakote 500 mg twice daily, Topamax 75 mg twice daily Hypertension-holding home medications given borderline pressures HLD-continue atorvastatin 40 mg daily

## 2023-11-02 DIAGNOSIS — Z515 Encounter for palliative care: Secondary | ICD-10-CM | POA: Diagnosis not present

## 2023-11-02 DIAGNOSIS — Z7189 Other specified counseling: Secondary | ICD-10-CM | POA: Diagnosis not present

## 2023-11-02 LAB — BASIC METABOLIC PANEL
Anion gap: 13 (ref 5–15)
BUN: 30 mg/dL — ABNORMAL HIGH (ref 6–20)
CO2: 24 mmol/L (ref 22–32)
Calcium: 9.5 mg/dL (ref 8.9–10.3)
Chloride: 102 mmol/L (ref 98–111)
Creatinine, Ser: 1.76 mg/dL — ABNORMAL HIGH (ref 0.44–1.00)
GFR, Estimated: 35 mL/min — ABNORMAL LOW (ref >60)
Glucose, Bld: 99 mg/dL (ref 70–99)
Potassium: 3.6 mmol/L (ref 3.5–5.1)
Sodium: 139 mmol/L (ref 135–145)

## 2023-11-02 LAB — URINE CULTURE

## 2023-11-02 LAB — CBC
HCT: 34.1 % — ABNORMAL LOW (ref 36.0–46.0)
Hemoglobin: 11.1 g/dL — ABNORMAL LOW (ref 12.0–15.0)
MCH: 30.3 pg (ref 26.0–34.0)
MCHC: 32.6 g/dL (ref 30.0–36.0)
MCV: 93.2 fL (ref 80.0–100.0)
Platelets: 230 10*3/uL (ref 150–400)
RBC: 3.66 MIL/uL — ABNORMAL LOW (ref 3.87–5.11)
RDW: 12.6 % (ref 11.5–15.5)
WBC: 3.4 10*3/uL — ABNORMAL LOW (ref 4.0–10.5)
nRBC: 0 % (ref 0.0–0.2)

## 2023-11-02 LAB — MAGNESIUM: Magnesium: 1.8 mg/dL (ref 1.7–2.4)

## 2023-11-02 LAB — GLUCOSE, CAPILLARY
Glucose-Capillary: 80 mg/dL (ref 70–99)
Glucose-Capillary: 77 mg/dL (ref 70–99)

## 2023-11-02 MED ORDER — SODIUM CHLORIDE 0.9 % IV SOLN
2.0000 g | INTRAVENOUS | Status: AC
  Administered 2023-11-02 – 2023-11-03 (×2): 2 g via INTRAVENOUS
  Filled 2023-11-02 (×2): qty 20

## 2023-11-02 MED ORDER — SODIUM CHLORIDE 0.9 % IV SOLN: 2.0000 g | Freq: Once | INTRAVENOUS | Status: DC

## 2023-11-02 NOTE — Plan of Care (Signed)

## 2023-11-02 NOTE — Plan of Care (Signed)
  Problem: Clinical Measurements: Goal: Ability to maintain clinical measurements within normal limits will improve Outcome: Progressing Goal: Will remain free from infection Outcome: Progressing Goal: Respiratory complications will improve Outcome: Progressing Goal: Cardiovascular complication will be avoided Outcome: Progressing   Problem: Activity: Goal: Risk for activity intolerance will decrease Outcome: Progressing   Problem: Coping: Goal: Level of anxiety will decrease Outcome: Progressing   Problem: Elimination: Goal: Will not experience complications related to bowel motility Outcome: Progressing Goal: Will not experience complications related to urinary retention Outcome: Progressing   Problem: Pain Managment: Goal: General experience of comfort will improve and/or be controlled Outcome: Progressing   Problem: Safety: Goal: Ability to remain free from injury will improve Outcome: Progressing   Problem: Skin Integrity: Goal: Risk for impaired skin integrity will decrease Outcome: Progressing

## 2023-11-02 NOTE — Progress Notes (Signed)
 SLP Cancellation Note  Patient Details Name: Patricia White MRN: [id number] DOB: 06-19-1971   Cancelled treatment:        SLP attempted to conduct bedside swallow evaluation. Upon entry, patient asleep in bed with aunt/primary care partner present at bedside. Per aunt, patient has been swallowing without difficulty, however has no appetite due to current medical status. Additionally, aunt reports episode of emesis with morning medications and n/v in the 2-3 days leading up to admission. Both SLP and aunt attempted to rouse patient. Patient briefly roused and refused PO intake and attempts at oral mechanism exam. SLP will continue attempts as able.   Jeannie Done, M.A., CCC-SLP  Patricia White 11/02/2023, 2:57 PM

## 2023-11-02 NOTE — Assessment & Plan Note (Signed)
 GERD-Pepcid 20 mg twice daily Seizures-continue home Tegretol 300 mg 3 times daily, Depakote 500 mg twice daily, Topamax 75 mg twice daily Hypertension-holding home medications given borderline pressures HLD-continue atorvastatin 40 mg daily

## 2023-11-02 NOTE — Assessment & Plan Note (Addendum)
 Patient has been repleted since arrival; most recent potassium 2.9 yesterday evening and received additional repletion after this.  Still awaiting AM BMP to evaluate now.  Repeat EKG yesterday evening was unchanged from prior. -Follow-up BMP from this morning and replete potassium as necessary - AM BMP

## 2023-11-02 NOTE — Assessment & Plan Note (Addendum)
 Awaiting a.m. labs to evaluate improvement of creatinine. - Encourage PO and give additional fluid bolus as needed - PT/OT to treat - AM CBC, BMP

## 2023-11-02 NOTE — Hospital Course (Addendum)
 Patricia White is a 53 y.o.female with a history of cerebral palsy, GERD, seizures, HTN, HLD who was admitted to the [facility] Medicine Teaching Service at [facility] for acute kidney injury. Her hospital course is detailed below:  Abdominal Pain, Nausea, Vomiting  Failure to Thrive Intermittent diffuse abdominal pain, nausea, vomiting, and decreased appetite for a few weeks prior to admission. Pt given symptomatic support with Zofran initially. CT A/P 11/03/23 unremarkable on admission other than fibroids. GI consulted on 3/24 due to persistent refusal of food/loss of appetite and abdominal pain along with choking episode while taking pills. A code blue was called during the choking episode on 3/23, no loss of pulse at that time.  Since choking episode, patient went around 14 days without p.o. intake.  GI recommended continuing IVF, Protonix, Zofran PRN, and abdominal US.  Abdominal ultrasound could not visualize the pancreas, but otherwise no abnormalities noted. EGD on 3/27 revealed moderate gastritis in the gastric body and gastric antrum, few small shallow erosions (biopsies taken/H.pylori tested), and single sessile polyp that was removed. Biopsies were negative for H.pylori and showed nonspecific reactive gastropathy. GI recommended high-dose PPI and Carafate. We also treated oral thrush with IV fluconazole in case this is what was contributing to her poor p.o. intake.  RD was consulted on 3/27, made recommendations for diet initially, however pt remained with poor PO intake. Refeeding labs were monitored for at least 3 days and electrolytes were repleted as indicated. Cortrak was placed 3/28 without difficulty. Pt began PO diet during the day and nocturnal Cortrak feeds on 4/2. ***  Acute Kidney Injury Cr 2.2 on admission, thought to be pre-renal secondary to poor PO intake. Pt initially received IVF. By the time of discharge, this resolved.  Hypokalemia Potassium 2.5 on admission. Pt received IVF with  KCl initially, then several rounds of potassium supplementation. By time of discharge, potassium was within normal limits.   Urinary Tract Infection Pt complained of abdominal pain initially. U/A obtained in the ED with large leukocytes. She was treated with Ceftriaxone (3/20-3/22). A CT abdomen pelvis was obtained 3/21 due to persistent abdominal pain and an episode of vomiting, which showed uterine fibroids but no acute abnormalities. Symptoms resolved after antibiotics were completed.   Goals of Care Palliative was consulted to discuss goals of care. Pt made DNR.   Other chronic conditions were medically managed with home medications and formulary alternatives as necessary (GERD, seizures, HTN, HLD)  PCP Follow-up Recommendations: If in accordance with GOC, patient is due for colonoscopy; was due in 2021 but not obtained per chart review.

## 2023-11-02 NOTE — Assessment & Plan Note (Addendum)
 Continues to deny UTI symptoms, though does have mild abdominal pain ongoing. - Will obtain CT abdomen for further workup of abdominal pain given that patient has difficulty communicating her symptoms - Tylenol 650 mg q6 PRN - Continue ceftriaxone for 3 doses to give complete treatment for possible UTI (3/20-3/22)

## 2023-11-02 NOTE — Assessment & Plan Note (Addendum)
 Does appear somewhat improved this morning. - Oral Nystatin suspension 4 times daily

## 2023-11-02 NOTE — Evaluation (Signed)
 Occupational Therapy Evaluation Patient Details Name: Patricia White MRN: [id number] DOB: 05/31/1971 Today's Date: 11/02/2023   History of Present Illness   52yo Female ho presents to [facility] on 11/01/23 for hypotension and dehydration in setting of 3 days of cough, nausea and vomiting. Found to have AKI, hypokalemia in ED. PMH Infantile CP cared for by aunt, seizure d/o, moderate ID, L hemiparesis, HTN, seasonal allergies, T2DM, HLD, obesity     Clinical Impressions Pt admitted for above, PTA pt was independent no AD with mobility and ind in ADLs but aunt does iADLs including medication management. Pt currently presents as generally weak in her BLEs, had reported fall last night per nursing staff. Pt c/o generalized aching pain, limited today's session to standing EOB with CGA, her Bp was stable. On assessment pt needed mod A to CGA for ADLs, OT to continue following pt acutely to address listed deficits and help transition to next level of care. Patient would benefit from post acute Home OT services to help maximize functional independence in natural environment       If plan is discharge home, recommend the following:   A little help with bathing/dressing/bathroom;Assistance with cooking/housework;Supervision due to cognitive status;Direct supervision/assist for financial management;Direct supervision/assist for medications management     Functional Status Assessment   Patient has had a recent decline in their functional status and demonstrates the ability to make significant improvements in function in a reasonable and predictable amount of time.     Equipment Recommendations   None recommended by OT     Recommendations for Other Services         Precautions/Restrictions   Precautions Precautions: Fall Recall of Precautions/Restrictions: Impaired Precaution/Restrictions Comments: had a fall the night of 3/20 per AM nurse Restrictions Weight Bearing Restrictions Per  Provider Order: No     Mobility Bed Mobility Overal bed mobility: Needs Assistance Bed Mobility: Supine to Sit, Sit to Supine     Supine to sit: Mod assist Sit to supine: Contact guard assist   General bed mobility comments: Mod A to help pt situp on L side    Transfers Overall transfer level: Needs assistance Equipment used: 1 person hand held assist Transfers: Sit to/from Stand Sit to Stand: Contact guard assist           General transfer comment: slow to rise, cues for upright standing posture. deferred gait secondary to generalized pain      Balance Overall balance assessment: Mild deficits observed, not formally tested                                         ADL either performed or assessed with clinical judgement   ADL Overall ADL's : Needs assistance/impaired Eating/Feeding: Set up;Sitting Eating/Feeding Details (indicate cue type and reason): using RUE Grooming: Sitting;Contact guard assist Grooming Details (indicate cue type and reason): using RUE Upper Body Bathing: Sitting;Contact guard assist Upper Body Bathing Details (indicate cue type and reason): using RUE Lower Body Bathing: Sitting/lateral leans;Minimal assistance   Upper Body Dressing : Sitting;Moderate assistance   Lower Body Dressing: Maximal assistance Lower Body Dressing Details (indicate cue type and reason): don bilat socks Toilet Transfer: Contact guard assist;Stand-pivot   Toileting- Clothing Manipulation and Hygiene: Contact guard assist;Sitting/lateral lean               Vision         Perception  Praxis         Pertinent Vitals/Pain Pain Assessment Pain Assessment: 0-10 Pain Score: 10-Worst pain ever Pain Location: pt reporting generalized pain, she continued to state "1-10" on the pain scale when asked of her rating. Pt with developmental disabilities at baseline Pain Descriptors / Indicators: Sore Pain Intervention(s): Limited activity  within patient's tolerance, Monitored during session, Repositioned     Extremity/Trunk Assessment Upper Extremity Assessment Upper Extremity Assessment: LUE deficits/detail (RUE WFL) LUE Deficits / Details: Congential CP, L hemi.   Lower Extremity Assessment Lower Extremity Assessment: Generalized weakness;Defer to PT evaluation       Communication Communication Communication: No apparent difficulties   Cognition Arousal: Alert Behavior During Therapy: WFL for tasks assessed/performed Cognition: History of cognitive impairments             OT - Cognition Comments: 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                 Following commands: Impaired Following commands impaired: Follows one step commands with increased time     Cueing  General Comments   Cueing Techniques: Verbal cues  Pt aunt present and supportive. Bp sitting EOB 102/72 and standing >71mins 103/85. Pt c/o "some" dizziness   Exercises     Shoulder Instructions      Home Living Family/patient expects to be discharged to:: Private residence Living Arrangements: Other relatives Audiological scientist) Available Help at Discharge: Available 24 hours/day;Family Type of Home: House Home Access: Stairs to enter Secretary/administrator of Steps: 4 Entrance Stairs-Rails: None Home Layout: One level     Bathroom Shower/Tub: Chief Strategy Officer: Standard     Home Equipment: TEFL teacher Comments: fell while admitted      Prior Functioning/Environment Prior Level of Function : History of Falls (last six months)             Mobility Comments: Ind no AD ADLs Comments: ind with ADLs, Aunt does iADLs and medication management    OT Problem List: Impaired balance (sitting and/or standing);Decreased cognition;Decreased strength;Pain   OT Treatment/Interventions: Self-care/ADL training;Balance training;Therapeutic exercise;Therapeutic activities;Patient/family  education      OT Goals(Current goals can be found in the care plan section)   Acute Rehab OT Goals Patient Stated Goal: To get stronger OT Goal Formulation: With family Time For Goal Achievement: 11/16/23 Potential to Achieve Goals: Good ADL Goals Pt Will Perform Grooming: with supervision;standing Pt Will Perform Lower Body Bathing: with supervision;sit to/from stand Pt Will Perform Lower Body Dressing: with supervision;sit to/from stand Pt Will Transfer to Toilet: with supervision;ambulating Pt Will Perform Tub/Shower Transfer: Tub transfer;Shower transfer;with supervision;ambulating;shower seat   OT Frequency:  Min 2X/week    Co-evaluation              AM-PAC OT "6 Clicks" Daily Activity     Outcome Measure Help from another person eating meals?: A Little Help from another person taking care of personal grooming?: A Little Help from another person toileting, which includes using toliet, bedpan, or urinal?: A Little Help from another person bathing (including washing, rinsing, drying)?: A Little Help from another person to put on and taking off regular upper body clothing?: A Lot Help from another person to put on and taking off regular lower body clothing?: A Lot 6 Click Score: 16   End of Session Equipment Utilized During Treatment: Gait belt Nurse Communication: Mobility status  Activity Tolerance: Patient tolerated treatment well Patient left: in bed;with call  bell/phone within reach;with bed alarm set;with family/visitor present  OT Visit Diagnosis: Unsteadiness on feet (R26.81);History of falling (Z91.81);Muscle weakness (generalized) (M62.81)                Time: 1726-1750 OT Time Calculation (min): 24 min Charges:  OT General Charges $OT Visit: 1 Visit OT Evaluation $OT Eval Low Complexity: 1 Low OT Treatments $Therapeutic Activity: 8-22 mins  11/02/2023  AB, OTR/L  Acute Rehabilitation Services  Office: [phone]   Tristan Schroeder 11/02/2023, 6:55 PM

## 2023-11-02 NOTE — Assessment & Plan Note (Signed)
>>  ASSESSMENT AND PLAN FOR NAUSEA  ABDOMINAL PAIN WRITTEN ON 11/02/2023 11:43 AM BY SPENCE, SARAH, DO  Will continue to offer symptomatic support and encouraged good oral intake. - Caution with prolonged Qtc -ODT Zofran, consider benadryl or scopolamine if more control is needed

## 2023-11-02 NOTE — Care Management Obs Status (Signed)
 MEDICARE OBSERVATION STATUS NOTIFICATION   Patient Details  Name: Patricia White MRN: [id number] Date of Birth: 1970/09/03   Medicare Observation Status Notification Given:  Yes    Kermit Balo, RN 11/02/2023, 3:50 PM

## 2023-11-02 NOTE — TOC Initial Note (Signed)
 Transition of Care [facility]) - Initial/Assessment Note    Patient Details  Name: Patricia White MRN: [id number] Date of Birth: [birth year]  Transition of Care [facility]) CM/SW Contact:    Kermit Balo, RN Phone Number: 11/02/2023, 3:53 PM  Clinical Narrative:                  Pt is from home with her aunt. Her aunt provides needed transportation and manages her medications.  [facility] will transport her home when medically ready.  TOC following.  Expected Discharge Plan: Home/Self Care Barriers to Discharge: Continued Medical Work up   Patient Goals and CMS Choice            Expected Discharge Plan and Services       Living arrangements for the past 2 months: Single Family Home                                      Prior Living Arrangements/Services Living arrangements for the past 2 months: Single Family Home Lives with:: Other (Comment) Midwife) Patient language and need for interpreter reviewed:: Yes          Care giver support system in place?: Yes (comment) Current home services: DME (shower seat) Criminal Activity/Legal Involvement Pertinent to Current Situation/Hospitalization: No - Comment as needed  Activities of Daily Living   ADL Screening (condition at time of admission) Independently performs ADLs?: Yes (appropriate for developmental age) Is the patient deaf or have difficulty hearing?: No Does the patient have difficulty seeing, even when wearing glasses/contacts?: No Does the patient have difficulty concentrating, remembering, or making decisions?: No  Permission Sought/Granted                  Emotional Assessment Appearance:: Appears stated age   Affect (typically observed): Quiet     Psych Involvement: No (comment)  Admission diagnosis:  Dehydration [E86.0] Lower urinary tract infectious disease [N39.0] AKI (acute kidney injury) (HCC) [N17.9] Patient Active Problem List   Diagnosis Date Noted   AKI (acute kidney injury) (HCC)  11/01/2023   UTI (urinary tract infection) 11/01/2023   Chronic health problem 11/01/2023   Oral thrush 11/01/2023   Nausea 11/01/2023   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   Hot flashes due to menopause 06/30/2021   Hyperlipidemia associated with type 2 diabetes mellitus (HCC) 11/24/2020   Diabetes mellitus without complication (HCC) 11/23/2020   Hypokalemia 11/23/2020   Angioedema 12/10/2019   Lesion of mandible 12/10/2019   Seasonal allergies 11/20/2019   Essential hypertension 10/23/2019   Seizure disorder (HCC) 02/23/2016   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 [place] [address] Phone: [phone] Fax: [phone]  [facility] - [place], [place] - [address] [place]  [address] Phone: [phone] Fax: [phone]  [id number] - [place], [place] - [address] [place] [address] Phone: [phone] Fax: [phone]     Social Drivers of Health (SDOH) Social History: SDOH Screenings   Food Insecurity: No Food Insecurity (11/01/2023)  Housing: Low Risk  (11/01/2023)  Transportation Needs: No Transportation Needs (11/01/2023)  Utilities: Not At Risk (11/01/2023)  Alcohol Screen: Low Risk  (03/24/2023)  Depression (PHQ2-9): Low Risk  (08/09/2023)  Financial Resource Strain: Low Risk  (03/24/2023)  Physical Activity: Insufficiently Active (03/24/2023)  Social Connections: Moderately Isolated (03/24/2023)  Stress: No Stress Concern Present (03/24/2023)  Tobacco Use: Low Risk  (11/01/2023)  Health Literacy: Adequate Health Literacy (03/24/2023)   SDOH Interventions:     Readmission Risk Interventions     No data to  display

## 2023-11-02 NOTE — Progress Notes (Addendum)
 Daily Progress Note Intern Pager: [phone]  Patient name: Patricia White Medical record number: [id number] Date of birth: 1970-12-26 Age: 53 y.o. Gender: female  Primary Care Provider: Celine Mans, MD Consultants: None Code Status: DNR  Pt Overview and Major Events to Date:  3/20-admitted  Assessment and Plan: Patricia White is a 53 year old female with PMH of cerebral palsy and dependent on her Aunt for help with her care.  Presented with new onset cough and sore throat with stomach pain in the setting of 2 weeks of decreasing appetite.  Reports she is hungry, but has not eaten or drank much according to aunt.  Assessment & Plan AKI (acute kidney injury) (HCC) Awaiting a.m. labs to evaluate improvement of creatinine. - Encourage PO and give additional fluid bolus as needed - PT/OT to treat - AM CBC, BMP Hypokalemia Patient has been repleted since arrival; most recent potassium 2.9 yesterday evening and received additional repletion after this.  Still awaiting AM BMP to evaluate now.  Repeat EKG yesterday evening was unchanged from prior. -Follow-up BMP from this morning and replete potassium as necessary - AM BMP UTI (urinary tract infection) Continues to deny UTI symptoms, though does have mild abdominal pain ongoing. - Will obtain CT abdomen for further workup of abdominal pain given that patient has difficulty communicating her symptoms - Tylenol 650 mg q6 PRN - Continue ceftriaxone for 3 doses to give complete treatment for possible UTI (3/20-3/22) Oral thrush Does appear somewhat improved this morning. - Oral Nystatin suspension 4 times daily Nausea Will continue to offer symptomatic support and encouraged good oral intake. - Caution with prolonged Qtc -ODT Zofran, consider benadryl or scopolamine if more control is needed Chronic health problem GERD-Pepcid 20 mg twice daily Seizures-continue home Tegretol 300 mg 3 times daily, Depakote 500 mg twice daily,  Topamax 75 mg twice daily Hypertension-holding home medications given borderline pressures HLD-continue atorvastatin 40 mg daily  FEN/GI: Regular diet PPx: Lovenox Dispo:Home pending clinical improvement . Barriers include good p.o. intake.   Subjective:  Patient seen this morning sleeping in bed, though she awakens easily to verbal stimuli.  She would like to eat a hot dog.  Reports some abdominal pain.  Otherwise no concerns.  Aunt reports that she did sip on some drinks overnight, but did not have an appetite.  Objective: Temp:  [97 F (36.1 C)-98.5 F (36.9 C)] 98 F (36.7 C) (03/21 0727) Pulse Rate:  [65-83] 67 (03/21 0727) Resp:  [15-20] 16 (03/21 0727) BP: (81-126)/(49-88) 102/72 (03/21 0727) SpO2:  [96 %-100 %] 100 % (03/21 0727) Weight:  [75.4 kg] 75.4 kg (03/20 1315) Physical Exam: General: Tired appearing, interactive, NAD Cardiovascular: RRR, no murmurs Respiratory: CTA bilaterally on room air Abdomen: Normoactive bowel sounds, soft, mildly tender to palpation diffusely.  No masses. Extremities: Moves all equally.  Laboratory: Most recent CBC Lab Results  Component Value Date   WBC 4.0 11/01/2023   HGB 9.9 (L) 11/01/2023   HCT 30.2 (L) 11/01/2023   MCV 93.8 11/01/2023   PLT 228 11/01/2023   Most recent BMP    Latest Ref Rng & Units 11/01/2023    8:06 PM  BMP  Glucose 70 - 99 mg/dL 469   BUN 6 - 20 mg/dL 40   Creatinine 6.29 - 1.00 mg/dL 5.28   Sodium 413 - 244 mmol/L 136   Potassium 3.5 - 5.1 mmol/L 2.9   Chloride 98 - 111 mmol/L 96   CO2 22 - 32 mmol/L 26  Calcium 8.9 - 10.3 mg/dL 8.8    Repeat EKG yesterday evening consistent with EKG on arrival.  No new concerns.  Cyndia Skeeters, DO 11/02/2023, 7:30 AM  PGY-1, [facility] Health Family Medicine FPTS Intern pager: [phone], text pages welcome Secure chat group [facility] Teaching Service

## 2023-11-02 NOTE — Assessment & Plan Note (Addendum)
 Will continue to offer symptomatic support and encouraged good oral intake. - Caution with prolonged Qtc -ODT Zofran, consider benadryl or scopolamine if more control is needed

## 2023-11-03 ENCOUNTER — Observation Stay (HOSPITAL_COMMUNITY)

## 2023-11-03 DIAGNOSIS — K4020 Bilateral inguinal hernia, without obstruction or gangrene, not specified as recurrent: Secondary | ICD-10-CM | POA: Diagnosis not present

## 2023-11-03 DIAGNOSIS — R112 Nausea with vomiting, unspecified: Secondary | ICD-10-CM | POA: Diagnosis not present

## 2023-11-03 DIAGNOSIS — Z7189 Other specified counseling: Secondary | ICD-10-CM | POA: Diagnosis not present

## 2023-11-03 DIAGNOSIS — Z515 Encounter for palliative care: Secondary | ICD-10-CM | POA: Diagnosis not present

## 2023-11-03 DIAGNOSIS — R109 Unspecified abdominal pain: Secondary | ICD-10-CM | POA: Diagnosis not present

## 2023-11-03 LAB — BASIC METABOLIC PANEL
Anion gap: 11 (ref 5–15)
BUN: 19 mg/dL (ref 6–20)
CO2: 24 mmol/L (ref 22–32)
Calcium: 9 mg/dL (ref 8.9–10.3)
Chloride: 104 mmol/L (ref 98–111)
Creatinine, Ser: 1.62 mg/dL — ABNORMAL HIGH (ref 0.44–1.00)
GFR, Estimated: 38 mL/min — ABNORMAL LOW (ref >60)
Glucose, Bld: 84 mg/dL (ref 70–99)
Potassium: 3 mmol/L — ABNORMAL LOW (ref 3.5–5.1)
Sodium: 139 mmol/L (ref 135–145)

## 2023-11-03 MED ORDER — IOHEXOL 350 MG/ML SOLN
75.0000 mL | Freq: Once | INTRAVENOUS | Status: AC | PRN
  Administered 2023-11-03: 75 mL via INTRAVENOUS

## 2023-11-03 MED ORDER — ENOXAPARIN SODIUM 40 MG/0.4ML IJ SOSY
40.0000 mg | PREFILLED_SYRINGE | INTRAMUSCULAR | Status: DC
  Administered 2023-11-03: 40 mg via SUBCUTANEOUS
  Filled 2023-11-03 (×2): qty 0.4

## 2023-11-03 MED ORDER — LACTATED RINGERS IV BOLUS
1000.0000 mL | Freq: Once | INTRAVENOUS | Status: AC
  Administered 2023-11-03: 1000 mL via INTRAVENOUS

## 2023-11-03 MED ORDER — POLYETHYLENE GLYCOL 3350 17 G PO PACK
17.0000 g | PACK | Freq: Every day | ORAL | Status: DC
  Administered 2023-11-03 – 2023-11-16 (×6): 17 g via ORAL
  Filled 2023-11-03 (×11): qty 1

## 2023-11-03 MED ORDER — SENNOSIDES-DOCUSATE SODIUM 8.6-50 MG PO TABS
1.0000 | ORAL_TABLET | Freq: Every day | ORAL | Status: DC
  Administered 2023-11-03 – 2023-11-15 (×9): 1 via ORAL
  Filled 2023-11-03 (×11): qty 1

## 2023-11-03 MED ORDER — POTASSIUM CHLORIDE CRYS ER 20 MEQ PO TBCR
80.0000 meq | EXTENDED_RELEASE_TABLET | Freq: Once | ORAL | Status: AC
  Administered 2023-11-03: 80 meq via ORAL
  Filled 2023-11-03: qty 4

## 2023-11-03 MED ORDER — LORAZEPAM 2 MG/ML IJ SOLN
1.0000 mg | Freq: Once | INTRAMUSCULAR | Status: AC
  Administered 2023-11-03: 1 mg via INTRAVENOUS
  Filled 2023-11-03: qty 1

## 2023-11-03 NOTE — Plan of Care (Signed)

## 2023-11-03 NOTE — Progress Notes (Signed)
 Daily Progress Note Intern Pager: [phone]  Patient name: Patricia White Medical record number: [id number] Date of birth: [birth year] Age: 53 y.o. Gender: female  Primary Care Provider: Celine Mans, MD Consultants: None Code Status: DNR  Pt Overview and Major Events to Date:  3/20-admitted  Assessment and Plan: Patricia White is a 53 year old female who presented with AKI, hypokalemia, suspected UTI.  Anticipate that SNF would be the most appropriate disposition for this patient.  Assessment & Plan AKI (acute kidney injury) (HCC) Creatinine continues to slowly improve.  1.62 this AM. -Continue to encourage PO and give additional fluid bolus as needed - PT/OT to treat - AM CBC, BMP Hypokalemia AM K 3.0. -Replete this morning with 80 mEq potassium -Continue to trend with AM BMP UTI (urinary tract infection) On last day of antibiotics.  Urine culture returned with multiple species present and suggestion for recollection.  Doubtful that this is indicated at this time. - Will obtain CT abdomen for further workup of abdominal pain given that patient has difficulty communicating her symptoms - Tylenol 650 mg q6 PRN - Continue ceftriaxone for 3 doses to give complete treatment for possible UTI (3/20-3/22) Oral thrush - Oral Nystatin suspension 4 times daily Nausea Will continue to offer symptomatic support and encouraged good oral intake.  Not eating currently, unclear if this is due to abdominal pain, nausea, some other cause. - Caution with prolonged Qtc -ODT Zofran, consider benadryl or scopolamine if more control is needed Chronic health problem GERD-Pepcid 20 mg twice daily Seizures-continue home Tegretol 300 mg 3 times daily, Depakote 500 mg twice daily, Topamax 75 mg twice daily Hypertension-holding home medications given borderline pressures HLD-continue atorvastatin 40 mg daily  FEN/GI: Soft/bland diet PPx: Lovenox Dispo:Pending PT recommendations  pending  clinical improvement . Barriers include clinical stability.   Subjective:  Patient seen this morning lying in bed sleeping with aunt at bedside.  Patient very briefly awakens to exam, but will not answer any questions.  Aunt provides information.  Reports patient will not eat anything, has only been taking sips of drinks.  Continues to complain of abdominal pain.  Also initiated brief discussion of possible SNF as well as making sure there is a good plan in place for the patient's future care given that her current caregivers are getting older notably to be able to keep her at home.  Aunt is very receptive and would like to continue this conversation with social work.  Objective: Temp:  [97.5 F (36.4 C)-98 F (36.7 C)] 97.9 F (36.6 C) (03/22 1026) Pulse Rate:  [64-94] 94 (03/22 1026) Resp:  [16-19] 16 (03/22 1026) BP: (90-98)/(62-73) 98/73 (03/22 1026) SpO2:  [84 %-100 %] 100 % (03/22 1026) Physical Exam: General: Tired appearing, lying in bed asleep, NAD Cardiovascular: RRR, no murmurs Respiratory: CTA bilaterally, though difficult to do thorough lung exam on her side not willing to roll over. Abdomen: Normoactive bowel sounds, soft, tender to palpation diffusely Extremities: Moves all equally when I try to rouse her  Laboratory: Most recent CBC Lab Results  Component Value Date   WBC 3.4 (L) 11/02/2023   HGB 11.1 (L) 11/02/2023   HCT 34.1 (L) 11/02/2023   MCV 93.2 11/02/2023   PLT 230 11/02/2023   Most recent BMP    Latest Ref Rng & Units 11/03/2023    4:23 AM  BMP  Glucose 70 - 99 mg/dL 84   BUN 6 - 20 mg/dL 19   Creatinine 6.57 - 1.00 mg/dL  1.62   Sodium 135 - 145 mmol/L 139   Potassium 3.5 - 5.1 mmol/L 3.0   Chloride 98 - 111 mmol/L 104   CO2 22 - 32 mmol/L 24   Calcium 8.9 - 10.3 mg/dL 9.0      Patricia Skeeters, DO 11/03/2023, 11:38 AM  PGY-1, [facility] Health Family Medicine FPTS Intern pager: [phone], text pages welcome Secure chat group [facility] Teaching Service

## 2023-11-03 NOTE — Assessment & Plan Note (Addendum)
 Will continue to offer symptomatic support and encouraged good oral intake.  Not eating currently, unclear if this is due to abdominal pain, nausea, some other cause. - Caution with prolonged Qtc -ODT Zofran, consider benadryl or scopolamine if more control is needed

## 2023-11-03 NOTE — Assessment & Plan Note (Addendum)
-   Oral Nystatin suspension 4 times daily

## 2023-11-03 NOTE — Evaluation (Signed)
 Physical Therapy Evaluation Patient Details Name: Patricia White MRN: [id number] DOB: [birth year] Today's Date: 11/03/2023  History of Present Illness  52yo Female ho presents to [facility] on 11/01/23 for hypotension and dehydration in setting of 3 days of cough, nausea and vomiting. Found to have AKI, hypokalemia in ED. PMH Infantile CP cared for by aunt, seizure d/o, moderate ID, L hemiparesis, HTN, seasonal allergies, T2DM, HLD, obesity  Clinical Impression  Pt presents with admitting diagnosis above. Pt today was able to perform bed mobility with CGA/supervision and stand with CGA HHA however required Min A to sidesteps. PTA pt aunt reports that she was very independent able to ambulate on her despite leg length discrepancy. Recommend HHPT upon DC. PT will continue to follow.         If plan is discharge home, recommend the following: A little help with walking and/or transfers;A little help with bathing/dressing/bathroom;Assistance with cooking/housework;Assistance with feeding;Direct supervision/assist for medications management;Assist for transportation;Help with stairs or ramp for entrance;Supervision due to cognitive status   Can travel by private vehicle        Equipment Recommendations None recommended by PT  Recommendations for Other Services       Functional Status Assessment Patient has had a recent decline in their functional status and demonstrates the ability to make significant improvements in function in a reasonable and predictable amount of time.     Precautions / Restrictions Precautions Precautions: Fall Recall of Precautions/Restrictions: Impaired Precaution/Restrictions Comments: had a fall the night of 3/20 per AM nurse Restrictions Weight Bearing Restrictions Per Provider Order: No      Mobility  Bed Mobility Overal bed mobility: Needs Assistance Bed Mobility: Supine to Sit, Sit to Supine     Supine to sit: Contact guard Sit to supine: Supervision    General bed mobility comments: Increased time however able to perform on her own.    Transfers Overall transfer level: Needs assistance Equipment used: 1 person hand held assist Transfers: Sit to/from Stand Sit to Stand: Contact guard assist           General transfer comment: slow to rise, cues for upright standing posture. pt declined gait due to fatigue.    Ambulation/Gait                  Stairs            Wheelchair Mobility     Tilt Bed    Modified Rankin (Stroke Patients Only)       Balance Overall balance assessment: Mild deficits observed, not formally tested                                           Pertinent Vitals/Pain Pain Assessment Pain Assessment: Faces Faces Pain Scale: No hurt    Home Living Family/patient expects to be discharged to:: Private residence Living Arrangements: Other relatives Available Help at Discharge: Available 24 hours/day;Family Type of Home: House Home Access: Stairs to enter Entrance Stairs-Rails: None Entrance Stairs-Number of Steps: 4   Home Layout: One level Home Equipment: Shower seat Additional Comments: fell while admitted    Prior Function Prior Level of Function : History of Falls (last six months)             Mobility Comments: Ind no AD ADLs Comments: ind with ADLs, Aunt does iADLs and medication management     Extremity/Trunk Assessment  Upper Extremity Assessment Upper Extremity Assessment: LUE deficits/detail LUE Deficits / Details: Congential CP, L hemi.    Lower Extremity Assessment Lower Extremity Assessment: LLE deficits/detail LLE Deficits / Details: Leg length discrepancy at baseline    Cervical / Trunk Assessment Cervical / Trunk Assessment: Normal  Communication   Communication Communication: No apparent difficulties    Cognition Arousal: Alert Behavior During Therapy: WFL for tasks assessed/performed   PT - Cognitive impairments: History  of cognitive impairments                       PT - Cognition Comments: Development disability at baseline. Following commands: Impaired Following commands impaired: Follows one step commands with increased time     Cueing Cueing Techniques: Verbal cues     General Comments General comments (skin integrity, edema, etc.): Pt aunt was present and is current caretaker.    Exercises     Assessment/Plan    PT Assessment Patient needs continued PT services  PT Problem List Decreased strength;Decreased range of motion;Decreased activity tolerance;Decreased balance;Decreased mobility;Decreased coordination;Decreased cognition;Decreased knowledge of use of DME;Decreased safety awareness;Decreased knowledge of precautions       PT Treatment Interventions DME instruction;Gait training;Stair training;Functional mobility training;Therapeutic activities;Therapeutic exercise;Balance training;Neuromuscular re-education;Cognitive remediation;Patient/family education    PT Goals (Current goals can be found in the Care Plan section)  Acute Rehab PT Goals Patient Stated Goal: to go home PT Goal Formulation: With patient/family Time For Goal Achievement: 11/17/23 Potential to Achieve Goals: Good    Frequency Min 3X/week     Co-evaluation               AM-PAC PT "6 Clicks" Mobility  Outcome Measure Help needed turning from your back to your side while in a flat bed without using bedrails?: A Little Help needed moving from lying on your back to sitting on the side of a flat bed without using bedrails?: A Little Help needed moving to and from a bed to a chair (including a wheelchair)?: A Little Help needed standing up from a chair using your arms (e.g., wheelchair or bedside chair)?: A Little Help needed to walk in hospital room?: A Lot Help needed climbing 3-5 steps with a railing? : A Lot 6 Click Score: 16    End of Session Equipment Utilized During Treatment: Gait  belt Activity Tolerance: Patient tolerated treatment well Patient left: in bed;with call bell/phone within reach;with bed alarm set;with family/visitor present Nurse Communication: Mobility status PT Visit Diagnosis: Other abnormalities of gait and mobility (R26.89)    Time: 1610-9604 PT Time Calculation (min) (ACUTE ONLY): 12 min   Charges:   PT Evaluation $PT Eval Moderate Complexity: 1 Mod   PT General Charges $$ ACUTE PT VISIT: 1 Visit         Shela Nevin, PT, DPT Acute Rehab Services [phone]   Gladys Damme 11/03/2023, 3:03 PM

## 2023-11-03 NOTE — Assessment & Plan Note (Signed)
>>  ASSESSMENT AND PLAN FOR NAUSEA  ABDOMINAL PAIN WRITTEN ON 11/03/2023  8:59 AM BY SPENCE, SARAH, DO  Will continue to offer symptomatic support and encouraged good oral intake.  Not eating currently, unclear if this is due to abdominal pain, nausea, some other cause. - Caution with prolonged Qtc -ODT Zofran, consider benadryl or scopolamine if more control is needed

## 2023-11-03 NOTE — Assessment & Plan Note (Signed)
 Creatinine continues to slowly improve.  1.62 this AM. -Continue to encourage PO and give additional fluid bolus as needed - PT/OT to treat - AM CBC, BMP

## 2023-11-03 NOTE — Assessment & Plan Note (Addendum)
 On last day of antibiotics.  Urine culture returned with multiple species present and suggestion for recollection.  Doubtful that this is indicated at this time. - Will obtain CT abdomen for further workup of abdominal pain given that patient has difficulty communicating her symptoms - Tylenol 650 mg q6 PRN - Continue ceftriaxone for 3 doses to give complete treatment for possible UTI (3/20-3/22)

## 2023-11-03 NOTE — Assessment & Plan Note (Signed)
 AM K 3.0. -Replete this morning with 80 mEq potassium -Continue to trend with AM BMP

## 2023-11-03 NOTE — Evaluation (Signed)
 Clinical/Bedside Swallow Evaluation Patient Details  Name: Patricia White MRN: [id number] Date of Birth: October 04, 1970  Today's Date: 11/03/2023 Time: SLP Start Time (ACUTE ONLY): 7829 SLP Stop Time (ACUTE ONLY): 1015 SLP Time Calculation (min) (ACUTE ONLY): 22 min  Past Medical History:  Past Medical History:  Diagnosis Date   Asthma    Cerebral palsy (HCC) Birth   Mental retardation Birth   Seizures [facility])    Past Surgical History: History reviewed. No pertinent surgical history. HPI:  Patricia White is a 53yo F with PMH Infantile CP cared for by aunt, seizure d/o, moderate ID, L hemiparesis, HTN, seasonal allergies, T2DM, HLD, obesity who presents via [facility] from urgent care for hypotension and dehydration in setting of 3 days of cough, nausea and vomiting consistent with viral process though apparently has had decreased appetite over the past few weeks. Found to have AKI, hypokalemia in ED, admitted for further rehydration and electrolyte repletion. Per RN reports, the pt vomitted her pills yesterday after administration.    Assessment / Plan / Recommendation  Clinical Impression  Pt seen for skilled ST services for swallow evaluation. The pt is currently on a regular/thin liquid diet and was assessed with regular solids and thin liquid. The pt's OME was largely [facility], with some missing dentition and concern for poor oral hygiene. [name] assisted in communicating with the pt and answering questions. Per pt and pt's aunt reports, the pt has had no difficulty with PO intake and just does not have an appetite. The pt refused PO intake of puree and solids, but the pt was observed taking several sips of thin in isolation and with large pills given assist from nursing. The pt swallowed the pills with liquids with no s/sx of aspiration, pt did have some oral holding which did not seem to affect function and safety of her swallow. Safest diet rec remains regular/thin liquid with general swallow  precautions with assist for meals and consistent verbal encouragement to promote PO intake and STRICT oral care BID. Given the reports of emesis following PO intake, pt may benefit from GI referral. No further acute speech needs at this time, SLP signing off.   SLP Visit Diagnosis: Dysphagia, unspecified (R13.10)    Aspiration Risk  Risk for inadequate nutrition/hydration    Diet Recommendation Regular;Thin liquid    Liquid Administration via: Straw Medication Administration: Whole meds with liquid Supervision: Staff to assist with self feeding;Full supervision/cueing for compensatory strategies Compensations: Minimize environmental distractions;Small sips/bites Postural Changes: Seated upright at 90 degrees;Remain upright for at least 30 minutes after po intake    Other  Recommendations Recommended Consults: Consider GI evaluation;Consider esophageal assessment Oral Care Recommendations: Oral care BID    Recommendations for follow up therapy are one component of a multi-disciplinary discharge planning process, led by the attending physician.  Recommendations may be updated based on patient status, additional functional criteria and insurance authorization.  Follow up Recommendations No SLP follow up      Assistance Recommended at Discharge    Functional Status Assessment Patient has had a recent decline in their functional status and demonstrates the ability to make significant improvements in function in a reasonable and predictable amount of time.  Frequency and Duration            Prognosis Prognosis for improved oropharyngeal function: Good      Swallow Study   General Date of Onset: 11/01/23 HPI: Patricia White is a 53yo F with PMH Infantile CP cared for by  aunt, seizure d/o, moderate ID, L hemiparesis, HTN, seasonal allergies, T2DM, HLD, obesity who presents via [facility] from urgent care for hypotension and dehydration in setting of 3 days of cough, nausea and vomiting  consistent with viral process though apparently has had decreased appetite over the past few weeks. Found to have AKI, hypokalemia in ED, admitted for further rehydration and electrolyte repletion. Per RN reports, the pt vomitted her pills yesterday after administration. Type of Study: Bedside Swallow Evaluation Previous Swallow Assessment: n/a Diet Prior to this Study: Regular;Thin liquids (Level 0) Temperature Spikes Noted: No Respiratory Status: Room air History of Recent Intubation: No Behavior/Cognition: Confused;Uncooperative;Requires cueing Oral Cavity Assessment: Other (comment) (signs of oral thrush) Oral Care Completed by SLP: No Oral Cavity - Dentition: Missing dentition;Poor condition Vision: Functional for self-feeding Self-Feeding Abilities: Needs assist;Other (Comment) (Pt able to do some PO intake of meds and thin liquid indpendentlt but largely required RN assist) Patient Positioning: Upright in bed Baseline Vocal Quality: Low vocal intensity Volitional Cough: Strong Volitional Swallow: Able to elicit    Oral/Motor/Sensory Function Overall Oral Motor/Sensory Function: Within functional limits   Ice Chips Ice chips: Not tested   Thin Liquid Thin Liquid: Within functional limits Presentation: Straw    Nectar Thick     Honey Thick     Puree     Solid     Solid: Not tested     Dione Housekeeper M.S. CCC-SLP

## 2023-11-03 NOTE — Assessment & Plan Note (Addendum)
 GERD-Pepcid 20 mg twice daily Seizures-continue home Tegretol 300 mg 3 times daily, Depakote 500 mg twice daily, Topamax 75 mg twice daily Hypertension-holding home medications given borderline pressures HLD-continue atorvastatin 40 mg daily

## 2023-11-04 DIAGNOSIS — E43 Unspecified severe protein-calorie malnutrition: Secondary | ICD-10-CM | POA: Diagnosis present

## 2023-11-04 DIAGNOSIS — Z79899 Other long term (current) drug therapy: Secondary | ICD-10-CM | POA: Diagnosis not present

## 2023-11-04 DIAGNOSIS — R627 Adult failure to thrive: Secondary | ICD-10-CM | POA: Diagnosis present

## 2023-11-04 DIAGNOSIS — F79 Unspecified intellectual disabilities: Secondary | ICD-10-CM | POA: Diagnosis present

## 2023-11-04 DIAGNOSIS — E878 Other disorders of electrolyte and fluid balance, not elsewhere classified: Secondary | ICD-10-CM | POA: Diagnosis not present

## 2023-11-04 DIAGNOSIS — R110 Nausea: Secondary | ICD-10-CM | POA: Diagnosis not present

## 2023-11-04 DIAGNOSIS — G40909 Epilepsy, unspecified, not intractable, without status epilepticus: Secondary | ICD-10-CM | POA: Diagnosis present

## 2023-11-04 DIAGNOSIS — N179 Acute kidney failure, unspecified: Secondary | ICD-10-CM | POA: Diagnosis present

## 2023-11-04 DIAGNOSIS — B370 Candidal stomatitis: Secondary | ICD-10-CM | POA: Diagnosis present

## 2023-11-04 DIAGNOSIS — N390 Urinary tract infection, site not specified: Secondary | ICD-10-CM | POA: Diagnosis not present

## 2023-11-04 DIAGNOSIS — Z7189 Other specified counseling: Secondary | ICD-10-CM | POA: Diagnosis not present

## 2023-11-04 DIAGNOSIS — E11649 Type 2 diabetes mellitus with hypoglycemia without coma: Secondary | ICD-10-CM | POA: Diagnosis not present

## 2023-11-04 DIAGNOSIS — E119 Type 2 diabetes mellitus without complications: Secondary | ICD-10-CM | POA: Diagnosis not present

## 2023-11-04 DIAGNOSIS — R112 Nausea with vomiting, unspecified: Secondary | ICD-10-CM | POA: Diagnosis not present

## 2023-11-04 DIAGNOSIS — Z4682 Encounter for fitting and adjustment of non-vascular catheter: Secondary | ICD-10-CM | POA: Diagnosis not present

## 2023-11-04 DIAGNOSIS — J45909 Unspecified asthma, uncomplicated: Secondary | ICD-10-CM | POA: Diagnosis present

## 2023-11-04 DIAGNOSIS — Z66 Do not resuscitate: Secondary | ICD-10-CM | POA: Diagnosis present

## 2023-11-04 DIAGNOSIS — R1084 Generalized abdominal pain: Secondary | ICD-10-CM | POA: Diagnosis not present

## 2023-11-04 DIAGNOSIS — Z6826 Body mass index (BMI) 26.0-26.9, adult: Secondary | ICD-10-CM | POA: Diagnosis not present

## 2023-11-04 DIAGNOSIS — I10 Essential (primary) hypertension: Secondary | ICD-10-CM | POA: Diagnosis present

## 2023-11-04 DIAGNOSIS — K317 Polyp of stomach and duodenum: Secondary | ICD-10-CM | POA: Diagnosis not present

## 2023-11-04 DIAGNOSIS — E860 Dehydration: Secondary | ICD-10-CM | POA: Diagnosis present

## 2023-11-04 DIAGNOSIS — R109 Unspecified abdominal pain: Secondary | ICD-10-CM | POA: Diagnosis not present

## 2023-11-04 DIAGNOSIS — E876 Hypokalemia: Secondary | ICD-10-CM | POA: Diagnosis present

## 2023-11-04 DIAGNOSIS — E785 Hyperlipidemia, unspecified: Secondary | ICD-10-CM | POA: Diagnosis present

## 2023-11-04 DIAGNOSIS — Z515 Encounter for palliative care: Secondary | ICD-10-CM | POA: Diagnosis not present

## 2023-11-04 DIAGNOSIS — K219 Gastro-esophageal reflux disease without esophagitis: Secondary | ICD-10-CM | POA: Diagnosis present

## 2023-11-04 DIAGNOSIS — D61818 Other pancytopenia: Secondary | ICD-10-CM | POA: Diagnosis not present

## 2023-11-04 DIAGNOSIS — K3189 Other diseases of stomach and duodenum: Secondary | ICD-10-CM | POA: Diagnosis present

## 2023-11-04 DIAGNOSIS — K2990 Gastroduodenitis, unspecified, without bleeding: Secondary | ICD-10-CM | POA: Diagnosis not present

## 2023-11-04 DIAGNOSIS — R630 Anorexia: Secondary | ICD-10-CM | POA: Diagnosis not present

## 2023-11-04 DIAGNOSIS — K2970 Gastritis, unspecified, without bleeding: Secondary | ICD-10-CM | POA: Diagnosis present

## 2023-11-04 DIAGNOSIS — G809 Cerebral palsy, unspecified: Secondary | ICD-10-CM | POA: Diagnosis present

## 2023-11-04 DIAGNOSIS — K5909 Other constipation: Secondary | ICD-10-CM | POA: Diagnosis present

## 2023-11-04 LAB — BASIC METABOLIC PANEL
Anion gap: 10 (ref 5–15)
BUN: 11 mg/dL (ref 6–20)
CO2: 23 mmol/L (ref 22–32)
Calcium: 9.1 mg/dL (ref 8.9–10.3)
Chloride: 108 mmol/L (ref 98–111)
Creatinine, Ser: 1.39 mg/dL — ABNORMAL HIGH (ref 0.44–1.00)
GFR, Estimated: 46 mL/min — ABNORMAL LOW (ref >60)
Glucose, Bld: 89 mg/dL (ref 70–99)
Potassium: 3.9 mmol/L (ref 3.5–5.1)
Sodium: 141 mmol/L (ref 135–145)

## 2023-11-04 LAB — MAGNESIUM: Magnesium: 1.4 mg/dL — ABNORMAL LOW (ref 1.7–2.4)

## 2023-11-04 LAB — GLUCOSE, CAPILLARY
Glucose-Capillary: 96 mg/dL (ref 70–99)
Glucose-Capillary: 70 mg/dL (ref 70–99)
Glucose-Capillary: 67 mg/dL — ABNORMAL LOW (ref 70–99)

## 2023-11-04 MED ORDER — MAGNESIUM SULFATE 2 GM/50ML IV SOLN
2.0000 g | Freq: Once | INTRAVENOUS | Status: AC
Start: 2023-11-04 — End: 2023-11-04
  Administered 2023-11-04: 2 g via INTRAVENOUS
  Filled 2023-11-04: qty 50

## 2023-11-04 MED ORDER — PANTOPRAZOLE SODIUM 40 MG PO TBEC
40.0000 mg | DELAYED_RELEASE_TABLET | Freq: Every day | ORAL | Status: DC
  Filled 2023-11-04 (×3): qty 1

## 2023-11-04 MED ORDER — LACTATED RINGERS IV SOLN: INTRAVENOUS | Status: DC

## 2023-11-04 MED ORDER — DEXTROSE IN LACTATED RINGERS 5 % IV SOLN: INTRAVENOUS | Status: AC

## 2023-11-04 NOTE — Assessment & Plan Note (Addendum)
 S/p ceftriaxone 3/20-3/22.  Abdominal CT unremarkable aside from uterine fibroids.  - Tylenol 650 mg q6 PRN - Zofran as needed

## 2023-11-04 NOTE — Plan of Care (Signed)
 Consult to gastroenterology.  Okay for clear liquid diet.  They will see tomorrow.

## 2023-11-04 NOTE — Assessment & Plan Note (Signed)
>>  ASSESSMENT AND PLAN FOR NAUSEA  ABDOMINAL PAIN WRITTEN ON 11/04/2023  6:37 AM BY MAHMOOD, ATIF, MD  Will continue to offer symptomatic support and encouraged good oral intake.  Not eating currently, unclear if this is due to abdominal pain, nausea, some other cause. - Caution with QTc prolonging agents - start protonix 40mg  daily - consider GI consult if no improvement -ODT Zofran, consider benadryl or scopolamine if more control is needed

## 2023-11-04 NOTE — Assessment & Plan Note (Addendum)
 Creatinine improving -Continue to encourage PO and give additional fluid bolus as needed. Consider continuous IV fluids if not taking PO - PT/OT  - AM CBC, BMP

## 2023-11-04 NOTE — Progress Notes (Signed)
 Initiated code blue d/t pt choking on large pills, couldn't vomit them up. Pt coughed up pills right before code team arrival, LOC decreased and exhibited aginal breathing.  Oxygen applied via mask applied.  Pads applied, checked cardiac rhythm.  Sinus tachycardia.  Elevated B/P.Marland Kitchen  Pt became more alert and started/upset by all the people in room, working on her.  Physicians at bedside and verbally initiated NPO status order.  Legal guardian at bedside during incidence.  Guardian reassured that pt is now stable and that we will continue to monitor the pt.

## 2023-11-04 NOTE — Assessment & Plan Note (Addendum)
-   Oral Nystatin suspension 4 times daily

## 2023-11-04 NOTE — Assessment & Plan Note (Addendum)
 AM K 3.9. -Continue to trend with AM BMP

## 2023-11-04 NOTE — Plan of Care (Signed)
 S: Received page for Rapid Response 5C04. Went to evaluate patient at bedside with Dr. Sharion Dove.  Upon arrival, rapid response nurse and nursing staff present.  Received report from RN, patient had choked on Depakote pill-successfully dislodged prior to our arrival.  Patient was startled, but breathing appropriately and at her baseline.  Per RN, patient has waxing and waning levels of cognition.  Typically she can take her pills, but during her attempts today patient refused to swallow with water and that led to checking event.  SLP had previously evaluated.  Legal guardian at bedside, unfortunately poor historian.  But after discussion, patient was at her baseline prior to this event.  O: General: Patient startled, laying in bed Respiratory: Normal work of breathing on room air  A/P: Given patient's CP/seizure disorder and continued poor oral intake now with episode of choking, we will make patient NPO.  As patient cannot adequately communicate all of her needs, I do think a GI consult for possible endoscopy (if legal guardian agreeable) would be reasonable. Given patient was at baseline, we have a very low suspicion that this was a neurologic event that caused choking, particular with patient being back at her baseline after choking episode.  - N.p.o. - Start mIVF - GI consult - Add diet when able, will likely need crushed meds out of precaution given difficulty following directions

## 2023-11-04 NOTE — Progress Notes (Signed)
     Daily Progress Note Intern Pager: [phone]  Patient name: Patricia White Medical record number: [id number] Date of birth: 02-May-1971 Age: 53 y.o. Gender: female  Primary Care Provider: Celine Mans, MD Consultants: N/A Code Status: DNR  Pt Overview and Major Events to Date:  3/20 admitted  Assessment and Plan:  Patricia White is a 53 y.o. female presenting with AKI, hypokalemia, suspected UTI. Pertinent PMH/PSH includes cerebral palsy.  Assessment & Plan AKI (acute kidney injury) (HCC) Creatinine improving -Continue to encourage PO and give additional fluid bolus as needed. Consider continuous IV fluids if not taking PO - PT/OT  - AM CBC, BMP Hypokalemia AM K 3.9. -Continue to trend with AM BMP Abdominal pain  UTI S/p ceftriaxone 3/20-3/22.  Abdominal CT unremarkable aside from uterine fibroids.  - Tylenol 650 mg q6 PRN - Zofran as needed  Oral thrush - Oral Nystatin suspension 4 times daily Nausea Will continue to offer symptomatic support and encouraged good oral intake.  Not eating currently, unclear if this is due to abdominal pain, nausea, some other cause. - Caution with QTc prolonging agents - start protonix 40mg  daily - consider GI consult if no improvement -ODT Zofran, consider benadryl or scopolamine if more control is needed Chronic health problem GERD-Pepcid 20 mg twice daily Seizures-continue home Tegretol 300 mg 3 times daily, Depakote 500 mg twice daily, Topamax 75 mg twice daily Hypertension-holding home medications given borderline pressures HLD-continue atorvastatin 40 mg daily    FEN/GI: Soft diet PPx: Lovenox Dispo:Pending PT recommendations  pending clinical improvement .    Subjective:  NAEON, asleep, aunt at bedside reports patient has still not been eating anything. Has not been communicating abdominal pain or nausea  Objective: Temp:  [97.3 F (36.3 C)-97.9 F (36.6 C)] 97.7 F (36.5 C) (03/23 0439) Pulse Rate:  [68-94]  78 (03/23 0439) Resp:  [16-18] 18 (03/23 0439) BP: (82-112)/(45-81) 105/81 (03/23 0439) SpO2:  [100 %] 100 % (03/23 0439) Physical Exam: General: NAD, asleep Cardiovascular: RRR no murmurs Respiratory: CTAB on RA Abdomen: Soft, nondistended Extremities: No significant edema  Laboratory: Most recent CBC Lab Results  Component Value Date   WBC 3.4 (L) 11/02/2023   HGB 11.1 (L) 11/02/2023   HCT 34.1 (L) 11/02/2023   MCV 93.2 11/02/2023   PLT 230 11/02/2023   Most recent BMP    Latest Ref Rng & Units 11/04/2023    4:26 AM  BMP  Glucose 70 - 99 mg/dL 89   BUN 6 - 20 mg/dL 11   Creatinine 5.28 - 1.00 mg/dL 4.13   Sodium 244 - 010 mmol/L 141   Potassium 3.5 - 5.1 mmol/L 3.9   Chloride 98 - 111 mmol/L 108   CO2 22 - 32 mmol/L 23   Calcium 8.9 - 10.3 mg/dL 9.1     Magnesium 1.4  Imaging/Diagnostic Tests:  CT Abd/Pelvis W Contrast: IMPRESSION: 1. No acute findings. 2. Fibroid uterus.    Electronically Signed   By: Leanna Battles M.D.   On: 11/03/2023 17:11   Vonna Drafts, MD 11/04/2023, 6:36 AM  PGY-2, [facility] FPTS Intern pager: [phone], text pages welcome Secure chat group [facility] Teaching Service

## 2023-11-04 NOTE — Assessment & Plan Note (Addendum)
 GERD-Pepcid 20 mg twice daily Seizures-continue home Tegretol 300 mg 3 times daily, Depakote 500 mg twice daily, Topamax 75 mg twice daily Hypertension-holding home medications given borderline pressures HLD-continue atorvastatin 40 mg daily

## 2023-11-04 NOTE — Plan of Care (Signed)
  Problem: Clinical Measurements: Goal: Will remain free from infection Outcome: Progressing Goal: Respiratory complications will improve Outcome: Progressing Goal: Cardiovascular complication will be avoided Outcome: Progressing   Problem: Coping: Goal: Level of anxiety will decrease Outcome: Progressing   Problem: Elimination: Goal: Will not experience complications related to bowel motility Outcome: Progressing Goal: Will not experience complications related to urinary retention Outcome: Progressing   Problem: Pain Managment: Goal: General experience of comfort will improve and/or be controlled Outcome: Progressing   Problem: Safety: Goal: Ability to remain free from injury will improve Outcome: Progressing   Problem: Skin Integrity: Goal: Risk for impaired skin integrity will decrease Outcome: Progressing   Problem: Education: Goal: Knowledge of General Education information will improve Description: Including pain rating scale, medication(s)/side effects and non-pharmacologic comfort measures Outcome: Not Progressing   Problem: Health Behavior/Discharge Planning: Goal: Ability to manage health-related needs will improve Outcome: Not Progressing   Problem: Clinical Measurements: Goal: Ability to maintain clinical measurements within normal limits will improve Outcome: Not Progressing Goal: Diagnostic test results will improve Outcome: Not Progressing   Problem: Activity: Goal: Risk for activity intolerance will decrease Outcome: Not Progressing   Problem: Nutrition: Goal: Adequate nutrition will be maintained Outcome: Not Progressing

## 2023-11-04 NOTE — Assessment & Plan Note (Addendum)
 Will continue to offer symptomatic support and encouraged good oral intake.  Not eating currently, unclear if this is due to abdominal pain, nausea, some other cause. - Caution with QTc prolonging agents - start protonix 40mg  daily - consider GI consult if no improvement -ODT Zofran, consider benadryl or scopolamine if more control is needed

## 2023-11-05 ENCOUNTER — Inpatient Hospital Stay (HOSPITAL_COMMUNITY)

## 2023-11-05 DIAGNOSIS — R1084 Generalized abdominal pain: Secondary | ICD-10-CM | POA: Diagnosis not present

## 2023-11-05 DIAGNOSIS — R630 Anorexia: Secondary | ICD-10-CM

## 2023-11-05 DIAGNOSIS — R112 Nausea with vomiting, unspecified: Secondary | ICD-10-CM | POA: Diagnosis not present

## 2023-11-05 DIAGNOSIS — N179 Acute kidney failure, unspecified: Secondary | ICD-10-CM | POA: Diagnosis not present

## 2023-11-05 LAB — BASIC METABOLIC PANEL
Anion gap: 9 (ref 5–15)
BUN: 5 mg/dL — ABNORMAL LOW (ref 6–20)
CO2: 24 mmol/L (ref 22–32)
Calcium: 9.1 mg/dL (ref 8.9–10.3)
Chloride: 106 mmol/L (ref 98–111)
Creatinine, Ser: 1.16 mg/dL — ABNORMAL HIGH (ref 0.44–1.00)
GFR, Estimated: 57 mL/min — ABNORMAL LOW (ref >60)
Glucose, Bld: 120 mg/dL — ABNORMAL HIGH (ref 70–99)
Potassium: 3.4 mmol/L — ABNORMAL LOW (ref 3.5–5.1)
Sodium: 139 mmol/L (ref 135–145)

## 2023-11-05 LAB — MAGNESIUM: Magnesium: 1.4 mg/dL — ABNORMAL LOW (ref 1.7–2.4)

## 2023-11-05 LAB — GLUCOSE, CAPILLARY
Glucose-Capillary: 105 mg/dL — ABNORMAL HIGH (ref 70–99)
Glucose-Capillary: 106 mg/dL — ABNORMAL HIGH (ref 70–99)
Glucose-Capillary: 113 mg/dL — ABNORMAL HIGH (ref 70–99)
Glucose-Capillary: 69 mg/dL — ABNORMAL LOW (ref 70–99)
Glucose-Capillary: 71 mg/dL (ref 70–99)

## 2023-11-05 MED ORDER — POTASSIUM CHLORIDE CRYS ER 20 MEQ PO TBCR
40.0000 meq | EXTENDED_RELEASE_TABLET | Freq: Once | ORAL | Status: DC
Start: 2023-11-05 — End: 2023-11-06
  Filled 2023-11-05: qty 2

## 2023-11-05 MED ORDER — MAGNESIUM SULFATE 2 GM/50ML IV SOLN
2.0000 g | Freq: Once | INTRAVENOUS | Status: AC
  Administered 2023-11-05: 2 g via INTRAVENOUS
  Filled 2023-11-05: qty 50

## 2023-11-05 MED ORDER — VALPROATE SODIUM 100 MG/ML IV SOLN
250.0000 mg | Freq: Four times a day (QID) | INTRAVENOUS | Status: DC
  Administered 2023-11-05 – 2023-11-12 (×25): 250 mg via INTRAVENOUS
  Filled 2023-11-05: qty 250
  Filled 2023-11-05 (×2): qty 2.5
  Filled 2023-11-05 (×2): qty 250
  Filled 2023-11-05 (×10): qty 2.5
  Filled 2023-11-05: qty 250
  Filled 2023-11-05 (×2): qty 2.5
  Filled 2023-11-05: qty 250
  Filled 2023-11-05: qty 2.5
  Filled 2023-11-05: qty 250
  Filled 2023-11-05 (×11): qty 2.5
  Filled 2023-11-05: qty 250
  Filled 2023-11-05 (×2): qty 2.5
  Filled 2023-11-05 (×2): qty 250

## 2023-11-05 MED ORDER — ENOXAPARIN SODIUM 40 MG/0.4ML IJ SOSY
40.0000 mg | PREFILLED_SYRINGE | INTRAMUSCULAR | Status: DC
  Administered 2023-11-06 – 2023-11-12 (×5): 40 mg via SUBCUTANEOUS
  Filled 2023-11-05 (×7): qty 0.4

## 2023-11-05 MED ORDER — MAGNESIUM SULFATE 2 GM/50ML IV SOLN: 2.0000 g | Freq: Once | INTRAVENOUS | Status: DC

## 2023-11-05 NOTE — Assessment & Plan Note (Addendum)
 Patient currently on clear liquid diet after choking event yesterday while attempting to take her medication.  Still not taking in p.o. well. Has had loss of appetite, vomiting for several weeks. Now with choking episode there is concern for esophageal/gastric pathology. CT A/P 11/03/23 with no acute findings. CBGs lower while on clear liquid diet and not taking in PO, so D5 added to maintenance fluids. - GI consulted, appreciate recommendations - Tylenol 650 mg every 6 hours as needed - Zofran 4 mg every 6 hours as needed - Protonix 40 mg daily - PT OT eval - Caution QTc prolonging agents - CBG check q4h

## 2023-11-05 NOTE — Progress Notes (Signed)
   11/05/23 1100  Spiritual Encounters  Type of Visit Initial  Care provided to: Family  Referral source Family  Reason for visit Advance directives  OnCall Visit No   Chaplain responded to request for HCPOA. Patient's aunt was at bedside. She stated that Verneda is mentally handicapped and she is not capable to understand this document. Chaplain explained that the patient has to understand and sign the legal document by herself. At this point, she is not able to do it. Pt's aunt stated that she and her mom are her guardian. After the pt's mom had died they began to take care of her. Her aunt was informed about why the document could not be completed by the guardian. She was told that if any situation arises, she may have right to speak.    M.Kubra Delano Metz Resident [phone]

## 2023-11-05 NOTE — Assessment & Plan Note (Deleted)
 S/p ceftriaxone 3/20-3/22.  Abdominal CT unremarkable aside from uterine fibroids.  - Tylenol 650 mg q6 PRN - Zofran as needed

## 2023-11-05 NOTE — Assessment & Plan Note (Deleted)
 GERD-Pepcid 20 mg twice daily Seizures-continue home Tegretol 300 mg 3 times daily, Depakote 500 mg twice daily, Topamax 75 mg twice daily Hypertension-holding home medications given borderline pressures HLD-continue atorvastatin 40 mg daily

## 2023-11-05 NOTE — Assessment & Plan Note (Addendum)
 Creatinine continues to improve, awaiting this morning's BMP.  - D5 LR at 100 mL/h - AM BMP, mag

## 2023-11-05 NOTE — Progress Notes (Addendum)
 Going to ultrasound   Ultrasound called and stated was unable to get ultrasound due to pt refusing. MD aware

## 2023-11-05 NOTE — Plan of Care (Signed)
 Notified by RN, patient refused meds this morning.  Switched Depakote and Topamax to IV.  Unable to switch Tegretol. Will continue to try to get patient to take her medications

## 2023-11-05 NOTE — Progress Notes (Signed)
     Daily Progress Note Intern Pager: [phone]  Patient name: Patricia White Medical record number: [id number] Date of birth: 1971/03/23 Age: 53 y.o. Gender: female  Primary Care Provider: Celine Mans, MD Consultants: GI Code Status: DNR limited  Pt Overview and Major Events to Date:  3/20 - Admitted 3/24 - Rapid response called due to pt choking on pill  Assessment and Plan:  53 year old with past medical history of cerebral palsy, GERD, seizures, hypertension, hyperlipidemia admitted for acute kidney injury, nausea, vomiting, abdominal pain. Assessment & Plan AKI (acute kidney injury) (HCC) Creatinine continues to improve, awaiting this morning's BMP.  - D5 LR at 100 mL/h - AM BMP, mag Nausea  Abdominal Pain Patient currently on clear liquid diet after choking event yesterday while attempting to take her medication.  Still not taking in p.o. well. Has had loss of appetite, vomiting for several weeks. Now with choking episode there is concern for esophageal/gastric pathology. CT A/P 11/03/23 with no acute findings. CBGs lower while on clear liquid diet and not taking in PO, so D5 added to maintenance fluids. - GI consulted, appreciate recommendations - Tylenol 650 mg every 6 hours as needed - Zofran 4 mg every 6 hours as needed - Protonix 40 mg daily - PT OT eval - Caution QTc prolonging agents - CBG check q4h  Chronic and Stable Problems:  GERD-Pepcid 20mg  BID Seizures-continue home Tegretol 300 mg twice daily, Depakote 500 mg twice daily, Topamax 75 mg twice daily Hypertension-holding home blood pressure medications Hyperlipidemia-continue atorvastatin 40mg  daily Constipation-stable, Miralax, Senna daily Oral thrush-oral nystatin 4 times daily  FEN/GI: Clear liquid diet PPx: Lovenox Dispo:Home pending clinical improvement   Subjective:  NAEON. No concerns this morning. Aunt at bedside  Objective: Temp:  [97.4 F (36.3 C)-98.2 F (36.8 C)] 98.1 F (36.7 C)  (03/24 0508) Pulse Rate:  [77-134] 77 (03/24 0829) Resp:  [16-20] 17 (03/24 0829) BP: (95-155)/(63-110) 138/100 (03/24 0829) SpO2:  [100 %] 100 % (03/24 0829) Physical Exam: General: Resting comfortably, sleeping, easily arousable to voice Cardiovascular: Regular rate and rhythm, no murmurs Respiratory: CTAB, breathing comfortably on room air Abdomen: Bowel sounds, soft  Laboratory: Most recent CBC Lab Results  Component Value Date   WBC 3.4 (L) 11/02/2023   HGB 11.1 (L) 11/02/2023   HCT 34.1 (L) 11/02/2023   MCV 93.2 11/02/2023   PLT 230 11/02/2023   Most recent BMP    Latest Ref Rng & Units 11/04/2023    4:26 AM  BMP  Glucose 70 - 99 mg/dL 89   BUN 6 - 20 mg/dL 11   Creatinine 1.47 - 1.00 mg/dL 8.29   Sodium 562 - 130 mmol/L 141   Potassium 3.5 - 5.1 mmol/L 3.9   Chloride 98 - 111 mmol/L 108   CO2 22 - 32 mmol/L 23   Calcium 8.9 - 10.3 mg/dL 9.1    Imaging/Diagnostic Tests: CT AP W contrast 11/03/23 IMPRESSION: 1. No acute findings. 2. Fibroid uterus.  Effrey Davidow, DO 11/05/2023, 9:17 AM  PGY-1, [facility] FPTS Intern pager: [phone], text pages welcome Secure chat group [facility] Teaching Service

## 2023-11-05 NOTE — Assessment & Plan Note (Deleted)
 AM K 3.9. -Continue to trend with AM BMP

## 2023-11-05 NOTE — Assessment & Plan Note (Signed)
>>  ASSESSMENT AND PLAN FOR NAUSEA  ABDOMINAL PAIN WRITTEN ON 11/05/2023 10:59 AM BY Para March, DO  Patient currently on clear liquid diet after choking event yesterday while attempting to take her medication.  Still not taking in p.o. well. Has had loss of appetite, vomiting for several weeks. Now with choking episode there is concern for esophageal/gastric pathology. CT A/P 11/03/23 with no acute findings. CBGs lower while on clear liquid diet and not taking in PO, so D5 added to maintenance fluids. - GI consulted, appreciate recommendations - Tylenol 650 mg every 6 hours as needed - Zofran 4 mg every 6 hours as needed - Protonix 40 mg daily - PT OT eval - Caution QTc prolonging agents - CBG check q4h

## 2023-11-05 NOTE — Assessment & Plan Note (Deleted)
-   Oral Nystatin suspension 4 times daily

## 2023-11-05 NOTE — Consult Note (Addendum)
 Consultation  Referring Provider:     Para March, DO Primary Care Physician:  Celine Mans, MD Primary Gastroenterologist:      Dr. Lavon Paganini  Reason for Consultation:    Nausea/vomiting, decreased appetite         HPI:   Patricia White is a 53 y.o. female with a history of cerebral palsy, HTN, HLD, seizures, GERD, chronic constipation, admitted on 11/01/2023 with AKI.  Her aunt is in the room and helps supply her history.  She reports Natosha had nausea/vomiting and generalized abdominal pain with decreased appetite a few days prior to hospital admission.  Admission labs notable for BUN/creatinine 45/2.32 (baseline creatinine~1.2 in 07/2023), which have improved with IV fluids, now 11/1.39.  CT on 3/22 with essentially normal-appearing GI tract, liver, pancreas, GB.   Patient with reported choking event while taking pills yesterday.  SLP evaluation on 3/22 without any difficulty tolerating p.o. intake, just decreased appetite.  Recommended regular/thin liquid with general swallow precautions with assist for meals and verbal encouragement without any need for SLP follow-up.  Past Medical History:  Diagnosis Date   Asthma    Cerebral palsy (HCC) Birth   Mental retardation Birth   Seizures [facility])     History reviewed. No pertinent surgical history.  Family History  Problem Relation Age of Onset   Migraines Mother    Cancer Mother        liver cancer   Diabetes Maternal Aunt    Hypertension Maternal Grandmother    Colon cancer Maternal Grandfather 60   Prostate cancer Maternal Grandfather    Throat cancer Maternal Grandfather      Social History   Tobacco Use   Smoking status: Never   Smokeless tobacco: Never  Vaping Use   Vaping status: Never Used  Substance Use Topics   Alcohol use: No   Drug use: No    Prior to Admission medications   Medication Sig Start Date End Date Taking? Authorizing Provider  atorvastatin (LIPITOR) 40 MG tablet TAKE 1 TABLET BY  MOUTH DAILY Patient taking differently: Take 40 mg by mouth in the morning. 07/30/23  Yes Celine Mans, MD  divalproex (DEPAKOTE) 250 MG DR tablet Take 2 tablets (500 mg total) by mouth 2 (two) times daily. **PATIENT NEEDS AN OFFICE VISIT FOR FUTURE Rx REFILLS** 08/01/23  Yes McCue, Shanda Bumps, NP  famotidine (PEPCID) 20 MG tablet Take 1 tablet (20 mg total) by mouth 2 (two) times daily for 14 days. 07/17/23 11/01/23 Yes Celine Mans, MD  indapamide (LOZOL) 1.25 MG tablet Take 1.25 mg by mouth in the morning. 08/22/23  Yes [provider]  levocetirizine (XYZAL) 5 MG tablet TAKE 1 TABLET BY MOUTH 2 TIMES DAILY 03/17/21  Yes Padgett, Pilar Grammes, MD  losartan (COZAAR) 100 MG tablet TAKE ONE-HALF TABLET BY MOUTH AT BEDTIME 10/02/23  Yes Glendale Chard, DO  TEGRETOL 200 MG tablet TAKE 1 AND 1/2 TABLETS BY MOUTH  TWICE DAILY IN THE MORNING AND  AT BEDTIME 08/07/23  Yes Penumalli, Glenford Bayley, MD  topiramate (TOPAMAX) 25 MG tablet TAKE 3 TABLETS BY MOUTH TWICE  DAILY 08/16/23  Yes Ihor Austin, NP    Current Facility-Administered Medications  Medication Dose Route Frequency Provider Last Rate Last Admin   acetaminophen (TYLENOL) tablet 650 mg  650 mg Oral Q6H PRN Cyndia Skeeters, DO       Or   acetaminophen (TYLENOL) suppository 650 mg  650 mg Rectal Q6H PRN Cyndia Skeeters, DO  atorvastatin (LIPITOR) tablet 40 mg  40 mg Oral Daily Cyndia Skeeters, DO   40 mg at 11/04/23 1036   carbamazepine (TEGRETOL) chewable tablet 300 mg  300 mg Oral BID Cyndia Skeeters, DO   300 mg at 11/03/23 2209   dextrose 5 % in lactated ringers infusion   Intravenous Continuous Cyndia Skeeters, DO 100 mL/hr at 11/04/23 2344 New Bag at 11/04/23 2344   enoxaparin (LOVENOX) injection 40 mg  40 mg Subcutaneous Q24H Everhart, Kirstie, DO       famotidine (PEPCID) tablet 20 mg  20 mg Oral BID Spence, Sarah, DO   20 mg at 11/04/23 1036   nystatin (MYCOSTATIN) 100000 UNIT/ML suspension 500,000 Units  5 mL Oral QID Tiffany Kocher, DO   500,000 Units at 11/03/23 2210   ondansetron (ZOFRAN) tablet 4 mg  4 mg Oral Q6H PRN Cyndia Skeeters, DO   4 mg at 11/02/23 1610   Or   ondansetron (ZOFRAN) injection 4 mg  4 mg Intravenous Q6H PRN Cyndia Skeeters, DO       pantoprazole (PROTONIX) EC tablet 40 mg  40 mg Oral Daily Vonna Drafts, MD       polyethylene glycol (MIRALAX / GLYCOLAX) packet 17 g  17 g Oral Daily Cyndia Skeeters, DO   17 g at 11/04/23 1035   senna-docusate (Senokot-S) tablet 1 tablet  1 tablet Oral QHS Cyndia Skeeters, DO   1 tablet at 11/03/23 2210   topiramate (TOPAMAX) tablet 75 mg  75 mg Oral BID Cyndia Skeeters, DO   75 mg at 11/04/23 1035   valproate (DEPACON) 250 mg in dextrose 5 % 50 mL IVPB  250 mg Intravenous Q6H Caro Laroche, DO 52.5 mL/hr at 11/05/23 1423 250 mg at 11/05/23 1423    Allergies as of 11/01/2023   (No Known Allergies)     Review of Systems:    As per HPI, otherwise negative    Physical Exam:  Vital signs in last 24 hours: Temp:  [97.4 F (36.3 C)-98.2 F (36.8 C)] 98.1 F (36.7 C) (03/24 0508) Pulse Rate:  [77-98] 77 (03/24 0829) Resp:  [16-20] 17 (03/24 0829) BP: (95-138)/(75-100) 138/100 (03/24 0829) SpO2:  [100 %] 100 % (03/24 0829) Last BM Date : 11/01/23 General:   Pleasant female, resting in bed.  Patient declines physical exam.  LAB RESULTS: No results for input(s): "WBC", "HGB", "HCT", "PLT" in the last 72 hours. BMET Recent Labs    11/03/23 0423 11/04/23 0426  NA 139 141  K 3.0* 3.9  CL 104 108  CO2 24 23  GLUCOSE 84 89  BUN 19 11  CREATININE 1.62* 1.39*  CALCIUM 9.0 9.1   LFT No results for input(s): "PROT", "ALBUMIN", "AST", "ALT", "ALKPHOS", "BILITOT", "BILIDIR", "IBILI" in the last 72 hours. PT/INR No results for input(s): "LABPROT", "INR" in the last 72 hours.  STUDIES: No results found.   PREVIOUS ENDOSCOPIES:            None   Impression / Plan:   1) Nausea/vomiting 2) Upper abdominal pain Relatively acute onset  nausea/vomiting, decreased appetite, generalized upper abdominal pain few days prior to hospital admission.  Per aunt at bedside, patient normally with good p.o. intake and no noted history of nausea/vomiting, dysphagia, or other issues impacting p.o. intake.  Admission CT otherwise unremarkable.  Potentially acute infectious etiology, and no nausea/vomiting last evening or today. - Continue IV fluids - Continue encouraging p.o. intake as tolerated - Continue Protonix as prescribed - Zofran  as needed - Abdominal US - No role for endoscopic evaluation at this juncture.  If symptoms persist or recur, could consider EGD to evaluate for PUD, GOO, gastritis, etc. - Diet per SLP recommendations from 3/22  3) AKI - Renal function improving.  Suspect 2/2 above nausea/vomiting and decreased p.o. intake - Continue trending BMP daily  Inpatient GI service will continue to follow.  Doristine Locks, DO, [facility]    LOS: 1 day   Shellia Cleverly  11/05/2023, 2:48 PM

## 2023-11-06 ENCOUNTER — Inpatient Hospital Stay (HOSPITAL_COMMUNITY)

## 2023-11-06 DIAGNOSIS — R112 Nausea with vomiting, unspecified: Secondary | ICD-10-CM | POA: Diagnosis not present

## 2023-11-06 DIAGNOSIS — Z7189 Other specified counseling: Secondary | ICD-10-CM

## 2023-11-06 DIAGNOSIS — R1084 Generalized abdominal pain: Secondary | ICD-10-CM | POA: Diagnosis not present

## 2023-11-06 DIAGNOSIS — Z515 Encounter for palliative care: Secondary | ICD-10-CM | POA: Diagnosis not present

## 2023-11-06 DIAGNOSIS — B370 Candidal stomatitis: Secondary | ICD-10-CM

## 2023-11-06 DIAGNOSIS — N179 Acute kidney failure, unspecified: Secondary | ICD-10-CM | POA: Diagnosis not present

## 2023-11-06 DIAGNOSIS — R630 Anorexia: Secondary | ICD-10-CM | POA: Diagnosis not present

## 2023-11-06 LAB — GLUCOSE, CAPILLARY
Glucose-Capillary: 98 mg/dL (ref 70–99)
Glucose-Capillary: 68 mg/dL — ABNORMAL LOW (ref 70–99)
Glucose-Capillary: 109 mg/dL — ABNORMAL HIGH (ref 70–99)
Glucose-Capillary: 86 mg/dL (ref 70–99)
Glucose-Capillary: 67 mg/dL — ABNORMAL LOW (ref 70–99)

## 2023-11-06 LAB — CBC
HCT: 32.7 % — ABNORMAL LOW (ref 36.0–46.0)
Hemoglobin: 11.1 g/dL — ABNORMAL LOW (ref 12.0–15.0)
MCH: 30.7 pg (ref 26.0–34.0)
MCHC: 33.9 g/dL (ref 30.0–36.0)
MCV: 90.6 fL (ref 80.0–100.0)
Platelets: 152 10*3/uL (ref 150–400)
RBC: 3.61 MIL/uL — ABNORMAL LOW (ref 3.87–5.11)
RDW: 12.7 % (ref 11.5–15.5)
WBC: 3.3 10*3/uL — ABNORMAL LOW (ref 4.0–10.5)
nRBC: 0 % (ref 0.0–0.2)

## 2023-11-06 LAB — BASIC METABOLIC PANEL
Anion gap: 14 (ref 5–15)
Anion gap: 10 (ref 5–15)
BUN: <5 mg/dL — ABNORMAL LOW (ref 6–20)
BUN: <5 mg/dL — ABNORMAL LOW (ref 6–20)
CO2: 21 mmol/L — ABNORMAL LOW (ref 22–32)
CO2: 22 mmol/L (ref 22–32)
Calcium: 9 mg/dL (ref 8.9–10.3)
Calcium: 8.5 mg/dL — ABNORMAL LOW (ref 8.9–10.3)
Chloride: 101 mmol/L (ref 98–111)
Chloride: 104 mmol/L (ref 98–111)
Creatinine, Ser: 1.02 mg/dL — ABNORMAL HIGH (ref 0.44–1.00)
Creatinine, Ser: 1.07 mg/dL — ABNORMAL HIGH (ref 0.44–1.00)
GFR, Estimated: >60 mL/min (ref >60)
GFR, Estimated: >60 mL/min (ref >60)
Glucose, Bld: 91 mg/dL (ref 70–99)
Glucose, Bld: 79 mg/dL (ref 70–99)
Potassium: 2.9 mmol/L — ABNORMAL LOW (ref 3.5–5.1)
Potassium: 3.1 mmol/L — ABNORMAL LOW (ref 3.5–5.1)
Sodium: 136 mmol/L (ref 135–145)
Sodium: 136 mmol/L (ref 135–145)

## 2023-11-06 LAB — MAGNESIUM: Magnesium: 1.7 mg/dL (ref 1.7–2.4)

## 2023-11-06 MED ORDER — PANTOPRAZOLE SODIUM 40 MG IV SOLR
40.0000 mg | Freq: Every day | INTRAVENOUS | Status: DC
Start: 2023-11-06 — End: 2023-11-12
  Administered 2023-11-06 – 2023-11-12 (×6): 40 mg via INTRAVENOUS
  Filled 2023-11-06 (×6): qty 10

## 2023-11-06 MED ORDER — DEXTROSE IN LACTATED RINGERS 5 % IV SOLN: INTRAVENOUS | Status: DC

## 2023-11-06 MED ORDER — POTASSIUM CHLORIDE 10 MEQ/100ML IV SOLN
10.0000 meq | INTRAVENOUS | Status: AC
  Administered 2023-11-06 (×2): 10 meq via INTRAVENOUS
  Filled 2023-11-06 (×2): qty 100

## 2023-11-06 MED ORDER — DEXTROSE 50 % IV SOLN
1.0000 | Freq: Once | INTRAVENOUS | Status: AC
  Administered 2023-11-06: 50 mL via INTRAVENOUS
  Filled 2023-11-06: qty 50

## 2023-11-06 MED ORDER — PROCHLORPERAZINE EDISYLATE 10 MG/2ML IJ SOLN
5.0000 mg | Freq: Three times a day (TID) | INTRAMUSCULAR | Status: AC
  Administered 2023-11-06 – 2023-11-07 (×5): 5 mg via INTRAVENOUS
  Filled 2023-11-06 (×5): qty 2

## 2023-11-06 MED ORDER — LORAZEPAM 2 MG/ML IJ SOLN: 1.0000 mg | Freq: Once | INTRAMUSCULAR | Status: DC | PRN

## 2023-11-06 MED ORDER — FLUCONAZOLE IN SODIUM CHLORIDE 400-0.9 MG/200ML-% IV SOLN
400.0000 mg | Freq: Every day | INTRAVENOUS | Status: DC
  Administered 2023-11-06: 400 mg via INTRAVENOUS
  Filled 2023-11-06 (×2): qty 200

## 2023-11-06 MED ORDER — LEVETIRACETAM IN NACL 500 MG/100ML IV SOLN
500.0000 mg | Freq: Two times a day (BID) | INTRAVENOUS | Status: DC
  Administered 2023-11-06 – 2023-11-12 (×13): 500 mg via INTRAVENOUS
  Filled 2023-11-06 (×14): qty 100

## 2023-11-06 NOTE — Assessment & Plan Note (Signed)
 Unclear if resolved, cannot see in patient's mouth, she is not using oral nystatin. May be contributing to loss of appetite.  - Fluconazole 400mg  q24h IV

## 2023-11-06 NOTE — Progress Notes (Signed)
 PT Cancellation Note  Patient Details Name: Patricia White MRN: [id number] DOB: [birth year]   Cancelled Treatment:    Reason Eval/Treat Not Completed: Patient declined, no reason specified;Fatigue/lethargy limiting ability to participate. Patient opened eyes briefly, but declines activity. Actively resisting movement with max encouragement provided. Will re-attempt at later date.     Carlena Ruybal 11/06/2023, 3:05 PM

## 2023-11-06 NOTE — Assessment & Plan Note (Signed)
>>  ASSESSMENT AND PLAN FOR NAUSEA  ABDOMINAL PAIN WRITTEN ON 11/06/2023 10:39 AM BY Para March, DO  Unclear etiology, patient refusing all p.o. intake.  Refused abdominal ultrasound yesterday but states that we can sedate patient if we need to in order to get this completed today. Also discussed getting palliative care on board, which aunt is in agreement to.  - GI consulted, appreciate recommendations - Palliative care consulted, appreciate recommendations - Obtain abdominal US with IV lorazepam  - Tylenol 650 mg every 6 hours as needed - Zofran 4 mg every 6 hours as needed - Protonix 40 mg daily now IV - PT OT eval - Caution QTc prolonging agents - CBG check q4h to monitor hypoglycemia

## 2023-11-06 NOTE — Progress Notes (Addendum)
 Daily Progress Note Intern Pager: [phone]  Patient name: Patricia White Medical record number: [id number] Date of birth: 09/15/1970 Age: 53 y.o. Gender: female  Primary Care Provider: Celine Mans, MD Consultants: GI, palliative care Code Status: DNR limited  Pt Overview and Major Events to Date:  3/20 - Admitted 3/24 - Rapid response called after episode of choking on pill  Assessment and Plan:  53 year old female with past medical history cerebral palsy, GERD, seizures, hypertension, hyperlipidemia admitted for AKI, also with decreased appetite, nausea, vomiting, abdominal pain. Assessment & Plan AKI (acute kidney injury) (HCC) Creatinine continues to improve, down to 1.02 this morning.  - D5 LR at 100 mL/h - AM BMP, mag Nausea  Abdominal Pain Unclear etiology, patient refusing all p.o. intake.  Refused abdominal ultrasound yesterday but states that we can sedate patient if we need to in order to get this completed today. Also discussed getting palliative care on board, which aunt is in agreement to.  - GI consulted, appreciate recommendations - Palliative care consulted, appreciate recommendations - Obtain abdominal US with IV lorazepam  - Tylenol 650 mg every 6 hours as needed - Zofran 4 mg every 6 hours as needed - Protonix 40 mg daily now IV - PT OT eval - Caution QTc prolonging agents - CBG check q4h to monitor hypoglycemia Decreased appetite Unclear etiology, has been going on since admission. - GI following - continue D5LR @100ml /h - CBG checks to monitor for hypoglycemia Oral thrush Unclear if resolved, cannot see in patient's mouth, she is not using oral nystatin. May be contributing to loss of appetite.  - Fluconazole 400mg  q24h IV  Chronic and Stable Problems:  GERD-will likely take off Pepcid twice daily as patient is declining PO Seizures-IV Depakote 500 mg twice daily, started Keppra 100mg  BID IV, unable to tolerate Tegretol and Topamax  PO Hypertension-holding home blood pressure medications Hyperlipidemia-pravastatin 40 mg daily, however patient is not taking Constipation-stable, last bowel movement 3/24, patient not taking MiraLAX or senna  FEN/GI: Clear liquid diet PPx: Lovenox Dispo:Home pending clinical improvement   Subjective:  NAEON. Pt refusing all meds, refused abdominal US. Will not communicate with me. Aunt states "do whatever you need to do to figure out what's wrong"  Objective: Temp:  [97.5 F (36.4 C)-98.3 F (36.8 C)] 98.3 F (36.8 C) (03/25 0823) Pulse Rate:  [84-103] 84 (03/25 0823) Resp:  [18] 18 (03/25 0823) BP: (138-152)/(86-101) 142/86 (03/25 0823) SpO2:  [100 %] 100 % (03/25 1478) Physical Exam: General: Sleepy, easily arousable to voice.  Pulling away from me and saying no Cardiovascular: RRR, no murmurs Respiratory: CTAB, breathing comfortably on room air Abdomen: Normal bowel sounds, soft Extremities: No swelling BLE  Laboratory: Most recent CBC Lab Results  Component Value Date   WBC 3.3 (L) 11/06/2023   HGB 11.1 (L) 11/06/2023   HCT 32.7 (L) 11/06/2023   MCV 90.6 11/06/2023   PLT 152 11/06/2023   Most recent BMP    Latest Ref Rng & Units 11/06/2023    6:16 AM  BMP  Glucose 70 - 99 mg/dL 91   BUN 6 - 20 mg/dL <5   Creatinine 2.95 - 1.00 mg/dL 6.21   Sodium 308 - 657 mmol/L 136   Potassium 3.5 - 5.1 mmol/L 2.9   Chloride 98 - 111 mmol/L 101   CO2 22 - 32 mmol/L 21   Calcium 8.9 - 10.3 mg/dL 9.0    Magnesium 1.7  Arsen Mangione, DO 11/06/2023, 10:52 AM  PGY-1, [facility] FPTS Intern pager: [phone], text pages welcome Secure chat group [facility] Teaching Service

## 2023-11-06 NOTE — Assessment & Plan Note (Signed)
 Unclear etiology, has been going on since admission. - GI following - continue D5LR @100ml /h - CBG checks to monitor for hypoglycemia

## 2023-11-06 NOTE — Assessment & Plan Note (Addendum)
 Creatinine continues to improve, down to 1.02 this morning.  - D5 LR at 100 mL/h - AM BMP, mag

## 2023-11-06 NOTE — Progress Notes (Addendum)
 Patient ID: Patricia White, female   DOB: 1970/09/19, 53 y.o.   MRN: [id number]     Attending physician's note   I have taken a history, reviewed the chart, and examined the patient. I performed a substantive portion of this encounter, including complete performance of at least one of the key components, in conjunction with the APP. I agree with the APP's note, impression, and recommendations with my edits.   Shawnika Pepin, DO, FACG [phone] office          Progress Note   Subjective   Day # 6 CC; nausea, vomiting, refusing oral intake-patient with cerebral palsy  Patient's aunt is at bedside, relates patient is still not eating or drinking anything.  Patient opened her eyes to my exam but did not answer when asked if he was hurting in her abdomen.  IV Protonix twice daily Zofran as needed  Abdominal ultrasound pending  Labs today WBC 3.3/hemoglobin 11.1/hematocrit 32.7/platelets 152 K+ 2.9/BUN 5/creatinine 1.02 UA multiple species suggested recollection   Objective   Vital signs in last 24 hours: Temp:  [97.5 F (36.4 C)-98.3 F (36.8 C)] 98.1 F (36.7 C) (03/25 1143) Pulse Rate:  [84-103] 88 (03/25 1143) Resp:  [16-18] 16 (03/25 1143) BP: (135-152)/(86-101) 135/93 (03/25 1143) SpO2:  [100 %] 100 % (03/25 1143) Last BM Date : 11/05/23 General:    Older African-American female in NAD somnolent, arouses to exam Heart:  Regular rate and rhythm; no murmurs Lungs: Respirations even and unlabored, lungs CTA bilaterally Abdomen:  Soft, cannot appreciate any definite tenderness in her abdomen, no palpable mass or hepatosplenomegaly, normal bowel sounds. Extremities:  Without edema. Neurologic: Somnolent Psych:  Cooperative. Intake/Output from previous day: 03/24 0701 - 03/25 0700 In: -  Out: 150 [Urine:150] Intake/Output this shift: No intake/output data recorded.  Lab Results: Recent Labs    11/06/23 0616  WBC 3.3*  HGB 11.1*  HCT 32.7*  PLT 152    BMET Recent Labs    11/04/23 0426 11/05/23 1521 11/06/23 0616  NA 141 139 136  K 3.9 3.4* 2.9*  CL 108 106 101  CO2 23 24 21*  GLUCOSE 89 120* 91  BUN 11 5* <5*  CREATININE 1.39* 1.16* 1.02*  CALCIUM 9.1 9.1 9.0   LFT No results for input(s): "PROT", "ALBUMIN", "AST", "ALT", "ALKPHOS", "BILITOT", "BILIDIR", "IBILI" in the last 72 hours. PT/INR No results for input(s): "LABPROT", "INR" in the last 72 hours.  Studies/Results: No results found.     Assessment / Plan:    #72 53 year old female with cerebral palsy admitted with nausea vomiting and refusal of p.o.'s.  Difficult historian but may have some upper abdominal discomfort/pain  CT was unremarkable Query UTI initial specimen with multiple species  would repeat  Upper abdominal ultrasound ordered-pending  Etiology of her symptoms is not clear this may still be a viral syndrome, would repeat UA rule out UTI.  CT is reassuring  #2 acute kidney injury improving #3  oral thrush -treating #4 seizure disorder  Plan-diet as tolerates Continue IV PPI daily Will order low dose compazine scheduled see if helps with nausea that she may not be able to express as such( Less concern for QT prologation) Agree with IV rx for oral candidiasis Repeat UA         Principal Problem:   AKI (acute kidney injury) (HCC) Active Problems:   Chronic health problem   Oral thrush   Nausea  Abdominal Pain   Decreased appetite  LOS: 2 days   Amy EsterwoodPA-C  11/06/2023, 12:03 PM

## 2023-11-06 NOTE — Progress Notes (Signed)
 OT Cancellation Note  Patient Details Name: Patricia White MRN: [id number] DOB: [birth year]   Cancelled Treatment:    Reason Eval/Treat Not Completed: Patient declined, no reason specified  Lebron Quam, OTR/L SecureChat Preferred Acute Rehab [phone]   Carver Fila Koonce 11/06/2023, 3:05 PM

## 2023-11-06 NOTE — Consult Note (Signed)
 Palliative Medicine Inpatient Consult Note  Consulting Provider: Para March, DO   Reason for consult:  Would benefit from discussion regarding goals of care and plan for when she goes home - aunt is HCPOA at bedside but likely cannot care for pt. Has been refusing all food, meds, and med workup   11/06/2023  HPI:  Per intake H&P --> 53 year old female with past medical history cerebral palsy, GERD, seizures, hypertension, hyperlipidemia admitted for AKI, also with decreased appetite, nausea, vomiting, abdominal pain.   Palliative care has been asked to support additional goals of care conversations.   Clinical Assessment/Goals of Care:  *Please note that this is a verbal dictation therefore any spelling or grammatical errors are due to the "Dragon Medical One" system interpretation.  I have reviewed medical records including EPIC notes, labs and imaging, received report from bedside RN, assessed the patient who is lying in bed in NAD, it took quite some time but after spending a while at bedside and asking pointed questions "White White" did respond to me.    I met with White White and her aunt, White White to further discuss diagnosis prognosis, GOC, EOL wishes, disposition and options.   I introduced Palliative Medicine as specialized medical care for people living with serious illness. It focuses on providing relief from the symptoms and stress of a serious illness. The goal is to improve quality of life for both the patient and the family.  Medical History Review and Understanding:  A review of Patricia White's past medical history inclusive of Cerebral Palsy, GERD, seizures, HTN, HLD, and asthma was completed.   Social History:  [place] In fact she was born at [facility] per her aunt. She has never been married now had children. She has not held a job due to her disability. She is a woman of strong Christian faith.   Functional and Nutritional State:  Preceding  hospitalization, White White was living with her aunt and able to mobilize on her own, eat/drink on  her own, and dress on her own. Her aunt does help with meals and iadls.   Advance Directives:  A detailed discussion was had today regarding advanced directives.  Patient has no advanced directives on file though per her aunt patients grandmother, White White would be the surrogate decision maker.    Code Status:  Concepts specific to code status, artifical feeding and hydration, continued IV antibiotics and rehospitalization was had.  The difference between a aggressive medical intervention path  and a palliative comfort care path for this patient at this time was had.   At this time, White White is enlisted as no chest compressions.   I provided education to White White and her aunt about intubation and cardiopulmonary shocks. I shared the possible discomfort which could potentially be induced with these interventions also. White White plans to speak to her mother, White White to identify if these are desired or not. White White shared that White White has an established DNR to her knowledge though the other details have not been discussed as a family.   Discussion:  We reviewed the circumstances surrounding Patricia White's present illness. Her aunt shares that her appetite had been poor over the past four days. She has refused to eat and had committing episodes.   We discussed that the goals of Patricia White's family are to have a complete medical workup. They patients aunt is hopeful the underlying reason for her symptoms can be identified and addressed.  We discussed best case and worst case scenarios. I shared the  best case - finding out what is wrong with Patricia White and her eating/drinking again. I shared the worst case in that a full medical workup is completed and we are still unable to identify the underlying reason for her declining appetite. Patients aunt shares that she and her mother would not want for White White to suffer if the latter were  a reality.   We did discuss if the worst case occurred how we could remediate symptoms and allow for dignity and peace if life were limited.   Plan at this time is to continue present work up allowing time for outcomes.   Discussed the importance of continued conversation with family and their  medical providers regarding overall plan of care and treatment options, ensuring decisions are within the context of the patients values and GOCs. __________________ Addendum:  I have tried to call White White, patients grandmother to update her on the above conversation. A HIPAA compliant VM has been left.   Decision Maker: PatriciaWhite (Legal Guardian): [phone] (Mobile)   SUMMARY OF RECOMMENDATIONS   DNAR   Have shared the importance of additional conversations regarding code status with patients aunt --> She plans to speak more to her mother about this  Continue medical workup allowing time for outcomes  Open and honest conversations held emphasizing best case and worst case scenarios  Ongoing PMT support  Code Status/Advance Care Planning: DNAR   Palliative Prophylaxis:  Aspiration, Bowel Regimen, Delirium Protocol, Frequent Pain Assessment, Oral Care, Palliative Wound Care, and Turn Reposition  Additional Recommendations (Limitations, Scope, Preferences): Continue present care  Psycho-social/Spiritual:  Desire for further Chaplaincy support: Yes - patients pastor will come Additional Recommendations: Education on adult FTT and long term impacts   Prognosis: Unable to determine at this time  Discharge Planning: Discharge plan to be determined.  Vitals:   11/06/23 0823 11/06/23 1143  BP: (!) 142/86 (!) 135/93  Pulse: 84 88  Resp: 18 16  Temp: 98.3 F (36.8 C) 98.1 F (36.7 C)  SpO2: 100% 100%    Intake/Output Summary (Last 24 hours) at 11/06/2023 1318 Last data filed at 11/06/2023 0017 Gross per 24 hour  Intake --  Output 150 ml  Net -150 ml   Last Weight   Most recent update: 11/01/2023  1:15 PM    Weight  75.4 kg (166 lb 3.6 oz)            Gen: Elderly middle aged AA F chronically ill appearing HEENT: Dry mucous membranes, poor dentition, (+) white patches on tongue CV: Regular rate and rhythm  PULM: On RA, breathing even and non-labored ABD: Abdominal tenderness with deep palpation EXT: No edema  Neuro: Alert to self  PPS: 20%   This conversation/these recommendations were discussed with patient primary care team, Dr. Linwood Dibbles  Billing based on MDM: High ______________________________________________________ Lamarr Lulas [facility] Palliative Medicine Team Team Cell Phone: [phone] Please utilize secure chat with additional questions, if there is no response within 30 minutes please call the above phone number  Palliative Medicine Team providers are available by phone from 7am to 7pm daily and can be reached through the team cell phone.  Should this patient require assistance outside of these hours, please call the patient's attending physician.

## 2023-11-06 NOTE — Assessment & Plan Note (Signed)
 Unclear etiology, patient refusing all p.o. intake.  Refused abdominal ultrasound yesterday but states that we can sedate patient if we need to in order to get this completed today. Also discussed getting palliative care on board, which aunt is in agreement to.  - GI consulted, appreciate recommendations - Palliative care consulted, appreciate recommendations - Obtain abdominal US with IV lorazepam  - Tylenol 650 mg every 6 hours as needed - Zofran 4 mg every 6 hours as needed - Protonix 40 mg daily now IV - PT OT eval - Caution QTc prolonging agents - CBG check q4h to monitor hypoglycemia

## 2023-11-07 ENCOUNTER — Encounter (HOSPITAL_COMMUNITY): Payer: Self-pay | Admitting: Anesthesiology

## 2023-11-07 ENCOUNTER — Other Ambulatory Visit: Payer: Self-pay

## 2023-11-07 ENCOUNTER — Encounter (HOSPITAL_COMMUNITY): Payer: Self-pay | Admitting: Family Medicine

## 2023-11-07 ENCOUNTER — Encounter (HOSPITAL_COMMUNITY)
Admission: EM | Disposition: A | Discharge status: Home Health | Payer: Self-pay | Source: Ambulatory Visit | Attending: Family Medicine

## 2023-11-07 DIAGNOSIS — R1084 Generalized abdominal pain: Secondary | ICD-10-CM | POA: Diagnosis not present

## 2023-11-07 DIAGNOSIS — E876 Hypokalemia: Secondary | ICD-10-CM

## 2023-11-07 DIAGNOSIS — N179 Acute kidney failure, unspecified: Secondary | ICD-10-CM | POA: Diagnosis not present

## 2023-11-07 DIAGNOSIS — Z515 Encounter for palliative care: Secondary | ICD-10-CM | POA: Diagnosis not present

## 2023-11-07 DIAGNOSIS — Z7189 Other specified counseling: Secondary | ICD-10-CM | POA: Diagnosis not present

## 2023-11-07 DIAGNOSIS — R112 Nausea with vomiting, unspecified: Secondary | ICD-10-CM | POA: Diagnosis not present

## 2023-11-07 DIAGNOSIS — R630 Anorexia: Secondary | ICD-10-CM | POA: Diagnosis not present

## 2023-11-07 LAB — BASIC METABOLIC PANEL
Anion gap: 12 (ref 5–15)
Anion gap: 9 (ref 5–15)
BUN: <5 mg/dL — ABNORMAL LOW (ref 6–20)
BUN: <5 mg/dL — ABNORMAL LOW (ref 6–20)
CO2: 22 mmol/L (ref 22–32)
CO2: 22 mmol/L (ref 22–32)
Calcium: 8.8 mg/dL — ABNORMAL LOW (ref 8.9–10.3)
Calcium: 8.6 mg/dL — ABNORMAL LOW (ref 8.9–10.3)
Chloride: 104 mmol/L (ref 98–111)
Chloride: 103 mmol/L (ref 98–111)
Creatinine, Ser: 1.06 mg/dL — ABNORMAL HIGH (ref 0.44–1.00)
Creatinine, Ser: 1.07 mg/dL — ABNORMAL HIGH (ref 0.44–1.00)
GFR, Estimated: >60 mL/min (ref >60)
GFR, Estimated: >60 mL/min (ref >60)
Glucose, Bld: 69 mg/dL — ABNORMAL LOW (ref 70–99)
Glucose, Bld: 83 mg/dL (ref 70–99)
Potassium: 2.8 mmol/L — ABNORMAL LOW (ref 3.5–5.1)
Potassium: 3.1 mmol/L — ABNORMAL LOW (ref 3.5–5.1)
Sodium: 138 mmol/L (ref 135–145)
Sodium: 134 mmol/L — ABNORMAL LOW (ref 135–145)

## 2023-11-07 LAB — GLUCOSE, CAPILLARY
Glucose-Capillary: 102 mg/dL — ABNORMAL HIGH (ref 70–99)
Glucose-Capillary: 48 mg/dL — ABNORMAL LOW (ref 70–99)
Glucose-Capillary: 54 mg/dL — ABNORMAL LOW (ref 70–99)
Glucose-Capillary: 59 mg/dL — ABNORMAL LOW (ref 70–99)
Glucose-Capillary: 63 mg/dL — ABNORMAL LOW (ref 70–99)
Glucose-Capillary: 67 mg/dL — ABNORMAL LOW (ref 70–99)
Glucose-Capillary: 69 mg/dL — ABNORMAL LOW (ref 70–99)
Glucose-Capillary: 80 mg/dL (ref 70–99)

## 2023-11-07 LAB — MAGNESIUM: Magnesium: 1.4 mg/dL — ABNORMAL LOW (ref 1.7–2.4)

## 2023-11-07 SURGERY — CANCELLED PROCEDURE

## 2023-11-07 MED ORDER — DEXTROSE-SODIUM CHLORIDE 5-0.45 % IV SOLN: INTRAVENOUS | Status: AC

## 2023-11-07 MED ORDER — THIAMINE MONONITRATE 100 MG PO TABS
100.0000 mg | ORAL_TABLET | Freq: Every day | ORAL | Status: DC
  Administered 2023-11-10 – 2023-11-14 (×5): 100 mg via ORAL
  Filled 2023-11-07 (×7): qty 1

## 2023-11-07 MED ORDER — ADULT MULTIVITAMIN W/MINERALS CH
1.0000 | ORAL_TABLET | Freq: Every day | ORAL | Status: DC
  Administered 2023-11-10 – 2023-11-16 (×7): 1 via ORAL
  Filled 2023-11-07 (×9): qty 1

## 2023-11-07 MED ORDER — DEXTROSE 50 % IV SOLN
INTRAVENOUS | Status: AC
Start: 2023-11-07 — End: ?
  Filled 2023-11-07: qty 50

## 2023-11-07 MED ORDER — FLUCONAZOLE IN SODIUM CHLORIDE 200-0.9 MG/100ML-% IV SOLN
200.0000 mg | Freq: Every day | INTRAVENOUS | Status: DC
  Administered 2023-11-07 – 2023-11-10 (×4): 200 mg via INTRAVENOUS
  Filled 2023-11-07 (×5): qty 100

## 2023-11-07 MED ORDER — POTASSIUM CHLORIDE 10 MEQ/100ML IV SOLN
10.0000 meq | INTRAVENOUS | Status: AC
  Administered 2023-11-07 – 2023-11-08 (×6): 10 meq via INTRAVENOUS
  Filled 2023-11-07 (×3): qty 100

## 2023-11-07 MED ORDER — DEXTROSE 5 % AND 0.45 % NACL IV BOLUS
250.0000 mL | Freq: Once | INTRAVENOUS | Status: AC
  Administered 2023-11-07: 250 mL via INTRAVENOUS

## 2023-11-07 MED ORDER — DEXTROSE 50 % IV SOLN
1.0000 | Freq: Once | INTRAVENOUS | Status: AC
  Administered 2023-11-07: 50 mL via INTRAVENOUS
  Filled 2023-11-07: qty 50

## 2023-11-07 MED ORDER — SODIUM CHLORIDE 0.9% FLUSH
10.0000 mL | Freq: Two times a day (BID) | INTRAVENOUS | Status: DC
  Administered 2023-11-07 – 2023-11-14 (×13): 10 mL
  Administered 2023-11-14: 20 mL
  Administered 2023-11-15 – 2023-11-16 (×3): 10 mL

## 2023-11-07 MED ORDER — MAGNESIUM SULFATE 2 GM/50ML IV SOLN
2.0000 g | Freq: Once | INTRAVENOUS | Status: AC
  Administered 2023-11-07: 2 g via INTRAVENOUS
  Filled 2023-11-07: qty 50

## 2023-11-07 MED ORDER — DEXTROSE 50 % IV SOLN: 1.0000 | Freq: Once | INTRAVENOUS | Status: DC

## 2023-11-07 MED ORDER — SODIUM CHLORIDE 0.9% FLUSH: 10.0000 mL | INTRAVENOUS | Status: DC | PRN

## 2023-11-07 MED ORDER — POTASSIUM CHLORIDE 10 MEQ/100ML IV SOLN
10.0000 meq | INTRAVENOUS | Status: AC
  Administered 2023-11-07: 10 meq via INTRAVENOUS
  Filled 2023-11-07 (×2): qty 100

## 2023-11-07 MED ORDER — DEXTROSE 10 % IV SOLN
INTRAVENOUS | Status: DC
  Filled 2023-11-07: qty 500
  Filled 2023-11-07: qty 1000
  Filled 2023-11-07: qty 500

## 2023-11-07 MED ORDER — ENSURE ENLIVE PO LIQD
237.0000 mL | Freq: Two times a day (BID) | ORAL | Status: DC
  Administered 2023-11-13 – 2023-11-14 (×3): 237 mL via ORAL

## 2023-11-07 MED ORDER — CHLORHEXIDINE GLUCONATE CLOTH 2 % EX PADS
6.0000 | MEDICATED_PAD | Freq: Every day | CUTANEOUS | Status: DC
  Administered 2023-11-09 – 2023-11-16 (×8): 6 via TOPICAL

## 2023-11-07 NOTE — Assessment & Plan Note (Addendum)
 Unclear etiology, pt still intermittently with abdominal pain and refusing PO intake. Planning for possible EGD tomorrow with GI, unable to be done today as IV was not working and electrolytes not optimal.  - GI consulted, appreciate recommendations - Palliative care following, appreciate recommendations - RD consulted, appreciate recommendations - Tylenol 650 mg every 6 hours as needed - Zofran 4 mg every 6 hours as needed - Protonix 40 mg IV daily - PT OT eval - Caution QTc prolonging agents - CBG check q4h to monitor hypoglycemia

## 2023-11-07 NOTE — Progress Notes (Addendum)
 Patient ID: Patricia White, female   DOB: 1971-03-25, 53 y.o.   MRN: [id number]     Attending physician's note   I have taken a history, reviewed the chart, and examined the patient. I performed a substantive portion of this encounter, including complete performance of at least one of the key components, in conjunction with the APP. I agree with the APP's note, impression, and recommendations with my edits.   Patient was taken down to the endoscopy unit today in anticipation of EGD.  Unfortunately, no functioning IV line and she is a difficult stick.  Unable to give IV fluids, potassium repletion required for sedation/endoscopy, and patient returned to the floor for medical optimization prior to proceeding with EGD.  - Recommend PICC line placement - Consult Nutrition Service.  If patient continues to decline food, may need to consider alternate means of nutrition in the coming days.  Ideally, would still utilize GI track with Dobbhoff placement pending patient tolerance, with TPN as alternate - When IV reestablished, will need potassium supplement, D5, and maintenance IV fluids - Again try to encourage eating this afternoon - N.p.o. at midnight with repeat attempt at EGD tomorrow - Repeat BMP check in the morning with electrolyte repletion as needed  Finnis Colee, DO, FACG [phone] office          Progress Note   Subjective   Day # 6 CC;failure to thrive, nausea/ vomiting  Abdominal ultrasound yesterday-confirmed no gallstones or gallbladder wall thickening CBD 2 mm, no focal liver lesions normal-sized spleen  K 2.8/BUN 5/creat 1.06 Mg 1.4  Patient's aunt is at bedside, has been spending the night says she does not want to leave her alone.  Says she has been mostly sleeping, still not eating or drinking anything Blood sugar was low this morning at 60   Objective   Vital signs in last 24 hours: Temp:  [97.5 F (36.4 C)-98.1 F (36.7 C)] 97.5 F (36.4 C) (03/26  0411) Pulse Rate:  [67-88] 67 (03/26 0805) Resp:  [16-20] 18 (03/26 0411) BP: (82-135)/(61-93) 92/61 (03/26 0805) SpO2:  [97 %-100 %] 99 % (03/26 0805) Last BM Date : 11/06/23 General:    Older African-American female female in NAD,, arouses to exam and voice but does not answer questions Heart:  Regular rate and rhythm; no murmurs Lungs: Respirations even and unlabored, lungs CTA bilaterally Abdomen:  Soft, no focal tenderness, no guarding or rebound nondistended. Normal bowel sounds. Extremities:  Without edema. Neurologic: Somnolent Psych:  Cooperative.   Intake/Output from previous day: 03/25 0701 - 03/26 0700 In: 787.1 [I.V.:71.1; IV Piggyback:716] Out: 300 [Urine:300] Intake/Output this shift: Total I/O In: 32.9 [I.V.:1.1; IV Piggyback:31.8] Out: -   Lab Results: Recent Labs    11/06/23 0616  WBC 3.3*  HGB 11.1*  HCT 32.7*  PLT 152   BMET Recent Labs    11/06/23 0616 11/06/23 1534 11/07/23 0638  NA 136 136 138  K 2.9* 3.1* 2.8*  CL 101 104 104  CO2 21* 22 22  GLUCOSE 91 79 69*  BUN <5* <5* <5*  CREATININE 1.02* 1.07* 1.06*  CALCIUM 9.0 8.5* 8.8*   LFT No results for input(s): "PROT", "ALBUMIN", "AST", "ALT", "ALKPHOS", "BILITOT", "BILIDIR", "IBILI" in the last 72 hours. PT/INR No results for input(s): "LABPROT", "INR" in the last 72 hours.  Studies/Results: US Abdomen Complete Result Date: 11/06/2023 CLINICAL DATA:  Acute abdominal pain. EXAM: ABDOMEN ULTRASOUND COMPLETE COMPARISON:  November 03, 2023. FINDINGS: Gallbladder: No gallstones or wall  thickening visualized. No sonographic Murphy sign noted by sonographer. Common bile duct: Diameter: 2 mm which is within normal limits. Liver: No focal lesion identified. Within normal limits in parenchymal echogenicity. Portal vein is patent on color Doppler imaging with normal direction of blood flow towards the liver. IVC: No abnormality visualized. Pancreas: Not visualized. Spleen: Size and appearance within  normal limits. Right Kidney: Length: 10.4 cm. Echogenicity within normal limits. No mass or hydronephrosis visualized. Left Kidney: Length: 9.9 cm. Echogenicity within normal limits. No mass or hydronephrosis visualized. Abdominal aorta: No aneurysm visualized. Other findings: None. IMPRESSION: Pancreas not visualized. No definite abnormality seen in the abdomen otherwise. Electronically Signed   By: Lupita Raider M.D.   On: 11/06/2023 20:02       Assessment / Plan:    #70 53 year old female with cerebral palsy admitted 6 days ago with 2 to 3-day history of nausea and vomiting and refusal of oral intake.  CT unremarkable Abdominal ultrasound yesterday negative  Patient is now on IV fluconazole for potential oral pharyngeal thrush  She has not had any improvement over the past couple of days still not taking any p.o.'s somnolent in part likely secondary to meds  Etiology of her symptoms is still not entirely clear and as she has not had any improvement and still not taking any p.o.'s we will plan to go ahead with EGD to rule out severe esophagitis, gastropathy, ulcer disease, versus other  #2 hypokalemia-potassium back down again today, needs more aggressive replacement #3 hypomagnesemia-replace #4 hypoglycemia-have started D5 half-normal saline this morning  #5 oral thrush-on IV Diflucan  #6  Seizure disorder  Plan; keep n.p.o. this morning Discussed EGD with patient's aunt and guardian who is at bedside.  She is in favor of proceeding with any interventions that we feel are indicated. Will plan for EGD later today with Dr. Barron Alvine. Potassium will need to be above 3 4 sedation, will discuss with medicine team. Would also leave her on IV fluids as she is not taking any p.o.'s. Further recommendations pending results of EGD.  Principal Problem:   AKI (acute kidney injury) (HCC) Active Problems:   Chronic health problem   Oral thrush   Nausea  Abdominal Pain   Decreased  appetite     LOS: 3 days   Amy Esterwood PA-C 11/07/2023, 10:12 AM

## 2023-11-07 NOTE — Progress Notes (Signed)
 PT Cancellation Note  Patient Details Name: Patricia White MRN: [id number] DOB: [birth year]   Cancelled Treatment:    Reason Eval/Treat Not Completed: Patient's level of consciousness (Pt unable to keep eyes open or follow commands. Pt not able to participate meaningfully in PT at this time. Acute PT to re-attempt as able.)  Hilton Cork, PT, DPT Secure Chat Preferred  Rehab Office [phone]  Arturo Morton Brion Aliment 11/07/2023, 3:48 PM

## 2023-11-07 NOTE — Progress Notes (Addendum)
 Patient came down to endoscopy with infiltrated IV and unable to get another IV. No fluids running when picked patient up from unit. CRNA attempted x1 and endo RN x1 attempt.GI MD Cirigliano said needs to be better optimized with d50, K+ replaced, mag, etc.  Report called back to bedside RN Candace. Patient returning back to unit with IV team consult.   Patient to return to endo tomorrow

## 2023-11-07 NOTE — Progress Notes (Incomplete)
 Initial Nutrition Assessment  DOCUMENTATION CODES:   Severe malnutrition in context of acute illness/injury  INTERVENTION:   Ensure Enlive po BID, each supplement provides 350 kcal and 20 grams of protein. Magic cup TID with meals, each supplement provides 290 kcal and 9 grams of protein 100 mg Thiamine daily MVI with minerals daily  Monitor PO intake and diet advancement  Monitor magnesium, potassium, and phosphorus daily for at least 3 days, MD to replete as needed, as pt is at risk for refeeding syndrome   If intake and/or appetite does not improve, recommend cortrak placement tomorrow as patient has gone 14 days without nutrition and qualifies for severe malnutrition  NUTRITION DIAGNOSIS:   Severe Malnutrition related to acute illness as evidenced by mild fat depletion, energy intake < or equal to 50% for > or equal to 5 days, moderate muscle depletion.  GOAL:   Patient will meet greater than or equal to 90% of their needs  MONITOR:   PO intake, Supplement acceptance, Diet advancement, Labs, I & O's  REASON FOR ASSESSMENT:   Consult Assessment of nutrition requirement/status  ASSESSMENT:  53 y.o female with PMH of CP, seizures, L hemiparesis, HTN, T2DM, HLD. Presented with decreased appetite, nausea, vomiting, and abdominal pain.  Found to have gastritis and FTT.  Aunt at bedside, patient just came back from EGD.   Patient lives with aunt who makes her meals for her. Patient is ambulatory at home. Aunt reports patient has not been eating for 1 week prior to admission, unsure of the cause. Did mention she was having nausea and vomiting during this time. While admitted she also reports patient has not been interested in food. Before getting sick patient had a very good appetite with no issues with eating per aunt. Feeds herself. Has lost 8 lbs, 5% in 3 months.   EGD revealed gastritis. MD to use PPI and sucralfae to manage in hopes helping inflammation will increase  appetite. Patient also noted to have thrush which may be limiting appetite as well.   Patient has been without nutrition for 14 days and on exam had mild fat and moderate muscle wasting mostly in her lower extremities. MD advanced diet to full liquids and will monitor for advancement. Encouraged aunt to promote PO intake and encouraged Ensures. Aunt agreeable to magic cups. Placed patients lunch order.   Patient is severely malnourished and has gone 14 days without eating, if patient does not do well with PO intake today, recommend cortrak placement tomorrow. Patient will be at severe refeeding risk, monitor lytes, and continue Thiamine.   Has been hypoglycemic with low mag and potassium. MD repleting. On D5.   Typical Dietary recall: Breakfast: eggs with bacon, or cereal  Lunch: sandwich  Dinner: fried chicken Snacks: chips Drinks: soda  Admit weight: 75.4 kg Current weight: 75.4 kg    Average Meal Intake: No meals recorded   Nutritionally Relevant Medications: Scheduled Meds:  feeding supplement  237 mL Oral BID BM   multivitamin with minerals  1 tablet Oral Daily   pantoprazole (PROTONIX) IV  40 mg Intravenous Daily   polyethylene glycol  17 g Oral Daily   senna-docusate  1 tablet Oral QHS   thiamine  100 mg Oral Daily   Continuous Infusions:  dextrose 5 % and 0.45 % NaCl 100 mL/hr at 11/07/23 2030   dextrose 5 % and 0.45 % NaCl with KCl 20 mEq/L     fluconazole (DIFLUCAN) IV 200 mg (11/08/23 0857)   levETIRAcetam  500 mg (11/08/23 9562)   valproate sodium 250 mg (11/08/23 0848)   Labs Reviewed: BUN <5, calcium 8.5, Magnesium 1.5, CBG ranges from 52-102 mg/dL over the last 24 hours HgbA1c 5 (2023)  NUTRITION - FOCUSED PHYSICAL EXAM:  Flowsheet Row Most Recent Value  Orbital Region Mild depletion  Upper Arm Region No depletion  Thoracic and Lumbar Region No depletion  Buccal Region Mild depletion  Temple Region Mild depletion  Clavicle Bone Region Mild depletion   Clavicle and Acromion Bone Region Mild depletion  Scapular Bone Region No depletion  Dorsal Hand No depletion  Patellar Region Moderate depletion  Anterior Thigh Region Moderate depletion  Posterior Calf Region Moderate depletion       Diet Order:   Diet Order             Diet full liquid Room service appropriate? Yes with Assist; Fluid consistency: Thin  Diet effective now                   EDUCATION NEEDS:   Education needs have been addressed  Skin:  Skin Assessment: Reviewed RN Assessment  Last BM:  11/04/23 type 6, small  Height:   Ht Readings from Last 1 Encounters:  11/08/23 5\' 3"  (1.6 m)    Weight:   Wt Readings from Last 1 Encounters:  11/08/23 75.4 kg    Ideal Body Weight:  52.3 kg  BMI:  Body mass index is 29.45 kg/m.  Estimated Nutritional Needs:   Kcal:  1800-2000 kcal  Protein:  90-110 gm  Fluid:  >1.8L/day   Elliot Dally, RD Registered Dietitian  See Amion for more information

## 2023-11-07 NOTE — Progress Notes (Signed)
 OT Cancellation Note  Patient Details Name: Patricia White MRN: [id number] DOB: 09-Jul-1971   Cancelled Treatment:    Reason Eval/Treat Not Completed: Patient at procedure or test/ unavailable (Pt having sterile procedure in room upon OT arrival. OT will follow-up with pt as able.)  11/07/2023  AB, OTR/L  Acute Rehabilitation Services  Office: [phone]   Tristan Schroeder 11/07/2023, 5:24 PM

## 2023-11-07 NOTE — Assessment & Plan Note (Addendum)
 Unclear if resolved, cannot see in patient's mouth, she is not using oral nystatin. May be contributing to loss of appetite.  - Fluconazole 200mg  IV (decreased today to make it easier with other IV meds)

## 2023-11-07 NOTE — Assessment & Plan Note (Addendum)
 Unclear etiology, has been going on since admission. Will consult RD today given it has now been several days of not eating. GI planning endoscopy hopefully tomorrow with electrolytes optimized.  - GI following - RD consulted, appreciate recs - D10 IV at 172ml/h - CBG checks to monitor for hypoglycemia

## 2023-11-07 NOTE — Progress Notes (Signed)
 Peripherally Inserted Central Catheter Placement  The IV Nurse has discussed with the patient and/or persons authorized to consent for the patient, the purpose of this procedure and the potential benefits and risks involved with this procedure.  The benefits include less needle sticks, lab draws from the catheter, and the patient may be discharged home with the catheter. Risks include, but not limited to, infection, bleeding, blood clot (thrombus formation), and puncture of an artery; nerve damage and irregular heartbeat and possibility to perform a PICC exchange if needed/ordered by physician.  Alternatives to this procedure were also discussed.  Bard Power PICC patient education guide, fact sheet on infection prevention and patient information card has been provided to patient /or left at bedside.    PICC Placement Documentation  PICC Double Lumen 11/07/23 Right Basilic 38 cm 0 cm (Active)  Indication for Insertion or Continuance of Line Poor Vasculature-patient has had multiple peripheral attempts or PIVs lasting less than 24 hours 11/07/23 1729  Exposed Catheter (cm) 0 cm 11/07/23 1729  Site Assessment Clean, Dry, Intact 11/07/23 1729  Lumen #1 Status Flushed;Saline locked;Blood return noted 11/07/23 1729  Lumen #2 Status Flushed;Saline locked;Blood return noted 11/07/23 1729  Dressing Type Transparent;Securing device 11/07/23 1729  Dressing Status Antimicrobial disc/dressing in place;Clean, Dry, Intact 11/07/23 1729  Line Care Connections checked and tightened 11/07/23 1729  Line Adjustment (NICU/IV Team Only) No 11/07/23 1729  Dressing Intervention New dressing;Adhesive placed at insertion site (IV team only);Other (Comment) 11/07/23 1729  Dressing Change Due 11/14/23 11/07/23 1729    Patient's Aunt, Patricia White, signed consent at bedside, prior to PICC insertion. RFA already swollen from PIV infiltration prior to insertion.   Annett Fabian 11/07/2023, 5:31 PM

## 2023-11-07 NOTE — Assessment & Plan Note (Signed)
 Had initially resolved, but 2.8 this morning. Mag 1.4 - IV Potassium q1h x5 - IV Magnesium 2g - repeat BMP this afternoon

## 2023-11-07 NOTE — Assessment & Plan Note (Signed)
>>  ASSESSMENT AND PLAN FOR NAUSEA  ABDOMINAL PAIN WRITTEN ON 11/07/2023 11:32 AM BY Para March, DO  Unclear etiology, pt still intermittently with abdominal pain and refusing PO intake. Planning for possible EGD tomorrow with GI, unable to be done today as IV was not working and electrolytes not optimal.  - GI consulted, appreciate recommendations - Palliative care following, appreciate recommendations - RD consulted, appreciate recommendations - Tylenol 650 mg every 6 hours as needed - Zofran 4 mg every 6 hours as needed - Protonix 40 mg IV daily - PT OT eval - Caution QTc prolonging agents - CBG check q4h to monitor hypoglycemia

## 2023-11-07 NOTE — H&P (View-Only) (Signed)
 Patient ID: Patricia White, female   DOB: 1971-03-25, 53 y.o.   MRN: [id number]     Attending physician's note   I have taken a history, reviewed the chart, and examined the patient. I performed a substantive portion of this encounter, including complete performance of at least one of the key components, in conjunction with the APP. I agree with the APP's note, impression, and recommendations with my edits.   Patient was taken down to the endoscopy unit today in anticipation of EGD.  Unfortunately, no functioning IV line and she is a difficult stick.  Unable to give IV fluids, potassium repletion required for sedation/endoscopy, and patient returned to the floor for medical optimization prior to proceeding with EGD.  - Recommend PICC line placement - Consult Nutrition Service.  If patient continues to decline food, may need to consider alternate means of nutrition in the coming days.  Ideally, would still utilize GI track with Dobbhoff placement pending patient tolerance, with TPN as alternate - When IV reestablished, will need potassium supplement, D5, and maintenance IV fluids - Again try to encourage eating this afternoon - N.p.o. at midnight with repeat attempt at EGD tomorrow - Repeat BMP check in the morning with electrolyte repletion as needed  Finnis Colee, DO, FACG [phone] office          Progress Note   Subjective   Day # 6 CC;failure to thrive, nausea/ vomiting  Abdominal ultrasound yesterday-confirmed no gallstones or gallbladder wall thickening CBD 2 mm, no focal liver lesions normal-sized spleen  K 2.8/BUN 5/creat 1.06 Mg 1.4  Patient's aunt is at bedside, has been spending the night says she does not want to leave her alone.  Says she has been mostly sleeping, still not eating or drinking anything Blood sugar was low this morning at 60   Objective   Vital signs in last 24 hours: Temp:  [97.5 F (36.4 C)-98.1 F (36.7 C)] 97.5 F (36.4 C) (03/26  0411) Pulse Rate:  [67-88] 67 (03/26 0805) Resp:  [16-20] 18 (03/26 0411) BP: (82-135)/(61-93) 92/61 (03/26 0805) SpO2:  [97 %-100 %] 99 % (03/26 0805) Last BM Date : 11/06/23 General:    Older African-American female female in NAD,, arouses to exam and voice but does not answer questions Heart:  Regular rate and rhythm; no murmurs Lungs: Respirations even and unlabored, lungs CTA bilaterally Abdomen:  Soft, no focal tenderness, no guarding or rebound nondistended. Normal bowel sounds. Extremities:  Without edema. Neurologic: Somnolent Psych:  Cooperative.   Intake/Output from previous day: 03/25 0701 - 03/26 0700 In: 787.1 [I.V.:71.1; IV Piggyback:716] Out: 300 [Urine:300] Intake/Output this shift: Total I/O In: 32.9 [I.V.:1.1; IV Piggyback:31.8] Out: -   Lab Results: Recent Labs    11/06/23 0616  WBC 3.3*  HGB 11.1*  HCT 32.7*  PLT 152   BMET Recent Labs    11/06/23 0616 11/06/23 1534 11/07/23 0638  NA 136 136 138  K 2.9* 3.1* 2.8*  CL 101 104 104  CO2 21* 22 22  GLUCOSE 91 79 69*  BUN <5* <5* <5*  CREATININE 1.02* 1.07* 1.06*  CALCIUM 9.0 8.5* 8.8*   LFT No results for input(s): "PROT", "ALBUMIN", "AST", "ALT", "ALKPHOS", "BILITOT", "BILIDIR", "IBILI" in the last 72 hours. PT/INR No results for input(s): "LABPROT", "INR" in the last 72 hours.  Studies/Results: US Abdomen Complete Result Date: 11/06/2023 CLINICAL DATA:  Acute abdominal pain. EXAM: ABDOMEN ULTRASOUND COMPLETE COMPARISON:  November 03, 2023. FINDINGS: Gallbladder: No gallstones or wall  thickening visualized. No sonographic Murphy sign noted by sonographer. Common bile duct: Diameter: 2 mm which is within normal limits. Liver: No focal lesion identified. Within normal limits in parenchymal echogenicity. Portal vein is patent on color Doppler imaging with normal direction of blood flow towards the liver. IVC: No abnormality visualized. Pancreas: Not visualized. Spleen: Size and appearance within  normal limits. Right Kidney: Length: 10.4 cm. Echogenicity within normal limits. No mass or hydronephrosis visualized. Left Kidney: Length: 9.9 cm. Echogenicity within normal limits. No mass or hydronephrosis visualized. Abdominal aorta: No aneurysm visualized. Other findings: None. IMPRESSION: Pancreas not visualized. No definite abnormality seen in the abdomen otherwise. Electronically Signed   By: Lupita Raider M.D.   On: 11/06/2023 20:02       Assessment / Plan:    #70 53 year old female with cerebral palsy admitted 6 days ago with 2 to 3-day history of nausea and vomiting and refusal of oral intake.  CT unremarkable Abdominal ultrasound yesterday negative  Patient is now on IV fluconazole for potential oral pharyngeal thrush  She has not had any improvement over the past couple of days still not taking any p.o.'s somnolent in part likely secondary to meds  Etiology of her symptoms is still not entirely clear and as she has not had any improvement and still not taking any p.o.'s we will plan to go ahead with EGD to rule out severe esophagitis, gastropathy, ulcer disease, versus other  #2 hypokalemia-potassium back down again today, needs more aggressive replacement #3 hypomagnesemia-replace #4 hypoglycemia-have started D5 half-normal saline this morning  #5 oral thrush-on IV Diflucan  #6  Seizure disorder  Plan; keep n.p.o. this morning Discussed EGD with patient's aunt and guardian who is at bedside.  She is in favor of proceeding with any interventions that we feel are indicated. Will plan for EGD later today with Dr. Barron Alvine. Potassium will need to be above 3 4 sedation, will discuss with medicine team. Would also leave her on IV fluids as she is not taking any p.o.'s. Further recommendations pending results of EGD.  Principal Problem:   AKI (acute kidney injury) (HCC) Active Problems:   Chronic health problem   Oral thrush   Nausea  Abdominal Pain   Decreased  appetite     LOS: 3 days   Amy Esterwood PA-C 11/07/2023, 10:12 AM

## 2023-11-07 NOTE — Assessment & Plan Note (Addendum)
 Creatinine continues to improve, stable at 1.06 this morning.  - D10 at 100 mL/h - AM BMP, mag

## 2023-11-07 NOTE — Care Management Important Message (Signed)
 Important Message  Patient Details  Name: Patricia White MRN: [id number] Date of Birth: Apr 01, 1971   Important Message Given:  Yes - Medicare IM     Dorena Bodo 11/07/2023, 2:43 PM

## 2023-11-07 NOTE — Progress Notes (Signed)
   Palliative Medicine Inpatient Follow Up Note   HPI: 53 year old female with past medical history cerebral palsy, GERD, seizures, hypertension, hyperlipidemia admitted for AKI, also with decreased appetite, nausea, vomiting, abdominal pain.    Palliative care has been asked to support additional goals of care conversations.     Today's Discussion 11/07/2023  *Please note that this is a verbal dictation therefore any spelling or grammatical errors are due to the "Dragon Medical One" system interpretation.  Chart reviewed inclusive of vital signs, progress notes, laboratory results, and diagnostic images.   I entered the room as GI was performing an evaluation. They shared the plan for EGD this late morning to identify why Amberlynn is not eating or drinking.   I met with Bonnee and her aunt, [name] this morning. Kaden was sleepy but arousable. She was minimal in terms of verbal responses.  We were able to call Jesslynn's grandmother, [name] on speakerphone. As was done yesterday discussions related to best case and worst case scenarios were completed. We reviewed the plan for EGD later in the day and the hope that this would be revealing.  We discussed CPR, shocks, and intubation in great detail. After providing information regarding the possible trauma associated with all actions patients family has determined that Hailie would not want this.   Created space and opportunity for patient family to explore thoughts feelings and fears regarding Zakaria's current medical situation. They are at this point hopeful to get to the bottom of her sudden loss in appetite. Patients grandmother shares that she did have a cold preceding her loss of appetite. I shared often after a few days patients recover from such events and thus far we have not noted this in [place].   Questions and concerns addressed/Palliative Support Provided.   Objective Assessment: Vital Signs Vitals:   11/07/23 0805  11/07/23 1047  BP: 92/61 99/80  Pulse: 67   Resp:  18  Temp:  (!) 97.3 F (36.3 C)  SpO2: 99% 97%    Intake/Output Summary (Last 24 hours) at 11/07/2023 1449 Last data filed at 11/07/2023 0702 Gross per 24 hour  Intake 819.99 ml  Output 300 ml  Net 519.99 ml   Last Weight  Most recent update: 11/07/2023 10:59 AM    Weight  75.4 kg (166 lb 3.6 oz)            Gen: Elderly middle aged AA F chronically ill appearing HEENT: Dry mucous membranes, poor dentition, (+) white patches on tongue CV: Regular rate and rhythm  PULM: On RA, breathing even and non-labored ABD: Abdominal tenderness with deep palpation EXT: No edema  Neuro: Alert to self  SUMMARY OF RECOMMENDATIONS   DNAR/DNI   Continue medical workup allowing time for outcomes   Open and honest conversations held emphasizing best case and worst case scenarios   Ongoing PMT support  Time Spent: 34 Billing based on MDM: High ______________________________________________________________________________________ Lamarr Lulas [place] Palliative Medicine Team Team Cell Phone: [phone] Please utilize secure chat with additional questions, if there is no response within 30 minutes please call the above phone number  Palliative Medicine Team providers are available by phone from 7am to 7pm daily and can be reached through the team cell phone.  Should this patient require assistance outside of these hours, please call the patient's attending physician.

## 2023-11-07 NOTE — Progress Notes (Signed)
 Daily Progress Note Intern Pager: [phone]  Patient name: Patricia White Medical record number: [id number] Date of birth: 09/11/1970 Age: 53 y.o. Gender: female  Primary Care Provider: Celine Mans, MD Consultants: GI, palliative care Code Status: DNR  Pt Overview and Major Events to Date:  3/20 - Admitted 3/24 - Rapid response called (choked on medication)  Assessment and Plan:  53yo female with PMHx CP, GERD, seizures, HTN, HLD admitted for AKI and decreased appetite with abdominal pain.  GI had planned for EGD today, however pt was found to have non-working IV when taken to endoscopy suite. Did not receive IV potassium or dextrose prior to going down. Will plan for possible PICC today so pt can get her electrolytes, fluid, and other medications. Also will place RD consult as she has gone several days without eating. Assessment & Plan AKI (acute kidney injury) (HCC) Creatinine continues to improve, stable at 1.06 this morning.  - D10 at 100 mL/h - AM BMP, mag Nausea  Abdominal Pain Unclear etiology, pt still intermittently with abdominal pain and refusing PO intake. Planning for possible EGD tomorrow with GI, unable to be done today as IV was not working and electrolytes not optimal.  - GI consulted, appreciate recommendations - Palliative care following, appreciate recommendations - RD consulted, appreciate recommendations - Tylenol 650 mg every 6 hours as needed - Zofran 4 mg every 6 hours as needed - Protonix 40 mg IV daily - PT OT eval - Caution QTc prolonging agents - CBG check q4h to monitor hypoglycemia Decreased appetite Unclear etiology, has been going on since admission. Will consult RD today given it has now been several days of not eating. GI planning endoscopy hopefully tomorrow with electrolytes optimized.  - GI following - RD consulted, appreciate recs - D10 IV at 173ml/h - CBG checks to monitor for hypoglycemia Oral thrush Unclear if resolved,  cannot see in patient's mouth, she is not using oral nystatin. May be contributing to loss of appetite.  - Fluconazole 200mg  IV (decreased today to make it easier with other IV meds) Hypokalemia Had initially resolved, but 2.8 this morning. Mag 1.4 - IV Potassium q1h x5 - IV Magnesium 2g - repeat BMP this afternoon  Chronic and Stable Problems:  GERD - stable, continue protonix 40mg  IV Seizures - stable, IV Depakote 500mg  BID, IV Keppra 100mg  BID. Unable to take Tegretol and Topamax as she is unable to tolerate PO. HTN - holding home BP meds, stable HLD - on Pravastatin 40mg  at home, unable to take here Constipation - stable, LBM 11/05/33   FEN/GI: NPO pending EGD PPx: Lovenox Dispo:Home pending clinical improvement    Subjective:  NAEON. Aunt states she is about the same. Pt will awake and look at me but not verbalize anything.   Objective: Temp:  [97.5 F (36.4 C)-98.1 F (36.7 C)] 97.5 F (36.4 C) (03/26 0411) Pulse Rate:  [67-88] 67 (03/26 0805) Resp:  [16-20] 18 (03/26 0411) BP: (82-135)/(61-93) 92/61 (03/26 0805) SpO2:  [97 %-100 %] 99 % (03/26 0805) Physical Exam: General: Sleeping but arousable to voice. Cardiovascular: RRR, no murmurs Respiratory: Breathing comfortably on RA, CTAB Abdomen: Soft, non tender on my palpation. Bowel sounds present.  Laboratory: Most recent CBC Lab Results  Component Value Date   WBC 3.3 (L) 11/06/2023   HGB 11.1 (L) 11/06/2023   HCT 32.7 (L) 11/06/2023   MCV 90.6 11/06/2023   PLT 152 11/06/2023   Most recent BMP    Latest  Ref Rng & Units 11/07/2023    6:38 AM  BMP  Glucose 70 - 99 mg/dL 69   BUN 6 - 20 mg/dL <5   Creatinine 7.84 - 1.00 mg/dL 6.96   Sodium 295 - 284 mmol/L 138   Potassium 3.5 - 5.1 mmol/L 2.8   Chloride 98 - 111 mmol/L 104   CO2 22 - 32 mmol/L 22   Calcium 8.9 - 10.3 mg/dL 8.8     Imaging/Diagnostic Tests: US Abdomen Complete 11/06/23 IMPRESSION: Pancreas not visualized. No definite abnormality  seen in the abdomen otherwise.  Tory Mckissack, DO 11/07/2023, 10:28 AM  PGY-1, [facility] FPTS Intern pager: [phone], text pages welcome Secure chat group [facility] Teaching Service

## 2023-11-07 NOTE — Plan of Care (Signed)

## 2023-11-08 ENCOUNTER — Encounter (HOSPITAL_COMMUNITY): Payer: Self-pay | Admitting: Family Medicine

## 2023-11-08 ENCOUNTER — Encounter (HOSPITAL_COMMUNITY)
Admission: EM | Disposition: A | Discharge status: Home Health | Payer: Self-pay | Source: Ambulatory Visit | Attending: Family Medicine

## 2023-11-08 ENCOUNTER — Inpatient Hospital Stay (HOSPITAL_COMMUNITY): Admitting: Anesthesiology

## 2023-11-08 DIAGNOSIS — K2970 Gastritis, unspecified, without bleeding: Secondary | ICD-10-CM

## 2023-11-08 DIAGNOSIS — K3189 Other diseases of stomach and duodenum: Secondary | ICD-10-CM

## 2023-11-08 DIAGNOSIS — K317 Polyp of stomach and duodenum: Secondary | ICD-10-CM

## 2023-11-08 DIAGNOSIS — J45909 Unspecified asthma, uncomplicated: Secondary | ICD-10-CM

## 2023-11-08 DIAGNOSIS — I10 Essential (primary) hypertension: Secondary | ICD-10-CM

## 2023-11-08 DIAGNOSIS — E860 Dehydration: Principal | ICD-10-CM

## 2023-11-08 DIAGNOSIS — N179 Acute kidney failure, unspecified: Secondary | ICD-10-CM | POA: Diagnosis not present

## 2023-11-08 DIAGNOSIS — E43 Unspecified severe protein-calorie malnutrition: Secondary | ICD-10-CM | POA: Insufficient documentation

## 2023-11-08 DIAGNOSIS — Z66 Do not resuscitate: Secondary | ICD-10-CM

## 2023-11-08 DIAGNOSIS — R627 Adult failure to thrive: Secondary | ICD-10-CM | POA: Diagnosis not present

## 2023-11-08 DIAGNOSIS — Z7189 Other specified counseling: Secondary | ICD-10-CM | POA: Diagnosis not present

## 2023-11-08 DIAGNOSIS — R110 Nausea: Secondary | ICD-10-CM

## 2023-11-08 DIAGNOSIS — Z515 Encounter for palliative care: Secondary | ICD-10-CM | POA: Diagnosis not present

## 2023-11-08 HISTORY — PX: ESOPHAGOGASTRODUODENOSCOPY: SHX5428

## 2023-11-08 HISTORY — PX: POLYPECTOMY: SHX5525

## 2023-11-08 LAB — GLUCOSE, CAPILLARY
Glucose-Capillary: 52 mg/dL — ABNORMAL LOW (ref 70–99)
Glucose-Capillary: 74 mg/dL (ref 70–99)
Glucose-Capillary: 82 mg/dL (ref 70–99)
Glucose-Capillary: 100 mg/dL — ABNORMAL HIGH (ref 70–99)
Glucose-Capillary: 110 mg/dL — ABNORMAL HIGH (ref 70–99)
Glucose-Capillary: 92 mg/dL (ref 70–99)
Glucose-Capillary: 104 mg/dL — ABNORMAL HIGH (ref 70–99)

## 2023-11-08 LAB — CBC
HCT: 29.8 % — ABNORMAL LOW (ref 36.0–46.0)
Hemoglobin: 9.7 g/dL — ABNORMAL LOW (ref 12.0–15.0)
MCH: 30.7 pg (ref 26.0–34.0)
MCHC: 32.6 g/dL (ref 30.0–36.0)
MCV: 94.3 fL (ref 80.0–100.0)
Platelets: 101 10*3/uL — ABNORMAL LOW (ref 150–400)
RBC: 3.16 MIL/uL — ABNORMAL LOW (ref 3.87–5.11)
RDW: 12.9 % (ref 11.5–15.5)
WBC: 4.1 10*3/uL (ref 4.0–10.5)
nRBC: 0 % (ref 0.0–0.2)

## 2023-11-08 LAB — BASIC METABOLIC PANEL WITH GFR
Anion gap: 9 (ref 5–15)
BUN: <5 mg/dL — ABNORMAL LOW (ref 6–20)
CO2: 20 mmol/L — ABNORMAL LOW (ref 22–32)
Calcium: 8.5 mg/dL — ABNORMAL LOW (ref 8.9–10.3)
Chloride: 106 mmol/L (ref 98–111)
Creatinine, Ser: 0.98 mg/dL (ref 0.44–1.00)
GFR, Estimated: >60 mL/min (ref >60)
Glucose, Bld: 91 mg/dL (ref 70–99)
Potassium: 3.5 mmol/L (ref 3.5–5.1)
Sodium: 135 mmol/L (ref 135–145)

## 2023-11-08 LAB — MAGNESIUM: Magnesium: 1.5 mg/dL — ABNORMAL LOW (ref 1.7–2.4)

## 2023-11-08 SURGERY — EGD (ESOPHAGOGASTRODUODENOSCOPY)
Anesthesia: Monitor Anesthesia Care

## 2023-11-08 MED ORDER — PHENYLEPHRINE HCL (PRESSORS) 10 MG/ML IV SOLN
INTRAVENOUS | Status: DC | PRN
Start: 2023-11-08 — End: 2023-11-08
  Administered 2023-11-08: 80 ug via INTRAVENOUS
  Administered 2023-11-08 (×2): 200 ug via INTRAVENOUS
  Administered 2023-11-08 (×2): 160 ug via INTRAVENOUS

## 2023-11-08 MED ORDER — MAGNESIUM SULFATE 2 GM/50ML IV SOLN
2.0000 g | Freq: Once | INTRAVENOUS | Status: AC
  Administered 2023-11-08: 2 g via INTRAVENOUS
  Filled 2023-11-08: qty 50

## 2023-11-08 MED ORDER — SODIUM CHLORIDE 0.9 % IV SOLN: INTRAVENOUS | Status: DC | PRN

## 2023-11-08 MED ORDER — EPHEDRINE SULFATE (PRESSORS) 50 MG/ML IJ SOLN
INTRAMUSCULAR | Status: DC | PRN
  Administered 2023-11-08 (×2): 10 mg via INTRAVENOUS

## 2023-11-08 MED ORDER — KCL IN DEXTROSE-NACL 20-5-0.45 MEQ/L-%-% IV SOLN
INTRAVENOUS | Status: AC
  Filled 2023-11-08 (×3): qty 1000

## 2023-11-08 MED ORDER — DEXTROSE 50 % IV SOLN
1.0000 | INTRAVENOUS | Status: DC | PRN
  Administered 2023-11-08 – 2023-11-09 (×3): 50 mL via INTRAVENOUS
  Filled 2023-11-08 (×3): qty 50

## 2023-11-08 MED ORDER — PROPOFOL 10 MG/ML IV BOLUS
INTRAVENOUS | Status: DC | PRN
  Administered 2023-11-08: 40 mg via INTRAVENOUS

## 2023-11-08 MED ORDER — SUCRALFATE 1 GM/10ML PO SUSP
1.0000 g | Freq: Three times a day (TID) | ORAL | Status: DC
  Administered 2023-11-08 – 2023-11-12 (×14): 1 g via ORAL
  Filled 2023-11-08 (×15): qty 10

## 2023-11-08 MED ORDER — PROPOFOL 500 MG/50ML IV EMUL
INTRAVENOUS | Status: DC | PRN
  Administered 2023-11-08: 140 ug/kg/min via INTRAVENOUS

## 2023-11-08 NOTE — Assessment & Plan Note (Addendum)
 Unable to see in patient's mouth to assess if this has resolved. - Diflucan 200 mg IV daily

## 2023-11-08 NOTE — TOC Progression Note (Signed)
 Transition of Care [facility]) - Progression Note    Patient Details  Name: Patricia White MRN: [id number] Date of Birth: [birth year]  Transition of [facility] CM/SW Contact  Janae Bridgeman, RN Phone Number: 11/08/2023, 2:16 PM  Clinical Narrative:    CM met with the patient's aunt at the bedside since the patient is unable to participate in her care decisions.  Patient is currently sleeping post-GI procedure.  The patient's aunt was offered Medicare choice regarding home health agency and Eather Colas, aunt did not have a preference.  I called Artavia, RNCM with [facility] and she accepted for [facility] PT/OT.  HH orders placed to be co-signed by attending team.  Patient was recently seen by dietitian - note in - patient may likely need Cortrak placed if she is not able to improve po intake her dietitian note.  Patient will remain inpatient until po intake resolved.   CM will continue to follow the patient for TOC needs.     Expected Discharge Plan: Home w Home Health Services Barriers to Discharge: Continued Medical Work up  Expected Discharge Plan and Services     Post Acute Care Choice: Home Health Living arrangements for the past 2 months: Single Family Home                                       Social Determinants of Health (SDOH) Interventions SDOH Screenings   Food Insecurity: No Food Insecurity (11/01/2023)  Housing: Low Risk  (11/01/2023)  Transportation Needs: No Transportation Needs (11/01/2023)  Utilities: Not At Risk (11/01/2023)  Alcohol Screen: Low Risk  (03/24/2023)  Depression (PHQ2-9): Low Risk  (08/09/2023)  Financial Resource Strain: Low Risk  (03/24/2023)  Physical Activity: Insufficiently Active (03/24/2023)  Social Connections: Moderately Isolated (03/24/2023)  Stress: No Stress Concern Present (03/24/2023)  Tobacco Use: Low Risk  (11/08/2023)  Health Literacy: Adequate Health Literacy (03/24/2023)    Readmission Risk Interventions     No data to display

## 2023-11-08 NOTE — Progress Notes (Signed)
 Daily Progress Note   Patient Name: Patricia White       Date: 11/08/2023 DOB: 1971-05-09  Age: 53 y.o. MRN#: [id number] Attending Physician: Westley Chandler, MD Primary Care Physician: Celine Mans, MD Admit Date: 11/01/2023  Reason for Consultation/Follow-up: Establishing goals of care  Subjective: Patient not interactive, aunt at bedside reports no improvement in mental status  Length of Stay: 4  Current Medications: Scheduled Meds:   atorvastatin  40 mg Oral Daily   Chlorhexidine Gluconate Cloth  6 each Topical Daily   enoxaparin (LOVENOX) injection  40 mg Subcutaneous Q24H   famotidine  20 mg Oral BID   feeding supplement  237 mL Oral BID BM   multivitamin with minerals  1 tablet Oral Daily   pantoprazole (PROTONIX) IV  40 mg Intravenous Daily   polyethylene glycol  17 g Oral Daily   senna-docusate  1 tablet Oral QHS   sodium chloride flush  10-40 mL Intracatheter Q12H   sucralfate  1 g Oral TID WC & HS   thiamine  100 mg Oral Daily    Continuous Infusions:  dextrose 5 % and 0.45 % NaCl 100 mL/hr at 11/07/23 2030   dextrose 5 % and 0.45 % NaCl with KCl 20 mEq/L     fluconazole (DIFLUCAN) IV 200 mg (11/08/23 0857)   levETIRAcetam 500 mg (11/08/23 0852)   valproate sodium 250 mg (11/08/23 1522)    PRN Meds: acetaminophen **OR** acetaminophen, dextrose, LORazepam, sodium chloride flush  Physical Exam Constitutional:      General: She is not in acute distress.    Appearance: She is ill-appearing.     Comments: Not interactive  Pulmonary:     Effort: Pulmonary effort is normal.  Skin:    General: Skin is warm and dry.             Vital Signs: BP 111/67 (BP Location: Right Leg)   Pulse 74   Temp (!) 97.2 F (36.2 C) (Oral)   Resp 17   Ht 5\' 3"  (1.6 m)   Wt 69 kg    SpO2 100%   BMI 26.95 kg/m  SpO2: SpO2: 100 % O2 Device: O2 Device: Room Air O2 Flow Rate:    Intake/output summary:  Intake/Output Summary (Last 24 hours) at 11/08/2023 1617 Last data filed at 11/08/2023 1600 Gross per 24 hour  Intake 1791.78 ml  Output 1150 ml  Net 641.78 ml   LBM: Last BM Date : 11/06/23 Baseline Weight: Weight: 75.4 kg Most recent weight: Weight: 69 kg       Palliative Assessment/Data: PPS 10%      Patient Active Problem List   Diagnosis Date Noted   Dehydration 11/08/2023   Gastritis and gastroduodenitis 11/08/2023   Gastric polyp 11/08/2023   Nausea without vomiting 11/08/2023   Protein-calorie malnutrition, severe 11/08/2023   Decreased appetite 11/05/2023   Chronic health problem 11/01/2023   Oral thrush 11/01/2023   FTT (failure to thrive) in adult 11/01/2023   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   Hot flashes due to menopause 06/30/2021   Hyperlipidemia associated with type 2 diabetes mellitus (HCC) 11/24/2020   Diabetes mellitus without complication (HCC) 11/23/2020   Hypokalemia 11/23/2020  Angioedema 12/10/2019   Lesion of mandible 12/10/2019   Seasonal allergies 11/20/2019   Essential hypertension 10/23/2019   Seizure disorder (HCC) 02/23/2016   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    Palliative care has been asked to support additional goals of care conversations.   RD has recommended coretrak placement d/t ongoing poor PO intake.   EGD 3/27 with moderate gastritis with a few erosions in gastric body and antrum  - on protonix and carafate.    Assessment: Follow-up today with patient's aunt who is at bedside. We review patient had EGD earlier today, reviewed results that were discussed with her earlier.  We discussed that patient's team is planning on core track placement.  Patricia White would like to move forward with this.  She shares patient's grandmother Patricia White would also want to move forward with this.  They remain hopeful for improvement in patient's condition.  We discussed a trial of this over a few days.  We discussed if patient remains in current condition we will need to have more conversation about how to proceed with care.  Patricia White expresses understanding but shares she worries patient's grandmother will have difficulty with these conversations if patient doesn't improve.  Recommendations/Plan: Trial of cortrak PMT will follow DNR/DNI  Code Status: DNR  Care plan was discussed with patient's aunt  Thank you for allowing the Palliative Medicine Team to assist in the care of this patient.   Total Time 35 minutes Prolonged Time Billed  no   Time spent includes: Detailed review of medical records (labs, imaging, vital signs), medically appropriate exam, discussion with treatment team, counseling and educating patient, family and/or staff, documenting clinical information, medication management and coordination of care.     *Please note that this is a verbal dictation therefore any spelling or grammatical errors are due to the "Dragon Medical One" system interpretation.  Gerlean Ren, DNP, [facility] Palliative Medicine Team Team Phone # [phone]  Pager [phone]

## 2023-11-08 NOTE — Assessment & Plan Note (Addendum)
 Resolved

## 2023-11-08 NOTE — Addendum Note (Signed)
 Addendum  created 11/08/23 1228 by Darlina Guys, CRNA   Flowsheet accepted

## 2023-11-08 NOTE — Assessment & Plan Note (Signed)
>>  ASSESSMENT AND PLAN FOR NAUSEA  ABDOMINAL PAIN WRITTEN ON 11/08/2023 11:44 AM BY Para March, DO  Unclear etiology, pt still intermittently with abdominal pain and refusing PO intake. Planning for possible EGD tomorrow with GI, unable to be done today as IV was not working and electrolytes not optimal.  - GI consulted, appreciate recommendations - Palliative care following, appreciate recommendations - RD consulted, appreciate recommendations - Tylenol 650 mg every 6 hours as needed - Zofran 4 mg every 6 hours as needed - Protonix 40 mg IV daily - PT OT eval - Caution QTc prolonging agents - CBG check q4h to monitor hypoglycemia

## 2023-11-08 NOTE — Progress Notes (Signed)
 Occupational Therapy Treatment Patient Details Name: Patricia White MRN: [id number] DOB: 04-Sep-1970 Today's Date: 11/08/2023   History of present illness 53 y/o female presents to [facility] on 11/01/23 for hypotension and dehydration in setting of 3 days of cough, nausea and vomiting. Found to have AKI, hypokalemia in ED. PMH: Infantile CP with chronic L hemiparesis, cared for by aunt, seizure disorder, moderate ID, HTN, seasonal allergies, T2DM, HLD, obesity.   OT comments  Pt continues to be limited by fatigue and lethargy, she remains generally weak and demonstrates challenge with continued offloading of BLEs. Pt was able to engage in standing with min A +2 today using stedy and progressed to HHA but only able to take ~2 steps. Pt has strong support from aunt at home and was mod I PTA. Patient has the potential to reach Mod I and demos the ability to tolerate 3 hours of therapy. Pt would benefit from an intensive rehab program to help maximize functional independence. Ot to continue following pt acutely to address deficits and progress pt as able.       If plan is discharge home, recommend the following:  A little help with bathing/dressing/bathroom;Assistance with cooking/housework;Supervision due to cognitive status;Direct supervision/assist for financial management;Direct supervision/assist for medications management   Equipment Recommendations  None recommended by OT    Recommendations for Other Services Rehab consult    Precautions / Restrictions Precautions Precautions: Fall Recall of Precautions/Restrictions: Impaired Precaution/Restrictions Comments: had a fall the night of 3/20 per AM nurse Required Braces or Orthoses: Other Brace Other Brace: LLE AFO (in room and sneakers) Restrictions Weight Bearing Restrictions Per Provider Order: No       Mobility Bed Mobility Overal bed mobility: Needs Assistance Bed Mobility: Rolling, Sidelying to Sit, Sit to Supine Rolling: Min assist,  Used rails Sidelying to sit: Max assist, +2 for physical assistance, HOB elevated, Used rails Supine to sit: Min assist, +2 for physical assistance Sit to supine: Supervision   General bed mobility comments: Increased time to intiate and perform, hand over hand assist when initiating with pt then able to utilize bed rail and elevated HOB to assist with raising her trunk. BLE maxA to slide legs toward R EOB and to advance hips to EOB fully.    Transfers Overall transfer level: Needs assistance Equipment used: 1 person hand held assist, Ambulation equipment used Transfers: Sit to/from Stand, Bed to chair/wheelchair/BSC Sit to Stand: Min assist, Mod assist, +2 physical assistance, From elevated surface, Via lift equipment           General transfer comment: EOB<>Stedy x2 with min to modA, then from elevated Stedy flaps x2 with min to CGA, then from EOB with R side rail and minA + HHA to steady her. Scooted toward [facility] from end of bed in [place] platform. Defer OOB to chair due to pt fatigue after recent procedure and multiple transfer trials. Transfer via Lift Equipment: Stedy   Balance Overall balance assessment: Needs assistance Sitting-balance support: Single extremity supported, Feet supported Sitting balance-Leahy Scale: Poor Sitting balance - Comments: impulsive anterior lean, increases with fatigue needing CGA to modA to prevent anterior LOB Postural control: Other (comment) (anterior lean) Standing balance support: Single extremity supported, Reliant on assistive device for balance Standing balance-Leahy Scale: Poor Standing balance comment: +1 assist static standing in Stedy, +2 dynamic standing tasks; only uses RUE for support due to L hemi  ADL either performed or assessed with clinical judgement   ADL Overall ADL's : Needs assistance/impaired                     Lower Body Dressing: Maximal assistance Lower Body Dressing Details  (indicate cue type and reason): don bilat socks and L AFO             Functional mobility during ADLs: Minimal assistance;+2 for physical assistance (standing efforts with stedy) General ADL Comments: Focused on progressing pt with OOB mobility/strengthening.    Extremity/Trunk Assessment Upper Extremity Assessment LUE Deficits / Details: Congential CP, L hemi, L elbow flexion/ext intact            Vision       Perception     Praxis     Communication Communication Communication: Impaired Factors Affecting Communication: Reduced clarity of speech   Cognition Arousal: Alert Behavior During Therapy: WFL for tasks assessed/performed Cognition: History of cognitive impairments             OT - Cognition Comments: 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                 Following commands: Impaired Following commands impaired: Follows one step commands with increased time, Follows one step commands inconsistently      Cueing   Cueing Techniques: Verbal cues, Gestural cues, Tactile cues, Visual cues  Exercises General Exercises - Lower Extremity Long Arc Quad: AROM, Both, 10 reps, Seated Hip Flexion/Marching: AROM, AAROM, Both, 10 reps, 5 reps, Seated, Standing, Other (comment) (AA to initiate progressing to AROM; x10 reps seated, x5 reps standing) Other Exercises Other Exercises: STS x 5 total reps (with R rail, and x2 with stedy and x2 from EOB) Other Exercises: dynamic reaching sitting EOB to promote core engagement and anterior weight shift Other Exercises: R elbow lateral lean with push into midline x2    Shoulder Instructions       General Comments Bed linens and gown changed during session due to wet bed; see BP in comments above; HR 105 bpm with standing and 92 bpm sitting EOB; SpO2 WFL on RA    Pertinent Vitals/ Pain       Pain Assessment Pain Assessment: Faces Faces Pain Scale: Hurts a little bit Pain Location: Internittent grimacing  but no localized c/o pain. Bed linens were wet so when pt stood PTA checked her bottom/lower back and no visible open areas of skin observed. Pain Descriptors / Indicators: Grimacing Pain Intervention(s): Monitored during session, Repositioned  Home Living                                          Prior Functioning/Environment              Frequency  Min 2X/week        Progress Toward Goals  OT Goals(current goals can now be found in the care plan section)  Progress towards OT goals: Progressing toward goals  Acute Rehab OT Goals Patient Stated Goal: To get stronger OT Goal Formulation: With family Time For Goal Achievement: 11/16/23 Potential to Achieve Goals: Good  Plan      Co-evaluation    PT/OT/SLP Co-Evaluation/Treatment: Yes Reason for Co-Treatment: To address functional/ADL transfers;For patient/therapist safety PT goals addressed during session: Mobility/safety with mobility;Strengthening/ROM;Balance OT goals addressed during session: ADL's and self-care;Strengthening/ROM  AM-PAC OT "6 Clicks" Daily Activity     Outcome Measure   Help from another person eating meals?: A Little Help from another person taking care of personal grooming?: A Little Help from another person toileting, which includes using toliet, bedpan, or urinal?: A Little Help from another person bathing (including washing, rinsing, drying)?: A Little Help from another person to put on and taking off regular upper body clothing?: A Lot Help from another person to put on and taking off regular lower body clothing?: A Lot 6 Click Score: 16    End of Session Equipment Utilized During Treatment: Gait belt  OT Visit Diagnosis: Unsteadiness on feet (R26.81);History of falling (Z91.81);Muscle weakness (generalized) (M62.81)   Activity Tolerance Patient tolerated treatment well   Patient Left in bed;with call bell/phone within reach;with bed alarm set;with  family/visitor present   Nurse Communication Mobility status        Time: 1610-9604 OT Time Calculation (min): 39 min  Charges: OT General Charges $OT Visit: 1 Visit OT Treatments $Therapeutic Activity: 8-22 mins  11/08/2023  AB, OTR/L  Acute Rehabilitation Services  Office: [phone]   Tristan Schroeder 11/08/2023, 4:58 PM

## 2023-11-08 NOTE — Assessment & Plan Note (Addendum)
 Intermittent throughout admission, normalized this morning. - AM BMP - Replete electrolytes as needed

## 2023-11-08 NOTE — Op Note (Signed)
 [facility] Patient Name: Patricia White Procedure Date : 11/08/2023 MRN: [id number] Attending MD: Doristine Locks , MD, [id number] Date of Birth: Aug 13, 1971 CSN: [id number] Age: 53 Admit Type: Inpatient Procedure:                Upper GI endoscopy Indications:              Generalized abdominal pain, Failure to thrive,                            Nausea Providers:                Doristine Locks, MD, Stephens Shire RN, RN, Salley Scarlet, Technician Referring MD:              Medicines:                Monitored Anesthesia Care Complications:            No immediate complications. Estimated Blood Loss:     Estimated blood loss was minimal. Procedure:                Pre-Anesthesia Assessment:                           - Prior to the procedure, a History and Physical                            was performed, and patient medications and                            allergies were reviewed. The patient's tolerance of                            previous anesthesia was also reviewed. The risks                            and benefits of the procedure and the sedation                            options and risks were discussed with the patient.                            All questions were answered, and informed consent                            was obtained. Prior Anticoagulants: The patient has                            taken no anticoagulant or antiplatelet agents. ASA                            Grade Assessment: III - A patient with severe  systemic disease. After reviewing the risks and                            benefits, the patient was deemed in satisfactory                            condition to undergo the procedure.                           After obtaining informed consent, the endoscope was                            passed under direct vision. Throughout the                            procedure, the patient's blood  pressure, pulse, and                            oxygen saturations were monitored continuously. The                            [id number]-H190 ([id number] Olympus endoscope was introduced                            through the mouth, and advanced to the second part                            of duodenum. The upper GI endoscopy was                            accomplished without difficulty. The patient                            tolerated the procedure well. Scope In: Scope Out: Findings:      The examined esophagus was normal.      Moderate inflammation characterized by congestion (edema) and erythema       was found in the gastric body and in the gastric antrum. There were a       few small, shallow erosions noted. Biopsies were taken with a cold       forceps for histology and Helicobacter pylori testing. Estimated blood       loss was minimal.      A single 6 mm sessile polyp with no stigmata of recent bleeding was       found in the cardia. The polyp was removed with a cold snare. Resection       and retrieval were complete. Estimated blood loss was minimal.      The examined duodenum was normal. Impression:               - Normal esophagus.                           - Gastritis. Biopsied.                           - A single  gastric polyp. Resected and retrieved.                           - Normal examined duodenum. Recommendation:           - Return patient to hospital ward for ongoing care.                           - Advance diet as tolerated.                           - Use Protonix (pantoprazole) 40 mg PO BID.                           - Use sucralfate suspension 1 gram PO QID.                           - Await pathology results.                           - Results discussed with patient's aunt by phone.                           - Results conveyed to primary Medicine service. Procedure Code(s):        --- Professional ---                           [phone], Esophagogastroduodenoscopy,  flexible,                            transoral; with removal of tumor(s), polyp(s), or                            other lesion(s) by snare technique                           43239, 59, Esophagogastroduodenoscopy, flexible,                            transoral; with biopsy, single or multiple Diagnosis Code(s):        --- Professional ---                           K29.70, Gastritis, unspecified, without bleeding                           K31.7, Polyp of stomach and duodenum                           R10.84, Generalized abdominal pain                           R62.7, Adult failure to thrive                           R11.0, Nausea CPT copyright 2022 [employer]. All rights reserved. The codes documented in this  report are preliminary and upon coder review may  be revised to meet current compliance requirements. Doristine Locks, MD 11/08/2023 11:10:20 AM Number of Addenda: 0

## 2023-11-08 NOTE — Assessment & Plan Note (Deleted)
 Unclear etiology, pt still intermittently with abdominal pain and refusing PO intake. Planning for possible EGD tomorrow with GI, unable to be done today as IV was not working and electrolytes not optimal.  - GI consulted, appreciate recommendations - Palliative care following, appreciate recommendations - RD consulted, appreciate recommendations - Tylenol 650 mg every 6 hours as needed - Zofran 4 mg every 6 hours as needed - Protonix 40 mg IV daily - PT OT eval - Caution QTc prolonging agents - CBG check q4h to monitor hypoglycemia

## 2023-11-08 NOTE — Anesthesia Postprocedure Evaluation (Signed)
 Anesthesia Post Note  Patient: Patricia White  Procedure(s) Performed: EGD (ESOPHAGOGASTRODUODENOSCOPY) POLYPECTOMY     Patient location during evaluation: PACU Anesthesia Type: MAC Level of consciousness: responds to stimulation and awake Pain management: pain level controlled Vital Signs Assessment: post-procedure vital signs reviewed and stable Respiratory status: spontaneous breathing, nonlabored ventilation and respiratory function stable Cardiovascular status: blood pressure returned to baseline Postop Assessment: no apparent nausea or vomiting Anesthetic complications: no   No notable events documented.  Last Vitals:  Vitals:   11/08/23 1130 11/08/23 1145  BP: (!) 98/51 (!) 98/48  Pulse: 86 90  Resp: 18 15  Temp:  (!) 36.3 C  SpO2: 98% 99%    Last Pain:  Vitals:   11/08/23 1145  TempSrc:   PainSc: 0-No pain                 Shanda Howells

## 2023-11-08 NOTE — Interval H&P Note (Signed)
 History and Physical Interval Note:  11/08/2023 10:12 AM  Patricia White  has presented today for surgery, with the diagnosis of Failure to thrive, nausea, vomiting, not eating.  The various methods of treatment have been discussed with the patient and family. After consideration of risks, benefits and other options for treatment, the patient has consented to  Procedure(s): EGD (ESOPHAGOGASTRODUODENOSCOPY) (N/A) as a surgical intervention.  The patient's history has been reviewed, patient examined, no change in status, stable for surgery.  I have reviewed the patient's chart and labs.  Questions were answered to the patient's satisfaction.     Verlin Dike Janiaya Ryser

## 2023-11-08 NOTE — Plan of Care (Signed)

## 2023-11-08 NOTE — Progress Notes (Signed)
     Daily Progress Note Intern Pager: [phone]  Patient name: Patricia White Medical record number: [id number] Date of birth: 08/09/1971 Age: 53 y.o. Gender: female  Primary Care Provider: Celine Mans, MD Consultants: GI, RD, palliative Code Status: DNR  Pt Overview and Major Events to Date:  3/20 - Admitted 3/24 - Rapid response called due to choking on medication 3/26 - PICC placed due to difficulty with IV placements 3/27 - EGD  Assessment and Plan:  53 year old female with past medical history cerebral palsy, GERD, seizures, hypertension, hyperlipidemia noted initially for AKI and abdominal pain, now with failure to thrive Assessment & Plan FTT (failure to thrive) in adult Unclear etiology, associated with nausea and abdominal pain.  EGD today with moderate gastritis with a few erosions in gastric body and antrum.  RD recommends corrtrak placement tomorrow. -GI following, appreciate recommendations - RD following, appreciate recommendations - Palliative care following, appreciate recommendations - D5 NS 0.45% @ 183ml/h, consider adding K to fluids - Protonix 40 mg IV daily - Carafate - will need to check on IV formulation - Tylenol 650 mg every 6 hours as needed - PT OT - CBG checks - AM CBC, CMP, mag, Phos Oral thrush Unable to see in patient's mouth to assess if this has resolved. - Diflucan 200 mg IV daily Hypokalemia Intermittent throughout admission, normalized this morning. - AM BMP - Replete electrolytes as needed  AKI (acute kidney injury) (HCC) (Resolved: 11/08/2023) Resolved  Chronic and Stable Problems:  GERD-Protonix 40 mg IV Seizures-IV Depakote 500 mg twice daily, IV Keppra 100 mg twice daily.Holding home Tegretol and Topamax as she cannot tolerate PO HTN-holding home BP meds HLD-holding home Pravastatin 40mg , unable to take PO Constipation-stable   FEN/GI: Full liquid diet PPx: Lovenox Dispo:Home pending clinical improvement    Subjective:  No acute events overnight.  She will acknowledge me today but not speaking  Objective: Temp:  [97.1 F (36.2 C)-98.2 F (36.8 C)] 97.2 F (36.2 C) (03/27 1317) Pulse Rate:  [66-108] 74 (03/27 1317) Resp:  [15-20] 17 (03/27 1317) BP: (97-153)/(48-109) 111/67 (03/27 1317) SpO2:  [95 %-100 %] 100 % (03/27 1317) Weight:  [75.4 kg] 75.4 kg (03/27 0949) Physical Exam: General: Chronically ill-appearing Cardiovascular: Regular rate and rhythm, no murmurs Respiratory: Breathing comfortably on room air, CTAB Abdomen: Bowel sounds present, soft, nontender  Laboratory: Most recent CBC Lab Results  Component Value Date   WBC 4.1 11/08/2023   HGB 9.7 (L) 11/08/2023   HCT 29.8 (L) 11/08/2023   MCV 94.3 11/08/2023   PLT 101 (L) 11/08/2023   Most recent BMP    Latest Ref Rng & Units 11/08/2023    3:22 AM  BMP  Glucose 70 - 99 mg/dL 91   BUN 6 - 20 mg/dL <5   Creatinine 6.64 - 1.00 mg/dL 4.03   Sodium 474 - 259 mmol/L 135   Potassium 3.5 - 5.1 mmol/L 3.5   Chloride 98 - 111 mmol/L 106   CO2 22 - 32 mmol/L 20   Calcium 8.9 - 10.3 mg/dL 8.5    Timur Nibert, DO 11/08/2023, 1:36 PM  PGY-1, [facility] FPTS Intern pager: [phone], text pages welcome Secure chat group [facility] Teaching Service

## 2023-11-08 NOTE — Progress Notes (Signed)
 Physical Therapy Treatment Patient Details Name: Patricia White MRN: [id number] DOB: July 21, 1971 Today's Date: 11/08/2023   History of Present Illness 53 y/o female presents to [facility] on 11/01/23 for hypotension and dehydration in setting of 3 days of cough, nausea and vomiting. Found to have AKI, hypokalemia in ED. PMH: Infantile CP with chronic L hemiparesis, cared for by aunt, seizure disorder, moderate ID, HTN, seasonal allergies, T2DM, HLD, obesity.    PT Comments  Pt received sidelying to her L, sleeping but easily awoken and agreeable to therapy session with encouragement. Pt with improved alertness and participation once at EOB and needing up to +2 maxA to transfer to EOB on R using hospital bed features and up to +2 modA for sit<>stand from elevated bed to [facility] platform lift. TotalA to transfer along EOB toward [facility] in [place]. Pt mildly impulsive and with decreased awareness of safety/body position and remains a high fall risk at this time, needing +2 physical assist for pre-gait tasks this date. At baseline pt is ambulatory without AD, pt remains below functional baseline and would benefit from moderate to high intensity post-acute services >3 hours per day prior to DC home, discussed with pt and aunt as well as supervising PT Zain B. Orthostatic Lying   BP- Lying 107/69  Pulse- Lying 79  Orthostatic Sitting  BP- Sitting (!) 134/109 (taken in R calf so likely inaccurate)  Pulse- Sitting 92     If plan is discharge home, recommend the following: Assistance with cooking/housework;Assistance with feeding;Direct supervision/assist for medications management;Assist for transportation;Help with stairs or ramp for entrance;Supervision due to cognitive status;Two people to help with walking and/or transfers;A lot of help with bathing/dressing/bathroom;Direct supervision/assist for financial management   Can travel by private vehicle        Equipment Recommendations  None recommended by PT;Other  (comment) (TBD post-acute)    Recommendations for Other Services Rehab consult     Precautions / Restrictions Precautions Precautions: Fall Recall of Precautions/Restrictions: Impaired Precaution/Restrictions Comments: had a fall the night of 3/20 per AM nurse Required Braces or Orthoses: Other Brace Other Brace: LLE AFO (in room and sneakers) Restrictions Weight Bearing Restrictions Per Provider Order: No     Mobility  Bed Mobility Overal bed mobility: Needs Assistance Bed Mobility: Rolling, Sidelying to Sit, Sit to Supine Rolling: Min assist, Used rails Sidelying to sit: Max assist, +2 for physical assistance, HOB elevated, Used rails Supine to sit: Min assist, +2 for physical assistance     General bed mobility comments: Increased time to intiate and perform, hand over hand assist when initiating with pt then able to utilize bed rail and elevated HOB to assist with raising her trunk. BLE maxA to slide legs toward R EOB and to advance hips to EOB fully.    Transfers Overall transfer level: Needs assistance Equipment used: 1 person hand held assist, Ambulation equipment used Transfers: Sit to/from Stand, Bed to chair/wheelchair/BSC Sit to Stand: Min assist, Mod assist, +2 physical assistance, From elevated surface, Via lift equipment           General transfer comment: EOB<>Stedy x2 with min to modA, then from elevated Stedy flaps x2 with min to CGA, then from EOB with R side rail and minA + HHA to steady her. Scooted toward [facility] from end of bed in [place] platform. Defer OOB to chair due to pt fatigue after recent procedure and multiple transfer trials. Transfer via Lift Equipment: Stedy  Ambulation/Gait  Pre-gait activities: standing hip flexion x5 reps in Stedy (minimal bil foot clearance) +2 assist     Stairs             Wheelchair Mobility     Tilt Bed    Modified Rankin (Stroke Patients Only)       Balance Overall balance  assessment: Needs assistance Sitting-balance support: Single extremity supported, Feet supported Sitting balance-Leahy Scale: Poor Sitting balance - Comments: impulsive anterior lean, increases with fatigue needing CGA to modA to prevent anterior LOB Postural control: Other (comment) (Anterior lean) Standing balance support: Single extremity supported, Reliant on assistive device for balance Standing balance-Leahy Scale: Poor Standing balance comment: +1 assist static standing in Stedy, +2 dynamic standing tasks; only uses RUE for support due to L hemi                            Communication Communication Communication: Impaired Factors Affecting Communication: Reduced clarity of speech;Other (comment) (dysarthric speech, may be her baseline 2/2 CP)  Cognition Arousal: Alert Behavior During Therapy: WFL for tasks assessed/performed   PT - Cognitive impairments: Difficult to assess, Initiation, Sequencing, Problem solving, Safety/Judgement, Attention Difficult to assess due to: Impaired communication                     PT - Cognition Comments: Development disability at baseline, but per Aunt who was present, pt has been more lethargic since last PT session; Pt initially drowsy and keeping eyes closed but once PTA and OT assisting her to roll, pt more alert and with improved participation once sitting EOB. Pt and Aunt report she really likes cartoons and is motivated by desire to go to [employer] for her birthday in May, pt able to state DOB when prompted. Orientation not otherwise assessed by PTA. Pt needs increased time and frequently needs multimodal cues to participate in seated/standing exercises 2/2 cognitive deficit. Following commands: Impaired Following commands impaired: Follows one step commands with increased time, Follows one step commands inconsistently, Only follows one step commands consistently    Cueing Cueing Techniques: Verbal cues, Gestural  cues, Tactile cues, Visual cues  Exercises General Exercises - Lower Extremity Long Arc Quad: AROM, Both, 10 reps, Seated Hip Flexion/Marching: AROM, AAROM, Both, 10 reps, 5 reps, Seated, Standing, Other (comment) (AA to initiate progressing to AROM; x10 reps seated, x5 reps standing) Other Exercises Other Exercises: STS x 5 total reps (with R rail, and x2 with stedy and x2 from EOB    General Comments General comments (skin integrity, edema, etc.): Bed linens and gown changed during session due to wet bed; see BP in comments above; HR 105 bpm with standing and 92 bpm sitting EOB; SpO2 WFL on RA      Pertinent Vitals/Pain Pain Assessment Pain Assessment: Faces Faces Pain Scale: Hurts a little bit Pain Location: Internittent grimacing but no localized c/o pain. Bed linens were wet so when pt stood PTA checked her bottom/lower back and no visible open areas of skin observed. Pain Descriptors / Indicators: Grimacing Pain Intervention(s): Monitored during session, Repositioned    Home Living                          Prior Function            PT Goals (current goals can now be found in the care plan section) Acute Rehab PT Goals Patient Stated Goal: To  go home PT Goal Formulation: With patient/family Time For Goal Achievement: 11/17/23 Progress towards PT goals: Progressing toward goals    Frequency    Min 3X/week      PT Plan      Co-evaluation              AM-PAC PT "6 Clicks" Mobility   Outcome Measure  Help needed turning from your back to your side while in a flat bed without using bedrails?: A Little Help needed moving from lying on your back to sitting on the side of a flat bed without using bedrails?: A Little Help needed moving to and from a bed to a chair (including a wheelchair)?: A Little Help needed standing up from a chair using your arms (e.g., wheelchair or bedside chair)?: A Little Help needed to walk in hospital room?: A Lot Help  needed climbing 3-5 steps with a railing? : A Lot 6 Click Score: 16    End of Session Equipment Utilized During Treatment: Gait belt Activity Tolerance: Patient tolerated treatment well Patient left: in bed;with call bell/phone within reach;with bed alarm set;with family/visitor present;Other (comment) (HOB >30* for improved pulmonary clearance; sidelying toward her L) Nurse Communication: Mobility status;Need for lift equipment PT Visit Diagnosis: Other abnormalities of gait and mobility (R26.89)     Time: 1914-7829 PT Time Calculation (min) (ACUTE ONLY): 44 min  Charges:    $Therapeutic Exercise: 8-22 mins $Therapeutic Activity: 8-22 mins PT General Charges $$ ACUTE PT VISIT: 1 Visit                     Triniti Gruetzmacher P., PTA Acute Rehabilitation Services Secure Chat Preferred 9a-5:30pm Office: [phone]    Angus Palms 11/08/2023, 3:36 PM

## 2023-11-08 NOTE — Assessment & Plan Note (Signed)
 Unclear etiology, associated with nausea and abdominal pain.  EGD today with moderate gastritis with a few erosions in gastric body and antrum.  RD recommends corrtrak placement tomorrow. -GI following, appreciate recommendations - RD following, appreciate recommendations - Palliative care following, appreciate recommendations - D5 NS 0.45% @ 138ml/h, consider adding K to fluids - Protonix 40 mg IV daily - Carafate - will need to check on IV formulation - Tylenol 650 mg every 6 hours as needed - PT OT - CBG checks - AM CBC, CMP, mag, Phos

## 2023-11-08 NOTE — Progress Notes (Signed)
 PT Cancellation Note  Patient Details Name: Patricia White MRN: [id number] DOB: 07/10/1971   Cancelled Treatment:    Reason Eval/Treat Not Completed: (P) Patient at procedure or test/unavailable (pt off unit for EGD.) Will continue efforts per PT plan of care as schedule permits.   Dorathy Kinsman Keyaan Lederman 11/08/2023, 9:48 AM

## 2023-11-08 NOTE — Progress Notes (Signed)
  Inpatient Rehab Admissions Coordinator :  Per therapy recommendations patient was screened for CIR candidacy by Ottie Glazier RN MSN. Patient is not at a level to tolerate the intensity required to pursue a CIR admit . The CIR admissions team will follow and monitor for progress and place a Rehab Consult order if felt to be appropriate. Please contact me with any questions.  Ottie Glazier RN MSN Admissions Coordinator [phone]

## 2023-11-08 NOTE — Transfer of Care (Signed)
 Immediate Anesthesia Transfer of Care Note  Patient: Patricia White  Procedure(s) Performed: EGD (ESOPHAGOGASTRODUODENOSCOPY) POLYPECTOMY  Patient Location: PACU  Anesthesia Type:MAC  Level of Consciousness: drowsy  Airway & Oxygen Therapy: Patient Spontanous Breathing and Patient connected to face mask oxygen  Post-op Assessment: Report given to RN and Post -op Vital signs reviewed and stable  Post vital signs: Reviewed and stable  Last Vitals:  Vitals Value Taken Time  BP 98/48 11/08/23 1145  Temp 36.3 C 11/08/23 1145  Pulse 81 11/08/23 1147  Resp 15 11/08/23 1147  SpO2 99 % 11/08/23 1147  Vitals shown include unfiled device data.  Last Pain:  Vitals:   11/08/23 1145  TempSrc:   PainSc: 0-No pain         Complications: No notable events documented.

## 2023-11-08 NOTE — Anesthesia Preprocedure Evaluation (Addendum)
 Anesthesia Evaluation  Patient identified by MRN, date of birth, ID band Patient awake    Reviewed: Allergy & Precautions, NPO status , Patient's Chart, lab work & pertinent test results  History of Anesthesia Complications Negative for: history of anesthetic complications  Airway Mallampati: Unable to assess  TM Distance: >3 FB     Dental  (+) Dental Advisory Given   Pulmonary asthma    Pulmonary exam normal breath sounds clear to auscultation       Cardiovascular hypertension (losartan), Pt. on medications (-) angina (-) Past MI, (-) Cardiac Stents and (-) CABG (-) dysrhythmias  Rhythm:Regular Rate:Normal  HLD   Neuro/Psych Seizures - (years prior), Well Controlled,  Intellectual disability  Neuromuscular disease (cerebral palsy, congenital left hemiparesis)    GI/Hepatic Neg liver ROS,GERD  Medicated,,  Endo/Other  diabetesHypothyroidism    Renal/GU Renal disease (AKI, improved)     Musculoskeletal   Abdominal   Peds  Hematology negative hematology ROS (+) Lab Results      Component                Value               Date                      WBC                      4.1                 11/08/2023                HGB                      9.7 (L)             11/08/2023                HCT                      29.8 (L)            11/08/2023                MCV                      94.3                11/08/2023                PLT                      101 (L)             11/08/2023              Anesthesia Other Findings Failure to thrive  Reproductive/Obstetrics                              Anesthesia Physical Anesthesia Plan  ASA: 3  Anesthesia Plan: MAC   Post-op Pain Management: Minimal or no pain anticipated   Induction: Intravenous  PONV Risk Score and Plan: 2 and Propofol infusion, TIVA and Treatment may vary due to age or medical condition  Airway Management Planned:  Natural Airway and Nasal Cannula  Additional Equipment:   Intra-op Plan:   Post-operative Plan:   Informed Consent: I have reviewed  the patients History and Physical, chart, labs and discussed the procedure including the risks, benefits and alternatives for the proposed anesthesia with the patient or authorized representative who has indicated his/her understanding and acceptance.   Patient has DNR.  Discussed DNR with power of attorney and Suspend DNR.   Dental advisory given  Plan Discussed with: CRNA and Anesthesiologist  Anesthesia Plan Comments: (Discussed with patient risks of MAC including, but not limited to, minor pain or discomfort, hearing people in the room, and possible need for backup general anesthesia. Risks for general anesthesia also discussed including, but not limited to, sore throat, hoarse voice, chipped/damaged teeth, injury to vocal cords, nausea and vomiting, allergic reactions, lung infection, heart attack, stroke, and death. All questions answered. )         Anesthesia Quick Evaluation

## 2023-11-08 NOTE — Assessment & Plan Note (Deleted)
 Unclear etiology, has been going on since admission. Will consult RD today given it has now been several days of not eating. GI planning endoscopy hopefully tomorrow with electrolytes optimized.  - GI following - RD consulted, appreciate recs - D10 IV at 172ml/h - CBG checks to monitor for hypoglycemia

## 2023-11-08 NOTE — Plan of Care (Signed)
 Evaluated patient at bedside after EGD.  Aunt states that she has just been sleeping since the procedure.  States that she has not eaten or drank anything today. I asked patient if she would like an Ensure.  She requested chocolate. I held Ensure with straw and she took 2 sips of it and then stated no more and pushed the Ensure away.  Unable to tell me if this is due to pain or if she just does not want to drink.  Will likely need to proceed with Cortrak tomorrow

## 2023-11-09 ENCOUNTER — Inpatient Hospital Stay (HOSPITAL_COMMUNITY)

## 2023-11-09 ENCOUNTER — Encounter (HOSPITAL_COMMUNITY): Payer: Self-pay | Admitting: Family Medicine

## 2023-11-09 DIAGNOSIS — R627 Adult failure to thrive: Secondary | ICD-10-CM | POA: Diagnosis not present

## 2023-11-09 DIAGNOSIS — R110 Nausea: Secondary | ICD-10-CM | POA: Diagnosis not present

## 2023-11-09 DIAGNOSIS — N390 Urinary tract infection, site not specified: Secondary | ICD-10-CM | POA: Diagnosis not present

## 2023-11-09 DIAGNOSIS — Z515 Encounter for palliative care: Secondary | ICD-10-CM | POA: Diagnosis not present

## 2023-11-09 DIAGNOSIS — G809 Cerebral palsy, unspecified: Secondary | ICD-10-CM | POA: Diagnosis not present

## 2023-11-09 DIAGNOSIS — E860 Dehydration: Secondary | ICD-10-CM | POA: Diagnosis not present

## 2023-11-09 DIAGNOSIS — K2970 Gastritis, unspecified, without bleeding: Secondary | ICD-10-CM | POA: Diagnosis not present

## 2023-11-09 DIAGNOSIS — N179 Acute kidney failure, unspecified: Secondary | ICD-10-CM | POA: Diagnosis not present

## 2023-11-09 DIAGNOSIS — R1084 Generalized abdominal pain: Secondary | ICD-10-CM | POA: Diagnosis not present

## 2023-11-09 DIAGNOSIS — Z7189 Other specified counseling: Secondary | ICD-10-CM | POA: Diagnosis not present

## 2023-11-09 LAB — BASIC METABOLIC PANEL WITH GFR
Anion gap: 5 (ref 5–15)
BUN: <5 mg/dL — ABNORMAL LOW (ref 6–20)
CO2: 24 mmol/L (ref 22–32)
Calcium: 8.3 mg/dL — ABNORMAL LOW (ref 8.9–10.3)
Chloride: 108 mmol/L (ref 98–111)
Creatinine, Ser: 0.92 mg/dL (ref 0.44–1.00)
GFR, Estimated: >60 mL/min (ref >60)
Glucose, Bld: 80 mg/dL (ref 70–99)
Potassium: 3.2 mmol/L — ABNORMAL LOW (ref 3.5–5.1)
Sodium: 137 mmol/L (ref 135–145)

## 2023-11-09 LAB — GLUCOSE, CAPILLARY
Glucose-Capillary: 104 mg/dL — ABNORMAL HIGH (ref 70–99)
Glucose-Capillary: 127 mg/dL — ABNORMAL HIGH (ref 70–99)
Glucose-Capillary: 138 mg/dL — ABNORMAL HIGH (ref 70–99)
Glucose-Capillary: 144 mg/dL — ABNORMAL HIGH (ref 70–99)
Glucose-Capillary: 57 mg/dL — ABNORMAL LOW (ref 70–99)
Glucose-Capillary: 62 mg/dL — ABNORMAL LOW (ref 70–99)
Glucose-Capillary: 77 mg/dL (ref 70–99)
Glucose-Capillary: 79 mg/dL (ref 70–99)
Glucose-Capillary: 99 mg/dL (ref 70–99)

## 2023-11-09 LAB — CBC
HCT: 27.4 % — ABNORMAL LOW (ref 36.0–46.0)
Hemoglobin: 9 g/dL — ABNORMAL LOW (ref 12.0–15.0)
MCH: 30.8 pg (ref 26.0–34.0)
MCHC: 32.8 g/dL (ref 30.0–36.0)
MCV: 93.8 fL (ref 80.0–100.0)
Platelets: 90 10*3/uL — ABNORMAL LOW (ref 150–400)
RBC: 2.92 MIL/uL — ABNORMAL LOW (ref 3.87–5.11)
RDW: 13.2 % (ref 11.5–15.5)
WBC: 4.3 10*3/uL (ref 4.0–10.5)
nRBC: 0 % (ref 0.0–0.2)

## 2023-11-09 LAB — MAGNESIUM: Magnesium: 1.4 mg/dL — ABNORMAL LOW (ref 1.7–2.4)

## 2023-11-09 LAB — SURGICAL PATHOLOGY

## 2023-11-09 LAB — PHOSPHORUS: Phosphorus: 1.5 mg/dL — ABNORMAL LOW (ref 2.5–4.6)

## 2023-11-09 MED ORDER — MAGNESIUM SULFATE 4 GM/100ML IV SOLN
4.0000 g | Freq: Once | INTRAVENOUS | Status: AC
  Administered 2023-11-09: 4 g via INTRAVENOUS
  Filled 2023-11-09: qty 100

## 2023-11-09 MED ORDER — MAGNESIUM SULFATE 2 GM/50ML IV SOLN
2.0000 g | Freq: Once | INTRAVENOUS | Status: DC
  Filled 2023-11-09: qty 50

## 2023-11-09 MED ORDER — POTASSIUM PHOSPHATES 15 MMOLE/5ML IV SOLN
15.0000 mmol | Freq: Once | INTRAVENOUS | Status: DC
  Filled 2023-11-09: qty 5

## 2023-11-09 MED ORDER — POTASSIUM CHLORIDE 10 MEQ/100ML IV SOLN
10.0000 meq | INTRAVENOUS | Status: AC
  Administered 2023-11-09 (×4): 10 meq via INTRAVENOUS
  Filled 2023-11-09 (×4): qty 100

## 2023-11-09 MED ORDER — ONDANSETRON HCL 4 MG/2ML IJ SOLN
4.0000 mg | Freq: Once | INTRAMUSCULAR | Status: AC
  Administered 2023-11-09: 4 mg via INTRAVENOUS
  Filled 2023-11-09: qty 2

## 2023-11-09 MED ORDER — PROSOURCE TF20 ENFIT COMPATIBL EN LIQD
60.0000 mL | Freq: Every day | ENTERAL | Status: DC
  Administered 2023-11-09 – 2023-11-15 (×7): 60 mL
  Filled 2023-11-09 (×7): qty 60

## 2023-11-09 MED ORDER — POTASSIUM PHOSPHATES 15 MMOLE/5ML IV SOLN
30.0000 mmol | Freq: Once | INTRAVENOUS | Status: AC
  Administered 2023-11-09: 30 mmol via INTRAVENOUS
  Filled 2023-11-09: qty 10

## 2023-11-09 MED ORDER — OSMOLITE 1.5 CAL PO LIQD
1000.0000 mL | ORAL | Status: DC
  Administered 2023-11-09 – 2023-11-13 (×4): 1000 mL
  Filled 2023-11-09 (×7): qty 1000

## 2023-11-09 NOTE — Assessment & Plan Note (Addendum)
 Intermittent throughout admission, 3.2 this morning and repleted - Added K to fluids - AM BMP - Replete electrolytes as needed

## 2023-11-09 NOTE — Progress Notes (Signed)
 Daily Progress Note Intern Pager: [phone]  Patient name: Patricia [name] Medical record number: [id number] Date of birth: 1971/03/02 Age: 53 y.o. Gender: female  Primary Care Provider: Celine Mans, MD Consultants: GI, RD, palliative Code Status: DNR  Pt Overview and Major Events to Date:  3/20 - Admitted 3/24 - Rapid response called due to choking on medication 3/26 - PICC placed due to difficulty with IV placements 3/27 - EGD showing gastritis  Assessment and Plan:  53yo female with PMHx cerebral palsy, GERD, seizures, HTN, HLD admitted initially for AKI and abdominal pain, now with failure to thrive.  Will likely need Cortrak placement however I do not think patient's aunt has official HCPOA paperwork.  Will need this likely prior to proceeding with Cortrak placement.  Assessment & Plan FTT (failure to thrive) in adult Unclear etiology, associated with nausea and abdominal pain.  EGD today with moderate gastritis with a few erosions in gastric body and antrum.  RD recommends corrtrak placement, agree she will need this as I do not think she can tolerate enough PO to meet her caloric needs. - GI following, appreciate recommendations - RD following, appreciate recommendations - Palliative care following, appreciate recommendations - D5 0.45$ NaCl with Kcl at 55ml/h - Protonix 40 mg IV daily - Carafate 1g suspension TID - Tylenol 650 mg every 6 hours as needed - PT OT - CBG checks - AM CBC, CMP, mag, Phos Oral thrush This appears to have improved significantly today. Also eating some ice cream.  - Diflucan 200 mg IV daily (3/25-) Hypokalemia Intermittent throughout admission, 3.2 this morning and repleted - Added K to fluids - AM BMP - Replete electrolytes as needed  Hypomagnesemia Mag 1.4 this morning, likely due to poor PO intake. Caution refeeding syndrome - Repleted - am Mag Hypophosphatemia Phos 1.5 this morning, likely due to poor PO intake.  Caution refeeding syndrome - Repleted - am Phosphorus  Chronic and Stable Problems:  GERD - protonix 40mg  IV Seizures - IV Depakote 500mg  BID, IV Keppra 100mg  BID. Holding home Tegretol and Topamax (cannot tolerate PO) HTN - holding home BP meds HLD - holding home Pravastatin 40mg , unable to take PO Constipation - stable    FEN/GI: Regular diet PPx: Lovenox Dispo:Home with home health pending clinical improvement   Subjective:  NAEON. Pt smiling, eating ice cream. Looks much better  Objective: Temp:  [97.2 F (36.2 C)-98.7 F (37.1 C)] 98 F (36.7 C) (03/28 0756) Pulse Rate:  [66-110] 110 (03/28 0756) Resp:  [17-19] 19 (03/28 0419) BP: (111-127)/(67-82) 123/80 (03/28 0756) SpO2:  [90 %-100 %] 90 % (03/28 0756) Weight:  [69 kg] 69 kg (03/27 1526) Physical Exam: General: No acute distress, much more interactive and alert this morning Cardiovascular: RRR, no murmurs Respiratory: Breathing comfortably on RA Abdomen: Soft, nontender  Laboratory: Most recent CBC Lab Results  Component Value Date   WBC 4.3 11/09/2023   HGB 9.0 (L) 11/09/2023   HCT 27.4 (L) 11/09/2023   MCV 93.8 11/09/2023   PLT 90 (L) 11/09/2023   Most recent BMP    Latest Ref Rng & Units 11/09/2023    4:13 AM  BMP  Glucose 70 - 99 mg/dL 80   BUN 6 - 20 mg/dL <5   Creatinine 6.57 - 1.00 mg/dL 8.46   Sodium 962 - 952 mmol/L 137   Potassium 3.5 - 5.1 mmol/L 3.2   Chloride 98 - 111 mmol/L 108   CO2 22 - 32 mmol/L  24   Calcium 8.9 - 10.3 mg/dL 8.3    Sarahjane Matherly, DO 11/09/2023, 12:47 PM  PGY-1, [facility] FPTS Intern pager: [phone], text pages welcome Secure chat group [facility] Teaching Service

## 2023-11-09 NOTE — Assessment & Plan Note (Addendum)
 Unclear etiology, associated with nausea and abdominal pain.  EGD today with moderate gastritis with a few erosions in gastric body and antrum.  RD recommends corrtrak placement, agree she will need this as I do not think she can tolerate enough PO to meet her caloric needs. - GI following, appreciate recommendations - RD following, appreciate recommendations - Palliative care following, appreciate recommendations - D5 0.45$ NaCl with Kcl at 60ml/h - Protonix 40 mg IV daily - Carafate 1g suspension TID - Tylenol 650 mg every 6 hours as needed - PT OT - CBG checks - AM CBC, CMP, mag, Phos

## 2023-11-09 NOTE — Plan of Care (Signed)

## 2023-11-09 NOTE — Progress Notes (Addendum)
 Patricia [name] GASTROENTEROLOGY ROUNDING NOTE   Subjective: EGD yesterday with moderate gastritis with a few shallow erosions (biopsied; path pending) and a 6 mm polyp resected from the gastric cardia (path pending).  No gastric outlet obstruction and otherwise normal duodenum.  Appears more awake and improving today.  Aunt says she had a few bites this morning.  Patricia [name] does softly voiced that she would like to advance diet today which is a large improvement from previous evaluations.   Objective: Vital signs in last 24 hours: Temp:  [97.2 F (36.2 C)-98.7 F (37.1 C)] 98 F (36.7 C) (03/28 0756) Pulse Rate:  [66-110] 110 (03/28 0756) Resp:  [15-19] 19 (03/28 0419) BP: (97-127)/(48-82) 123/80 (03/28 0756) SpO2:  [90 %-100 %] 90 % (03/28 0756) Weight:  [69 kg] 69 kg (03/27 1526) Last BM Date : 11/06/23 General: NAD Abdomen:  Soft, minimal TTP, ND    Intake/Output from previous day: 03/27 0701 - 03/28 0700 In: 1322.2 [I.V.:1000; IV Piggyback:322.2] Out: 1400 [Urine:1400] Intake/Output this shift: No intake/output data recorded.   Lab Results: Recent Labs    11/08/23 0322 11/09/23 0413  WBC 4.1 4.3  HGB 9.7* 9.0*  PLT 101* 90*  MCV 94.3 93.8   BMET Recent Labs    11/07/23 1527 11/08/23 0322 11/09/23 0413  NA 134* 135 137  K 3.1* 3.5 3.2*  CL 103 106 108  CO2 22 20* 24  GLUCOSE 83 91 80  BUN <5* <5* <5*  CREATININE 1.07* 0.98 0.92  CALCIUM 8.6* 8.5* 8.3*   LFT No results for input(s): "PROT", "ALBUMIN", "AST", "ALT", "ALKPHOS", "BILITOT", "BILIDIR", "IBILI" in the last 72 hours. PT/INR No results for input(s): "INR" in the last 72 hours.  A. STOMACH, BIOPSY:  - Gastric antral and oxyntic mucosa with nonspecific reactive  gastropathy  - Helicobacter pylori-like organisms are not identified on routine HE  stain   B. STOMACH, POLYPECTOMY:  - Gastric hyperplastic polyp  - Negative for intestinal metaplasia or dysplasia   Imaging/Other results: [place] EKG SITE  RITE Result Date: 11/07/2023 If Site Rite image not attached, placement could not be confirmed due to current cardiac rhythm.     Assessment and Plan:  1) Gastritis 2) Nausea 3) Abdominal pain 4) Failure to thrive 5) AKI-resolved 53 year old female with cerebral palsy admitted with nausea/vomiting, decreased appetite, generalized abdominal pain, AKI.  Admission CT and ultrasound largely unremarkable for any acute intra-abdominal pathology.  Clinically seems most consistent that she had a infectious gastroenteritis PTA with subsequent decreased appetite and refusal to eat, likely stemming from abdominal pain and gastritis.  EGD on 11/08/2023 with moderate gastritis with a few scattered shallow erosions (path: Non-H. pylori gastritis, benign gastric hyperplastic polyp).  Has had improvement over the last 24 hours. - Advance diet as tolerated.  Explained to patient and aunt at bedside that she can ask for anything to try to improve her caloric intake - PICC in place.  Continue IVF - Protonix 40 mg twice daily - Sucralfate 1 g 4 times daily - Will follow-up on pending pathology results - Continue encouraging p.o. intake. - Nutrition Service consulted.  Agree that if she cannot keep up with caloric demands, will need core track placement.  Hopefully see continued improvement today - Continue daily BMP and electrolytes with repletion per protocol.  Needs phosphorus, K, and mag repletion today  Dr. Elnoria Howard will be covering the GI service through the weekend and following peripherally.  Please do not hesitate to reach out directly to him if  any questions or concerns related to [facility].  Otherwise, inpatient service will see again on Monday if still in house.    Patricia Vannostrand V Nasreen Goedecke, DO  11/09/2023, 10:00 AM [facility] Pager [phone]

## 2023-11-09 NOTE — Care Plan (Signed)
 Received secure chat from RN Sheryle Hail that patient was having episode of emesis.  CBG appears to be downtrending again, most recently 77.  Asked RN to collect CBG and CBC, BMP, Mg, Phos morning labs now.  Zofran 4 mg IV x1 ordered.  Will go to bedside to evaluate patient as well.  Darral Dash, DO PGY-3 [facility]

## 2023-11-09 NOTE — Progress Notes (Signed)
 Daily Progress Note   Patient Name: Patricia [name]       Date: 11/09/2023 DOB: 1971-06-10  Age: 53 y.o. MRN#: [id number] Attending Physician: Nestor Ramp, MD Primary Care Physician: Celine Mans, MD Admit Date: 11/01/2023  Reason for Consultation/Follow-up: Establishing goals of care  Subjective: Patient  more awake today - unfortunately had just vomited upon my arrival - RN administering medication, aunt at bedside reports mental status much better  Length of Stay: 5  Current Medications: Scheduled Meds:   atorvastatin  40 mg Oral Daily   Chlorhexidine Gluconate Cloth  6 each Topical Daily   enoxaparin (LOVENOX) injection  40 mg Subcutaneous Q24H   famotidine  20 mg Oral BID   feeding supplement  237 mL Oral BID BM   multivitamin with minerals  1 tablet Oral Daily   pantoprazole (PROTONIX) IV  40 mg Intravenous Daily   polyethylene glycol  17 g Oral Daily   senna-docusate  1 tablet Oral QHS   sodium chloride flush  10-40 mL Intracatheter Q12H   sucralfate  1 g Oral TID WC & HS   thiamine  100 mg Oral Daily    Continuous Infusions:  dextrose 5 % and 0.45 % NaCl with KCl 20 mEq/L 60 mL/hr at 11/09/23 0101   fluconazole (DIFLUCAN) IV 200 mg (11/08/23 0857)   levETIRAcetam 500 mg (11/08/23 2206)   potassium chloride 10 mEq (11/09/23 0848)   potassium PHOSPHATE IVPB (in mmol) 30 mmol (11/09/23 1116)   valproate sodium 250 mg (11/09/23 1003)    PRN Meds: acetaminophen **OR** acetaminophen, dextrose, LORazepam, sodium chloride flush  Physical Exam Constitutional:      General: She is not in acute distress.    Appearance: She is ill-appearing.     Comments: Not interactive  Pulmonary:     Effort: Pulmonary effort is normal.  Skin:    General: Skin is warm and dry.              Vital Signs: BP 123/80 (BP Location: Right Leg)   Pulse (!) 110   Temp 98 F (36.7 C)   Resp 19   Ht 5\' 3"  (1.6 m)   Wt 69 kg   SpO2 90%   BMI 26.95 kg/m  SpO2: SpO2: 90 % O2 Device: O2 Device: Room Air O2 Flow Rate:    Intake/output summary:  Intake/Output Summary (Last 24 hours) at 11/09/2023 1253 Last data filed at 11/09/2023 0346 Gross per 24 hour  Intake 1322.15 ml  Output 1400 ml  Net -77.85 ml   LBM: Last BM Date : 11/06/23 Baseline Weight: Weight: 75.4 kg Most recent weight: Weight: 69 kg       Palliative Assessment/Data: PPS 10%      Patient Active Problem List   Diagnosis Date Noted   Hypomagnesemia 11/09/2023   Hypophosphatemia 11/09/2023   Dehydration 11/08/2023   Gastritis and gastroduodenitis 11/08/2023   Gastric polyp 11/08/2023   Nausea without vomiting 11/08/2023   Protein-calorie malnutrition, severe 11/08/2023   Decreased appetite 11/05/2023   Chronic health problem 11/01/2023   Oral thrush 11/01/2023   FTT (failure to thrive) in adult 11/01/2023   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   Hot flashes due  to menopause 06/30/2021   Hyperlipidemia associated with type 2 diabetes mellitus (HCC) 11/24/2020   Diabetes mellitus without complication (HCC) 11/23/2020   Hypokalemia 11/23/2020   Angioedema 12/10/2019   Lesion of mandible 12/10/2019   Seasonal allergies 11/20/2019   Essential hypertension 10/23/2019   Seizure disorder (HCC) 02/23/2016   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    Palliative care has been asked to support additional goals of care conversations.   RD  has recommended coretrak placement d/t ongoing poor PO intake.   EGD 3/27 with moderate gastritis with a few erosions in gastric body and antrum  - on protonix and carafate.   Assessment: Follow-up today with patient and aunt at bedside. Patient is more awake today.  RN reports diet has been liberalized in hopes to increase patient's intake.  Discussed situation with aunt who is pleased with patient's improved mental status and hopeful for improved intake. She again reports if cortrak is needed she and the patient's grandmother would agree to this - she asks questions about cortrak and they were answered. Discussed this is a temporary measure to support nutrition.   Recommendations/Plan: Trial of cortrak per primary PMT will follow DNR/DNI  Code Status: DNR  Care plan was discussed with patient's aunt  Thank you for allowing the Palliative Medicine Team to assist in the care of this patient.   Total Time 35 minutes Prolonged Time Billed  no   Time spent includes: Detailed review of medical records (labs, imaging, vital signs), medically appropriate exam, discussion with treatment team, counseling and educating patient, family and/or staff, documenting clinical information, medication management and coordination of care.     *Please note that this is a verbal dictation therefore any spelling or grammatical errors are due to the "Dragon Medical One" system interpretation.  Gerlean Ren, DNP, [facility] Palliative Medicine Team Team Phone # [phone]  Pager [phone]

## 2023-11-09 NOTE — Assessment & Plan Note (Signed)
 Phos 1.5 this morning, likely due to poor PO intake. Caution refeeding syndrome - Repleted - am Phosphorus

## 2023-11-09 NOTE — Plan of Care (Signed)

## 2023-11-09 NOTE — Progress Notes (Signed)
  Inpatient Rehabilitation Admissions Coordinator   I will place consult for full assessment of candidacy for possible [facility] admit.  Ottie Glazier, RN, MSN Rehab Admissions Coordinator [phone] 11/09/2023 3:47 PM

## 2023-11-09 NOTE — Progress Notes (Signed)
 Physical Therapy Treatment Patient Details Name: Patricia [name] MRN: [id number] DOB: 1971/06/01 Today's Date: 11/09/2023   History of Present Illness 53 y/o female [facility] on [facility] for hypotension and dehydration in setting of 3 days of cough, nausea and vomiting. Found to have AKI, hypokalemia in ED. PMH: Infantile CP with chronic L hemiparesis, cared for by aunt, seizure disorder, moderate ID, HTN, seasonal allergies, T2DM, HLD, obesity.    PT Comments  Pt received in supine, lethargic initially but agreeable to therapy session after increased time and verbal/tactile cues given to encourage wakefulness. Pt seems to benefit from blinds open during the day to promote improved sleep/wake cycles. Pt needing increased assist to transfer from L sidelying to L EOB due to initial lethargy but once EOB pt more alert and able to follow simple commands for sit<>stand transfers to/from [employer] lift and chair. Pt agreeable to sit up in recliner at end of session, chair alarm on for safety and family present, pt smiling and interacting with family members in the room and on the phone at end of session. Pt LLE AFO in room appears ill fitting and velcro not working, MD notified in [name] orthotist can be consulted for new AFO prior to DC. At baseline pt is ambulatory without AD, and currently pt remains below functional baseline, requiring +2 assist for static standing and chair transfers and would benefit from moderate to high intensity post-acute services >3 hours per day prior to DC home.    If plan is discharge home, recommend the following: Assistance with cooking/housework;Assistance with feeding;Direct supervision/assist for medications management;Assist for transportation;Help with stairs or ramp for entrance;Supervision due to cognitive status;Two people to help with walking and/or transfers;A lot of help with bathing/dressing/bathroom;Direct supervision/assist for financial management   Can  travel by private vehicle        Equipment Recommendations  None recommended by PT;Other (comment) (TBD post-acute)    Recommendations for Other Services Rehab consult     Precautions / Restrictions Precautions Precautions: Fall Recall of Precautions/Restrictions: Impaired Precaution/Restrictions Comments: had a fall the night of 3/20 per AM nurse Required Braces or Orthoses: Other Brace Other Brace: LLE AFO (in room and sneakers) Restrictions Weight Bearing Restrictions Per Provider Order: No     Mobility  Bed Mobility Overal bed mobility: Needs Assistance Bed Mobility: Sidelying to Sit   Sidelying to sit: HOB elevated, Used rails, Max assist       General bed mobility comments: Increased time to intiate and perform, hand over hand assist when initiating with pt then able to utilize bed rail and elevated HOB to assist with raising her trunk, modA to raise trunk and maxA to advance BLE to EOB and advance hips to foot flat. More alert once pt upright.    Transfers Overall transfer level: Needs assistance Equipment used: Ambulation equipment used Transfers: Sit to/from Stand, Bed to chair/wheelchair/BSC Sit to Stand: Min assist, From elevated surface, Via lift equipment, +2 safety/equipment           General transfer comment: EOB> Stedy, elevated Stedy flaps>chair. TotalA to chair in [place] but pt able to shuffle each foot back toward chair prior to sitting while standing in [place] frame. Pt remains drowsy and family arriving to visit her so defer gait progression once pt up in chair. Appears this is first time pt getting up to chair in ~1 week. Transfer via Lift Equipment: Stedy  Ambulation/Gait  Pre-gait activities: weight shifting foot to foot x2 reps but fatigued quickly today.     Stairs             Wheelchair Mobility     Tilt Bed    Modified Rankin (Stroke Patients Only)       Balance Overall balance assessment: Needs  assistance Sitting-balance support: Feet supported, No upper extremity supported Sitting balance-Leahy Scale: Poor Sitting balance - Comments: R lean once EOB unsupported but remains slightly lethargic, with R rail balance is Fair Postural control:  (Anterior lean) Standing balance support: Single extremity supported, Reliant on assistive device for balance Standing balance-Leahy Scale: Poor Standing balance comment: +1 assist static standing in Stedy, +2 dynamic standing tasks; only uses RUE for support due to L hemi                            Communication Communication Communication: Impaired Factors Affecting Communication: Reduced clarity of speech;Other (comment) (dysarthric speech, may be her baseline 2/2 CP)  Cognition Arousal: Lethargic Behavior During Therapy: WFL for tasks assessed/performed, Flat affect   PT - Cognitive impairments: Difficult to assess, Initiation, Sequencing, Problem solving, Safety/Judgement, Attention Difficult to assess due to: Impaired communication, Level of arousal                     PT - Cognition Comments: Development disability at baseline, but per Aunt who was present, pt has been more lethargic today than previous afternoon. Pt initially drowsy and keeping eyes closed but once PTA and MS assisting her to elevate HOB, pt more alert and with much improved participation once sitting EOB. Pt and Aunt report she really likes cartoons and is motivated by desire to go to [employer] for her birthday in May. Pt needs increased time and frequently needs multimodal cues 2/2 drowsiness initially and cognitive deficit. Pt smiling once up in chair. Following commands: Impaired Following commands impaired: Follows one step commands with increased time, Only follows one step commands consistently (following commands once alert)    Cueing Cueing Techniques: Verbal cues, Gestural cues, Tactile cues, Visual cues  Exercises General  Exercises - Lower Extremity Long Arc Quad: AROM, Both, Seated, 5 reps Hip Flexion/Marching: AROM, Both, 5 reps, Seated, Standing, Other (comment) (x2 reps standing, x5 reps seated)    General Comments General comments (skin integrity, edema, etc.): pt in wet bed and lower portion of her gown appears damp (purewick leaking) so new gown placed and pt up in dry recliner with cushions under hips/lower back for comfort and RN arriving to change wet bed linens at end of session.      Pertinent Vitals/Pain Pain Assessment Pain Assessment: Faces Faces Pain Scale: Hurts little more Pain Location: "bottom" prior to transfer OOB to chair. Bed was wet so likely due to purewick leaking or prolonged positioning in L sidelying. Pain Descriptors / Indicators: Grimacing, Sore Pain Intervention(s): Limited activity within patient's tolerance, Monitored during session, Repositioned, Other (comment) (pillow under her hips in the chair and behind her lower back)    Home Living                          Prior Function            PT Goals (current goals can now be found in the care plan section) Acute Rehab PT Goals Patient Stated Goal: To go home PT Goal Formulation: With patient/family  Time For Goal Achievement: 11/17/23 Progress towards PT goals: Progressing toward goals    Frequency    Min 3X/week      PT Plan      Co-evaluation              AM-PAC PT "6 Clicks" Mobility   Outcome Measure  Help needed turning from your back to your side while in a flat bed without using bedrails?: A Little Help needed moving from lying on your back to sitting on the side of a flat bed without using bedrails?: A Lot (due to lethargy) Help needed moving to and from a bed to a chair (including a wheelchair)?: A Lot (anticipated once more alert, pt shuffles feet in [place] today) Help needed standing up from a chair using your arms (e.g., wheelchair or bedside chair)?: A Little Help needed to  walk in hospital room?: Total Help needed climbing 3-5 steps with a railing? : Total 6 Click Score: 12    End of Session Equipment Utilized During Treatment: Gait belt;Other (comment) (LLE AFO brace and shoes donned for standing and tf) Activity Tolerance: Patient tolerated treatment well Patient left: with call bell/phone within reach;with family/visitor present;Other (comment);in chair;with chair alarm set;with nursing/sitter in room (x2 family present) Nurse Communication: Mobility status;Need for lift equipment;Other (comment) (safer with Stedy for nursing staff transfers) PT Visit Diagnosis: Other abnormalities of gait and mobility (R26.89)     Time: 4098-1191 PT Time Calculation (min) (ACUTE ONLY): 30 min  Charges:    $Therapeutic Activity: 23-37 mins PT General Charges $$ ACUTE PT VISIT: 1 Visit                     Jacoria Keiffer P., PTA Acute Rehabilitation Services Secure Chat Preferred 9a-5:30pm Office: [phone]    Dorathy Kinsman [facility] 11/09/2023, 3:32 PM

## 2023-11-09 NOTE — Assessment & Plan Note (Signed)
 Mag 1.4 this morning, likely due to poor PO intake. Caution refeeding syndrome - Repleted - am Mag

## 2023-11-09 NOTE — Assessment & Plan Note (Addendum)
 This appears to have improved significantly today. Also eating some ice cream.  - Diflucan 200 mg IV daily (3/25-)

## 2023-11-09 NOTE — Progress Notes (Addendum)
 Nutrition Follow-up  DOCUMENTATION CODES:   Severe malnutrition in context of acute illness/injury  INTERVENTION:  Initiate tube feeding when access is obtained: Osmolite 1.5 at 10 ml and increase by 10 ml every 8 hours until 45 ml/h (1080 ml per day) Prosource TF20 60 ml daily  Provides 1700 kcal, 87 gm protein, 822 ml free water daily   Continue Ensure Enlive po BID, each supplement provides 350 kcal and 20 grams of protein. Continue Magic cup TID with meals, each supplement provides 290 kcal and 9 grams of protein 100 mg Thiamine  daily MVI with minerals  daily  Monitor PO intake and adjust tube feeds as necessary   Monitor magnesium, potassium, and phosphorus daily for at least 3 days, MD to replete as needed, as pt is at risk for refeeding syndrome   NUTRITION DIAGNOSIS:   Severe Malnutrition related to acute illness as evidenced by mild fat depletion, energy intake < or equal to 50% for > or equal to 5 days, moderate muscle depletion.  - Still applicable   GOAL:   Patient will meet greater than or equal to 90% of their needs  - Progressing   MONITOR:   PO intake, Supplement acceptance, Diet advancement, Labs, I & O's  REASON FOR ASSESSMENT:   Consult Assessment of nutrition requirement/status  ASSESSMENT:   53 y.o female with PMH of CP, seizures, L hemiparesis, HTN, T2DM, HLD. Presented with decreased appetite, nausea, vomiting, and abdominal pain.  Found to have gastritis and FTT.  3/27- EGD showed gastritis  3/28 - Tube feeds to start  Aunt at bedside, patient sleeping on visit. Aunt reports patient still has not showed interest in eating even with cueing. Has had multiple episodes of emesis today. Has had 0% of her meals and 0 Ensures. Cortrak or 10 french NGT access to be obtained today and tube feeds to start. Discussed continued encouragement from aunt regarding patient's PO intake.   Patient has had no nutrition for 15 days and will be at severe  refeeding risk. Continue Thiamine, increase tube feeds slowly to goal rate and  monitor lytes. Potassium, phos, and mag already low. MD supplementing. On D5.   Admit weight: 75.4 kg  Current weight: 69 kg    Average Meal Intake: No meals recorded  Nutritionally Relevant Medications: Scheduled Meds:  feeding supplement  237 mL Oral BID BM   feeding supplement (PROSource TF20)  60 mL Per Tube Daily   multivitamin with minerals  1 tablet Oral Daily   pantoprazole (PROTONIX) IV  40 mg Intravenous Daily   polyethylene glycol  17 g Oral Daily   senna-docusate  1 tablet Oral QHS   thiamine  100 mg Oral Daily   Continuous Infusions:  dextrose 5 % and 0.45 % NaCl with KCl 20 mEq/L 60 mL/hr at 11/09/23 0101   feeding supplement (OSMOLITE 1.5 CAL)     fluconazole (DIFLUCAN) IV 200 mg (11/09/23 1319)   levETIRAcetam 500 mg (11/09/23 1256)   potassium chloride 10 mEq (11/09/23 1421)   potassium PHOSPHATE IVPB (in mmol) 30 mmol (11/09/23 1116)   valproate sodium 250 mg (11/09/23 1003)   Labs Reviewed: Potassium 3.2, Phosphorus 1.5, Magnesium 1.4, BUN <5, Calcium 8.3,  CBG ranges from 57-138 mg/dL over the last 24 hours   Diet Order:   Diet Order             Diet regular Room service appropriate? No; Fluid consistency: Thin  Diet effective now  EDUCATION NEEDS:   Education needs have been addressed  Skin:  Skin Assessment: Reviewed RN Assessment  Last BM:  11/06/23, type 6, large  Height:   Ht Readings from Last 1 Encounters:  11/08/23 5\' 3"  (1.6 m)    Weight:   Wt Readings from Last 1 Encounters:  11/08/23 69 kg    Ideal Body Weight:  52.3 kg  BMI:  Body mass index is 26.95 kg/m.  Estimated Nutritional Needs:   Kcal:  1700-1900 kcal  Protein:  90-110 gm  Fluid:  >1.7L/day   Elliot Dally, RD Registered Dietitian  See Amion for more information

## 2023-11-09 NOTE — Procedures (Addendum)
 Cortrak  Tube Type:  Cortrak - 43 inches Tube Location:  Left nare Secured by: Bridle Technique Used to Measure Tube Placement:  Marking at nare/corner of mouth Cortrak Secured At:  80 cm   Cortrak Tube Team Note:  Consult received to place a Cortrak feeding tube.   X-ray is required. RN may begin using tube once confirmed by x-ray   If the tube becomes dislodged please keep the tube and contact the Cortrak team at [email or web address] for replacement.  If after hours and replacement cannot be delayed, place a NG tube and confirm placement with an abdominal x-ray.    Betsey Holiday MS, RD, LDN If unable to be reached, please send secure chat to "RD inpatient" available from 8:00a-4:00p daily

## 2023-11-10 DIAGNOSIS — N179 Acute kidney failure, unspecified: Secondary | ICD-10-CM | POA: Diagnosis not present

## 2023-11-10 DIAGNOSIS — R627 Adult failure to thrive: Secondary | ICD-10-CM | POA: Diagnosis not present

## 2023-11-10 DIAGNOSIS — N390 Urinary tract infection, site not specified: Secondary | ICD-10-CM | POA: Diagnosis not present

## 2023-11-10 DIAGNOSIS — G809 Cerebral palsy, unspecified: Secondary | ICD-10-CM | POA: Diagnosis not present

## 2023-11-10 DIAGNOSIS — E860 Dehydration: Secondary | ICD-10-CM | POA: Diagnosis not present

## 2023-11-10 DIAGNOSIS — Z515 Encounter for palliative care: Secondary | ICD-10-CM | POA: Diagnosis not present

## 2023-11-10 DIAGNOSIS — Z7189 Other specified counseling: Secondary | ICD-10-CM | POA: Diagnosis not present

## 2023-11-10 LAB — GLUCOSE, CAPILLARY
Glucose-Capillary: 103 mg/dL — ABNORMAL HIGH (ref 70–99)
Glucose-Capillary: 107 mg/dL — ABNORMAL HIGH (ref 70–99)
Glucose-Capillary: 110 mg/dL — ABNORMAL HIGH (ref 70–99)
Glucose-Capillary: 147 mg/dL — ABNORMAL HIGH (ref 70–99)
Glucose-Capillary: 87 mg/dL (ref 70–99)
Glucose-Capillary: 91 mg/dL (ref 70–99)

## 2023-11-10 LAB — BASIC METABOLIC PANEL WITH GFR
Anion gap: 5 (ref 5–15)
BUN: <5 mg/dL — ABNORMAL LOW (ref 6–20)
CO2: 26 mmol/L (ref 22–32)
Calcium: 8 mg/dL — ABNORMAL LOW (ref 8.9–10.3)
Chloride: 104 mmol/L (ref 98–111)
Creatinine, Ser: 0.9 mg/dL (ref 0.44–1.00)
GFR, Estimated: >60 mL/min (ref >60)
Glucose, Bld: 103 mg/dL — ABNORMAL HIGH (ref 70–99)
Potassium: 4.5 mmol/L (ref 3.5–5.1)
Sodium: 135 mmol/L (ref 135–145)

## 2023-11-10 LAB — MAGNESIUM: Magnesium: 1.9 mg/dL (ref 1.7–2.4)

## 2023-11-10 LAB — PHOSPHORUS: Phosphorus: 2.1 mg/dL — ABNORMAL LOW (ref 2.5–4.6)

## 2023-11-10 MED ORDER — POTASSIUM PHOSPHATES 15 MMOLE/5ML IV SOLN
30.0000 mmol | Freq: Once | INTRAVENOUS | Status: AC
  Administered 2023-11-10: 30 mmol via INTRAVENOUS
  Filled 2023-11-10: qty 10

## 2023-11-10 NOTE — Progress Notes (Signed)
 Daily Progress Note   Patient Name: Patricia White       Date: 11/10/2023 DOB: [birth year]  Age: 53 y.o. MRN#: [id number] Attending Physician: Nestor Ramp, MD Primary Care Physician: Celine Mans, MD Admit Date: 11/01/2023  Reason for Consultation/Follow-up: Establishing goals of care  Subjective: Patient more awake today than yesterday; sitting up in chair and talking to me. Aunt remains at bedside w/ no concerns.   Length of Stay: 6  Current Medications: Scheduled Meds:   atorvastatin  40 mg Oral Daily   Chlorhexidine Gluconate Cloth  6 each Topical Daily   enoxaparin (LOVENOX) injection  40 mg Subcutaneous Q24H   famotidine  20 mg Oral BID   feeding supplement  237 mL Oral BID BM   feeding supplement (PROSource TF20)  60 mL Per Tube Daily   multivitamin with minerals  1 tablet Oral Daily   pantoprazole (PROTONIX) IV  40 mg Intravenous Daily   polyethylene glycol  17 g Oral Daily   senna-docusate  1 tablet Oral QHS   sodium chloride flush  10-40 mL Intracatheter Q12H   sucralfate  1 g Oral TID WC & HS   thiamine  100 mg Oral Daily    Continuous Infusions:  feeding supplement (OSMOLITE 1.5 CAL) 30 mL/hr at 11/10/23 1041   fluconazole (DIFLUCAN) IV 200 mg (11/10/23 1048)   levETIRAcetam 500 mg (11/10/23 1040)   potassium PHOSPHATE IVPB (in mmol) 30 mmol (11/10/23 1111)   valproate sodium 250 mg (11/10/23 0529)    PRN Meds: acetaminophen **OR** acetaminophen, dextrose, LORazepam, sodium chloride flush  Physical Exam Constitutional:      General: She is not in acute distress.    Appearance: She is ill-appearing.     Comments: Sitting up in chair engaging in conversation  HENT:     Head:     Comments: Cortrak has been placed Pulmonary:     Effort: Pulmonary effort is normal.   Skin:    General: Skin is warm and dry.             Vital Signs: BP 107/79 (BP Location: Left Arm)   Pulse 96   Temp (!) 97.3 F (36.3 C)   Resp 17   Ht 5\' 3"  (1.6 m)   Wt 69 kg   SpO2 100%   BMI 26.95 kg/m  SpO2: SpO2: 100 % O2 Device: O2 Device: Room Air O2 Flow Rate:    Intake/output summary:  Intake/Output Summary (Last 24 hours) at 11/10/2023 1245 Last data filed at 11/10/2023 0538 Gross per 24 hour  Intake --  Output 1850 ml  Net -1850 ml   LBM: Last BM Date : 11/06/23 Baseline Weight: Weight: 75.4 kg Most recent weight: Weight: 69 kg       Palliative Assessment/Data: PPS 50%      Patient Active Problem List   Diagnosis Date Noted   Hypomagnesemia 11/09/2023   Hypophosphatemia 11/09/2023   Dehydration 11/08/2023   Gastritis and gastroduodenitis 11/08/2023   Gastric polyp 11/08/2023   Nausea without vomiting 11/08/2023   Protein-calorie malnutrition, severe 11/08/2023   Decreased appetite 11/05/2023   Lower urinary tract infectious disease 11/01/2023   Chronic health problem 11/01/2023  Oral thrush 11/01/2023   FTT (failure to thrive) in adult 11/01/2023   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   Hot flashes due to menopause 06/30/2021   Hyperlipidemia associated with type 2 diabetes mellitus (HCC) 11/24/2020   Diabetes mellitus without complication (HCC) 11/23/2020   Hypokalemia 11/23/2020   Angioedema 12/10/2019   Lesion of mandible 12/10/2019   Seasonal allergies 11/20/2019   Essential hypertension 10/23/2019   Seizure disorder (HCC) 02/23/2016   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   Obesity (BMI 30.0-34.9) 10/13/2009   Infantile cerebral palsy (HCC) 10/11/2006    Palliative Care Assessment & Plan   HPI: 53 year old female with past medical history cerebral palsy, GERD, seizures, hypertension, hyperlipidemia admitted for AKI, also with decreased appetite, nausea, vomiting, abdominal  pain.    Palliative care has been asked to support additional goals of care conversations.   RD has recommended coretrak placement d/t ongoing poor PO intake.   EGD 3/27 with moderate gastritis with a few erosions in gastric body and antrum  - on protonix and carafate.   Assessment: Follow-up today with patient and aunt at bedside. Patient is interactive today sitting in chair. Ate breakfast. Had cortrak placed.  Discussed situation with aunt who is pleased with patient's improved mental status and improved intake. We discussed cortrak can supplement nutrition for now as she has went for a while with poor nutrition. We did briefly discuss concern that patient was so sick for son long - difficult to know new baseline and how she will tolerate illness in the  future. Aunt reports understanding. For now, they wish to take it one day at a time and see how [name] recovers.   Recommendations/Plan: Intake and mental status improved Trial of cortrak PMT will follow DNR/DNI  Code Status: DNR  Care plan was discussed with patient's aunt and patient  Thank you for allowing the Palliative Medicine Team to assist in the care of this patient.   Total Time 35 minutes Prolonged Time Billed  no   Time spent includes: Detailed review of medical records (labs, imaging, vital signs), medically appropriate exam, discussion with treatment team, counseling and educating patient, family and/or staff, documenting clinical information, medication management and coordination of care.     *Please note that this is a verbal dictation therefore any spelling or grammatical errors are due to the "Dragon Medical One" system interpretation.  Gerlean Ren, DNP, [facility] Palliative Medicine Team Team Phone # [phone]  Pager [phone]

## 2023-11-10 NOTE — Assessment & Plan Note (Signed)
 GERD - As above Seizures - IV Depakote 500 mg BID, IV Keppra 100 mg BID. Holding home oral Tegretol and Topamax (cannot tolerate PO). HTN - Holding home BP meds HLD - Holding home Pravastatin 40mg , unable to take PO Constipation - Monitoring daily

## 2023-11-10 NOTE — Assessment & Plan Note (Signed)
 Mag improved this morning, monitoring closely for refeeding syndrome. -Daily AM Mag

## 2023-11-10 NOTE — Progress Notes (Signed)
 Orthopedic Tech Progress Note Patient Details:  Patricia White Feb 25, 1971 [id number]  [employer] order for an AFO was called in by Nida Boatman (ortho tech) yesterday, 3/28. As this pt already has an AFO, this will most likely be an AFO consult to determine which type of orthotic best suits the pt's needs as the current one is ill-fitting per PT notes.   Patient ID: Patricia White, female   DOB: [birth year], 53 y.o.   MRN: [id number]  Docia Furl 11/10/2023, 11:59 AM

## 2023-11-10 NOTE — Assessment & Plan Note (Addendum)
 May be improving, did eat today, still unable to examine closely. -Diflucan 200 mg IV daily (3/25-)

## 2023-11-10 NOTE — Assessment & Plan Note (Signed)
 Phos 2.1 this morning, replenished and monitoring closely for refeeding syndrome. -Daily AM Phosp

## 2023-11-10 NOTE — Assessment & Plan Note (Addendum)
 Unclear etiology, associated with nausea and abdominal pain.  Did have gastritis with few erosions on EGD.  S/p Cortrak placement 3/28. -Now off mIVF given Cortrak -Acetaminophen 650 mg Q6h PRN -PT/OT eval and treat -CBG checks Q4h -Elevate head of bed >30 degrees with feeds -AM CBC, CMP, Mag, Phos; monitor refeeding labs -GI following, appreciate recommendations  -Pantoprazole 40 mg IV daily -Carafate 1g suspension TID -RD following, appreciate recommendations for Cortrak: -Osmolite 1.5 KCal for total 1080 mL/day + Prosource TF20 60 mL daily = 1700 Kcal daily, 87 gm protein, 822 mL free water -100 mg thiamine, MVI, magic cup TID -Ensure Enlive PO BID -Palliative care following, appreciate recommendations

## 2023-11-10 NOTE — Plan of Care (Signed)

## 2023-11-10 NOTE — Progress Notes (Signed)
 Daily Progress Note Intern Pager: [phone]  Patient name: Patricia White Medical record number: [id number] Date of birth: 08-21-1970 Age: 53 y.o. Gender: female  Primary Care Provider: Celine Mans, MD Consultants: GI, ID, palliative Code Status: DNR limited  Pt Overview and Major Events to Date:  3/20 - Admitted, nystatin for oral thrush 3/24 - Rapid response called due to choking on medication 3/25 - Diflucan IV started for oral thrush 3/26 - PICC placed due to difficulty with IV placements 3/27 - EGD showing gastritis 3/28 - Cortrak placed  Assessment and Plan: Patricia White is a 53 y.o. female with a pertinent PMH of cerebral palsy, GERD, seizures, HTN, HLD who was initially admitted for AKI abdominal pain, currently with failure to thrive and continuing poor p.o. intake and EGD showing gastritis.  Cortrak placed 3/28, now receiving feeds.  Greatly improved mentation, energy and activity today.  P low at 2.1, replenished.  Will continue monitoring refeeding labs.  Will continue gradual feeds.  Need to examine skin tomorrow or Monday to ensure no pressure ulcers developing and optimize for discharge as patient begins feeding better. Assessment & Plan FTT (failure to thrive) in adult Unclear etiology, associated with nausea and abdominal pain.  Did have gastritis with few erosions on EGD.  S/p Cortrak placement 3/28. -Now off mIVF given Cortrak -Acetaminophen 650 mg Q6h PRN -PT/OT eval and treat -CBG checks Q4h -Elevate head of bed >30 degrees with feeds -AM CBC, CMP, Mag, Phos; monitor refeeding labs -GI following, appreciate recommendations  -Pantoprazole 40 mg IV daily -Carafate 1g suspension TID -RD following, appreciate recommendations for Cortrak: -Osmolite 1.5 KCal for total 1080 mL/day + Prosource TF20 60 mL daily = 1700 Kcal daily, 87 gm protein, 822 mL free water -100 mg thiamine, MVI, magic cup TID -Ensure Enlive PO BID -Palliative care following,  appreciate recommendations Oral thrush May be improving, did eat today, still unable to examine closely. -Diflucan 200 mg IV daily (3/25-) Hypomagnesemia Mag improved this morning, monitoring closely for refeeding syndrome. -Daily AM Mag Hypophosphatemia Phos 2.1 this morning, replenished and monitoring closely for refeeding syndrome. -Daily AM Phosp Chronic health problem GERD - As above Seizures - IV Depakote 500 mg BID, IV Keppra 100 mg BID. Holding home oral Tegretol and Topamax (cannot tolerate PO). HTN - Holding home BP meds HLD - Holding home Pravastatin 40mg , unable to take PO Constipation - Monitoring daily  FEN/GI: Cortrak for PO feeding, mIVF discontinued PPx: Lovenox Dispo: Home with home health pending clinical improvement and improved oral intake.  Subjective:  This morning, aunt reports that patient ate all of her breakfast sausage and half of her toast.  Also reports that patient has been more interactive.  Patient is able to state the gifts she wants for her birthday in May and states she does not have any pain.  Objective: Temp:  [97.3 F (36.3 C)-98.2 F (36.8 C)] 97.3 F (36.3 C) (03/29 0405) Pulse Rate:  [83-98] 96 (03/29 0748) Resp:  [17-19] 17 (03/29 0748) BP: (107-122)/(56-92) 107/79 (03/29 0748) SpO2:  [94 %-100 %] 100 % (03/29 0748)  Physical Exam: General: Alert and interactive, resting comfortably in bed, wearing soft mittens. HEENT:   Head: Normocephalic, atraumatic. Eyes: PERRLA. No conjunctival erythema or scleral injections. Mouth/Oral: MMM.  Cortrak in place. Cardiovascular: Regular rate and rhythm. Normal S1/S2. No murmurs, rubs, or gallops appreciated. 2+ radial pulses. Pulmonary: Clear bilaterally to auscultation. Normal WOB on room air, no accessory muscle usage. Abdominal: Normoactive  bowel sounds, nondistended. No tenderness to deep or light palpation. No rebound or guarding. Extremities: No peripheral edema bilaterally. Capillary  refill <2 seconds. Neuro: Alert. Interactive, playful. Speaks with ~50% speech intelligible. Follows simple instructions.  Laboratory: Most recent CBC Lab Results  Component Value Date   WBC 4.3 11/09/2023   HGB 9.0 (L) 11/09/2023   HCT 27.4 (L) 11/09/2023   MCV 93.8 11/09/2023   PLT 90 (L) 11/09/2023   Most recent BMP    Latest Ref Rng & Units 11/10/2023    3:00 AM  BMP  Glucose 70 - 99 mg/dL 811   BUN 6 - 20 mg/dL <5   Creatinine 9.14 - 1.00 mg/dL 7.82   Sodium 956 - 213 mmol/L 135   Potassium 3.5 - 5.1 mmol/L 4.5   Chloride 98 - 111 mmol/L 104   CO2 22 - 32 mmol/L 26   Calcium 8.9 - 10.3 mg/dL 8.0     Other pertinent labs: -P 2.1, Mag 1.9  New Imaging/Diagnostic Tests: -None  Patricia Christopher, MD 11/10/2023, 8:12 AM  PGY-1, [facility] FPTS Intern pager: [phone], text pages welcome Secure chat group [facility] Teaching Service

## 2023-11-10 NOTE — Assessment & Plan Note (Deleted)
 Intermittent throughout admission, resolved this AM. -Added KCl 20 mEq in mIVF*** -AM BMP -Replete electrolytes as needed

## 2023-11-10 NOTE — Progress Notes (Signed)
 Inpatient Rehab Admissions Coordinator:    I met with Pt. And auth at bedside to discuss CIR. They are interested and already provide 24/7. I will send case to insurance.   Megan Salon, MS, CCC-SLP Rehab Admissions Coordinator  [phone] (celll) [phone] (office)

## 2023-11-10 NOTE — Progress Notes (Signed)
 Mobility Specialist Progress Note:    11/10/23 1000  Mobility  Activity Transferred from bed to chair  Level of Assistance Moderate assist, patient does 50-74%  Assistive Device Other (Comment) (HHA)  Distance Ambulated (ft) 4 ft  Activity Response Tolerated well  Mobility Referral Yes  Mobility visit 1 Mobility  Mobility Specialist Start Time (ACUTE ONLY) 0945  Mobility Specialist Stop Time (ACUTE ONLY) 0954  Mobility Specialist Time Calculation (min) (ACUTE ONLY) 9 min   Pt received in bed, requesting assistance for soiled bed. Able to come EOB w/ increased time and no complaints. Required modA/HHA to stand/pivot to chair d/t posterior lean. Pt left in chair with call bell and chair alarm on. Family present and NT aware.  D'Vante Earlene Plater Mobility Specialist Please contact via Special educational needs teacher or Rehab office at [phone]

## 2023-11-11 DIAGNOSIS — R630 Anorexia: Secondary | ICD-10-CM

## 2023-11-11 DIAGNOSIS — E878 Other disorders of electrolyte and fluid balance, not elsewhere classified: Secondary | ICD-10-CM

## 2023-11-11 DIAGNOSIS — Z515 Encounter for palliative care: Secondary | ICD-10-CM | POA: Diagnosis not present

## 2023-11-11 DIAGNOSIS — D61818 Other pancytopenia: Secondary | ICD-10-CM | POA: Insufficient documentation

## 2023-11-11 DIAGNOSIS — E43 Unspecified severe protein-calorie malnutrition: Secondary | ICD-10-CM

## 2023-11-11 DIAGNOSIS — R627 Adult failure to thrive: Secondary | ICD-10-CM | POA: Diagnosis not present

## 2023-11-11 HISTORY — DX: Other disorders of electrolyte and fluid balance, not elsewhere classified: E87.8

## 2023-11-11 LAB — CBC
HCT: 24.7 % — ABNORMAL LOW (ref 36.0–46.0)
HCT: 24.9 % — ABNORMAL LOW (ref 36.0–46.0)
Hemoglobin: 7.9 g/dL — ABNORMAL LOW (ref 12.0–15.0)
Hemoglobin: 8 g/dL — ABNORMAL LOW (ref 12.0–15.0)
MCH: 30.5 pg (ref 26.0–34.0)
MCH: 31 pg (ref 26.0–34.0)
MCHC: 32 g/dL (ref 30.0–36.0)
MCHC: 32.1 g/dL (ref 30.0–36.0)
MCV: 95.4 fL (ref 80.0–100.0)
MCV: 96.5 fL (ref 80.0–100.0)
Platelets: 76 10*3/uL — ABNORMAL LOW (ref 150–400)
Platelets: 82 10*3/uL — ABNORMAL LOW (ref 150–400)
RBC: 2.59 MIL/uL — ABNORMAL LOW (ref 3.87–5.11)
RBC: 2.58 MIL/uL — ABNORMAL LOW (ref 3.87–5.11)
RDW: 13.4 % (ref 11.5–15.5)
RDW: 13.4 % (ref 11.5–15.5)
WBC: 3.3 10*3/uL — ABNORMAL LOW (ref 4.0–10.5)
WBC: 4.5 10*3/uL (ref 4.0–10.5)
nRBC: 0 % (ref 0.0–0.2)
nRBC: 0 % (ref 0.0–0.2)

## 2023-11-11 LAB — COMPREHENSIVE METABOLIC PANEL WITH GFR
ALT: 11 U/L (ref 0–44)
AST: 26 U/L (ref 15–41)
Albumin: 2.3 g/dL — ABNORMAL LOW (ref 3.5–5.0)
Alkaline Phosphatase: 60 U/L (ref 38–126)
Anion gap: 5 (ref 5–15)
BUN: 6 mg/dL (ref 6–20)
CO2: 27 mmol/L (ref 22–32)
Calcium: 8.2 mg/dL — ABNORMAL LOW (ref 8.9–10.3)
Chloride: 104 mmol/L (ref 98–111)
Creatinine, Ser: 0.94 mg/dL (ref 0.44–1.00)
GFR, Estimated: >60 mL/min (ref >60)
Glucose, Bld: 110 mg/dL — ABNORMAL HIGH (ref 70–99)
Potassium: 4.9 mmol/L (ref 3.5–5.1)
Sodium: 136 mmol/L (ref 135–145)
Total Bilirubin: 0.6 mg/dL (ref 0.0–1.2)
Total Protein: 6.3 g/dL — ABNORMAL LOW (ref 6.5–8.1)

## 2023-11-11 LAB — GLUCOSE, CAPILLARY
Glucose-Capillary: 106 mg/dL — ABNORMAL HIGH (ref 70–99)
Glucose-Capillary: 109 mg/dL — ABNORMAL HIGH (ref 70–99)
Glucose-Capillary: 136 mg/dL — ABNORMAL HIGH (ref 70–99)
Glucose-Capillary: 80 mg/dL (ref 70–99)
Glucose-Capillary: 89 mg/dL (ref 70–99)
Glucose-Capillary: 99 mg/dL (ref 70–99)

## 2023-11-11 LAB — PHOSPHORUS: Phosphorus: 2.5 mg/dL (ref 2.5–4.6)

## 2023-11-11 LAB — MAGNESIUM: Magnesium: 1.6 mg/dL — ABNORMAL LOW (ref 1.7–2.4)

## 2023-11-11 MED ORDER — MAGNESIUM SULFATE IN D5W 1-5 GM/100ML-% IV SOLN
1.0000 g | Freq: Once | INTRAVENOUS | Status: AC
  Administered 2023-11-11: 1 g via INTRAVENOUS
  Filled 2023-11-11: qty 100

## 2023-11-11 MED ORDER — MAGNESIUM SULFATE 2 GM/50ML IV SOLN
2.0000 g | Freq: Every day | INTRAVENOUS | Status: AC
  Administered 2023-11-11 – 2023-11-13 (×3): 2 g via INTRAVENOUS
  Filled 2023-11-11 (×3): qty 50

## 2023-11-11 NOTE — Assessment & Plan Note (Signed)
 All cell lines down, likely delusional.  No signs of infection or bleeding. -Repeat CBC

## 2023-11-11 NOTE — Progress Notes (Signed)
 Mobility Specialist Progress Note:    11/11/23 1100  Mobility  Activity Transferred from bed to chair  Level of Assistance Moderate assist, patient does 50-74%  Assistive Device Other (Comment) (HHA)  Distance Ambulated (ft) 4 ft  Activity Response Tolerated well  Mobility Referral Yes  Mobility visit 1 Mobility  Mobility Specialist Start Time (ACUTE ONLY) 0910  Mobility Specialist Stop Time (ACUTE ONLY) 0929  Mobility Specialist Time Calculation (min) (ACUTE ONLY) 19 min   Pt received in bed, eager for mobility. Able to come EOB w/ increased time. Required modA/HHA to stand and take steps to chair. No complaints throughout. Pt left in chair with call bell and chair alarm on. RN and family present.  D'Vante Earlene Plater Mobility Specialist Please contact via Special educational needs teacher or Rehab office at [phone]

## 2023-11-11 NOTE — Assessment & Plan Note (Signed)
 GERD - As above Seizures - IV Depakote 500 mg BID, IV Keppra 100 mg BID. Holding home oral Tegretol and Topamax (cannot tolerate PO). HTN - Holding home BP meds HLD - Holding home Pravastatin 40mg , unable to take PO Constipation - Monitoring daily

## 2023-11-11 NOTE — Assessment & Plan Note (Addendum)
 Improved, will d/c fluconazole given 5 day course (3/25-3/30)

## 2023-11-11 NOTE — Assessment & Plan Note (Addendum)
 Most likely secondary to infectious gastroenteritis with subsequent abdominal pain and gastritis per GI. S/p Cortrak placement 3/28.  Will continue discussion with family about long-term plan if unable to maintain nutrition via PO intake. -GI following, appreciate recommendations  -Pantoprazole 40 mg IV daily -Carafate 1g suspension TID -RD following, appreciate recommendations for Cortrak: -Osmolite 1.5 KCal for total 1080 mL/day + Prosource TF20 60 mL daily = 1700 Kcal daily, 87 gm protein, 822 mL free water -100 mg thiamine, MVI, magic cup TID -Ensure Enlive PO BID -Palliative care following, appreciate recommendations -PT/OT eval and treat -CBG checks Q4h -Elevate head of bed >30 degrees with feeds -AM CBC, CMP, Mag, Phos; monitor refeeding labs

## 2023-11-11 NOTE — Progress Notes (Signed)
 Brief Nutrition Note  RD received a consult to initiate a calorie count to assess pt PO intake in regards of hoping to be able to remove Cortrak feeding tube soon.   This RD working remotely at time of assessment. Pt was evaluated by in-person RD on 3/28 and Cortrak was placed same day. Pt currently on a regular diet, with one meal completion documented from today of 10%. Per Meds History, pt has declined all Ensure supplements for three days in a row.   Discussed with RN, pt only took a few bites for breakfast and drank a milk. Also reports that pt is not accepting the Ensure supplements.  At this time, I do not believe that a calorie count would be beneficial as pt PO intake has not improved significantly since Cortrak was placed two days ago. Recommend continuing current interventions and as pt PO intake improves significant consider completing a calorie count to assess for discontinuing Cortrak.    INTERVENTION:  Tube feeding via Cortrak tube: Osmolite 1.5 at 45 ml/h (1080 ml per day)  ProSource TF20 60 ml daily Provides 1700 kcal, 87 gm protein, 822 ml free water daily  Continue Ensure Enlive po BID, each supplement provides 350 kcal and 20 grams of protein. Continue Magic cup TID with meals, each supplement provides 290 kcal and 9 grams of protein 100 mg Thiamine  daily MVI with minerals  daily     RD team will continue to follow pt while admitted.  Kirby Crigler RD, LDN Clinical Dietitian

## 2023-11-11 NOTE — Progress Notes (Addendum)
 Daily Progress Note Intern Pager: [phone]  Patient name: Patricia White Medical record number: [id number] Date of birth: 1970/12/01 Age: 53 y.o. Gender: female  Primary Care Provider: Celine Mans, MD Consultants: GI, ID, palliative Code Status: DNR limited   Pt Overview and Major Events to Date:  3/20 - Admitted, nystatin for oral thrush 3/24 - Rapid response called due to choking on medication 3/25 - Diflucan IV started for oral thrush 3/26 - PICC placed due to difficulty with IV placements 3/27 - EGD showing gastritis 3/28 - Cortrak placed  Assessment and Plan: Patricia White is a 53 y.o. female with a pertinent PMH of cerebral palsy, GERD, seizures, HTN, HLD who was initially admitted for AKI abdominal pain, currently with failure to thrive and EGD showing gastritis.  Cortrak placed 3/28, now receiving feeds.  Assessment & Plan FTT (failure to thrive) in adult Most likely secondary to infectious gastroenteritis with subsequent abdominal pain and gastritis per GI. S/p Cortrak placement 3/28.  Will continue discussion with family about long-term plan if unable to maintain nutrition via PO intake. -GI following, appreciate recommendations  -Pantoprazole 40 mg IV daily -Carafate 1g suspension TID -RD following, appreciate recommendations for Cortrak: -Osmolite 1.5 KCal for total 1080 mL/day + Prosource TF20 60 mL daily = 1700 Kcal daily, 87 gm protein, 822 mL free water -100 mg thiamine, MVI, magic cup TID -Ensure Enlive PO BID -Palliative care following, appreciate recommendations -PT/OT eval and treat -CBG checks Q4h -Elevate head of bed >30 degrees with feeds -AM CBC, CMP, Mag, Phos; monitor refeeding labs Oral thrush Improved, will d/c fluconazole given 5 day course (3/25-3/30) Hypomagnesemia Repleted this AM -Daily AM Mag Hypophosphatemia Normal, will continue to monitor. -Daily AM Phosp Chronic health problem GERD - As above Seizures - IV Depakote 500  mg BID, IV Keppra 100 mg BID. Holding home oral Tegretol and Topamax (cannot tolerate PO). HTN - Holding home BP meds HLD - Holding home Pravastatin 40mg , unable to take PO Constipation - Monitoring daily Pancytopenia (HCC) All cell lines down, likely delusional.  No signs of infection or bleeding. -Repeat CBC  FEN/GI: Cortrak PPx: Lovenox Dispo: Home with home health pending clinical improvement and improved oral intake.  Subjective:  Assessed at bedside with aunt present.  Aunt denies any further episodes of vomiting.  Reports she had [place] toast and entire length of sausage but overall intake is declined since she got the feeding tube.  Has been persistently trying to pull the tube, and safety mit.  Objective: Temp:  [97.3 F (36.3 C)-98.3 F (36.8 C)] 98.3 F (36.8 C) (03/29 1941) Pulse Rate:  [83-110] 96 (03/29 1941) Resp:  [17-18] 18 (03/29 1941) BP: (101-108)/(56-79) 103/68 (03/29 1941) SpO2:  [100 %] 100 % (03/29 1941) Physical Exam: General: Sitting up in bed, minimal participation in conversation. Cardiovascular: RRR without murmur Respiratory: CTAB.  Normal WOB on room air Abdomen: Soft, some resistance to palpation.  Nondistended.  + BS. Extremities: No peripheral edema  Laboratory: Most recent CBC Lab Results  Component Value Date   WBC 4.3 11/09/2023   HGB 9.0 (L) 11/09/2023   HCT 27.4 (L) 11/09/2023   MCV 93.8 11/09/2023   PLT 90 (L) 11/09/2023   Most recent BMP    Latest Ref Rng & Units 11/10/2023    3:00 AM  BMP  Glucose 70 - 99 mg/dL 629   BUN 6 - 20 mg/dL <5   Creatinine 5.28 - 1.00 mg/dL 4.13   Sodium  135 - 145 mmol/L 135   Potassium 3.5 - 5.1 mmol/L 4.5   Chloride 98 - 111 mmol/L 104   CO2 22 - 32 mmol/L 26   Calcium 8.9 - 10.3 mg/dL 8.0     Other pertinent labs: Phos: 2.5 Mag: 1.6  Imaging/Diagnostic Tests: None in the past 24 hours  Elberta Fortis, MD 11/11/2023, 1:27 AM  PGY-2, [facility] FPTS Intern pager:  [phone], text pages welcome Secure chat group [facility] Teaching Service

## 2023-11-11 NOTE — Assessment & Plan Note (Addendum)
 Normal, will continue to monitor. -Daily AM Phosp

## 2023-11-11 NOTE — Progress Notes (Addendum)
   Palliative Medicine Inpatient Follow Up Note   HPI: 53 year old female with past medical history cerebral palsy, GERD, seizures, hypertension, hyperlipidemia admitted for AKI, also with decreased appetite, nausea, vomiting, abdominal pain.    Palliative care has been asked to support additional goals of care conversations.     Today's Discussion 11/11/2023  *Please note that this is a verbal dictation therefore any spelling or grammatical errors are due to the "Dragon Medical One" system interpretation.  Chart reviewed inclusive of vital signs, progress notes, laboratory results, and diagnostic images.   I met with Iisha, "[name]" and her aunt, [name] at bedside this morning. Patients grandmother, [name] on [place]. We reviewed that [name] is more alert and has been able to eat some. She apparently had a tough time with some bacon this morning therefore her breakfast was on hold until she felt better.  [name] does share that she is not longer having throat or stomach pain to me.   At this point the plan will be to continue present measures and ideally if [name] can take in sufficient PO's remove the coretrack.   Questions and concerns addressed/Palliative Support Provided.   Objective Assessment: Vital Signs Vitals:   11/11/23 0421 11/11/23 0758  BP: 109/73 113/63  Pulse: 91 95  Resp: 18 18  Temp: (!) 97.5 F (36.4 C) (!) 97.4 F (36.3 C)  SpO2: 100% 100%    Intake/Output Summary (Last 24 hours) at 11/11/2023 1035 Last data filed at 11/11/2023 0930 Gross per 24 hour  Intake 2341.96 ml  Output 2160 ml  Net 181.96 ml   Last Weight  Most recent update: 11/08/2023  3:27 PM    Weight  69 kg (152 lb 1.9 oz)            Gen: Elderly middle aged AA F chronically ill appearing HEENT: Dry mucous membranes, poor dentition, (+) white patches on tongue CV: Regular rate and rhythm  PULM: On RA, breathing even and non-labored ABD: Abdominal tenderness with deep  palpation EXT: No edema  Neuro: Alert to self  SUMMARY OF RECOMMENDATIONS   DNAR/DNI   Allow time for outcomes  Have requested nursing document PO's well   Ongoing PMT support  Billing based on MDM: Moderate  ______________________________________________________________________________________ Lamarr Lulas [place] Palliative Medicine Team Team Cell Phone: [phone] Please utilize secure chat with additional questions, if there is no response within 30 minutes please call the above phone number  Palliative Medicine Team providers are available by phone from 7am to 7pm daily and can be reached through the team cell phone.  Should this patient require assistance outside of these hours, please call the patient's attending physician.

## 2023-11-11 NOTE — Assessment & Plan Note (Addendum)
 Repleted this AM -Daily AM Mag

## 2023-11-11 NOTE — Plan of Care (Signed)

## 2023-11-12 DIAGNOSIS — R627 Adult failure to thrive: Secondary | ICD-10-CM | POA: Diagnosis not present

## 2023-11-12 DIAGNOSIS — Z7189 Other specified counseling: Secondary | ICD-10-CM | POA: Diagnosis not present

## 2023-11-12 DIAGNOSIS — R630 Anorexia: Secondary | ICD-10-CM | POA: Diagnosis not present

## 2023-11-12 DIAGNOSIS — Z515 Encounter for palliative care: Secondary | ICD-10-CM | POA: Diagnosis not present

## 2023-11-12 DIAGNOSIS — K2970 Gastritis, unspecified, without bleeding: Secondary | ICD-10-CM | POA: Diagnosis not present

## 2023-11-12 LAB — RENAL FUNCTION PANEL
Albumin: 2.3 g/dL — ABNORMAL LOW (ref 3.5–5.0)
Anion gap: 7 (ref 5–15)
BUN: 13 mg/dL (ref 6–20)
CO2: 25 mmol/L (ref 22–32)
Calcium: 8.2 mg/dL — ABNORMAL LOW (ref 8.9–10.3)
Chloride: 105 mmol/L (ref 98–111)
Creatinine, Ser: 0.91 mg/dL (ref 0.44–1.00)
GFR, Estimated: >60 mL/min (ref >60)
Glucose, Bld: 135 mg/dL — ABNORMAL HIGH (ref 70–99)
Phosphorus: 2.9 mg/dL (ref 2.5–4.6)
Potassium: 4.6 mmol/L (ref 3.5–5.1)
Sodium: 137 mmol/L (ref 135–145)

## 2023-11-12 LAB — CBC
HCT: 25.1 % — ABNORMAL LOW (ref 36.0–46.0)
Hemoglobin: 7.9 g/dL — ABNORMAL LOW (ref 12.0–15.0)
MCH: 30.5 pg (ref 26.0–34.0)
MCHC: 31.5 g/dL (ref 30.0–36.0)
MCV: 96.9 fL (ref 80.0–100.0)
Platelets: 97 10*3/uL — ABNORMAL LOW (ref 150–400)
RBC: 2.59 MIL/uL — ABNORMAL LOW (ref 3.87–5.11)
RDW: 13.7 % (ref 11.5–15.5)
WBC: 4.3 10*3/uL (ref 4.0–10.5)
nRBC: 0 % (ref 0.0–0.2)

## 2023-11-12 LAB — GLUCOSE, CAPILLARY
Glucose-Capillary: 105 mg/dL — ABNORMAL HIGH (ref 70–99)
Glucose-Capillary: 111 mg/dL — ABNORMAL HIGH (ref 70–99)
Glucose-Capillary: 112 mg/dL — ABNORMAL HIGH (ref 70–99)
Glucose-Capillary: 113 mg/dL — ABNORMAL HIGH (ref 70–99)
Glucose-Capillary: 116 mg/dL — ABNORMAL HIGH (ref 70–99)
Glucose-Capillary: 94 mg/dL (ref 70–99)

## 2023-11-12 LAB — IRON AND TIBC
Iron: 45 ug/dL (ref 28–170)
Saturation Ratios: 17 % (ref 10.4–31.8)
TIBC: 272 ug/dL (ref 250–450)
UIBC: 227 ug/dL

## 2023-11-12 LAB — BASIC METABOLIC PANEL WITH GFR
Anion gap: 7 (ref 5–15)
BUN: 11 mg/dL (ref 6–20)
CO2: 24 mmol/L (ref 22–32)
Calcium: 8.5 mg/dL — ABNORMAL LOW (ref 8.9–10.3)
Chloride: 105 mmol/L (ref 98–111)
Creatinine, Ser: 0.87 mg/dL (ref 0.44–1.00)
GFR, Estimated: >60 mL/min (ref >60)
Glucose, Bld: 123 mg/dL — ABNORMAL HIGH (ref 70–99)
Potassium: 5.1 mmol/L (ref 3.5–5.1)
Sodium: 136 mmol/L (ref 135–145)

## 2023-11-12 LAB — FERRITIN: Ferritin: 108 ng/mL (ref 11–307)

## 2023-11-12 LAB — VITAMIN B12: Vitamin B-12: 883 pg/mL (ref 180–914)

## 2023-11-12 LAB — PHOSPHORUS: Phosphorus: 1.7 mg/dL — ABNORMAL LOW (ref 2.5–4.6)

## 2023-11-12 LAB — MAGNESIUM: Magnesium: 1.9 mg/dL (ref 1.7–2.4)

## 2023-11-12 MED ORDER — PANTOPRAZOLE SODIUM 40 MG PO TBEC
40.0000 mg | DELAYED_RELEASE_TABLET | Freq: Every day | ORAL | Status: DC
  Administered 2023-11-13 – 2023-11-16 (×4): 40 mg via ORAL
  Filled 2023-11-12 (×4): qty 1

## 2023-11-12 MED ORDER — CARBAMAZEPINE 100 MG PO CHEW
300.0000 mg | CHEWABLE_TABLET | Freq: Two times a day (BID) | ORAL | Status: DC
  Administered 2023-11-12 – 2023-11-16 (×9): 300 mg via ORAL
  Filled 2023-11-12 (×10): qty 3

## 2023-11-12 MED ORDER — LIDOCAINE VISCOUS HCL 2 % MT SOLN: 15.0000 mL | OROMUCOSAL | Status: DC | PRN

## 2023-11-12 MED ORDER — TOPIRAMATE 25 MG PO TABS
75.0000 mg | ORAL_TABLET | Freq: Two times a day (BID) | ORAL | Status: DC
  Administered 2023-11-12 – 2023-11-16 (×9): 75 mg via ORAL
  Filled 2023-11-12 (×9): qty 3

## 2023-11-12 MED ORDER — SODIUM CHLORIDE 0.9 % IV SOLN
15.0000 mmol | Freq: Once | INTRAVENOUS | Status: AC
  Administered 2023-11-12: 15 mmol via INTRAVENOUS
  Filled 2023-11-12: qty 5

## 2023-11-12 NOTE — Assessment & Plan Note (Addendum)
 1.7 this AM; repleted. - Daily AM Phosphorus - replete as indicated

## 2023-11-12 NOTE — Progress Notes (Addendum)
   Palliative Medicine Inpatient Follow Up Note  HPI: 53 year old female with past medical history cerebral palsy, GERD, seizures, hypertension, hyperlipidemia admitted for AKI, also with decreased appetite, nausea, vomiting, abdominal pain.    Palliative care has been asked to support additional goals of care conversations.    Today's Discussion 11/12/2023  *Please note that this is a verbal dictation therefore any spelling or grammatical errors are due to the "Dragon Medical One" system interpretation.  Chart reviewed inclusive of vital signs, progress notes, laboratory results, and diagnostic images. Generally poor PO's < 5%.   I met with Jylian, "[name]" and her aunt, [name] at bedside this morning. Patients grandmother, [name] on [place]. She is overall responsive though slow to respond. We discussed that [name] is is still feeling pain in her throat and presses my arm away when pressing her abdomen.  Provided information and asked long term goals if patient continues to neglect to eat and drink. I did broach the topic of a g-tube and asked if this is something which would be desired noting that this is a form of life support. Patient grandmother shares she had never considered this. She remains hopeful for improvements.  I was able to assist in getting [name] up to the chair. She did surprisingly well and exhibited fair strength and ability to follow direction.   Per discussion with TOC patient is being evaluated by CIR.  Questions and concerns addressed/Palliative Support Provided.   Objective Assessment: Vital Signs Vitals:   11/12/23 0729 11/12/23 1143  BP: 105/74 95/60  Pulse: 94 84  Resp: 16 16  Temp: 98.2 F (36.8 C) 98 F (36.7 C)  SpO2: 99% 100%    Intake/Output Summary (Last 24 hours) at 11/12/2023 1154 Last data filed at 11/12/2023 1610 Gross per 24 hour  Intake 2092.22 ml  Output 2170 ml  Net -77.78 ml   Last Weight  Most recent update: 11/08/2023  3:27  PM    Weight  69 kg (152 lb 1.9 oz)            Gen: Elderly middle aged AA F chronically ill appearing HEENT: Dry mucous membranes, poor dentition CV: Regular rate and rhythm  PULM: On RA, breathing even and non-labored ABD: (+) Abdominal tenderness EXT: No edema  Neuro: Alert to self  SUMMARY OF RECOMMENDATIONS   DNAR/DNI   Allowing time for outcomes  Throat Pain: Viscous lidocaine for throat pain Q3H PRN  GI WU unrevealing Agree with Remeron consideration  Adult FTT: Presently has coretrack, have started conversations with family regarding patients condition if she continues to decline food/drink   TOC working on rehab placement options - CIR considering for rehabilitation   Ongoing PMT support  Billing based on MDM: High ______________________________________________________________________________________ Lamarr Lulas [place] Palliative Medicine Team Team Cell Phone: [phone] Please utilize secure chat with additional questions, if there is no response within 30 minutes please call the above phone number  Palliative Medicine Team providers are available by phone from 7am to 7pm daily and can be reached through the team cell phone.  Should this patient require assistance outside of these hours, please call the patient's attending physician.

## 2023-11-12 NOTE — Discharge Instructions (Signed)
 Dear [name],   Thank you for letting us participate in your care!  We are so glad you are feeling better.  You were admitted to the hospital for poor appetite which caused dehydration.  You were rehydrated with fluids to help heal your kidneys and correct your electrolyte imbalance.  You are also seen by our registered dietitians to help make a plan for successful feeding.  POST-HOSPITAL & CARE INSTRUCTIONS We recommend following up with your PCP within 1 week from being discharged from the hospital. Please let PCP/Specialists know of any changes in medications that were made which you will be able to see in the medications section of this packet.  DOCTOR'S APPOINTMENTS & FOLLOW UP Future Appointments  Date Time Provider Department Center  03/24/2024  1:40 PM [facility] ANNUAL WELLNESS VISIT [facility]     Thank you for choosing [facility]! Take care and be well!  Family Medicine Teaching Service Inpatient Team [place]  [facility]  [address] [place], [place] [address] [phone]

## 2023-11-12 NOTE — Assessment & Plan Note (Addendum)
 Magnesium 1.9 this AM. - Daily AM Mag

## 2023-11-12 NOTE — Progress Notes (Signed)
 Physical Therapy Treatment Patient Details Name: Patricia White MRN: [id number] DOB: 04/19/1971 Today's Date: 11/12/2023   History of Present Illness 53 y/o female presents to [facility] on 11/01/23 for hypotension and dehydration in setting of 3 days of cough, nausea and vomiting. Found to have AKI, hypokalemia in ED. PMH: Infantile CP with chronic L hemiparesis, cared for by aunt, seizure disorder, moderate ID, HTN, seasonal allergies, T2DM, HLD, obesity.    PT Comments  Pt received in supine, sleeping but awoken to verbal cues and agreeable to [employer] training with increased time and encouragement. Pt needing up to +2 modA to perform bed mobility and a couple steps away from bed with HHA until chair pulled up behind her due to pt's c/o fatigue. Pt mildly impulsive but follows most simple commands with some delay. Pt agreeable to sit up in recliner, family present in room. Patient will benefit from intensive inpatient follow-up therapy, >3 hours/day     If plan is discharge home, recommend the following: Assistance with cooking/housework;Assistance with feeding;Direct supervision/assist for medications management;Assist for transportation;Help with stairs or ramp for entrance;Supervision due to cognitive status;Two people to help with walking and/or transfers;A lot of help with bathing/dressing/bathroom;Direct supervision/assist for financial management   Can travel by private vehicle        Equipment Recommendations  None recommended by PT;Other (comment) (TBD pending progress)    Recommendations for Other Services       Precautions / Restrictions Precautions Precautions: Fall Recall of Precautions/Restrictions: Impaired Precaution/Restrictions Comments: had a fall the night of 3/20 per AM nurse Required Braces or Orthoses: Other Brace Other Brace: LLE AFO (in room and sneakers) Restrictions Weight Bearing Restrictions Per Provider Order: No     Mobility  Bed Mobility Overal bed  mobility: Needs Assistance Bed Mobility: Supine to Sit     Supine to sit: Mod assist, HOB elevated, Used rails     General bed mobility comments: Initially minA to raise trunk then modA to assist with anterior scooting and trunk stability, some posterior then anterior lean sitting EOB while adjusting posture/scooting.    Transfers Overall transfer level: Needs assistance Equipment used: 2 person hand held assist Transfers: Sit to/from Stand, Bed to chair/wheelchair/BSC Sit to Stand: Min assist, +2 physical assistance, +2 safety/equipment   Step pivot transfers: Mod assist, Min assist, +2 physical assistance       General transfer comment: pt stands well from EOB with only minA, but +2 HHA/forearm support on LLE for safety/steadying once upright and close chair follow for safety as pt quick to fatigue.    Ambulation/Gait Ambulation/Gait assistance: Mod assist, +2 physical assistance Gait Distance (Feet): 4 Feet Assistive device: 2 person hand held assist Gait Pattern/deviations: Step-to pattern, Decreased dorsiflexion - left, Decreased dorsiflexion - right, Decreased stride length, Leaning posteriorly, Narrow base of support       General Gait Details: quick to fatigue and posterior lean after ~22ft, chair pulled closer for her to sit down. SpO2/HR WFL on RA.   Stairs             Wheelchair Mobility     Tilt Bed    Modified Rankin (Stroke Patients Only)       Balance Overall balance assessment: Needs assistance Sitting-balance support: Feet supported, No upper extremity supported Sitting balance-Leahy Scale: Poor Sitting balance - Comments: Occasional anterior lean, needing CGA to minA to stabilize.   Standing balance support: Single extremity supported, Reliant on assistive device for balance Standing balance-Leahy Scale: Poor Standing balance  comment: +2 assist for dynamic standing tasks, +1 static standing                             Communication Communication Communication: Impaired Factors Affecting Communication: Reduced clarity of speech  Cognition Arousal: Alert Behavior During Therapy: Flat affect, Impulsive   PT - Cognitive impairments: Difficult to assess, Initiation, Sequencing, Problem solving, Safety/Judgement, Attention Difficult to assess due to: Impaired communication, Level of arousal                     PT - Cognition Comments: Development disability at baseline, but per Aunt who was present, pt tends to be more lethargic in AM while in hospital. With heavy encouragement and increased time to awaken, pt agreeable to participate and once more alert, pt moves better but can be slightly impulsive. Pt has difficulty expressing her needs 2/2 cognitive deficit which increases her fall risk due to overall poor safety awareness and insight into deficits. Following commands: Impaired Following commands impaired: Follows one step commands with increased time    Cueing Cueing Techniques: Verbal cues, Gestural cues, Tactile cues, Visual cues  Exercises General Exercises - Lower Extremity Long Arc Quad: AROM, Both, Seated, 5 reps (visual/verbal demo) Hip ABduction/ADduction: AROM, Both, 5 reps, Supine (abduction) Hip Flexion/Marching: AROM, Both, 5 reps, Seated (EOB)    General Comments General comments (skin integrity, edema, etc.): Pt deferred to don LLE AFO today but may do better for gait with it next session; Could consider orthostatic BP, pt standing <90 seconds at a time and no dinamap near room so not able to check this date.      Pertinent Vitals/Pain Pain Assessment Pain Assessment: Faces Faces Pain Scale: Hurts a little bit Breathing: normal Negative Vocalization: occasional moan/groan, low speech, negative/disapproving quality Facial Expression: smiling or inexpressive Body Language: relaxed Consolability: no need to console PAINAD Score: 1 Pain Descriptors / Indicators: Guarding Pain  Intervention(s): Limited activity within patient's tolerance, Monitored during session, Repositioned    Home Living                          Prior Function            PT Goals (current goals can now be found in the care plan section) Acute Rehab PT Goals Patient Stated Goal: To go home PT Goal Formulation: With patient/family Time For Goal Achievement: 11/17/23 Progress towards PT goals: Progressing toward goals    Frequency    Min 3X/week      PT Plan      Co-evaluation              AM-PAC PT "6 Clicks" Mobility   Outcome Measure  Help needed turning from your back to your side while in a flat bed without using bedrails?: A Little Help needed moving from lying on your back to sitting on the side of a flat bed without using bedrails?: A Lot Help needed moving to and from a bed to a chair (including a wheelchair)?: A Lot Help needed standing up from a chair using your arms (e.g., wheelchair or bedside chair)?: A Lot Help needed to walk in hospital room?: Total Help needed climbing 3-5 steps with a railing? : Total 6 Click Score: 11    End of Session Equipment Utilized During Treatment: Gait belt Activity Tolerance: Patient tolerated treatment well;Patient limited by fatigue Patient left: in chair;with call  bell/phone within reach;with chair alarm set;with family/visitor present;Other (comment) (aunt [name] in room with her) Nurse Communication: Mobility status;Need for lift equipment;Other (comment) (safer with Stedy for transfers, can ask mobility specialist to assist.) PT Visit Diagnosis: Other abnormalities of gait and mobility (R26.89)     Time: 5284-1324 PT Time Calculation (min) (ACUTE ONLY): 25 min  Charges:    $Therapeutic Exercise: 8-22 mins $Therapeutic Activity: 8-22 mins PT General Charges $$ ACUTE PT VISIT: 1 Visit                     Regene Mccarthy P., PTA Acute Rehabilitation Services Secure Chat Preferred 9a-5:30pm Office:  [phone]    Dorathy Kinsman [facility] 11/12/2023, 3:55 PM

## 2023-11-12 NOTE — Assessment & Plan Note (Addendum)
 GERD - As above Seizures -patient can tolerate p.o. and will restart home regimen Tegretol 300 mg BID, Topamax 75 mg BID; discontinue IV Depacon and Keppra. HTN - Holding home BP meds HLD - Holding home Pravastatin 40mg , unable to take PO Constipation - Miralax daily, senna daily. Monitoring daily. Oral thrush-present on admission and treated with fluconazole x 5 days (3/25 - 3/30); now resolved.

## 2023-11-12 NOTE — Plan of Care (Signed)

## 2023-11-12 NOTE — Progress Notes (Signed)
      Progress Note   Subjective  Family at bedside. Said she ate "some" yesterday but not a lot. Patient pleasant in bed this AM. She denies any pain   Objective   Vital signs in last 24 hours: Temp:  [97.7 F (36.5 C)-98.2 F (36.8 C)] 98.2 F (36.8 C) (03/31 0729) Pulse Rate:  [94-97] 94 (03/31 0729) Resp:  [16-18] 16 (03/31 0729) BP: (103-113)/(69-83) 105/74 (03/31 0729) SpO2:  [91 %-100 %] 99 % (03/31 0729) Last BM Date : 11/09/23 General:    AA female in NAD, sitting in bed, Cortrack in place  Intake/Output from previous day: 03/30 0701 - 03/31 0700 In: 2362.2 [P.O.:560; ZO/XW:9604.5; IV Piggyback:564.7] Out: 2170 [Urine:2170] Intake/Output this shift: No intake/output data recorded.  Lab Results: Recent Labs    11/11/23 0313 11/11/23 1315 11/12/23 0419  WBC 3.3* 4.5 4.3  HGB 7.9* 8.0* 7.9*  HCT 24.7* 24.9* 25.1*  PLT 76* 82* 97*   BMET Recent Labs    11/10/23 0300 11/11/23 0313 11/12/23 0419  NA 135 136 136  K 4.5 4.9 5.1  CL 104 104 105  CO2 26 27 24   GLUCOSE 103* 110* 123*  BUN <5* 6 11  CREATININE 0.90 0.94 0.87  CALCIUM 8.0* 8.2* 8.5*   LFT Recent Labs    11/11/23 0313  PROT 6.3*  ALBUMIN 2.3*  AST 26  ALT 11  ALKPHOS 60  BILITOT 0.6   PT/INR No results for input(s): "LABPROT", "INR" in the last 72 hours.  Studies/Results: No results found.     Assessment / Plan:    53 y/o female with cerebral palsy here with the following:  Failure to thrive Poor appetite Nausea Gastritis AKI - resolved  CT scan abdomen pelvis, [place] without clear cause. EGD showed gastritis and a benign hyperplastic polyp that was removed. Biopsies negative for H pylori. On protonix BID and carafate. Her pain has resolved. Taking in some limited PO, core track in place. Leading thought was that perhaps she had an infectious gastroenteritis with some functional bowel change / decreased appetite. Cortrack in place for nutrition.  Continue PPI and carafate  for now. She otherwise has had a pretty thorough workup thus far without concerning pathology otherwise. Could consider appetite stimulant such as Remeron, defer that to primary team. No other additional workup planned at this time. Continue supportive measures, hopefully she improves with time. Once PO intake improving more can pull Cortrack.   We will sign off for now, please call with additional questions moving forward.  Harlin Rain, MD [facility]

## 2023-11-12 NOTE — Progress Notes (Signed)
 Occupational Therapy Treatment Patient Details Name: Patricia White MRN: [id number] DOB: 1970-11-06 Today's Date: 11/12/2023   History of present illness 53 y/o female presents to [facility] on 11/01/23 for hypotension and dehydration in setting of 3 days of cough, nausea and vomiting. Found to have AKI, hypokalemia in ED. PMH: Infantile CP with chronic L hemiparesis, cared for by aunt, seizure disorder, moderate ID, HTN, seasonal allergies, T2DM, HLD, obesity.   OT comments  Pt making progress with functional goals. Pt's in chair upon arrival from earlier PT session. Pt's aunt present and supportive. Pt agreeable to participate in grooming, UB ADLs and simulate bathing tasks, sit - stand with RW to SPT to EOB and back to chair. OT will continue to follow acutely to maximize level of function and safety      If plan is discharge home, recommend the following:  A little help with bathing/dressing/bathroom;Assistance with cooking/housework;Supervision due to cognitive status;Direct supervision/assist for financial management;Direct supervision/assist for medications management   Equipment Recommendations  None recommended by OT    Recommendations for Other Services      Precautions / Restrictions Precautions Precautions: Fall Recall of Precautions/Restrictions: Impaired Required Braces or Orthoses: Other Brace Other Brace: LLE AFO (in room and sneakers) Restrictions Weight Bearing Restrictions Per Provider Order: No       Mobility Bed Mobility               General bed mobility comments: pt in chair    Transfers Overall transfer level: Needs assistance Equipment used: Ambulation equipment used Transfers: Sit to/from Stand, Bed to chair/wheelchair/BSC Sit to Stand: Min assist           General transfer comment: cues for correct hand placement with R UE     Balance Overall balance assessment: Needs assistance Sitting-balance support: Feet supported, No upper extremity  supported Sitting balance-Leahy Scale: Fair     Standing balance support: Single extremity supported, Reliant on assistive device for balance Standing balance-Leahy Scale: Poor                             ADL either performed or assessed with clinical judgement   ADL Overall ADL's : Needs assistance/impaired     Grooming: Wash/dry hands;Wash/dry face;Contact guard assist;Sitting Grooming Details (indicate cue type and reason): using R UE                 Toilet Transfer: Contact guard assist;Stand-pivot Statistician Details (indicate cue type and reason): simulated chair - EOB, back to chair. pt declined taking steps tp BSC Toileting- Clothing Manipulation and Hygiene: Contact guard assist;Sitting/lateral lean Toileting - Clothing Manipulation Details (indicate cue type and reason): simulated            Extremity/Trunk Assessment Upper Extremity Assessment Upper Extremity Assessment: LUE deficits/detail LUE Deficits / Details: Congential CP, L hemi, L elbow flexion/ext intact   Lower Extremity Assessment Lower Extremity Assessment: Defer to PT evaluation   Cervical / Trunk Assessment Cervical / Trunk Assessment: Normal    Vision Patient Visual Report: No change from baseline     Perception     Praxis     Communication Communication Communication: Impaired Factors Affecting Communication: Reduced clarity of speech   Cognition Arousal: Alert Behavior During Therapy: WFL for tasks assessed/performed, Flat affect Cognition: History of cognitive impairments             OT - Cognition Comments: 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  Following commands: Impaired Following commands impaired: Follows one step commands with increased time, Only follows one step commands consistently      Cueing   Cueing Techniques: Verbal cues, Gestural cues, Tactile cues, Visual cues  Exercises      Shoulder Instructions        General Comments      Pertinent Vitals/ Pain       Pain Assessment Pain Assessment: No/denies pain Pain Score: 0-No pain Pain Intervention(s): Monitored during session, Repositioned  Home Living                                          Prior Functioning/Environment              Frequency  Min 2X/week        Progress Toward Goals  OT Goals(current goals can now be found in the care plan section)  Progress towards OT goals: Progressing toward goals     Plan      Co-evaluation                 AM-PAC OT "6 Clicks" Daily Activity     Outcome Measure   Help from another person eating meals?: A Little Help from another person taking care of personal grooming?: A Little Help from another person toileting, which includes using toliet, bedpan, or urinal?: A Little Help from another person bathing (including washing, rinsing, drying)?: A Little Help from another person to put on and taking off regular upper body clothing?: A Lot Help from another person to put on and taking off regular lower body clothing?: A Little 6 Click Score: 17    End of Session Equipment Utilized During Treatment: Gait belt;Rolling walker (2 wheels)  OT Visit Diagnosis: Unsteadiness on feet (R26.81);History of falling (Z91.81);Muscle weakness (generalized) (M62.81)   Activity Tolerance Patient limited by fatigue   Patient Left with call bell/phone within reach;with family/visitor present;in chair;with chair alarm set   Nurse Communication          Time: 1249-1310 OT Time Calculation (min): 21 min  Charges: OT General Charges $OT Visit: 1 Visit OT Treatments $Therapeutic Activity: 8-22 mins    Galen Manila 11/12/2023, 2:46 PM

## 2023-11-12 NOTE — Assessment & Plan Note (Addendum)
 No overt signs of infection or bleeding.  Patient does have history of anemia. - Will obtain iron studies, B12 to evaluate -Will hold Lovenox at this time as a precaution against bleed; place SCDs - AM CBC

## 2023-11-12 NOTE — Progress Notes (Addendum)
 Daily Progress Note Intern Pager: [phone]  Patient name: Patricia White Medical record number: [id number] Date of birth: 1970-09-18 Age: 53 y.o. Gender: female  Primary Care Provider: Celine Mans, MD Consultants: GI (s/o), ID, palliative Code Status: DNR limited   Pt Overview and Major Events to Date:  3/20 - Admitted, nystatin for oral thrush 3/24 - Rapid response called due to choking on medication 3/25 - Diflucan IV started for oral thrush 3/26 - PICC placed due to difficulty with IV placements 3/27 - EGD showing gastritis 3/28 - Cortrak placed  Assessment and Plan: Patricia White is a 53 year old female with history of cerebral palsy, seizures, HTN, initially admitted for AKI and abdominal pain.  Currently with failure to thrive, EGD with gastritis.  Cortrak was placed and patient is now receiving feeds.  She is tolerating p.o. and appropriate to restart p.o. medications.  Assessment & Plan FTT (failure to thrive) in adult Most likely secondary to infectious gastroenteritis with subsequent abdominal pain and gastritis per GI. S/p Cortrak placement 3/28.  Patient is now able to PO, so we will switch all medications to PO from IV. -GI signing off now, recommendations as below:  -Pantoprazole 40 mg, Carafate 1g suspension TID -RD following, appreciate recommendations for Cortrak: -Osmolite 1.5 KCal for total 1080 mL/day + Prosource TF20 60 mL daily = 1700 Kcal daily, 87 gm protein, 822 mL free water -100 mg thiamine, MVI, magic cup TID -Ensure Enlive PO BID -Palliative care following, appreciate recommendations -PT/OT eval and treat -CBG checks Q4h -Elevate head of bed >30 degrees with feeds -AM CBC, RFP, Mag; monitor refeeding labs. Hypomagnesemia Magnesium 1.9 this AM. - Daily AM Mag Hypophosphatemia 1.7 this AM; repleted. - Daily AM Phosphorus - replete as indicated Pancytopenia (HCC) No overt signs of infection or bleeding.  Patient does have history of  anemia. - Will obtain iron studies, B12 to evaluate -Will hold Lovenox at this time as a precaution against bleed; place SCDs - AM CBC Chronic health problem GERD - As above Seizures -patient can tolerate p.o. and will restart home regimen Tegretol 300 mg BID, Topamax 75 mg BID; discontinue IV Depacon and Keppra. HTN - Holding home BP meds HLD - Holding home Pravastatin 40mg , unable to take PO Constipation - Miralax daily, senna daily. Monitoring daily. Oral thrush-present on admission and treated with fluconazole x 5 days (3/25 - 3/30); now resolved.   FEN/GI: Regular diet; Cortrak in place with nutrition as above PPx: SCDs Dispo:Pending PT recommendations  pending clinical improvement .   Subjective:  Patient seen this morning sitting comfortably in bed with aunt at bedside.  She denies pain.  She hopes to go home soon so that she can see her grandmother.  Objective: Temp:  [97.4 F (36.3 C)-98.2 F (36.8 C)] 98.2 F (36.8 C) (03/31 0729) Pulse Rate:  [94-97] 94 (03/31 0729) Resp:  [16-18] 16 (03/31 0729) BP: (103-113)/(63-83) 105/74 (03/31 0729) SpO2:  [91 %-100 %] 99 % (03/31 0729) Physical Exam: General: Sitting comfortably in bed, somewhat more alert and interactive than prior exams.  NAD. Cardiovascular: RRR, no murmurs Respiratory: CTA bilaterally, normal work of breathing.  Cortrak in place. Abdomen: Normoactive bowel sounds, soft, nontender Extremities: Moves all equally.  No LE edema.  Laboratory: Most recent CBC Lab Results  Component Value Date   WBC 4.3 11/12/2023   HGB 7.9 (L) 11/12/2023   HCT 25.1 (L) 11/12/2023   MCV 96.9 11/12/2023   PLT 97 (L) 11/12/2023  Most recent BMP    Latest Ref Rng & Units 11/12/2023    4:19 AM  BMP  Glucose 70 - 99 mg/dL 409   BUN 6 - 20 mg/dL 11   Creatinine 8.11 - 1.00 mg/dL 9.14   Sodium 782 - 956 mmol/L 136   Potassium 3.5 - 5.1 mmol/L 5.1   Chloride 98 - 111 mmol/L 105   CO2 22 - 32 mmol/L 24   Calcium 8.9 -  10.3 mg/dL 8.5     Cyndia Skeeters, DO 11/12/2023, 7:36 AM  PGY-1, [facility] Family Medicine FPTS Intern pager: [phone], text pages welcome Secure chat group [facility] Teaching Service

## 2023-11-12 NOTE — Assessment & Plan Note (Addendum)
 Most likely secondary to infectious gastroenteritis with subsequent abdominal pain and gastritis per GI. S/p Cortrak placement 3/28.  Patient is now able to PO, so we will switch all medications to PO from IV. -GI signing off now, recommendations as below:  -Pantoprazole 40 mg, Carafate 1g suspension TID -RD following, appreciate recommendations for Cortrak: -Osmolite 1.5 KCal for total 1080 mL/day + Prosource TF20 60 mL daily = 1700 Kcal daily, 87 gm protein, 822 mL free water -100 mg thiamine, MVI, magic cup TID -Ensure Enlive PO BID -Palliative care following, appreciate recommendations -PT/OT eval and treat -CBG checks Q4h -Elevate head of bed >30 degrees with feeds -AM CBC, RFP, Mag; monitor refeeding labs.

## 2023-11-12 NOTE — Plan of Care (Signed)

## 2023-11-13 DIAGNOSIS — K2970 Gastritis, unspecified, without bleeding: Secondary | ICD-10-CM | POA: Diagnosis not present

## 2023-11-13 DIAGNOSIS — Z7189 Other specified counseling: Secondary | ICD-10-CM | POA: Diagnosis not present

## 2023-11-13 DIAGNOSIS — R630 Anorexia: Secondary | ICD-10-CM | POA: Diagnosis not present

## 2023-11-13 DIAGNOSIS — K2990 Gastroduodenitis, unspecified, without bleeding: Secondary | ICD-10-CM

## 2023-11-13 DIAGNOSIS — E860 Dehydration: Secondary | ICD-10-CM | POA: Diagnosis not present

## 2023-11-13 DIAGNOSIS — D61818 Other pancytopenia: Secondary | ICD-10-CM

## 2023-11-13 DIAGNOSIS — Z515 Encounter for palliative care: Secondary | ICD-10-CM | POA: Diagnosis not present

## 2023-11-13 DIAGNOSIS — E43 Unspecified severe protein-calorie malnutrition: Secondary | ICD-10-CM | POA: Diagnosis not present

## 2023-11-13 LAB — CBC
HCT: 24.5 % — ABNORMAL LOW (ref 36.0–46.0)
Hemoglobin: 7.6 g/dL — ABNORMAL LOW (ref 12.0–15.0)
MCH: 30.5 pg (ref 26.0–34.0)
MCHC: 31 g/dL (ref 30.0–36.0)
MCV: 98.4 fL (ref 80.0–100.0)
Platelets: 119 10*3/uL — ABNORMAL LOW (ref 150–400)
RBC: 2.49 MIL/uL — ABNORMAL LOW (ref 3.87–5.11)
RDW: 13.6 % (ref 11.5–15.5)
WBC: 3.8 10*3/uL — ABNORMAL LOW (ref 4.0–10.5)
nRBC: 0 % (ref 0.0–0.2)

## 2023-11-13 LAB — RENAL FUNCTION PANEL
Albumin: 2.4 g/dL — ABNORMAL LOW (ref 3.5–5.0)
Albumin: 2.4 g/dL — ABNORMAL LOW (ref 3.5–5.0)
Anion gap: 6 (ref 5–15)
Anion gap: 8 (ref 5–15)
BUN: 15 mg/dL (ref 6–20)
BUN: 16 mg/dL (ref 6–20)
CO2: 24 mmol/L (ref 22–32)
CO2: 23 mmol/L (ref 22–32)
Calcium: 8.2 mg/dL — ABNORMAL LOW (ref 8.9–10.3)
Calcium: 8.3 mg/dL — ABNORMAL LOW (ref 8.9–10.3)
Chloride: 103 mmol/L (ref 98–111)
Chloride: 101 mmol/L (ref 98–111)
Creatinine, Ser: 0.93 mg/dL (ref 0.44–1.00)
Creatinine, Ser: 0.88 mg/dL (ref 0.44–1.00)
GFR, Estimated: >60 mL/min (ref >60)
GFR, Estimated: >60 mL/min (ref >60)
Glucose, Bld: 120 mg/dL — ABNORMAL HIGH (ref 70–99)
Glucose, Bld: 117 mg/dL — ABNORMAL HIGH (ref 70–99)
Phosphorus: 2.6 mg/dL (ref 2.5–4.6)
Phosphorus: 2 mg/dL — ABNORMAL LOW (ref 2.5–4.6)
Potassium: 4.8 mmol/L (ref 3.5–5.1)
Potassium: 4.7 mmol/L (ref 3.5–5.1)
Sodium: 133 mmol/L — ABNORMAL LOW (ref 135–145)
Sodium: 132 mmol/L — ABNORMAL LOW (ref 135–145)

## 2023-11-13 LAB — GLUCOSE, CAPILLARY
Glucose-Capillary: 103 mg/dL — ABNORMAL HIGH (ref 70–99)
Glucose-Capillary: 111 mg/dL — ABNORMAL HIGH (ref 70–99)
Glucose-Capillary: 120 mg/dL — ABNORMAL HIGH (ref 70–99)
Glucose-Capillary: 124 mg/dL — ABNORMAL HIGH (ref 70–99)
Glucose-Capillary: 125 mg/dL — ABNORMAL HIGH (ref 70–99)
Glucose-Capillary: 135 mg/dL — ABNORMAL HIGH (ref 70–99)
Glucose-Capillary: 144 mg/dL — ABNORMAL HIGH (ref 70–99)

## 2023-11-13 LAB — MAGNESIUM: Magnesium: 2.1 mg/dL (ref 1.7–2.4)

## 2023-11-13 MED ORDER — SODIUM PHOSPHATES 45 MMOLE/15ML IV SOLN
10.0000 mmol | Freq: Once | INTRAVENOUS | Status: AC
  Administered 2023-11-13: 10 mmol via INTRAVENOUS
  Filled 2023-11-13: qty 3.33

## 2023-11-13 MED ORDER — SUCRALFATE 1 GM/10ML PO SUSP
1.0000 g | Freq: Three times a day (TID) | ORAL | Status: DC
  Administered 2023-11-13 – 2023-11-16 (×15): 1 g via ORAL
  Filled 2023-11-13 (×15): qty 10

## 2023-11-13 NOTE — Plan of Care (Signed)
  Problem: Clinical Measurements: Goal: Diagnostic test results will improve Outcome: Progressing Goal: Respiratory complications will improve Outcome: Progressing Goal: Cardiovascular complication will be avoided Outcome: Progressing   Problem: Clinical Measurements: Goal: Diagnostic test results will improve Outcome: Progressing   Problem: Clinical Measurements: Goal: Respiratory complications will improve Outcome: Progressing   Problem: Clinical Measurements: Goal: Cardiovascular complication will be avoided Outcome: Progressing   Problem: Activity: Goal: Risk for activity intolerance will decrease Outcome: Progressing   Problem: Nutrition: Goal: Adequate nutrition will be maintained Outcome: Progressing   Problem: Coping: Goal: Level of anxiety will decrease Outcome: Progressing   Problem: Elimination: Goal: Will not experience complications related to bowel motility Outcome: Progressing Goal: Will not experience complications related to urinary retention Outcome: Progressing   Problem: Pain Managment: Goal: General experience of comfort will improve and/or be controlled Outcome: Progressing   Problem: Safety: Goal: Ability to remain free from injury will improve Outcome: Progressing   Problem: Skin Integrity: Goal: Risk for impaired skin integrity will decrease Outcome: Progressing

## 2023-11-13 NOTE — Progress Notes (Signed)
   Palliative Medicine Inpatient Follow Up Note  HPI: 53 year old female with past medical history cerebral palsy, GERD, seizures, hypertension, hyperlipidemia admitted for AKI, also with decreased appetite, nausea, vomiting, abdominal pain.    Palliative care has been asked to support additional goals of care conversations.    Today's Discussion 11/13/2023  *Please note that this is a verbal dictation therefore any spelling or grammatical errors are due to the "Dragon Medical One" system interpretation.  Chart reviewed inclusive of vital signs, progress notes, laboratory results, and diagnostic images. Generally poor PO's still.   I met with Patricia White, "[name]" and her aunt, [name] at bedside this morning.She is sitting up on the commode. She endorses ongoing pain in her throat. I did request that nursing give her the lidocaine to help with this.   Per patients aunt she is eating though very little. It does appear that by the day the amount of spoonfuls she has taken in have increased. Patients aunt still expresses hope for ongoing nutritional advancement.  Plan to allow time for outcomes. Have spoke to patients aunt and grandmother in regards to long term goals. Patients family would like to take her home once medically optimized.  Questions and concerns addressed/Palliative Support Provided.   Objective Assessment: Vital Signs Vitals:   11/13/23 1036 11/13/23 1139  BP: 127/85 (!) 111/58  Pulse: (!) 113 (!) 103  Resp:  18  Temp: 97.9 F (36.6 C) 98.4 F (36.9 C)  SpO2: 100% 100%    Intake/Output Summary (Last 24 hours) at 11/13/2023 1207 Last data filed at 11/13/2023 0503 Gross per 24 hour  Intake 930.14 ml  Output 1900 ml  Net -969.86 ml   Last Weight  Most recent update: 11/08/2023  3:27 PM    Weight  69 kg (152 lb 1.9 oz)            Gen: Elderly middle aged AA F chronically ill appearing HEENT: Dry mucous membranes, poor dentition CV: Regular rate and rhythm  PULM: On  RA, breathing even and non-labored ABD: (+) Abdominal tenderness EXT: No edema  Neuro: Alert to self  SUMMARY OF RECOMMENDATIONS   DNAR/DNI   Allowing time for outcomes  Throat Pain: Viscous lidocaine for throat pain Q3H PRN  GI WU unrevealing Agree with Remeron consideration  Adult FTT: Presently has coretrack, have started conversations with family regarding patients condition if she continues to decline food/drink   Patients family would like to take home  Ongoing PMT support  Billing based on MDM: High ______________________________________________________________________________________ Lamarr Lulas [place] Palliative Medicine Team Team Cell Phone: [phone] Please utilize secure chat with additional questions, if there is no response within 30 minutes please call the above phone number  Palliative Medicine Team providers are available by phone from 7am to 7pm daily and can be reached through the team cell phone.  Should this patient require assistance outside of these hours, please call the patient's attending physician.

## 2023-11-13 NOTE — Assessment & Plan Note (Addendum)
 Likely 2/2 infectious gastroenteritis --> gastritis. Cortrak 3/28. Began tolerating PO medications yesterday. Per family, increasing amount of spoonfuls eaten.  - Continue GI recs: pantoprazole 40 mg, Carafate 1g TID - RD following, Cortrak recs as below:  -Osmolite 1.5 KCal for total 1080 mL/day + Prosource TF20 60 mL daily = 1700 Kcal daily, 87 gm protein, 822 mL free water -100 mg thiamine, MVI, magic cup TID -Ensure Enlive PO BID - Appreciate palliative care recommendations - Appreciate PT/OT recommendations - CBG checks q4h - Elevated head of bed >30 degrees with feeds - AM CBC, RFP

## 2023-11-13 NOTE — Progress Notes (Signed)
 Mobility Specialist: Progress Note   11/13/23 1435  Mobility  Activity Transferred from bed to chair  Level of Assistance +2 (takes two people)  Assistive Device Other (Comment) (HHA)  Activity Response Tolerated well  Mobility Referral Yes  Mobility visit 1 Mobility  Mobility Specialist Start Time (ACUTE ONLY) 1115  Mobility Specialist Stop Time (ACUTE ONLY) 1136  Mobility Specialist Time Calculation (min) (ACUTE ONLY) 21 min    Pt was agreeable to mobility session - received in bed. Had a void in bed at the very beginning of session. MinA+2 for bed mobility to assist with scooting and trunk elevation. MinA+2 for STS and stand pivot with HHA. Left in chair with all needs met, call bell in reach. Family in room.   Maurene Capes Mobility Specialist Please contact via SecureChat or Rehab office at [phone]

## 2023-11-13 NOTE — Assessment & Plan Note (Signed)
 WNL this AM 1.9 --> 2.1  - Magnesium labs qAM

## 2023-11-13 NOTE — Progress Notes (Signed)
 Inpatient Rehab Admissions Coordinator:    Pt.'s family states that they now feel that pt. Would do better to d/c directly home once medically stable. I will not pursue for CIR.   Megan Salon, MS, CCC-SLP Rehab Admissions Coordinator  [phone] (celll) [phone] (office)

## 2023-11-13 NOTE — Assessment & Plan Note (Addendum)
 Platelets improved 97 --> 119. WBC/RBC/Hgb continues downtrending.  - Ordered CBC w/diff and reticulocyte count - Iron/TIBC, ferritin and B12 WNL. - Continue SCDs

## 2023-11-13 NOTE — Plan of Care (Signed)

## 2023-11-13 NOTE — Assessment & Plan Note (Signed)
 Stable this AM 2.9 -- > 2.6.  - Phosphorous labs qAM

## 2023-11-13 NOTE — TOC Progression Note (Addendum)
 Transition of Care [facility]) - Progression Note    Patient Details  Name: Patricia White MRN: [id number] Date of Birth: 23-Mar-1971  Transition of Care [facility]) CM/SW Contact  Janae Bridgeman, RN Phone Number: 11/13/2023, 1:58 PM  Clinical Narrative:    CM spoke with CM with CIR and aunt expressed that she would prefer patient to discharge home with home health services when medically stable.  The patient's Cortrak remains in place while diet is being advanced as tolerated.  Palliative Care is continuing to follow the patient.  11/13/23 1506 - I spoke with the patient's aunt at the bedside and confirmed that she would prefer that patient go home with home health services once Cortrak has been removed.  PT was updated that the patient's family would prefer [facility] and has declined CIR admission.   Expected Discharge Plan: Home w Home Health Services Barriers to Discharge: Continued Medical Work up  Expected Discharge Plan and Services     Post Acute Care Choice: Home Health Living arrangements for the past 2 months: Single Family Home                                       Social Determinants of Health (SDOH) Interventions SDOH Screenings   Food Insecurity: No Food Insecurity (11/01/2023)  Housing: Low Risk  (11/01/2023)  Transportation Needs: No Transportation Needs (11/01/2023)  Utilities: Not At Risk (11/01/2023)  Alcohol Screen: Low Risk  (03/24/2023)  Depression (PHQ2-9): Low Risk  (08/09/2023)  Financial Resource Strain: Low Risk  (03/24/2023)  Physical Activity: Insufficiently Active (03/24/2023)  Social Connections: Moderately Isolated (03/24/2023)  Stress: No Stress Concern Present (03/24/2023)  Tobacco Use: Low Risk  (11/08/2023)  Health Literacy: Adequate Health Literacy (03/24/2023)    Readmission Risk Interventions     No data to display

## 2023-11-13 NOTE — Progress Notes (Signed)
SCD placed per order. 

## 2023-11-13 NOTE — Progress Notes (Signed)
 Daily Progress Note Intern Pager: [phone]  Patient name: Patricia White Medical record number: [id number] Date of birth: Jun 15, 1971 Age: 53 y.o. Gender: female  Primary Care Provider: Celine Mans, MD Consultants: GI (s/o), ID, palliative  Code Status: DNR, limited  Pt Overview and Major Events to Date:   Patricia White is a 53 year old female with history of cerebral palsy, seizures, HTN, initially admitted for AKI and abdominal pain.  Currently with failure to thrive, EGD with gastritis.  Cortrak was placed and patient is now receiving feeds.  She is tolerating p.o. and appropriate to restart p.o. medications.   3/20 - Admitted, nystatin for oral thrush 3/24 - Rapid response called due to choking on medication 3/25 - Diflucan IV started for oral thrush 3/26 - PICC placed due to difficulty with IV placements 3/27 - EGD showing gastritis 3/28 - Cortrak placed Assessment & Plan FTT (failure to thrive) in adult Likely 2/2 infectious gastroenteritis --> gastritis. Cortrak 3/28. Began tolerating PO medications yesterday. Per family, increasing amount of spoonfuls eaten.  - Continue GI recs: pantoprazole 40 mg, Carafate 1g TID - RD following, Cortrak recs as below:  -Osmolite 1.5 KCal for total 1080 mL/day + Prosource TF20 60 mL daily = 1700 Kcal daily, 87 gm protein, 822 mL free water -100 mg thiamine, MVI, magic cup TID -Ensure Enlive PO BID - Appreciate palliative care recommendations - Appreciate PT/OT recommendations - CBG checks q4h - Elevated head of bed >30 degrees with feeds - AM CBC, RFP Pancytopenia (HCC) Platelets improved 97 --> 119. WBC/RBC/Hgb continues downtrending.  - Ordered CBC w/diff and reticulocyte count - Iron/TIBC, ferritin and B12 WNL. - Continue SCDs  Hypomagnesemia WNL this AM 1.9 --> 2.1  - Magnesium labs qAM  Hypophosphatemia Stable this AM 2.9 -- > 2.6.  - Phosphorous labs qAM  Chronic and Stable Issues:  GERD - As above Seizures  -patient can tolerate p.o. and will restart home regimen Tegretol 300 mg BID, Topamax 75 mg BID; discontinue IV Depacon and Keppra. HTN - Holding home BP meds HLD - Holding home Pravastatin 40mg , unable to take PO Constipation - Miralax daily, senna daily. Monitoring daily. Oral thrush-present on admission and treated with fluconazole x 5 days (3/25 - 3/30); now resolved.  FEN/GI: Regular diet, cortrak with above nutrition PPx: SCDs Dispo: Pending PT recs. Family wants to DC home now onc emedically stable.   Subjective:   On interview, patient sitting up comfortably in bed.  She says that it is her birthday soon and that she is excited.  No new concerns from patient or aunt.  Objective:  BP: 93/55 HR: 102 RR: 16 T: 97.6 O2sat: 100% on RA  Significant vitals over past 24 hours:   Physical Exam:  General: Sitting up in bed, no acute distress, cord track in place Cardiovascular: RRR no m/r/g. Respiratory: CTAB. No w/r/r.  Abdomen: No tenderness in 4 quadrants. BS+.  Extremities: Limited L sided movement, baseline.   Basic labs:  Most recent CBC Lab Results  Component Value Date   WBC 3.8 (L) 11/13/2023   HGB 7.6 (L) 11/13/2023   HCT 24.5 (L) 11/13/2023   MCV 98.4 11/13/2023   PLT 119 (L) 11/13/2023   Most recent BMP    Latest Ref Rng & Units 11/13/2023    3:48 AM  BMP  Glucose 70 - 99 mg/dL 010   BUN 6 - 20 mg/dL 15   Creatinine 2.72 - 1.00 mg/dL 5.36   Sodium 644 - 034 mmol/L  133   Potassium 3.5 - 5.1 mmol/L 4.8   Chloride 98 - 111 mmol/L 103   CO2 22 - 32 mmol/L 24   Calcium 8.9 - 10.3 mg/dL 8.2     Other pertinent labs:  B12 883 WNL Iron/TIBC WNL Ferritin WNL   Imaging/Diagnostic Tests:  No new imaging.   Tomie China, MD 11/13/2023, 8:20 AM  PGY-1, [facility] Family Medicine FPTS Intern pager: [phone], text pages welcome Secure chat group [facility] Teaching Service

## 2023-11-14 DIAGNOSIS — R627 Adult failure to thrive: Secondary | ICD-10-CM | POA: Diagnosis not present

## 2023-11-14 DIAGNOSIS — Z515 Encounter for palliative care: Secondary | ICD-10-CM | POA: Diagnosis not present

## 2023-11-14 DIAGNOSIS — Z7189 Other specified counseling: Secondary | ICD-10-CM | POA: Diagnosis not present

## 2023-11-14 LAB — TECHNOLOGIST SMEAR REVIEW: Plt Morphology: NORMAL

## 2023-11-14 LAB — CBC WITH DIFFERENTIAL/PLATELET
Abs Immature Granulocytes: 0.04 10*3/uL (ref 0.00–0.07)
Basophils Absolute: 0 10*3/uL (ref 0.0–0.1)
Basophils Relative: 1 %
Eosinophils Absolute: 0.2 10*3/uL (ref 0.0–0.5)
Eosinophils Relative: 5 %
HCT: 23.8 % — ABNORMAL LOW (ref 36.0–46.0)
Hemoglobin: 7.5 g/dL — ABNORMAL LOW (ref 12.0–15.0)
Immature Granulocytes: 1 %
Lymphocytes Relative: 35 %
Lymphs Abs: 1.4 10*3/uL (ref 0.7–4.0)
MCH: 31 pg (ref 26.0–34.0)
MCHC: 31.5 g/dL (ref 30.0–36.0)
MCV: 98.3 fL (ref 80.0–100.0)
Monocytes Absolute: 0.6 10*3/uL (ref 0.1–1.0)
Monocytes Relative: 13 %
Neutro Abs: 1.9 10*3/uL (ref 1.7–7.7)
Neutrophils Relative %: 45 %
Platelets: 137 10*3/uL — ABNORMAL LOW (ref 150–400)
RBC: 2.42 MIL/uL — ABNORMAL LOW (ref 3.87–5.11)
RDW: 13.7 % (ref 11.5–15.5)
WBC: 4.2 10*3/uL (ref 4.0–10.5)
nRBC: 0 % (ref 0.0–0.2)

## 2023-11-14 LAB — RETICULOCYTES
Immature Retic Fract: 30.2 % — ABNORMAL HIGH (ref 2.3–15.9)
RBC.: 2.45 MIL/uL — ABNORMAL LOW (ref 3.87–5.11)
Retic Count, Absolute: 74.2 10*3/uL (ref 19.0–186.0)
Retic Ct Pct: 3 % (ref 0.4–3.1)

## 2023-11-14 LAB — RENAL FUNCTION PANEL
Albumin: 2.4 g/dL — ABNORMAL LOW (ref 3.5–5.0)
Anion gap: 6 (ref 5–15)
BUN: 15 mg/dL (ref 6–20)
CO2: 26 mmol/L (ref 22–32)
Calcium: 8.5 mg/dL — ABNORMAL LOW (ref 8.9–10.3)
Chloride: 103 mmol/L (ref 98–111)
Creatinine, Ser: 0.87 mg/dL (ref 0.44–1.00)
GFR, Estimated: >60 mL/min (ref >60)
Glucose, Bld: 119 mg/dL — ABNORMAL HIGH (ref 70–99)
Phosphorus: 3.2 mg/dL (ref 2.5–4.6)
Potassium: 4.4 mmol/L (ref 3.5–5.1)
Sodium: 135 mmol/L (ref 135–145)

## 2023-11-14 LAB — GLUCOSE, CAPILLARY
Glucose-Capillary: 118 mg/dL — ABNORMAL HIGH (ref 70–99)
Glucose-Capillary: 103 mg/dL — ABNORMAL HIGH (ref 70–99)
Glucose-Capillary: 139 mg/dL — ABNORMAL HIGH (ref 70–99)
Glucose-Capillary: 99 mg/dL (ref 70–99)

## 2023-11-14 LAB — MAGNESIUM: Magnesium: 1.9 mg/dL (ref 1.7–2.4)

## 2023-11-14 MED ORDER — BOOST / RESOURCE BREEZE PO LIQD CUSTOM
1.0000 | Freq: Three times a day (TID) | ORAL | Status: DC
  Administered 2023-11-14 – 2023-11-15 (×2): 1 via ORAL

## 2023-11-14 MED ORDER — NYSTATIN 100000 UNIT/ML MT SUSP
5.0000 mL | Freq: Four times a day (QID) | OROMUCOSAL | Status: DC
  Administered 2023-11-14 – 2023-11-16 (×11): 500000 [IU] via ORAL
  Filled 2023-11-14 (×11): qty 5

## 2023-11-14 MED ORDER — BENZOCAINE 20 % MT AERO
INHALATION_SPRAY | Freq: Two times a day (BID) | OROMUCOSAL | Status: DC | PRN
  Filled 2023-11-14: qty 57

## 2023-11-14 MED ORDER — OSMOLITE 1.5 CAL PO LIQD: 1000.0000 mL | ORAL | Status: DC

## 2023-11-14 MED ORDER — OSMOLITE 1.5 CAL PO LIQD
540.0000 mL | ORAL | Status: DC
  Administered 2023-11-14: 540 mL
  Filled 2023-11-14 (×2): qty 711

## 2023-11-14 MED ORDER — DIVALPROEX SODIUM 500 MG PO DR TAB
500.0000 mg | DELAYED_RELEASE_TABLET | Freq: Two times a day (BID) | ORAL | Status: DC
  Administered 2023-11-14 – 2023-11-16 (×5): 500 mg via ORAL
  Filled 2023-11-14 (×5): qty 1

## 2023-11-14 NOTE — Assessment & Plan Note (Addendum)
 Patient PO intake improved today, ate some grits as well as ice cream. Still distressed by NGT, currently on 45 mL/h. Per RD recommendation, will switch to solid PO diet and initiate only nocturnal Cortrak feeds. (Patient requested pizza.)  - Diet per RD recommendations, appreciate recs - Also appreciate palliative care's ongoing care for this patient - Continue GI recs: pantoprazole 40 mg, Carafate 1g TID - Appreciate PT/OT recommendations - CBG checks q4h - Elevated head of bed >30 degrees with feeds - AM CBC, RFP

## 2023-11-14 NOTE — Progress Notes (Addendum)
 Nutrition Follow-up  DOCUMENTATION CODES:   Severe malnutrition in context of acute illness/injury  INTERVENTION:   Transition to nocturnal tube feeding via cortrak: Osmolite 1.5 at 45 ml/hr from 6 pm to 6 am (540 ml per day) Prosource TF20 60 ml daily   Provides 890 kcal, 55 gm protein, 411 ml free water daily    48 hour calorie count  Transition from Ensure Enlive to Boost Breeze po TID due to patients preference, each supplement provides 250 kcal and 9 grams of protein Continue Magic cup TID with meals, each supplement provides 290 kcal and 9 grams of protein MVI with minerals  daily  Monitor PO intake and adjust tube feeds as necessary Discontinue Thiamine   NUTRITION DIAGNOSIS:   Severe Malnutrition related to acute illness as evidenced by mild fat depletion, energy intake < or equal to 50% for > or equal to 5 days, moderate muscle depletion.  - Still applicable   GOAL:   Patient will meet greater than or equal to 90% of their needs  - Meeting via TF's  MONITOR:   PO intake, Supplement acceptance, Diet advancement, Labs, I & O's  REASON FOR ASSESSMENT:   Consult Assessment of nutrition requirement/status  ASSESSMENT:   53 y.o female with PMH of CP, seizures, L hemiparesis, HTN, T2DM, HLD. Presented with decreased appetite, nausea, vomiting, and abdominal pain.  Found to have moderate gastritis, thrush,  and FTT.  3/27- EGD showed gastritis  3/28 - Tube feeds started  4/2 - Nocturnal tube feeds    Patient resting in bed, much more awake compared to prior visits, aunt at bedside. Aunt has ben ordering meals for patient. Aunt reports patient's intake has been improving and she had 6 spoons of grits, 100% of her soda and milk for breakfast. Has been eating 100% of her magic cups on her trays, does not drink the Ensures, willing to try Boost breeze. Patient wanting pizza on visit and has showed signs of increased appetite. Spoke to MD and team about advancing diet,  team in agreement and diet was advanced to Regular. RD placed patients lunch order.   RD will transition to nocturnal tube feeds and conduct a 24-48 hour calorie count to determine if feeding tube can be removed. RD encouraged use of ONS and PO intake.  Admit weight: 75.4 kg  Current weight: 69 kg    Average Meal Intake: 3/30: 5-10% intake x 3 recorded meals  Nutritionally Relevant Medications: Scheduled Meds:  feeding supplement  1 Container Oral TID BM   feeding supplement (OSMOLITE 1.5 CAL)  540 mL Per Tube Q24H   feeding supplement (PROSource TF20)  60 mL Per Tube Daily   multivitamin with minerals  1 tablet Oral Daily   senna-docusate  1 tablet Oral QHS   Labs Reviewed: Calcium 8.5,  CBG ranges from 103-144 mg/dL over the last 24 hours HgbA1c 5 (2023)   Diet Order:   Diet Order             Diet regular Room service appropriate? Yes; Fluid consistency: Thin  Diet effective now                   EDUCATION NEEDS:   Education needs have been addressed  Skin:  Skin Assessment: Reviewed RN Assessment  Last BM:  11/06/23, type 6, large  Height:   Ht Readings from Last 1 Encounters:  11/08/23 5\' 3"  (1.6 m)    Weight:   Wt Readings from Last 1 Encounters:  11/08/23 69 kg    Ideal Body Weight:  52.3 kg  BMI:  Body mass index is 26.95 kg/m.  Estimated Nutritional Needs:   Kcal:  1700-1900 kcal  Protein:  90-110 gm  Fluid:  >1.7L/day   Elliot Dally, RD Registered Dietitian  See Amion for more information

## 2023-11-14 NOTE — Assessment & Plan Note (Addendum)
 Improved this AM, 2.0 --> 3.2. - Daily AM Phosphorus - replete as indicated

## 2023-11-14 NOTE — Plan of Care (Signed)

## 2023-11-14 NOTE — Assessment & Plan Note (Addendum)
 GERD - As above Seizures -patient can tolerate p.o. and will restart home regimen Tegretol 300 mg BID, Topamax 75 mg BID; discontinue IV Depacon and Keppra. HTN - Holding home BP meds HLD - Holding home Pravastatin 40mg , unable to take PO Constipation - Miralax daily, senna daily. Monitoring daily.

## 2023-11-14 NOTE — Assessment & Plan Note (Signed)
 Continue home Depakote, Tegretol.

## 2023-11-14 NOTE — Progress Notes (Signed)
 Daily Progress Note Intern Pager: [phone]  Patient name: Patricia White Medical record number: [id number] Date of birth: 1970/11/08 Age: 53 y.o. Gender: female  Primary Care Provider: Celine Mans, MD Consultants: Palliative, RD, GI  Code Status: DNR, Limited  Pt Overview and Major Events to Date:   Patricia White is a 53 year old female with history of cerebral palsy, seizures, HTN, initially admitted for AKI and abdominal pain.  Currently with failure to thrive, EGD with gastritis.  Cortrak was placed and patient is now receiving feeds.  She is tolerating p.o., starting solids today.    3/20 - Admitted, nystatin for oral thrush 3/24 - Rapid response called due to choking on medication 3/25 - Diflucan IV started for oral thrush 3/26 - PICC placed due to difficulty with IV placements 3/27 - EGD showing gastritis 3/28 - Cortrak placed Assessment & Plan FTT (failure to thrive) in adult Patient PO intake improved today, ate some grits as well as ice cream. Still distressed by NGT, currently on 45 mL/h. Per RD recommendation, will switch to solid PO diet and initiate only nocturnal Cortrak feeds. (Patient requested pizza.)  - Diet per RD recommendations, appreciate recs - Also appreciate palliative care's ongoing care for this patient - Continue GI recs: pantoprazole 40 mg, Carafate 1g TID - Appreciate PT/OT recommendations - CBG checks q4h - Elevated head of bed >30 degrees with feeds - AM CBC, RFP Pancytopenia (HCC) Hemoglobin stable 7.6 --> 7.4. WBC improved, now WNL. Reticulocyte count within normal limits 3.0. Unremarkable blood smear. While valproate/tegretol can induce pancytopenia, this is a diagnosis of exclusion.  - Continue to monitor inpatient - Outpatient neurology may need to consider medication dose adjustment/change Oral thrush Improved, will d/c fluconazole given 5 day course (3/25-3/30), briefly resolved and now returned.  - Start oral Nystatin rinse Seizure  disorder (HCC) Continue home Depakote, Tegretol.  Hypomagnesemia Stable this AM 2.1 --> 1.9.  - Daily AM Mag - replete as indicated Hypophosphatemia Improved this AM, 2.0 --> 3.2. - Daily AM Phosphorus - replete as indicated Chronic health problem GERD - As above Seizures -patient can tolerate p.o. and will restart home regimen Tegretol 300 mg BID, Topamax 75 mg BID; discontinue IV Depacon and Keppra. HTN - Holding home BP meds HLD - Holding home Pravastatin 40mg , unable to take PO Constipation - Miralax daily, senna daily. Monitoring daily.   FEN/GI: starting solid PO diet with nocturnal Cortrak feeds, see RD note PPx: SCDs Dispo: Likely home with home health  Subjective:   On interview, patient is cheerful and more active. Per aunt, she is eating more of her meals by mouth.    Objective:  BP: 112/75 HR: 111 RR: 16 T: 98.6 O2sat: 100% on RA  Significant vitals over past 24 hours:   Physical Exam:  General: Ill appearing female, no acute distress, sitting up in bed Cardiovascular: RRR no m/r/g. Respiratory: CTAB. No w/r/r.  Abdomen: No tenderness in 4 quadrants. BS+.  Extremities: ROM intact, baseline left sided deficits Skin: No sores noted on back and heel  Basic labs:  Most recent CBC Lab Results  Component Value Date   WBC 4.2 11/14/2023   HGB 7.5 (L) 11/14/2023   HCT 23.8 (L) 11/14/2023   MCV 98.3 11/14/2023   PLT 137 (L) 11/14/2023   Most recent BMP    Latest Ref Rng & Units 11/14/2023    4:17 AM  BMP  Glucose 70 - 99 mg/dL 147   BUN 6 - 20 mg/dL 15  Creatinine 0.44 - 1.00 mg/dL 5.36   Sodium 644 - 034 mmol/L 135   Potassium 3.5 - 5.1 mmol/L 4.4   Chloride 98 - 111 mmol/L 103   CO2 22 - 32 mmol/L 26   Calcium 8.9 - 10.3 mg/dL 8.5     Other pertinent labs:  BGs: 118 --> 103 Peripheral blood smear: awaiting Phosphorus 2.0 --> 3.2 Albumin 2.4 --> 2.4 Retic count  Imaging/Diagnostic Tests:  No new imaging  Tomie China, MD 11/14/2023,  8:34 AM  PGY-1, [facility] FPTS Intern pager: [phone], text pages welcome Secure chat group [facility] Teaching Service

## 2023-11-14 NOTE — Assessment & Plan Note (Addendum)
 Hemoglobin stable 7.6 --> 7.4. WBC improved, now WNL. Reticulocyte count within normal limits 3.0. Unremarkable blood smear. While valproate/tegretol can induce pancytopenia, this is a diagnosis of exclusion.  - Continue to monitor inpatient - Outpatient neurology may need to consider medication dose adjustment/change

## 2023-11-14 NOTE — Progress Notes (Signed)
 Physical Therapy Treatment Patient Details Name: Patricia White MRN: [id number] DOB: [birth year] Today's Date: 11/14/2023   History of Present Illness 53 y/o female presents to [facility] on 11/01/23 for hypotension and dehydration in setting of 3 days of cough, nausea and vomiting. Found to have AKI, hypokalemia, moderate gastritis, thrush, and FTT. Poor PO intake so cortrak placed and tube feeds started 3/28. PMH: Infantile CP with chronic L hemiparesis, cared for by aunt, seizure disorder, moderate ID, HTN, seasonal allergies, T2DM, HLD, obesity.    PT Comments  Pt received in supine, more alert today and motivated by desire to eat lunch, agreeable to get OOB to chair as tray arrived during session. Pt grimacing during transfer training and gesturing to her R forearm, difficult to tell severity of pain due to developmental disability and pt dysarthric speech. Pt has difficulty managing L PF RW on her own however is able to perform step pivot transfer with +1 assist using PFRW to chair today which is an improvement compared with previous PT session. Pt not able to perform typical household distance gait trial yet and will need to ascend 4 STE home without rail, may need to consider wheelchair DME for home pending progress. Pt family now plans on DC home after medically cleared so recommend HHPT, discussed with supervising PT Zain B, DME recommendation pending pt progress with functional mobility tasks.   If plan is discharge home, recommend the following: Assistance with cooking/housework;Assistance with feeding;Direct supervision/assist for medications management;Assist for transportation;Help with stairs or ramp for entrance;Supervision due to cognitive status;Two people to help with walking and/or transfers;A lot of help with bathing/dressing/bathroom;Direct supervision/assist for financial management   Can travel by private vehicle        Equipment Recommendations  None recommended by PT;Other (comment)  (TBD pending progress, may need wheelchair vs L PFRW)    Recommendations for Other Services       Precautions / Restrictions Precautions Precautions: Fall Recall of Precautions/Restrictions: Impaired Precaution/Restrictions Comments: had a fall the night of 3/20 per AM nurse Required Braces or Orthoses: Other Brace Other Brace: LLE AFO (in room and sneakers) Restrictions Weight Bearing Restrictions Per Provider Order: No     Mobility  Bed Mobility Overal bed mobility: Needs Assistance Bed Mobility: Supine to Sit     Supine to sit: HOB elevated, Min assist     General bed mobility comments: Increased time/effort to perform; cues for sequencing/safety.    Transfers Overall transfer level: Needs assistance Equipment used: Left platform walker Transfers: Sit to/from Stand, Bed to chair/wheelchair/BSC Sit to Stand: Min assist   Step pivot transfers: Mod assist       General transfer comment: pt stands well from EOB with only minA, but heavy reliance on L platform RW while pivoting toward chair on her L side and dense cues for BLE placement/safety and sequencing and assistive device management. Pt impulsive to sit when approaching chair    Ambulation/Gait Ambulation/Gait assistance: Mod assist Gait Distance (Feet): 5 Feet Assistive device: Left platform walker Gait Pattern/deviations: Step-to pattern, Decreased dorsiflexion - left, Decreased dorsiflexion - right, Decreased stride length, Leaning posteriorly, Shuffle, Trunk flexed       General Gait Details: quick to fatigue and posterior lean after ~74ft, pt cued to reach back when sitting but sits before reaching; pt needing max to totalA for L PF RW management as it is unfamiliar device for her and pt unable to indicare if her difficulty following cues for AD mgmt is due to dizziness/fatigue  or other reason. Pt grimacing and nods yes when asked if L platform is uncomfortable but does not elaborate on where/why. Per family  her bathroom is <45ft from her bed at home.   Stairs             Wheelchair Mobility     Tilt Bed    Modified Rankin (Stroke Patients Only)       Balance Overall balance assessment: Needs assistance Sitting-balance support: Feet supported, No upper extremity supported Sitting balance-Leahy Scale: Poor Sitting balance - Comments: Occasional anterior lean, needing CGA to minA to stabilize.   Standing balance support: Reliant on assistive device for balance, Bilateral upper extremity supported, During functional activity Standing balance-Leahy Scale: Poor Standing balance comment: heavy +1 assist for dynamic standing tasks, pt standing <2 minutes total.                            Communication Communication Communication: Impaired Factors Affecting Communication: Reduced clarity of speech;Difficulty expressing self  Cognition Arousal: Alert Behavior During Therapy: Flat affect, Impulsive   PT - Cognitive impairments: Difficult to assess, Initiation, Sequencing, Problem solving, Safety/Judgement, Attention Difficult to assess due to: Impaired communication                     PT - Cognition Comments: Development disability at baseline, Aunt who is one of her caretakers present throughout. Pt has difficulty expressing her needs and mildly impulsive with cognitive deficit which increases her fall risk due to overall poor safety awareness and insight into deficits. Following commands: Impaired Following commands impaired: Follows one step commands with increased time    Cueing Cueing Techniques: Verbal cues, Gestural cues, Tactile cues, Visual cues  Exercises      General Comments General comments (skin integrity, edema, etc.): Pt received in bed saturated with urine, had not notified staff until PTA assisting her to EOB and pt not safe to leave alone sitting EOB so once pt up in chair, pt gown changed for dry gown and thighs/lower legs wiped with  soap/water, RN notified; pt assisted to clean her hands with antibacterial wipe prior to eating. Family present to help her eat while pt sitting up in chair, and agree to press call bell for pt prior to her getting up since chair alarm was not in seat (PTA did not see it on floor behind chair until pt sitting in chair already) pt aunt will stay wtih her while pt up in chair, RN also notified. Some irritation and swelling on R forearm where pt c/o pain, RN notified.      Pertinent Vitals/Pain Pain Assessment Pain Assessment: Faces Faces Pain Scale: Hurts little more Breathing: normal Negative Vocalization: occasional moan/groan, low speech, negative/disapproving quality Facial Expression: facial grimacing Body Language: relaxed Consolability: distracted or reassured by voice/touch PAINAD Score: 4 Pain Location: R forearm, LUE when using PF RW Pain Descriptors / Indicators: Guarding, Grimacing Pain Intervention(s): Limited activity within patient's tolerance, Monitored during session, Repositioned    Home Living                          Prior Function            PT Goals (current goals can now be found in the care plan section) Acute Rehab PT Goals Patient Stated Goal: To go home (per pt and family do not want to pursue CIR at this time) PT Goal  Formulation: With patient/family Time For Goal Achievement: 11/17/23 Progress towards PT goals: Progressing toward goals    Frequency    Min 3X/week      PT Plan      Co-evaluation              AM-PAC PT "6 Clicks" Mobility   Outcome Measure  Help needed turning from your back to your side while in a flat bed without using bedrails?: A Little Help needed moving from lying on your back to sitting on the side of a flat bed without using bedrails?: A Lot (flat bed/no rail) Help needed moving to and from a bed to a chair (including a wheelchair)?: A Lot Help needed standing up from a chair using your arms (e.g.,  wheelchair or bedside chair)?: A Lot Help needed to walk in hospital room?: Total Help needed climbing 3-5 steps with a railing? : Total 6 Click Score: 11    End of Session Equipment Utilized During Treatment: Gait belt;Other (comment) (pt defers LLE AFO due to wet gown and wanting to clean up and only pivoting to chair) Activity Tolerance: Patient tolerated treatment well;Patient limited by fatigue;Other (comment) (c/o more arm pain today (R forearm)) Patient left: in chair;with call bell/phone within reach;with family/visitor present;Other (comment) (aunt [name] in room with her, pillows to wedge under L hip/support LUE and promote neutral posture) Nurse Communication: Mobility status;Need for lift equipment;Other (comment) (safer with Stedy for transfers, family state they can help as well, pt RUE pain) PT Visit Diagnosis: Other abnormalities of gait and mobility (R26.89)     Time: 1610-9604 PT Time Calculation (min) (ACUTE ONLY): 36 min  Charges:    $Therapeutic Activity: 23-37 mins PT General Charges $$ ACUTE PT VISIT: 1 Visit                     Patricia Zion P., PTA Acute Rehabilitation Services Secure Chat Preferred 9a-5:30pm Office: [phone]    Dorathy Kinsman [facility] 11/14/2023, 1:39 PM

## 2023-11-14 NOTE — Progress Notes (Signed)
 Mobility Specialist: Progress Note   11/14/23 1614  Mobility  Activity Transferred from chair to bed  Level of Assistance Moderate assist, patient does 50-74%  Assistive Device Other (Comment) (HHA)  Activity Response Tolerated well  Mobility Referral Yes  Mobility visit 1 Mobility  Mobility Specialist Start Time (ACUTE ONLY) 1405  Mobility Specialist Stop Time (ACUTE ONLY) 1419  Mobility Specialist Time Calculation (min) (ACUTE ONLY) 14 min    Pt received in chair agreeable to return to bed. MinA for STS, modA for stand pivot to bed. Heavy anterior lean upon standing. No complaints. CG for bed mobility. Left in bed with all needs met, call bell in reach. Aunt in room.   Maurene Capes Mobility Specialist Please contact via SecureChat or Rehab office at [phone]

## 2023-11-14 NOTE — Assessment & Plan Note (Signed)
 Stable this AM 2.1 --> 1.9.  - Daily AM Mag - replete as indicated

## 2023-11-14 NOTE — Assessment & Plan Note (Signed)
 Improved, will d/c fluconazole given 5 day course (3/25-3/30), briefly resolved and now returned.  - Start oral Nystatin rinse

## 2023-11-14 NOTE — Progress Notes (Signed)
 Palliative Medicine Inpatient Follow Up Note  HPI: 53 year old female with past medical history cerebral palsy, GERD, seizures, hypertension, hyperlipidemia admitted for AKI, also with decreased appetite, nausea, vomiting, abdominal pain.    Palliative care has been asked to support additional goals of care conversations.    Today's Discussion 11/14/2023  *Please note that this is a verbal dictation therefore any spelling or grammatical errors are due to the "Dragon Medical One" system interpretation.  Chart reviewed inclusive of vital signs, progress notes, laboratory results, and diagnostic images.   I spoke with patients RN, Lorin Picket this morning. We discussed changing her from the lidocaine orall to hurricane spray.   I met with [name] at bedside this morning. She is very responsive to me and making good attempts to speak with me. She is noted to have ongoing through pain and slight abdominal pain on deep palpation though otherwise not in any distress.   I spoke to [place] further. She keeps pointing to the coretrack and is hopeful this can be removed at some point. I spoke with [name] and her aunt, [name]. I shared in order for the coretrack to come out [name] has to show that she is able to eat sufficiently.   I did speak with she and her aunt for long term tube feeding. [name] did not seem to comprehend the entirety of the situation though it appeared generally that she wants to eat and drink on her own. Her aunt shares that from discussions with the family it would likely not be something they would want to pursue.   The hope at this time is that [name] will be able to get adequate oral nutrition and transition home.   Coordination with the dietary team to re-evaluate [name] and see perhaps if they could decrease daytime feeding to see if this may enhance her overall PO's.  Questions and concerns addressed/Palliative Support Provided.   Objective Assessment: Vital Signs Vitals:    11/14/23 0720 11/14/23 1143  BP: 125/65 112/75  Pulse: (!) 102 (!) 111  Resp: 16 16  Temp: 98.4 F (36.9 C) 98.6 F (37 C)  SpO2: 100% 100%    Intake/Output Summary (Last 24 hours) at 11/14/2023 1250 Last data filed at 11/14/2023 0600 Gross per 24 hour  Intake 1693.5 ml  Output --  Net 1693.5 ml   Last Weight  Most recent update: 11/08/2023  3:27 PM    Weight  69 kg (152 lb 1.9 oz)            Gen: Elderly middle aged AA F chronically ill appearing HEENT: Dry mucous membranes, poor dentition CV: Regular rate and rhythm  PULM: On RA, breathing even and non-labored ABD: (+) Abdominal tenderness EXT: No edema  Neuro: Alert to self  SUMMARY OF RECOMMENDATIONS   DNAR/DNI   Allowing time for outcomes  Throat Pain: Stop Viscous lidocaine  Add Benzocaine spray Unclear at this point if it is related to the coretrack   Adult FTT: Presently has coretrack Appreciate Nutrition involvement Slow improvements  --> diet is being liberalized, (+) calorie count Family generally would lean against G-tube placement at this time   Patients family would like to take home once medically optimized  Ongoing PMT support  Billing based on MDM: High ______________________________________________________________________________________ Lamarr Lulas [place] Palliative Medicine Team Team Cell Phone: [phone] Please utilize secure chat with additional questions, if there is no response within 30 minutes please call the above phone number  Palliative Medicine Team providers are available  by phone from 7am to 7pm daily and can be reached through the team cell phone.  Should this patient require assistance outside of these hours, please call the patient's attending physician.

## 2023-11-14 NOTE — Plan of Care (Signed)
°  Problem: Clinical Measurements: Goal: Will remain free from infection Outcome: Progressing   Problem: Nutrition: Goal: Adequate nutrition will be maintained Outcome: Progressing   Problem: Coping: Goal: Level of anxiety will decrease Outcome: Progressing

## 2023-11-15 ENCOUNTER — Encounter (HOSPITAL_COMMUNITY): Payer: Self-pay | Admitting: Family Medicine

## 2023-11-15 DIAGNOSIS — Z515 Encounter for palliative care: Secondary | ICD-10-CM | POA: Diagnosis not present

## 2023-11-15 DIAGNOSIS — Z7189 Other specified counseling: Secondary | ICD-10-CM | POA: Diagnosis not present

## 2023-11-15 DIAGNOSIS — R627 Adult failure to thrive: Secondary | ICD-10-CM | POA: Diagnosis not present

## 2023-11-15 DIAGNOSIS — Z66 Do not resuscitate: Secondary | ICD-10-CM | POA: Diagnosis not present

## 2023-11-15 LAB — CBC
HCT: 23.9 % — ABNORMAL LOW (ref 36.0–46.0)
Hemoglobin: 7.5 g/dL — ABNORMAL LOW (ref 12.0–15.0)
MCH: 30.6 pg (ref 26.0–34.0)
MCHC: 31.4 g/dL (ref 30.0–36.0)
MCV: 97.6 fL (ref 80.0–100.0)
Platelets: 162 10*3/uL (ref 150–400)
RBC: 2.45 MIL/uL — ABNORMAL LOW (ref 3.87–5.11)
RDW: 13.9 % (ref 11.5–15.5)
WBC: 3.7 10*3/uL — ABNORMAL LOW (ref 4.0–10.5)
nRBC: 0 % (ref 0.0–0.2)

## 2023-11-15 LAB — RENAL FUNCTION PANEL
Albumin: 2.4 g/dL — ABNORMAL LOW (ref 3.5–5.0)
Anion gap: 8 (ref 5–15)
BUN: 13 mg/dL (ref 6–20)
CO2: 23 mmol/L (ref 22–32)
Calcium: 8.4 mg/dL — ABNORMAL LOW (ref 8.9–10.3)
Chloride: 102 mmol/L (ref 98–111)
Creatinine, Ser: 0.91 mg/dL (ref 0.44–1.00)
GFR, Estimated: >60 mL/min (ref >60)
Glucose, Bld: 110 mg/dL — ABNORMAL HIGH (ref 70–99)
Phosphorus: 3 mg/dL (ref 2.5–4.6)
Potassium: 4 mmol/L (ref 3.5–5.1)
Sodium: 133 mmol/L — ABNORMAL LOW (ref 135–145)

## 2023-11-15 LAB — GLUCOSE, CAPILLARY
Glucose-Capillary: 110 mg/dL — ABNORMAL HIGH (ref 70–99)
Glucose-Capillary: 138 mg/dL — ABNORMAL HIGH (ref 70–99)
Glucose-Capillary: 79 mg/dL (ref 70–99)

## 2023-11-15 LAB — MAGNESIUM: Magnesium: 1.6 mg/dL — ABNORMAL LOW (ref 1.7–2.4)

## 2023-11-15 MED ORDER — BOOST / RESOURCE BREEZE PO LIQD CUSTOM
1.0000 | Freq: Two times a day (BID) | ORAL | Status: DC
  Administered 2023-11-15: 1 via ORAL

## 2023-11-15 MED ORDER — PROSOURCE PLUS PO LIQD
30.0000 mL | Freq: Two times a day (BID) | ORAL | Status: DC
  Administered 2023-11-15 – 2023-11-16 (×3): 30 mL via ORAL
  Filled 2023-11-15 (×3): qty 30

## 2023-11-15 MED ORDER — MAGNESIUM SULFATE 2 GM/50ML IV SOLN
2.0000 g | Freq: Once | INTRAVENOUS | Status: AC
  Administered 2023-11-15: 2 g via INTRAVENOUS
  Filled 2023-11-15: qty 50

## 2023-11-15 NOTE — Assessment & Plan Note (Signed)
 Stable. Home depakote and tegretol restarted on 4/2, continue these meds.

## 2023-11-15 NOTE — Assessment & Plan Note (Addendum)
 Improved. Completed 5 days fluconazole (ended 3/30) -continue oral nystatin rinse

## 2023-11-15 NOTE — Progress Notes (Signed)
 Daily Progress Note   Patient Name: Patricia White       Date: 11/15/2023 DOB: [birth year]  Age: 53 y.o. MRN#: [id number] Attending Physician: Latrelle Dodrill, MD Primary Care Physician: Celine Mans, MD Admit Date: 11/01/2023  Reason for Consultation/Follow-up: Establishing goals of care  Subjective: Sleeping soundly - aunt at bedside reports awake a lot of the night talking.   Length of Stay: 11  Current Medications: Scheduled Meds:   atorvastatin  40 mg Oral Daily   carbamazepine  300 mg Oral BID   Chlorhexidine Gluconate Cloth  6 each Topical Daily   divalproex  500 mg Oral BID   famotidine  20 mg Oral BID   feeding supplement  1 Container Oral TID BM   feeding supplement (OSMOLITE 1.5 CAL)  540 mL Per Tube Q24H   feeding supplement (PROSource TF20)  60 mL Per Tube Daily   multivitamin with minerals  1 tablet Oral Daily   nystatin  5 mL Oral QID   pantoprazole  40 mg Oral Daily   polyethylene glycol  17 g Oral Daily   senna-docusate  1 tablet Oral QHS   sodium chloride flush  10-40 mL Intracatheter Q12H   sucralfate  1 g Oral TID WC & HS   topiramate  75 mg Oral BID    Continuous Infusions:   PRN Meds: acetaminophen **OR** acetaminophen, Benzocaine, dextrose, sodium chloride flush  Physical Exam Constitutional:      General: She is not in acute distress.    Appearance: She is ill-appearing.     Comments: Sleeping soundly  Pulmonary:     Effort: Pulmonary effort is normal.  Abdominal:     Comments: Cortrak in place but not currently infusing  Skin:    General: Skin is warm and dry.             Vital Signs: BP 118/80 (BP Location: Right Arm)   Pulse (!) 102   Temp 98.1 F (36.7 C) (Oral)   Resp 18   Ht 5\' 3"  (1.6 m)   Wt 69 kg   SpO2 100%   BMI 26.95 kg/m   SpO2: SpO2: 100 % O2 Device: O2 Device: Room Air O2 Flow Rate:    Intake/output summary:  Intake/Output Summary (Last 24 hours) at 11/15/2023 1058 Last data filed at 11/15/2023 4098 Gross per 24 hour  Intake 894.5 ml  Output 506 ml  Net 388.5 ml   LBM: Last BM Date : 11/13/23 Baseline Weight: Weight: 75.4 kg Most recent weight: Weight: 69 kg       Palliative Assessment/Data:PPS 50%      Patient Active Problem List   Diagnosis Date Noted   Pancytopenia (HCC) 11/11/2023   Hypomagnesemia 11/09/2023   Hypophosphatemia 11/09/2023   Dehydration 11/08/2023   Gastritis and gastroduodenitis 11/08/2023   Gastric polyp 11/08/2023   Nausea without vomiting 11/08/2023   Protein-calorie malnutrition, severe 11/08/2023   Decreased appetite 11/05/2023   Lower urinary tract infectious disease 11/01/2023   Chronic health problem 11/01/2023   Oral thrush 11/01/2023   FTT (failure to thrive) in adult 11/01/2023   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   Hot flashes due to menopause 06/30/2021   Hyperlipidemia associated  with type 2 diabetes mellitus (HCC) 11/24/2020   Diabetes mellitus without complication (HCC) 11/23/2020   Angioedema 12/10/2019   Lesion of mandible 12/10/2019   Seasonal allergies 11/20/2019   Essential hypertension 10/23/2019   Seizure disorder (HCC) 02/23/2016   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    Palliative care has been asked to support additional goals of care conversations.   Assessment: Aunt reports ongoing improvement in patient condition.  Ate well yesterday - this AM she ate all of her  sausage and 1 1/2 pancakes as well as all of her milk and OJ. Aunt reports she walked well yesterday. Aunt remains hopeful for ongoing improvement and removal of cortak soon. We discuss Patricia White, patient's grandmother - aunt reports she has no questions and agrees with plan of care.   Recommendations/Plan: Patient continues to improve; goals are clear PMT will follow peripherally - please reach out with needs Family hopeful to take patient home soon  Code Status: DNR  Care plan was discussed with patient's aunt  Thank you for allowing the Palliative Medicine Team to assist in the care of this patient.   Total Time 25 minutes Prolonged Time Billed  no   Time spent includes: Detailed review of medical records (labs, imaging, vital signs), medically appropriate exam, discussion with treatment team, counseling and educating patient, family and/or staff, documenting clinical information, medication management and coordination of care.     *Please note that this is a verbal dictation therefore any spelling or grammatical errors are due to the "Dragon Medical One" system interpretation.  Gerlean Ren, DNP, [facility] Palliative Medicine Team Team Phone # [phone]  Pager [phone]

## 2023-11-15 NOTE — Progress Notes (Signed)
 Calorie Count Note  Patient sleeping on visit, aunt at bedside. Patient did pretty well with her intake yesterday and this morning. Her aunt explained she did not like what was provided for dinner and only had 10-20%. Patient has a good appetite when she is presented with foods she likes. Discussed with aunt how to order for patient and will place room service with assist. RD took patients lunch order on visit.  Patient does not like Boost Breeze or Ensures, has not had the magic cup because aunt reports she will only eat vanilla. Made sure to change magic cups to vanilla in healthtouch. Aunt amenable to trying prosource and continuing Boost Breeze, will also add Carnation instant breakfast to trays since patient likes milk.   Messaged team about discontinuing cortrak as patient is having increased PO intake and appetite. Team in agreement. RD is confident patient will continue to increase her PO intake if she is provided what's she wants. Encouraged use of ONS and PO intake.   48 hour calorie count ordered.  Diet: Regular  Supplements: Boost Breeze, Magic cup   4/2 Lunch: 100% cookie, cola, 75% cheese pizza, 25% salad (450 kcal, 18 gm protein) Dinner: 20% [place], mashed potatoes, green beans (80 kcal, 5 gm protein) 4/3: Breakfast: 100% sausage, milk, orange juice, 75% pancakes (550 kcal, 17 gm protein) Supplements: 0  Total intake: 1080 kcal (64% of minimum estimated needs)  40 gm protein (44% of minimum estimated needs)  Estimated Nutritional Needs:  Kcal:  1700-1900 kcal Protein:  90-110 gm Fluid:  >1.7L/day  NUTRITION DIAGNOSIS:    Severe Malnutrition related to acute illness as evidenced by mild fat depletion, energy intake < or equal to 50% for > or equal to 5 days, moderate muscle depletion. - Still applicable    GOAL:    Patient will meet greater than or equal to 90% of their needs - Progressing   Encourage PO intake Room service with assist  Discontinue calorie count  and tube feeds due to increasing PO intake  Continue Boost Breeze po BID due to patients preference, each supplement provides 250 kcal and 9 grams of protein: Patient does not like but aunt wanted to keep just in case  Continue Magic cup TID with meals, each supplement provides 290 kcal and 9 grams of protein *Prefers vanilla  Add [employer] Essentials TID, each packet mixed with 8 ounces of 2% milk provides 13 grams of protein and 260 calories. Add Prosource Plus BID, each packet contains 100 kcal and 15 gm protein  MVI with minerals  daily   Elliot Dally, RD Registered Dietitian  See Amion for more information

## 2023-11-15 NOTE — Assessment & Plan Note (Addendum)
 RD increased to daytime PO and nighttime Cortrak feeds  (78mL/hr 6p-6a) on 4/2. Pt has good appetite and is eating well today. GI signed off 3/31 - Diet per RD - Palliative involved, appreciate recs - continue pantoprazole 40mg , carafate 1g TID per GI recs - PT/OT recommending HHPT - continue q4h CBG checks  - elevate HOB >30 degrees with feeds - AM CBC, RFP

## 2023-11-15 NOTE — Assessment & Plan Note (Addendum)
 Hgb stable at 7.5 today. WCC slightly low at 3.7. Unremarkable peripheral smear. Possibly due to antiepileptic meds, though has been taking these for a long time and plt count improved quickly (less likely HIT). - Will continue SCDs for DVT ppx as patient will likely discharge soon and Hgb still on the lower side. Consider restarting LMWH if not discharging soon. - Continue to monitor with daily CBCs

## 2023-11-15 NOTE — Progress Notes (Addendum)
 Physical Therapy Treatment Patient Details Name: Patricia White MRN: [id number] DOB: [birth year] Today's Date: 11/15/2023   History of Present Illness 53 y/o female presents to [facility] on [facility]/01/23 for hypotension and dehydration in setting of 3 days of cough, nausea and vomiting. Found to have AKI, hypokalemia, moderate gastritis, thrush, and FTT. Poor PO intake so cortrak placed and tube feeds started 3/28. PMH: Infantile CP with chronic L hemiparesis, cared for by aunt, seizure disorder, moderate ID, HTN, seasonal allergies, T2DM, HLD, obesity.    PT Comments  Pt received in supine, agreeable to therapy session with encouragement, with good participation as able. Pt c/o dizziness sitting EOB but difficult to assess seated BP due to poor fit of cuff on her calf and likely inaccurate signal with her leg in dependent position. Pt needing up to modA for sidesteps to simulate step pivot transfer and quick to fatigue with either impulsivity to sit or buckling after <1 minute standing each trial. Pt not able to progress to stair negotiation today, clarified DME as insurance not likely to cover both L Platform RW and transport chair. Pt would benefit from ramp for home, otherwise may need PTAR or family to lift her wheelchair up stairs into home if they are capable. Plan to bring handout on this technique next session. Pt continues to benefit from PT services to progress toward functional mobility goals.     If plan is discharge home, recommend the following: Assistance with cooking/housework;Assistance with feeding;Direct supervision/assist for medications management;Assist for transportation;Help with stairs or ramp for entrance;Supervision due to cognitive status;Two people to help with walking and/or transfers;A lot of help with bathing/dressing/bathroom;Direct supervision/assist for financial management   Can travel by private vehicle        Equipment Recommendations  Wheelchair (measurements PT);Wheelchair  cushion (measurements PT) (transport wheelchair; ramp for home; L PF RW also but family prefers transport chair if only able to obtain one item)    Recommendations for Other Services       Precautions / Restrictions Precautions Precautions: Fall Recall of Precautions/Restrictions: Impaired Precaution/Restrictions Comments: had a fall the night of 3/20 per AM nurse Required Braces or Orthoses: Other Brace Other Brace: LLE AFO (in room and sneakers) Restrictions Weight Bearing Restrictions Per Provider Order: No     Mobility  Bed Mobility Overal bed mobility: Needs Assistance Bed Mobility: Supine to Sit, Sit to Supine     Supine to sit: Mod assist, HOB elevated, Used rails Sit to supine: Min assist   General bed mobility comments: encouragement to initiate, Increased time/effort to perform; cues for sequencing/safety, to R EOB    Transfers Overall transfer level: Needs assistance Equipment used: Left platform walker Transfers: Sit to/from Stand, Bed to chair/wheelchair/BSC Sit to Stand: Min assist   Step pivot transfers: Mod assist       General transfer comment: modA for sidesteps toward her R side to [facility] with L PFRW, pt tending to sit impulsively but will stand up again and take more steps when reminded not to sit.    Ambulation/Gait Ambulation/Gait assistance: Mod assist   Assistive device: Left platform walker       Pre-gait activities: quick to fatigue after sidesteps at EOB, impulsive to sit after pivotal steps; unclear if behavioral or due to reported dizziness     Stairs             Wheelchair Mobility     Tilt Bed    Modified Rankin (Stroke Patients Only)  Balance Overall balance assessment: Needs assistance Sitting-balance support: Feet supported, No upper extremity supported Sitting balance-Leahy Scale: Poor Sitting balance - Comments: anterior and L leaning sitting EOB, CGA to minA   Standing balance support: Reliant on  assistive device for balance, Bilateral upper extremity supported, During functional activity Standing balance-Leahy Scale: Poor                              Communication Communication Communication: Impaired Factors Affecting Communication: Reduced clarity of speech;Difficulty expressing self  Cognition Arousal: Alert Behavior During Therapy: Flat affect, Impulsive   PT - Cognitive impairments: Difficult to assess, Initiation, Sequencing, Problem solving, Safety/Judgement, Attention Difficult to assess due to: Impaired communication                     PT - Cognition Comments: Development disability at baseline, Aunt who is one of her caretakers present throughout. Pt has difficulty expressing her needs and mildly impulsive with cognitive deficit which increases her fall risk due to overall poor safety awareness and insight into deficits. Following commands: Impaired Following commands impaired: Follows one step commands with increased time    Cueing Cueing Techniques: Verbal cues, Gestural cues, Tactile cues, Visual cues  Exercises General Exercises - Lower Extremity Long Arc Quad: AROM, Both, Seated, 10 reps, AAROM Hip Flexion/Marching: AROM, Both, Seated, 10 reps Other Exercises Other Exercises: STS from EOB x3 reps    General Comments General comments (skin integrity, edema, etc.): BP 155/118 in RLE sitting EOB (likely inaccurate due to cuff location; pt arm restricted and other arm hemiplegic so not safe to use her arms); BP 131/89 (103) in RLE in supine; HR 105 bpm, SpO2 100% on RA when sensor reading better (hands are cold, warm blanket placed)      Pertinent Vitals/Pain Pain Assessment Pain Assessment: Faces Faces Pain Scale: Hurts little more Pain Location: not localized Pain Descriptors / Indicators: Guarding, Grimacing Pain Intervention(s): Limited activity within patient's tolerance, Monitored during session, Repositioned    Home Living                           Prior Function            PT Goals (current goals can now be found in the care plan section) Acute Rehab PT Goals Patient Stated Goal: To go home (per pt and family do not want to pursue CIR at this time) PT Goal Formulation: With patient/family Time For Goal Achievement: 11/17/23 Progress towards PT goals: Progressing toward goals (slowly)    Frequency    Min 3X/week      PT Plan      Co-evaluation              AM-PAC PT "6 Clicks" Mobility   Outcome Measure  Help needed turning from your back to your side while in a flat bed without using bedrails?: A Little Help needed moving from lying on your back to sitting on the side of a flat bed without using bedrails?: A Lot Help needed moving to and from a bed to a chair (including a wheelchair)?: A Lot Help needed standing up from a chair using your arms (e.g., wheelchair or bedside chair)?: A Lot Help needed to walk in hospital room?: Total Help needed climbing 3-5 steps with a railing? : Total 6 Click Score: 11    End of Session Equipment Utilized During Treatment:  Gait belt Activity Tolerance: Patient tolerated treatment well;Patient limited by fatigue Patient left: in bed;with call bell/phone within reach;with bed alarm set;with family/visitor present;Other (comment) (pt bed in chair posture) Nurse Communication: Mobility status;Need for lift equipment;Other (comment) (BP not reading well, pt dizzy when sitting up) PT Visit Diagnosis: Other abnormalities of gait and mobility (R26.89)     Time: 3086-5784 PT Time Calculation (min) (ACUTE ONLY): 27 min  Charges:    $Therapeutic Exercise: 8-22 mins $Therapeutic Activity: 8-22 mins PT General Charges $$ ACUTE PT VISIT: 1 Visit                     Evren Shankland P., PTA Acute Rehabilitation Services Secure Chat Preferred 9a-5:30pm Office: [phone]    Dorathy Kinsman [facility] 11/15/2023, 6:51 PM

## 2023-11-15 NOTE — Plan of Care (Signed)

## 2023-11-15 NOTE — Assessment & Plan Note (Addendum)
 GERD: as above Seizures: home tegretol 300mg  BID, topamax 75mg  BID  GERD - As above Seizures -continue home Tegretol 300mg  BID and Topamax 75mg  BID HTN - holding home antihypertensives due to normotension HLD - home atorvastatin 40mg  daily Constipation - daily miralax, senna

## 2023-11-15 NOTE — Assessment & Plan Note (Signed)
 1.6 this AM, repleted.  - continue to monitor with AM Mg and replete as needed

## 2023-11-15 NOTE — Progress Notes (Signed)
 Occupational Therapy Treatment Patient Details Name: Patricia White MRN: [id number] DOB: 1970-09-04 Today's Date: 11/15/2023   History of present illness 53 y/o female presents to [facility] on 11/01/23 for hypotension and dehydration in setting of 3 days of cough, nausea and vomiting. Found to have AKI, hypokalemia, moderate gastritis, thrush, and FTT. Poor PO intake so cortrak placed and tube feeds started 3/28. PMH: Infantile CP with chronic L hemiparesis, cared for by aunt, seizure disorder, moderate ID, HTN, seasonal allergies, T2DM, HLD, obesity.   OT comments  Pt making progress with functional goals. Pt's aunt/caregiver present and supportive. Pt required encouragement from therapist and family for OOB activity. Upon arrival pt in bed eating using hands with HOB elevated and pt leaning to L side. Pt required min A with increase time, effort and multimodal cues for technique and sequencing. Pt with anterior leaning requiring CGA/min A for balance/support. Pt participated in grooming and UB ADLs. Noted to have chuck pad soiled with BM once seated EOB and pt stating that she needed to use the bathroom. Min A to transfer pt to [facility], total A with toileting tasks. OT will continue to follow acutely to maximize level of function and safety      If plan is discharge home, recommend the following:  A little help with bathing/dressing/bathroom;Assistance with cooking/housework;Supervision due to cognitive status;Direct supervision/assist for financial management;Direct supervision/assist for medications management   Equipment Recommendations  None recommended by OT    Recommendations for Other Services      Precautions / Restrictions Precautions Precautions: Fall Recall of Precautions/Restrictions: Impaired Precaution/Restrictions Comments: had a fall the night of 3/20 per AM nurse Required Braces or Orthoses: Other Brace Other Brace: LLE AFO (in room and sneakers) Restrictions Weight Bearing  Restrictions Per Provider Order: No       Mobility Bed Mobility Overal bed mobility: Needs Assistance Bed Mobility: Supine to Sit     Supine to sit: HOB elevated, Min assist     General bed mobility comments: encouragement to initiate, Increased time/effort to perform; cues for sequencing/safety, minn A to scoot hips to EOB    Transfers Overall transfer level: Needs assistance Equipment used: Left platform walker Transfers: Sit to/from Stand, Bed to chair/wheelchair/BSC Sit to Stand: Mod assist, Min assist     Step pivot transfers: Min assist     General transfer comment: SPT to [facility] to R side     Balance Overall balance assessment: Needs assistance Sitting-balance support: Feet supported, No upper extremity supported Sitting balance-Leahy Scale: Poor Sitting balance - Comments: anterior and L leaning sitting EOB and BSC, CGA/min A for support   Standing balance support: Reliant on assistive device for balance, Bilateral upper extremity supported, During functional activity Standing balance-Leahy Scale: Poor                             ADL either performed or assessed with clinical judgement   ADL   Eating/Feeding: Set up;Supervision/ safety;Sitting   Grooming: Wash/dry hands;Wash/dry face;Contact guard assist;Sitting                   Statistician: Minimal Market researcher Details (indicate cue type and reason): to R side to [facility] Toileting- Clothing Manipulation and Hygiene: Total assistance;Sitting/lateral lean              Extremity/Trunk Assessment Upper Extremity Assessment Upper Extremity Assessment: Generalized weakness;LUE deficits/detail LUE Deficits / Details: Congential CP, L hemi, L elbow  flexion/ext intact   Lower Extremity Assessment Lower Extremity Assessment: Defer to PT evaluation   Cervical / Trunk Assessment Cervical / Trunk Assessment: Normal    Vision Patient Visual Report: No  change from baseline     Perception     Praxis     Communication Communication Communication: Impaired Factors Affecting Communication: Reduced clarity of speech;Difficulty expressing self   Cognition Arousal: Alert Behavior During Therapy: Flat affect, Impulsive Cognition: History of cognitive impairments             OT - Cognition Comments: 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, required encouragement from family for OOB activity                 Following commands: Impaired Following commands impaired: Follows one step commands with increased time      Cueing   Cueing Techniques: Verbal cues, Gestural cues, Tactile cues, Visual cues  Exercises      Shoulder Instructions       General Comments      Pertinent Vitals/ Pain       Pain Assessment Pain Assessment: No/denies pain Faces Pain Scale: Hurts little more Pain Location: L UE Pain Descriptors / Indicators: Guarding, Grimacing Pain Intervention(s): Monitored during session, Repositioned  Home Living                                          Prior Functioning/Environment              Frequency  Min 2X/week        Progress Toward Goals  OT Goals(current goals can now be found in the care plan section)  Progress towards OT goals: Progressing toward goals     Plan      Co-evaluation                 AM-PAC OT "6 Clicks" Daily Activity     Outcome Measure   Help from another person eating meals?: A Little Help from another person taking care of personal grooming?: A Little Help from another person toileting, which includes using toliet, bedpan, or urinal?: A Lot Help from another person bathing (including washing, rinsing, drying)?: A Little Help from another person to put on and taking off regular upper body clothing?: A Lot Help from another person to put on and taking off regular lower body clothing?: A Little 6 Click Score: 16    End of Session  Equipment Utilized During Treatment: Gait belt;Rolling walker (2 wheels);Other (comment) (BSC)  OT Visit Diagnosis: Unsteadiness on feet (R26.81);History of falling (Z91.81);Muscle weakness (generalized) (M62.81)   Activity Tolerance Patient tolerated treatment well   Patient Left with family/visitor present;with chair alarm set;in chair   Nurse Communication          Time: 1610-9604 OT Time Calculation (min): 19 min  Charges: OT General Charges $OT Visit: 1 Visit OT Treatments $Self Care/Home Management : 8-22 mins    Galen Manila 11/15/2023, 1:38 PM

## 2023-11-15 NOTE — Assessment & Plan Note (Signed)
 Normalized on 4/2, remains WNL today at 3.0. - continue to monitor with daily BMP

## 2023-11-15 NOTE — Progress Notes (Signed)
 Daily Progress Note Intern Pager: [phone]  Patient name: Patricia White Medical record number: [id number] Date of birth: 1971-04-28 Age: 53 y.o. Gender: female  Primary Care Provider: Celine Mans, MD Consultants: RD, palliative, GI (s/o) Code Status: DNR, limited  Pt Overview and Major Events to Date:  3/20: admitted 3/24: rapid response for choking episode 3/26: PICC line placed d/t difficulties with PIV placement 3/27: EGD showed gastritis 3/28: cortrak placed  Assessment and Plan: Patricia White is a 53 year old female with a history of cerebral palsy, seizures, hypertension who was admitted for AKI (now resolved) and failure to thrive secondary to poor p.o. intake for several weeks.  EGD showed gastritis.  Core track discontinued today as patient has had much improved p.o. intake.  Will monitor p.o. intake the rest of the day and plan for possible discharge tomorrow. Assessment & Plan FTT (failure to thrive) in adult RD increased to daytime PO and nighttime Cortrak feeds  (26mL/hr 6p-6a) on 4/2. Pt has good appetite and is eating well today. GI signed off 3/31 - Diet per RD - Palliative involved, appreciate recs - continue pantoprazole 40mg , carafate 1g TID per GI recs - PT/OT recommending HHPT - continue q4h CBG checks  - elevate HOB >30 degrees with feeds - AM CBC, RFP Pancytopenia (HCC) Hgb stable at 7.5 today. WCC slightly low at 3.7. Unremarkable peripheral smear. Possibly due to antiepileptic meds, though has been taking these for a long time and plt count improved quickly (less likely HIT). - Will continue SCDs for DVT ppx as patient will likely discharge soon and Hgb still on the lower side. Consider restarting LMWH if not discharging soon. - Continue to monitor with daily CBCs   Oral thrush Improved. Completed 5 days fluconazole (ended 3/30) -continue oral nystatin rinse  Seizure disorder (HCC) Stable. Home depakote and tegretol restarted on 4/2,  continue these meds.  Hypomagnesemia 1.6 this AM, repleted.  - continue to monitor with AM Mg and replete as needed Hypophosphatemia Normalized on 4/2, remains WNL today at 3.0. - continue to monitor with daily BMP Chronic health problem GERD: as above Seizures: home tegretol 300mg  BID, topamax 75mg  BID  GERD - As above Seizures -continue home Tegretol 300mg  BID and Topamax 75mg  BID HTN - holding home antihypertensives due to normotension HLD - home atorvastatin 40mg  daily Constipation - daily miralax, senna     FEN/GI: as above PPx: SCDs as above Dispo: home with HHPT pending resumption of full PO diet  Subjective:  Hungry, waiting for breakfast to arrive. Endorses abdominal pain, appears to be the same as yesterday per aunt at bedside.  Objective: Temp:  [98 F (36.7 C)-98.6 F (37 C)] 98 F (36.7 C) (04/03 0558) Pulse Rate:  [97-116] 109 (04/03 0558) Resp:  [16-18] 18 (04/03 0558) BP: (112-125)/(65-85) 116/82 (04/03 0558) SpO2:  [100 %] 100 % (04/03 0558) Physical Exam: General: sitting up in bed, pleasant and conversant, in NAD Cardiovascular: RRR, normal S1/S2 Respiratory: CTAB, normal WOB on RA Abdomen: normoactive bowel sounds, soft, mildly tender to palpation in epigastric region Extremities: no edema to BLE  Laboratory: Most recent CBC Lab Results  Component Value Date   WBC 3.7 (L) 11/15/2023   HGB 7.5 (L) 11/15/2023   HCT 23.9 (L) 11/15/2023   MCV 97.6 11/15/2023   PLT 162 11/15/2023   Most recent BMP    Latest Ref Rng & Units 11/15/2023    3:41 AM  BMP  Glucose 70 - 99 mg/dL 962  BUN 6 - 20 mg/dL 13   Creatinine 3.24 - 1.00 mg/dL 4.01   Sodium 027 - 253 mmol/L 133   Potassium 3.5 - 5.1 mmol/L 4.0   Chloride 98 - 111 mmol/L 102   CO2 22 - 32 mmol/L 23   Calcium 8.9 - 10.3 mg/dL 8.4     Other pertinent labs: none  Imaging/Diagnostic Tests: No new imaging or tests Lorayne Bender, MD 11/15/2023, 7:07 AM  PGY-1, [facility] FPTS Intern pager: [phone], text pages welcome Secure chat group [facility] Teaching Service

## 2023-11-16 ENCOUNTER — Other Ambulatory Visit (HOSPITAL_COMMUNITY): Payer: Self-pay

## 2023-11-16 DIAGNOSIS — R627 Adult failure to thrive: Secondary | ICD-10-CM | POA: Diagnosis not present

## 2023-11-16 LAB — RENAL FUNCTION PANEL
Albumin: 2.4 g/dL — ABNORMAL LOW (ref 3.5–5.0)
Anion gap: 8 (ref 5–15)
BUN: 10 mg/dL (ref 6–20)
CO2: 22 mmol/L (ref 22–32)
Calcium: 8.5 mg/dL — ABNORMAL LOW (ref 8.9–10.3)
Chloride: 105 mmol/L (ref 98–111)
Creatinine, Ser: 0.84 mg/dL (ref 0.44–1.00)
GFR, Estimated: >60 mL/min (ref >60)
Glucose, Bld: 92 mg/dL (ref 70–99)
Phosphorus: 4.5 mg/dL (ref 2.5–4.6)
Potassium: 3.8 mmol/L (ref 3.5–5.1)
Sodium: 135 mmol/L (ref 135–145)

## 2023-11-16 LAB — CBC
HCT: 24 % — ABNORMAL LOW (ref 36.0–46.0)
Hemoglobin: 7.5 g/dL — ABNORMAL LOW (ref 12.0–15.0)
MCH: 30.7 pg (ref 26.0–34.0)
MCHC: 31.3 g/dL (ref 30.0–36.0)
MCV: 98.4 fL (ref 80.0–100.0)
Platelets: 204 10*3/uL (ref 150–400)
RBC: 2.44 MIL/uL — ABNORMAL LOW (ref 3.87–5.11)
RDW: 14.5 % (ref 11.5–15.5)
WBC: 4.7 10*3/uL (ref 4.0–10.5)
nRBC: 0 % (ref 0.0–0.2)

## 2023-11-16 LAB — MAGNESIUM: Magnesium: 1.8 mg/dL (ref 1.7–2.4)

## 2023-11-16 LAB — GLUCOSE, CAPILLARY
Glucose-Capillary: 104 mg/dL — ABNORMAL HIGH (ref 70–99)
Glucose-Capillary: 113 mg/dL — ABNORMAL HIGH (ref 70–99)
Glucose-Capillary: 176 mg/dL — ABNORMAL HIGH (ref 70–99)
Glucose-Capillary: 93 mg/dL (ref 70–99)

## 2023-11-16 MED ORDER — SENNOSIDES-DOCUSATE SODIUM 8.6-50 MG PO TABS: 1.0000 | ORAL_TABLET | Freq: Every day | ORAL | Status: DC

## 2023-11-16 MED ORDER — ACETAMINOPHEN 325 MG PO TABS: 650.0000 mg | ORAL_TABLET | Freq: Four times a day (QID) | ORAL | Status: DC | PRN

## 2023-11-16 MED ORDER — POLYETHYLENE GLYCOL 3350 17 G PO PACK
17.0000 g | PACK | Freq: Every day | ORAL | Status: DC
Start: 2023-11-17 — End: 2023-11-20

## 2023-11-16 MED ORDER — PANTOPRAZOLE SODIUM 40 MG PO TBEC
40.0000 mg | DELAYED_RELEASE_TABLET | Freq: Every day | ORAL | 0 refills | Status: DC
  Filled 2023-11-16: qty 30, 30d supply, fill #0

## 2023-11-16 MED ORDER — PROSOURCE PLUS PO LIQD
30.0000 mL | Freq: Two times a day (BID) | ORAL | 2 refills | Status: DC
  Filled 2023-11-16: qty 887, 15d supply, fill #0

## 2023-11-16 MED ORDER — SUCRALFATE 1 GM/10ML PO SUSP
1.0000 g | Freq: Three times a day (TID) | ORAL | 2 refills | Status: DC
  Filled 2023-11-16: qty 414, 11d supply, fill #0

## 2023-11-16 MED ORDER — NYSTATIN 100000 UNIT/ML MT SUSP
5.0000 mL | Freq: Four times a day (QID) | OROMUCOSAL | 0 refills | Status: DC
  Filled 2023-11-16: qty 60, 3d supply, fill #0

## 2023-11-16 NOTE — TOC Transition Note (Signed)
 Transition of Care [facility]) - Discharge Note   Patient Details  Name: Patricia White MRN: [id number] Date of Birth: 1971-07-26  Transition of Care [facility]) CM/SW Contact:  Janae Bridgeman, RN Phone Number: 11/16/2023, 11:48 AM   Clinical Narrative:    CM met with the patient and aunt at the bedside and patient will be discharged home with aunt and family today.  I spoke with the aunt and offered Medicare choice regarding DME company and she did not have a preference.  [employer] was called to deliver a platform walker to the room today.  Taxi voucher was provided to the charge RN to assist with transportation to home.  I spoke with [facility] and MSW was added for the [facility] orders.    HH orders placed - to be co-signed by medicine team.  Patient's aunt was provided with contact to [facility] to get on the waiting list for WC ramp.  No other TOC needs.  Patient will be discharged home with family today by taxi - voucher with CN on unit.   Final next level of care: Home w Home Health Services Barriers to Discharge: Continued Medical Work up   Patient Goals and CMS Choice Patient states their goals for this hospitalization and ongoing recovery are:: Patient is unable to provide goals. CMS Medicare.gov Compare Post Acute Care list provided to:: Patient Represenative (must comment) (Aunt (caregiver) is at the bedside) Choice offered to / list presented to : [facility] POA / Guardian [facility] ownership interest in [facility].provided to:: [facility] POA / Guardian    Discharge Placement                       Discharge Plan and Services Additional resources added to the After Visit Summary for       Post Acute Care Choice: Home Health                               Social Drivers of Health (SDOH) Interventions SDOH Screenings   Food Insecurity: No Food Insecurity (11/01/2023)  Housing: Low Risk  (11/01/2023)  Transportation Needs: No Transportation Needs (11/01/2023)   Utilities: Not At Risk (11/01/2023)  Alcohol Screen: Low Risk  (03/24/2023)  Depression (PHQ2-9): Low Risk  (08/09/2023)  Financial Resource Strain: Low Risk  (03/24/2023)  Physical Activity: Insufficiently Active (03/24/2023)  Social Connections: Moderately Isolated (03/24/2023)  Stress: No Stress Concern Present (03/24/2023)  Tobacco Use: Low Risk  (11/08/2023)  Health Literacy: Adequate Health Literacy (03/24/2023)     Readmission Risk Interventions    11/16/2023   11:48 AM  Readmission Risk Prevention Plan  Transportation Screening Complete  PCP or Specialist Appt within 5-7 Days Complete  Home Care Screening Complete  Medication Review (RN CM) Complete

## 2023-11-16 NOTE — Progress Notes (Signed)
 RUE PICC removed. Site unremarkable. Gauze pressure dressing applied. Pt instructed to remain flat in bed for 30 min. Required frequent reminders. Family member at bedside voiced understanding. Keep dressing dry and intact x 24 hours.

## 2023-11-16 NOTE — Assessment & Plan Note (Signed)
 WNL x 3 days.

## 2023-11-16 NOTE — Assessment & Plan Note (Signed)
 Stable.   --Continue home Depakote and Tegretol

## 2023-11-16 NOTE — Discharge Summary (Signed)
 Family Medicine Teaching [facility] Discharge Summary  Patient name: Patricia White Medical record number: [id number] Date of birth: [birth year] Age: 53 y.o. Gender: female Date of Admission: 11/01/2023  Date of Discharge: 11/16/23 Admitting Physician: Cyndia Skeeters, DO  Primary Care Provider: Celine Mans, MD Consultants: RD, palliative, GI  Indication for Hospitalization: failure to thrive  Discharge Diagnoses/Problem List:  Principal Problem for Admission: failure to thrive Other Problems addressed during stay:  Principal Problem:   FTT (failure to thrive) in adult Active Problems:   Seizure disorder (HCC)   Lower urinary tract infectious disease   Chronic health problem   Oral thrush   Decreased appetite   Dehydration   Gastritis and gastroduodenitis   Gastric polyp   Nausea without vomiting   Protein-calorie malnutrition, severe   Hypomagnesemia   Pancytopenia [facility])    Brief Hospital Course:  Patricia White is a 53 y.o.female with a history of cerebral palsy, GERD, seizures, HTN, HLD who was admitted to the [facility] Medicine Teaching Service at [facility] for acute kidney injury. Her hospital course is detailed below:  Abdominal Pain, Nausea, Vomiting  Failure to Thrive Intermittent diffuse abdominal pain, nausea, vomiting, and decreased appetite for a few weeks prior to admission. CTAP unremarkable. GI consulted on 3/24 due to persistent refusal of food/loss of appetite and abdominal pain along with choking episode while taking pills. A code blue was called during the choking episode on 3/23, no loss of pulse at that time.  Since choking episode, patient went around 14 days without p.o. intake.  GI recommended continuing IVF, Protonix, Zofran PRN, and abdominal US.  Abdominal ultrasound negative. EGD on 3/27 revealed moderate gastritis in the gastric body and gastric antrum, few small shallow erosions (biopsies taken/H.pylori tested), and single sessile polyp that was  removed. Biopsies were negative for H.pylori and showed nonspecific reactive gastropathy. GI recommended high-dose PPI and carafate, felt presentation was likely sequela of prior gastroenteritis. Oral thrush was treated with IV fluconazole for 5 days then oral nystatin rinse.  RD was consulted on 3/27, made recommendations for diet initially, however pt remained with poor PO intake. Refeeding labs were monitored for at least 3 days and electrolytes were repleted as indicated. Cortrak was placed 3/28 without difficulty. Pt began PO diet during the day and nocturnal Cortrak feeds on 4/2.  Cortrak pulled on 4/3 given much improved PO intake. Patient continued to have good appetite and PO intake by the time of discharge.  Acute Kidney Injury Cr 2.2 on admission, thought to be pre-renal secondary to poor PO intake.  Resolved with MIVF.  Hypokalemia Potassium 2.5 on admission. Pt received IVF with KCl initially, then several rounds of potassium supplementation. By time of discharge, potassium was within normal limits.   Urinary Tract Infection U/A obtained in the ED with large leukocytes. She was treated with Ceftriaxone (3/20-3/22). Symptoms resolved after antibiotics were completed.   Goals of Care Palliative was consulted to discuss goals of care. Pt made DNR.   Other chronic conditions were medically managed with home medications and formulary alternatives as necessary (GERD, seizures, HTN, HLD)  PCP Follow-up Recommendations: If in accordance with GOC, patient is due for colonoscopy; was due in 2021 but not obtained per chart review. Determine length of PPI and carafate for gastritis Review nutrition to ensure adequate caloric/protein intake Review need for social work services Review need to restart BP meds  Disposition: home with [facility] services  Discharge Condition: stable  Discharge Exam:  Vitals:  11/16/23 0057 11/16/23 0809  BP: 116/81 (!) 153/92  Pulse: (!) 101 (!) 108  Resp: 16    Temp: 98.6 F (37 C) 98.1 F (36.7 C)  SpO2: 100% 98%   General: Sitting up in bed eating breakfast, awake and alert, in no acute distress Cardiovascular: RRR, normal S1/S2, no murmurs, rubs, gallops Respiratory: CTAB, normal work of breathing on room air Abdomen: Normoactive bowel sounds, soft, nontender, nondistended Extremities: No edema to BLE  Significant Procedures: EGD, Cortrak placement and removal  Significant Labs and Imaging:  Recent Labs  Lab 11/15/23 0341 11/16/23 0346  WBC 3.7* 4.7  HGB 7.5* 7.5*  HCT 23.9* 24.0*  PLT 162 204   Recent Labs  Lab 11/15/23 0341 11/16/23 0346  NA 133* 135  K 4.0 3.8  CL 102 105  CO2 23 22  GLUCOSE 110* 92  BUN 13 10  CREATININE 0.91 0.84  CALCIUM 8.4* 8.5*  MG 1.6* 1.8  PHOS 3.0 4.5  ALBUMIN 2.4* 2.4*   CT Abd/pelvis 3/22: no acute findings [place] abd complete 3/25: No acute abnormalities   Results/Tests Pending at Time of Discharge: none  Discharge Medications:  Allergies as of 11/16/2023   No Known Allergies      Medication List     STOP taking these medications    indapamide 1.25 MG tablet Commonly known as: LOZOL   losartan 100 MG tablet Commonly known as: COZAAR       TAKE these medications    (feeding supplement) PROSource Plus liquid Take 30 mLs by mouth 2 (two) times daily between meals.   acetaminophen 325 MG tablet Commonly known as: TYLENOL Take 2 tablets (650 mg total) by mouth every 6 (six) hours as needed for mild pain (pain score 1-3) (or Fever >/= 101).   atorvastatin 40 MG tablet Commonly known as: LIPITOR TAKE 1 TABLET BY MOUTH DAILY What changed: when to take this   divalproex 250 MG DR tablet Commonly known as: DEPAKOTE Take 2 tablets (500 mg total) by mouth 2 (two) times daily. **PATIENT NEEDS AN OFFICE VISIT FOR FUTURE Rx REFILLS**   famotidine 20 MG tablet Commonly known as: PEPCID Take 1 tablet (20 mg total) by mouth 2 (two) times daily for 14 days.   levocetirizine 5  MG tablet Commonly known as: XYZAL TAKE 1 TABLET BY MOUTH 2 TIMES DAILY   nystatin 100000 UNIT/ML suspension Commonly known as: MYCOSTATIN Take 5 mLs (500,000 Units total) by mouth 4 (four) times daily for 5 days.   pantoprazole 40 MG tablet Commonly known as: PROTONIX Take 1 tablet (40 mg total) by mouth daily. Start taking on: November 17, 2023   polyethylene glycol 17 g packet Commonly known as: MIRALAX / GLYCOLAX Take 17 g by mouth daily. Start taking on: November 17, 2023   senna-docusate 8.6-50 MG tablet Commonly known as: Senokot-S Take 1 tablet by mouth at bedtime.   sucralfate 1 GM/10ML suspension Commonly known as: CARAFATE Take 10 mLs (1 g total) by mouth 4 (four) times daily -  with meals and at bedtime.   TEGretol 200 MG tablet Generic drug: carbamazepine TAKE 1 AND 1/2 TABLETS BY MOUTH  TWICE DAILY IN THE MORNING AND  AT BEDTIME   topiramate 25 MG tablet Commonly known as: TOPAMAX TAKE 3 TABLETS BY MOUTH TWICE  DAILY               Durable Medical Equipment  (From admission, onward)  Start     Ordered   11/16/23 1128  For home use only DME Walker rolling  Once       Comments: Needs Left Platform RW attachment  Question Answer Comment  Walker: With 5 Inch Wheels   Patient needs a walker to treat with the following condition Left hemiparesis (HCC)      11/16/23 1128            Discharge Instructions: Please refer to Patient Instructions section of EMR for full details.  Patient was counseled important signs and symptoms that should prompt return to medical care, changes in medications, dietary instructions, activity restrictions, and follow up appointments.   Follow-Up Appointments:  Follow-up Information     [place] Follow up.   Why: [facility] will provide home health services for PT/ OT.  They will call you in the next 24-48 hours to set up services. Contact information: [address] [place] [address] [phone]         [facility] Follow up.   Why: Please call [facility] to coordinate availability to a ramp at your home. Contact information: [phone]               Future Appointments  Date Time Provider Department Center  11/21/2023  8:50 AM ACCESS TO CARE POOL [facility]  03/24/2024  1:40 PM [facility] ANNUAL WELLNESS VISIT [facility]     Lorayne Bender, MD 11/16/2023, 4:04 PM PGY-1, [facility]  I have verified that the resident's  findings are accurately documented in the resident's note. I have made edits and changes where appropriate, and agree with plan.  Celine Mans, MD, PGY-2 [facility] 4:04 PM 11/16/2023

## 2023-11-16 NOTE — Progress Notes (Signed)
    Durable Medical Equipment  (From admission, onward)           Start     Ordered   11/16/23 1128  For home use only DME Walker rolling  Once       Comments: Needs Left Platform RW attachment  Question Answer Comment  Walker: With 5 Inch Wheels   Patient needs a walker to treat with the following condition Left hemiparesis (HCC)      11/16/23 1128

## 2023-11-16 NOTE — Plan of Care (Signed)
 Patient alert to self, aunt at bedside. RUA PICC. OOB to commode. Safety precautions maintained.   Discharge instructions discussed with aunt, verbalized understanding. PICC removed. Platform RW delivered at 1900. Patient left in no acute or respiratory distress. Escorted to transport with wheelchair.   Problem: Clinical Measurements: Goal: Ability to maintain clinical measurements within normal limits will improve Outcome: Progressing   Problem: Activity: Goal: Risk for activity intolerance will decrease Outcome: Progressing   Problem: Nutrition: Goal: Adequate nutrition will be maintained Outcome: Progressing

## 2023-11-16 NOTE — Assessment & Plan Note (Signed)
 Stable and much improved. -Continue oral nystatin rinse
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
## 2023-11-16 NOTE — Assessment & Plan Note (Addendum)
 Hemoglobin remains stable at 7.5, white cell count and platelets WNL.  Unclear etiology for pancytopenia, possibly medication ADE (fluconzole + AED?) -Continue SCDs for DVT prophylaxis due to ongoing anemia and likelihood of discharge home soon --lab holiday if still inpatient tomorrow AM
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
## 2023-11-16 NOTE — Plan of Care (Signed)

## 2023-11-16 NOTE — Assessment & Plan Note (Signed)
 GERD: as above Seizures: home tegretol 300mg  BID, topamax 75mg  BID Hypertension: Continue to hold home BP meds due to normotension Hyperlipidemia: Continue home atorvastatin 40 mg daily Constipation: Daily MiraLAX, senna
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
## 2023-11-16 NOTE — Progress Notes (Signed)
 Daily Progress Note Intern Pager: [phone]  Patient name: Patricia White Medical record number: [id number] Date of birth: 19-Jul-1971 Age: 53 y.o. Gender: female  Primary Care Provider: Celine Mans, MD Consultants: RD, palliative, GI (signed off) Code Status: DNR, limited  Pt Overview and Major Events to Date:  3/20: Admitted to FMTS 3/24: Rapid response for choking episode 3/26: PICC line placed due to difficulties with PIV placement well 3/27: EGD showed gastritis 3/28: Cortrak placed 4/3: Cortrak removed  Assessment and Plan: Patricia White is a 53 year old female with history of cerebral palsy, seizures, hypertension admitted for AKI (now resolved) and failure to thrive secondary to poor p.o. intake for several weeks.  Improved greatly with tube feeds and has now progressed to a full p.o. diet.  Stable for discharge home today with close PCP follow-up  Assessment & Plan FTT (failure to thrive) in adult Cortrak pulled on 4/3 and patient has been doing well with full p.o. feeds since then. Gastritis found on EGD. -RD following, appreciate diet recs -Palliative following, appreciate assistance with goals of care -Continue pantoprazole 40 mg, Carafate 1 g 3 times daily for gastritis per GI -PT/OT recommending home health PT, family agrees to this -Continue every 4h CBG checks -Elevate head of bed greater than 30 degrees with meals  Pancytopenia (HCC) Hemoglobin remains stable at 7.5, white cell count and platelets WNL.  Unclear etiology for pancytopenia, possibly medication ADE (fluconzole + AED?) -Continue SCDs for DVT prophylaxis due to ongoing anemia and likelihood of discharge home soon --lab holiday if still inpatient tomorrow AM  Oral thrush Stable and much improved. -Continue oral nystatin rinse  Seizure disorder (HCC) Stable.   --Continue home Depakote and Tegretol  Hypomagnesemia 1.8 today -lab holiday if still inpatient tomorrow AM Hypophosphatemia  (Resolved: 11/16/2023) WNL x 3 days. Chronic health problem GERD: as above Seizures: home tegretol 300mg  BID, topamax 75mg  BID Hypertension: Continue to hold home BP meds due to normotension Hyperlipidemia: Continue home atorvastatin 40 mg daily Constipation: Daily MiraLAX, senna    FEN/GI: Regular with supplements per RD PPx: SCDs Dispo: Home with home health PT pending clinical stability  Subjective:  Patient sitting up in bed eating breakfast.  Aunt and nursing at bedside states that patient ate very well yesterday and has been going to the bathroom with no issues.  When discussing plan to possibly go home today patient becomes tearful.  See separate plan of care note for details.  Objective: Temp:  [98.1 F (36.7 C)-99.3 F (37.4 C)] 98.6 F (37 C) (04/04 0057) Pulse Rate:  [101-107] 101 (04/04 0057) Resp:  [16-20] 16 (04/04 0057) BP: (109-122)/(79-85) 116/81 (04/04 0057) SpO2:  [98 %-100 %] 100 % (04/04 0057) Physical Exam: General: Sitting up in bed eating breakfast, awake and alert, in no acute distress Cardiovascular: RRR, normal S1/S2, no murmurs, rubs, gallops Respiratory: CTAB, normal work of breathing on room air Abdomen: Normoactive bowel sounds, soft, nontender, nondistended Extremities: No edema to BLE  Laboratory: Most recent CBC Lab Results  Component Value Date   WBC 4.7 11/16/2023   HGB 7.5 (L) 11/16/2023   HCT 24.0 (L) 11/16/2023   MCV 98.4 11/16/2023   PLT 204 11/16/2023   Most recent BMP    Latest Ref Rng & Units 11/16/2023    3:46 AM  BMP  Glucose 70 - 99 mg/dL 92   BUN 6 - 20 mg/dL 10   Creatinine 1.47 - 1.00 mg/dL 8.29   Sodium 562 - 130  mmol/L 135   Potassium 3.5 - 5.1 mmol/L 3.8   Chloride 98 - 111 mmol/L 105   CO2 22 - 32 mmol/L 22   Calcium 8.9 - 10.3 mg/dL 8.5     Other pertinent labs mag 1.8  Imaging/Diagnostic Tests: No new imaging or tests  Patricia Bender, MD 11/16/2023, 7:36 AM  PGY-1, [facility] Family Medicine FPTS  Intern pager: [phone], text pages welcome Secure chat group [facility] Teaching Service

## 2023-11-16 NOTE — Assessment & Plan Note (Addendum)
 1.8 today -lab holiday if still inpatient tomorrow AM

## 2023-11-16 NOTE — Assessment & Plan Note (Addendum)
 Cortrak pulled on 4/3 and patient has been doing well with full p.o. feeds since then. Gastritis found on EGD. -RD following, appreciate diet recs -Palliative following, appreciate assistance with goals of care -Continue pantoprazole 40 mg, Carafate 1 g 3 times daily for gastritis per GI -PT/OT recommending home health PT, family agrees to this -Continue every 4h CBG checks -Elevate head of bed greater than 30 degrees with meals

## 2023-11-19 ENCOUNTER — Inpatient Hospital Stay (HOSPITAL_COMMUNITY)
Admission: EM | Admit: 2023-11-19 | Discharge: 2023-11-26 | DRG: 100 | Disposition: A | Discharge status: Home Health | Attending: Family Medicine | Admitting: Family Medicine

## 2023-11-19 ENCOUNTER — Emergency Department (HOSPITAL_COMMUNITY)

## 2023-11-19 ENCOUNTER — Telehealth: Payer: Self-pay

## 2023-11-19 ENCOUNTER — Encounter (HOSPITAL_COMMUNITY): Payer: Self-pay

## 2023-11-19 ENCOUNTER — Other Ambulatory Visit (HOSPITAL_COMMUNITY): Payer: Self-pay

## 2023-11-19 ENCOUNTER — Other Ambulatory Visit: Payer: Self-pay

## 2023-11-19 DIAGNOSIS — Z9181 History of falling: Secondary | ICD-10-CM | POA: Diagnosis not present

## 2023-11-19 DIAGNOSIS — Z515 Encounter for palliative care: Secondary | ICD-10-CM

## 2023-11-19 DIAGNOSIS — R569 Unspecified convulsions: Secondary | ICD-10-CM | POA: Diagnosis not present

## 2023-11-19 DIAGNOSIS — E46 Unspecified protein-calorie malnutrition: Secondary | ICD-10-CM

## 2023-11-19 DIAGNOSIS — I959 Hypotension, unspecified: Secondary | ICD-10-CM | POA: Diagnosis not present

## 2023-11-19 DIAGNOSIS — Z6826 Body mass index (BMI) 26.0-26.9, adult: Secondary | ICD-10-CM

## 2023-11-19 DIAGNOSIS — J45909 Unspecified asthma, uncomplicated: Secondary | ICD-10-CM | POA: Diagnosis present

## 2023-11-19 DIAGNOSIS — D539 Nutritional anemia, unspecified: Secondary | ICD-10-CM | POA: Diagnosis present

## 2023-11-19 DIAGNOSIS — R4182 Altered mental status, unspecified: Secondary | ICD-10-CM | POA: Diagnosis not present

## 2023-11-19 DIAGNOSIS — K219 Gastro-esophageal reflux disease without esophagitis: Secondary | ICD-10-CM | POA: Diagnosis present

## 2023-11-19 DIAGNOSIS — Z91148 Patient's other noncompliance with medication regimen for other reason: Secondary | ICD-10-CM | POA: Diagnosis not present

## 2023-11-19 DIAGNOSIS — R0902 Hypoxemia: Secondary | ICD-10-CM | POA: Diagnosis not present

## 2023-11-19 DIAGNOSIS — G808 Other cerebral palsy: Secondary | ICD-10-CM | POA: Diagnosis present

## 2023-11-19 DIAGNOSIS — D649 Anemia, unspecified: Secondary | ICD-10-CM

## 2023-11-19 DIAGNOSIS — G40909 Epilepsy, unspecified, not intractable, without status epilepticus: Principal | ICD-10-CM

## 2023-11-19 DIAGNOSIS — Z66 Do not resuscitate: Secondary | ICD-10-CM | POA: Diagnosis present

## 2023-11-19 DIAGNOSIS — Z789 Other specified health status: Secondary | ICD-10-CM

## 2023-11-19 DIAGNOSIS — R627 Adult failure to thrive: Secondary | ICD-10-CM | POA: Diagnosis present

## 2023-11-19 DIAGNOSIS — R231 Pallor: Secondary | ICD-10-CM | POA: Diagnosis not present

## 2023-11-19 DIAGNOSIS — F79 Unspecified intellectual disabilities: Secondary | ICD-10-CM | POA: Diagnosis not present

## 2023-11-19 DIAGNOSIS — M7989 Other specified soft tissue disorders: Secondary | ICD-10-CM | POA: Diagnosis not present

## 2023-11-19 DIAGNOSIS — R579 Shock, unspecified: Secondary | ICD-10-CM

## 2023-11-19 DIAGNOSIS — Z7189 Other specified counseling: Secondary | ICD-10-CM | POA: Diagnosis not present

## 2023-11-19 DIAGNOSIS — R918 Other nonspecific abnormal finding of lung field: Secondary | ICD-10-CM | POA: Diagnosis not present

## 2023-11-19 DIAGNOSIS — G809 Cerebral palsy, unspecified: Secondary | ICD-10-CM | POA: Diagnosis not present

## 2023-11-19 DIAGNOSIS — Z87440 Personal history of urinary (tract) infections: Secondary | ICD-10-CM

## 2023-11-19 DIAGNOSIS — E43 Unspecified severe protein-calorie malnutrition: Secondary | ICD-10-CM | POA: Diagnosis present

## 2023-11-19 DIAGNOSIS — M7522 Bicipital tendinitis, left shoulder: Secondary | ICD-10-CM | POA: Diagnosis present

## 2023-11-19 DIAGNOSIS — T50915A Adverse effect of multiple unspecified drugs, medicaments and biological substances, initial encounter: Secondary | ICD-10-CM | POA: Diagnosis present

## 2023-11-19 DIAGNOSIS — N179 Acute kidney failure, unspecified: Secondary | ICD-10-CM | POA: Diagnosis present

## 2023-11-19 DIAGNOSIS — G40101 Localization-related (focal) (partial) symptomatic epilepsy and epileptic syndromes with simple partial seizures, not intractable, with status epilepticus: Secondary | ICD-10-CM | POA: Diagnosis present

## 2023-11-19 DIAGNOSIS — Z8249 Family history of ischemic heart disease and other diseases of the circulatory system: Secondary | ICD-10-CM

## 2023-11-19 DIAGNOSIS — R609 Edema, unspecified: Secondary | ICD-10-CM | POA: Diagnosis not present

## 2023-11-19 DIAGNOSIS — F71 Moderate intellectual disabilities: Secondary | ICD-10-CM | POA: Diagnosis present

## 2023-11-19 DIAGNOSIS — I10 Essential (primary) hypertension: Secondary | ICD-10-CM | POA: Diagnosis present

## 2023-11-19 DIAGNOSIS — R600 Localized edema: Secondary | ICD-10-CM | POA: Diagnosis not present

## 2023-11-19 DIAGNOSIS — M79602 Pain in left arm: Secondary | ICD-10-CM

## 2023-11-19 DIAGNOSIS — E876 Hypokalemia: Secondary | ICD-10-CM | POA: Diagnosis present

## 2023-11-19 DIAGNOSIS — R000 Tachycardia, unspecified: Secondary | ICD-10-CM | POA: Diagnosis not present

## 2023-11-19 DIAGNOSIS — E11649 Type 2 diabetes mellitus with hypoglycemia without coma: Secondary | ICD-10-CM | POA: Diagnosis not present

## 2023-11-19 DIAGNOSIS — M19012 Primary osteoarthritis, left shoulder: Secondary | ICD-10-CM | POA: Diagnosis not present

## 2023-11-19 DIAGNOSIS — G40901 Epilepsy, unspecified, not intractable, with status epilepticus: Secondary | ICD-10-CM | POA: Diagnosis not present

## 2023-11-19 DIAGNOSIS — J984 Other disorders of lung: Secondary | ICD-10-CM | POA: Diagnosis not present

## 2023-11-19 DIAGNOSIS — S42302D Unspecified fracture of shaft of humerus, left arm, subsequent encounter for fracture with routine healing: Secondary | ICD-10-CM | POA: Diagnosis not present

## 2023-11-19 DIAGNOSIS — R9082 White matter disease, unspecified: Secondary | ICD-10-CM | POA: Diagnosis not present

## 2023-11-19 DIAGNOSIS — I9589 Other hypotension: Secondary | ICD-10-CM | POA: Diagnosis present

## 2023-11-19 DIAGNOSIS — Z79899 Other long term (current) drug therapy: Secondary | ICD-10-CM | POA: Diagnosis not present

## 2023-11-19 DIAGNOSIS — E785 Hyperlipidemia, unspecified: Secondary | ICD-10-CM | POA: Diagnosis present

## 2023-11-19 LAB — CBC
HCT: 29.1 % — ABNORMAL LOW (ref 36.0–46.0)
Hemoglobin: 8.9 g/dL — ABNORMAL LOW (ref 12.0–15.0)
MCH: 31.3 pg (ref 26.0–34.0)
MCHC: 30.6 g/dL (ref 30.0–36.0)
MCV: 102.5 fL — ABNORMAL HIGH (ref 80.0–100.0)
Platelets: 450 10*3/uL — ABNORMAL HIGH (ref 150–400)
RBC: 2.84 MIL/uL — ABNORMAL LOW (ref 3.87–5.11)
RDW: 15.4 % (ref 11.5–15.5)
WBC: 8.6 10*3/uL (ref 4.0–10.5)
nRBC: 0 % (ref 0.0–0.2)

## 2023-11-19 LAB — COMPREHENSIVE METABOLIC PANEL WITH GFR
ALT: 14 U/L (ref 0–44)
AST: 27 U/L (ref 15–41)
Albumin: 2.5 g/dL — ABNORMAL LOW (ref 3.5–5.0)
Alkaline Phosphatase: 54 U/L (ref 38–126)
Anion gap: 10 (ref 5–15)
BUN: 7 mg/dL (ref 6–20)
CO2: 19 mmol/L — ABNORMAL LOW (ref 22–32)
Calcium: 8.2 mg/dL — ABNORMAL LOW (ref 8.9–10.3)
Chloride: 108 mmol/L (ref 98–111)
Creatinine, Ser: 1.15 mg/dL — ABNORMAL HIGH (ref 0.44–1.00)
GFR, Estimated: 58 mL/min — ABNORMAL LOW (ref >60)
Glucose, Bld: 101 mg/dL — ABNORMAL HIGH (ref 70–99)
Potassium: 3.4 mmol/L — ABNORMAL LOW (ref 3.5–5.1)
Sodium: 137 mmol/L (ref 135–145)
Total Bilirubin: 0.5 mg/dL (ref 0.0–1.2)
Total Protein: 7.2 g/dL (ref 6.5–8.1)

## 2023-11-19 LAB — CBC WITH DIFFERENTIAL/PLATELET
Abs Immature Granulocytes: 0.02 10*3/uL (ref 0.00–0.07)
Basophils Absolute: 0 10*3/uL (ref 0.0–0.1)
Basophils Relative: 1 %
Eosinophils Absolute: 0.1 10*3/uL (ref 0.0–0.5)
Eosinophils Relative: 1 %
HCT: 23.4 % — ABNORMAL LOW (ref 36.0–46.0)
Hemoglobin: 7 g/dL — ABNORMAL LOW (ref 12.0–15.0)
Immature Granulocytes: 0 %
Lymphocytes Relative: 18 %
Lymphs Abs: 0.9 10*3/uL (ref 0.7–4.0)
MCH: 31.1 pg (ref 26.0–34.0)
MCHC: 29.9 g/dL — ABNORMAL LOW (ref 30.0–36.0)
MCV: 104 fL — ABNORMAL HIGH (ref 80.0–100.0)
Monocytes Absolute: 0.7 10*3/uL (ref 0.1–1.0)
Monocytes Relative: 14 %
Neutro Abs: 3.5 10*3/uL (ref 1.7–7.7)
Neutrophils Relative %: 66 %
Platelets: 368 10*3/uL (ref 150–400)
RBC: 2.25 MIL/uL — ABNORMAL LOW (ref 3.87–5.11)
RDW: 15.4 % (ref 11.5–15.5)
WBC: 5.3 10*3/uL (ref 4.0–10.5)
nRBC: 0 % (ref 0.0–0.2)

## 2023-11-19 LAB — PHOSPHORUS: Phosphorus: 3 mg/dL (ref 2.5–4.6)

## 2023-11-19 LAB — CBG MONITORING, ED: Glucose-Capillary: 102 mg/dL — ABNORMAL HIGH (ref 70–99)

## 2023-11-19 LAB — VALPROIC ACID LEVEL: Valproic Acid Lvl: <10 ug/mL — ABNORMAL LOW (ref 50.0–100.0)

## 2023-11-19 LAB — MAGNESIUM: Magnesium: 1.5 mg/dL — ABNORMAL LOW (ref 1.7–2.4)

## 2023-11-19 LAB — CARBAMAZEPINE LEVEL, TOTAL: Carbamazepine Lvl: 2.3 ug/mL — ABNORMAL LOW (ref 4.0–12.0)

## 2023-11-19 LAB — HCG, SERUM, QUALITATIVE: Preg, Serum: NEGATIVE

## 2023-11-19 MED ORDER — LEVETIRACETAM IN NACL 1000 MG/100ML IV SOLN
1000.0000 mg | Freq: Once | INTRAVENOUS | Status: AC
  Administered 2023-11-19: 1000 mg via INTRAVENOUS
  Filled 2023-11-19: qty 100

## 2023-11-19 MED ORDER — LORAZEPAM 2 MG/ML IJ SOLN
1.0000 mg | Freq: Once | INTRAMUSCULAR | Status: AC
  Administered 2023-11-19: 1 mg via INTRAVENOUS

## 2023-11-19 MED ORDER — LEVETIRACETAM IN NACL 1500 MG/100ML IV SOLN
1500.0000 mg | Freq: Two times a day (BID) | INTRAVENOUS | Status: DC
  Administered 2023-11-20 – 2023-11-22 (×6): 1500 mg via INTRAVENOUS
  Filled 2023-11-19 (×7): qty 100

## 2023-11-19 MED ORDER — SODIUM CHLORIDE 0.9 % IV SOLN: INTRAVENOUS | Status: AC

## 2023-11-19 MED ORDER — SODIUM CHLORIDE 0.9 % IV SOLN
200.0000 mg | Freq: Two times a day (BID) | INTRAVENOUS | Status: DC
  Administered 2023-11-20 – 2023-11-22 (×5): 200 mg via INTRAVENOUS
  Filled 2023-11-19 (×8): qty 20

## 2023-11-19 MED ORDER — MAGNESIUM SULFATE 2 GM/50ML IV SOLN
2.0000 g | Freq: Once | INTRAVENOUS | Status: AC
Start: 2023-11-19 — End: 2023-11-19
  Administered 2023-11-19: 2 g via INTRAVENOUS
  Filled 2023-11-19: qty 50

## 2023-11-19 MED ORDER — LORAZEPAM 2 MG/ML IJ SOLN
INTRAMUSCULAR | Status: AC
  Administered 2023-11-19: 2 mg
  Filled 2023-11-19: qty 1

## 2023-11-19 MED ORDER — NOREPINEPHRINE 4 MG/250ML-% IV SOLN
2.0000 ug/min | INTRAVENOUS | Status: DC
  Administered 2023-11-19: 2 ug/min via INTRAVENOUS
  Administered 2023-11-20: 10 ug/min via INTRAVENOUS
  Administered 2023-11-20: 7 ug/min via INTRAVENOUS
  Filled 2023-11-19 (×3): qty 250

## 2023-11-19 MED ORDER — VALPROATE SODIUM 100 MG/ML IV SOLN
2800.0000 mg | INTRAVENOUS | Status: AC
  Administered 2023-11-19: 2800 mg via INTRAVENOUS
  Filled 2023-11-19: qty 28

## 2023-11-19 MED ORDER — CHLORHEXIDINE GLUCONATE CLOTH 2 % EX PADS
6.0000 | MEDICATED_PAD | Freq: Every day | CUTANEOUS | Status: DC
  Administered 2023-11-20 – 2023-11-25 (×5): 6 via TOPICAL

## 2023-11-19 MED ORDER — LORAZEPAM 2 MG/ML IJ SOLN
INTRAMUSCULAR | Status: AC
  Administered 2023-11-19: 1 mg via INTRAVENOUS
  Filled 2023-11-19: qty 1

## 2023-11-19 MED ORDER — VALPROATE SODIUM 100 MG/ML IV SOLN
500.0000 mg | Freq: Two times a day (BID) | INTRAVENOUS | Status: DC
  Administered 2023-11-19: 500 mg via INTRAVENOUS
  Filled 2023-11-19 (×5): qty 5

## 2023-11-19 MED ORDER — SODIUM CHLORIDE 0.9 % IV SOLN
200.0000 mg | INTRAVENOUS | Status: AC
  Administered 2023-11-19: 200 mg via INTRAVENOUS
  Filled 2023-11-19: qty 20

## 2023-11-19 MED ORDER — SODIUM CHLORIDE 0.9 % IV BOLUS
1000.0000 mL | Freq: Once | INTRAVENOUS | Status: AC
  Administered 2023-11-19: 1000 mL via INTRAVENOUS

## 2023-11-19 MED ORDER — SODIUM CHLORIDE 0.9 % IV SOLN: 200.0000 mg | INTRAVENOUS | Status: DC

## 2023-11-19 MED ORDER — POTASSIUM CHLORIDE 10 MEQ/100ML IV SOLN
10.0000 meq | INTRAVENOUS | Status: AC
  Administered 2023-11-19: 10 meq via INTRAVENOUS
  Filled 2023-11-19 (×2): qty 100

## 2023-11-19 MED ORDER — LORAZEPAM 2 MG/ML IJ SOLN
2.0000 mg | INTRAMUSCULAR | Status: DC | PRN
  Filled 2023-11-19: qty 1

## 2023-11-19 MED ORDER — SODIUM CHLORIDE 0.9 % IV SOLN
250.0000 mL | INTRAVENOUS | Status: AC
  Administered 2023-11-19: 250 mL via INTRAVENOUS

## 2023-11-19 MED ORDER — SODIUM CHLORIDE 0.9 % IV SOLN: 3000.0000 mg | Freq: Once | INTRAVENOUS | Status: DC

## 2023-11-19 MED ORDER — ENOXAPARIN SODIUM 40 MG/0.4ML IJ SOSY
40.0000 mg | PREFILLED_SYRINGE | INTRAMUSCULAR | Status: DC
  Administered 2023-11-19 – 2023-11-25 (×7): 40 mg via SUBCUTANEOUS
  Filled 2023-11-19 (×7): qty 0.4

## 2023-11-19 NOTE — ED Notes (Signed)
 Verbal order to take Depakote from 55mg /hr to 110mg /hr per the neurologist

## 2023-11-19 NOTE — Care Plan (Signed)
 LTM eeg continues to shows subclinical/electrographic status arising from right hemisphere. Dr Amada Jupiter notified at 1556 and Dr Derry Lory notified again at 1920.  Patricia White Annabelle Harman

## 2023-11-19 NOTE — Consult Note (Signed)
 NEUROLOGY CONSULT NOTE   Date of service: November 19, 2023 Patient Name: Patricia White MRN:  [id number] DOB:  04/13/1971 Chief Complaint: "seizure" Requesting Provider: Rexford Maus, DO  History of Present Illness  Patricia White is a 53 y.o. female with hx of seizures, cerebral palsy, mental retardation, nonverbal, failure to thrive, dehydration, gastritis/gastroduodenitis, severe malnutrition who was brought in by EMS after having 4 witnessed generalized tonic-clonic seizures. Patient reportedly had 2 seizures at home, EMS was called by Mom, she then had 2 additional seizures en route with EMS. She was given 2.5mg  of Versed during the last seizure, which stopped the tonic-clonic activity immediately. Patient arrived on NRB, receiving 1L of NS, hypotensive with SBP in the high 80s. Patient then had another seizure in the ED while being transported to her room, another 2.5mg  of Versed was given by EMS.   On exam, patient is post-ictal, not responding verbally or following commands. She does withdraw slightly in all extremities.  Per chart review, patient is on the following seizure medications: Depakote, Topamax, Tegretol. It looks like she has not filled the Tegretol since a small supply in December. However, she was receiving all three of these medications and was discharged on them 4/4.   Patient was just discharged 4/4 after being admitted for Failure to Thrive, decreased appetite for weeks. She refused food. Code Blue was called during a choking episode, no loss of pulse noted. After that, she reportedly went 14 days without PO intake. She was placed on PPI and carafate for sequela of prior gastritis, and treated with IV fluconazole for 5 days. CorTrak was placed, PO intake improved by discharge. She also had a UTI during this admission and was treated with ceftriaxone with resolved symptoms. Palliative was consulted and patient was made DNR after conversations with patient's aunt. All 3  seizure medications listed above were noted on discharge summary.   Patient admitted December 2024 for AKI, Acute Cystitis. Notes during this admission describe poor PO intake as well.     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   Seizures (HCC)     Past Surgical History:  Procedure Laterality Date   ESOPHAGOGASTRODUODENOSCOPY N/A 11/08/2023   Procedure: EGD (ESOPHAGOGASTRODUODENOSCOPY);  Surgeon: Shellia Cleverly, DO;  Location: [facility] ENDOSCOPY;  Service: Gastroenterology;  Laterality: N/A;   POLYPECTOMY  11/08/2023   Procedure: POLYPECTOMY;  Surgeon: Shellia Cleverly, DO;  Location: [facility] ENDOSCOPY;  Service: Gastroenterology;;    Family History: Family History  Problem Relation Age of Onset   Migraines Mother    Cancer Mother        liver cancer   Diabetes Maternal Aunt    Hypertension Maternal Grandmother    Colon cancer Maternal Grandfather 60   Prostate cancer Maternal Grandfather    Throat cancer Maternal Grandfather     Social History  reports that she has never smoked. She has never used smokeless tobacco. She reports that she does not drink alcohol and does not use drugs.  No Known Allergies  Medications   Current Facility-Administered Medications:    sodium chloride 0.9 % bolus 1,000 mL, 1,000 mL, Intravenous, Once **AND** 0.9 %  sodium chloride infusion, , Intravenous, Continuous, Kingsley, Victoria K, DO   levETIRAcetam (KEPPRA) IVPB 1000 mg/100 mL premix, 1,000 mg, Intravenous, Once **  FOLLOWED BY** levETIRAcetam (KEPPRA) IVPB 1000 mg/100 mL premix, 1,000 mg, Intravenous, Once **FOLLOWED BY** levETIRAcetam (KEPPRA) IVPB 1000 mg/100 mL premix, 1,000 mg, Intravenous, Once, Julicia Krieger, Hardin Negus, MD   LORazepam (ATIVAN) 2 MG/ML injection, , , ,    omalizumab (XOLAIR) prefilled syringe 300 mg, 300 mg,  Subcutaneous, Q28 days, Marcelyn Bruins, MD, 300 mg at 11/19/20 1559  Current Outpatient Medications:    acetaminophen (TYLENOL) 325 MG tablet, Take 2 tablets (650 mg total) by mouth every 6 (six) hours as needed for mild pain (pain score 1-3) (or Fever >/= 101)., Disp: , Rfl:    atorvastatin (LIPITOR) 40 MG tablet, TAKE 1 TABLET BY MOUTH DAILY (Patient taking differently: Take 40 mg by mouth in the morning.), Disp: 100 tablet, Rfl: 2   divalproex (DEPAKOTE) 250 MG DR tablet, Take 2 tablets (500 mg total) by mouth 2 (two) times daily. **PATIENT NEEDS AN OFFICE VISIT FOR FUTURE Rx REFILLS**, Disp: 360 tablet, Rfl: 0   famotidine (PEPCID) 20 MG tablet, Take 1 tablet (20 mg total) by mouth 2 (two) times daily for 14 days., Disp: 28 tablet, Rfl: 0   levocetirizine (XYZAL) 5 MG tablet, TAKE 1 TABLET BY MOUTH 2 TIMES DAILY, Disp: 60 tablet, Rfl: 5   Nutritional Supplements (,FEEDING SUPPLEMENT, PROSOURCE PLUS) liquid, Take 30 mLs by mouth 2 (two) times daily between meals., Disp: 887 mL, Rfl: 2   nystatin (MYCOSTATIN) 100000 UNIT/ML suspension, Take 5 mLs (500,000 Units total) by mouth 4 (four) times daily for 5 days., Disp: 60 mL, Rfl: 0   pantoprazole (PROTONIX) 40 MG tablet, Take 1 tablet (40 mg total) by mouth daily., Disp: 30 tablet, Rfl: 0   polyethylene glycol (MIRALAX / GLYCOLAX) 17 g packet, Take 17 g by mouth daily., Disp: , Rfl:    senna-docusate (SENOKOT-S) 8.6-50 MG tablet, Take 1 tablet by mouth at bedtime., Disp: , Rfl:    sucralfate (CARAFATE) 1 GM/10ML suspension, Take 10 mLs (1 g total) by mouth 4 (four) times daily -  with meals and at bedtime., Disp: 414 mL, Rfl: 2   TEGRETOL 200 MG tablet, TAKE 1 AND 1/2 TABLETS BY MOUTH  TWICE DAILY IN THE MORNING AND  AT BEDTIME, Disp: 42 tablet, Rfl: 0   topiramate (TOPAMAX) 25 MG tablet, TAKE 3 TABLETS BY MOUTH TWICE  DAILY, Disp: 180 tablet, Rfl: 0  Vitals  There were no vitals filed for this visit.  There is no height or weight on  file to calculate BMI.  Physical Exam   Constitutional: Appears chronically ill and poorly nourished Cardiovascular: Normal rate and regular rhythm. Hypotensive with SBP in high 80s. Respiratory: NRB required, Shallow breathing  Neurologic Examination   Patient is post-ictal.  Does not open eyes to voice or noxious stimuli.  Does not follow commands.  Does withdraw to noxious stimuli in all extremities.   Labs/Imaging/Neurodiagnostic studies   CBC:  Recent Labs  Lab 22-Nov-2023 0417 11/15/23 0341 11/16/23 0346  WBC 4.2 3.7* 4.7  NEUTROABS 1.9  --   --   HGB 7.5* 7.5* 7.5*  HCT 23.8* 23.9* 24.0*  MCV 98.3 97.6 98.4  PLT 137* 162 204   Basic Metabolic Panel:  Lab Results  Component Value Date   NA 135 11/16/2023   K 3.8 11/16/2023   CO2 22 11/16/2023   GLUCOSE 92 11/16/2023   BUN 10 11/16/2023   CREATININE 0.84 11/16/2023   CALCIUM 8.5 (L) 11/16/2023   GFRNONAA >60 11/16/2023   GFRAA >60 01/19/2020  Lipid Panel:  Lab Results  Component Value Date   LDLCALC 121 (H) 11/23/2020   HgbA1c:  Lab Results  Component Value Date   HGBA1C 5.7 02/12/2023   Urine Drug Screen: No results found for: "LABOPIA", "COCAINSCRNUR", "LABBENZ", "AMPHETMU", "THCU", "LABBARB"  Alcohol Level No results found for: "ETH" INR  Lab Results  Component Value Date   INR 1.0 09/14/2019   APTT  Lab Results  Component Value Date   APTT 28 09/14/2019   AED levels: Depakote level ordered   ASSESSMENT   Patricia White is a 53 y.o. female with hx of seizures, cerebral palsy, mental retardation, nonverbal, failure to thrive, dehydration, gastritis/gastroduodenitis, severe malnutrition who was brought in by EMS after having 4 witnessed generalized tonic-clonic seizures. She was given 2.5mg  of Versed during the last seizure, which stopped the tonic-clonic activity immediately. Patient arrived on NRB, receiving 1L of NS, hypotensive with SBP in the high 80s. Patient then had another seizure  in the ED while being transported to her room, another 2.5mg  of Versed was given by EMS.   On exam, patient is post-ictal requiring NRB and supplemental oxygen. She does not follow commands or open eyes to voice, withdraws in all extremities.   Patient has noted history of seizures with notes poor PO intake, malnutrition and failure to thrive over the past 6 months with DNR/DNI code status ordered previously. She has also had multiple respiratory illnesses with productive coughs noted as well as UTIs with antibiotic treatments given. She was discharged on all 3 of her seizure medications on 4/4, after admission for failure to thrive and gastritis.   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  RECOMMENDATIONS   - Keppra load 3g/kg - Continue home depakote, change to IV - Continue home tegretol and topamax once PO access is achieved - Depakote level and Tegretol level before medication administration - Avoid dehydration, hypotension - UA, CXR to check for infectious process that could lower seizure threshold - CMP, Mag, B12, Folate, B1 labs as patient has a history of poor PO intake and malnutrition ______________________________________________________________________   Pt seen by Neuro NP/APP and later by MD. Note/plan to be edited by MD as needed.    Patricia January, DNP, AGACNP-BC [facility] Please use AMION for contact information & EPIC for messaging.  I have seen the patient and reviewed the above note.  She has received 5 mg of IV Versed as well as 4 g of Keppra (approximately 60/kg).  She has had another clinical seizure and EEG continues to show frequent discharges consistent with focal status.  She will need more aggressive management, and I would favor Depakote.  I am hesitant with her DNI status as well as  with her severe hypotension to be more aggressive with highly sedating medications, but if she does not break with her Depakote load, then we may have to consider intubation for control of seizures.  With Depakote level less than 10, I am highly suspicious for medication noncompliance at home despite report of compliance.  LTM EEG and will follow EEG.   This patient is critically ill and at significant risk of neurological worsening, death and care requires constant monitoring of vital signs, hemodynamics,respiratory and cardiac monitoring, neurological assessment, discussion with family, other specialists and medical decision making of high complexity. I spent 45 minutes of neurocritical care time  in the care of  this  patient. This was time spent independent of any time provided by nurse practitioner or PA.  Ritta Slot, MD [facility]   If 7pm- 7am, please page neurology on call as listed in AMION. 11/19/2023  4:01 PM

## 2023-11-19 NOTE — ED Notes (Signed)
 Verbal order for ativan for pt. Pt seizing, initial order for 1mg , after administration pt still seizing. Verbal for an additional mg. Total of 2mg  of ativan given

## 2023-11-19 NOTE — ED Notes (Addendum)
 Writer called [id number] to give report and was told that they were not able to receive report at the moment and that they were waiting to see which of the new admits were going to be pulled and will call back as soon as they were told

## 2023-11-19 NOTE — ED Provider Notes (Signed)
 [facility] EMERGENCY DEPARTMENT AT [facility] Provider Note   CSN: [id number] Arrival date & time: 11/19/23  1305     History  Chief Complaint  Patient presents with   Seizures    Patricia White is a 53 y.o. female.  Patricia White is a 53 y.o.female with a history of cerebral palsy, GERD, seizures on Depakote, Tegretol and topamax, HTN, HLD presenting to the emergency department with status epilepticus.  Per EMS, the patient's grandmother called 911 this morning for her seizures.  She had 2 seizures at home prior to their arrival and have had a total of 3 seizures with EMS and route.  Is gotten a total of 5 mg of Versed.  EMS reports that her seizures have been tonic-clonic..  They state that she has not returned to her baseline and has been a GCS of 6-7 between each seizure.  Patient's grandmother reports that she has otherwise been acting like her normal self.  Did not want to eat anything this morning but has been eating and drinking normally the last couple of days without any fevers, vomiting or diarrhea.  The history is provided by a relative and the EMS personnel.  Seizures      Home Medications Prior to Admission medications   Medication Sig Start Date End Date Taking? Authorizing Provider  acetaminophen (TYLENOL) 325 MG tablet Take 2 tablets (650 mg total) by mouth every 6 (six) hours as needed for mild pain (pain score 1-3) (or Fever >/= 101). 11/16/23   Celine Mans, MD  atorvastatin (LIPITOR) 40 MG tablet TAKE 1 TABLET BY MOUTH DAILY Patient taking differently: Take 40 mg by mouth in the morning. 07/30/23   Celine Mans, MD  divalproex (DEPAKOTE) 250 MG DR tablet Take 2 tablets (500 mg total) by mouth 2 (two) times daily. **PATIENT NEEDS AN OFFICE VISIT FOR FUTURE Rx REFILLS** 08/01/23   Ihor Austin, NP  famotidine (PEPCID) 20 MG tablet Take 1 tablet (20 mg total) by mouth 2 (two) times daily for 14 days. 07/17/23 11/01/23  Celine Mans, MD   levocetirizine (XYZAL) 5 MG tablet TAKE 1 TABLET BY MOUTH 2 TIMES DAILY 03/17/21   Marcelyn Bruins, MD  Nutritional Supplements (,FEEDING SUPPLEMENT, PROSOURCE PLUS) liquid Take 30 mLs by mouth 2 (two) times daily between meals. 11/16/23   Celine Mans, MD  nystatin (MYCOSTATIN) 100000 UNIT/ML suspension Take 5 mLs (500,000 Units total) by mouth 4 (four) times daily for 5 days. 11/16/23 11/21/23  Celine Mans, MD  pantoprazole (PROTONIX) 40 MG tablet Take 1 tablet (40 mg total) by mouth daily. 11/17/23   Celine Mans, MD  polyethylene glycol (MIRALAX / GLYCOLAX) 17 g packet Take 17 g by mouth daily. 11/17/23   Celine Mans, MD  senna-docusate (SENOKOT-S) 8.6-50 MG tablet Take 1 tablet by mouth at bedtime. 11/16/23   Celine Mans, MD  sucralfate (CARAFATE) 1 GM/10ML suspension Take 10 mLs (1 g total) by mouth 4 (four) times daily -  with meals and at bedtime. 11/16/23   Celine Mans, MD  TEGRETOL 200 MG tablet TAKE 1 AND 1/2 TABLETS BY MOUTH  TWICE DAILY IN THE MORNING AND  AT BEDTIME 08/07/23   Penumalli, Glenford Bayley, MD  topiramate (TOPAMAX) 25 MG tablet TAKE 3 TABLETS BY MOUTH TWICE  DAILY 08/16/23   Ihor Austin, NP      Allergies    Patient has no known allergies.    Review of Systems   Review of Systems  Neurological:  Positive for seizures.    Physical Exam Updated Vital Signs BP (!) 134/94   Pulse 95   Temp 97.9 F (36.6 C)   Resp (!) 23   Ht 5\' 5"  (1.651 m)   Wt 71.5 kg   SpO2 100%   BMI 26.23 kg/m  Physical Exam Vitals and nursing note reviewed.  Constitutional:      Comments: Postictal appearing, sonorous respirations with nonrebreather in place  HENT:     Head: Normocephalic and atraumatic.     Nose: Nose normal.     Mouth/Throat:     Mouth: Mucous membranes are moist.     Pharynx: Oropharynx is clear.  Eyes:     Extraocular Movements: Extraocular movements intact.     Comments: Pupils pinpoint and equal bilaterally  Cardiovascular:     Rate  and Rhythm: Regular rhythm. Tachycardia present.     Heart sounds: Normal heart sounds.  Pulmonary:     Effort: Pulmonary effort is normal.     Breath sounds: Normal breath sounds.  Abdominal:     General: Abdomen is flat.     Palpations: Abdomen is soft.     Tenderness: There is no abdominal tenderness.  Musculoskeletal:        General: No deformity.     Cervical back: Normal range of motion.  Skin:    General: Skin is warm and dry.  Neurological:     Comments: Oriented x 0, withdrawing in all 4 extremities     ED Results / Procedures / Treatments   Labs (all labs ordered are listed, but only abnormal results are displayed) Labs Reviewed  COMPREHENSIVE METABOLIC PANEL WITH GFR - Abnormal; Notable for the following components:      Result Value   Potassium 3.4 (*)    CO2 19 (*)    Glucose, Bld 101 (*)    Creatinine, Ser 1.15 (*)    Calcium 8.2 (*)    Albumin 2.5 (*)    GFR, Estimated 58 (*)    All other components within normal limits  CBC WITH DIFFERENTIAL/PLATELET - Abnormal; Notable for the following components:   RBC 2.25 (*)    Hemoglobin 7.0 (*)    HCT 23.4 (*)    MCV 104.0 (*)    MCHC 29.9 (*)    All other components within normal limits  MAGNESIUM - Abnormal; Notable for the following components:   Magnesium 1.5 (*)    All other components within normal limits  CBG MONITORING, ED - Abnormal; Notable for the following components:   Glucose-Capillary 102 (*)    All other components within normal limits  PHOSPHORUS  HCG, SERUM, QUALITATIVE  VALPROIC ACID LEVEL  CARBAMAZEPINE LEVEL, TOTAL    EKG None  Radiology No results found.  Procedures .Critical Care  Performed by: Rexford Maus, DO Authorized by: Rexford Maus, DO   Critical care provider statement:    Critical care time (minutes):  40   Critical care was time spent personally by me on the following activities:  Development of treatment plan with patient or surrogate,  discussions with consultants, evaluation of patient's response to treatment, examination of patient, ordering and review of laboratory studies, ordering and review of radiographic studies, ordering and performing treatments and interventions, pulse oximetry, re-evaluation of patient's condition and review of old charts     Medications Ordered in ED Medications  sodium chloride 0.9 % bolus 1,000 mL (1,000 mLs Intravenous New Bag/Given 11/19/23 1345)    And  0.9 %  sodium chloride infusion ( Intravenous New Bag/Given 11/19/23 1441)  0.9 %  sodium chloride infusion (250 mLs Intravenous New Bag/Given 11/19/23 1402)  norepinephrine (LEVOPHED) 4mg  in (0.016 mg/mL) premix infusion (2 mcg/min Intravenous New Bag/Given 11/19/23 1354)  levETIRAcetam (KEPPRA) IVPB 1000 mg/100 mL premix (1,000 mg Intravenous New Bag/Given 11/19/23 1440)  valproate (DEPACON) 500 mg in dextrose 5 % 50 mL IVPB (has no administration in time range)  magnesium sulfate IVPB 2 g 50 mL (has no administration in time range)  potassium chloride 10 mEq in 100 mL IVPB (has no administration in time range)  levETIRAcetam (KEPPRA) IVPB 1000 mg/100 mL premix (0 mg Intravenous Stopped 11/19/23 1359)    Followed by  levETIRAcetam (KEPPRA) IVPB 1000 mg/100 mL premix (0 mg Intravenous Stopped 11/19/23 1400)    Followed by  levETIRAcetam (KEPPRA) IVPB 1000 mg/100 mL premix (0 mg Intravenous Stopped 11/19/23 1400)  LORazepam (ATIVAN) 2 MG/ML injection (2 mg  Given 11/19/23 1347)    ED Course/ Medical Decision Making/ A&P Clinical Course as of 11/19/23 1454  Mon Nov 19, 2023  1436 Neurology plan to place the patient on LTM, she is continued to be hypotensive and was started on levo.  Currently on 6 of levo with blood pressures in the 90s systolic.  Will require critical care admission. [VK]  1452 Mild AKI with mild hypokalemia and hypomag, will be given repletion. Critical care consulted for admission. [VK]    Clinical Course User Index [VK]  Rexford Maus, DO                                 Medical Decision Making This patient presents to the ED with chief complaint(s) of status epilepticus with pertinent past medical history of cerebral palsy, MR, seizures, GERD, hypertension which further complicates the presenting complaint. The complaint involves an extensive differential diagnosis and also carries with it a high risk of complications and morbidity.    The differential diagnosis includes medication noncompliance, dehydration, electrolyte abnormality, no report of trauma making ICH or mass effect unlikely, hypo or hyperglycemia  Additional history obtained: Additional history obtained from EMS  Records reviewed previous admission documents  ED Course and Reassessment: On patient's arrival she is hypotensive and postictal appearing.  Did have an additional seizure while on EMS stretcher on her arrival here and was given an second dose of Versed.  The patient was given about 750 mL of fluid by EMS and will be given additional fluid bolus here.  Neurology evaluated the patient at bedside immediately upon her arrival and recommended additional Keppra load with 6 g.  Patient will have further seizure workup and will be closely monitored.  Did have multiple records of DNR/DNI on previous admissions.  I spoke with her grandmother who states that she is DNR/DNI, she is okay with pressors support. Patient currently satting well on NRB with nasal trumpet in place, airway has been suctioned.  Independent labs interpretation:  The following labs were independently interpreted: mild hypokalemia and hypomag, mild AKI, anemia at baseline  Independent visualization of imaging: - I independently visualized the following imaging with scope of interpretation limited to determining acute life threatening conditions related to emergency care: CXR - pending  Consultation: - Consulted or discussed management/test interpretation w/ external  professional: neurology, critical care  Consideration for admission or further workup: patient requires admission for status epilepticus Social Determinants of health: N/A  Amount and/or Complexity of Data Reviewed Labs: ordered. Radiology: ordered.  Risk Prescription drug management. Decision regarding hospitalization.          Final Clinical Impression(s) / ED Diagnoses Final diagnoses:  Status epilepticus (HCC)  [facility])    Rx / DC Orders ED Discharge Orders     None         Rexford Maus, DO 11/19/23 1454

## 2023-11-19 NOTE — ED Notes (Signed)
 Pt grandmothers number [phone]

## 2023-11-19 NOTE — ED Notes (Signed)
 Seizure like activity no longer noted on assessment.

## 2023-11-19 NOTE — ED Notes (Signed)
 Pt is having seizure like activity (mild tremors, increased activity on EEG). ER physician notified as well as Agarwala, MD paged and secure chat.

## 2023-11-19 NOTE — Progress Notes (Signed)
 LTM EEG hooked up and running - no initial skin breakdown - push button tested - [facility] monitoring.

## 2023-11-19 NOTE — Transitions of Care (Post Inpatient/ED Visit) (Signed)
   11/19/2023  Name: Patricia White MRN: [id number] DOB: [birth year]  Today's TOC FU Call Status: Today's TOC FU Call Status:: Unsuccessful Call (1st Attempt) Unsuccessful Call (1st Attempt) Date: 11/19/23  Attempted to reach the patient regarding the most recent Inpatient/ED visit.  Follow Up Plan: Additional outreach attempts will be made to reach the patient to complete the Transitions of Care (Post Inpatient/ED visit) call.   Hilbert Odor RN, CCM [place]  VBCI-Population Health RN Care Manager [phone]

## 2023-11-19 NOTE — Progress Notes (Signed)
 I went by to evaluate Ms. Patricia White given persistent focal R hemispheric status on LTM EEG. She is barely protecting her airway and is unarousable. I am unable to get in touch with her guardian to clarify code status. Given she is DNR/DNI, I do not have much choice but to hold further AED escalation to prevent airway compromise.  Erick Blinks [facility]

## 2023-11-19 NOTE — ED Notes (Signed)
 Pt had episode of urinary incontinence. Writer and NT cleaned up patient and changed linens.

## 2023-11-19 NOTE — H&P (Signed)
 NAME:  Patricia White, MRN:  [id number], DOB:  Dec 22, 1970, LOS: 0 ADMISSION DATE:  11/19/2023, CONSULTATION DATE:  11/19/2023 REFERRING MD:  Theresia Lo - ED [facility], CHIEF COMPLAINT:  status epilepticus.    History of Present Illness:  53 year old woman who suffered 4 witnessed GTC's without full return of LOC.   Has CP and was recently discharged for FTT with poor oral intake. Loaded with Keppra, home medications continued.  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 Authorizing Provider  acetaminophen (TYLENOL) 325 MG tablet Take 2 tablets (650 mg total) by mouth every 6 (six) hours as needed for mild pain (pain score 1-3) (or Fever >/= 101). 11/16/23   Celine Mans, MD  atorvastatin (LIPITOR) 40 MG tablet TAKE 1 TABLET BY MOUTH DAILY Patient taking differently: Take 40 mg by mouth in the morning. 07/30/23   Celine Mans, MD  divalproex (DEPAKOTE) 250 MG DR tablet Take 2 tablets (500 mg total) by mouth 2 (two) times daily. **PATIENT NEEDS AN OFFICE VISIT FOR FUTURE Rx REFILLS** 08/01/23   Ihor Austin, NP  famotidine (PEPCID) 20 MG tablet Take 1 tablet (20 mg total) by mouth 2 (two) times daily for 14 days. 07/17/23 11/01/23  Celine Mans, MD  levocetirizine (XYZAL) 5 MG tablet TAKE 1 TABLET BY MOUTH 2 TIMES DAILY 03/17/21   Marcelyn Bruins, MD  Nutritional Supplements  (,FEEDING SUPPLEMENT, PROSOURCE PLUS) liquid Take 30 mLs by mouth 2 (two) times daily between meals. 11/16/23   Celine Mans, MD  nystatin (MYCOSTATIN) 100000 UNIT/ML suspension Take 5 mLs (500,000 Units total) by mouth 4 (four) times daily for 5 days. 11/16/23 11/21/23  Celine Mans, MD  pantoprazole (PROTONIX) 40 MG tablet Take 1 tablet (40 mg total) by mouth daily. 11/17/23   Celine Mans, MD  polyethylene glycol (MIRALAX / GLYCOLAX) 17 g packet Take 17 g by mouth daily. 11/17/23   Celine Mans, MD  senna-docusate (SENOKOT-S) 8.6-50 MG tablet Take 1 tablet by mouth at bedtime. 11/16/23   Celine Mans, MD  sucralfate (CARAFATE) 1 GM/10ML suspension Take 10 mLs (1 g total) by mouth 4 (four) times daily -  with meals and at bedtime. 11/16/23   Celine Mans, MD  TEGRETOL 200 MG tablet TAKE 1 AND 1/2 TABLETS BY MOUTH  TWICE DAILY IN THE MORNING AND  AT BEDTIME 08/07/23   Penumalli, Glenford Bayley, MD  topiramate (TOPAMAX) 25 MG tablet TAKE 3 TABLETS BY MOUTH TWICE  DAILY 08/16/23   Ihor Austin, NP    Significant Hospital Events: Including procedures, antibiotic start and stop dates in addition to other pertinent events   4/7 - admitted  Interim History / Subjective:  No further GTC.  Objective   Blood pressure (!) 134/94, pulse 95, temperature 97.9 F (36.6 C), resp. rate (!) 23, height 5\' 5"  (1.651 m), weight 71.5 kg, SpO2 100%.        Intake/Output Summary (  Last 24 hours) at 11/19/2023 1608 Last data filed at 11/19/2023 1400 Gross per 24 hour  Intake 300 ml  Output --  Net 300 ml   Filed Weights   11/19/23 1411  Weight: 71.5 kg    Examination: General: appears well nourished.  HENT: No tongue laceration Lungs: clear bilaterally Cardiovascular: HS normal  Abdomen: soft.  Extremities: no active joints.  Neuro: somnolent with sonorous breathing. Opens eyes to painful stimulation.  GU: Deferred  Continuous EEG shows frequent GPD's  Ancillary Tests Personally Reviewed:    Mild increase in creatinine to 1.15. Magnesium 1.5, potassium 3.4 Anemia with HB 7.0, WBC 5.3 VPA level <10, CBZ level down to 2.3 CXR shows atelectasis  Assessment & Plan:  Status epilepticus in patient with known epilepsy, likely due to poor intake and medication non-adherence given low drug levels.  No signs of intercurrent infection.  Cerebral palsy - reasonably functional at baseline but declining.  DNR/DNI per prior discussions.  Anemia - multifactorial, medication effects and poor nutrition.   Plan:  - Post-ictal state appears to be resolving despite 'active' EEG.  - Continue current AED's - May be sleepy from Keppra load.  - DNI/DNR - still protecting airway.  - complete nutritional work up for anemia. Ferritin normal last week.  Best Practice (right click and "Reselect all SmartList Selections" daily)   Diet/type: NPO DVT prophylaxis LMWH Pressure ulcer(s): N/A GI prophylaxis: N/A Lines: N/A Foley:  N/A Code Status:  DNR/DNI Last date of multidisciplinary goals of care discussion [pending.]  CRITICAL CARE Performed by: Lynnell Catalan   Total critical care time: 35 minutes  Critical care time was exclusive of separately billable procedures and treating other patients.  Critical care was necessary to treat or prevent imminent or life-threatening deterioration.  Critical care was time spent personally by me on the following activities: development of treatment plan with patient and/or surrogate as well as nursing, discussions with consultants, evaluation of patient's response to treatment, examination of patient, obtaining history from patient or surrogate, ordering and performing treatments and interventions, ordering and review of laboratory studies, ordering and review of radiographic studies, pulse oximetry, re-evaluation of patient's condition and participation in multidisciplinary rounds.  Lynnell Catalan, MD [facility] ICU Physician [facility] Critical Care   Pager: [phone] Mobile: [phone] After hours: [phone].

## 2023-11-20 ENCOUNTER — Inpatient Hospital Stay (HOSPITAL_COMMUNITY)

## 2023-11-20 DIAGNOSIS — Z91148 Patient's other noncompliance with medication regimen for other reason: Secondary | ICD-10-CM

## 2023-11-20 DIAGNOSIS — G40901 Epilepsy, unspecified, not intractable, with status epilepticus: Secondary | ICD-10-CM | POA: Diagnosis not present

## 2023-11-20 DIAGNOSIS — F79 Unspecified intellectual disabilities: Secondary | ICD-10-CM | POA: Diagnosis not present

## 2023-11-20 DIAGNOSIS — G809 Cerebral palsy, unspecified: Secondary | ICD-10-CM | POA: Diagnosis not present

## 2023-11-20 DIAGNOSIS — R4182 Altered mental status, unspecified: Secondary | ICD-10-CM | POA: Diagnosis not present

## 2023-11-20 DIAGNOSIS — D649 Anemia, unspecified: Secondary | ICD-10-CM | POA: Diagnosis not present

## 2023-11-20 LAB — CBC
HCT: 28.1 % — ABNORMAL LOW (ref 36.0–46.0)
Hemoglobin: 8.5 g/dL — ABNORMAL LOW (ref 12.0–15.0)
MCH: 30.9 pg (ref 26.0–34.0)
MCHC: 30.2 g/dL (ref 30.0–36.0)
MCV: 102.2 fL — ABNORMAL HIGH (ref 80.0–100.0)
Platelets: 391 10*3/uL (ref 150–400)
RBC: 2.75 MIL/uL — ABNORMAL LOW (ref 3.87–5.11)
RDW: 15.7 % — ABNORMAL HIGH (ref 11.5–15.5)
WBC: 8.7 10*3/uL (ref 4.0–10.5)
nRBC: 0 % (ref 0.0–0.2)

## 2023-11-20 LAB — GLUCOSE, CAPILLARY
Glucose-Capillary: 101 mg/dL — ABNORMAL HIGH (ref 70–99)
Glucose-Capillary: 109 mg/dL — ABNORMAL HIGH (ref 70–99)
Glucose-Capillary: 134 mg/dL — ABNORMAL HIGH (ref 70–99)
Glucose-Capillary: 142 mg/dL — ABNORMAL HIGH (ref 70–99)
Glucose-Capillary: 174 mg/dL — ABNORMAL HIGH (ref 70–99)
Glucose-Capillary: 202 mg/dL — ABNORMAL HIGH (ref 70–99)
Glucose-Capillary: 219 mg/dL — ABNORMAL HIGH (ref 70–99)

## 2023-11-20 LAB — BASIC METABOLIC PANEL WITH GFR
Anion gap: 10 (ref 5–15)
BUN: 6 mg/dL (ref 6–20)
CO2: 16 mmol/L — ABNORMAL LOW (ref 22–32)
Calcium: 8.1 mg/dL — ABNORMAL LOW (ref 8.9–10.3)
Chloride: 116 mmol/L — ABNORMAL HIGH (ref 98–111)
Creatinine, Ser: 0.93 mg/dL (ref 0.44–1.00)
GFR, Estimated: >60 mL/min (ref >60)
Glucose, Bld: 150 mg/dL — ABNORMAL HIGH (ref 70–99)
Potassium: 4.9 mmol/L (ref 3.5–5.1)
Sodium: 142 mmol/L (ref 135–145)

## 2023-11-20 LAB — MAGNESIUM: Magnesium: 2.1 mg/dL (ref 1.7–2.4)

## 2023-11-20 LAB — MRSA NEXT GEN BY PCR, NASAL: MRSA by PCR Next Gen: NOT DETECTED

## 2023-11-20 LAB — PHOSPHORUS: Phosphorus: 3.9 mg/dL (ref 2.5–4.6)

## 2023-11-20 LAB — RETICULOCYTES
Immature Retic Fract: 25.9 % — ABNORMAL HIGH (ref 2.3–15.9)
RBC.: 2.75 MIL/uL — ABNORMAL LOW (ref 3.87–5.11)
Retic Count, Absolute: 108.6 10*3/uL (ref 19.0–186.0)
Retic Ct Pct: 4 % — ABNORMAL HIGH (ref 0.4–3.1)

## 2023-11-20 LAB — FOLATE: Folate: 9.6 ng/mL (ref >5.9)

## 2023-11-20 LAB — VITAMIN B12: Vitamin B-12: 1026 pg/mL — ABNORMAL HIGH (ref 180–914)

## 2023-11-20 MED ORDER — PERAMPANEL 8 MG PO TABS
12.0000 mg | ORAL_TABLET | Freq: Once | ORAL | Status: AC
  Administered 2023-11-20: 12 mg via ORAL
  Filled 2023-11-20: qty 2

## 2023-11-20 MED ORDER — PHENYTOIN SODIUM 50 MG/ML IJ SOLN
100.0000 mg | Freq: Three times a day (TID) | INTRAMUSCULAR | Status: DC
  Administered 2023-11-20 – 2023-11-22 (×6): 100 mg via INTRAVENOUS
  Filled 2023-11-20 (×8): qty 2

## 2023-11-20 MED ORDER — SODIUM CHLORIDE 0.9 % IV SOLN
1500.0000 mg | Freq: Once | INTRAVENOUS | Status: AC
  Administered 2023-11-20: 1500 mg via INTRAVENOUS
  Filled 2023-11-20: qty 30

## 2023-11-20 NOTE — Progress Notes (Addendum)
 eLink Physician-Brief Progress Note Patient Name: Patricia White DOB: 06/19/1971 MRN: [id number]   Date of Service  11/20/2023  HPI/Events of Note  53 year old female with history of cerebral palsy with a fairly functional baseline and is DNR/DNI who presented to the emergency department earlier with status epilepticus in the setting of known epilepsy.  Continue to have right sided epileptiform changes with limited response to AEDs.  Continuous EEG monitoring in place.  Patient is somewhat tachypneic but otherwise normal vitals.  Saturating 99%.  On low-dose norepinephrine.  Results show macrocytic anemia, elevated creatinine, radiograph reviewed  eICU Interventions  Maintain AEDs per neurology, continuous EEG monitoring  Given tenuous respiratory status, nasopharyngeal airway available  Therapy is needed to maintain MAP greater than 65  DVT prophylaxis enoxaparin GI prophylaxis not indicated   0545 -offered an update to the patient's grandmother and legal guardian at bedside.  Maintain DNR for the time being.  Anticipating results from multidisciplinary discussions this morning.  Advised her that we may need to have goals of care discussion again later this morning.  All questions answered to her satisfaction.  Intervention Category Evaluation Type: New Patient Evaluation  Rejeana Fadness 11/20/2023, 12:16 AM

## 2023-11-20 NOTE — Progress Notes (Addendum)
 Subjective: More awake today.  Requested breakfast this morning and did eat some pancakes.  Grandmother at bedside states at baseline patient was awake, alert, able to walk around, eat her food independently except needing some help with shopping big pieces of meat, was able to have a conversation.  However she developed severe cough sometime in February and since then has been gradually worsening with loss of appetite.  Grandmother states she administers patient's medications twice daily.  Also states patient's left side has been her "bad site).  States her mother passed away long time ago due to liver cancer.  Outpatient neurology note from 06/27/2022: awake, alert, DECR FLUENCY, COMP INTACT. PLEASANT. MOTOR: LEFT ARM AND LEG ATROPHY, CONGENTIAL SPASTIC LEFT HEMIPARESIS; RIGHT SIDE 5/5. LUE (4), LLE (3-4).   ROS: Unable to obtain due to poor mental status  Examination  Vital signs in last 24 hours: Temp:  [97.3 F (36.3 C)-98.8 F (37.1 C)] 97.3 F (36.3 C) (04/08 1203) Pulse Rate:  [60-112] 87 (04/08 1200) Resp:  [8-33] 22 (04/08 1200) BP: (65-134)/(46-94) 107/65 (04/08 1200) SpO2:  [92 %-100 %] 99 % (04/08 1200) Weight:  [71.4 kg-71.5 kg] 71.4 kg (04/08 0000)  General: lying in bed, NAD Neuro: Opens eyes to tactile stimulation, able to tell me her name and follow simple commands, PERRLA, blinks to threat bilaterally, 3/5 in right upper and right lower extremity, withdraws to noxious stimuli left upper and left lower extremity  Basic Metabolic Panel: Recent Labs  Lab 11/14/23 0417 11/15/23 0341 11/16/23 0346 11/19/23 1345 11/20/23 0300  NA 135 133* 135 137 142  K 4.4 4.0 3.8 3.4* 4.9  CL 103 102 105 108 116*  CO2 26 23 22  19* 16*  GLUCOSE 119* 110* 92 101* 150*  BUN 15 13 10 7 6   CREATININE 0.87 0.91 0.84 1.15* 0.93  CALCIUM 8.5* 8.4* 8.5* 8.2* 8.1*  MG 1.9 1.6* 1.8 1.5* 2.1  PHOS 3.2 3.0 4.5 3.0 3.9    CBC: Recent Labs  Lab 11/14/23 0417 11/15/23 0341 11/16/23 0346  11/19/23 1345 11/19/23 1626 11/20/23 0507  WBC 4.2 3.7* 4.7 5.3 8.6 8.7  NEUTROABS 1.9  --   --  3.5  --   --   HGB 7.5* 7.5* 7.5* 7.0* 8.9* 8.5*  HCT 23.8* 23.9* 24.0* 23.4* 29.1* 28.1*  MCV 98.3 97.6 98.4 104.0* 102.5* 102.2*  PLT 137* 162 204 368 450* 391     Coagulation Studies: No results for input(s): "LABPROT", "INR" in the last 72 hours.  Imaging  MRI brain without contrast 03/03/2022: No acute infarct, mass effect or extra-axial collection. No acute or chronic hemorrhage. Normal white matter signal, parenchymal volume and CSF spaces. The midline structures are normal.    ASSESSMENT AND PLAN: 53 year old female with history of epilepsy, cerebral palsy, mental retardation, recent admission with failure to thrive who was brought in with status epilepticus.  Patient mental status has since improved but EEG continues to show electrographic status epilepticus  Convulsive status epilepticus, resolved Electrographic status epilepticus - Etiology of status epilepticus: Patient had low levels of Depakote and carbamazepine.  Grandmother states she has been giving patient medications.   Suspect there is some medication noncompliance (of note patient's grandmother is 47 herself).  About 1 year ago Depakote level was 72 and about 2 years ago carbamazepine level was 6.3  Recommendations - Patient's mental status is improving.  However EEG continues to show electrographic status epilepticus and therefore will load with perampanel 12 mg once - I  discussed patient's CODE STATUS with grandmother.  She states that if her condition is potentially reversible than she would like to switch her to full code again and is agreeable to intubation - Continue Keppra 1500 mg daily, Vimpat 200 mg twice daily, Dilantin 100 mg every 8 hours - Will also obtain MRI brain without contrast to look for any acute abnormality - Will check urinalysis if possible to look for UTI - If mental status or respiratory status  worsens, I discussed with ICU team to proceed with intubation.  At that point we will consider starting her on propofol and Versed and burst suppression  CRITICAL CARE Performed by: Charlsie Quest   Total critical care time: 38 minutes  Critical care time was exclusive of separately billable procedures and treating other patients.  Critical care was necessary to treat or prevent imminent or life-threatening deterioration.  Critical care was time spent personally by me on the following activities: development of treatment plan with patient and/or surrogate as well as nursing, discussions with consultants, evaluation of patient's response to treatment, examination of patient, obtaining history from patient or surrogate, ordering and performing treatments and interventions, ordering and review of laboratory studies, ordering and review of radiographic studies, pulse oximetry and re-evaluation of patient's condition.          Lindie Spruce Epilepsy [facility] For questions after 5pm please refer to AMION to reach the Neurologist on call

## 2023-11-20 NOTE — Progress Notes (Signed)
 OT Cancellation Note  Patient Details Name: Patricia White MRN: [id number] DOB: [birth year]   Cancelled Treatment:    Reason Eval/Treat Not Completed: Medical issues which prohibited therapy Ongoing seizures and continued EEG monitoring. Will hold OT eval attempts and follow up at a later date.  Lorre Munroe 11/20/2023, 8:10 AM

## 2023-11-20 NOTE — Progress Notes (Signed)
 PT Cancellation Note  Patient Details Name: Patricia White MRN: [id number] DOB: [birth year]   Cancelled Treatment:    Reason Eval/Treat Not Completed: Patient not medically ready (pt on continuous EEG with continued Sz)   Ethanjames Fontenot B Lilu Mcglown 11/20/2023, 8:26 AM Merryl Hacker, PT Acute Rehabilitation Services Office: [phone]

## 2023-11-20 NOTE — Progress Notes (Signed)
 NAME:  Patricia White, MRN:  [id number], DOB:  1970-10-17, LOS: 1 ADMISSION DATE:  11/19/2023, CONSULTATION DATE:  11/19/2023 REFERRING MD:  Theresia Lo - ED [facility], CHIEF COMPLAINT:  status epilepticus.    History of Present Illness:  53 year old woman who suffered 4 witnessed GTC's without full return of LOC.   Has CP and was recently discharged for FTT with poor oral intake. Loaded with Keppra, home medications continued.  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 Authorizing Provider  acetaminophen (TYLENOL) 325 MG tablet Take 2 tablets (650 mg total) by mouth every 6 (six) hours as needed for mild pain (pain score 1-3) (or Fever >/= 101). 11/16/23   Celine Mans, MD  atorvastatin (LIPITOR) 40 MG tablet TAKE 1 TABLET BY MOUTH DAILY Patient taking differently: Take 40 mg by mouth in the morning. 07/30/23   Celine Mans, MD  divalproex (DEPAKOTE) 250 MG DR tablet Take 2 tablets (500 mg total) by mouth 2 (two) times daily. **PATIENT NEEDS AN OFFICE VISIT FOR FUTURE Rx REFILLS** 08/01/23   Ihor Austin, NP  famotidine (PEPCID) 20 MG tablet Take 1 tablet (20 mg total) by mouth 2 (two) times daily for 14 days. 07/17/23 11/01/23  Celine Mans, MD  levocetirizine (XYZAL) 5 MG tablet TAKE 1 TABLET BY MOUTH 2 TIMES DAILY 03/17/21   Marcelyn Bruins, MD  Nutritional Supplements  (,FEEDING SUPPLEMENT, PROSOURCE PLUS) liquid Take 30 mLs by mouth 2 (two) times daily between meals. 11/16/23   Celine Mans, MD  nystatin (MYCOSTATIN) 100000 UNIT/ML suspension Take 5 mLs (500,000 Units total) by mouth 4 (four) times daily for 5 days. 11/16/23 11/21/23  Celine Mans, MD  pantoprazole (PROTONIX) 40 MG tablet Take 1 tablet (40 mg total) by mouth daily. 11/17/23   Celine Mans, MD  polyethylene glycol (MIRALAX / GLYCOLAX) 17 g packet Take 17 g by mouth daily. 11/17/23   Celine Mans, MD  senna-docusate (SENOKOT-S) 8.6-50 MG tablet Take 1 tablet by mouth at bedtime. 11/16/23   Celine Mans, MD  sucralfate (CARAFATE) 1 GM/10ML suspension Take 10 mLs (1 g total) by mouth 4 (four) times daily -  with meals and at bedtime. 11/16/23   Celine Mans, MD  TEGRETOL 200 MG tablet TAKE 1 AND 1/2 TABLETS BY MOUTH  TWICE DAILY IN THE MORNING AND  AT BEDTIME 08/07/23   Penumalli, Glenford Bayley, MD  topiramate (TOPAMAX) 25 MG tablet TAKE 3 TABLETS BY MOUTH TWICE  DAILY 08/16/23   Ihor Austin, NP    Significant Hospital Events: Including procedures, antibiotic start and stop dates in addition to other pertinent events   4/7 - admitted  Interim History / Subjective:  Intermittently less responsive with possible seizure activity overnight.   Objective   Blood pressure 117/73, pulse 82, temperature 97.6 F (36.4 C), temperature source Axillary, resp. rate 15, height 5\' 5"  (1.651 m), weight 71.4 kg, SpO2 100%.  Intake/Output Summary (Last 24 hours) at 11/20/2023 1051 Last data filed at 11/20/2023 0900 Gross per 24 hour  Intake 4784.74 ml  Output 800 ml  Net 3984.74 ml   Filed Weights   11/19/23 1411 11/20/23 0000  Weight: 71.5 kg 71.4 kg    Examination: General: appears well nourished.  HENT: No tongue laceration Lungs: clear bilaterally Cardiovascular: HS normal  Abdomen: soft.  Extremities: no active joints.  Neuro: somnolent but will awakes easily and follows commands.  Asking to eat. Using right side purposefully. Still not much spontaneous movement on weaker left side.  GU: Deferred  Continuous EEG shows burst suppression pattern.   Ancillary Tests Personally Reviewed:   Creatinine back to normal 0.93 Anemia with HB 8.5 WBC 8.7. increase reticulocyte count VPA level <10, CBZ level down to 2.3 on admission Normal B12, folate Assessment & Plan:  Status epilepticus in patient with known epilepsy, likely due to poor intake and medication non-adherence given low drug levels.  No signs of intercurrent infection.  Cerebral palsy - reasonably functional at baseline. May have been in status for some time and might explain recent declin Anemia with high reticulocyte count, normal nutritional indices  Plan:  - Suspect patient has been in partial status for some time. -Continuing current anticonvulsants and adding perampanel.  May also take time for other agents to reach steady state. -Grandmother is okay with short-term intubation if necessary for seizure control.  Remains a DNR.  Best Practice (right click and "Reselect all SmartList Selections" daily)   Diet/type: Regular diet. DVT prophylaxis LMWH Pressure ulcer(s): N/A GI prophylaxis: N/A Lines: N/A Foley:  N/A Code Status:  DNR/okay with intubation. Last date of multidisciplinary goals of care discussion [grandmother updated at the bedside..]  CRITICAL CARE Performed by: Lynnell Catalan   Total critical care time: 35 minutes  Critical care time was exclusive of separately billable procedures and treating other patients.  Critical care was necessary to treat or prevent imminent or life-threatening deterioration.  Critical care was time spent personally by me on the following activities: development of treatment plan with patient and/or surrogate as well as nursing, discussions with consultants, evaluation of patient's response to treatment, examination of patient, obtaining history from patient  or surrogate, ordering and performing treatments and interventions, ordering and review of laboratory studies, ordering and review of radiographic studies, pulse oximetry, re-evaluation of patient's condition and participation in multidisciplinary rounds.  Lynnell Catalan, MD [facility] ICU Physician [facility] Critical Care  Pager: [phone] Mobile: [phone] After hours: [phone].

## 2023-11-20 NOTE — Progress Notes (Signed)
 Leads off for MRI, pt is to go back on after

## 2023-11-20 NOTE — Progress Notes (Addendum)
 SLP Cancellation Note  Patient Details Name: NAZIFA TRINKA MRN: [id number] DOB: [birth year]   Cancelled treatment:        Pt with CP and some baseline deficits, also with ongoing seizure activity at this time.  Cognitive-linguistic assessment not needed at this time. Spoke with MD and SLP will sign off.  If evaluation is indicated after seizures are well controled, please re-consult.     Kerrie Pleasure, MA, CCC-SLP Acute Rehabilitation Services Office: [phone] 11/20/2023, 8:11 AM

## 2023-11-20 NOTE — Progress Notes (Addendum)
 LTM maint complete - no skin breakdown seen.   Study moved to inpatient setting from ER.  Study up and running. [facility] monitored, Event button test confirmed by [facility].

## 2023-11-20 NOTE — Code Documentation (Signed)
 I discussed patient's CODE STATUS with patient's grandmother at bedside (patient's grandmother passed away many years ago and and patient has been living with grandmother).  Per grandmother, if potentially reversible, she would like patient to be intubated and resuscitated if needed.   Therefore I will update her CODE STATUS.  I discussed the change in CODE STATUS with Dr. Denese Killings as well

## 2023-11-20 NOTE — TOC Initial Note (Addendum)
 Transition of Care [facility]) - Initial/Assessment Note    Patient Details  Name: Patricia White MRN: [id number] Date of Birth: 05-Oct-1970  Transition of Care [facility]) CM/SW Contact:    Lawerance Sabal, RN Phone Number: 11/20/2023, 8:53 AM  Clinical Narrative:                  Patient admitted from home. Recently DC'd with [facility] services through [facility], requested liaison to check if patient is still active. (Confirmed w liaison that patient is still active w [facility])  Currently in ICU, will need to see how patient progresses to determine DC needs. TOC following.   14:40 Spoke w grandmother [name] in room, deferred to her Aunt Deloris. She confirms they have home health services with [facility]. Patient has RW at home, they declined need for additional DME, could not identify anything or extra support they would need at home to prevent readmission. She would like to go home at DC and resume w [facility]. She states they will transport by private car.   Expected Discharge Plan:  (TBD, will see how patient progesses, currently in ICU) Barriers to Discharge: Continued Medical Work up   Patient Goals and CMS Choice            Expected Discharge Plan and Services   Discharge Planning Services: CM Consult   Living arrangements for the past 2 months: Single Family Home                                      Prior Living Arrangements/Services Living arrangements for the past 2 months: Single Family Home Lives with:: Relatives (Aunt Deloris)              Current home services: DME (RW)    Activities of Daily Living   ADL Screening (condition at time of admission) Independently performs ADLs?: Yes (appropriate for developmental age) Is the patient deaf or have difficulty hearing?: No Does the patient have difficulty seeing, even when wearing glasses/contacts?: No Does the patient have difficulty concentrating, remembering, or making decisions?: No  Permission Sought/Granted                   Emotional Assessment              Admission diagnosis:  Status epilepticus (HCC) [G40.901] Shock (HCC) [R57.9] Patient Active Problem List   Diagnosis Date Noted   Status epilepticus (HCC) 11/19/2023   Pancytopenia (HCC) 11/11/2023   Hypomagnesemia 11/09/2023   Dehydration 11/08/2023   Gastritis and gastroduodenitis 11/08/2023   Gastric polyp 11/08/2023   Nausea without vomiting 11/08/2023   Protein-calorie malnutrition, severe 11/08/2023   Decreased appetite 11/05/2023   Lower urinary tract infectious disease 11/01/2023   Chronic health problem 11/01/2023   Oral thrush 11/01/2023   FTT (failure to thrive) in adult 11/01/2023   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   Hot flashes due to menopause 06/30/2021   Hyperlipidemia associated with type 2 diabetes mellitus (HCC) 11/24/2020   Diabetes mellitus without complication (HCC) 11/23/2020   Angioedema 12/10/2019   Lesion of mandible 12/10/2019   Seasonal allergies 11/20/2019   Essential hypertension 10/23/2019   Seizure disorder (HCC) 02/23/2016   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   Obesity (BMI 30.0-34.9) 10/13/2009   Infantile cerebral palsy (HCC) 10/11/2006   PCP:  Celine Mans, MD Pharmacy:   [facility] - [place], [place] - [address] [place] [address] Phone: [phone] Fax: [phone]  [facility] - [place], [place] - [address] [place] [address] Phone: [phone] Fax: [phone]  [facility] [phone] [place], [place] - [address] [place] [address] Phone: [phone] Fax: [phone]     Social Drivers of Health (SDOH) Social History: SDOH Screenings   Food Insecurity: No Food Insecurity (11/19/2023)  Housing: Low Risk  (11/19/2023)  Transportation Needs: No Transportation Needs  (11/19/2023)  Utilities: Not At Risk (11/19/2023)  Alcohol Screen: Low Risk  (03/24/2023)  Depression (PHQ2-9): Low Risk  (08/09/2023)  Financial Resource Strain: Low Risk  (03/24/2023)  Physical Activity: Insufficiently Active (03/24/2023)  Social Connections: Moderately Isolated (11/19/2023)  Stress: No Stress Concern Present (03/24/2023)  Tobacco Use: Low Risk  (11/19/2023)  Health Literacy: Adequate Health Literacy (03/24/2023)   SDOH Interventions:     Readmission Risk Interventions    11/16/2023   11:48 AM  Readmission Risk Prevention Plan  Transportation Screening Complete  PCP or Specialist Appt within 5-7 Days Complete  Home Care Screening Complete  Medication Review (RN CM) Complete

## 2023-11-20 NOTE — Procedures (Signed)
 Patient Name: Patricia White  MRN: [id number]  Epilepsy Attending: Charlsie Quest  Referring Physician/Provider: Lynnae January, NP  Duration: 11/19/2023 1527 to 11/20/2023 1523  Patient history: 53 y.o. female with hx of seizures, cerebral palsy, mental retardation, nonverbal, failure to thrive, dehydration, gastritis/gastroduodenitis, severe malnutrition who was brought in by EMS after having 4 witnessed generalized tonic-clonic seizures. EEG to evaluate for seizure  Level of alertness:  comatose/ lethargic   AEDs during EEG study: LEV, LCM, PHT, VPA  Technical aspects: This EEG study was done with scalp electrodes positioned according to the 10-20 International system of electrode placement. Electrical activity was reviewed with band pass filter of 1-70Hz , sensitivity of 7 uV/mm, display speed of 23mm/sec with a 60Hz  notched filter applied as appropriate. EEG data were recorded continuously and digitally stored.  Video monitoring was available and reviewed as appropriate.  Description: EEG showed continuous generalized and maximal right frontoparietal region highly epileptiform bursts which appear periodic at 1 to 1.5 Hz alternating with 0.5 to 1-second of generalized EEG attenuation.  At times per staff patient was noted to have tremor-like activity but otherwise patient was encephalopathic and no other clinical signs were noted. This EEG pattern is concerning for electrographic status epilepticus.  Hyperventilation and photic stimulation were not performed.     ABNORMALITY - Electrographic status epilepticus, generalized and maximal right frontoparietal region  IMPRESSION: This study showed evidence of electrographic status epilepticus with generalized and maximal right frontoparietal onset. Additionally there was severe diffuse encephalopathy.  Dr Amada Jupiter notified on 11/19/2023 at 1556 and Dr Derry Lory notified again at 1920.   Allison Silva Annabelle Harman

## 2023-11-21 ENCOUNTER — Inpatient Hospital Stay: Payer: Self-pay

## 2023-11-21 ENCOUNTER — Encounter (HOSPITAL_COMMUNITY)

## 2023-11-21 DIAGNOSIS — G809 Cerebral palsy, unspecified: Secondary | ICD-10-CM | POA: Diagnosis not present

## 2023-11-21 DIAGNOSIS — F79 Unspecified intellectual disabilities: Secondary | ICD-10-CM | POA: Diagnosis not present

## 2023-11-21 DIAGNOSIS — R4182 Altered mental status, unspecified: Secondary | ICD-10-CM | POA: Diagnosis not present

## 2023-11-21 DIAGNOSIS — G40901 Epilepsy, unspecified, not intractable, with status epilepticus: Secondary | ICD-10-CM | POA: Diagnosis not present

## 2023-11-21 LAB — GLUCOSE, CAPILLARY
Glucose-Capillary: 63 mg/dL — ABNORMAL LOW (ref 70–99)
Glucose-Capillary: 177 mg/dL — ABNORMAL HIGH (ref 70–99)
Glucose-Capillary: 65 mg/dL — ABNORMAL LOW (ref 70–99)
Glucose-Capillary: 45 mg/dL — ABNORMAL LOW (ref 70–99)
Glucose-Capillary: 199 mg/dL — ABNORMAL HIGH (ref 70–99)
Glucose-Capillary: 140 mg/dL — ABNORMAL HIGH (ref 70–99)
Glucose-Capillary: 76 mg/dL (ref 70–99)
Glucose-Capillary: 111 mg/dL — ABNORMAL HIGH (ref 70–99)
Glucose-Capillary: 60 mg/dL — ABNORMAL LOW (ref 70–99)

## 2023-11-21 MED ORDER — DEXTROSE 50 % IV SOLN
INTRAVENOUS | Status: AC
  Administered 2023-11-21: 12.5 g via INTRAVENOUS
  Filled 2023-11-21: qty 50

## 2023-11-21 MED ORDER — ENSURE ENLIVE PO LIQD
237.0000 mL | Freq: Two times a day (BID) | ORAL | Status: DC
  Administered 2023-11-22 – 2023-11-25 (×4): 237 mL via ORAL

## 2023-11-21 MED ORDER — DEXTROSE 50 % IV SOLN: 12.5000 g | INTRAVENOUS | Status: AC

## 2023-11-21 MED ORDER — CLONAZEPAM 0.5 MG PO TABS
0.5000 mg | ORAL_TABLET | Freq: Three times a day (TID) | ORAL | Status: DC
  Administered 2023-11-21 – 2023-11-26 (×16): 0.5 mg via ORAL
  Filled 2023-11-21 (×16): qty 1

## 2023-11-21 MED ORDER — PERAMPANEL 2 MG PO TABS
4.0000 mg | ORAL_TABLET | Freq: Every day | ORAL | Status: DC
  Administered 2023-11-21 – 2023-11-26 (×6): 4 mg via ORAL
  Filled 2023-11-21 (×6): qty 2

## 2023-11-21 MED ORDER — DEXTROSE 50 % IV SOLN
INTRAVENOUS | Status: AC
  Administered 2023-11-21: 50 mL
  Filled 2023-11-21: qty 50

## 2023-11-21 MED ORDER — DEXTROSE IN LACTATED RINGERS 5 % IV SOLN: INTRAVENOUS | Status: DC

## 2023-11-21 NOTE — Progress Notes (Signed)
 LTM maintenance performed. No skin break down under A2, T6 and Fp1.

## 2023-11-21 NOTE — Procedures (Signed)
 Patient Name: Patricia White  MRN: [id number]  Epilepsy Attending: Charlsie Quest  Referring Physician/Provider: Lynnae January, NP  Duration: 11/20/2023 1904 to 11/21/2023 1904   Patient history: 53 y.o. female with hx of seizures, cerebral palsy, mental retardation, nonverbal, failure to thrive, dehydration, gastritis/gastroduodenitis, severe malnutrition who was brought in by EMS after having 4 witnessed generalized tonic-clonic seizures. EEG to evaluate for seizure   Level of alertness: awake, asleep    AEDs during EEG study: LEV, LCM, PHT, Perampanel   Technical aspects: This EEG study was done with scalp electrodes positioned according to the 10-20 International system of electrode placement. Electrical activity was reviewed with band pass filter of 1-70Hz , sensitivity of 7 uV/mm, display speed of 67mm/sec with a 60Hz  notched filter applied as appropriate. EEG data were recorded continuously and digitally stored.  Video monitoring was available and reviewed as appropriate.   Description: The posterior dominant rhythm consists of 8 Hz activity of moderate voltage (25-35 uV) seen predominantly in posterior head regions, symmetric and reactive to eye opening and eye closing. Sleep was characterized by vertex waves, sleep spindles (12 to 14 Hz), maximal frontocentral region.   EEG showed near continuous highly epileptiform bursts which appear periodic at 1 to 1.5 Hz alternating with 0.5 to 1-second of generalized EEG attenuation in right frontoparietal region. No clinical signs were noted. This EEG pattern is concerning for electrographic status epilepticus arising from  right frontoparietal region.   Hyperventilation and photic stimulation were not performed.      ABNORMALITY - Electrographic status epilepticus, right frontoparietal region   IMPRESSION: This study showed evidence of electrographic status epilepticus arising from right frontoparietal onset.  EEG has improved compared to  previous day.   Janiya Millirons Annabelle Harman

## 2023-11-21 NOTE — Evaluation (Signed)
 Occupational Therapy Evaluation Patient Details Name: Patricia White MRN: [id number] DOB: Apr 16, 1971 Today's Date: 11/21/2023   History of Present Illness   Pt is a 53 y/o F presenting to ED on 4/7 with status epilepticus. PMH includes cerebral palsy, GERD, seizures on depatoke, HTN, HLD     Clinical Impressions Pt has assist at baseline with ADLs, and does not use AD for mobility per family reports. Pt currently with minimal verbalizations this session, able to sit EOB  with mod +2 -- declined further mobility. Pt needing up to max A for ADLs, pt with baseline L hemi. Pt presenting with impairments listed below, will follow acutely. Recommend HHOT at d/c if family can provide level of assist needed, if unable will benefit from postacute rehab.     If plan is discharge home, recommend the following:   Assistance with cooking/housework;Supervision due to cognitive status;Direct supervision/assist for financial management;Direct supervision/assist for medications management;A lot of help with walking and/or transfers;A lot of help with bathing/dressing/bathroom     Functional Status Assessment   Patient has had a recent decline in their functional status and demonstrates the ability to make significant improvements in function in a reasonable and predictable amount of time.     Equipment Recommendations   None recommended by OT     Recommendations for Other Services   PT consult     Precautions/Restrictions   Precautions Precautions: Fall Precaution/Restrictions Comments: L hemiparesis (congenital CP) Restrictions Weight Bearing Restrictions Per Provider Order: No     Mobility Bed Mobility Overal bed mobility: Needs Assistance Bed Mobility: Supine to Sit, Sit to Supine   Sidelying to sit: Mod assist, +2 for physical assistance   Sit to supine: Mod assist, +2 for physical assistance        Transfers                   General transfer comment: pt  declines      Balance Overall balance assessment: Needs assistance Sitting-balance support: Feet supported, No upper extremity supported Sitting balance-Leahy Scale: Fair Sitting balance - Comments: supported sitting at EOB                                   ADL either performed or assessed with clinical judgement   ADL Overall ADL's : Needs assistance/impaired Eating/Feeding: Set up;Bed level   Grooming: Set up;Bed level   Upper Body Bathing: Moderate assistance;Sitting   Lower Body Bathing: Maximal assistance;Sitting/lateral leans   Upper Body Dressing : Moderate assistance;Sitting   Lower Body Dressing: Maximal assistance;Sitting/lateral leans   Toilet Transfer: Minimal assistance;Moderate assistance   Toileting- Clothing Manipulation and Hygiene: Maximal assistance       Functional mobility during ADLs: Minimal assistance;Moderate assistance       Vision   Additional Comments: will further assess     Perception Perception: Not tested       Praxis Praxis: Not tested       Pertinent Vitals/Pain Pain Assessment Pain Assessment: Faces Pain Score: 4  Faces Pain Scale: Hurts little more Pain Location: generalized iwth movement Pain Descriptors / Indicators: Grimacing Pain Intervention(s): Limited activity within patient's tolerance, Monitored during session, Repositioned     Extremity/Trunk Assessment Upper Extremity Assessment Upper Extremity Assessment: Generalized weakness LUE Deficits / Details: Congential CP, L hemi,   Lower Extremity Assessment Lower Extremity Assessment: Defer to PT evaluation   Cervical / Trunk Assessment Cervical /  Trunk Assessment: Normal   Communication Communication Communication: Impaired   Cognition Arousal: Alert Behavior During Therapy: Flat affect, Impulsive Cognition: History of cognitive impairments             OT - Cognition Comments: 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, required  encouragement from family for OOB activity                 Following commands: Impaired Following commands impaired: Follows one step commands inconsistently     Cueing  General Comments   Cueing Techniques: Verbal cues;Gestural cues;Tactile cues;Visual cues  VSS   Exercises     Shoulder Instructions      Home Living Family/patient expects to be discharged to:: Private residence Living Arrangements: Other relatives Available Help at Discharge: Available 24 hours/day;Family Type of Home: House Home Access: Stairs to enter [employer] of Steps: 4 Entrance Stairs-Rails: None Home Layout: One level     Bathroom Shower/Tub: Tub/shower unit;Sponge bathes at baseline   [employer]: Standard     Home Equipment: Shower seat          Prior Functioning/Environment Prior Level of Function : History of Falls (last six months)             Mobility Comments: Ind no AD ADLs Comments: ind with ADLs, Aunt does iADLs and medication management, does not get in shower to bathe    OT Problem List: Impaired balance (sitting and/or standing);Decreased cognition;Decreased strength;Pain   OT Treatment/Interventions: Self-care/ADL training;Balance training;Therapeutic exercise;Therapeutic activities;Patient/family education      OT Goals(Current goals can be found in the care plan section)   Acute Rehab OT Goals Patient Stated Goal: none stated OT Goal Formulation: With family Time For Goal Achievement: 12/05/23 Potential to Achieve Goals: Good ADL Goals Pt Will Perform Upper Body Dressing: with supervision;sitting Pt Will Perform Lower Body Dressing: with supervision;sitting/lateral leans;sit to/from stand Pt Will Transfer to Toilet: with supervision;ambulating;regular height toilet   OT Frequency:  Min 1X/week    Co-evaluation PT/OT/SLP Co-Evaluation/Treatment: Yes Reason for Co-Treatment: To address functional/ADL transfers;For patient/therapist  safety PT goals addressed during session: Mobility/safety with mobility;Strengthening/ROM;Balance OT goals addressed during session: ADL's and self-care;Strengthening/ROM      AM-PAC OT "6 Clicks" Daily Activity     Outcome Measure Help from another person eating meals?: A Little Help from another person taking care of personal grooming?: A Little Help from another person toileting, which includes using toliet, bedpan, or urinal?: A Lot Help from another person bathing (including washing, rinsing, drying)?: A Lot Help from another person to put on and taking off regular upper body clothing?: A Lot Help from another person to put on and taking off regular lower body clothing?: A Lot 6 Click Score: 14   End of Session Nurse Communication: Mobility status  Activity Tolerance: Patient limited by fatigue (closes eyes, limited communication throughout session) Patient left: in bed;with call bell/phone within reach;with bed alarm set;with family/visitor present  OT Visit Diagnosis: Unsteadiness on feet (R26.81);History of falling (Z91.81);Muscle weakness (generalized) (M62.81)                Time: 1610-9604 OT Time Calculation (min): 13 min Charges:  OT General Charges $OT Visit: 1 Visit OT Evaluation $OT Eval Low Complexity: 1 Low  Huxton Glaus K, OTD, OTR/L SecureChat Preferred Acute Rehab [phone]   Enriqueta Augusta K Koonce 11/21/2023, 11:47 AM

## 2023-11-21 NOTE — Progress Notes (Addendum)
 Subjective: No acute events overnight.  Aunt at bedside today.  Patient had some breakfast and worked with PT.  ROS: Unable to obtain due to poor mental status  Examination  Vital signs in last 24 hours: Temp:  [97.3 F (36.3 C)-98.4 F (36.9 C)] 97.6 F (36.4 C) (04/09 0714) Pulse Rate:  [82-119] 90 (04/09 0815) Resp:  [14-24] 14 (04/09 0815) BP: (82-115)/(63-91) 90/70 (04/09 0815) SpO2:  [93 %-100 %] 100 % (04/09 0815)  General: lying in bed, NAD Neuro: Awake, alert, able to tell me her name and that she is at the hospital, when asked about month, states may and then states that is when her birthday is, follows commands, antigravity strength in all 4 extremities with left hemiparesis (baseline)  Basic Metabolic Panel: Recent Labs  Lab 11/15/23 0341 11/16/23 0346 11/19/23 1345 11/20/23 0300  NA 133* 135 137 142  K 4.0 3.8 3.4* 4.9  CL 102 105 108 116*  CO2 23 22 19* 16*  GLUCOSE 110* 92 101* 150*  BUN 13 10 7 6   CREATININE 0.91 0.84 1.15* 0.93  CALCIUM 8.4* 8.5* 8.2* 8.1*  MG 1.6* 1.8 1.5* 2.1  PHOS 3.0 4.5 3.0 3.9    CBC: Recent Labs  Lab 11/15/23 0341 11/16/23 0346 11/19/23 1345 11/19/23 1626 11/20/23 0507  WBC 3.7* 4.7 5.3 8.6 8.7  NEUTROABS  --   --  3.5  --   --   HGB 7.5* 7.5* 7.0* 8.9* 8.5*  HCT 23.9* 24.0* 23.4* 29.1* 28.1*  MCV 97.6 98.4 104.0* 102.5* 102.2*  PLT 162 204 368 450* 391     Coagulation Studies: No results for input(s): "LABPROT", "INR" in the last 72 hours.  Imaging personally reviewed  MRI brain without contrast/03/2024: Limited study did not show any evidence of restricted diffusion or seizure related changes  ASSESSMENT AND PLAN:53 year old female with history of epilepsy, cerebral palsy, mental retardation, recent admission with failure to thrive who was brought in with status epilepticus.  Patient mental status has since improved but EEG continues to show electrographic status epilepticus   Convulsive status epilepticus,  resolved Electrographic status epilepticus - Etiology of status epilepticus: Patient had low levels of Depakote and carbamazepine.  Grandmother states she has been giving patient medications.   Suspect there is some medication noncompliance (of note patient's grandmother is 11 herself).  About 1 year ago Depakote level was 72 and about 2 years ago carbamazepine level was 6.3   Recommendations - Patient's mental status is improving and EEG also improved.  Now appearing more focal rather than generalized status epilepticus - Continue Keppra 1500 mg daily, Vimpat 200 mg twice daily, Dilantin 100 mg every 8 hours.  Will check Dilantin levels -Start maintenance perampanel 4 mg daily - Also start clonazepam 0.5 mg 3 times daily - If mental status or respiratory status worsens, I discussed with ICU team to proceed with intubation.  At that point we will consider starting her on propofol and Versed and burst suppression   I have spent a total of  36  minutes with the patient reviewing hospital notes,  test results, labs and examining the patient as well as establishing an assessment and plan > 50% of time was spent in direct patient care.        Lindie Spruce Epilepsy [facility] For questions after 5pm please refer to AMION to reach the Neurologist on call

## 2023-11-21 NOTE — Progress Notes (Signed)
 NAME:  Patricia White, MRN:  [id number], DOB:  03-10-1971, LOS: 2 ADMISSION DATE:  11/19/2023, CONSULTATION DATE:  11/19/2023 REFERRING MD:  Theresia Lo - ED [facility], CHIEF COMPLAINT:  status epilepticus.    History of Present Illness:  53 year old woman who suffered 4 witnessed GTC's without full return of LOC.   Has CP and was recently discharged for FTT with poor oral intake. Loaded with Keppra, home medications continued.  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 Authorizing Provider  acetaminophen (TYLENOL) 325 MG tablet Take 2 tablets (650 mg total) by mouth every 6 (six) hours as needed for mild pain (pain score 1-3) (or Fever >/= 101). 11/16/23   Celine Mans, MD  atorvastatin (LIPITOR) 40 MG tablet TAKE 1 TABLET BY MOUTH DAILY Patient taking differently: Take 40 mg by mouth in the morning. 07/30/23   Celine Mans, MD  divalproex (DEPAKOTE) 250 MG DR tablet Take 2 tablets (500 mg total) by mouth 2 (two) times daily. **PATIENT NEEDS AN OFFICE VISIT FOR FUTURE Rx REFILLS** 08/01/23   Ihor Austin, NP  famotidine (PEPCID) 20 MG tablet Take 1 tablet (20 mg total) by mouth 2 (two) times daily for 14 days. 07/17/23 11/01/23  Celine Mans, MD  levocetirizine (XYZAL) 5 MG tablet TAKE 1 TABLET BY MOUTH 2 TIMES DAILY 03/17/21   Marcelyn Bruins, MD  Nutritional Supplements  (,FEEDING SUPPLEMENT, PROSOURCE PLUS) liquid Take 30 mLs by mouth 2 (two) times daily between meals. 11/16/23   Celine Mans, MD  nystatin (MYCOSTATIN) 100000 UNIT/ML suspension Take 5 mLs (500,000 Units total) by mouth 4 (four) times daily for 5 days. 11/16/23 11/21/23  Celine Mans, MD  pantoprazole (PROTONIX) 40 MG tablet Take 1 tablet (40 mg total) by mouth daily. 11/17/23   Celine Mans, MD  polyethylene glycol (MIRALAX / GLYCOLAX) 17 g packet Take 17 g by mouth daily. 11/17/23   Celine Mans, MD  senna-docusate (SENOKOT-S) 8.6-50 MG tablet Take 1 tablet by mouth at bedtime. 11/16/23   Celine Mans, MD  sucralfate (CARAFATE) 1 GM/10ML suspension Take 10 mLs (1 g total) by mouth 4 (four) times daily -  with meals and at bedtime. 11/16/23   Celine Mans, MD  TEGRETOL 200 MG tablet TAKE 1 AND 1/2 TABLETS BY MOUTH  TWICE DAILY IN THE MORNING AND  AT BEDTIME 08/07/23   Penumalli, Glenford Bayley, MD  topiramate (TOPAMAX) 25 MG tablet TAKE 3 TABLETS BY MOUTH TWICE  DAILY 08/16/23   Ihor Austin, NP    Significant Hospital Events: Including procedures, antibiotic start and stop dates in addition to other pertinent events   4/7 - admitted 4/8 - EEG shows status with partial impairment of mental status.   Interim History / Subjective:  More consistently awake, Hungry and able to eat.   Objective   Blood pressure 112/75, pulse 98, temperature (!) 97.5 F (36.4 C), temperature source Axillary, resp. rate  18, height 5\' 5"  (1.651 m), weight 71.4 kg, SpO2 100%.        Intake/Output Summary (Last 24 hours) at 11/21/2023 1359 Last data filed at 11/21/2023 1200 Gross per 24 hour  Intake 319.03 ml  Output 2375 ml  Net -2055.97 ml   Filed Weights   11/19/23 1411 11/20/23 0000  Weight: 71.5 kg 71.4 kg   Examination: General: appears well nourished.  HENT: No tongue laceration Lungs: clear bilaterally Cardiovascular: HS normal  Abdomen: soft.  Extremities: no active joints.  Neuro: More awake  and using right side purposefully.  GU: Deferred  Continuous EEG shows more localized seizure activity  Ancillary Tests Personally Reviewed:   MRI shows no structural abnormalities.   Assessment & Plan:  Status epilepticus in patient with known epilepsy, likely due to poor intake and medication non-adherence given low drug levels.  No signs of intercurrent infection.  Cerebral palsy - reasonably functional at baseline. May have been in status for some time and might explain recent declin Anemia with high reticulocyte count, normal nutritional indices  Plan:  - Suspect patient has been in partial status for some time. Appetite better now that she is more awake.  -Now on 5 AED's. Clonazepam added today and will observe today in ICU today to ensure she is not overly sedated.  -Grandmother is okay with short-term intubation if necessary for seizure control.  Remains a DNR.  Best Practice (right click and "Reselect all SmartList Selections" daily)   Diet/type: Regular diet. DVT prophylaxis LMWH Pressure ulcer(s): N/A GI prophylaxis: N/A Lines: N/A Foley:  N/A Code Status:  DNR/okay with intubation. Last date of multidisciplinary goals of care discussion [grandmother updated at the bedside..]  Lynnell Catalan, MD [facility] ICU Physician [facility] Critical Care  Pager: [phone] Mobile: [phone] After hours: [phone].

## 2023-11-21 NOTE — Progress Notes (Signed)
 eLink Physician-Brief Progress Note Patient Name: Patricia White DOB: 1971-04-30 MRN: [id number]   Date of Service  11/21/2023  HPI/Events of Note  Patient with low blood sugar and soft blood pressures in the context of poor oral intake.  eICU Interventions  D5 LR gtt started at 75 ml / hour.        Patricia White 11/21/2023, 9:22 PM

## 2023-11-21 NOTE — Evaluation (Signed)
 Physical Therapy Evaluation Patient Details Name: Patricia White MRN: [id number] DOB: [birth year] Today's Date: 11/21/2023  History of Present Illness  Pt is a 53 y/o F presenting to ED on 4/7 with status epilepticus. PMH includes cerebral palsy, GERD, seizures on depatoke, HTN, HLD  Clinical Impression  Pt admitted with above diagnosis and presents to PT with functional limitations due to deficits listed below (See PT problem list). Pt needs skilled PT to maximize independence and safety. Pt reluctant to participate in therapy and needed encouragement of therapist and aunt to do so. Sat EOB but limited interaction with therapist. Aunt reports pt amb in the home including walking up the stairs into the home when they left the hospital recently. During that hospitalization pt had only taken a few steps. Suspect that pt performs better in her normal environment with family. With that in mind will work toward home if family can continue to manage her care. If they can not will need to reassess.           If plan is discharge home, recommend the following: Two people to help with walking and/or transfers;A lot of help with bathing/dressing/bathroom;Assist for transportation;Help with stairs or ramp for entrance;Assistance with cooking/housework   Can travel by Doctor, hospital (measurements PT);Wheelchair cushion (measurements PT) (transport chair)  Recommendations for Other Services       Functional Status Assessment Patient has had a recent decline in their functional status and demonstrates the ability to make significant improvements in function in a reasonable and predictable amount of time.     Precautions / Restrictions Precautions Precautions: Fall Precaution/Restrictions Comments: L hemiparesis (congenital CP) Required Braces or Orthoses: Other Brace Other Brace: LLE AFO Restrictions Weight Bearing Restrictions Per Provider Order: No       Mobility  Bed Mobility Overal bed mobility: Needs Assistance Bed Mobility: Supine to Sit, Sit to Supine   Sidelying to sit: Mod assist, +2 for physical assistance   Sit to supine: Mod assist, +2 for physical assistance   General bed mobility comments: Assist to bring legs off of bed, elevate trunk into sitting and bring hips to EOB.    Transfers                   General transfer comment: pt declines    Ambulation/Gait                  Stairs            Wheelchair Mobility     Tilt Bed    Modified Rankin (Stroke Patients Only)       Balance Overall balance assessment: Needs assistance Sitting-balance support: Feet supported, No upper extremity supported Sitting balance-Leahy Scale: Fair Sitting balance - Comments: supported sitting at EOB                                     Pertinent Vitals/Pain Pain Assessment Pain Assessment: Faces Faces Pain Scale: Hurts little more Pain Location: generalized with movement Pain Descriptors / Indicators: Grimacing Pain Intervention(s): Limited activity within patient's tolerance, Monitored during session, Repositioned    Home Living Family/patient expects to be discharged to:: Private residence Living Arrangements: Other relatives Available Help at Discharge: Available 24 hours/day;Family Type of Home: House Home Access: Stairs to enter Entrance Stairs-Rails: None Entrance Stairs-Number of Steps: 4   Home Layout:  One level Home Equipment: Shower seat      Prior Function Prior Level of Function : History of Falls (last six months)             Mobility Comments: Ind no AD ADLs Comments: ind with ADLs, Aunt does iADLs and medication management, does not get in shower to bathe     Extremity/Trunk Assessment   Upper Extremity Assessment Upper Extremity Assessment: Defer to OT evaluation    Lower Extremity Assessment Lower Extremity Assessment: Difficult to assess due to  impaired cognition;LLE deficits/detail LLE Deficits / Details: 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 Immediately after session pt answering questions of physician but minimal interaction with therapist. Pt with baseline developmental disability. Following commands: Impaired Following commands impaired: Follows one step commands inconsistently, Follows one step commands with increased time     Cueing Cueing Techniques: Verbal cues, Gestural cues, Tactile cues, Visual cues     General Comments General comments (skin integrity, edema, etc.): VSS    Exercises     Assessment/Plan    PT Assessment Patient needs continued PT services  PT Problem List Decreased strength;Decreased range of motion;Decreased activity tolerance;Decreased balance;Decreased mobility;Decreased cognition       PT Treatment Interventions DME instruction;Gait training;Functional mobility training;Stair training;Therapeutic activities;Therapeutic exercise;Balance training;Cognitive remediation;Patient/family education    PT Goals (Current goals can be found in the Care Plan section)  Acute Rehab PT Goals Patient Stated Goal: go home PT Goal Formulation: With patient/family Time For Goal Achievement: 12/05/23 Potential to Achieve Goals: Fair    Frequency Min 2X/week     Co-evaluation PT/OT/SLP Co-Evaluation/Treatment: Yes Reason for Co-Treatment: To address functional/ADL transfers;For patient/therapist safety PT goals addressed during session: Mobility/safety with mobility;Strengthening/ROM;Balance          AM-PAC PT "6 Clicks" Mobility  Outcome Measure Help needed turning from your back to your side while in a flat bed without using bedrails?: A Lot Help needed moving from lying on your back to sitting on the side of a flat bed without using bedrails?: A Lot Help needed moving to and from a bed to a chair (including a wheelchair)?: Total Help needed standing up from a chair using your arms (e.g., wheelchair or bedside chair)?: Total Help needed to walk in hospital room?: Total Help needed climbing 3-5 steps with a railing? : Total 6 Click Score: 8    End of Session   Activity Tolerance: Other (comment) (self limiting) Patient left: in bed;with call bell/phone within reach;with bed alarm set;with family/visitor present Nurse Communication: Mobility status PT Visit Diagnosis: Other abnormalities of gait and mobility (R26.89);Hemiplegia and hemiparesis;Muscle weakness (generalized) (M62.81) Hemiplegia - Right/Left: Left Hemiplegia - dominant/non-dominant: Non-dominant Hemiplegia - caused by: Unspecified    Time: 2841-3244 PT Time Calculation (min) (ACUTE ONLY): 14 min   Charges:   PT Evaluation $PT Eval Moderate Complexity: 1 Mod   PT General Charges $$ ACUTE PT VISIT: 1 Visit         [facility] PT Acute Rehabilitation Services Office [phone]   Angelina Ok [facility] 11/21/2023, 3:34  PM

## 2023-11-22 ENCOUNTER — Inpatient Hospital Stay (HOSPITAL_COMMUNITY)

## 2023-11-22 DIAGNOSIS — G809 Cerebral palsy, unspecified: Secondary | ICD-10-CM | POA: Diagnosis not present

## 2023-11-22 DIAGNOSIS — F79 Unspecified intellectual disabilities: Secondary | ICD-10-CM | POA: Diagnosis not present

## 2023-11-22 DIAGNOSIS — R4182 Altered mental status, unspecified: Secondary | ICD-10-CM | POA: Diagnosis not present

## 2023-11-22 DIAGNOSIS — G40901 Epilepsy, unspecified, not intractable, with status epilepticus: Secondary | ICD-10-CM | POA: Diagnosis not present

## 2023-11-22 LAB — GLUCOSE, CAPILLARY
Glucose-Capillary: 141 mg/dL — ABNORMAL HIGH (ref 70–99)
Glucose-Capillary: 143 mg/dL — ABNORMAL HIGH (ref 70–99)
Glucose-Capillary: 147 mg/dL — ABNORMAL HIGH (ref 70–99)
Glucose-Capillary: 173 mg/dL — ABNORMAL HIGH (ref 70–99)
Glucose-Capillary: 206 mg/dL — ABNORMAL HIGH (ref 70–99)
Glucose-Capillary: 207 mg/dL — ABNORMAL HIGH (ref 70–99)
Glucose-Capillary: 78 mg/dL (ref 70–99)

## 2023-11-22 LAB — ALBUMIN: Albumin: 2.4 g/dL — ABNORMAL LOW (ref 3.5–5.0)

## 2023-11-22 LAB — PHENYTOIN LEVEL, TOTAL: Phenytoin Lvl: 14 ug/mL (ref 10.0–20.0)

## 2023-11-22 MED ORDER — ATORVASTATIN CALCIUM 40 MG PO TABS
40.0000 mg | ORAL_TABLET | Freq: Every day | ORAL | Status: DC
  Administered 2023-11-23 – 2023-11-26 (×4): 40 mg via ORAL
  Filled 2023-11-22 (×4): qty 1

## 2023-11-22 MED ORDER — PHENYTOIN 50 MG PO CHEW
100.0000 mg | CHEWABLE_TABLET | Freq: Three times a day (TID) | ORAL | Status: DC
  Administered 2023-11-22 – 2023-11-26 (×12): 100 mg via ORAL
  Filled 2023-11-22 (×12): qty 2

## 2023-11-22 MED ORDER — INSULIN ASPART 100 UNIT/ML IJ SOLN
2.0000 [IU] | INTRAMUSCULAR | Status: DC
  Administered 2023-11-22: 6 [IU] via SUBCUTANEOUS
  Administered 2023-11-22: 4 [IU] via SUBCUTANEOUS
  Administered 2023-11-23: 2 [IU] via SUBCUTANEOUS

## 2023-11-22 MED ORDER — LEVETIRACETAM 750 MG PO TABS
1500.0000 mg | ORAL_TABLET | Freq: Two times a day (BID) | ORAL | Status: DC
  Administered 2023-11-22 – 2023-11-26 (×8): 1500 mg via ORAL
  Filled 2023-11-22 (×8): qty 2

## 2023-11-22 MED ORDER — LACTATED RINGERS IV BOLUS
250.0000 mL | Freq: Once | INTRAVENOUS | Status: AC
  Administered 2023-11-22: 250 mL via INTRAVENOUS

## 2023-11-22 MED ORDER — LACOSAMIDE 200 MG PO TABS
200.0000 mg | ORAL_TABLET | Freq: Two times a day (BID) | ORAL | Status: DC
  Administered 2023-11-22 – 2023-11-26 (×8): 200 mg via ORAL
  Filled 2023-11-22 (×8): qty 1

## 2023-11-22 MED ORDER — PANTOPRAZOLE SODIUM 40 MG IV SOLR
40.0000 mg | INTRAVENOUS | Status: DC
  Administered 2023-11-22: 40 mg via INTRAVENOUS
  Filled 2023-11-22: qty 10

## 2023-11-22 MED ORDER — SODIUM CHLORIDE 0.9 % IV SOLN
1000.0000 mg | INTRAVENOUS | Status: AC
  Administered 2023-11-22 – 2023-11-24 (×3): 1000 mg via INTRAVENOUS
  Filled 2023-11-22 (×3): qty 16

## 2023-11-22 NOTE — Progress Notes (Signed)
 Report called to RN 3w30. Patient with no complaints at this time. Will transfer via bed.Aunt at bedside.

## 2023-11-22 NOTE — Procedures (Signed)
 Patient Name: Patricia White  MRN: [id number]  Epilepsy Attending: Charlsie Quest  Referring Physician/Provider: Lynnae January, NP  Duration: 11/21/2023 1904 to 11/22/2023 1904   Patient history: 53 y.o. female with hx of seizures, cerebral palsy, mental retardation, nonverbal, failure to thrive, dehydration, gastritis/gastroduodenitis, severe malnutrition who was brought in by EMS after having 4 witnessed generalized tonic-clonic seizures. EEG to evaluate for seizure   Level of alertness: awake, asleep    AEDs during EEG study: LEV, LCM, PHT, Perampanel, Clonazepam   Technical aspects: This EEG study was done with scalp electrodes positioned according to the 10-20 International system of electrode placement. Electrical activity was reviewed with band pass filter of 1-70Hz , sensitivity of 7 uV/mm, display speed of 82mm/sec with a 60Hz  notched filter applied as appropriate. EEG data were recorded continuously and digitally stored.  Video monitoring was available and reviewed as appropriate.   Description: The posterior dominant rhythm consists of 8 Hz activity of moderate voltage (25-35 uV) seen predominantly in posterior head regions, symmetric and reactive to eye opening and eye closing. Sleep was characterized by vertex waves, sleep spindles (12 to 14 Hz), maximal frontocentral region.    EEG showed near continuous highly epileptiform bursts which appear periodic at 1 to 1.5 Hz alternating with 0.5 to 1-second of generalized EEG attenuation in right frontoparietal region. No clinical signs were noted. This EEG pattern is concerning for electrographic status epilepticus arising from  right frontoparietal region.   Hyperventilation and photic stimulation were not performed.      ABNORMALITY - Electrographic status epilepticus, right frontoparietal region   IMPRESSION: This study showed evidence of electrographic status epilepticus arising from right frontoparietal onset.   EEG appears  similar to previous day.   Tajai Ihde Annabelle Harman

## 2023-11-22 NOTE — Plan of Care (Signed)
 New transfer to the unit. Patient is AxOx2, room air. On EEG, family at bedside..   LUE swollen, PIV removed, and arm elevated.   Problem: Education: Goal: Knowledge of General Education information will improve Description: Including pain rating scale, medication(s)/side effects and non-pharmacologic comfort measures Outcome: Not Met (add Reason)   Problem: Clinical Measurements: Goal: Ability to maintain clinical measurements within normal limits will improve Outcome: Not Met (add Reason) Goal: Will remain free from infection Outcome: Not Met (add Reason) Goal: Diagnostic test results will improve Outcome: Not Met (add Reason) Goal: Respiratory complications will improve Outcome: Not Met (add Reason) Goal: Cardiovascular complication will be avoided Outcome: Not Met (add Reason)   Problem: Pain Managment: Goal: General experience of comfort will improve and/or be controlled Outcome: Not Met (add Reason)   Problem: Safety: Goal: Ability to remain free from injury will improve Outcome: Not Met (add Reason)   Problem: Skin Integrity: Goal: Risk for impaired skin integrity will decrease Outcome: Not Met (add Reason)

## 2023-11-22 NOTE — Progress Notes (Signed)
 NAME:  Patricia White, MRN:  [id number], DOB:  10-Feb-1971, LOS: 3 ADMISSION DATE:  11/19/2023, CONSULTATION DATE:  11/19/2023 REFERRING MD:  Theresia Lo - ED [facility], CHIEF COMPLAINT:  status epilepticus.    History of Present Illness:  53 year old woman who suffered 4 witnessed GTC's without full return of LOC.   Has CP and was recently discharged for FTT with poor oral intake. Loaded with Keppra, home medications continued.  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 Authorizing Provider  acetaminophen (TYLENOL) 325 MG tablet Take 2 tablets (650 mg total) by mouth every 6 (six) hours as needed for mild pain (pain score 1-3) (or Fever >/= 101). 11/16/23   Celine Mans, MD  atorvastatin (LIPITOR) 40 MG tablet TAKE 1 TABLET BY MOUTH DAILY Patient taking differently: Take 40 mg by mouth in the morning. 07/30/23   Celine Mans, MD  divalproex (DEPAKOTE) 250 MG DR tablet Take 2 tablets (500 mg total) by mouth 2 (two) times daily. **PATIENT NEEDS AN OFFICE VISIT FOR FUTURE Rx REFILLS** 08/01/23   Ihor Austin, NP  famotidine (PEPCID) 20 MG tablet Take 1 tablet (20 mg total) by mouth 2 (two) times daily for 14 days. 07/17/23 11/01/23  Celine Mans, MD  levocetirizine (XYZAL) 5 MG tablet TAKE 1 TABLET BY MOUTH 2 TIMES DAILY 03/17/21   Marcelyn Bruins, MD  Nutritional Supplements  (,FEEDING SUPPLEMENT, PROSOURCE PLUS) liquid Take 30 mLs by mouth 2 (two) times daily between meals. 11/16/23   Celine Mans, MD  nystatin (MYCOSTATIN) 100000 UNIT/ML suspension Take 5 mLs (500,000 Units total) by mouth 4 (four) times daily for 5 days. 11/16/23 11/21/23  Celine Mans, MD  pantoprazole (PROTONIX) 40 MG tablet Take 1 tablet (40 mg total) by mouth daily. 11/17/23   Celine Mans, MD  polyethylene glycol (MIRALAX / GLYCOLAX) 17 g packet Take 17 g by mouth daily. 11/17/23   Celine Mans, MD  senna-docusate (SENOKOT-S) 8.6-50 MG tablet Take 1 tablet by mouth at bedtime. 11/16/23   Celine Mans, MD  sucralfate (CARAFATE) 1 GM/10ML suspension Take 10 mLs (1 g total) by mouth 4 (four) times daily -  with meals and at bedtime. 11/16/23   Celine Mans, MD  TEGRETOL 200 MG tablet TAKE 1 AND 1/2 TABLETS BY MOUTH  TWICE DAILY IN THE MORNING AND  AT BEDTIME 08/07/23   Penumalli, Glenford Bayley, MD  topiramate (TOPAMAX) 25 MG tablet TAKE 3 TABLETS BY MOUTH TWICE  DAILY 08/16/23   Ihor Austin, NP    Significant Hospital Events: Including procedures, antibiotic start and stop dates in addition to other pertinent events   4/7 - admitted 4/8 - EEG shows status with partial impairment of mental status.   Interim History / Subjective:  Continues to have variable mental status - at times awake and more somnolent at others.   Objective   Blood pressure 107/84, pulse (!) 108, temperature (!) 97.3  F (36.3 C), temperature source Axillary, resp. rate (!) 22, height 5\' 5"  (1.651 m), weight 71.4 kg, SpO2 100%.        Intake/Output Summary (Last 24 hours) at 11/22/2023 1510 Last data filed at 11/22/2023 0800 Gross per 24 hour  Intake 1177.49 ml  Output 1000 ml  Net 177.49 ml   Filed Weights   11/19/23 1411 11/20/23 0000  Weight: 71.5 kg 71.4 kg   Examination: General: appears well nourished.  HENT: mucosa intact. No skin breakdown from EEG leads. Lungs: Clear bilaterally  Cardiovascular:  HS normal  Abdomen: soft.  Extremities: no active joints.  Neuro: Awake and back to baseline on right. Somnolent at times but arousable GU: Deferred  Continuous EEG shows unchanged localized seizure activity.   Ancillary Tests Personally Reviewed:   MRI shows no structural abnormalities.   Assessment & Plan:  Status epilepticus in patient with known epilepsy, likely due to poor intake and medication non-adherence given low drug levels.  No signs of intercurrent infection.  Cerebral palsy - reasonably functional at baseline. May have been in status for some time and might explain recent declin Anemia with high reticulocyte count, normal nutritional indices  Plan:  - Suspect patient has been in partial status for some time. Appetite better now that she is more awake.  -Now on 6 AED's. Less generalization, but still has unchanged intermittent localized activity.  Neurology is following. -Will leave current anticonvulsant regimen in place. Short course of steroids.  - Will move to PCU. Palliative care consultation as focal epilepsy can be very difficult to control and we may not have many more therapeutic options.  Suspect poorly controlled epilepsy may have contributed to recent functional decline.  Long-term quality of life may be impacted.  Best Practice (right click and "Reselect all SmartList Selections" daily)   Diet/type: Regular diet. DVT prophylaxis LMWH Pressure ulcer(s): N/A GI prophylaxis: N/A Lines: N/A Foley:  N/A Code Status: Now full code.  Has been DNR in the past. Last date of multidisciplinary goals of care discussion [family updated again today.]  Lynnell Catalan, MD [facility] ICU Physician [facility] [place] Critical Care  Pager: [phone] Mobile: [phone] After hours: [phone].

## 2023-11-22 NOTE — Progress Notes (Signed)
 eLink Physician-Brief Progress Note Patient Name: Patricia White DOB: Mar 11, 1971 MRN: [id number]   Date of Service  11/22/2023  HPI/Events of Note  Patient with soft blood pressures.  eICU Interventions  LR 250 ml iv fluid bolus ordered.        Thomasene Lot Devon Pretty 11/22/2023, 2:39 AM

## 2023-11-22 NOTE — Progress Notes (Signed)
 Physical Therapy Treatment Patient Details Name: Patricia White MRN: [id number] DOB: 07-16-1971 Today's Date: 11/22/2023   History of Present Illness Pt is a 53 y/o F presenting to ED on 4/7 with status epilepticus. PMH includes cerebral palsy with L hemibody weakness (UE>LE), GERD, seizures on depatoke, HTN, HLD.    PT Comments  Pt received in supine, with waxing/waning alertness and still connected to EEG wires, but participatory as able. Pt well known to this therapist from prior admission with pt seeming able to recall and improved attention to task this date compared with previous session, but still needing dense cues to maintain attention. Pt denies fatigue but more lethargic toward end of session and demonstrates decreased insight into abilities. Pt needing up to maxA for bed mobility in ICU bed and variable CGA to modA for seated balance EOB ~10 mins. Pt with loose stools while sitting EOB and modA to roll to L/R for hygiene assist, totalA for peri-care needed. LUE elevated due to edema and pt c/o LUE pain with AAROM and limb is tender to palpation. Pt continues to benefit from PT services to progress toward functional mobility goals.    If plan is discharge home, recommend the following: Two people to help with walking and/or transfers;A lot of help with bathing/dressing/bathroom;Assist for transportation;Help with stairs or ramp for entrance;Assistance with cooking/housework   Can travel by Doctor, hospital (measurements PT);Wheelchair cushion (measurements PT) (transport chair)    Recommendations for Other Services       Precautions / Restrictions Precautions Precautions: Fall Recall of Precautions/Restrictions: Impaired Precaution/Restrictions Comments: bowel incontinence, seizure precs, L hemiparesis (congenital CP) Required Braces or Orthoses: Other Brace Other Brace: LLE AFO Restrictions Weight Bearing Restrictions Per Provider  Order: No     Mobility  Bed Mobility Overal bed mobility: Needs Assistance Bed Mobility: Sit to Supine, Rolling, Sidelying to Sit Rolling: Mod assist, Used rails Sidelying to sit: Max assist   Sit to supine: Mod assist, +2 for safety/equipment   General bed mobility comments: To L EOB; Assist to bring legs off of bed, elevate trunk into sitting and bring hips to EOB, pt needing increased time today to initiate and nods yes when asked if she is nervous or afraid to fall.    Transfers Overall transfer level: Needs assistance Equipment used: 2 person hand held assist Transfers: Sit to/from Stand Sit to Stand: Max assist, Total assist, +2 physical assistance, From elevated surface           General transfer comment: STS x1 trial from EOB with face to face but unable to significantly lift her hips off bed, pt c/o fatigue and more lethargic, returned to supine.    Ambulation/Gait               General Gait Details: not safe to attempt this date   Stairs             Wheelchair Mobility     Tilt Bed    Modified Rankin (Stroke Patients Only)       Balance Overall balance assessment: Needs assistance Sitting-balance support: Feet supported, No upper extremity supported Sitting balance-Leahy Scale: Poor Sitting balance - Comments: Pt needing min/modA and single UE support Postural control: Left lateral lean                                  Communication  Communication Communication: Impaired  Cognition Arousal: Lethargic Behavior During Therapy: Impulsive   PT - Cognitive impairments: Difficult to assess, Sequencing, Initiation, Problem solving, Safety/Judgement, Awareness, Attention Difficult to assess due to: Impaired communication                     PT - Cognition Comments: Waxing/waning alertness. PTA very familiar with this patient from prior hospital admission (last week). Pt with inconsistent attention to task, likely due to  recent and possibly ongoing seizure activity, pt cooperative with encouragement from PTA and her aunt Patricia White. Does better with simple commands. Pt has some difficulty expressing her needs (typically she is not incontinent of bowels/bladder according to her aunt) but is c/o LUE pain. Following commands: Impaired Following commands impaired: Follows one step commands inconsistently, Follows one step commands with increased time    Cueing Cueing Techniques: Verbal cues, Gestural cues, Tactile cues, Visual cues  Exercises General Exercises - Lower Extremity Long Arc Quad: AROM, Both, Seated, 10 reps (visual/verbal demo throughout to continue) Other Exercises Other Exercises: supine LUE AROM: shoulder and elbow flexion x5 reps (and AA a couple reps using RUE) Other Exercises: seated lateral leans and pushing to midline x2 reps ea direction    General Comments General comments (skin integrity, edema, etc.): Pt still with EEG wires attached, error message on computer screen (RN notified); BP 116/85 (96) taken supine a few mins prior to EOB transfer but not able to assess seated BP due to pt impulsivity and needing hands-on assist, then pt observed to be soiled so returned to supine and pt given peri-care by PTA, purewick removed (RN notified).      Pertinent Vitals/Pain Pain Assessment Pain Assessment: Faces Faces Pain Scale: Hurts little more Pain Location: LUE to touch Pain Descriptors / Indicators: Grimacing, Guarding Pain Intervention(s): Limited activity within patient's tolerance, Monitored during session, Repositioned    Home Living                          Prior Function            PT Goals (current goals can now be found in the care plan section) Acute Rehab PT Goals Patient Stated Goal: To go home PT Goal Formulation: With patient/family Time For Goal Achievement: 12/05/23 Progress towards PT goals: Progressing toward goals    Frequency    Min 2X/week       PT Plan      Co-evaluation              AM-PAC PT "6 Clicks" Mobility   Outcome Measure  Help needed turning from your back to your side while in a flat bed without using bedrails?: A Lot Help needed moving from lying on your back to sitting on the side of a flat bed without using bedrails?: A Lot Help needed moving to and from a bed to a chair (including a wheelchair)?: Total Help needed standing up from a chair using your arms (e.g., wheelchair or bedside chair)?: Total Help needed to walk in hospital room?: Total Help needed climbing 3-5 steps with a railing? : Total 6 Click Score: 8    End of Session Equipment Utilized During Treatment: Other (comment) (bed pad assist) Activity Tolerance: Patient limited by lethargy Patient left: in bed;with call bell/phone within reach;with bed alarm set;with family/visitor present;Other (comment) (positioned with LUE elevated for edema mgmt) Nurse Communication: Mobility status;Other (comment) (needs new purewick, may need more clean-up if  BM still ongoing (we cleaned up as able).) PT Visit Diagnosis: Other abnormalities of gait and mobility (R26.89);Hemiplegia and hemiparesis;Muscle weakness (generalized) (M62.81) Hemiplegia - Right/Left: Left Hemiplegia - dominant/non-dominant: Non-dominant Hemiplegia - caused by: Unspecified     Time: 2841-3244 PT Time Calculation (min) (ACUTE ONLY): 31 min  Charges:    $Therapeutic Exercise: 8-22 mins $Therapeutic Activity: 8-22 mins PT General Charges $$ ACUTE PT VISIT: 1 Visit                     Sandip Power P., PTA Acute Rehabilitation Services Secure Chat Preferred 9a-5:30pm Office: [phone]    Dorathy Kinsman [facility] 11/22/2023, 6:42 PM

## 2023-11-22 NOTE — Hospital Course (Addendum)
 Patricia White is a 53 y.o.female with a history of cerebral palsy and seizure disordered who was admitted to the ICU at [facility] for status epilepticus. Her hospital course is detailed below:  Status epilepticus Presented to ED after 4 witnessed general tonic clonic seizures and continued LOC.  Known epilepsy, on Depakote 500 mg twice daily and carbamazepine 300 mg twice daily but concern for medication noncompliance given low carbamazepine/valproic acid levels upon admission.  Neurology was consulted, received Keppra load.  Home meds stopped and transitioned to Keppra 1500 mg daily, Vimpat 200 mg BID and Dilantin 100 mg q8hr.  Patient was noted to barely be protecting her airway, given DNR/DNI status further AED escalation was held off and patient was admitted to ICU.  EEGs showed continued status epilepticus, loaded with perampanel, continued on 4 mg daily. Changed to full code per grandmother request.  EEG on 4/9 improved, started on Clonazepam 0.5 mg TID.  EEG on 4/10 unchanged from prior day, started on IV Solu-Medrol x 3 days for anti-inflammatory effect given concern for partial status epilepticus ongoing for weeks. Transferred to FMTS on 4/11. Started prednisone taper at 60mg  daily, titrating down by 10 mg every week. Palliative care consulted for GOC conversation, pt switched back to DNR/DNI. AED regimen on discharge included  Keppra 1500 mg daily, Vimpat 200 mg twice daily, Dilantin 100 mg every 8 hours,  clonazepam 0.5 mg 3 times daily and Perampanel 4mg  daily.   Hypotension Initially postictal and hypotensive despite IV fluid resuscitation.  Required Levophed to maintain SBP in 90s likely in the setting of status epilepticus as above.  Levophed stopped and 4/8 PM as seizures improved.  Received fluid bolus, maintenance IV fluids for ongoing hypotension once on the floor.IV fluids were stopped and patient continued to do well. Patient does have lower Bps, which seem to be her baseline.   AKI Cr  initially elevated to 1.1 from normal baseline, subsequently improved with IV fluid resuscitation.  Macrocytic anemia Chronic, normal folate and elevated B12.  Likely in the setting of chronic malnutrition.  Hemoglobin remained stable with baseline throughout admission.  Left hand swelling  Noticed some left hand swelling but DVT and XR were negative. Xray showed healed humeral fracture. Ultrasound showed bicep tendinitis and possible partial tear. The swelling decreased during admission and patient declined any pain.   Concern for negligence Patient admitted twice in short period of time. Per neurology note, concern that patient had low level of her AEDs and may have been in status epilepticus for a while prior to being admitted to hospital which may have been the reason for ICU stay. Given there was concern for negligence at last admission but ultimately social work did not find any concern, SW was consulted. APS was contacted regarding these concerns.   Other chronic conditions were medically managed with home medications and formulary alternatives as necessary (HLD, HTN)  PCP Follow-up Recommendations: HTN meds (indapamide and losartan) discontinued given relative hypotension, restart as indicated in setting of dilantin use Ensure close neurology follow up and medication adherence  Patient had swelling on L hand, please follow up for resolution and consider work up for L bicep tendinitis if patient is symptomatic

## 2023-11-22 NOTE — Progress Notes (Signed)
 Subjective: NAEO. Had pancakes this morning.Aunt at bedside  ROS: Unable to obtain due to poor mental status  Examination  Vital signs in last 24 hours: Temp:  [97.5 F (36.4 C)-98 F (36.7 C)] 97.6 F (36.4 C) (04/10 0703) Pulse Rate:  [70-116] 95 (04/10 0900) Resp:  [12-27] 18 (04/10 0900) BP: (68-118)/(21-75) 118/56 (04/10 0900) SpO2:  [86 %-100 %] 100 % (04/10 0900)  General: lying in bed, NAD Neuro: Awake, alert, able to tell me her name and that she is at the hospital, when asked about month, states may and then states that is when her birthday is again, follows commands, antigravity strength in all 4 extremities with left hemiparesis (baseline)   Basic Metabolic Panel: Recent Labs  Lab 11/16/23 0346 11/19/23 1345 11/20/23 0300  NA 135 137 142  K 3.8 3.4* 4.9  CL 105 108 116*  CO2 22 19* 16*  GLUCOSE 92 101* 150*  BUN 10 7 6   CREATININE 0.84 1.15* 0.93  CALCIUM 8.5* 8.2* 8.1*  MG 1.8 1.5* 2.1  PHOS 4.5 3.0 3.9    CBC: Recent Labs  Lab 11/16/23 0346 11/19/23 1345 11/19/23 1626 11/20/23 0507  WBC 4.7 5.3 8.6 8.7  NEUTROABS  --  3.5  --   --   HGB 7.5* 7.0* 8.9* 8.5*  HCT 24.0* 23.4* 29.1* 28.1*  MCV 98.4 104.0* 102.5* 102.2*  PLT 204 368 450* 391     Coagulation Studies: No results for input(s): "LABPROT", "INR" in the last 72 hours.  Imaging No new brain imaging  ASSESSMENT AND PLAN: 53 year old female with history of epilepsy, cerebral palsy, mental retardation, recent admission with failure to thrive who was brought in with status epilepticus.  Patient mental status has since improved but EEG continues to show electrographic status epilepticus   Convulsive status epilepticus, resolved Electrographic status epilepticus, refractory - Etiology of status epilepticus: Patient had low levels of Depakote and carbamazepine.  Grandmother states she has been giving patient medications.   Suspect there is some medication noncompliance (of note patient's  grandmother is 66 herself).  About 1 year ago Depakote level was 72 and about 2 years ago carbamazepine level was 6.3   Recommendations - Patient has had recent admission for failure to thrive.  At that time no EEG was performed.  However given the eeg findings and current mental status, I am concerned that patient could have been in status epilepticus for weeks and therefore can be very difficult to control - Will strart IV solumedrol 100mg  for 3 days for the anti-inflammatory effect  - Continue Keppra 1500 mg daily, Vimpat 200 mg twice daily, Dilantin 100 mg every 8 hours.  Will check Dilantin levels (were not drawn yesterday) - Continue clonazepam 0.5 mg 3 times daily and Perampanel 4mg  daily - Goal is to minimize seizures without causing excessive sedation   I have spent a total of  36 minutes with the patient reviewing hospital notes,  test results, labs and examining the patient as well as establishing an assessment and plan > 50% of time was spent in direct patient care.    Lindie Spruce Epilepsy [facility] For questions after 5pm please refer to AMION to reach the Neurologist on call

## 2023-11-23 ENCOUNTER — Inpatient Hospital Stay (HOSPITAL_COMMUNITY)

## 2023-11-23 DIAGNOSIS — R4182 Altered mental status, unspecified: Secondary | ICD-10-CM | POA: Diagnosis not present

## 2023-11-23 DIAGNOSIS — M7989 Other specified soft tissue disorders: Secondary | ICD-10-CM

## 2023-11-23 DIAGNOSIS — F79 Unspecified intellectual disabilities: Secondary | ICD-10-CM | POA: Diagnosis not present

## 2023-11-23 DIAGNOSIS — G40901 Epilepsy, unspecified, not intractable, with status epilepticus: Secondary | ICD-10-CM | POA: Diagnosis not present

## 2023-11-23 DIAGNOSIS — I959 Hypotension, unspecified: Secondary | ICD-10-CM | POA: Insufficient documentation

## 2023-11-23 DIAGNOSIS — G809 Cerebral palsy, unspecified: Secondary | ICD-10-CM | POA: Diagnosis not present

## 2023-11-23 LAB — HEMOGLOBIN A1C
Hgb A1c MFr Bld: 5.3 % (ref 4.8–5.6)
Mean Plasma Glucose: 105.41 mg/dL

## 2023-11-23 LAB — GLUCOSE, CAPILLARY
Glucose-Capillary: 111 mg/dL — ABNORMAL HIGH (ref 70–99)
Glucose-Capillary: 108 mg/dL — ABNORMAL HIGH (ref 70–99)
Glucose-Capillary: 91 mg/dL (ref 70–99)
Glucose-Capillary: 125 mg/dL — ABNORMAL HIGH (ref 70–99)
Glucose-Capillary: 151 mg/dL — ABNORMAL HIGH (ref 70–99)

## 2023-11-23 LAB — BASIC METABOLIC PANEL WITH GFR
Anion gap: 12 (ref 5–15)
BUN: <5 mg/dL — ABNORMAL LOW (ref 6–20)
CO2: 19 mmol/L — ABNORMAL LOW (ref 22–32)
Calcium: 8.8 mg/dL — ABNORMAL LOW (ref 8.9–10.3)
Chloride: 112 mmol/L — ABNORMAL HIGH (ref 98–111)
Creatinine, Ser: 0.87 mg/dL (ref 0.44–1.00)
GFR, Estimated: >60 mL/min (ref >60)
Glucose, Bld: 85 mg/dL (ref 70–99)
Potassium: 3.6 mmol/L (ref 3.5–5.1)
Sodium: 143 mmol/L (ref 135–145)

## 2023-11-23 LAB — CBC
HCT: 26.8 % — ABNORMAL LOW (ref 36.0–46.0)
Hemoglobin: 8.3 g/dL — ABNORMAL LOW (ref 12.0–15.0)
MCH: 31.2 pg (ref 26.0–34.0)
MCHC: 31 g/dL (ref 30.0–36.0)
MCV: 100.8 fL — ABNORMAL HIGH (ref 80.0–100.0)
Platelets: 465 10*3/uL — ABNORMAL HIGH (ref 150–400)
RBC: 2.66 MIL/uL — ABNORMAL LOW (ref 3.87–5.11)
RDW: 16.3 % — ABNORMAL HIGH (ref 11.5–15.5)
WBC: 6 10*3/uL (ref 4.0–10.5)
nRBC: 0 % (ref 0.0–0.2)

## 2023-11-23 MED ORDER — LACTATED RINGERS IV BOLUS
500.0000 mL | Freq: Once | INTRAVENOUS | Status: AC
  Administered 2023-11-23: 500 mL via INTRAVENOUS

## 2023-11-23 MED ORDER — LACTATED RINGERS IV SOLN
INTRAVENOUS | Status: AC
Start: 2023-11-23 — End: 2023-11-23

## 2023-11-23 MED ORDER — PANTOPRAZOLE SODIUM 40 MG PO TBEC
40.0000 mg | DELAYED_RELEASE_TABLET | Freq: Every day | ORAL | Status: DC
  Administered 2023-11-23 – 2023-11-26 (×4): 40 mg via ORAL
  Filled 2023-11-23 (×4): qty 1

## 2023-11-23 MED ORDER — LACTATED RINGERS IV SOLN: INTRAVENOUS | Status: DC

## 2023-11-23 MED ORDER — INSULIN ASPART 100 UNIT/ML IJ SOLN
0.0000 [IU] | Freq: Three times a day (TID) | INTRAMUSCULAR | Status: DC
  Administered 2023-11-23 – 2023-11-25 (×3): 1 [IU] via SUBCUTANEOUS
  Administered 2023-11-25 – 2023-11-26 (×2): 2 [IU] via SUBCUTANEOUS

## 2023-11-23 NOTE — Care Management Important Message (Signed)
 Important Message  Patient Details  Name: Patricia White MRN: [id number] Date of Birth: 1971-04-15   Important Message Given:        Dorena Bodo 11/23/2023, 3:59 PM

## 2023-11-23 NOTE — Progress Notes (Signed)
 LTM EEG discontinued - no skin breakdown at [facility]. Performed by CP

## 2023-11-23 NOTE — Progress Notes (Signed)
 Occupational Therapy Treatment Patient Details Name: Patricia White MRN: [id number] DOB: Dec 23, 1970 Today's Date: 11/23/2023   History of present illness Pt is a 53 y/o F presenting to ED on 4/7 with status epilepticus. PMH includes cerebral palsy with L hemibody weakness (UE>LE), GERD, seizures on depatoke, HTN, HLD.   OT comments  Pt progressing towards goals, more alert this session and able to complete bed mobility with mod A. Able to sit EOB ~10 mins for grooming task and BLE therex. Pt reports dizziness, reports this is common even at home when she sits up. Pt presenting with impairments listed below, will follow acutely. Continue to recommend HHOT at d/c.       If plan is discharge home, recommend the following:  Assistance with cooking/housework;Supervision due to cognitive status;Direct supervision/assist for financial management;Direct supervision/assist for medications management;A lot of help with walking and/or transfers;A lot of help with bathing/dressing/bathroom   Equipment Recommendations  None recommended by OT    Recommendations for Other Services PT consult    Precautions / Restrictions Precautions Precautions: Fall Recall of Precautions/Restrictions: Impaired Precaution/Restrictions Comments: bowel incontinence, seizure precs, L hemiparesis (congenital CP) Required Braces or Orthoses: Other Brace Other Brace: LLE AFO Restrictions Weight Bearing Restrictions Per Provider Order: No       Mobility Bed Mobility Overal bed mobility: Needs Assistance Bed Mobility: Supine to Sit, Sit to Supine     Supine to sit: Mod assist Sit to supine: Mod assist        Transfers                  Lateral/Scoot Transfers: Min assist General transfer comment: min A scooting toward HOB     Balance Overall balance assessment: Needs assistance Sitting-balance support: Feet supported, No upper extremity supported Sitting balance-Leahy Scale: Fair                                      ADL either performed or assessed with clinical judgement   ADL Overall ADL's : Needs assistance/impaired     Grooming: Set up;Wash/dry face;Sitting                                      Extremity/Trunk Assessment Upper Extremity Assessment LUE Deficits / Details: Congential CP, L hemi,   Lower Extremity Assessment LLE Deficits / Details: 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        Vision   Additional Comments: will further assess   Perception Perception Perception: Not tested   Praxis Praxis Praxis: Not tested   Communication Communication Communication: Impaired   Cognition Arousal: Alert Behavior During Therapy: WFL for tasks assessed/performed Cognition: History of cognitive impairments             OT - Cognition Comments: 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, required encouragement from family for OOB activity                 Following commands: Impaired Following commands impaired: Follows one step commands inconsistently, Follows one step commands with increased time      Cueing   Cueing Techniques: Verbal cues, Gestural cues, Tactile cues, Visual cues  Exercises Other Exercises Other Exercises: seated leg kicks BLE    Shoulder Instructions       General Comments VSS    Pertinent Vitals/ Pain  Pain Assessment Pain Assessment: No/denies pain  Home Living                                          Prior Functioning/Environment              Frequency  Min 1X/week        Progress Toward Goals  OT Goals(current goals can now be found in the care plan section)  Progress towards OT goals: Progressing toward goals  Acute Rehab OT Goals Patient Stated Goal: none stated OT Goal Formulation: With family Time For Goal Achievement: 12/05/23 Potential to Achieve Goals: Good ADL Goals Pt Will Perform Upper Body Dressing: with  supervision;sitting Pt Will Perform Lower Body Dressing: with supervision;sitting/lateral leans;sit to/from stand Pt Will Transfer to Toilet: with supervision;ambulating;regular height toilet  Plan      Co-evaluation                 AM-PAC OT "6 Clicks" Daily Activity     Outcome Measure   Help from another person eating meals?: A Little Help from another person taking care of personal grooming?: A Little Help from another person toileting, which includes using toliet, bedpan, or urinal?: A Lot Help from another person bathing (including washing, rinsing, drying)?: A Lot Help from another person to put on and taking off regular upper body clothing?: A Lot Help from another person to put on and taking off regular lower body clothing?: A Lot 6 Click Score: 14    End of Session    OT Visit Diagnosis: Unsteadiness on feet (R26.81);History of falling (Z91.81);Muscle weakness (generalized) (M62.81)   Activity Tolerance Patient tolerated treatment well   Patient Left in bed;with call bell/phone within reach;with bed alarm set;with family/visitor present   Nurse Communication Mobility status        Time: 7425-9563 OT Time Calculation (min): 18 min  Charges: OT General Charges $OT Visit: 1 Visit OT Treatments $Self Care/Home Management : 8-22 mins  Carver Fila, OTD, OTR/L SecureChat Preferred Acute Rehab [phone]   Carver Fila Koonce 11/23/2023, 5:05 PM

## 2023-11-23 NOTE — Progress Notes (Signed)
 Subjective: No acute events overnight.  No new concerns.  Patient awake, smiling today.  ROS: negative except above  Examination  Vital signs in last 24 hours: Temp:  [97 F (36.1 C)-99 F (37.2 C)] 98.3 F (36.8 C) (04/11 0741) Pulse Rate:  [79-108] 99 (04/11 0741) Resp:  [15-22] 18 (04/11 0741) BP: (77-118)/(52-85) 85/60 (04/11 0741) SpO2:  [90 %-100 %] 100 % (04/11 0741) Weight:  [71.3 kg] 71.3 kg (04/10 1836)  General: lying in bed, NAD Neuro: Alert, oriented to time place and person, follows commands, antigravity strength in upper extremities with left hemiparesis (baseline)  Basic Metabolic Panel: Recent Labs  Lab 11/19/23 1345 11/20/23 0300  NA 137 142  K 3.4* 4.9  CL 108 116*  CO2 19* 16*  GLUCOSE 101* 150*  BUN 7 6  CREATININE 1.15* 0.93  CALCIUM 8.2* 8.1*  MG 1.5* 2.1  PHOS 3.0 3.9    CBC: Recent Labs  Lab 11/19/23 1345 11/19/23 1626 11/20/23 0507  WBC 5.3 8.6 8.7  NEUTROABS 3.5  --   --   HGB 7.0* 8.9* 8.5*  HCT 23.4* 29.1* 28.1*  MCV 104.0* 102.5* 102.2*  PLT 368 450* 391     Coagulation Studies: No results for input(s): "LABPROT", "INR" in the last 72 hours.  Imaging No new brain imaging overnight  ASSESSMENT AND PLAN: 53 year old female with history of epilepsy, cerebral palsy, mental retardation, recent admission with failure to thrive who was brought in with status epilepticus.  Patient mental status has since improved but EEG continues to show electrographic status epilepticus   Convulsive status epilepticus, resolved Electrographic status epilepticus, refractory - Etiology of status epilepticus: Patient had low levels of Depakote and carbamazepine.  Grandmother states she has been giving patient medications.   Suspect there is some medication noncompliance (of note patient's grandmother is 67 herself).  About 1 year ago Depakote level was 72 and about 2 years ago carbamazepine level was 6.3   Recommendations - Patient has had recent  admission for failure to thrive.  At that time no EEG was performed.  However given the eeg findings and current mental status, I am concerned that patient could have been in status epilepticus for weeks and therefore can be very difficult to control - Continue IV solumedrol 100mg  D2/3 days for the anti-inflammatory effect followed by prednisone taper.  Recommend starting at 60 mg and then tapering off by 10 mg every week. - Continue Keppra 1500 mg daily, Vimpat 200 mg twice daily, Dilantin 100 mg every 8 hours.  Will check Dilantin levels (were not drawn yesterday) - Continue clonazepam 0.5 mg 3 times daily and Perampanel 4mg  daily -DC LTM EEG - PT/OT today -Discussed plan with Dr.Eniola via secure chat   I have spent a total of  37 minutes with the patient reviewing hospital notes,  test results, labs and examining the patient as well as establishing an assessment and plan > 50% of time was spent in direct patient care.      Lindie Spruce Epilepsy [facility] For questions after 5pm please refer to AMION to reach the Neurologist on call

## 2023-11-23 NOTE — Assessment & Plan Note (Addendum)
 HLD: Lipitor 40 mg daily DM2: sensitive SSI, CBG checks QID GERD: Protonix 40 mg daily

## 2023-11-23 NOTE — TOC Progression Note (Signed)
 Transition of Care [facility]) - Progression Note    Patient Details  Name: Patricia White MRN: [id number] Date of Birth: [birth year]  Transition of Care [facility]) CM/SW Contact  Kermit Balo, RN Phone Number: 11/23/2023, 1:37 PM  Clinical Narrative:     Plan is for home with resumption of HH services through [facility]. Family will transport home when medically ready.  TOC following.  Expected Discharge Plan: Home w Home Health Services Barriers to Discharge: Continued Medical Work up  Expected Discharge Plan and Services   Discharge Planning Services: CM Consult   Living arrangements for the past 2 months: Single Family Home                                       Social Determinants of Health (SDOH) Interventions SDOH Screenings   Food Insecurity: No Food Insecurity (11/19/2023)  Housing: Low Risk  (11/19/2023)  Transportation Needs: No Transportation Needs (11/19/2023)  Utilities: Not At Risk (11/19/2023)  Alcohol Screen: Low Risk  (03/24/2023)  Depression (PHQ2-9): Low Risk  (08/09/2023)  Financial Resource Strain: Low Risk  (03/24/2023)  Physical Activity: Insufficiently Active (03/24/2023)  Social Connections: Moderately Isolated (11/19/2023)  Stress: No Stress Concern Present (03/24/2023)  Tobacco Use: Low Risk  (11/19/2023)  Health Literacy: Adequate Health Literacy (03/24/2023)    Readmission Risk Interventions    11/16/2023   11:48 AM  Readmission Risk Prevention Plan  Transportation Screening Complete  PCP or Specialist Appt within 5-7 Days Complete  Home Care Screening Complete  Medication Review (RN CM) Complete

## 2023-11-23 NOTE — Assessment & Plan Note (Addendum)
 Edema from just distal to shoulder down to hand. Noted on exam by Dr Lum Babe. Pt reports pain, limited mobility in LUE. Family member reports pt fell at home recently. Differential includes UE DVT, thrombophlebitis, biceps tear/tendon rupture -- POCUS of LUE -- duplex DVT LUE -- DG L shoulder

## 2023-11-23 NOTE — Plan of Care (Signed)
 FMTS Interim Progress Note  Patient assessed at bedside with aunt present.  Sitting up stooping on some soda.  Intermittently interactive.  Aunt expressed concern that patient's continued poor appetite states she did have a few bites of dinner.  Concerned about her blood pressure being low, states she has been trying to drink fluids but discussed she is getting a lot of fluids through her IV.  O: BP 97/62   Pulse (!) 106   Temp 98.2 F (36.8 C) (Oral)   Resp 18   Ht 5\' 5"  (1.651 m)   Wt 71.3 kg   SpO2 98%   BMI 26.16 kg/m    General: Sitting up in bed, NAD. CV: RRR without murmur RESP: CTAB.  Normal work of breathing on room air. Neuro: Chronic left-sided motor deficit.  5/5 right sided upper and lower extremity strength.  Responsive to some questions including "I need to go poo".  No tremors noted.   A/P: Status epilepticus No seizure-like activity or new neurologic deficit noted upon exam.  Continue plan per neurology.  Hypotension Continues to have borderline hypotensive pressures.  Goal of MAP >65.  Given multiple prior supplementation with fluid bolus and MIVF, if continues to be hypotensive will consider midodrine 5 mg every 8 hour overnight if needed.  Low threshold to call CCM if worsening despite midodrine.  Elberta Fortis, MD 11/23/2023, 9:57 PM PGY-2, [facility] Family Medicine Service pager [phone]

## 2023-11-23 NOTE — Assessment & Plan Note (Addendum)
 Likely in the setting of status epilepticus.  Required levophed drip in ICU. Some hypotension overnight with MAP>65. improved with 500 cc fluid bolus. -- Maintenance fluids at 100 mL/h for 10 hours, will reassess in AM -- can consider midodrine if HoTN worsens --Continue to monitor vitals per unit -- AM BMP, Mg as above

## 2023-11-23 NOTE — Progress Notes (Signed)
 BP 75/56, MAP 65, other VS WDL. MD on call notified. Pt asymptomatic, no change in mental status.  Will continue to monitor pt.

## 2023-11-23 NOTE — Inpatient Diabetes Management (Signed)
 Inpatient Diabetes Program Recommendations  AACE/ADA: New Consensus Statement on Inpatient Glycemic Control (2015)  Target Ranges:  Prepandial:   less than 140 mg/dL      Peak postprandial:   less than 180 mg/dL (1-2 hours)      Critically ill patients:  140 - 180 mg/dL    Latest Reference Range & Units 11/22/23 23:46 11/23/23 04:03 11/23/23 07:42  Glucose-Capillary 70 - 99 mg/dL 846 (H) 962 (H) 952 (H)  (H): Data is abnormally high     Current Insulin Orders: Novolog 2-4-6 Q4 hours    Getting Solumedrol 1000 mg Q24H    MD- Please consider changing the Novolog SSI to the 0-9 unit Sensitive scale Q4 hours    --Will follow patient during hospitalization--  Ambrose Finland RN, MSN, CDCES Diabetes Coordinator Inpatient Glycemic Control Team Team Pager: [phone] (8a-5p)

## 2023-11-23 NOTE — Assessment & Plan Note (Addendum)
 Ongoing status per EEG done this AM. Mental status improving per neuro. Now on 5 AEDs. Low levels of Depakote and Tegretol on admission. Neuro concerned that pt has been in status for weeks, most likely in the setting of AED nonadherence, poor p.o. intake.  No evidence of infection found on workup while in ICU. Will initiate GOC/CODE STATUS conversation with patient/family, palliative has already been consulted. Improved PO intake today, no noticeable changes in mental status other than increased sleepiness per family at bedside. --Neuro recs 4/11: --continue klonipin 0.5mg  TID --continue vimpat 200mg  BID --continue keppra 1500mg  BID --continue fycompa 4mg  daily --dilantin 100mg  TID, f/u dilantin levels --continue Ativan 2 mg q4 PRN --d/c long term EEG --continue IV Solu-Medrol 100 mg x 3 days (ending 4/12) for anti-inflammatory effect, then prednisone taper (start at 60mg , dec by 10mg  q week) --Appreciate palliative recs --Seizure precautions -- Continuous cardiac monitoring -- PT/OT eval and treat -- AM CBC, BMP, Mg

## 2023-11-23 NOTE — Progress Notes (Signed)
 LUE venous duplex (limited) has been completed.   Results can be found under chart review under CV PROC. 11/23/2023 6:35 PM Ethie Curless RVT, RDMS

## 2023-11-23 NOTE — Progress Notes (Signed)
 eLink Physician-Brief Progress Note Patient Name: Patricia White DOB: May 07, 1971 MRN: [id number]   Date of Service  11/23/2023  HPI/Events of Note  Patient with recurrence of low blood pressure tonight.  eICU Interventions  LR 500 ml iv bolus x 1 ordered.        Thomasene Lot Wilbern Pennypacker 11/23/2023, 5:02 AM

## 2023-11-23 NOTE — Progress Notes (Signed)
   11/23/23 0442  Assess: MEWS Score  BP (!) 77/56  MAP (mmHg) 65  Pulse Rate 94  Resp 18  Level of Consciousness Alert  SpO2 99 %  O2 Device Room Air  Assess: MEWS Score  MEWS Temp 0  MEWS Systolic 2  MEWS Pulse 0  MEWS RR 0  MEWS LOC 0  MEWS Score 2  MEWS Score Color Yellow  Assess: if the MEWS score is Yellow or Red  Were vital signs accurate and taken at a resting state? Yes  Does the patient meet 2 or more of the SIRS criteria? No  MEWS guidelines implemented  No, previously yellow, continue vital signs every 4 hours  Provider Notification  Provider Name/Title Marnette Burgess, MD  Date Provider Notified 11/23/23  Time Provider Notified [phone]  Method of Notification Call  Notification Reason Other (Comment)  Provider response See new orders  Date of Provider Response 11/23/23  Time of Provider Response 0547  Assess: SIRS CRITERIA  SIRS Temperature  0  SIRS Respirations  0  SIRS Pulse 1  SIRS WBC 0  SIRS Score Sum  1   Pt not in distress, asymptomatic, no change in mental status

## 2023-11-23 NOTE — Progress Notes (Signed)
 Pt BP 82/55, MAP 63, other VS WDL, pt asymptomatic, no change in mental status. Birdena Crandall, MD notified.  Repeated BP 95/65 with MAP of 65. No new interventions at this time.  Ok to give night time meds per MD.

## 2023-11-23 NOTE — Procedures (Addendum)
 Patient Name: Patricia White  MRN: [id number]  Epilepsy Attending: Charlsie Quest  Referring Physician/Provider: Lynnae January, NP  Duration: 11/22/2023 1904 to 11/23/2023 0110  Patient history: 53 y.o. female with hx of seizures, cerebral palsy, mental retardation, nonverbal, failure to thrive, dehydration, gastritis/gastroduodenitis, severe malnutrition who was brought in by EMS after having 4 witnessed generalized tonic-clonic seizures. EEG to evaluate for seizure   Level of alertness: awake, asleep    AEDs during EEG study: LEV, LCM, PHT, Perampanel, Clonazepam   Technical aspects: This EEG study was done with scalp electrodes positioned according to the 10-20 International system of electrode placement. Electrical activity was reviewed with band pass filter of 1-70Hz , sensitivity of 7 uV/mm, display speed of 52mm/sec with a 60Hz  notched filter applied as appropriate. EEG data were recorded continuously and digitally stored.  Video monitoring was available and reviewed as appropriate.   Description: The posterior dominant rhythm consists of 8 Hz activity of moderate voltage (25-35 uV) seen predominantly in posterior head regions, symmetric and reactive to eye opening and eye closing. Sleep was characterized by vertex waves, sleep spindles (12 to 14 Hz), maximal frontocentral region.    EEG showed near continuous highly epileptiform bursts which appear periodic at 1 to 1.5 Hz alternating with 0.5 to 1-second of generalized EEG attenuation in right frontoparietal region. No clinical signs were noted. This EEG pattern is concerning for electrographic status epilepticus arising from  right frontoparietal region.   EEG was difficult to interpret between 11/23/2023 0110 to 0515 due to significant electrode artifact.   ABNORMALITY - Electrographic status epilepticus, right frontoparietal region   IMPRESSION: This study showed evidence of electrographic status epilepticus arising from right  frontoparietal onset.   EEG appears similar to previous day.   Verania Salberg Annabelle Harman

## 2023-11-23 NOTE — Progress Notes (Signed)
 Daily Progress Note Intern Pager: [phone]  Patient name: Patricia White Medical record number: [id number] Date of birth: [birth year] Age: 53 y.o. Gender: female  Primary Care Provider: Celine Mans, MD Consultants: palliative care, neurology Code Status: Full  Pt Overview and Major Events to Date:  4/7: admitted to ICU in status epilepticus 4/8: code status changed from DNR/DNI to Full code 4/11: transferred to FMTS  Assessment and Plan: Patricia White is a 53 year old female with a history of epilepsy, cerebral palsy with left hemiparesis, moderate intellectual disability who presented after 4 witnessed seizures at home and found to be in status epilepticus.  Patient was initially in the ICU due to concern for inability to protect her airway. Patient has been transferred to the floor, continues to be in status.  Neurology providing ongoing recommendations for patient's AED regimen.  Patient required pressors in the ICU, continuing to monitor patient's blood pressure. CODE STATUS was changed to full code per neurology's discussion with patient's grandmother, pending palliative consult to discuss GOC.  Assessment & Plan Status epilepticus (HCC) Ongoing status per EEG done this AM. Mental status improving per neuro. Now on 5 AEDs. Low levels of Depakote and Tegretol on admission. Neuro concerned that pt has been in status for weeks, most likely in the setting of AED nonadherence, poor p.o. intake.  No evidence of infection found on workup while in ICU. Will initiate GOC/CODE STATUS conversation with patient/family, palliative has already been consulted. Improved PO intake today, no noticeable changes in mental status other than increased sleepiness per family at bedside. --Neuro recs 4/11: --continue klonipin 0.5mg  TID --continue vimpat 200mg  BID --continue keppra 1500mg  BID --continue fycompa 4mg  daily --dilantin 100mg  TID, f/u dilantin levels --continue Ativan 2 mg q4  PRN --d/c long term EEG --continue IV Solu-Medrol 100 mg x 3 days (ending 4/12) for anti-inflammatory effect, then prednisone taper (start at 60mg , dec by 10mg  q week) --Appreciate palliative recs --Seizure precautions -- Continuous cardiac monitoring -- PT/OT eval and treat -- AM CBC, BMP, Mg Hypotension Likely in the setting of status epilepticus.  Required levophed drip in ICU. Some hypotension overnight with MAP>65. improved with 500 cc fluid bolus. -- Maintenance fluids at 100 mL/h for 10 hours, will reassess in AM -- can consider midodrine if HoTN worsens --Continue to monitor vitals per unit -- AM BMP, Mg as above Left arm swelling Edema from just distal to shoulder down to hand. Noted on exam by Dr Lum Babe. Pt reports pain, limited mobility in LUE. Family member reports pt fell at home recently. Differential includes UE DVT, thrombophlebitis, biceps tear/tendon rupture -- POCUS of LUE -- duplex DVT LUE -- DG L shoulder Chronic health problem HLD: Lipitor 40 mg daily DM2: sensitive SSI, CBG checks QID GERD: Protonix 40 mg daily  FEN/GI: Regular PPx: Lovenox Dispo: Pending clinical improvement  Subjective:  Patient sitting up in bed eating breakfast, reports some abdominal pain but denies pain elsewhere. Patient falls asleep during interview, Aunt at bedside states pt has been more sleepy and this is the only difference she has noticed.  Objective: Temp:  [97 F (36.1 C)-99 F (37.2 C)] 97.8 F (36.6 C) (04/11 0407) Pulse Rate:  [79-108] 94 (04/11 0442) Resp:  [15-22] 18 (04/11 0442) BP: (77-118)/(52-85) 95/53 (04/11 0600) SpO2:  [90 %-100 %] 99 % (04/11 0442) Weight:  [71.3 kg] 71.3 kg (04/10 1836) Physical Exam: General: Sitting up in bed eating breakfast, falls asleep during interview but opens eyes and responds to  voice. In NAD Cardiovascular: RRR, normal S1/S2, no m/r/g Respiratory: CTAB, normal WOB on RA Abdomen: normoactive bowel sounds, soft, nontender,  nondistended Extremities: no edema to BLE Neuro: oriented to self and location  Laboratory: Most recent CBC Lab Results  Component Value Date   WBC 8.7 11/20/2023   HGB 8.5 (L) 11/20/2023   HCT 28.1 (L) 11/20/2023   MCV 102.2 (H) 11/20/2023   PLT 391 11/20/2023   Most recent BMP    Latest Ref Rng & Units 11/20/2023    3:00 AM  BMP  Glucose 70 - 99 mg/dL 161   BUN 6 - 20 mg/dL 6   Creatinine 0.96 - 0.45 mg/dL 4.09   Sodium 811 - 914 mmol/L 142   Potassium 3.5 - 5.1 mmol/L 4.9   Chloride 98 - 111 mmol/L 116   CO2 22 - 32 mmol/L 16   Calcium 8.9 - 10.3 mg/dL 8.1     Other pertinent labs Phenytoin level: 14  Imaging/Diagnostic Tests: EEG 4/11 This study showed evidence of electrographic status epilepticus arising from right frontoparietal onset.   Lorayne Bender, MD 11/23/2023, 7:05 AM  PGY-1, [facility] Family Medicine FPTS Intern pager: [phone], text pages welcome Secure chat group [facility] Teaching Service

## 2023-11-23 NOTE — Plan of Care (Addendum)
 No acute changes, off EEG, on seizure precaution.  Yellow MEWS for BP and HR. MD made aware. Ongoing LR @100 .   Problem: Education: Goal: Knowledge of General Education information will improve Description: Including pain rating scale, medication(s)/side effects and non-pharmacologic comfort measures 11/23/2023 0733 by Ileana Roup, RN Outcome: Not Met (add Reason) 11/22/2023 1847 by Ileana Roup, RN Outcome: Not Met (add Reason)   Problem: Clinical Measurements: Goal: Ability to maintain clinical measurements within normal limits will improve 11/23/2023 0733 by Ileana Roup, RN Outcome: Not Met (add Reason) 11/22/2023 1847 by Ileana Roup, RN Outcome: Not Met (add Reason) Goal: Will remain free from infection 11/23/2023 0733 by Ileana Roup, RN Outcome: Not Met (add Reason) 11/22/2023 1847 by Ileana Roup, RN Outcome: Not Met (add Reason) Goal: Diagnostic test results will improve 11/23/2023 0733 by Ileana Roup, RN Outcome: Not Met (add Reason) 11/22/2023 1847 by Ileana Roup, RN Outcome: Not Met (add Reason) Goal: Respiratory complications will improve 11/23/2023 0733 by Ileana Roup, RN Outcome: Not Met (add Reason) 11/22/2023 1847 by Ileana Roup, RN Outcome: Not Met (add Reason) Goal: Cardiovascular complication will be avoided 11/23/2023 0733 by Ileana Roup, RN Outcome: Not Met (add Reason) 11/22/2023 1847 by Ileana Roup, RN Outcome: Not Met (add Reason)   Problem: Elimination: Goal: Will not experience complications related to bowel motility 11/23/2023 0733 by Ileana Roup, RN Outcome: Not Met (add Reason) 11/22/2023 1847 by Ileana Roup, RN Outcome: Not Met (add Reason) Goal: Will not experience complications related to urinary retention 11/23/2023 0733 by Ileana Roup, RN Outcome: Not Met (add Reason) 11/22/2023 1847 by Ileana Roup, RN Outcome: Not Met (add Reason)    Problem: Safety: Goal: Ability to remain free from injury will improve 11/23/2023 0733 by Ileana Roup, RN Outcome: Not Met (add Reason) 11/22/2023 1847 by Ileana Roup, RN Outcome: Not Met (add Reason)   Problem: Pain Managment: Goal: General experience of comfort will improve and/or be controlled 11/23/2023 0733 by Ileana Roup, RN Outcome: Not Met (add Reason) 11/22/2023 1847 by Ileana Roup, RN Outcome: Not Met (add Reason)   Problem: Skin Integrity: Goal: Risk for impaired skin integrity will decrease 11/23/2023 0733 by Ileana Roup, RN Outcome: Not Met (add Reason) 11/22/2023 1847 by Ileana Roup, RN Outcome: Not Met (add Reason)

## 2023-11-24 ENCOUNTER — Inpatient Hospital Stay (HOSPITAL_COMMUNITY)

## 2023-11-24 DIAGNOSIS — Z7189 Other specified counseling: Secondary | ICD-10-CM

## 2023-11-24 DIAGNOSIS — M79602 Pain in left arm: Secondary | ICD-10-CM | POA: Diagnosis not present

## 2023-11-24 DIAGNOSIS — G40901 Epilepsy, unspecified, not intractable, with status epilepticus: Secondary | ICD-10-CM | POA: Diagnosis not present

## 2023-11-24 DIAGNOSIS — M7989 Other specified soft tissue disorders: Secondary | ICD-10-CM | POA: Diagnosis not present

## 2023-11-24 DIAGNOSIS — Z515 Encounter for palliative care: Secondary | ICD-10-CM

## 2023-11-24 LAB — CBC
HCT: 24.2 % — ABNORMAL LOW (ref 36.0–46.0)
Hemoglobin: 7.5 g/dL — ABNORMAL LOW (ref 12.0–15.0)
MCH: 32.1 pg (ref 26.0–34.0)
MCHC: 31 g/dL (ref 30.0–36.0)
MCV: 103.4 fL — ABNORMAL HIGH (ref 80.0–100.0)
Platelets: 432 10*3/uL — ABNORMAL HIGH (ref 150–400)
RBC: 2.34 MIL/uL — ABNORMAL LOW (ref 3.87–5.11)
RDW: 17.4 % — ABNORMAL HIGH (ref 11.5–15.5)
WBC: 7.7 10*3/uL (ref 4.0–10.5)
nRBC: 0 % (ref 0.0–0.2)

## 2023-11-24 LAB — GLUCOSE, CAPILLARY
Glucose-Capillary: 81 mg/dL (ref 70–99)
Glucose-Capillary: 64 mg/dL — ABNORMAL LOW (ref 70–99)
Glucose-Capillary: 77 mg/dL (ref 70–99)
Glucose-Capillary: 146 mg/dL — ABNORMAL HIGH (ref 70–99)
Glucose-Capillary: 167 mg/dL — ABNORMAL HIGH (ref 70–99)

## 2023-11-24 LAB — BASIC METABOLIC PANEL WITH GFR
Anion gap: 9 (ref 5–15)
BUN: 5 mg/dL — ABNORMAL LOW (ref 6–20)
CO2: 22 mmol/L (ref 22–32)
Calcium: 8.3 mg/dL — ABNORMAL LOW (ref 8.9–10.3)
Chloride: 111 mmol/L (ref 98–111)
Creatinine, Ser: 0.96 mg/dL (ref 0.44–1.00)
GFR, Estimated: >60 mL/min (ref >60)
Glucose, Bld: 157 mg/dL — ABNORMAL HIGH (ref 70–99)
Potassium: 3.4 mmol/L — ABNORMAL LOW (ref 3.5–5.1)
Sodium: 142 mmol/L (ref 135–145)

## 2023-11-24 LAB — MAGNESIUM: Magnesium: 1.4 mg/dL — ABNORMAL LOW (ref 1.7–2.4)

## 2023-11-24 MED ORDER — MAGNESIUM SULFATE 2 GM/50ML IV SOLN
2.0000 g | Freq: Once | INTRAVENOUS | Status: DC
  Filled 2023-11-24: qty 50

## 2023-11-24 MED ORDER — LACTATED RINGERS IV SOLN: INTRAVENOUS | Status: AC

## 2023-11-24 MED ORDER — POTASSIUM CHLORIDE 20 MEQ PO PACK
40.0000 meq | PACK | Freq: Once | ORAL | Status: AC
  Administered 2023-11-24: 40 meq via ORAL
  Filled 2023-11-24: qty 2

## 2023-11-24 MED ORDER — PREDNISONE 20 MG PO TABS
60.0000 mg | ORAL_TABLET | Freq: Every day | ORAL | Status: DC
  Administered 2023-11-25 – 2023-11-26 (×2): 60 mg via ORAL
  Filled 2023-11-24 (×2): qty 3

## 2023-11-24 NOTE — Progress Notes (Signed)
 Patient eating and drinking lunch tray. Repeat BGL is 77 and continues to eat. Will continue to monitor.

## 2023-11-24 NOTE — Assessment & Plan Note (Addendum)
 Intermittent lower MAPS, asymptomatic. Likely in the setting of status epilepticus, also may be secondary to dilantin initiation. -- Maintenance fluids at 100 mL/h for 10 hours -- AM BMP, Mg as above

## 2023-11-24 NOTE — Progress Notes (Signed)
 Physical Therapy Treatment Patient Details Name: Patricia White MRN: [id number] DOB: 04/26/1971 Today's Date: 11/24/2023   History of Present Illness Pt is a 52 y/o F presenting to ED on 4/7 with status epilepticus. PMH includes cerebral palsy with L hemibody weakness (UE>LE), GERD, seizures on depatoke, HTN, HLD.    PT Comments  Pt with good progress today.  She was motivated to get OOB to use BSC.  Required min A bed mobility and min A (had +2 for safety) with standing and transferring to/from [facility].  Pt did require assist of 2 for ADLs (assist to balance and assist for ADLs).  Performed multiple STS during session.  With pt's cognitive hx/CP agree that pt would likely do best in home setting if family comfortable with level of support needed - Aunt Patricia White reports they are able to provide assist.  Do recommend HHPT at d/c.  Pt does have stairs to enter home - may need transportation services home; aunt reports trying to get ramp.    If plan is discharge home, recommend the following: A lot of help with walking and/or transfers;A lot of help with bathing/dressing/bathroom;Help with stairs or ramp for entrance   Can travel by private vehicle        Equipment Recommendations  Other (comment);BSC/3in1 (Transport chair; potential transportation home (has stairs))    Recommendations for Other Services       Precautions / Restrictions Precautions Precautions: Fall Recall of Precautions/Restrictions: Impaired Precaution/Restrictions Comments: seizure; L hemiparesis (congenital CP) Other Brace: LLE AFO Restrictions Weight Bearing Restrictions Per Provider Order: No     Mobility  Bed Mobility Overal bed mobility: Needs Assistance Bed Mobility: Supine to Sit, Sit to Supine     Supine to sit: Min assist Sit to supine: Min assist   General bed mobility comments: Pt motivated to get up to [facility] for BM. Light min A to lift trunk to sit and light min A for L LE back to bed     Transfers Overall transfer level: Needs assistance Equipment used: 1 person hand held assist Transfers: Sit to/from Stand, Bed to chair/wheelchair/BSC Sit to Stand: Min assist, +2 safety/equipment   Step pivot transfers: Min assist, +2 safety/equipment       General transfer comment: Had +2 present for safety but pt initiating transfers and performing with min A and cues.  Stood from bed x 2 and BSC x 1.  PIvoted to and from [facility] reaching to armrest or bed rail with R UE.  Min A to steady.  Did require min A to steady and total assist for pericare (+2 for ADLs)    Ambulation/Gait               General Gait Details: pt only willing to attempt transfers   Stairs             Wheelchair Mobility     Tilt Bed    Modified Rankin (Stroke Patients Only)       Balance Overall balance assessment: Needs assistance Sitting-balance support: Feet supported, No upper extremity supported Sitting balance-Leahy Scale: Fair Sitting balance - Comments: close supervision   Standing balance support: Single extremity supported Standing balance-Leahy Scale: Poor Standing balance comment: R UE support on arm rest/RW/HHA and then min A from therapist; did fatigue easily                            Communication Communication Factors Affecting Communication: Reduced clarity  of speech;Difficulty expressing self  Cognition Arousal: Alert Behavior During Therapy: Impulsive   PT - Cognitive impairments: History of cognitive impairments, Awareness, Initiation, Sequencing, Problem solving, Safety/Judgement                       PT - Cognition Comments: Pt motivated to get up to [facility] today - participating well with therapy but does need multimodal cues        Cueing Cueing Techniques: Verbal cues, Gestural cues, Tactile cues, Visual cues  Exercises      General Comments General comments (skin integrity, edema, etc.): Spoke with aunt, [name], who was present  during session.  She does feel pt will do better at home compared to rehab and feels pt will move better once home. PT agrees with pt's cognitive deficits that home is best if family can provide support.  Patricia White feels confident that they can provide support. Did discuss good progress today but pt still below baseline - would recommend w/c, bsc, and potential transport home      Pertinent Vitals/Pain Pain Assessment Pain Assessment: No/denies pain    Home Living                          Prior Function            PT Goals (current goals can now be found in the care plan section) Progress towards PT goals: Progressing toward goals    Frequency    Min 2X/week      PT Plan      Co-evaluation              AM-PAC PT "6 Clicks" Mobility   Outcome Measure  Help needed turning from your back to your side while in a flat bed without using bedrails?: A Little Help needed moving from lying on your back to sitting on the side of a flat bed without using bedrails?: A Little Help needed moving to and from a bed to a chair (including a wheelchair)?: A Lot Help needed standing up from a chair using your arms (e.g., wheelchair or bedside chair)?: A Lot Help needed to walk in hospital room?: Total Help needed climbing 3-5 steps with a railing? : Total 6 Click Score: 12    End of Session Equipment Utilized During Treatment: Gait belt Activity Tolerance: Patient tolerated treatment well;Other (comment) (some limitations by cognition) Patient left: in bed;with call bell/phone within reach;with bed alarm set;with family/visitor present Nurse Communication: Mobility status PT Visit Diagnosis: Other abnormalities of gait and mobility (R26.89);Hemiplegia and hemiparesis;Muscle weakness (generalized) (M62.81) Hemiplegia - Right/Left: Left Hemiplegia - dominant/non-dominant: Non-dominant Hemiplegia - caused by: Unspecified     Time: 1610-9604 PT Time Calculation (min) (ACUTE  ONLY): 30 min  Charges:    $Therapeutic Activity: 23-37 mins PT General Charges $$ ACUTE PT VISIT: 1 Visit                     Cyd Dowse, PT Acute Rehab [facility] Rehab [phone]    Carolynn Citrin 11/24/2023, 3:05 PM

## 2023-11-24 NOTE — Assessment & Plan Note (Signed)
 HLD: Lipitor 40 mg daily DM2: sensitive SSI, CBG checks QID GERD: Protonix 40 mg daily

## 2023-11-24 NOTE — Consult Note (Signed)
 Palliative Care Consult Note                                  Date: 11/24/2023   Patient Name: Patricia White  DOB: [birth year]  MRN: [id number]  Age / Sex: 53 y.o., female  PCP: Ivin Marrow, MD Referring Physician: Arn Lane, MD  Reason for Consultation: Establishing goals of care  HPI/Patient Profile: 53 y.o. female  with past medical history of cerebral palsy with left hemiparesis, moderate intellectual disability, and GERD admitted on 11/19/2023 with four witnessed seizures at home and found to be in status epilepticus. Patient was in ICU and on pressors but is now on the floor.  She is familiar to the palliative medicine team.  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  I introduced Palliative Medicine as specialized medical care for people living with serious illness. It focuses on providing relief from symptoms and stress of a serious illness. The goal is to improve quality of life for both the patient and the family.  Today's Discussion: Patient is at bedside sleeping. She is in NAD and awakens when I enter the room. She quickly falls back to sleep. Patient's aunt [name] is at bedside. We discuss the patient's current hospitalization.   [name] shares that the patient lives with her and the patient's grandmother Daylin Eads. Robley Chow is the patient's proxy decision maker. After the last hospitalization the patient went home and fell back into her usual routine. She is able to feed and dress herself. Her aunt and grandmother help her with  her IADLs. Her appetite was improved from the last hospitalization.   A discussion was had today regarding advanced directives. We discussed code status. The patient has been DNR/DNI for some time but changed to full code this admission. [name] confirms they would like the patient to be DNR/DNI-- We called [name] by phone to confirm. Patient changed to DNR/DNI. We discussed outpatient palliative services but they are not interested at this time. They are hopeful to get the patient home with [facility] services.  Discussed the importance of continued conversation with family and the medical providers regarding overall plan of care and treatment options, ensuring decisions are within the context of the patient's values and GOCs.  Questions and concerns were addressed. The family was encouraged to call with questions or concerns. PMT will continue to support holistically.  Review of Systems  Unable to perform ROS   Objective:   Primary Diagnoses: Present on Admission:  Status epilepticus [facility])   Physical Exam Vitals reviewed.  Constitutional:      General: She is sleeping. She is not in acute distress. Cardiovascular:     Rate and Rhythm: Normal rate.  Pulmonary:     Effort:  Pulmonary effort is normal.  Neurological:     Mental Status: She is easily aroused.  Psychiatric:        Mood and Affect: Mood normal.        Behavior: Behavior normal.     Vital Signs:  BP 95/68 (BP Location: Left Arm)   Pulse 100   Temp 97.7 F (36.5 C) (Oral)   Resp 18   Ht 5\' 5"  (1.651 m)   Wt 71.3 kg   SpO2 100%   BMI 26.16 kg/m     Advanced Care Planning:   Existing Vynca/ACP Documentation: None  Primary Decision Maker: Juna Oka- Patient's grandmother.  Code Status/Advance Care Planning: DNR   Assessment & Plan:   SUMMARY OF RECOMMENDATIONS   Changed to DNR/DNI Continue treating the treatable Discharge home with [facility] PMT will continue to follow peripherally    Discussed  with: attending team  Time Total: 75 minutes    Thank you for allowing us  to participate in the care of VERONIA LAPRISE PMT will continue to support holistically.   Signed by: Joaquim Muir, NP Palliative Medicine Team  Team Phone # [phone] (Nights/Weekends)  11/24/2023, 11:12 AM

## 2023-11-24 NOTE — Progress Notes (Signed)
 Patient complained of pain to PIV site to right forearm. Site assessed and was infiltrated. IV gtt stopped and site discontinued as noted. No s/s phlebitis but found approximately 2 inch diameter edema just proximal to insertion site. Direct pressure applied till no s/s hemorrhage. Denies any further discomfort after site discontinued.  Unable to find a suitable alternate site and IV team is consulted. Pharmacist Armanda Bern, [facility] notified in central pharmacy and was advised there are no more interventions necessary as the solution infusing was only LR.  Caregiver at bedside updated.

## 2023-11-24 NOTE — Progress Notes (Signed)
 PT Cancellation Note  Patient Details Name: Patricia White MRN: [id number] DOB: [birth year]   Cancelled Treatment:    Reason Eval/Treat Not Completed: Other (comment)  Pt's lunch just arrived.   RN reports pt hypoglycemic.  Will f/u after lunch. Carolan Avedisian, PT Acute Rehab [facility] [phone]  Carolynn Citrin 11/24/2023, 12:50 PM

## 2023-11-24 NOTE — Progress Notes (Signed)
 BP 82/54, MAP 62. Pt asymptomatic, no change in mental status. Ival Marines, MD made aware.  Fluids reordered, recheck BP in an hour per Ival Marines, MD.

## 2023-11-24 NOTE — Plan of Care (Signed)

## 2023-11-24 NOTE — Progress Notes (Signed)
 Daily Progress Note Intern Pager: [phone]  Patient name: Patricia White Medical record number: [id number] Date of birth: 10/23/1970 Age: 53 y.o. Gender: female  Primary Care Provider: Celine Mans, MD Consultants: Palliative Care, Neurology Code Status: Full  Pt Overview and Major Events to Date:  4/7: admitted to ICU in status epilepticus 4/8: code status changed from DNR/DNI to Full code 4/11: transferred to FMTS  Assessment and Plan:  Patricia White is 53 y.o. female admitted for status epilepticus now control on multiple antiepileptics. Pertinent PMH/PSH includes Epilepsy, Cerebral palsy, Left hemiparesis, Moderate ID.  Assessment & Plan Status epilepticus (HCC) Mental status greatly improved. Palliative discussion regarding GOC and code status pending.  --Neuro recs --continue klonipin 0.5mg  TID --continue vimpat 200mg  BID --continue keppra 1500mg  BID --continue fycompa 4mg  daily --dilantin 100mg  TID --continue Ativan 2 mg q4 PRN --continue IV Solu-Medrol 100 mg x 3 days (ending 4/12) for anti-inflammatory effect, then prednisone taper (start at 60mg , dec by 10mg  q week) -- Appreciate palliative recs -- Seizure precautions -- Continuous cardiac monitoring -- PT/OT eval and treat -- AM CBC, BMP, Mg Hypotension Intermittent lower MAPS, asymptomatic. Likely in the setting of status epilepticus, also may be secondary to dilantin initiation. -- Maintenance fluids at 100 mL/h for 10 hours -- AM BMP, Mg as above Left arm swelling XR shows healed humeral fracture. Veinous duplex limited, but negative for DVT. Limited US shows bicep tendinitis and possible partial tear. Unlikely to explain left arm firm swelling at antecubital fossa. --Elbow XR today --Non-vascular ultrasound of antecubital fossa --Tylenol for pain Chronic health problem HLD: Lipitor 40 mg daily DM2: sensitive SSI, CBG checks QID GERD: Protonix 40 mg daily   FEN/GI: Regular PPx:  Lovenox Dispo: Pending clinical improvement  Subjective:  No acute events overnight.  Patient states she wants to drink soda but does not want any of the potatoes or only she had this morning.  States she wants pizza.  Assures me she will eat a good lunch.  Patient does note she has some pain in her left arm.  States she does not want to go home.  Objective: Temp:  [97.5 F (36.4 C)-98.4 F (36.9 C)] 97.7 F (36.5 C) (04/12 0802) Pulse Rate:  [89-106] 100 (04/12 0802) Resp:  [18] 18 (04/12 0802) BP: (80-110)/(54-91) 95/68 (04/12 0802) SpO2:  [97 %-100 %] 100 % (04/12 0802) Physical Exam: General: NAD, awake alert Neuro: A&O, awake alert, interactive no visible seizure movements, no focal neurodeficits Cardiovascular: RRR, no murmurs, no peripheral edema Respiratory: normal WOB on room air, CTAB, no wheezes, ronchi or rales Abdomen: soft, NTTP, no rebound or guarding Extremities: Moving all 4 extremities equally, left arm with mild nonpitting edema to distal humerus, mild firmness around the antecubital fossa, no erythema, nontender   Laboratory: Most recent CBC Lab Results  Component Value Date   WBC 6.0 11/23/2023   HGB 8.3 (L) 11/23/2023   HCT 26.8 (L) 11/23/2023   MCV 100.8 (H) 11/23/2023   PLT 465 (H) 11/23/2023   Most recent BMP    Latest Ref Rng & Units 11/23/2023    7:33 AM  BMP  Glucose 70 - 99 mg/dL 85   BUN 6 - 20 mg/dL <5   Creatinine 1.30 - 1.00 mg/dL 8.65   Sodium 784 - 696 mmol/L 143   Potassium 3.5 - 5.1 mmol/L 3.6   Chloride 98 - 111 mmol/L 112   CO2 22 - 32 mmol/L 19   Calcium 8.9 -  10.3 mg/dL 8.8    Phenytoin level  14  Imaging/Diagnostic Tests:  Left Shoulder XR 1. No acute fracture or dislocation. 2. Healed mid left humeral fracture with exuberant callus formation.  Venous Duplex Negative for DVT left UE.  Limited shoulder ultrasound: Mild hypoechoic changes surrounding the proximal biceps tendon.  Small hypoechoic changes within the  biceps tendon in SAX.  Exam limited secondary to patient's left-sided hemiparesis and inability to externally rotate shoulder.  Additionally given quality of portable butterfly for MSK ultrasound unable to visualize biceps tendon and LAX at the distal insertion. Impression: Biceps tenosynovitis, with possible partial tear versus intratendinous tendinitis    Patricia Marrow, MD 11/24/2023, 8:31 AM  PGY-2, [facility] FPTS Intern pager: [phone], text pages welcome Secure chat group [facility] Teaching Service

## 2023-11-24 NOTE — Progress Notes (Signed)
 New imminent D/C order received today, pt was seen on 4/9 and 4/11 with HHOT as recommendation. We have her on our caseload to see this coming Tuesday--we will see her then unless PT is not seeing her Monday then we will see her at this time. We try our best to alternate days of therapy with PT to be able to give more daily therapy (M-F),  Merryl Abraham OT Acute Rehabilitation Services Office [phone]

## 2023-11-24 NOTE — Assessment & Plan Note (Addendum)
 XR shows healed humeral fracture. Veinous duplex limited, but negative for DVT. Limited US  shows bicep tendinitis and possible partial tear. Unlikely to explain left arm firm swelling at antecubital fossa. --Elbow XR today --Non-vascular ultrasound of antecubital fossa --Tylenol for pain

## 2023-11-24 NOTE — Assessment & Plan Note (Addendum)
 Mental status greatly improved. Palliative discussion regarding GOC and code status pending.  --Neuro recs --continue klonipin 0.5mg  TID --continue vimpat 200mg  BID --continue keppra 1500mg  BID --continue fycompa 4mg  daily --dilantin 100mg  TID --continue Ativan 2 mg q4 PRN --continue IV Solu-Medrol 100 mg x 3 days (ending 4/12) for anti-inflammatory effect, then prednisone taper (start at 60mg , dec by 10mg  q week) -- Appreciate palliative recs -- Seizure precautions -- Continuous cardiac monitoring -- PT/OT eval and treat -- AM CBC, BMP, Mg

## 2023-11-25 ENCOUNTER — Inpatient Hospital Stay (HOSPITAL_COMMUNITY)

## 2023-11-25 DIAGNOSIS — G40901 Epilepsy, unspecified, not intractable, with status epilepticus: Secondary | ICD-10-CM | POA: Diagnosis not present

## 2023-11-25 LAB — GLUCOSE, CAPILLARY
Glucose-Capillary: 139 mg/dL — ABNORMAL HIGH (ref 70–99)
Glucose-Capillary: 78 mg/dL (ref 70–99)
Glucose-Capillary: 187 mg/dL — ABNORMAL HIGH (ref 70–99)
Glucose-Capillary: 188 mg/dL — ABNORMAL HIGH (ref 70–99)

## 2023-11-25 LAB — BASIC METABOLIC PANEL WITH GFR
Anion gap: 11 (ref 5–15)
BUN: 7 mg/dL (ref 6–20)
CO2: 22 mmol/L (ref 22–32)
Calcium: 8.4 mg/dL — ABNORMAL LOW (ref 8.9–10.3)
Chloride: 110 mmol/L (ref 98–111)
Creatinine, Ser: 0.95 mg/dL (ref 0.44–1.00)
GFR, Estimated: >60 mL/min (ref >60)
Glucose, Bld: 150 mg/dL — ABNORMAL HIGH (ref 70–99)
Potassium: 3.5 mmol/L (ref 3.5–5.1)
Sodium: 143 mmol/L (ref 135–145)

## 2023-11-25 LAB — CBC
HCT: 23.9 % — ABNORMAL LOW (ref 36.0–46.0)
Hemoglobin: 7.2 g/dL — ABNORMAL LOW (ref 12.0–15.0)
MCH: 31.2 pg (ref 26.0–34.0)
MCHC: 30.1 g/dL (ref 30.0–36.0)
MCV: 103.5 fL — ABNORMAL HIGH (ref 80.0–100.0)
Platelets: 449 10*3/uL — ABNORMAL HIGH (ref 150–400)
RBC: 2.31 MIL/uL — ABNORMAL LOW (ref 3.87–5.11)
RDW: 17.2 % — ABNORMAL HIGH (ref 11.5–15.5)
WBC: 8.5 10*3/uL (ref 4.0–10.5)
nRBC: 0.5 % — ABNORMAL HIGH (ref 0.0–0.2)

## 2023-11-25 LAB — MAGNESIUM: Magnesium: 1.4 mg/dL — ABNORMAL LOW (ref 1.7–2.4)

## 2023-11-25 MED ORDER — MAGNESIUM SULFATE 2 GM/50ML IV SOLN
2.0000 g | Freq: Once | INTRAVENOUS | Status: AC
  Administered 2023-11-25: 2 g via INTRAVENOUS
  Filled 2023-11-25: qty 50

## 2023-11-25 NOTE — Plan of Care (Signed)

## 2023-11-25 NOTE — Assessment & Plan Note (Addendum)
?    In the setting of IV infiltration.  X-ray/venous duplex without acute finding, ultrasound in the antecubital fossa with diffuse subcutaneous edema with hypoechoic tissue posterior to the elbow that could represent an area of phlegmon or developing abscess though reassuringly without fever/leukocytosis. - Consider antibiotics if swelling worsens or infectious symptoms develop --Tylenol for pain, avoid ibuprofen given gastric erosions on prior endoscopy

## 2023-11-25 NOTE — Progress Notes (Signed)
 Daily Progress Note Intern Pager: [phone]  Patient name: Patricia White Medical record number: [id number] Date of birth: 1971/08/01 Age: 53 y.o. Gender: female  Primary Care Provider: Ivin Marrow, MD Consultants: Palliative care, neurology Code Status: Full  Pt Overview and Major Events to Date:  4/7-admitted to ICU and status epilepticus 4/8-CODE STATUS changed from DNR/DNI to full code 4/11-transfer to FMTS 4/12-CODE STATUS changed from full code to DNR/DNI-Limited  Assessment and Plan:  Neila Bally is a 53 year old female admitted for status epilepticus now controlled on multiple antiepileptics though with concern for electroencephalographic status epilepticus. Pertinent PMH/PSH includes epilepsy, cerebral palsy, left hemiparesis.  Assessment & Plan Status epilepticus (HCC) Mental status improved to baseline.  EEG evidence of status epilepticus, though patient reassuringly is nonconvulsive.  She is status post IV steroid course for 3 days.  Palliative discussed goals of care yesterday and switch patient to DNR/DNI with plans to discharge home with home health. -Neurology and palliative care following, appreciate recommendations - Continue AED regimen: Klonopin 0.5 mg 3 times daily, Vimpat 200 mg twice daily, Keppra 1500 mg twice daily, perampanel 4 mg daily, Dilantin 100 mg 3 times daily, Ativan 2 mg every 4 hours as needed -Seizure precautions - Continuous cardiac monitoring - Appreciate PT/OT recommendations - AM CBC, BMP, mag Hypotension BPs intermittently soft, maps have remained stable.  She has received fluids.  Closely monitoring in the setting of Dilantin use. -Consider repeat fluids, transitioning off Dilantin if continues to worsen Left arm swelling ?  In the setting of IV infiltration.  X-ray/venous duplex without acute finding, ultrasound in the antecubital fossa with diffuse subcutaneous edema with hypoechoic tissue posterior to the elbow that could  represent an area of phlegmon or developing abscess though reassuringly without fever/leukocytosis. - Consider antibiotics if swelling worsens or infectious symptoms develop --Tylenol for pain, avoid ibuprofen given gastric erosions on prior endoscopy  FEN/GI: Regular diet PPx: Lovenox Dispo:Home with home health pending clinical improvement .  Subjective:  Feeling member at bedside states she is more with it today, acting like herself, and has been without seizures.  Patient states she continues to have abdominal pain and arm pain.  Objective: Temp:  [98 F (36.7 C)-98.2 F (36.8 C)] 98 F (36.7 C) (04/13 0420) Pulse Rate:  [79-96] 79 (04/13 0420) Resp:  [18] 18 (04/13 0420) BP: (89-121)/(65-77) 121/77 (04/13 0420) SpO2:  [97 %-100 %] 100 % (04/13 0420) Physical Exam: General: Sitting up in bed, conversant, tired appearing, no acute distress Cardiovascular: Regular rate and rhythm without murmurs rubs or gallops Respiratory: Clear to auscultation bilaterally anteriorly without wheezing rales or rhonchi Abdomen: Soft, nondistended, normoactive bowel sounds, no tenderness elicited on my exam throughout Extremities: Moves all extremities grossly equally, stable swelling of left antecubital fossa without tenderness to palpation  Laboratory: Most recent CBC Lab Results  Component Value Date   WBC 7.7 11/24/2023   HGB 7.5 (L) 11/24/2023   HCT 24.2 (L) 11/24/2023   MCV 103.4 (H) 11/24/2023   PLT 432 (H) 11/24/2023   Most recent BMP    Latest Ref Rng & Units 11/24/2023    4:23 PM  BMP  Glucose 70 - 99 mg/dL 147   BUN 6 - 20 mg/dL 5   Creatinine 8.29 - 5.62 mg/dL 1.30   Sodium 865 - 784 mmol/L 142   Potassium 3.5 - 5.1 mmol/L 3.4   Chloride 98 - 111 mmol/L 111   CO2 22 - 32 mmol/L 22   Calcium  8.9 - 10.3 mg/dL 8.3     Dema Filler, MD 11/25/2023, 8:12 AM  PGY-2, [facility] Family Medicine FPTS Intern pager: [phone], text pages welcome Secure chat group [facility] Teaching Service

## 2023-11-25 NOTE — Assessment & Plan Note (Signed)
 Mental status improved to baseline.  EEG evidence of status epilepticus, though patient reassuringly is nonconvulsive.  She is status post IV steroid course for 3 days.  Palliative discussed goals of care yesterday and switch patient to DNR/DNI with plans to discharge home with home health. -Neurology and palliative care following, appreciate recommendations - Continue AED regimen: Klonopin 0.5 mg 3 times daily, Vimpat 200 mg twice daily, Keppra 1500 mg twice daily, perampanel 4 mg daily, Dilantin 100 mg 3 times daily, Ativan 2 mg every 4 hours as needed -Seizure precautions - Continuous cardiac monitoring - Appreciate PT/OT recommendations - AM CBC, BMP, mag

## 2023-11-25 NOTE — Assessment & Plan Note (Signed)
 BPs intermittently soft, maps have remained stable.  She has received fluids.  Closely monitoring in the setting of Dilantin use. -Consider repeat fluids, transitioning off Dilantin if continues to worsen

## 2023-11-25 NOTE — Progress Notes (Addendum)
 PT is recommending a 3-in-1 BSC and a transfer chair. Met with pt and aunt. Pt needs the 3-in-1 BSC but they declined the transfer chair. She doesn't have a preference for a DME agency. Will contact [employer] for referral and she agreed. Contacted Jermaine with [employer] and he accepted the referral.

## 2023-11-26 ENCOUNTER — Other Ambulatory Visit (HOSPITAL_COMMUNITY): Payer: Self-pay

## 2023-11-26 ENCOUNTER — Telehealth (HOSPITAL_COMMUNITY): Payer: Self-pay | Admitting: Pharmacy Technician

## 2023-11-26 ENCOUNTER — Other Ambulatory Visit (HOSPITAL_BASED_OUTPATIENT_CLINIC_OR_DEPARTMENT_OTHER): Payer: Self-pay

## 2023-11-26 DIAGNOSIS — G40901 Epilepsy, unspecified, not intractable, with status epilepticus: Secondary | ICD-10-CM | POA: Diagnosis not present

## 2023-11-26 DIAGNOSIS — G809 Cerebral palsy, unspecified: Secondary | ICD-10-CM | POA: Diagnosis not present

## 2023-11-26 DIAGNOSIS — Z91148 Patient's other noncompliance with medication regimen for other reason: Secondary | ICD-10-CM | POA: Diagnosis not present

## 2023-11-26 DIAGNOSIS — F79 Unspecified intellectual disabilities: Secondary | ICD-10-CM | POA: Diagnosis not present

## 2023-11-26 LAB — BASIC METABOLIC PANEL WITH GFR
Anion gap: 7 (ref 5–15)
BUN: 8 mg/dL (ref 6–20)
CO2: 23 mmol/L (ref 22–32)
Calcium: 8.5 mg/dL — ABNORMAL LOW (ref 8.9–10.3)
Chloride: 112 mmol/L — ABNORMAL HIGH (ref 98–111)
Creatinine, Ser: 0.89 mg/dL (ref 0.44–1.00)
GFR, Estimated: >60 mL/min (ref >60)
Glucose, Bld: 73 mg/dL (ref 70–99)
Potassium: 3.4 mmol/L — ABNORMAL LOW (ref 3.5–5.1)
Sodium: 142 mmol/L (ref 135–145)

## 2023-11-26 LAB — GLUCOSE, CAPILLARY
Glucose-Capillary: 153 mg/dL — ABNORMAL HIGH (ref 70–99)
Glucose-Capillary: 120 mg/dL — ABNORMAL HIGH (ref 70–99)

## 2023-11-26 LAB — CBC
HCT: 28.6 % — ABNORMAL LOW (ref 36.0–46.0)
Hemoglobin: 8.7 g/dL — ABNORMAL LOW (ref 12.0–15.0)
MCH: 31.4 pg (ref 26.0–34.0)
MCHC: 30.4 g/dL (ref 30.0–36.0)
MCV: 103.2 fL — ABNORMAL HIGH (ref 80.0–100.0)
Platelets: 455 10*3/uL — ABNORMAL HIGH (ref 150–400)
RBC: 2.77 MIL/uL — ABNORMAL LOW (ref 3.87–5.11)
RDW: 17.2 % — ABNORMAL HIGH (ref 11.5–15.5)
WBC: 9.5 10*3/uL (ref 4.0–10.5)
nRBC: 0.4 % — ABNORMAL HIGH (ref 0.0–0.2)

## 2023-11-26 LAB — MAGNESIUM: Magnesium: 1.7 mg/dL (ref 1.7–2.4)

## 2023-11-26 MED ORDER — PHENYTOIN 50 MG PO CHEW
100.0000 mg | CHEWABLE_TABLET | Freq: Three times a day (TID) | ORAL | 0 refills | Status: DC
  Filled 2023-11-26: qty 180, 30d supply, fill #0

## 2023-11-26 MED ORDER — PHENYTOIN 50 MG PO CHEW: 100.0000 mg | CHEWABLE_TABLET | Freq: Three times a day (TID) | ORAL | 0 refills | Status: DC

## 2023-11-26 MED ORDER — CLONAZEPAM 0.5 MG PO TABS
0.5000 mg | ORAL_TABLET | Freq: Three times a day (TID) | ORAL | 0 refills | Status: DC
  Filled 2023-11-26: qty 90, 30d supply, fill #0

## 2023-11-26 MED ORDER — LACOSAMIDE 200 MG PO TABS
200.0000 mg | ORAL_TABLET | Freq: Two times a day (BID) | ORAL | 0 refills | Status: DC
  Filled 2023-11-26: qty 60, 30d supply, fill #0

## 2023-11-26 MED ORDER — LEVETIRACETAM 750 MG PO TABS
1500.0000 mg | ORAL_TABLET | Freq: Two times a day (BID) | ORAL | 0 refills | Status: DC
  Filled 2023-11-26: qty 120, 30d supply, fill #0

## 2023-11-26 MED ORDER — PREDNISONE 20 MG PO TABS
ORAL_TABLET | ORAL | 0 refills | Status: DC
  Filled 2023-11-26: qty 71, 41d supply, fill #0

## 2023-11-26 MED ORDER — PERAMPANEL 4 MG PO TABS
4.0000 mg | ORAL_TABLET | Freq: Every day | ORAL | 0 refills | Status: DC
  Filled 2023-11-26: qty 30, 30d supply, fill #0

## 2023-11-26 MED ORDER — PERAMPANEL 4 MG PO TABS: 4.0000 mg | ORAL_TABLET | Freq: Every day | ORAL | 0 refills | Status: DC

## 2023-11-26 MED ORDER — POTASSIUM CHLORIDE CRYS ER 20 MEQ PO TBCR
40.0000 meq | EXTENDED_RELEASE_TABLET | Freq: Once | ORAL | Status: AC
  Administered 2023-11-26: 40 meq via ORAL
  Filled 2023-11-26: qty 2

## 2023-11-26 NOTE — Assessment & Plan Note (Addendum)
 Hyperlipidemia: Lipitor 40 mg daily Type 2 diabetes: Sensitive sliding scale insulin, CBG checks 4 times daily GERD: Protonix 40 mg daily HLD: Lipitor 40 mg daily

## 2023-11-26 NOTE — TOC Transition Note (Addendum)
 Transition of Care [facility]) - Discharge Note   Patient Details  Name: Patricia White MRN: [id number] Date of Birth: 19-Sep-1970  Transition of Care [facility]) CM/SW Contact:  Jonathan Neighbor, RN Phone Number: 11/26/2023, 1:31 PM   Clinical Narrative:     Pt is discharging home with her aunt. Aunt will provide needed transportation home. Transport chair for home ordered through [employer] and will be delivered to the patients home. [facility] home [facility] was set up at last admission and didn't get a visit to the home before readmission. They will see her after dc. They are aware of d/c home today. CM added SW due to potential issues at home. CM has asked RN to make sure and update aunt that one of pts needed medications needs to be picked up on way home. Last visit they may have missed picking up d/c meds.  CM will send in request for home caregivers through pts medicaid. [employer] will call the patients aunt to arrange when this will start and the agency they prefer to use.   1455: Cab voucher provided for transport home.  Final next level of care: Home w Home Health Services Barriers to Discharge: No Barriers Identified   Patient Goals and CMS Choice   CMS Medicare.gov Compare Post Acute Care list provided to:: Patient Represenative (must comment) Choice offered to / list presented to :  (aunt)      Discharge Placement                       Discharge Plan and Services Additional resources added to the After Visit Summary for     Discharge Planning Services: CM Consult            DME Arranged:  (transport chair) DME Agency: [employer] Date DME Agency Contacted: 11/26/23   Representative spoke with at DME Agency: Zack HH Arranged: PT, OT, Social Work [employer] Agency: [facility] Date HH Agency Contacted: 11/26/23   Representative spoke with at [facility] Agency: Renetta Carter  Social Drivers of Health (SDOH) Interventions SDOH Screenings   Food Insecurity: No Food  Insecurity (11/19/2023)  Housing: Low Risk  (11/19/2023)  Transportation Needs: No Transportation Needs (11/19/2023)  Utilities: Not At Risk (11/19/2023)  Alcohol Screen: Low Risk  (03/24/2023)  Depression (PHQ2-9): Low Risk  (08/09/2023)  Financial Resource Strain: Low Risk  (03/24/2023)  Physical Activity: Insufficiently Active (03/24/2023)  Social Connections: Moderately Isolated (11/19/2023)  Stress: No Stress Concern Present (03/24/2023)  Tobacco Use: Low Risk  (11/19/2023)  Health Literacy: Adequate Health Literacy (03/24/2023)     Readmission Risk Interventions    11/16/2023   11:48 AM  Readmission Risk Prevention Plan  Transportation Screening Complete  PCP or Specialist Appt within 5-7 Days Complete  Home Care Screening Complete  Medication Review (RN CM) Complete

## 2023-11-26 NOTE — Progress Notes (Signed)
 CSW met with patient and aunt at bedside to discuss concerns related to possible negligence per MD request. CSW discussed with aunt [name] at bedside about patient's care at home and possible missed medication doses. Per aunt, the patient takes her medicine at home, but they get her medicines through [employer] that delivers the medicines so there may have been a delay in getting the medicines from the last admission if they weren't delivered yet. CSW asked aunt about any other care concerns at home and aunt had nothing to report.  CSW asked aunt to step out to speak with patient alone. CSW discussed with patient about her telling the medical team that she didn't want to go home. Patient said, "that's right". When CSW asked about what was going on at home, patient said that she wanted to see her sisters. CSW engaged in brief discussion with patient about sisters to try to build rapport, and then asked again about concerns at home. Patient continued to state that she wanted to see her sisters and that was her only concern. CSW told patient that she would relay to the aunt about scheduling a visit with the sisters. CSW met with aunt outside the room to ask her to arrange a visit with patient's sisters, as that would likely boost the patient's mood.  CSW discussed with MD about additional concerns about patient possibly not receiving medications from neurology note about seizure medicine levels, as well as unexplained fractures from last admission. CSW called [employer] to provide report for them to investigate any possible negligence happening at home.   Scot Cutter, [place] Clinical Social Worker [phone]

## 2023-11-26 NOTE — Assessment & Plan Note (Addendum)
 Perhaps in the setting of IV infiltration.  Imaging so far showed diffuse edema.  Does appear improved. Ultrasound in the antecubital fossa with diffuse subcutaneous edema with hypoechoic tissue posterior to the elbow that could phlegmon or developing abscess, the patient does not have a leukocytosis and swelling appears to be decreased today.  Patient continues to deny any pain to the area.  - Will continue to monitor and consider antibiotics if no improvement or worsening  - Tylenol as needed for pain

## 2023-11-26 NOTE — Progress Notes (Signed)
    Durable Medical Equipment  (From admission, onward)           Start     Ordered   11/26/23 1321  For home use only DME Other see comment  Once       Comments: Transport chair  Question:  Length of Need  Answer:  [employer]   11/26/23 1320            A cane, crutch or walker will not suffice. A transport chair is needed due to cerebral palsy. Also pt has a caregiver that is able to propel the transport chair.

## 2023-11-26 NOTE — Discharge Summary (Addendum)
 Family Medicine Teaching [facility] Discharge Summary  Patient name: Patricia White Medical record number: [id number] Date of birth: 1971/03/02 Age: 53 y.o. Gender: female Date of Admission: 11/19/2023  Date of Discharge: 11/26/23  Admitting Physician: Lynnell Catalan, MD  Primary Care Provider: Celine Mans, MD Consultants: CCM   Indication for Hospitalization: Status Epilepticus   Discharge Diagnoses/Problem List:  Principal Problem for Admission: Status Epilepticus  Other Problems addressed during stay:  Principal Problem:   Status epilepticus [facility]) Active Problems:   Chronic health problem   Hypotension   Left arm swelling   Left arm pain    Brief Hospital Course:  Patricia White is a 53 y.o.female with a history of cerebral palsy and seizure disordered who was admitted to the ICU at [facility] for status epilepticus. Her hospital course is detailed below:  Status epilepticus Presented to ED after 4 witnessed general tonic clonic seizures and continued LOC.  Known epilepsy, on Depakote 500 mg twice daily and carbamazepine 300 mg twice daily but concern for medication noncompliance given low carbamazepine/valproic acid levels upon admission.  Neurology was consulted, received Keppra load.  Home meds stopped and transitioned to Keppra 1500 mg daily, Vimpat 200 mg BID and Dilantin 100 mg q8hr.  Patient was noted to barely be protecting her airway, given DNR/DNI status further AED escalation was held off and patient was admitted to ICU.  EEGs showed continued status epilepticus, loaded with perampanel, continued on 4 mg daily. Changed to full code per grandmother request.  EEG on 4/9 improved, started on Clonazepam 0.5 mg TID.  EEG on 4/10 unchanged from prior day, started on IV Solu-Medrol x 3 days for anti-inflammatory effect given concern for partial status epilepticus ongoing for weeks. Transferred to FMTS on 4/11. Started prednisone taper at 60mg  daily, titrating down by 10 mg  every week. Palliative care consulted for GOC conversation, pt switched back to DNR/DNI. AED regimen on discharge included  Keppra 1500 mg daily, Vimpat 200 mg twice daily, Dilantin 100 mg every 8 hours,  clonazepam 0.5 mg 3 times daily and Perampanel 4mg  daily.   Hypotension Initially postictal and hypotensive despite IV fluid resuscitation.  Required Levophed to maintain SBP in 90s likely in the setting of status epilepticus as above.  Levophed stopped and 4/8 PM as seizures improved.  Received fluid bolus, maintenance IV fluids for ongoing hypotension once on the floor.IV fluids were stopped and patient continued to do well. Patient does have lower Bps, which seem to be her baseline.   AKI Cr initially elevated to 1.1 from normal baseline, subsequently improved with IV fluid resuscitation.  Macrocytic anemia Chronic, normal folate and elevated B12.  Likely in the setting of chronic malnutrition.  Hemoglobin remained stable with baseline throughout admission.  Left hand swelling  Noticed some left hand swelling but DVT and XR were negative. Xray showed healed humeral fracture. Ultrasound showed bicep tendinitis and possible partial tear. The swelling decreased during admission and patient declined any pain.   Concern for negligence Patient admitted twice in short period of time. Per neurology note, concern that patient had low level of her AEDs and may have been in status epilepticus for a while prior to being admitted to hospital which may have been the reason for ICU stay. Given there was concern for negligence at last admission but ultimately social work did not find any concern, SW was consulted. APS was contacted regarding these concerns.   Other chronic conditions were medically managed with home medications  and formulary alternatives as necessary (HLD, HTN)  PCP Follow-up Recommendations: HTN meds (indapamide and losartan) discontinued given relative hypotension, restart as indicated in  setting of dilantin use Ensure close neurology follow up and medication adherence  Patient had swelling on L hand, please follow up for resolution and consider work up for L bicep tendinitis if patient is symptomatic    Disposition: Home   Discharge Condition: Stable   Discharge Exam:  Vitals:   11/26/23 0839 11/26/23 1150  BP:  100/72  Pulse:  92  Resp: (!) 22 20  Temp:  98.5 F (36.9 C)  SpO2:  100%   General: A&O, NAD HEENT: No sign of trauma, EOM grossly intact Cardiac: RRR, no m/r/g Respiratory: CTAB, normal WOB, no w/c/r GI: soft, normactive bowel sounds  Extremities: mild swelling at left antecubital fossa without TTP    Significant Procedures: None   Significant Labs and Imaging:  Recent Labs  Lab 11/24/23 1623 11/25/23 1032 11/26/23 0629  WBC 7.7 8.5 9.5  HGB 7.5* 7.2* 8.7*  HCT 24.2* 23.9* 28.6*  PLT 432* 449* 455*   Recent Labs  Lab 11/24/23 1623 11/25/23 1032 11/26/23 0629  NA 142 143 142  K 3.4* 3.5 3.4*  CL 111 110 112*  CO2 22 22 23   GLUCOSE 157* 150* 73  BUN 5* 7 8  CREATININE 0.96 0.95 0.89  CALCIUM 8.3* 8.4* 8.5*  MG 1.4* 1.4* 1.7     Pertinent Imaging   L shoulder:  1. No acute fracture or dislocation. 2. Healed mid left humeral fracture with exuberant callus formation.  L elbow: Mild-to-moderate diffuse subcutaneous fat soft tissue swelling and edema. No acute fracture is seen.  Left Upper Extremity Soft Tissue US:  Diffuse subcutaneous edema with more pronounced heterogeneous hypoechoic tissue posterior to the elbow spanning an area measuring approximately 2.2 x 1.6 cm by at least 4 cm. This is nonspecific but could represent an area of phlegmon or developing abscess.  CT L arm: 1. Subcutaneous edema posterolaterally at the elbow with extension into the proximal forearm, consistent with IV infiltration and/or cellulitis. No focal fluid collection identified on noncontrast imaging. 2. No evidence of foreign body or soft  tissue emphysema. 3. No acute osseous findings. Old healed fracture of the mid left humeral shaft.  Results/Tests Pending at Time of Discharge: none  Discharge Medications:  Allergies as of 11/26/2023   No Known Allergies      Medication List     STOP taking these medications    divalproex 250 MG DR tablet Commonly known as: DEPAKOTE   indapamide 1.25 MG tablet Commonly known as: LOZOL   losartan 100 MG tablet Commonly known as: COZAAR   nystatin 100000 UNIT/ML suspension Commonly known as: MYCOSTATIN   TEGretol 200 MG tablet Generic drug: carbamazepine   topiramate 25 MG tablet Commonly known as: TOPAMAX       TAKE these medications    (feeding supplement) PROSource Plus liquid Take 30 mLs by mouth 2 (two) times daily between meals.   atorvastatin 40 MG tablet Commonly known as: LIPITOR TAKE 1 TABLET BY MOUTH DAILY What changed: when to take this   clonazePAM 0.5 MG tablet Commonly known as: KLONOPIN Take 1 tablet (0.5 mg total) by mouth 3 (three) times daily.   famotidine 20 MG tablet Commonly known as: PEPCID Take 1 tablet (20 mg total) by mouth 2 (two) times daily for 14 days. What changed: when to take this   lacosamide 200 MG Tabs tablet  Commonly known as: VIMPAT Take 1 tablet (200 mg total) by mouth 2 (two) times daily.   levETIRAcetam 750 MG tablet Commonly known as: KEPPRA Take 2 tablets (1,500 mg total) by mouth 2 (two) times daily.   pantoprazole 40 MG tablet Commonly known as: PROTONIX Take 1 tablet (40 mg total) by mouth daily.   Perampanel 4 MG Tabs Take 1 tablet (4 mg total) by mouth daily.   phenytoin 50 MG tablet Commonly known as: DILANTIN Chew 2 tablets (100 mg total) by mouth 3 (three) times daily.   predniSONE 20 MG tablet Commonly known as: DELTASONE Take 3 tablets (60 mg total) by mouth daily with breakfast for 6 days, THEN 2.5 tablets (50 mg total) daily with breakfast for 7 days, THEN 2 tablets (40 mg total) daily  with breakfast for 7 days, THEN 1.5 tablets (30 mg total) daily with breakfast for 7 days, THEN 1 tablet (20 mg total) daily with breakfast for 7 days, THEN 0.5 tablets (10 mg total) daily with breakfast for 7 days. Start taking on: November 26, 2023   sucralfate 1 GM/10ML suspension Commonly known as: CARAFATE Take 10 mLs (1 g total) by mouth 4 (four) times daily -  with meals and at bedtime.               Durable Medical Equipment  (From admission, onward)           Start     Ordered   11/26/23 1321  For home use only DME Other see comment  Once       Comments: Transport chair  Question:  Length of Need  Answer:  Lifetime   11/26/23 1320            Discharge Instructions: Please refer to Patient Instructions section of EMR for full details.  Patient was counseled important signs and symptoms that should prompt return to medical care, changes in medications, dietary instructions, activity restrictions, and follow up appointments.   Follow-Up Appointments:  Follow-up Information     [facility] Follow up on 11/28/2023.   Why: at 10:50 AM. Contact information: [address] [place]  [address] [phone]        [facility] Follow up.   Why: [facility] will contact you for the first home visit. Contact information: [phone]                Farris Hong, MD 11/26/2023, 1:50 PM PGY-1, [facility]

## 2023-11-26 NOTE — Telephone Encounter (Signed)
 Pharmacy Patient Advocate Encounter  Insurance verification completed.    The patient is insured through [facility]. Patient has Medicare and is not eligible for a copay card, but may be able to apply for patient assistance or Medicare RX Payment Plan (Patient Must reach out to their plan, if eligible for payment plan), if available.    Ran test claim for LEVETIRACETAM 500MG  TABLETS,LACOSAMIDE 200MG  TABLETS,FYCOMPA 4MG  TABLETS,PHENYTOIN 100MG  CAPSULES,CLONAZEPAM 0.5MG  TABLETS and the current 30 day co-pay is $0.00.   This test claim was processed through [facility]- copay amounts may vary at other pharmacies due to pharmacy/plan contracts, or as the patient moves through the different stages of their insurance plan.

## 2023-11-26 NOTE — Assessment & Plan Note (Addendum)
 Blood pressure appears stable today.  Will continue to closely monitor in setting of Dilantin use

## 2023-11-26 NOTE — Progress Notes (Addendum)
 Subjective: No acute events overnight.  Aunt at bedside.  Patient states she would like to continue to stay at the hospital because there is a TV in her room.  Per aunt there is TV in her room at the house also but she thinks this is a hotel and therefore prefers to continue staying here for now  ROS: negative except above  Examination  Vital signs in last 24 hours: Temp:  [97.5 F (36.4 C)-98.4 F (36.9 C)] 97.7 F (36.5 C) (04/14 0830) Pulse Rate:  [70-100] 73 (04/14 0830) Resp:  [17-22] 22 (04/14 0839) BP: (91-111)/(58-87) 111/87 (04/14 0830) SpO2:  [93 %-100 %] 95 % (04/14 0830)  General: lying in bed, NAD Neuro: MS: Alert, oriented to place and person, not time, follows commands CN: pupils equal and reactive,  EOMI, face symmetric, tongue midline, normal sensation over face, Motor: 5/5 strength in right upper and right lower extremity, 4/5 in left upper extremity/left lower extremity ( left hemiparesis at baseline) Coordination: normal Gait: not tested  Basic Metabolic Panel: Recent Labs  Lab 11/19/23 1345 11/20/23 0300 11/23/23 0733 11/24/23 1623 11/25/23 1032 11/26/23 0629  NA 137 142 143 142 143 142  K 3.4* 4.9 3.6 3.4* 3.5 3.4*  CL 108 116* 112* 111 110 112*  CO2 19* 16* 19* 22 22 23   GLUCOSE 101* 150* 85 157* 150* 73  BUN 7 6 <5* 5* 7 8  CREATININE 1.15* 0.93 0.87 0.96 0.95 0.89  CALCIUM 8.2* 8.1* 8.8* 8.3* 8.4* 8.5*  MG 1.5* 2.1  --  1.4* 1.4* 1.7  PHOS 3.0 3.9  --   --   --   --     CBC: Recent Labs  Lab 11/19/23 1345 11/19/23 1626 11/20/23 0507 11/23/23 0733 11/24/23 1623 11/25/23 1032 11/26/23 0629  WBC 5.3   < > 8.7 6.0 7.7 8.5 9.5  NEUTROABS 3.5  --   --   --   --   --   --   HGB 7.0*   < > 8.5* 8.3* 7.5* 7.2* 8.7*  HCT 23.4*   < > 28.1* 26.8* 24.2* 23.9* 28.6*  MCV 104.0*   < > 102.2* 100.8* 103.4* 103.5* 103.2*  PLT 368   < > 391 465* 432* 449* 455*   < > = values in this interval not displayed.     Coagulation Studies: No results  for input(s): "LABPROT", "INR" in the last 72 hours.  Imaging No new brain imaging overnight   ASSESSMENT AND PLAN: 53 year old female with history of epilepsy, cerebral palsy, mental retardation, recent admission with failure to thrive who was brought in with status epilepticus.  Patient mental status has since improved but EEG continues to show electrographic status epilepticus   Convulsive status epilepticus, resolved Electrographic status epilepticus, refractory - Etiology of status epilepticus: Patient had low levels of Depakote and carbamazepine.  Grandmother states she has been giving patient medications.   Suspect there is some medication noncompliance (of note patient's grandmother is 5 herself).  About 1 year ago Depakote level was 72 and about 2 years ago carbamazepine level was 6.3   Recommendations - Continue prednisone taper, starting at 60 mg and then tapering off by 10 mg every week. - Continue Keppra 1500 mg daily, Vimpat 200 mg twice daily, Dilantin 100 mg every 8 hours,  clonazepam 0.5 mg 3 times daily and Perampanel 4mg  daily -Discussed plan with medicine team via secure chat -Continue seizure precautions - Follow-up with [facility] in 4-6 weeks.  Will likely benefit from repeat routine EEG at that time   I have spent a total of  36 minutes with the patient reviewing hospital notes,  test results, labs and examining the patient as well as establishing an assessment and plan > 50% of time was spent in direct patient care.    Roxy Cordial Epilepsy [facility] For questions after 5pm please refer to AMION to reach the Neurologist on call

## 2023-11-26 NOTE — Progress Notes (Addendum)
 Daily Progress Note Intern Pager: [phone]  Patient name: Patricia White Medical record number: [id number] Date of birth: 17-Sep-1970 Age: 53 y.o. Gender: female  Primary Care Provider: Celine Mans, MD Consultants: Palliative care, Neurology  Code Status: Full   Pt Overview and Major Events to Date:  4/7: admitted to ICU for status epilepticus 4/11: transferred to FMTS 4/12: Code status changed from full to DNR/DNI   Assessment and Plan: Patient is a 53 year old female with past medical history of epilepsy, cerebral palsy that is epileptic is not controlled on multiple antiepileptics. Assessment & Plan Status epilepticus (HCC) Mental status seems to be at baseline today.  Neurology following.   - Neuro and palliative care following, appreciate recs -Continue AED regimen: Keppra 1500 mg daily, Vimpat 200 mg twice daily, Dilantin 100 mg every 8 hours, clonazepam 0.5 mg 3 times daily and as needed Perampanel 4 mg daily - Continue prednisone taper, starting at 60 mg and tapering off by 10 mg every week -Seizure precautions -Appreciate PT/OT recs -AM CBC, BMP, mag - CCM   Hypotension Blood pressure appears stable today.  Will continue to closely monitor in setting of Dilantin use  Left arm swelling Perhaps in the setting of IV infiltration.  Imaging so far showed diffuse edema.  Does appear improved. Ultrasound in the antecubital fossa with diffuse subcutaneous edema with hypoechoic tissue posterior to the elbow that could phlegmon or developing abscess, the patient does not have a leukocytosis and swelling appears to be decreased today.  Patient continues to deny any pain to the area.  - Will continue to monitor and consider antibiotics if no improvement or worsening  - Tylenol as needed for pain Chronic health problem Hyperlipidemia: Lipitor 40 mg daily Type 2 diabetes: Sensitive sliding scale insulin, CBG checks 4 times daily GERD: Protonix 40 mg daily HLD: Lipitor 40 mg  daily  Concern for negligence Patient admitted recently and did have some concern for negligence as patient continued to express that she did not want to go home even though she was medically stable.  Social work and did not feel there was any need PS.  There was believed to be a need for increased aide, which was provided at discharge.  However at time of this admission, it seems that patient did not have adequate levels of AEDs and neurology with concern for epilepticus in the setting of occasion nonadherence.  Patient again expressing that she would not like to go home during this admission.  Consulted social work social work Building services engineer APS report given ongoing concerns.  FEN/GI: Regular PPx: Lovenox Dispo: Home pending social work eval  Subjective:  Patient reports that she would not like to go home today.  She reports that she has abdominal pain and also is complaining that she has "had problems".  She states that she likes being in the hospital for the TV.  When asked where she is having pain, patient unable to localize. Objective: Temp:  [97.4 F (36.3 C)-98.4 F (36.9 C)] 97.8 F (36.6 C) (04/14 0434) Pulse Rate:  [70-100] 80 (04/14 0434) Resp:  [17-18] 18 (04/14 0434) BP: (91-104)/(58-76) 99/76 (04/14 0434) SpO2:  [93 %-100 %] 100 % (04/14 0434) Physical Exam: General: chronically ill, NAD Cardiovascular: RRR Respiratory: NWOB on RA  Abdomen: soft, NTND on exam Extremities: mild edema noted at L elbow   Laboratory: Most recent CBC Lab Results  Component Value Date   WBC 9.5 11/26/2023   HGB 8.7 (L) 11/26/2023  HCT 28.6 (L) 11/26/2023   MCV 103.2 (H) 11/26/2023   PLT 455 (H) 11/26/2023   Most recent BMP    Latest Ref Rng & Units 11/26/2023    6:29 AM  BMP  Glucose 70 - 99 mg/dL 73   BUN 6 - 20 mg/dL 8   Creatinine 6.57 - 8.46 mg/dL 9.62   Sodium 952 - 841 mmol/L 142   Potassium 3.5 - 5.1 mmol/L 3.4   Chloride 98 - 111 mmol/L 112   CO2 22 - 32 mmol/L 23   Calcium  8.9 - 10.3 mg/dL 8.5    Imaging/Diagnostic Tests: No new imaging  Farris Hong, MD 11/26/2023, 8:30 AM  PGY-1, [facility] FPTS Intern pager: [phone], text pages welcome Secure chat group [facility] Teaching Service

## 2023-11-26 NOTE — Discharge Instructions (Addendum)
 Dear Patricia White,  Thank you for letting us  participate in your care. You were hospitalized for seizures and diagnosed with Status epilepticus (HCC). You were admitted to the ICU and your medication was adjusted. Once we were able to get your seizures under control, we were able to discharge you with a new medication regimen.  POST-HOSPITAL & CARE INSTRUCTIONS You will need close follow up with your neurologist and PCP.  Follow-up with [facility] in 4-6 weeks  Go to your follow up appointments (listed below)   DOCTOR'S APPOINTMENT   Future Appointments  Date Time Provider Department Center  11/28/2023 10:50 AM ACCESS TO CARE POOL [facility]  03/24/2024  1:40 PM [facility] ANNUAL WELLNESS VISIT [facility]    Follow-up Information     [facility] Follow up on 11/28/2023.   Why: at 10:50 AM. Contact information: [address] [place]  [phone]                Take care and be well!  Family Medicine Teaching Service Inpatient Team [place]  [facility]  [address] [place], [place] [address] [phone]

## 2023-11-26 NOTE — Progress Notes (Signed)
 [facility] Liaison Note  11/26/2023  Patricia White 1970/11/19 [id number]  Location: RN Hospital Liaison screened the patient remotely at [facility].  Insurance: [employer]   [name] is a 53 y.o. female who is a Primary Care Patient of Ivin Marrow, MD [facility]. The patient was screened for readmission hospitalization with noted high risk score for unplanned readmission risk with 2 IP/1 ED in 6 months.  The patient was assessed for potential Care Management service needs for post hospital transition for care coordination. Review of patient's electronic medical record reveals patient was admitted for Status Epilepticus. Pt will discharged home with [facility] in place. No anticipated needs for VBCI to address.   Plan: [facility] Liaison will continue to follow progress and disposition to asess for post hospital community care coordination/management needs.  Referral request for community care coordination: anticipate Transitions of Care Team follow up.   VBCI Care Management/Population Health does not replace or interfere with any arrangements made by the Inpatient Transition of Care team.   For questions contact:   Lilla Reichert, RN, BSN Hospital Liaison [place]   [facility], Population Health Office Hours MTWF  8:00 am-6:00 pm Direct Dial: [phone] [email or web address]

## 2023-11-26 NOTE — Progress Notes (Signed)
 Physical Therapy Treatment Patient Details Name: Patricia White MRN: [id number] DOB: 17-Nov-1970 Today's Date: 11/26/2023   History of Present Illness Pt is a 53 y/o F presenting to ED on 4/7 with status epilepticus. PMH includes cerebral palsy with L hemibody weakness (UE>LE), GERD, seizures on depatoke, HTN, HLD.    PT Comments  Patient resting in bed at start, c/o abdominal discomfort relating to constipation despite BM this AM and headache. Pt required max encouragement to participate in mobility, express concerns regarding return home. Pt ultimately required min-mod assist to move supine>sit EOB and Mod assist to complete sit<>stand with 1HHA. Pt unsteady and did not achieve full upright posture despite cues and support. Pt's aunt present and verbalized the family is comfortable providing assist level needed for pt. Pt expresses concerns stating "she doesn't understand" and "they don't understand" regarding her concerns with returning home. Pt expresses interest in a home aid to assist with care and needs. EOS pt returned to supine and adjusted to partial chair position in bed. Will continue to progress pt as able.     If plan is discharge home, recommend the following: A lot of help with walking and/or transfers;A lot of help with bathing/dressing/bathroom;Help with stairs or ramp for entrance   Can travel by private vehicle        Equipment Recommendations  Other (comment);BSC/3in1 (transport chair and medical/safe transport home due to stairs to enter)    Recommendations for Other Services       Precautions / Restrictions Precautions Precautions: Fall Recall of Precautions/Restrictions: Impaired Precaution/Restrictions Comments: bowel incontinence, seizure precs, L hemiparesis (congenital CP) Required Braces or Orthoses: Other Brace Other Brace: LLE AFO Restrictions Weight Bearing Restrictions Per Provider Order: No     Mobility  Bed Mobility Overal bed mobility: Needs  Assistance Bed Mobility: Supine to Sit, Sit to Supine     Supine to sit: Min assist, Mod assist Sit to supine: Mod assist   General bed mobility comments: min assist to pivot/turn and raise trunk upright ot EOB, mod assist to scoot anterior and place feet on floor. Mod assist to maintain seated balance at time with flexed posture.    Transfers Overall transfer level: Needs assistance Equipment used: 1 person hand held assist Transfers: Sit to/from Stand Sit to Stand: Mod assist           General transfer comment: Mod 1HHA for power up from EOB, pt completd 5x partial stands with cue to release Rt UE from EOB to stand fully. not successful.    Ambulation/Gait                   Stairs             Wheelchair Mobility     Tilt Bed    Modified Rankin (Stroke Patients Only)       Balance Overall balance assessment: Needs assistance Sitting-balance support: Feet supported, No upper extremity supported Sitting balance-Leahy Scale: Fair   Postural control: Left lateral lean, Right lateral lean Standing balance support: Single extremity supported Standing balance-Leahy Scale: Poor Standing balance comment: reliant on external support                            Communication Communication Communication: Impaired  Cognition Arousal: Alert Behavior During Therapy: Impulsive   PT - Cognitive impairments: History of cognitive impairments, Awareness, Initiation, Sequencing, Problem solving, Safety/Judgement Difficult to assess due to: Impaired communication  PT - Cognition Comments: pt wanting to remain in bed and required max encouragement to participate. motivated to sit up to drink cola but upon sitting EOB placed cola on table. Following commands: Impaired Following commands impaired: Follows one step commands inconsistently, Follows one step commands with increased time    Cueing Cueing Techniques: Verbal cues,  Gestural cues, Tactile cues, Visual cues  Exercises      General Comments        Pertinent Vitals/Pain Pain Assessment Pain Assessment: Faces Faces Pain Scale: Hurts little more Pain Location: stomach and head Pain Descriptors / Indicators: Discomfort Pain Intervention(s): Limited activity within patient's tolerance, Monitored during session, Repositioned    Home Living                          Prior Function            PT Goals (current goals can now be found in the care plan section) Acute Rehab PT Goals Patient Stated Goal: To go home with an aid PT Goal Formulation: With patient/family Time For Goal Achievement: 12/05/23 Potential to Achieve Goals: Fair Progress towards PT goals: Progressing toward goals (slow)    Frequency    Min 2X/week      PT Plan      Co-evaluation              AM-PAC PT "6 Clicks" Mobility   Outcome Measure  Help needed turning from your back to your side while in a flat bed without using bedrails?: A Little Help needed moving from lying on your back to sitting on the side of a flat bed without using bedrails?: A Little Help needed moving to and from a bed to a chair (including a wheelchair)?: A Lot Help needed standing up from a chair using your arms (e.g., wheelchair or bedside chair)?: A Lot Help needed to walk in hospital room?: Total Help needed climbing 3-5 steps with a railing? : Total 6 Click Score: 12    End of Session Equipment Utilized During Treatment: Gait belt Activity Tolerance: Patient tolerated treatment well (limited by cog) Patient left: in bed;with call bell/phone within reach;with bed alarm set;with family/visitor present Nurse Communication: Mobility status PT Visit Diagnosis: Other abnormalities of gait and mobility (R26.89);Hemiplegia and hemiparesis;Muscle weakness (generalized) (M62.81) Hemiplegia - Right/Left: Left Hemiplegia - dominant/non-dominant: Non-dominant Hemiplegia - caused by:  Unspecified     Time: 0922-0948 PT Time Calculation (min) (ACUTE ONLY): 26 min  Charges:    $Therapeutic Activity: 23-37 mins PT General Charges $$ ACUTE PT VISIT: 1 Visit                     Tish Forge, DPT Acute Rehabilitation Services Office [phone]  11/26/23 10:39 AM

## 2023-11-26 NOTE — Assessment & Plan Note (Addendum)
 Mental status seems to be at baseline today.  Neurology following.   - Neuro and palliative care following, appreciate recs -Continue AED regimen: Keppra 1500 mg daily, Vimpat 200 mg twice daily, Dilantin 100 mg every 8 hours, clonazepam 0.5 mg 3 times daily and as needed Perampanel 4 mg daily - Continue prednisone taper, starting at 60 mg and tapering off by 10 mg every week -Seizure precautions -Appreciate PT/OT recs -AM CBC, BMP, mag - CCM

## 2023-11-27 ENCOUNTER — Telehealth: Payer: Self-pay

## 2023-11-27 NOTE — Transitions of Care (Post Inpatient/ED Visit) (Signed)
   11/27/2023  Name: Patricia White MRN: [id number] DOB: 1971/01/22  Today's TOC FU Call Status: Today's TOC FU Call Status:: Unsuccessful Call (1st Attempt) Unsuccessful Call (1st Attempt) Date: 11/27/23 (readnmitted)  Attempted to reach the patient regarding the most recent Inpatient/ED visit.  Follow Up Plan: Additional outreach attempts will be made to reach the patient to complete the Transitions of Care (Post Inpatient/ED visit) call.   [place], RN, BSN, CCM [facility], [facility] RN Care Manager Direct Dial: [phone]

## 2023-11-28 ENCOUNTER — Ambulatory Visit: Payer: Self-pay

## 2023-11-28 ENCOUNTER — Telehealth: Payer: Self-pay

## 2023-11-28 NOTE — Transitions of Care (Post Inpatient/ED Visit) (Signed)
   11/28/2023  Name: Patricia White MRN: [id number] DOB: 09-24-1970  Today's TOC FU Call Status: Today's TOC FU Call Status:: Unsuccessful Call (3rd Attempt) Unsuccessful Call (3rd Attempt) Date: 11/28/23  Attempted to reach the patient regarding the most recent Inpatient/ED visit.  Follow Up Plan: Additional outreach attempts will be made to reach the patient to complete the Transitions of Care (Post Inpatient/ED visit) call.   Tonia Frankel RN, CCM [place]  [facility] RN Care Manager [phone]

## 2023-11-29 ENCOUNTER — Telehealth: Payer: Self-pay

## 2023-11-29 NOTE — Transitions of Care (Post Inpatient/ED Visit) (Signed)
   11/29/2023  Name: Patricia White MRN: [id number] DOB: November 25, 1970  Today's TOC FU Call Status: Today's TOC FU Call Status:: Unsuccessful Call (2nd Attempt) Unsuccessful Call (2nd Attempt) Date: 11/28/23 Unsuccessful Call (3rd Attempt) Date: 11/29/23  Attempted to reach the patient regarding the most recent Inpatient/ED visit.  Follow Up Plan: No further outreach attempts will be made at this time. We have been unable to contact the patient.  Tonia Frankel RN, CCM [place]  [facility] RN Care Manager [phone]

## 2023-12-03 ENCOUNTER — Inpatient Hospital Stay: Payer: Self-pay | Admitting: Family Medicine

## 2023-12-05 ENCOUNTER — Ambulatory Visit: Payer: Self-pay | Admitting: Student

## 2023-12-16 DIAGNOSIS — E860 Dehydration: Secondary | ICD-10-CM | POA: Diagnosis not present

## 2023-12-16 DIAGNOSIS — D61818 Other pancytopenia: Secondary | ICD-10-CM | POA: Diagnosis not present

## 2023-12-27 ENCOUNTER — Emergency Department (HOSPITAL_COMMUNITY)

## 2023-12-27 ENCOUNTER — Encounter (HOSPITAL_COMMUNITY)

## 2023-12-27 ENCOUNTER — Inpatient Hospital Stay (HOSPITAL_COMMUNITY)
Admission: EM | Admit: 2023-12-27 | Discharge: 2024-01-03 | DRG: 101 | Disposition: A | Discharge status: Skilled Nursing Facility | Attending: Family Medicine | Admitting: Family Medicine

## 2023-12-27 ENCOUNTER — Inpatient Hospital Stay (HOSPITAL_COMMUNITY)

## 2023-12-27 DIAGNOSIS — R627 Adult failure to thrive: Secondary | ICD-10-CM | POA: Diagnosis present

## 2023-12-27 DIAGNOSIS — J45909 Unspecified asthma, uncomplicated: Secondary | ICD-10-CM | POA: Diagnosis present

## 2023-12-27 DIAGNOSIS — L89893 Pressure ulcer of other site, stage 3: Secondary | ICD-10-CM | POA: Diagnosis not present

## 2023-12-27 DIAGNOSIS — Z800 Family history of malignant neoplasm of digestive organs: Secondary | ICD-10-CM

## 2023-12-27 DIAGNOSIS — B888 Other specified infestations: Secondary | ICD-10-CM | POA: Diagnosis not present

## 2023-12-27 DIAGNOSIS — R220 Localized swelling, mass and lump, head: Secondary | ICD-10-CM | POA: Insufficient documentation

## 2023-12-27 DIAGNOSIS — E860 Dehydration: Secondary | ICD-10-CM | POA: Diagnosis present

## 2023-12-27 DIAGNOSIS — L98499 Non-pressure chronic ulcer of skin of other sites with unspecified severity: Secondary | ICD-10-CM | POA: Diagnosis present

## 2023-12-27 DIAGNOSIS — K219 Gastro-esophageal reflux disease without esophagitis: Secondary | ICD-10-CM | POA: Diagnosis present

## 2023-12-27 DIAGNOSIS — Z833 Family history of diabetes mellitus: Secondary | ICD-10-CM | POA: Diagnosis not present

## 2023-12-27 DIAGNOSIS — L89029 Pressure ulcer of left elbow, unspecified stage: Secondary | ICD-10-CM | POA: Diagnosis not present

## 2023-12-27 DIAGNOSIS — B882 Other arthropod infestations: Secondary | ICD-10-CM | POA: Diagnosis present

## 2023-12-27 DIAGNOSIS — F79 Unspecified intellectual disabilities: Secondary | ICD-10-CM

## 2023-12-27 DIAGNOSIS — M6281 Muscle weakness (generalized): Secondary | ICD-10-CM | POA: Diagnosis not present

## 2023-12-27 DIAGNOSIS — R000 Tachycardia, unspecified: Secondary | ICD-10-CM | POA: Diagnosis not present

## 2023-12-27 DIAGNOSIS — E119 Type 2 diabetes mellitus without complications: Secondary | ICD-10-CM | POA: Diagnosis present

## 2023-12-27 DIAGNOSIS — Z5919 Other inadequate housing: Secondary | ICD-10-CM | POA: Diagnosis not present

## 2023-12-27 DIAGNOSIS — Z6826 Body mass index (BMI) 26.0-26.9, adult: Secondary | ICD-10-CM | POA: Diagnosis not present

## 2023-12-27 DIAGNOSIS — Z820 Family history of epilepsy and other diseases of the nervous system: Secondary | ICD-10-CM | POA: Diagnosis not present

## 2023-12-27 DIAGNOSIS — Z79899 Other long term (current) drug therapy: Secondary | ICD-10-CM

## 2023-12-27 DIAGNOSIS — Z91148 Patient's other noncompliance with medication regimen for other reason: Secondary | ICD-10-CM

## 2023-12-27 DIAGNOSIS — E43 Unspecified severe protein-calorie malnutrition: Secondary | ICD-10-CM | POA: Diagnosis not present

## 2023-12-27 DIAGNOSIS — G8114 Spastic hemiplegia affecting left nondominant side: Secondary | ICD-10-CM | POA: Diagnosis not present

## 2023-12-27 DIAGNOSIS — G40901 Epilepsy, unspecified, not intractable, with status epilepticus: Principal | ICD-10-CM

## 2023-12-27 DIAGNOSIS — Z789 Other specified health status: Secondary | ICD-10-CM

## 2023-12-27 DIAGNOSIS — R569 Unspecified convulsions: Secondary | ICD-10-CM | POA: Diagnosis present

## 2023-12-27 DIAGNOSIS — G40909 Epilepsy, unspecified, not intractable, without status epilepticus: Secondary | ICD-10-CM

## 2023-12-27 DIAGNOSIS — R6884 Jaw pain: Secondary | ICD-10-CM | POA: Diagnosis not present

## 2023-12-27 DIAGNOSIS — E785 Hyperlipidemia, unspecified: Secondary | ICD-10-CM | POA: Diagnosis present

## 2023-12-27 DIAGNOSIS — S51002A Unspecified open wound of left elbow, initial encounter: Secondary | ICD-10-CM | POA: Diagnosis not present

## 2023-12-27 DIAGNOSIS — G801 Spastic diplegic cerebral palsy: Secondary | ICD-10-CM | POA: Diagnosis present

## 2023-12-27 DIAGNOSIS — K625 Hemorrhage of anus and rectum: Secondary | ICD-10-CM | POA: Diagnosis present

## 2023-12-27 DIAGNOSIS — Z8249 Family history of ischemic heart disease and other diseases of the circulatory system: Secondary | ICD-10-CM

## 2023-12-27 DIAGNOSIS — M1909 Primary osteoarthritis, other specified site: Secondary | ICD-10-CM | POA: Diagnosis not present

## 2023-12-27 DIAGNOSIS — Z860100 Personal history of colon polyps, unspecified: Secondary | ICD-10-CM | POA: Diagnosis not present

## 2023-12-27 DIAGNOSIS — R1311 Dysphagia, oral phase: Secondary | ICD-10-CM | POA: Diagnosis not present

## 2023-12-27 DIAGNOSIS — Z66 Do not resuscitate: Secondary | ICD-10-CM | POA: Diagnosis present

## 2023-12-27 DIAGNOSIS — J302 Other seasonal allergic rhinitis: Secondary | ICD-10-CM | POA: Diagnosis not present

## 2023-12-27 DIAGNOSIS — G809 Cerebral palsy, unspecified: Secondary | ICD-10-CM

## 2023-12-27 DIAGNOSIS — Z743 Need for continuous supervision: Secondary | ICD-10-CM | POA: Diagnosis not present

## 2023-12-27 DIAGNOSIS — I10 Essential (primary) hypertension: Secondary | ICD-10-CM | POA: Diagnosis present

## 2023-12-27 DIAGNOSIS — Z91199 Patient's noncompliance with other medical treatment and regimen due to unspecified reason: Secondary | ICD-10-CM | POA: Diagnosis not present

## 2023-12-27 DIAGNOSIS — Z7401 Bed confinement status: Secondary | ICD-10-CM | POA: Diagnosis not present

## 2023-12-27 DIAGNOSIS — R404 Transient alteration of awareness: Secondary | ICD-10-CM | POA: Diagnosis not present

## 2023-12-27 DIAGNOSIS — S41102A Unspecified open wound of left upper arm, initial encounter: Secondary | ICD-10-CM | POA: Insufficient documentation

## 2023-12-27 LAB — URINALYSIS, W/ REFLEX TO CULTURE (INFECTION SUSPECTED)
Bacteria, UA: NONE SEEN
Bilirubin Urine: NEGATIVE
Glucose, UA: 50 mg/dL — AB
Hgb urine dipstick: NEGATIVE
Ketones, ur: NEGATIVE mg/dL
Leukocytes,Ua: NEGATIVE
Nitrite: NEGATIVE
Protein, ur: NEGATIVE mg/dL
Specific Gravity, Urine: 1.015 (ref 1.005–1.030)
pH: 8 (ref 5.0–8.0)

## 2023-12-27 LAB — PHENYTOIN LEVEL, TOTAL: Phenytoin Lvl: <2.5 ug/mL — ABNORMAL LOW (ref 10.0–20.0)

## 2023-12-27 LAB — I-STAT CHEM 8, ED
BUN: 10 mg/dL (ref 6–20)
Calcium, Ion: 1 mmol/L — ABNORMAL LOW (ref 1.15–1.40)
Chloride: 96 mmol/L — ABNORMAL LOW (ref 98–111)
Creatinine, Ser: 0.8 mg/dL (ref 0.44–1.00)
Glucose, Bld: 185 mg/dL — ABNORMAL HIGH (ref 70–99)
HCT: 33 % — ABNORMAL LOW (ref 36.0–46.0)
Hemoglobin: 11.2 g/dL — ABNORMAL LOW (ref 12.0–15.0)
Potassium: 3.7 mmol/L (ref 3.5–5.1)
Sodium: 134 mmol/L — ABNORMAL LOW (ref 135–145)
TCO2: 30 mmol/L (ref 22–32)

## 2023-12-27 LAB — CBC
HCT: 33.7 % — ABNORMAL LOW (ref 36.0–46.0)
Hemoglobin: 10.8 g/dL — ABNORMAL LOW (ref 12.0–15.0)
MCH: 30.8 pg (ref 26.0–34.0)
MCHC: 32 g/dL (ref 30.0–36.0)
MCV: 96 fL (ref 80.0–100.0)
Platelets: 212 10*3/uL (ref 150–400)
RBC: 3.51 MIL/uL — ABNORMAL LOW (ref 3.87–5.11)
RDW: 14.4 % (ref 11.5–15.5)
WBC: 8.3 10*3/uL (ref 4.0–10.5)
nRBC: 0 % (ref 0.0–0.2)

## 2023-12-27 LAB — COMPREHENSIVE METABOLIC PANEL WITH GFR
ALT: 43 U/L (ref 0–44)
AST: 30 U/L (ref 15–41)
Albumin: 2.7 g/dL — ABNORMAL LOW (ref 3.5–5.0)
Alkaline Phosphatase: 74 U/L (ref 38–126)
Anion gap: 11 (ref 5–15)
BUN: 9 mg/dL (ref 6–20)
CO2: 29 mmol/L (ref 22–32)
Calcium: 8.5 mg/dL — ABNORMAL LOW (ref 8.9–10.3)
Chloride: 96 mmol/L — ABNORMAL LOW (ref 98–111)
Creatinine, Ser: 0.88 mg/dL (ref 0.44–1.00)
GFR, Estimated: >60 mL/min (ref >60)
Glucose, Bld: 184 mg/dL — ABNORMAL HIGH (ref 70–99)
Potassium: 3.6 mmol/L (ref 3.5–5.1)
Sodium: 136 mmol/L (ref 135–145)
Total Bilirubin: 0.7 mg/dL (ref 0.0–1.2)
Total Protein: 6.1 g/dL — ABNORMAL LOW (ref 6.5–8.1)

## 2023-12-27 LAB — I-STAT CG4 LACTIC ACID, ED
Lactic Acid, Venous: 1.5 mmol/L (ref 0.5–1.9)
Lactic Acid, Venous: 1.3 mmol/L (ref 0.5–1.9)

## 2023-12-27 LAB — CBG MONITORING, ED: Glucose-Capillary: 198 mg/dL — ABNORMAL HIGH (ref 70–99)

## 2023-12-27 MED ORDER — SODIUM CHLORIDE 0.9 % IV BOLUS
1000.0000 mL | Freq: Once | INTRAVENOUS | Status: AC
  Administered 2023-12-27: 1000 mL via INTRAVENOUS

## 2023-12-27 MED ORDER — PERAMPANEL 2 MG PO TABS
4.0000 mg | ORAL_TABLET | Freq: Every day | ORAL | Status: DC
  Administered 2023-12-28 – 2024-01-03 (×7): 4 mg via ORAL
  Filled 2023-12-27 (×6): qty 2

## 2023-12-27 MED ORDER — PREDNISONE 20 MG PO TABS
20.0000 mg | ORAL_TABLET | Freq: Every day | ORAL | Status: AC
  Administered 2023-12-28 – 2023-12-29 (×2): 20 mg via ORAL
  Filled 2023-12-27 (×2): qty 1

## 2023-12-27 MED ORDER — LEVETIRACETAM 750 MG PO TABS
1500.0000 mg | ORAL_TABLET | Freq: Two times a day (BID) | ORAL | Status: DC
  Administered 2023-12-28 – 2024-01-03 (×13): 1500 mg via ORAL
  Filled 2023-12-27 (×6): qty 2
  Filled 2023-12-27: qty 6
  Filled 2023-12-27 (×5): qty 2
  Filled 2023-12-27: qty 3

## 2023-12-27 MED ORDER — MIDAZOLAM HCL (PF) 10 MG/2ML IJ SOLN
INTRAMUSCULAR | Status: AC
  Administered 2023-12-27: 10 mg
  Filled 2023-12-27: qty 2

## 2023-12-27 MED ORDER — PREDNISONE 5 MG PO TABS
10.0000 mg | ORAL_TABLET | Freq: Every day | ORAL | Status: DC
  Administered 2023-12-30 – 2024-01-03 (×5): 10 mg via ORAL
  Filled 2023-12-27 (×5): qty 2

## 2023-12-27 MED ORDER — LACOSAMIDE 200 MG PO TABS
200.0000 mg | ORAL_TABLET | Freq: Two times a day (BID) | ORAL | Status: DC
  Administered 2023-12-28 – 2024-01-03 (×13): 200 mg via ORAL
  Filled 2023-12-27 (×4): qty 1
  Filled 2023-12-27: qty 4
  Filled 2023-12-27 (×8): qty 1

## 2023-12-27 MED ORDER — ATORVASTATIN CALCIUM 40 MG PO TABS
40.0000 mg | ORAL_TABLET | Freq: Every day | ORAL | Status: DC
  Administered 2023-12-28 – 2024-01-03 (×7): 40 mg via ORAL
  Filled 2023-12-27 (×7): qty 1

## 2023-12-27 MED ORDER — PHENYTOIN 50 MG PO CHEW
100.0000 mg | CHEWABLE_TABLET | Freq: Three times a day (TID) | ORAL | Status: DC
  Administered 2023-12-27 – 2023-12-28 (×3): 100 mg via ORAL
  Filled 2023-12-27 (×5): qty 2

## 2023-12-27 MED ORDER — SODIUM CHLORIDE 0.9 % IV SOLN
10.0000 mg/kg | Freq: Once | INTRAVENOUS | Status: AC
  Administered 2023-12-27: 713 mg via INTRAVENOUS
  Filled 2023-12-27: qty 14.26

## 2023-12-27 MED ORDER — PANTOPRAZOLE SODIUM 40 MG PO TBEC
40.0000 mg | DELAYED_RELEASE_TABLET | Freq: Every day | ORAL | Status: DC
  Administered 2023-12-28 – 2024-01-02 (×6): 40 mg via ORAL
  Filled 2023-12-27 (×6): qty 1

## 2023-12-27 MED ORDER — MIDAZOLAM HCL 2 MG/2ML IJ SOLN: 5.0000 mg | Freq: Once | INTRAMUSCULAR | Status: DC | PRN

## 2023-12-27 MED ORDER — LEVETIRACETAM (KEPPRA) 500 MG/5 ML ADULT IV PUSH
3000.0000 mg | Freq: Once | INTRAVENOUS | Status: DC
Start: 2023-12-27 — End: 2023-12-27
  Filled 2023-12-27: qty 30

## 2023-12-27 MED ORDER — ENOXAPARIN SODIUM 40 MG/0.4ML IJ SOSY
40.0000 mg | PREFILLED_SYRINGE | INTRAMUSCULAR | Status: DC
  Administered 2023-12-27 – 2024-01-02 (×7): 40 mg via SUBCUTANEOUS
  Filled 2023-12-27 (×7): qty 0.4

## 2023-12-27 MED ORDER — CLONAZEPAM 0.5 MG PO TABS
0.5000 mg | ORAL_TABLET | Freq: Three times a day (TID) | ORAL | Status: DC
  Administered 2023-12-27 – 2024-01-03 (×20): 0.5 mg via ORAL
  Filled 2023-12-27 (×20): qty 1

## 2023-12-27 MED ORDER — FAMOTIDINE 20 MG PO TABS
20.0000 mg | ORAL_TABLET | Freq: Two times a day (BID) | ORAL | Status: DC
  Administered 2023-12-27 – 2024-01-03 (×14): 20 mg via ORAL
  Filled 2023-12-27 (×14): qty 1

## 2023-12-27 MED ORDER — LEVETIRACETAM (KEPPRA) 500 MG/5 ML ADULT IV PUSH
1500.0000 mg | Freq: Once | INTRAVENOUS | Status: AC
  Administered 2023-12-27: 1500 mg via INTRAVENOUS

## 2023-12-27 MED ORDER — SODIUM CHLORIDE 0.9 % IV SOLN
200.0000 mg | Freq: Once | INTRAVENOUS | Status: AC
  Administered 2023-12-27: 200 mg via INTRAVENOUS
  Filled 2023-12-27: qty 20

## 2023-12-27 NOTE — Assessment & Plan Note (Signed)
 HTN: no home medications, monitor clinically  T2DM: last A1c 5.3 on 11/23/2022, monitor glucose with daily BMP Cerebral palsy: residual left sided weakness  HLD: continue Lipitor 40 mg  GERD: continue Pepcid  20 mg and Protonix  40 mg daily

## 2023-12-27 NOTE — ED Notes (Signed)
 Pt actively seizing, MD notified, versed pulled and given

## 2023-12-27 NOTE — Consult Note (Signed)
 NEUROLOGY CONSULT NOTE   Date of service: Dec 27, 2023 Patient Name: Patricia White MRN:  [id number] DOB:  22-Jul-1971 Chief Complaint: "seizure" Requesting Provider: Rolinda Climes, DO  History of Present Illness  Patricia White is a 53 y.o. 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  No family at bedside.  According to EMS report, multiple seizures today at home.  Reportedly compliant to medications but that history is questionable. She also has bedbugs and wound on her left arm from the prior IV-unsure what her personal hygiene level is at this time. No family member or friend at bedside to provide history-not reachable by phone either. 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   Seizures (HCC)     Past Surgical History:  Procedure Laterality Date   ESOPHAGOGASTRODUODENOSCOPY N/A 11/08/2023   Procedure: EGD (ESOPHAGOGASTRODUODENOSCOPY);  Surgeon: Patricia Kinder, DO;  [facility] ENDOSCOPY;  Service: Gastroenterology;  Laterality: N/A;   POLYPECTOMY  11/08/2023   Procedure: POLYPECTOMY;  Surgeon: Patricia Kinder, DO;  Location: [facility] ENDOSCOPY;  Service: Gastroenterology;;    Family History: Family History  Problem Relation Age of Onset   Migraines Mother    Cancer Mother        liver cancer   Diabetes Maternal Aunt    Hypertension Maternal Grandmother    Colon cancer Maternal Grandfather 60   Prostate cancer Maternal Grandfather    Throat cancer Maternal Grandfather     Social History  reports that  she has never smoked. She has never used smokeless tobacco. She reports that she does not drink alcohol and does not use drugs.  No Known Allergies  Medications   Current Facility-Administered Medications:    lacosamide  (VIMPAT ) 200 mg in sodium chloride  0.9 % 25 mL IVPB, 200 mg, Intravenous, Once, White, Patricia J, DO   omalizumab  (XOLAIR ) prefilled syringe 300 mg, 300 mg, Subcutaneous, Q28 days, Patricia Campanile, MD, 300 mg at 11/19/20 1559  Current Outpatient Medications:    atorvastatin  (LIPITOR) 40 MG tablet, TAKE 1 TABLET BY MOUTH DAILY (Patient taking differently: Take 40 mg by mouth in the morning.), Disp: 100 tablet, Rfl: 2   clonazePAM  (KLONOPIN ) 0.5 MG tablet, Take 1 tablet (0.5 mg total) by mouth 3 (three) times daily., Disp: 90 tablet, Rfl: 0   famotidine  (PEPCID ) 20 MG tablet, Take 1 tablet (20 mg total) by mouth 2 (two) times daily for 14 days. (Patient taking differently: Take 20 mg by mouth daily.), Disp: 28 tablet, Rfl: 0   lacosamide  (VIMPAT ) 200 MG TABS tablet, Take 1 tablet (200 mg total) by mouth 2 (two) times daily., Disp: 60 tablet, Rfl: 0   levETIRAcetam  (KEPPRA ) 750 MG tablet, Take 2 tablets (1,500 mg total) by mouth 2 (two) times daily., Disp: 120 tablet, Rfl: 0   Nutritional Supplements (,FEEDING SUPPLEMENT, PROSOURCE PLUS) liquid, Take 30 mLs by mouth 2 (two) times daily between meals. (Patient not taking: Reported on 11/20/2023), Disp: 887 mL, Rfl: 2   pantoprazole  (PROTONIX ) 40 MG tablet, Take 1  tablet (40 mg total) by mouth daily., Disp: 30 tablet, Rfl: 0   Perampanel  4 MG TABS, Take 1 tablet (4 mg total) by mouth daily., Disp: 30 tablet, Rfl: 0   phenytoin  (DILANTIN ) 50 MG tablet, Chew 2 tablets (100 mg total) by mouth 3 (three) times daily., Disp: 180 tablet, Rfl: 0   predniSONE  (DELTASONE ) 20 MG tablet, Take 3 tablets (60 mg total) by mouth daily with breakfast for 6 days, THEN 2.5 tablets (50 mg total) daily with breakfast for 7 days, THEN 2 tablets (40  mg total) daily with breakfast for 7 days, THEN 1.5 tablets (30 mg total) daily with breakfast for 7 days, THEN 1 tablet (20 mg total) daily with breakfast for 7 days, THEN 0.5 tablets (10 mg total) daily with breakfast for 7 days., Disp: 71 tablet, Rfl: 0   sucralfate  (CARAFATE ) 1 GM/10ML suspension, Take 10 mLs (1 g total) by mouth 4 (four) times daily -  with meals and at bedtime. (Patient not taking: Reported on 11/20/2023), Disp: 414 mL, Rfl: 2  Vitals   Vitals:   12/27/23 1607 12/27/23 1614  BP:  126/81  Pulse:  (!) 130  Resp:  (!) 27  Temp:  99.9 F (37.7 C)  TempSrc:  Axillary  SpO2: 99% 100%    There is no height or weight on file to calculate BMI.  Physical Exam   General: Chronically ill looking patient, with supplemental oxygen in place via not rebreather mask. HEENT: Normocephalic atraumatic Lungs: Clear Cardiovascular regular rate rhythm Neurological exam Obtunded Does not open eyes to voice Does not open eyes to noxious stimulation Nonverbal Gaze is midline Has some left upper extremity and lower extremity twitching On noxious stimulation, localizes with the right upper extremity but does not move the left upper or lower extremity much.   Labs/Imaging/Neurodiagnostic studies   CBC: No results for input(s): "WBC", "NEUTROABS", "HGB", "HCT", "MCV", "PLT" in the last 168 hours. Basic Metabolic Panel:  Lab Results  Component Value Date   NA 142 11/26/2023   K 3.4 (L) 11/26/2023   CO2 23 11/26/2023   GLUCOSE 73 11/26/2023   BUN 8 11/26/2023   CREATININE 0.89 11/26/2023   CALCIUM  8.5 (L) 11/26/2023   GFRNONAA >60 11/26/2023   GFRAA >60 01/19/2020   Lipid Panel:  Lab Results  Component Value Date   LDLCALC 121 (H) 11/23/2020   HgbA1c:  Lab Results  Component Value Date   HGBA1C 5.3 11/23/2023    INR  Lab Results  Component Value Date   INR 1.0 09/14/2019   APTT  Lab Results  Component Value Date   APTT 28 09/14/2019   AED levels:  Lab  Results  Component Value Date   PHENYTOIN  14.0 11/22/2023    CT Head without contrast(Personally reviewed): Most recent CT is from 2023-unremarkable Current CT ordered and pending   ASSESSMENT   Patricia White is a 53 y.o. female with past medical history of CP/MR, seizure disorder, left spastic paresis nonverbal, recent admission for failure to thrive and dehydration as well as electrographic seizures persistent with improved clinical exam during last admission now presenting with EMS who were called in for patient having multiple seizures.  Patient unable to provide any history at this time No family at bedside Family also not reachable by phone Unclear if she is compliant to medications-there was some question of noncompliance to medication and poor living conditions.  Impression: Breakthrough seizure, leading to status epilepticus  RECOMMENDATIONS  Load with Keppra   and Vimpat  IV. Check phenytoin  levels.  Depending on the levels, will recommend the dose of phenytoin . Stat EEG hookup for long-term EEG.  During her last admission, she continued to have electrographic status epilepticus although her mentation improved and she was eventually discharged home with the last EEG still consistent with electrographic status epilepticus. Because her exam is not at baseline, I would work her up to LTM EEG and treat the electrographic status at least till the clinical examination improves I would also recommend obtaining a CT head, checking urinalysis and chest x-ray to rule out any acute intracranial issue or infectious etiology Neurology will follow with you Plan discussed with Dr. Linder Revere  ______________________________________________________________________    Signed, Tona Francis, MD [facility]  CRITICAL CARE ATTESTATION Performed by: Tona Francis, MD Total critical care time: 40 minutes Critical care time was exclusive of separately billable procedures and treating  other patients and/or supervising APPs/Residents/Students Critical care was necessary to treat or prevent imminent or life-threatening deterioration. This patient is critically ill and at significant risk for neurological worsening and/or death and care requires constant monitoring. Critical care was time spent personally by me on the following activities: development of treatment plan with patient and/or surrogate as well as nursing, discussions with consultants, evaluation of patient's response to treatment, examination of patient, obtaining history from patient or surrogate, ordering and performing treatments and interventions, ordering and review of laboratory studies, ordering and review of radiographic studies, pulse oximetry, re-evaluation of patient's condition, participation in multidisciplinary rounds and medical decision making of high complexity in the care of this patient.

## 2023-12-27 NOTE — Assessment & Plan Note (Addendum)
 Multiple breakthrough seizures.  Neurology consulted recommending EEG overnight.  CT head pending.  So far negative work up for infections causes (WBC, CXR, UA).  Phenytoin  levels noted to be low, suggest possible compliance issue. Patient passed bedside swallow, thus will start home meds. - Admit to [facility], attending Dr. McDiarmid  - Med-tele, Vital signs per floor - NPO while altered and post-ictal  - Load Keppra , and Vimpat  - PT/OT to treat - VTE prophylaxis Lovenox   - AM CBC/BMP  - Fall precautions - Delirium precautions  - Seizure precautions

## 2023-12-27 NOTE — ED Provider Notes (Addendum)
 [facility] EMERGENCY DEPARTMENT AT [facility] Provider Note   CSN: [id number] Arrival date & time: 12/27/23  1559     History  Chief Complaint  Patient presents with   Seizures    Patricia White is a 53 y.o. female.  53 year old female presenting emergency department for breakthrough seizure.  Had seizures at home, unclear how many seizures.  However EMS witnessed 3-4 seizures and gave 5 mg of Versed and round.  She is a DNR DNI, history of cerebral palsy.  History of epilepsy on numerous antiepileptic medications.  She is postictal, protecting her airway   Seizures      Home Medications Prior to Admission medications   Medication Sig Start Date End Date Taking? Authorizing Provider  atorvastatin  (LIPITOR) 40 MG tablet TAKE 1 TABLET BY MOUTH DAILY Patient taking differently: Take 40 mg by mouth in the morning. 07/30/23   Ivin Marrow, MD  clonazePAM  (KLONOPIN ) 0.5 MG tablet Take 1 tablet (0.5 mg total) by mouth 3 (three) times daily. 11/26/23     famotidine  (PEPCID ) 20 MG tablet Take 1 tablet (20 mg total) by mouth 2 (two) times daily for 14 days. Patient taking differently: Take 20 mg by mouth daily. 07/17/23 11/20/23  Ivin Marrow, MD  lacosamide  (VIMPAT ) 200 MG TABS tablet Take 1 tablet (200 mg total) by mouth 2 (two) times daily. 11/26/23     levETIRAcetam  (KEPPRA ) 750 MG tablet Take 2 tablets (1,500 mg total) by mouth 2 (two) times daily. 11/26/23   Ivin Marrow, MD  Nutritional Supplements (,FEEDING SUPPLEMENT, PROSOURCE PLUS) liquid Take 30 mLs by mouth 2 (two) times daily between meals. Patient not taking: Reported on 11/20/2023 11/16/23   Ivin Marrow, MD  pantoprazole  (PROTONIX ) 40 MG tablet Take 1 tablet (40 mg total) by mouth daily. 11/17/23   Ivin Marrow, MD  Perampanel  4 MG TABS Take 1 tablet (4 mg total) by mouth daily. 11/26/23   Ivin Marrow, MD  phenytoin  (DILANTIN ) 50 MG tablet Chew 2 tablets (100 mg total) by mouth 3 (three) times  daily. 11/26/23   Baloch, Mahnoor, MD  predniSONE  (DELTASONE ) 20 MG tablet Take 3 tablets (60 mg total) by mouth daily with breakfast for 6 days, THEN 2.5 tablets (50 mg total) daily with breakfast for 7 days, THEN 2 tablets (40 mg total) daily with breakfast for 7 days, THEN 1.5 tablets (30 mg total) daily with breakfast for 7 days, THEN 1 tablet (20 mg total) daily with breakfast for 7 days, THEN 0.5 tablets (10 mg total) daily with breakfast for 7 days. 11/26/23 01/06/24  Ivin Marrow, MD  sucralfate  (CARAFATE ) 1 GM/10ML suspension Take 10 mLs (1 g total) by mouth 4 (four) times daily -  with meals and at bedtime. Patient not taking: Reported on 11/20/2023 11/16/23   Ivin Marrow, MD      Allergies    Patient has no known allergies.    Review of Systems   Review of Systems  Neurological:  Positive for seizures.    Physical Exam Updated Vital Signs BP (!) 134/96   Pulse (!) 111   Temp 99.9 F (37.7 C) (Axillary)   Resp 20   Wt 71.3 kg Comment: April 2025  SpO2 100%   BMI 26.16 kg/m  Physical Exam Vitals and nursing note reviewed.  Constitutional:      Appearance: She is obese.  HENT:     Head: Normocephalic.     Nose: Nose normal.     Mouth/Throat:  Mouth: Mucous membranes are dry.  Eyes:     Conjunctiva/sclera: Conjunctivae normal.  Cardiovascular:     Rate and Rhythm: Regular rhythm. Tachycardia present.  Pulmonary:     Effort: Pulmonary effort is normal.     Breath sounds: Normal breath sounds.  Abdominal:     General: Abdomen is flat. There is no distension.     Tenderness: There is no abdominal tenderness. There is no guarding or rebound.  Musculoskeletal:        General: Normal range of motion.  Skin:    General: Skin is warm.     Capillary Refill: Capillary refill takes less than 2 seconds.  Neurological:     Comments: Postictal  Psychiatric:     Comments: Not possible to assess     ED Results / Procedures / Treatments   Labs (all labs ordered  are listed, but only abnormal results are displayed) Labs Reviewed  CBC - Abnormal; Notable for the following components:      Result Value   RBC 3.51 (*)    Hemoglobin 10.8 (*)    HCT 33.7 (*)    All other components within normal limits  PHENYTOIN  LEVEL, TOTAL - Abnormal; Notable for the following components:   Phenytoin  Lvl <2.5 (*)    All other components within normal limits  COMPREHENSIVE METABOLIC PANEL WITH GFR - Abnormal; Notable for the following components:   Chloride 96 (*)    Glucose, Bld 184 (*)    Calcium  8.5 (*)    Total Protein 6.1 (*)    Albumin 2.7 (*)    All other components within normal limits  URINALYSIS, W/ REFLEX TO CULTURE (INFECTION SUSPECTED) - Abnormal; Notable for the following components:   Glucose, UA 50 (*)    All other components within normal limits  I-STAT CHEM 8, ED - Abnormal; Notable for the following components:   Sodium 134 (*)    Chloride 96 (*)    Glucose, Bld 185 (*)    Calcium , Ion 1.00 (*)    Hemoglobin 11.2 (*)    HCT 33.0 (*)    All other components within normal limits  CBG MONITORING, ED - Abnormal; Notable for the following components:   Glucose-Capillary 198 (*)    All other components within normal limits  CULTURE, BLOOD (ROUTINE X 2)  CULTURE, BLOOD (ROUTINE X 2)  LEVETIRACETAM  LEVEL  PHENYTOIN  LEVEL, TOTAL  BASIC METABOLIC PANEL WITH GFR  CBC  I-STAT CG4 LACTIC ACID, ED  I-STAT CG4 LACTIC ACID, ED    EKG EKG Interpretation Date/Time:  Thursday Dec 27 2023 16:31:48 EDT Ventricular Rate:  125 PR Interval:  120 QRS Duration:  80 QT Interval:  311 QTC Calculation: 449 R Axis:   73  Text Interpretation: Sinus tachycardia Probable left atrial enlargement Confirmed by Elise Guile [phone]) on 12/27/2023 9:51:42 PM  Radiology DG Chest Portable 1 View Result Date: 12/27/2023 CLINICAL DATA:  Seizure. EXAM: PORTABLE CHEST 1 VIEW semi upright COMPARISON:  X-ray 11/19/2023 FINDINGS: Underinflation. No consolidation,  pneumothorax or effusion. No edema. Normal cardiopericardial silhouette. Bronchovascular crowding. Overlapping cardiac leads. Degenerative changes along the spine. IMPRESSION: Underinflation. Bronchovascular crowding. No consolidation or effusion. Electronically Signed   By: Adrianna Horde M.D.   On: 12/27/2023 18:01    Procedures .Critical Care  Performed by: Rolinda Climes, DO Authorized by: Rolinda Climes, DO   Critical care provider statement:    Critical care time (minutes):  30   Critical care was necessary to treat  or prevent imminent or life-threatening deterioration of the following conditions:  CNS failure or compromise   Critical care was time spent personally by me on the following activities:  Development of treatment plan with patient or surrogate, discussions with consultants, evaluation of patient's response to treatment, examination of patient, ordering and review of laboratory studies, ordering and review of radiographic studies, ordering and performing treatments and interventions, pulse oximetry, re-evaluation of patient's condition and review of old charts     Medications Ordered in ED Medications  enoxaparin  (LOVENOX ) injection 40 mg (40 mg Subcutaneous Given 12/27/23 2256)  sodium chloride  0.9 % bolus 1,000 mL (0 mLs Intravenous Stopped 12/27/23 1834)  midazolam PF (VERSED) 10 MG/2ML injection (10 mg  Given 12/27/23 1627)  levETIRAcetam  (KEPPRA ) undiluted injection 1,500 mg (1,500 mg Intravenous Given 12/27/23 1639)  lacosamide  (VIMPAT ) 200 mg in sodium chloride  0.9 % 25 mL IVPB (0 mg Intravenous Stopped 12/27/23 1821)  fosPHENYtoin  (CEREBYX ) 713 mg PE in sodium chloride  0.9 % 50 mL IVPB (0 mg PE Intravenous Stopped 12/27/23 2158)    ED Course/ Medical Decision Making/ A&P Clinical Course as of 12/27/23 2257  Thu Dec 27, 2023  1615 Admitted 4/7 for seizures; "Assessment & Plan: Status epilepticus in patient with known epilepsy, likely due to poor intake and medication  non-adherence given low drug levels.  No signs of intercurrent infection.  Cerebral palsy - reasonably functional at baseline but declining.  DNR/DNI per prior discussions.  Anemia - multifactorial, medication effects and poor nutrition.    Plan:  - Post-ictal state appears to be resolving despite 'active' EEG.  - Continue current AED's - May be sleepy from Keppra  load.  - DNI/DNR - still protecting airway.  - complete nutritional work up for anemia. Ferritin normal last week. " [TY]  1714 Case has been discussed with neurology.  Medications ordered further recommendations.  Getting EEG. [TY]  1728 Patient is now tracking with eyes, but not following commands otherwise. [TY]  1800 Following commands.  Asking to use restroom. [TY]    Clinical Course User Index [TY] Rolinda Climes, DO                                 Medical Decision Making 53 year old female presenting emergency department for seizures.  History of the same, on numerous antiepileptic medications.  Was admitted recently for the same.  EMS reported seizures around gave Versed.  Had 2 more short-lived seizures here in the emergency department was given further IM Versed.  Loaded with Keppra .  Seizures seemingly have stopped.  Case discussed with Dr. Bonnita Buttner with neurology; EEG, phenytoin  levels, CT head and admission.  Per chart review patient seemingly with some compliance issues with her medications.  Phenytoin  level unmeasurable.  Loaded with fosphenytoin  after discussing with Dr. Portia Brittle with neurology.  Her mental status is slowly improved.  No obvious source of infection.  No leukocytosis, no metabolic derangements to explain breakthrough seizure.  No transaminitis to suggest hepatobiliary disease.  Of note patient did have bedbugs.  Case discussed with them medicine team who agrees to admit patient for breakthrough seizures..  Amount and/or Complexity of Data Reviewed Independent Historian: EMS External Data Reviewed:      Details: On numerous antiepileptics, Klonopin , Vimpat , Keppra , phenytoin .  Appears last EEG with persistent epileptiform findings Labs: ordered. Radiology: ordered.  Risk Decision regarding hospitalization.         Final Clinical Impression(s) /  ED Diagnoses Final diagnoses:  Seizures [facility])    Rx / DC Orders ED Discharge Orders     None         Rolinda Climes, DO 12/27/23 2256    Rolinda Climes, DO 12/27/23 2257

## 2023-12-27 NOTE — Hospital Course (Addendum)
 Patricia White is a 53 y.o.female with a history of cerebral palsy, seizure disorder, GERD, HTN, HLD who was admitted to the [facility] Medicine Teaching Service at [facility] for status epilepticus/breakthrough seizures.   Her hospital course is detailed below:  Status epilepticus/breakthrough seizures Patient presented to the ED from home following multiple seizures at home, as well as with EMS en route.  In the ED she was found to be tachycardic; loaded with Keppra  and Vimpat , given Versed .  Neurology was consulted and recommended long-term EEG and head CT; imaging showed no acute intracranial processes, and EEG was consistent with her baseline from previous studies.  Infectious workup in the ED was negative (UA, WBC, CXR). Her Dilantin  levels were extremely low on admission, and her family was unable to confirm which medications she took daily. Neurology felt her seizure activity was due to poor medication compliance. Patient remained seizure free through out her stay, and was discharged to SNF.  Patient had a prior APS report against her family for similar incident with medication non-compliance. Social work made aware.   Bilateral Parotid Swelling Patient asymptomatic, noted on exam. CT completed with no change from prior exam in 2021.  Bedbug Infestation Noted on admission. Environmental services were made aware, contact precautions were in place through out her stay. She did not require topical steroids during her stay.   Open wound of left upper arm Noted during hospitalization.  Evaluated by wound care nurse who recommended manuka honey dressing.  Remained stable throughout hospitalization.  Blood per rectum Patient with 1 instance of bright red blood on stool with semihard consistency.  Patient had no further instances of blood per rectum.  See recommendations below.  Other chronic conditions were medically managed with home medications and formulary alternatives as necessary (GERD, HTN,  HLD)   PCP Follow-up Recommendations: Recommend wound care follow up Needs colonoscopy outpatient

## 2023-12-27 NOTE — ED Triage Notes (Signed)
 Patient bib EMS after having a seizures at home. Patient has a history of cerebal palsy and seizures.   No recent med changes and is compliant with her medications.   Patient has bed bugs. VSS.   EMS witnessed 3-4 seizures enroute and had a seizure here in the ED. Had 5mg  of versed enroute to the ED

## 2023-12-27 NOTE — ED Notes (Addendum)
 MD aware about not able to obtain blood work.

## 2023-12-27 NOTE — H&P (Cosign Needed Addendum)
 Hospital Admission History and Physical Service Pager: [phone]  Patient name: Patricia White Medical record number: [id number] Date of Birth: 02-12-1971 Age: 53 y.o. Gender: female  Primary Care Provider: Ivin Marrow, MD Consultants: Neurology Code Status: DNR which was confirmed with family if patient unable to confirm   Preferred Emergency Contact:  Contact Information     Name Relation Home Work Mobile   Ferns,[name] Legal Guardian [phone]  [phone]   Cristyn, Stotler [phone]  [phone]      Other Contacts   None on File      Chief Complaint: Seizure   Assessment and Plan: RUPA SCHIMMER is a 53 y.o. female presenting with multiple seizures.  Patient has a past medical history of seizures/cerebral palsy with left-sided spastic paresis, seizure disorder, nonverbal, failure to thrive, dehydration, malnutrition.  Differential for presentation includes  noncompliance to medications- most likely as phenytoin  level is low breakthrough seizures-also possible, but less likely given hx of medication issues Infection-less likely as she has normal WBC, CXR and UA Need for titration of AEDs, possibly needing to increase/better manage her AEDs.   Assessment & Plan Seizure [facility]) Multiple breakthrough seizures.  Neurology consulted recommending EEG overnight.  CT head pending.  So far negative work up for infections causes (WBC, CXR, UA).  Phenytoin  levels noted to be low, suggest possible compliance issue. Patient passed bedside swallow, thus will start home meds. - Admit to FMTS, attending Dr. McDiarmid  - Med-tele, Vital signs per floor - NPO while altered and post-ictal  - Load Keppra , and Vimpat  - PT/OT to treat - VTE prophylaxis Lovenox   - AM CBC/BMP  - Fall precautions - Delirium precautions  - Seizure precautions  Infestation by bed bug EPS called.  - contact precautions  Chronic health problem HTN: no home medications, monitor  clinically  T2DM: last A1c 5.3 on 11/23/2022, monitor glucose with daily BMP Cerebral palsy: residual left sided weakness  HLD: continue Lipitor 40 mg  GERD: continue Pepcid  20 mg and Protonix  40 mg daily      FEN/GI: Carb modified VTE Prophylaxis: Lovenox    Disposition: Med-tele   History of Present Illness:  Patricia White is a 53 y.o. female presenting with multiple seizures at home.  EMS was called to the home after patient reportedly had multiple seizures.   Recently admitted to our service on 11/2023.  Care was escalated to the ICU due to status epilepticus and inability protect airway.  She was started on prednisone  taper.  She was discharged with Keppra  1500 mg daily, Vimpat  200 mg twice daily and Dilantin  100 mg every 8 hours clonazepam  0.5 mg 3 times a day and perampanel  4 mg daily. There was question of medication non-compliance and APS was called for concern for negligence.  Social work was contacted and patient was discharged home to her regular care.  In the ED, she was loaded with Keppra , given Versed, loaded with Vimpat .  She was also given a 1 L bolus.  Vital signs significant for tachycardia.  Neurology was consulted in the ED and recommended long-term EEG and CT head.  They also recommended checking for infectious sources which so far have been negative (urinalysis, WBC, and chest x-ray).  FMTS was called for admission.  Review Of Systems: Per HPI with the following additions: As above  Pertinent Past Medical History: Cerebral palsy Asthma Seizures Mental retardation Remainder reviewed in history tab.   Pertinent Past Surgical History: EGD 10/2023 Polypectomy 10/2023 Remainder reviewed in  history tab.   Pertinent Social History: Tobacco use: No Alcohol use: No Other Substance use: No Lives with aunt, mother, uncle, cousin   Pertinent Family History: Mother with migraines and liver cancer Remainder reviewed in history tab.   Important Outpatient  Medications: Lipitor 40 mg Klonopin  0.5 mg 3 times daily Pepcid  20 mg 2 times daily Vimpat  200 mg 2 times daily Keppra  100 mg 2 times daily Protonix  40 mg daily Perampanel  4 mg daily  Phenytoin  100 mg 3 times daily Remainder reviewed in medication history.   Objective: BP (!) 134/96   Pulse (!) 111   Temp 99.9 F (37.7 C) (Axillary)   Resp 20   Wt 71.3 kg Comment: April 2025  SpO2 100%   BMI 26.16 kg/m  Exam: General: NAD, conversant ENTM: poor dentition Cardiovascular: RRR, NRMG, S1/S2 Respiratory: CTABL, normal WOB Gastrointestinal: Soft, NTTP, non-distended MSK: Unable to move LUE and LLE Neuro: A&O x1, able to follow commands   Labs:  CBC BMET  Recent Labs  Lab 12/27/23 1827 12/27/23 1834  WBC 8.3  --   HGB 10.8* 11.2*  HCT 33.7* 33.0*  PLT 212  --    Recent Labs  Lab 12/27/23 1827 12/27/23 1834  NA 136 134*  K 3.6 3.7  CL 96* 96*  CO2 29  --   BUN 9 10  CREATININE 0.88 0.80  GLUCOSE 184* 185*  CALCIUM  8.5*  --       EKG: sinus tachycardia, normal axis without deviation, possible new t-wave elevation    Imaging Studies Performed:  CT head: pending   CXR: negative for infection   Wilhemena Harbour, MD 12/27/2023, 9:59 PM PGY-3, [facility]  FPTS Intern pager: [phone], text pages welcome Secure chat group [facility] Teaching Service

## 2023-12-27 NOTE — Progress Notes (Signed)
  LTM EEG hooked up and running - no initial skin breakdown - Not monitored by [facility] while in ER.  Head wrapped, paste application due to environmental control.

## 2023-12-27 NOTE — Assessment & Plan Note (Signed)
 EPS called.  - contact precautions

## 2023-12-28 ENCOUNTER — Inpatient Hospital Stay (HOSPITAL_COMMUNITY)

## 2023-12-28 DIAGNOSIS — Z91199 Patient's noncompliance with other medical treatment and regimen due to unspecified reason: Secondary | ICD-10-CM

## 2023-12-28 DIAGNOSIS — R569 Unspecified convulsions: Secondary | ICD-10-CM | POA: Diagnosis not present

## 2023-12-28 DIAGNOSIS — G40901 Epilepsy, unspecified, not intractable, with status epilepticus: Secondary | ICD-10-CM | POA: Diagnosis not present

## 2023-12-28 DIAGNOSIS — F79 Unspecified intellectual disabilities: Secondary | ICD-10-CM

## 2023-12-28 DIAGNOSIS — G809 Cerebral palsy, unspecified: Secondary | ICD-10-CM

## 2023-12-28 DIAGNOSIS — B888 Other specified infestations: Secondary | ICD-10-CM | POA: Diagnosis not present

## 2023-12-28 LAB — CBC
HCT: 38.4 % (ref 36.0–46.0)
Hemoglobin: 12.1 g/dL (ref 12.0–15.0)
MCH: 30.6 pg (ref 26.0–34.0)
MCHC: 31.5 g/dL (ref 30.0–36.0)
MCV: 97.2 fL (ref 80.0–100.0)
Platelets: 203 10*3/uL (ref 150–400)
RBC: 3.95 MIL/uL (ref 3.87–5.11)
RDW: 14.3 % (ref 11.5–15.5)
WBC: 8.6 10*3/uL (ref 4.0–10.5)
nRBC: 0 % (ref 0.0–0.2)

## 2023-12-28 LAB — BASIC METABOLIC PANEL WITH GFR
Anion gap: 15 (ref 5–15)
BUN: 10 mg/dL (ref 6–20)
CO2: 21 mmol/L — ABNORMAL LOW (ref 22–32)
Calcium: 8.7 mg/dL — ABNORMAL LOW (ref 8.9–10.3)
Chloride: 98 mmol/L (ref 98–111)
Creatinine, Ser: 0.86 mg/dL (ref 0.44–1.00)
GFR, Estimated: >60 mL/min (ref >60)
Glucose, Bld: 148 mg/dL — ABNORMAL HIGH (ref 70–99)
Potassium: 4.1 mmol/L (ref 3.5–5.1)
Sodium: 134 mmol/L — ABNORMAL LOW (ref 135–145)

## 2023-12-28 LAB — PHENYTOIN LEVEL, TOTAL
Phenytoin Lvl: >40 ug/mL (ref 10.0–20.0)
Phenytoin Lvl: 10.1 ug/mL (ref 10.0–20.0)

## 2023-12-28 LAB — TSH: TSH: 2.578 u[IU]/mL (ref 0.350–4.500)

## 2023-12-28 LAB — LEVETIRACETAM LEVEL: Levetiracetam Lvl: 97.8 ug/mL — ABNORMAL HIGH (ref 10.0–40.0)

## 2023-12-28 MED ORDER — PHENYTOIN 50 MG PO CHEW
100.0000 mg | CHEWABLE_TABLET | Freq: Two times a day (BID) | ORAL | Status: DC
  Administered 2023-12-29 – 2024-01-03 (×11): 100 mg via ORAL
  Filled 2023-12-28 (×12): qty 2

## 2023-12-28 MED ORDER — ORAL CARE MOUTH RINSE: 15.0000 mL | OROMUCOSAL | Status: DC | PRN

## 2023-12-28 NOTE — Plan of Care (Addendum)
 10:33pm- Informed by patient's RN of critical lab of Phenytoin  40 daily level >40.  Phenytoin .  Previously ordered 100 mg 3 times daily, switch to twice daily per note from neurology. Discussed lab finding with Dr. Alecia Ames (neurologist) who recommend repeat phenytoin  level stat. Order placed, will follow up with lab.  Addendum:  11:41pm- Repeat Phenytoin  level came back at normal range of 10.1. Spoke with Dr. Alecia Ames and agree suspect the previous level of 40 was possible an erroneous

## 2023-12-28 NOTE — Progress Notes (Signed)
LTM EEG disconnected - no skin breakdown at unhook. [facility] notified.  

## 2023-12-28 NOTE — Care Management (Addendum)
 Transition of Care [facility]) - Inpatient Brief Assessment   Patient Details  Name: Patricia White MRN: [id number] Date of Birth: Jan 20, 1971  Transition of Care [facility]) CM/SW Contact:    Ronni Colace, RN Phone Number: 12/28/2023, 2:40 PM   Clinical Narrative:  53 yo patient history of CP and seizures presented with encephalopathy and seizure activity. Suspect non adherence to dilantin  as  levels undetectable/ she was also noted with bedbugs. Contact CSW to collaborate regarding issues.  Called and left a message with [place] [facility]PS to call  back for report. In looking back at last admission, a APS report was made on 4/14 for same issues, non- adherence to medication regime.  1620 Recalled APS and made report TOC will continue to follow  Transition of Care Asessment: Insurance and Status: Insurance coverage has been reviewed Patient has primary care physician: Yes Home environment has been reviewed: caregivers Prior level of function:: Bedbound   Social Drivers of Health Review: SDOH reviewed no interventions necessary Readmission risk has been reviewed: Yes Transition of care needs: transition of care needs identified, TOC will continue to follow

## 2023-12-28 NOTE — Assessment & Plan Note (Signed)
 HTN: no home medications, monitor clinically  T2DM: last A1c 5.3 on 11/23/2022, monitor glucose with daily BMP Cerebral palsy: residual left sided weakness  HLD: continue Lipitor 40 mg  GERD: continue Pepcid  20 mg and Protonix  40 mg daily

## 2023-12-28 NOTE — Progress Notes (Addendum)
 NEUROLOGY CONSULT FOLLOW UP NOTE   Date of service: Dec 28, 2023 Patient Name: Patricia White MRN:  [id number] DOB:  1971/07/03  Interval Hx/subjective  Seen and examined.  Awake today.  Overnight EEG remains consistent with ongoing electrographic status epilepticus Vitals   Vitals:   12/28/23 0800 12/28/23 0815 12/28/23 1040 12/28/23 1046  BP: 122/87 129/85 109/64   Pulse:   (!) 124   Resp:   16   Temp:    98.3 F (36.8 C)  TempSrc:    Oral  SpO2:   99%   Weight:         Body mass index is 26.16 kg/m.  Physical Exam   General: Somewhat drowsy but able to have full participation in the exam, sitting comfortably in bed watching TV. HEENT: Normocephalic atraumatic Lungs: Clear Neurological exam She is awake alert oriented to the fact that she is in the hospital Her speech is mildly dysarthric She told me her age correctly could not tell me the month currently No evidence of aphasia Cranial nerves II to XII intact Motor examination reveals nearly full strength in the right upper and lower extremity and better minimal muscle movement in the left upper and lower extremity. Sensation intact to light touch bilaterally  Medications  Current Facility-Administered Medications:    atorvastatin  (LIPITOR) tablet 40 mg, 40 mg, Oral, Daily, Sowell, Brandon, MD, 40 mg at 12/28/23 1045   clonazePAM  (KLONOPIN ) tablet 0.5 mg, 0.5 mg, Oral, TID, Sowell, Brandon, MD, 0.5 mg at 12/28/23 1044   enoxaparin  (LOVENOX ) injection 40 mg, 40 mg, Subcutaneous, Q24H, Sowell, Brandon, MD, 40 mg at 12/27/23 2256   famotidine  (PEPCID ) tablet 20 mg, 20 mg, Oral, BID, Sowell, Brandon, MD, 20 mg at 12/28/23 1044   lacosamide  (VIMPAT ) tablet 200 mg, 200 mg, Oral, BID, Sowell, Brandon, MD, 200 mg at 12/28/23 1045   levETIRAcetam  (KEPPRA ) tablet 1,500 mg, 1,500 mg, Oral, BID, Sowell, Brandon, MD, 1,500 mg at 12/28/23 1045   omalizumab  (XOLAIR ) prefilled syringe 300 mg, 300 mg, Subcutaneous, Q28 days, Brian Campanile, MD, 300 mg at 11/19/20 1559   pantoprazole  (PROTONIX ) EC tablet 40 mg, 40 mg, Oral, Daily, Sowell, Ace Holder, MD, 40 mg at 12/28/23 1044   perampanel  (FYCOMPA ) tablet 4 mg, 4 mg, Oral, Daily, Sowell, Ace Holder, MD, 4 mg at 12/28/23 1058   phenytoin  (DILANTIN ) chewable tablet 100 mg, 100 mg, Oral, TID, Sowell, Brandon, MD, 100 mg at 12/28/23 1044   predniSONE  (DELTASONE ) tablet 20 mg, 20 mg, Oral, Q breakfast, 20 mg at 12/28/23 1044 **FOLLOWED BY** [START ON 12/30/2023] predniSONE  (DELTASONE ) tablet 10 mg, 10 mg, Oral, Q breakfast, Sowell, Brandon, MD  Current Outpatient Medications:    atorvastatin  (LIPITOR) 40 MG tablet, TAKE 1 TABLET BY MOUTH DAILY (Patient taking differently: Take 40 mg by mouth in the morning.), Disp: 100 tablet, Rfl: 2   clonazePAM  (KLONOPIN ) 0.5 MG tablet, Take 1 tablet (0.5 mg total) by mouth 3 (three) times daily., Disp: 90 tablet, Rfl: 0   famotidine  (PEPCID ) 20 MG tablet, Take 1 tablet (20 mg total) by mouth 2 (two) times daily for 14 days. (Patient taking differently: Take 20 mg by mouth daily.), Disp: 28 tablet, Rfl: 0   lacosamide  (VIMPAT ) 200 MG TABS tablet, Take 1 tablet (200 mg total) by mouth 2 (two) times daily., Disp: 60 tablet, Rfl: 0   levETIRAcetam  (KEPPRA ) 750 MG tablet, Take 2 tablets (1,500 mg total) by mouth 2 (two) times daily., Disp: 120 tablet, Rfl: 0  Nutritional Supplements (,FEEDING SUPPLEMENT, PROSOURCE PLUS) liquid, Take 30 mLs by mouth 2 (two) times daily between meals. (Patient not taking: Reported on 11/20/2023), Disp: 887 mL, Rfl: 2   pantoprazole  (PROTONIX ) 40 MG tablet, Take 1 tablet (40 mg total) by mouth daily., Disp: 30 tablet, Rfl: 0   Perampanel  4 MG TABS, Take 1 tablet (4 mg total) by mouth daily., Disp: 30 tablet, Rfl: 0   phenytoin  (DILANTIN ) 50 MG tablet, Chew 2 tablets (100 mg total) by mouth 3 (three) times daily., Disp: 180 tablet, Rfl: 0   predniSONE  (DELTASONE ) 20 MG tablet, Take 3 tablets (60 mg total) by mouth daily  with breakfast for 6 days, THEN 2.5 tablets (50 mg total) daily with breakfast for 7 days, THEN 2 tablets (40 mg total) daily with breakfast for 7 days, THEN 1.5 tablets (30 mg total) daily with breakfast for 7 days, THEN 1 tablet (20 mg total) daily with breakfast for 7 days, THEN 0.5 tablets (10 mg total) daily with breakfast for 7 days., Disp: 71 tablet, Rfl: 0   sucralfate  (CARAFATE ) 1 GM/10ML suspension, Take 10 mLs (1 g total) by mouth 4 (four) times daily -  with meals and at bedtime. (Patient not taking: Reported on 11/20/2023), Disp: 414 mL, Rfl: 2  Labs and Diagnostic Imaging   CBC:  Recent Labs  Lab 12/27/23 1827 12/27/23 1834 12/28/23 0618  WBC 8.3  --  8.6  HGB 10.8* 11.2* 12.1  HCT 33.7* 33.0* 38.4  MCV 96.0  --  97.2  PLT 212  --  203    Basic Metabolic Panel:  Lab Results  Component Value Date   NA 134 (L) 12/28/2023   K 4.1 12/28/2023   CO2 21 (L) 12/28/2023   GLUCOSE 148 (H) 12/28/2023   BUN 10 12/28/2023   CREATININE 0.86 12/28/2023   CALCIUM  8.7 (L) 12/28/2023   GFRNONAA >60 12/28/2023   GFRAA >60 01/19/2020   Lipid Panel:  Lab Results  Component Value Date   LDLCALC 121 (H) 11/23/2020   HgbA1c:  Lab Results  Component Value Date   HGBA1C 5.3 11/23/2023   INR  Lab Results  Component Value Date   INR 1.0 09/14/2019   APTT  Lab Results  Component Value Date   APTT 28 09/14/2019   AED levels:  Lab Results  Component Value Date   PHENYTOIN  <2.5 (L) 12/27/2023    CT Head without contrast(Personally reviewed): No acute process   Overnight continuous EEG Consistent with electrographic status epilepticus-generalized and maximal right frontal parietal region.  There is also continuous generalized slowing.  Of note, patient had similar EEG in the past with gradual improvement in mental status without much improvement in EEG.  This EEG finding is likely baseline for her.  Assessment   Patricia White is a 53 y.o. 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 Had a recent admission where she presented with seizures, was obtunded, EEG showed electrographic status epilepticus but despite the EEG remaining consistent with electrographic status epilepticus her examination improved.  Electrographic status epilepticus finding is likely baseline for her due to her static encephalopathy. Phenytoin  level was undetectable on presentation. Currently she is following commands, and is able to verbalize with dysarthria-likely her baseline. I would discontinue electrographic monitoring and recommend to follow her clinical exam only.  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  Continue perampanel  and clonazepam  as well-dosages as above EEG can be discontinued Continue to follow clinical exam and please recall if there is any change or concern for seizure.  May need antiseizure adjustment at that time but for now, would recommend continuing the current course. There was some concern about her coming in with bedbugs and her family taking care of her at home-will involve case management for appropriate management of placement and social issues-seizure also was likely related to medication noncompliance which might be related to the social issues. Plan was relayed to the primary team resident MD. Please call with questions as needed. ______________________________________________________________________   Leala Prince, MD [facility]

## 2023-12-28 NOTE — Care Plan (Addendum)
 FMTS Brief Progress Note  S: Presented bedside with Dr. Mikeal Alder. Patient is lying in bed, no complaints at this time. No family members bedside. She does ask if she can have some potato chips.  O: BP (!) 123/101   Pulse (!) 131   Temp 98.9 F (37.2 C) (Axillary)   Resp 19   Wt 71.3 kg Comment: April 2025  SpO2 100%   BMI 26.16 kg/m   General: Tired-appearing, lying in bed, no acute distress. HEENT: normocephalic, EOM grossly intact. Cardio: Tachycardic, regular rhythm, no murmurs on exam. Pulm: Clear anteriorly. No increased work of breathing on room air. Abdominal: bowel sounds present, soft, non-distended. Extremities: no peripheral edema. Good skin turgor. Neuro: Alert, speech somewhat difficult to understand (this is baseline). Psych:  Alert, communicative, and cooperative.  A/P: Seizure Stable currently.  Continue plans as per day team progress note. Neurology discontinued EEG earlier today.  Tachycardia Consistently in the 110-130s.  Repeat EKG this evening with sinus tachycardia, essentially unchanged from prior. Pateint does not appear dry on exam. - Consider IV fluid bolus if tachycardia worsens overnight  - Orders reviewed. Labs for AM ordered, which was adjusted as needed.  - If condition changes, plan includes reevaluation.    2130 Addendum: Left arm wound RN added images of Left arm antecubital fossa wound to media tab. This does appear chronic, approximately 1 cm in size; no significant erythema surrounding the area. - Left elbow xray tomorrow AM to further evaluate - as this is chronic-appearing, pt VSS (other than tachycardia as above) and afebrile, no indication to make care plan changes or initiate abx at this time. Low threshold to intiate abx should pt fever or have worsening vitals. - RN to initiate appropriate wound care - recommend day team to consider wound care consult 5/17  Omar Bibber, DO 12/28/2023, 8:58 PM PGY-1, [facility]  Night Resident  Please page [phone] with questions.

## 2023-12-28 NOTE — Assessment & Plan Note (Signed)
 EPS called.  - contact precautions

## 2023-12-28 NOTE — Assessment & Plan Note (Addendum)
 Neuro consulted and following, they feel her EEG is consistent with her baseline from previous studies. CT head was without acute intracranial abnormalities. Will obtain further collateral from family today.  - Continue Keppra  1,500 mg BID and Vimpat  200 mg BID - Phenytoin  100 mg chewable tablet TID - PT/OT to treat - VTE prophylaxis Lovenox   - AM CBC/BMP  - Fall precautions - Delirium precautions  - Seizure precautions  -prednisone  20 mg until Sunday, then decrease to 10 mg per taper instruction

## 2023-12-28 NOTE — Procedures (Addendum)
 Patient Name: Patricia White  MRN: [id number]  Epilepsy Attending: Arleene Lack  Referring Physician/Provider: Tona Francis, MD   Duration: 12/27/2023 2059 to 12/28/2023 1251  Patient history:  53 y.o. female with past medical history of CP/MR, seizure disorder, left spastic paresis nonverbal, recent admission for failure to thrive and dehydration as well as electrographic seizures persistent with improved clinical exam during last admission now presenting with EMS who were called in for patient having multiple seizures.  EEG to evaluate for seizure  Level of alertness: comatose/ lethargic   AEDs during EEG study: LEV, LCM, Clonazepam , PHT, Perampanel ,   Technical aspects: This EEG study was done with scalp electrodes positioned according to the 10-20 International system of electrode placement. Electrical activity was reviewed with band pass filter of 1-70Hz , sensitivity of 7 uV/mm, display speed of 66mm/sec with a 60Hz  notched filter applied as appropriate. EEG data were recorded continuously and digitally stored.  Video monitoring was available and reviewed as appropriate.  Description: EEG showed continuous generalized and maximal right frontoparietal region highly epileptiform bursts which appear periodic at 2.5-3 Hz admixed with 3-5hz  theta-delta slowing. No other clinical signs were noted. This EEG pattern is concerning for electrographic status epilepticus.    Hyperventilation and photic stimulation were not performed.      ABNORMALITY - Electrographic status epilepticus, generalized and maximal right frontoparietal region - Continuous slow, generalized   IMPRESSION: This study is concerning for electrographic status epilepticus with generalized and maximal right frontoparietal onset. Additionally there was severe diffuse encephalopathy.  Of note, patient has had similar EEG size in past with gradual improvement in mental status without much improvement in EEG. Therefore, it is  likely that this is baseline  EEG for this patient.  Tashari Schoenfelder O Sanyah Molnar

## 2023-12-28 NOTE — Progress Notes (Signed)
 Dayshift charge informed she was completing wound assessment and order to wound care.

## 2023-12-28 NOTE — Progress Notes (Signed)
     Daily Progress Note Intern Pager: [phone]  Patient name: Patricia White Medical record number: [id number] Date of birth: September 30, 1970 Age: 53 y.o. Gender: female  Primary Care Provider: Ivin Marrow, MD Consultants: Neurology Code Status: DNR?  Pt Overview and Major Events to Date:  5/15: Admitted   Assessment and Plan:  Patient is a 53 yo female w/ PMH of CP and multiple instances of status epilepticus presenting in status epilepticus vs. Multiple break through seizures, likely secondary to poor medication compliance.  Assessment & Plan Seizure [facility]) Neuro consulted and following, they feel her EEG is consistent with her baseline from previous studies. CT head was without acute intracranial abnormalities. Will obtain further collateral from family today.  - Continue Keppra  1,500 mg BID and Vimpat  200 mg BID - Phenytoin  100 mg chewable tablet TID - PT/OT to treat - VTE prophylaxis Lovenox   - AM CBC/BMP  - Fall precautions - Delirium precautions  - Seizure precautions  -prednisone  20 mg until Sunday, then decrease to 10 mg per taper instruction Infestation by bed bug EPS called.  - contact precautions  Chronic health problem HTN: no home medications, monitor clinically  T2DM: last A1c 5.3 on 11/23/2022, monitor glucose with daily BMP Cerebral palsy: residual left sided weakness  HLD: continue Lipitor 40 mg  GERD: continue Pepcid  20 mg and Protonix  40 mg daily   FEN/GI: Carb modified PPx: Lovenox  Dispo:Pending PT recommendations  pending clinical improvement .   Subjective:  Patient is awake and talkative on exam today. She denies pain or other concerns. Requests a bottle of coke.  Objective: Temp:  [98.5 F (36.9 C)-99.9 F (37.7 C)] 98.5 F (36.9 C) (05/16 0600) Pulse Rate:  [101-130] 101 (05/16 0600) Resp:  [16-32] 16 (05/16 0600) BP: (112-134)/(81-96) 129/94 (05/16 0600) SpO2:  [99 %-100 %] 99 % (05/16 0600) Weight:  [71.3 kg] 71.3 kg (05/15  2000) Physical Exam: General: Well appearing, no distress. A&O to person, place, time and situation Cardiovascular: RRR, no m/r/g Respiratory: CTAB, no increased WOB Abdomen: Flat, soft, non tender Extremities: Moves R side freely and appropriately, baseline contracture of left upper and lower extremities noted.   Laboratory: Most recent CBC Lab Results  Component Value Date   WBC 8.6 12/28/2023   HGB 12.1 12/28/2023   HCT 38.4 12/28/2023   MCV 97.2 12/28/2023   PLT 203 12/28/2023   Most recent BMP    Latest Ref Rng & Units 12/27/2023    6:34 PM  BMP  Glucose 70 - 99 mg/dL 147   BUN 6 - 20 mg/dL 10   Creatinine 8.29 - 1.00 mg/dL 5.62   Sodium 130 - 865 mmol/L 134   Potassium 3.5 - 5.1 mmol/L 3.7   Chloride 98 - 111 mmol/L 96    Rayma Calandra, DO 12/28/2023, 7:21 AM  PGY-1, [place] [facility] FPTS Intern pager: [phone], text pages welcome Secure chat group [facility] Teaching Service

## 2023-12-28 NOTE — Plan of Care (Signed)
   Problem: Education: Goal: Knowledge of General Education information will improve Description Including pain rating scale, medication(s)/side effects and non-pharmacologic comfort measures Outcome: Progressing

## 2023-12-29 ENCOUNTER — Inpatient Hospital Stay (HOSPITAL_COMMUNITY)

## 2023-12-29 DIAGNOSIS — L89029 Pressure ulcer of left elbow, unspecified stage: Secondary | ICD-10-CM | POA: Diagnosis not present

## 2023-12-29 DIAGNOSIS — R000 Tachycardia, unspecified: Secondary | ICD-10-CM

## 2023-12-29 DIAGNOSIS — R569 Unspecified convulsions: Secondary | ICD-10-CM | POA: Diagnosis not present

## 2023-12-29 DIAGNOSIS — S41102A Unspecified open wound of left upper arm, initial encounter: Secondary | ICD-10-CM | POA: Insufficient documentation

## 2023-12-29 LAB — BASIC METABOLIC PANEL WITH GFR
Anion gap: 15 (ref 5–15)
BUN: 12 mg/dL (ref 6–20)
CO2: 22 mmol/L (ref 22–32)
Calcium: 9.4 mg/dL (ref 8.9–10.3)
Chloride: 96 mmol/L — ABNORMAL LOW (ref 98–111)
Creatinine, Ser: 0.78 mg/dL (ref 0.44–1.00)
GFR, Estimated: >60 mL/min (ref >60)
Glucose, Bld: 128 mg/dL — ABNORMAL HIGH (ref 70–99)
Potassium: 3.4 mmol/L — ABNORMAL LOW (ref 3.5–5.1)
Sodium: 133 mmol/L — ABNORMAL LOW (ref 135–145)

## 2023-12-29 LAB — GLUCOSE, CAPILLARY: Glucose-Capillary: 117 mg/dL — ABNORMAL HIGH (ref 70–99)

## 2023-12-29 MED ORDER — POTASSIUM CHLORIDE CRYS ER 20 MEQ PO TBCR
40.0000 meq | EXTENDED_RELEASE_TABLET | Freq: Once | ORAL | Status: AC
  Administered 2023-12-29: 40 meq via ORAL
  Filled 2023-12-29: qty 2

## 2023-12-29 MED ORDER — MEDIHONEY WOUND/BURN DRESSING EX PSTE
1.0000 | PASTE | Freq: Every day | CUTANEOUS | Status: DC
  Administered 2023-12-29 – 2024-01-03 (×6): 1 via TOPICAL
  Filled 2023-12-29: qty 44

## 2023-12-29 NOTE — Plan of Care (Signed)
 Attempted to call patient's family again today. No answer for either number provided for the grandmother, and the Aunt's voicemail box was full.

## 2023-12-29 NOTE — Plan of Care (Signed)

## 2023-12-29 NOTE — Progress Notes (Signed)
Patient's mother updated.

## 2023-12-29 NOTE — Assessment & Plan Note (Signed)
 HTN: no home medications, monitor clinically  T2DM: last A1c 5.3 on 11/23/2022, monitor glucose with daily BMP Cerebral palsy: residual left sided weakness  HLD: continue Lipitor 40 mg  GERD: continue Pepcid  20 mg and Protonix  40 mg daily

## 2023-12-29 NOTE — TOC Progression Note (Addendum)
 Spoke to Tevin with [facility] DSS/APS who reports he visited with pt today but pt's case has not been assigned an APS worker yet and the investigation is ongoing. Per Tevin, pt's family can continue to be updated and act as surrogate decision maker for pt. RN updated.   Paullette Boston, MSW, LCSW [phone] (coverage)

## 2023-12-29 NOTE — Assessment & Plan Note (Signed)
 Baseline mental status since yesterday, neurology has signed off.  Adult Protective Services has been contacted due to medication noncompliance concerns, social work is following. - Keppra  1,500 mg BID, Vimpat  200 mg BID, Phenytoin  100 mg chewable tablet TID - PT/OT to treat - VTE prophylaxis Lovenox   - AM CBC/BMP  - Fall precautions - Delirium precautions  - Seizure precautions  -prednisone  20 mg until Sunday, then decrease to 10 mg per taper instruction

## 2023-12-29 NOTE — Plan of Care (Addendum)
 FMTS Interim Progress Note  S: Seen patient for night rounds.  She has been eating dinner.  She states her belly is hurting her.  When asked if she is having any difficulty breathing, she nods yes.  O: BP (!) 108/91 (BP Location: Right Arm)   Pulse (!) 131   Temp 98.5 F (36.9 C) (Axillary)   Resp 19   Wt 71.3 kg Comment: April 2025  SpO2 100%   BMI 26.16 kg/m   General: Sitting up in bed, eating dinner, no acute distress Cardiac: Tachycardic, regular rhythm, no murmurs rubs or gallops Respiratory: Clear to auscultation bilaterally anteriorly, no increased work of breathing on room air and was speaking with me comfortably Neurologic/psychiatric: No gross focal deficit, patient appears that mental baseline from my previous interactions with her  A/P: Seizure disorder Back to baseline mental status.  Continue current regimen per day team.  Tachycardia Has been consistently tachycardic since admission.  Of note, she was intermittently tachycardic over the last admission, as well.  She does have a open wound on her left arm as seen on admission, though she is reassuringly without fever.  Wound cultures are growing gram-positive cocci and few gram-positive rods, which could reflect normal skin flora.  Feel infection as a source is less likely at this time.  Less likely pulmonary embolism given patient is satting well on room air, appears comfortable from a respiratory perspective, and has been on Lovenox , though she does nod yes to having difficulty breathing with me and is more dependent at home which could predispose her to this.  EKG on admission also with sinus tachycardia and new Q waves most noticeable in leads I, 2, V3 through V6.  Given intellectual delay and difficulty obtaining thorough history, will collect troponin, BNP, and repeat EKG at this time, though I do expect her troponin to be mildly elevated due to her tachycardia/demand ischemia.  If normal and persistent, could consider  CTA PE study.  Remainder of plan per day team.  Dema Filler, MD 12/29/2023, 9:51 PM PGY-2, [facility] Family Medicine Service pager [phone]

## 2023-12-29 NOTE — Consult Note (Signed)
 WOC Nurse Consult Note: per MD note chronic appearing wound, ? If from previous IV  Reason for Consult: L arm wound  Wound type: full thickness of unknown etiology  Pressure Injury POA: NA not related to pressure  Measurement: see nursing flowsheet  Wound bed: 100% tan slough/fibrinous tissue  Drainage (amount, consistency, odor) tan exudate on old dressing  Periwound: intact  Dressing procedure/placement/frequency: Cleanse L arm wound with Vashe wound cleanser Timm Foot [phone]) do not rinse and allow to air dry.  Apply Medihoney to wound bed daily, cover with dry gauze and silicone foam or Kerlix roll gauze to secure.   Patient may benefit from ongoing management of this wound at wound care center.  POC discussed with bedside nurse. WOC team will not follow. Re-consult if further needs arise.   Thank you,    Ronni Colace MSN, RN-BC, [employer] [phone]

## 2023-12-29 NOTE — Assessment & Plan Note (Signed)
 Appears chronic in nature. No evidence of infection, not painful on examination. Patient unable to tell me how long it has been present.  -wound care per recommendation -follow up xrays to evaluate for underlying joint involvement.

## 2023-12-29 NOTE — Evaluation (Signed)
 Occupational Therapy Evaluation Patient Details Name: Patricia White MRN: [id number] DOB: 05-21-1971 Today's Date: 12/29/2023   History of Present Illness   Pt is a 53 y/o F presenting to ED on 5/15 with breakthrough seziures. 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     Clinical Impressions Pt questionable historian, per chart review, mobilizes without AD, was living with family. Pt currently needing 2 person assist to stand and up to max A for ADLs, mod A for bed mobility during session, HR elevated to 130's at most during session. Pt needs inconsistent with command follow, needs incr time and needs cues for redirection throughout. Pt presenting with impairments listed below, will follow acutely. Patient will benefit from continued inpatient follow up therapy, <3 hours/day to maximize safety/ind with ADL/functional mobility.      If plan is discharge home, recommend the following:   Two people to help with walking and/or transfers;A lot of help with bathing/dressing/bathroom;Assistance with cooking/housework;Direct supervision/assist for medications management;Direct supervision/assist for financial management;Assist for transportation;Supervision due to cognitive status;Help with stairs or ramp for entrance     Functional Status Assessment   Patient has had a recent decline in their functional status and demonstrates the ability to make significant improvements in function in a reasonable and predictable amount of time.     Equipment Recommendations   Other (comment) (defer)     Recommendations for Other Services   PT consult     Precautions/Restrictions   Precautions Precautions: Fall Recall of Precautions/Restrictions: Impaired Restrictions Weight Bearing Restrictions Per Provider Order: No     Mobility Bed Mobility Overal bed mobility: Needs Assistance Bed Mobility: Supine to Sit, Sit to Supine   Sidelying to sit: Mod assist             Transfers Overall transfer level: Needs assistance Equipment used: 2 person hand held assist Transfers: Sit to/from Stand Sit to Stand: Mod assist, +2 physical assistance                  Balance Overall balance assessment: Needs assistance Sitting-balance support: Feet supported, No upper extremity supported Sitting balance-Leahy Scale: Fair     Standing balance support: Single extremity supported Standing balance-Leahy Scale: Poor Standing balance comment: reliant on external support                           ADL either performed or assessed with clinical judgement   ADL Overall ADL's : Needs assistance/impaired Eating/Feeding: Minimal assistance;Bed level   Grooming: Minimal assistance;Bed level   Upper Body Bathing: Moderate assistance;Sitting   Lower Body Bathing: Maximal assistance;Sitting/lateral leans   Upper Body Dressing : Moderate assistance;Sitting   Lower Body Dressing: Maximal assistance;Sitting/lateral leans   Toilet Transfer: Moderate assistance;+2 for physical assistance   Toileting- Clothing Manipulation and Hygiene: Maximal assistance       Functional mobility during ADLs: Moderate assistance;+2 for physical assistance       Vision   Additional Comments: will further assess     Perception Perception: Not tested       Praxis Praxis: Not tested       Pertinent Vitals/Pain Pain Assessment Pain Assessment: Faces Pain Score: 4  Faces Pain Scale: Hurts little more Pain Location: LUE with movement Pain Descriptors / Indicators: Discomfort Pain Intervention(s): Limited activity within patient's tolerance, Monitored during session, Repositioned     Extremity/Trunk Assessment Upper Extremity Assessment Upper Extremity Assessment: Generalized weakness LUE Deficits / Details: Congential CP,  L hemi, painful LUE, noted wound to elbow, pt does not let OT perform PROM due to pain   Lower Extremity Assessment Lower Extremity  Assessment: Defer to PT evaluation   Cervical / Trunk Assessment Cervical / Trunk Assessment: Normal   Communication Communication Communication: Impaired Factors Affecting Communication: Reduced clarity of speech;Difficulty expressing self   Cognition Arousal: Alert Behavior During Therapy: Flat affect Cognition: History of cognitive impairments             OT - Cognition Comments: 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, required encouragement from family for OOB activity, decr attention                 Following commands: Impaired Following commands impaired: Follows one step commands inconsistently, Follows one step commands with increased time     Cueing  General Comments   Cueing Techniques: Verbal cues;Gestural cues;Tactile cues;Visual cues  HR up to 130s at max during session   Exercises     Shoulder Instructions      Home Living Family/patient expects to be discharged to:: Private residence Living Arrangements: Other relatives Available Help at Discharge: Available 24 hours/day;Family Type of Home: House Home Access: Stairs to enter [employer] of Steps: 4 Entrance Stairs-Rails: None Home Layout: One level     Bathroom Shower/Tub: Tub/shower unit;Sponge bathes at baseline   [employer]: Standard     Home Equipment: TEFL teacher Comments: home setup obtained from chart review      Prior Functioning/Environment Prior Level of Function : History of Falls (last six months)             Mobility Comments: Ind no AD ADLs Comments: pt unable to state, assume some assist with ADLs    OT Problem List: Impaired balance (sitting and/or standing);Decreased cognition;Decreased strength;Pain   OT Treatment/Interventions: Self-care/ADL training;Balance training;Therapeutic exercise;Therapeutic activities;Patient/family education      OT Goals(Current goals can be found in the care plan section)   Acute Rehab  OT Goals Patient Stated Goal: none stated OT Goal Formulation: With patient Time For Goal Achievement: 12/29/23 Potential to Achieve Goals: Good ADL Goals Pt Will Perform Upper Body Dressing: with modified independence;sitting Pt Will Perform Lower Body Dressing: with modified independence;sitting/lateral leans;sit to/from stand Pt Will Transfer to Toilet: with modified independence;ambulating;regular height toilet Additional ADL Goal #1: pt will perform bed mobility CGA in prep for ADLs   OT Frequency:  Min 1X/week    Co-evaluation PT/OT/SLP Co-Evaluation/Treatment: Yes Reason for Co-Treatment: To address functional/ADL transfers;For patient/therapist safety   OT goals addressed during session: ADL's and self-care;Strengthening/ROM      AM-PAC OT "6 Clicks" Daily Activity     Outcome Measure Help from another person eating meals?: A Little Help from another person taking care of personal grooming?: A Little Help from another person toileting, which includes using toliet, bedpan, or urinal?: A Lot Help from another person bathing (including washing, rinsing, drying)?: A Lot Help from another person to put on and taking off regular upper body clothing?: A Lot Help from another person to put on and taking off regular lower body clothing?: A Lot 6 Click Score: 14   End of Session Equipment Utilized During Treatment: Rolling walker (2 wheels) Nurse Communication: Mobility status  Activity Tolerance: Patient tolerated treatment well Patient left: in bed;with call bell/phone within reach;with bed alarm set  OT Visit Diagnosis: Unsteadiness on feet (R26.81);History of falling (Z91.81);Muscle weakness (generalized) (M62.81)  Time: 0981-1914 OT Time Calculation (min): 18 min Charges:  OT General Charges $OT Visit: 1 Visit OT Evaluation $OT Eval Low Complexity: 1 Low  Shauntavia Brackin K, OTD, OTR/L SecureChat Preferred Acute Rehab [phone]   Neel Buffone K  Koonce 12/29/2023, 11:11 AM

## 2023-12-29 NOTE — Plan of Care (Signed)
  Problem: Health Behavior/Discharge Planning: Goal: Ability to manage health-related needs will improve Outcome: Progressing   Problem: Clinical Measurements: Goal: Ability to maintain clinical measurements within normal limits will improve Outcome: Progressing Goal: Will remain free from infection Outcome: Progressing Goal: Diagnostic test results will improve Outcome: Progressing Goal: Respiratory complications will improve Outcome: Progressing Goal: Cardiovascular complication will be avoided Outcome: Progressing   Problem: Activity: Goal: Risk for activity intolerance will decrease Outcome: Progressing   Problem: Nutrition: Goal: Adequate nutrition will be maintained Outcome: Progressing   Problem: Coping: Goal: Level of anxiety will decrease Outcome: Progressing   Problem: Elimination: Goal: Will not experience complications related to bowel motility Outcome: Progressing Goal: Will not experience complications related to urinary retention Outcome: Progressing   Problem: Pain Managment: Goal: General experience of comfort will improve and/or be controlled Outcome: Progressing   Problem: Safety: Goal: Ability to remain free from injury will improve Outcome: Progressing   Problem: Skin Integrity: Goal: Risk for impaired skin integrity will decrease Outcome: Progressing   Problem: Education: Goal: Knowledge of General Education information will improve Description: Including pain rating scale, medication(s)/side effects and non-pharmacologic comfort measures Outcome: Not Progressing

## 2023-12-29 NOTE — Progress Notes (Signed)
 Patients family has called a few times today. Once the caller stated they were the brother whom is not listed in the chart. The second caller stated she was the mother, but gave the name of the grandmother who is listed in the chart as legal guardian. The third time was someone stating they were her uncle calling, whom is also not listed in the chart, then gave the phone to someone stating they were the grandmother. Although there are two contacts on the facesheet listed as emergency contacts; both are family members the patient lived with who are involved in the APS case. I have reached out to social work to get in contact with APS worker to find out if we are able to update family at this time. **Per DO Rayma Calandra; she tried calling both listed contacts earlier and neither answered or had a voicemail set up (see previous note).

## 2023-12-29 NOTE — Assessment & Plan Note (Signed)
 APS called.  Environmental services aware. - contact precautions

## 2023-12-29 NOTE — Care Management (Signed)
 APS called from 3W unit. They are collecting information for case. CSW and RNCM notified

## 2023-12-29 NOTE — Evaluation (Signed)
 Physical Therapy Evaluation Patient Details Name: Patricia White MRN: [id number] DOB: 07/10/1971 Today's Date: 12/29/2023  History of Present Illness  Pt is a 53 y/o F presenting to ED on 5/15 with breakthrough seziures. 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  Clinical Impression  Pt is presenting below baseline level of functioning though similarly to previously hospital admission. Pt is currently Mod A for bed mobility and Mod A +2 for sit to stand from EOB. Pt was only able to stand briefly and unable to progress gait. Due to pt current functional status, home set up and available assistance at home recommending skilled physical therapy services < 3 hours/day in order to address strength, balance and functional mobility to decrease risk for falls, injury, immobility, skin break down and re-hospitalization.        If plan is discharge home, recommend the following: Help with stairs or ramp for entrance;A lot of help with walking and/or transfers;Supervision due to cognitive status;Assistance with cooking/housework;Assist for transportation   Can travel by private vehicle   No    Equipment Recommendations Wheelchair (measurements PT);Wheelchair cushion (measurements PT)     Functional Status Assessment Patient has had a recent decline in their functional status and demonstrates the ability to make significant improvements in function in a reasonable and predictable amount of time.     Precautions / Restrictions Precautions Precautions: Fall Recall of Precautions/Restrictions: Impaired Precaution/Restrictions Comments: bowel incontinence, seizure precs, L hemiparesis (congenital CP) Required Braces or Orthoses: Other Brace Other Brace: LLE AFO Restrictions Weight Bearing Restrictions Per Provider Order: No      Mobility  Bed Mobility Overal bed mobility: Needs Assistance Bed Mobility: Supine to Sit, Sit to Supine   Sidelying to sit: Mod assist       Transfers Overall transfer level: Needs assistance Equipment used: 2 person hand held assist Transfers: Sit to/from Stand Sit to Stand: Mod assist, +2 physical assistance           General transfer comment: Mod A 2 person assist for momentum to get to standing at EOB. Pt was able to stand for less than 15 seconds. Pt was able to scoot up EOB at 2 person Min A    Ambulation/Gait       General Gait Details: Unable at this time. Pt only able to stand briefly      Balance Overall balance assessment: Needs assistance Sitting-balance support: Feet supported, No upper extremity supported Sitting balance-Leahy Scale: Fair Sitting balance - Comments: close supervision   Standing balance support: Single extremity supported Standing balance-Leahy Scale: Poor Standing balance comment: reliant on external support       Pertinent Vitals/Pain Pain Assessment Pain Assessment: Faces Faces Pain Scale: Hurts little more Facial Expression: Relaxed, neutral Body Movements: Absence of movements Muscle Tension: Relaxed Compliance with ventilator (intubated pts.): N/A Vocalization (extubated pts.): Talking in normal tone or no sound CPOT Total: 0 Pain Location: LUE with movement Pain Descriptors / Indicators: Discomfort Pain Intervention(s): Monitored during session, Limited activity within patient's tolerance    Home Living Family/patient expects to be discharged to:: Private residence Living Arrangements: Other relatives Available Help at Discharge: Available 24 hours/day;Family Type of Home: House Home Access: Stairs to enter Entrance Stairs-Rails: None Entrance Stairs-Number of Steps: 4   Home Layout: One level Home Equipment: Shower seat Additional Comments: home setup obtained from chart review    Prior Function Prior Level of Function : History of Falls (last six months)  Mobility Comments: Ind no AD ADLs Comments: pt unable to state, assume some  assist with ADLs     Extremity/Trunk Assessment   Upper Extremity Assessment Upper Extremity Assessment: Defer to OT evaluation LUE Deficits / Details: Congential CP, L hemi, painful LUE, noted wound to elbow, pt does not let OT perform PROM due to pain    Lower Extremity Assessment Lower Extremity Assessment: Generalized weakness LLE Deficits / Details: 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 Following commands: Impaired Following commands impaired: Follows one step commands inconsistently, Follows one step commands with increased time     Cueing Cueing Techniques: Verbal cues, Gestural cues, Tactile cues, Visual cues     General Comments General comments (skin integrity, edema, etc.): HR up to 130's after mobility        Assessment/Plan    PT Assessment Patient needs continued PT services  PT Problem List Decreased strength;Decreased range of motion;Decreased activity tolerance;Decreased balance;Decreased mobility;Decreased cognition       PT Treatment Interventions DME instruction;Gait training;Functional mobility training;Stair training;Therapeutic activities;Therapeutic exercise;Balance training;Cognitive remediation;Patient/family education    PT Goals (Current goals can be found in the Care Plan section)  Acute Rehab PT Goals PT Goal Formulation: Patient unable to participate in goal setting Time For Goal Achievement:  01/12/24 Potential to Achieve Goals: Fair    Frequency Min 2X/week     Co-evaluation PT/OT/SLP Co-Evaluation/Treatment: Yes Reason for Co-Treatment: To address functional/ADL transfers;For patient/therapist safety PT goals addressed during session: Mobility/safety with mobility;Strengthening/ROM;Balance OT goals addressed during session: ADL's and self-care;Strengthening/ROM       AM-PAC PT "6 Clicks" Mobility  Outcome Measure Help needed turning from your back to your side while in a flat bed without using bedrails?: A Lot Help needed moving from lying on your back to sitting on the side of a flat bed without using bedrails?: A Lot Help needed moving to and from a bed to a chair (including a wheelchair)?: A Lot Help needed standing up from a chair using your arms (e.g., wheelchair or bedside chair)?: A Lot Help needed to walk in hospital room?: Total Help needed climbing 3-5 steps with a railing? : Total 6 Click Score: 10    End of Session Equipment Utilized During Treatment: Gait belt Activity Tolerance: Patient tolerated treatment well;Patient limited by lethargy Patient left: in bed;with call bell/phone within reach;with bed alarm set Nurse Communication: Mobility status PT Visit Diagnosis: Other abnormalities of gait and mobility (R26.89);Hemiplegia and hemiparesis;Muscle weakness (generalized) (M62.81) Hemiplegia - Right/Left: Left Hemiplegia - dominant/non-dominant: Non-dominant Hemiplegia - caused by: Unspecified    Time: 4098-1191 PT Time Calculation (min) (ACUTE ONLY): 18 min   Charges:   PT Evaluation $PT Eval Low Complexity: 1 Low   PT General Charges $$ ACUTE PT VISIT: 1 Visit       Sloan Duncans, DPT, CLT  Acute Rehabilitation Services Office: [phone] (Secure chat preferred)   Jenice Mitts 12/29/2023, 1:12 PM

## 2023-12-29 NOTE — Progress Notes (Signed)
     Daily Progress Note Intern Pager: [phone]  Patient name: Patricia White Medical record number: [id number] Date of birth: [birth year] Age: 53 y.o. Gender: female  Primary Care Provider: Ivin Marrow, MD Consultants: Neurology, signed off Code Status: DNR  Pt Overview and Major Events to Date:  5/15: Admitted  Assessment and Plan:  Patient is a 53 year old female with past medical history of CP and multiple instances of status epilepticus presenting with significant breakthrough seizures/status secondary to poor medication compliance.  Neurology has signed off at this time. Assessment & Plan Seizure [facility]) Baseline mental status since yesterday, neurology has signed off.  Adult Protective Services has been contacted due to medication noncompliance concerns, social work is following. - Keppra  1,500 mg BID, Vimpat  200 mg BID, Phenytoin  100 mg chewable tablet TID - PT/OT to treat - VTE prophylaxis Lovenox   - AM CBC/BMP  - Fall precautions - Delirium precautions  - Seizure precautions  -prednisone  20 mg until Sunday, then decrease to 10 mg per taper instruction Infestation by bed bug APS called.  Environmental services aware. - contact precautions  Open wound of left upper arm Appears chronic in nature. No evidence of infection, not painful on examination. Patient unable to tell me how long it has been present.  -wound care per recommendation -follow up xrays to evaluate for underlying joint involvement. Chronic health problem HTN: no home medications, monitor clinically  T2DM: last A1c 5.3 on 11/23/2022, monitor glucose with daily BMP Cerebral palsy: residual left sided weakness  HLD: continue Lipitor 40 mg  GERD: continue Pepcid  20 mg and Protonix  40 mg daily   FEN/GI: Carb modified PPx: lovenox  Dispo:Pending social work and PT consults pending clinical improvement .   Subjective:  Patient feels well, continues to try and give me her lunch order rather than answer  questions. No complaints other than wanting a soda.  Objective: Temp:  [98.1 F (36.7 C)-98.9 F (37.2 C)] 98.5 F (36.9 C) (05/17 0355) Pulse Rate:  [123-136] 131 (05/16 1853) Resp:  [16-21] 19 (05/16 1853) BP: (95-134)/(61-104) 128/104 (05/17 0355) SpO2:  [99 %-100 %] 100 % (05/16 1853) Physical Exam: General: Well appearing, no distress Cardiovascular: Tachycardic, no m/r/g Respiratory: CTAB, no increased WOB Abdomen: Flat, soft, non tender Extremities: Warm, well perfused. No edema. L arm with 1.5 x 1.5 cm well demarkated ucleration in the antecubital fossa. Dry, tan base, fibrinous exudate. No surrounding warmth or erythema.   Laboratory: Most recent CBC Lab Results  Component Value Date   WBC 8.6 12/28/2023   HGB 12.1 12/28/2023   HCT 38.4 12/28/2023   MCV 97.2 12/28/2023   PLT 203 12/28/2023   Most recent BMP    Latest Ref Rng & Units 12/28/2023    6:18 AM  BMP  Glucose 70 - 99 mg/dL 130   BUN 6 - 20 mg/dL 10   Creatinine 8.65 - 1.00 mg/dL 7.84   Sodium 696 - 295 mmol/L 134   Potassium 3.5 - 5.1 mmol/L 4.1   Chloride 98 - 111 mmol/L 98   CO2 22 - 32 mmol/L 21   Calcium  8.9 - 10.3 mg/dL 8.7     Rayma Calandra, DO 12/29/2023, 7:18 AM  PGY-1, [facility] FPTS Intern pager: [phone], text pages welcome Secure chat group [facility] Teaching Service

## 2023-12-30 ENCOUNTER — Inpatient Hospital Stay (HOSPITAL_COMMUNITY)

## 2023-12-30 DIAGNOSIS — R220 Localized swelling, mass and lump, head: Secondary | ICD-10-CM | POA: Insufficient documentation

## 2023-12-30 DIAGNOSIS — R569 Unspecified convulsions: Secondary | ICD-10-CM | POA: Diagnosis not present

## 2023-12-30 LAB — BASIC METABOLIC PANEL WITH GFR
Anion gap: 11 (ref 5–15)
BUN: 13 mg/dL (ref 6–20)
CO2: 23 mmol/L (ref 22–32)
Calcium: 8.7 mg/dL — ABNORMAL LOW (ref 8.9–10.3)
Chloride: 102 mmol/L (ref 98–111)
Creatinine, Ser: 0.82 mg/dL (ref 0.44–1.00)
GFR, Estimated: >60 mL/min
Glucose, Bld: 119 mg/dL — ABNORMAL HIGH (ref 70–99)
Potassium: 4.2 mmol/L (ref 3.5–5.1)
Sodium: 136 mmol/L (ref 135–145)

## 2023-12-30 LAB — BRAIN NATRIURETIC PEPTIDE: B Natriuretic Peptide: 16 pg/mL (ref 0.0–100.0)

## 2023-12-30 LAB — TROPONIN I (HIGH SENSITIVITY): Troponin I (High Sensitivity): 4 ng/L (ref <18)

## 2023-12-30 MED ORDER — IOHEXOL 350 MG/ML SOLN
75.0000 mL | Freq: Once | INTRAVENOUS | Status: AC | PRN
  Administered 2023-12-30: 75 mL via INTRAVENOUS

## 2023-12-30 NOTE — Assessment & Plan Note (Signed)
 New this admission based solely on my exam at her prior admission. Given poor dental health, concern for parotitis with pain on exam, though patient reassuringly afebrile. Could be contributing to poor clinical status. -CT maxillofacial for further evaluation

## 2023-12-30 NOTE — Assessment & Plan Note (Addendum)
 Patient has remained tachycardic throughout admission.  Of note, she was tachycardic intermittently over her last admission, as well. Troponins flat, though felt EKG was with new Q waves from prior admission.  Unfortunately patient is difficult historian given intellectual delay to fully parse out symptomatology; she does nod her head yes when asked if she is short of breath, though she is satting well on room air and chest x-ray appeared without acute consolidation. She does have facial swelling that is new from when I saw her with tenderness, which could reflect a source of infection (see problem below). BNP normal.  She is taking good p.o., so feel dehydration is less likely. -Continue cardiac monitoring - Consider CTA PE if persists and no further evidence of infection

## 2023-12-30 NOTE — Plan of Care (Signed)
  Problem: Education: Goal: Knowledge of General Education information will improve Description: Including pain rating scale, medication(s)/side effects and non-pharmacologic comfort measures Outcome: Progressing   Problem: Health Behavior/Discharge Planning: Goal: Ability to manage health-related needs will improve Outcome: Not Progressing   Problem: Clinical Measurements: Goal: Ability to maintain clinical measurements within normal limits will improve Outcome: Progressing Goal: Will remain free from infection Outcome: Progressing Goal: Diagnostic test results will improve Outcome: Progressing Goal: Respiratory complications will improve Outcome: Progressing Goal: Cardiovascular complication will be avoided Outcome: Progressing   Problem: Activity: Goal: Risk for activity intolerance will decrease Outcome: Not Progressing   Problem: Nutrition: Goal: Adequate nutrition will be maintained Outcome: Progressing   Problem: Coping: Goal: Level of anxiety will decrease Outcome: Not Progressing   Problem: Elimination: Goal: Will not experience complications related to bowel motility Outcome: Progressing Goal: Will not experience complications related to urinary retention Outcome: Progressing   Problem: Pain Managment: Goal: General experience of comfort will improve and/or be controlled Outcome: Progressing   Problem: Safety: Goal: Ability to remain free from injury will improve Outcome: Progressing   Problem: Skin Integrity: Goal: Risk for impaired skin integrity will decrease Outcome: Progressing

## 2023-12-30 NOTE — Assessment & Plan Note (Signed)
 X-ray collected yesterday reassuringly without definitive subcutaneous air, radiopaque foreign body, cortical erosion or acute fracture.  Less likely infected at this time. -wound care per recommendation

## 2023-12-30 NOTE — Assessment & Plan Note (Signed)
 Baseline mental status since yesterday, neurology has signed off.  Adult Protective Services has been contacted due to medication noncompliance concerns, social work is following. - Keppra  1,500 mg BID, Vimpat  200 mg BID, Phenytoin  100 mg chewable tablet TID - PT/OT - AM CBC/BMP  - Fall precautions - Delirium precautions  - Seizure precautions  -prednisone  20 mg until Sunday, then decrease to 10 mg per taper instruction

## 2023-12-30 NOTE — TOC Progression Note (Signed)
 Transition of Care [facility]) - Progression Note    Patient Details  Name: Patricia White MRN: [id number] Date of Birth: 13-Dec-1970  Transition [facility]) CM/SW Contact  Carmon Christen, LCSWA Phone Number: 12/30/2023, 12:53 PM  Clinical Narrative:     CSW received consult for possible SNF placement at time of discharge for patient.CSW unable to reach [place] patients Grandmother. CSW called patients aunt delores and unable to leave voicemail. CSW will try back to discuss PT recommendations of SNF placement for patient when able.CSW to continue to follow and assist with discharge planning needs.        Expected Discharge Plan and Services                                               Social Determinants of Health (SDOH) Interventions SDOH Screenings   Food Insecurity: Patient Unable To Answer (12/28/2023)  Housing: Patient Unable To Answer (12/28/2023)  Transportation Needs: Patient Unable To Answer (12/28/2023)  Utilities: Patient Unable To Answer (12/28/2023)  Alcohol Screen: Low Risk  (03/24/2023)  Depression (PHQ2-9): Low Risk  (08/09/2023)  Financial Resource Strain: Low Risk  (03/24/2023)  Physical Activity: Insufficiently Active (03/24/2023)  Social Connections: Moderately Isolated (11/19/2023)  Stress: No Stress Concern Present (03/24/2023)  Tobacco Use: Low Risk  (11/19/2023)  Health Literacy: Adequate Health Literacy (03/24/2023)    Readmission Risk Interventions    11/16/2023   11:48 AM  Readmission Risk Prevention Plan  Transportation Screening Complete  PCP or Specialist Appt within 5-7 Days Complete  Home Care Screening Complete  Medication Review (RN CM) Complete

## 2023-12-30 NOTE — Progress Notes (Signed)
 Daily Progress Note Intern Pager: [phone]  Patient name: Patricia White Medical record number: [id number] Date of birth: January 31, 1971 Age: 53 y.o. Gender: female  Primary Care Provider: Ivin Marrow, MD Consultants: Neurology (signed off) Code Status: DNR  Pt Overview and Major Events to Date:  5/5-admitted  Assessment and Plan:  Patient is a 53 year old female with past medical history of cerebral palsy and multiple instances of electrographic status epilepticus presenting with significant breakthrough seizures/status secondary to poor medication compliance. Assessment & Plan Seizure [facility]) Baseline mental status since yesterday, neurology has signed off.  Adult Protective Services has been contacted due to medication noncompliance concerns, social work is following. - Keppra  1,500 mg BID, Vimpat  200 mg BID, Phenytoin  100 mg chewable tablet TID - PT/OT - AM CBC/BMP  - Fall precautions - Delirium precautions  - Seizure precautions  -prednisone  20 mg until Sunday, then decrease to 10 mg per taper instruction Infestation by bed bug APS called.  Environmental services aware. - contact precautions  -Await further instructions by APS Open wound of left upper arm X-ray collected yesterday reassuringly without definitive subcutaneous air, radiopaque foreign body, cortical erosion or acute fracture.  Less likely infected at this time. -wound care per recommendation Sinus tachycardia Patient has remained tachycardic throughout admission.  Of note, she was tachycardic intermittently over her last admission, as well. Troponins flat, though felt EKG was with new Q waves from prior admission.  Unfortunately patient is difficult historian given intellectual delay to fully parse out symptomatology; she does nod her head yes when asked if she is short of breath, though she is satting well on room air and chest x-ray appeared without acute consolidation. She does have facial swelling that  is new from when I saw her with tenderness, which could reflect a source of infection (see problem below). BNP normal.  She is taking good p.o., so feel dehydration is less likely. -Continue cardiac monitoring - Consider CTA PE if persists and no further evidence of infection Facial swelling New this admission based solely on my exam at her prior admission. Given poor dental health, concern for parotitis with pain on exam, though patient reassuringly afebrile. Could be contributing to poor clinical status. -CT maxillofacial for further evaluation Chronic health problem HTN: no home medications, monitor clinically  T2DM: last A1c 5.3 on 11/23/2022, monitor glucose with daily BMP Cerebral palsy: residual left sided weakness  HLD: continue Lipitor 40 mg  GERD: continue Pepcid  20 mg and Protonix  40 mg daily   FEN/GI: Carb modified PPx: Lovenox  Dispo: Pending social work/APS, PT consults  Subjective:  Patient with concerns of pain to palpation of her face bilaterally when I press.  Otherwise just wants breakfast this morning.  Objective: Temp:  [97.6 F (36.4 C)-98.5 F (36.9 C)] 98.2 F (36.8 C) (05/18 0003) Pulse Rate:  [101] 101 (05/18 0003) Resp:  [21] 21 (05/18 0003) BP: (108-117)/(86-96) 117/96 (05/18 0003) SpO2:  [100 %] 100 % (05/18 0003) Physical Exam: General: Sitting up in bed, no acute distress HEENT: Bilateral firm mandibular swelling with mild pain to palpation bilaterally when compared to prior exam, poor dentition Cardiovascular: Tachycardic, regular rhythm, no murmurs rubs or gallops Respiratory: Clear to auscultation bilaterally anteriorly without wheezes rales or rhonchi  Laboratory: Most recent CBC Lab Results  Component Value Date   WBC 8.6 12/28/2023   HGB 12.1 12/28/2023   HCT 38.4 12/28/2023   MCV 97.2 12/28/2023   PLT 203 12/28/2023   Most recent BMP  Latest Ref Rng & Units 12/29/2023    5:51 AM  BMP  Glucose 70 - 99 mg/dL 161   BUN 6 - 20 mg/dL  12   Creatinine 0.96 - 1.00 mg/dL 0.45   Sodium 409 - 811 mmol/L 133   Potassium 3.5 - 5.1 mmol/L 3.4   Chloride 98 - 111 mmol/L 96   CO2 22 - 32 mmol/L 22   Calcium  8.9 - 10.3 mg/dL 9.4    BNP 16 Troponin 4  Imaging/Diagnostic Tests: X-ray left elbow IMPRESSION: 1. Bandage material overlying the radial volar elbow soft tissues. No definitive subcutaneous air is seen. No radiopaque foreign body is seen. 2. No cortical erosion or acute fracture.  Dema Filler, MD 12/30/2023, 5:48 AM  PGY-2, [place] [facility] FPTS Intern pager: [phone], text pages welcome Secure chat group [facility] Teaching Service

## 2023-12-30 NOTE — Assessment & Plan Note (Signed)
 APS called.  Environmental services aware. - contact precautions  -Await further instructions by APS

## 2023-12-30 NOTE — Assessment & Plan Note (Signed)
 HTN: no home medications, monitor clinically  T2DM: last A1c 5.3 on 11/23/2022, monitor glucose with daily BMP Cerebral palsy: residual left sided weakness  HLD: continue Lipitor 40 mg  GERD: continue Pepcid  20 mg and Protonix  40 mg daily

## 2023-12-31 DIAGNOSIS — L98499 Non-pressure chronic ulcer of skin of other sites with unspecified severity: Secondary | ICD-10-CM | POA: Diagnosis not present

## 2023-12-31 DIAGNOSIS — R569 Unspecified convulsions: Secondary | ICD-10-CM | POA: Diagnosis not present

## 2023-12-31 LAB — D-DIMER, QUANTITATIVE: D-Dimer, Quant: <0.27 ug{FEU}/mL (ref 0.00–0.50)

## 2023-12-31 NOTE — Assessment & Plan Note (Signed)
 CT maxillofacial without any evidence of significant acute findings.  Read as similar to prior study done in 2021 where patient had bilateral parotid swelling with no evidence of obstructing stone or other acute process.

## 2023-12-31 NOTE — Progress Notes (Signed)
     Daily Progress Note Intern Pager: [phone]  Patient name: Patricia White Medical record number: [id number] Date of birth: 1971/05/26 Age: 53 y.o. Gender: female  Primary Care Provider: Ivin Marrow, MD Consultants: Neurology (signed off) Code Status: DNR  Pt Overview and Major Events to Date:  5/5: Admitted  Assessment and Plan:  This is a 53 year old female patient with PMH of CP and multiple instances of electrographic status epilepticus presenting with significant breakthrough seizures/status secondary to poor medication compliance.  Medically stable at this time pending SNF approval and placement. Assessment & Plan Seizures (HCC) Mental status continues to be baseline, neurology signed off.  Adult Protective Services has been contacted and social work is following due to medication noncompliance concerns. - Keppra  1,500 mg BID, Vimpat  200 mg BID, Phenytoin  100 mg chewable tablet TID - PT/OT - AM CBC/BMP  - Fall precautions - Delirium precautions  - Seizure precautions  -prednisone  10 mg until 5/24 then discontinue Infestation by bed bug APS called.  Environmental services aware. - contact precautions  -Await further instructions by APS Open wound of left upper arm Stable. Xray findings reassuring for superficial wound without deep tissue involvement. -wound care per recommendation Sinus tachycardia Continued mild tachycardia throughout the day yesterday and overnight until around midnight. Overall patient feels well.  -Continue cardiac monitoring - Consider d-dimer if she remains tachy Facial swelling CT maxillofacial without any evidence of significant acute findings.  Read as similar to prior study done in 2021 where patient had bilateral parotid swelling with no evidence of obstructing stone or other acute process. Chronic health problem HTN: no home medications, monitor clinically  T2DM: last A1c 5.3 on 11/23/2022, monitor glucose with daily BMP Cerebral  palsy: residual left sided weakness  HLD: continue Lipitor 40 mg  GERD: continue Pepcid  20 mg and Protonix  40 mg daily   FEN/GI: Carb Modified PPx: lovenox  Dispo:SNF pending clinical improvement . Barriers include social work involvement.   Subjective:  Feeling well, no complaints.  Requests her chin hairs be shaved.  Objective: Temp:  [97.5 F (36.4 C)-98.3 F (36.8 C)] 98.3 F (36.8 C) (05/19 0453) Pulse Rate:  [89-121] 89 (05/19 0453) Resp:  [17-19] 18 (05/19 0453) BP: (99-119)/(68-84) 99/68 (05/19 0453) SpO2:  [98 %-100 %] 98 % (05/19 0453) Physical Exam: General: Well-appearing, baseline mental status HENT: Previously noted bilateral nontender parotid swelling. No other appreciable lymphadenopathy. Poor dentition.  Cardiovascular: RRR, no M/R/G Respiratory: CTAB, no increased work of breathing Abdomen: Flat, soft, nontender Extremities: 2+ peripheral pulses, no swelling  Laboratory: Most recent CBC Lab Results  Component Value Date   WBC 8.6 12/28/2023   HGB 12.1 12/28/2023   HCT 38.4 12/28/2023   MCV 97.2 12/28/2023   PLT 203 12/28/2023   Most recent BMP    Latest Ref Rng & Units 12/30/2023    8:19 AM  BMP  Glucose 70 - 99 mg/dL 027   BUN 6 - 20 mg/dL 13   Creatinine 2.53 - 1.00 mg/dL 6.64   Sodium 403 - 474 mmol/L 136   Potassium 3.5 - 5.1 mmol/L 4.2   Chloride 98 - 111 mmol/L 102   CO2 22 - 32 mmol/L 23   Calcium  8.9 - 10.3 mg/dL 8.7     Rayma Calandra, DO 12/31/2023, 7:30 AM  PGY-1, [place] [facility] FPTS Intern pager: [phone], text pages welcome Secure chat group [facility] Teaching Service

## 2023-12-31 NOTE — TOC PASRR Note (Signed)
 Name: Patricia White DOB: 1971/01/17  Please be advised that the above-named patient will require a short-term nursing home stay -- anticipated 30 days or less for rehabilitation and strengthening. The plan is for return home.

## 2023-12-31 NOTE — Assessment & Plan Note (Signed)
 HTN: no home medications, monitor clinically  T2DM: last A1c 5.3 on 11/23/2022, monitor glucose with daily BMP Cerebral palsy: residual left sided weakness  HLD: continue Lipitor 40 mg  GERD: continue Pepcid  20 mg and Protonix  40 mg daily

## 2023-12-31 NOTE — TOC Progression Note (Signed)
 Transition of Care [facility]) - Progression Note    Patient Details  Name: Patricia White MRN: [id number] Date of Birth: September 01, 1970  Transition of Care [facility]) CM/SW Contact  Tandy Fam, [place] Phone Number: 12/31/2023, 11:55 AM  Clinical Narrative:   CSW spoke with patient's aunt [name] and grandmother/legal guardian Robley Chow via telephone. CSW explained recommendation for SNF placement, and family is in agreement. No preference for facility choice. CSW explained referral process, and will provide CMS choice list with bed offers. CSW completed referral, and faxed out. Awaiting NCMust review for PASRR, will upload documents when MD has signed. CSW to follow.    Expected Discharge Plan: Skilled Nursing Facility    Expected Discharge Plan and Services                                               Social Determinants of Health (SDOH) Interventions SDOH Screenings   Food Insecurity: Patient Unable To Answer (12/28/2023)  Housing: Patient Unable To Answer (12/28/2023)  Transportation Needs: Patient Unable To Answer (12/28/2023)  Utilities: Patient Unable To Answer (12/28/2023)  Alcohol Screen: Low Risk  (03/24/2023)  Depression (PHQ2-9): Low Risk  (08/09/2023)  Financial Resource Strain: Low Risk  (03/24/2023)  Physical Activity: Insufficiently Active (03/24/2023)  Social Connections: Moderately Isolated (11/19/2023)  Stress: No Stress Concern Present (03/24/2023)  Tobacco Use: Low Risk  (11/19/2023)  Health Literacy: Adequate Health Literacy (03/24/2023)    Readmission Risk Interventions    11/16/2023   11:48 AM  Readmission Risk Prevention Plan  Transportation Screening Complete  PCP or Specialist Appt within 5-7 Days Complete  Home Care Screening Complete  Medication Review (RN CM) Complete

## 2023-12-31 NOTE — Plan of Care (Signed)
  Problem: Education: Goal: Knowledge of General Education information will improve Description: Including pain rating scale, medication(s)/side effects and non-pharmacologic comfort measures Outcome: Not Progressing   Problem: Health Behavior/Discharge Planning: Goal: Ability to manage health-related needs will improve Outcome: Not Progressing   Problem: Clinical Measurements: Goal: Ability to maintain clinical measurements within normal limits will improve Outcome: Not Progressing Goal: Will remain free from infection Outcome: Progressing Goal: Diagnostic test results will improve Outcome: Progressing Goal: Respiratory complications will improve Outcome: Progressing   Problem: Activity: Goal: Risk for activity intolerance will decrease Outcome: Progressing   Problem: Nutrition: Goal: Adequate nutrition will be maintained Outcome: Progressing   Problem: Coping: Goal: Level of anxiety will decrease Outcome: Progressing   Problem: Elimination: Goal: Will not experience complications related to bowel motility Outcome: Progressing Goal: Will not experience complications related to urinary retention Outcome: Progressing   Problem: Pain Managment: Goal: General experience of comfort will improve and/or be controlled Outcome: Progressing   Problem: Safety: Goal: Ability to remain free from injury will improve Outcome: Progressing   Problem: Skin Integrity: Goal: Risk for impaired skin integrity will decrease Outcome: Progressing

## 2023-12-31 NOTE — Assessment & Plan Note (Addendum)
 Stable. Xray findings reassuring for superficial wound without deep tissue involvement. -wound care per recommendation

## 2023-12-31 NOTE — Assessment & Plan Note (Signed)
 Mental status continues to be baseline, neurology signed off.  Adult Protective Services has been contacted and social work is following due to medication noncompliance concerns. - Keppra  1,500 mg BID, Vimpat  200 mg BID, Phenytoin  100 mg chewable tablet TID - PT/OT - AM CBC/BMP  - Fall precautions - Delirium precautions  - Seizure precautions  -prednisone  10 mg until 5/24 then discontinue

## 2023-12-31 NOTE — Assessment & Plan Note (Signed)
 APS called.  Environmental services aware. - contact precautions  -Await further instructions by APS

## 2023-12-31 NOTE — NC FL2 (Signed)
 [place]  MEDICAID FL2 LEVEL OF CARE FORM     IDENTIFICATION  Patient Name: Patricia White Birthdate: [birth year] Sex: female Admission Date (Current Location): 12/27/2023  [facility] and [place] Number:  Producer, television/film/video and Address:  [facility] [place], [address] [place], [place] [address]      Provider Number: [id number]  Attending Physician Name and Address:  Arn Lane, MD  Relative Name and Phone Number:       Current Level of Care: Hospital Recommended Level of Care: Skilled Nursing Facility Prior Approval Number:    Date Approved/Denied:   PASRR Number: Manual review  Discharge Plan: SNF    Current Diagnoses: Patient Active Problem List   Diagnosis Date Noted   Facial swelling 12/30/2023   Open wound of left upper arm 12/29/2023   Sinus tachycardia 12/29/2023   Pressure injury of skin of left elbow 12/29/2023   Intellectual disability 12/28/2023   Cerebral palsy (HCC) 12/28/2023   Infestation by bed bug 12/27/2023   Seizure-like activity (HCC) 12/27/2023   Left arm pain 11/24/2023   Hypotension 11/23/2023   Left arm swelling 11/23/2023   Status epilepticus (HCC) 11/19/2023   Pancytopenia (HCC) 11/11/2023   Hypomagnesemia 11/09/2023   Dehydration 11/08/2023   Gastritis and gastroduodenitis 11/08/2023   Gastric polyp 11/08/2023   Nausea without vomiting 11/08/2023   Protein-calorie malnutrition, severe 11/08/2023   Decreased appetite 11/05/2023   Lower urinary tract infectious disease 11/01/2023   Chronic health problem 11/01/2023   Oral thrush 11/01/2023   FTT (failure to thrive) in adult 11/01/2023   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   Hot flashes due to menopause 06/30/2021   Hyperlipidemia associated with type 2 diabetes mellitus (HCC) 11/24/2020   Diabetes mellitus without complication (HCC) 11/23/2020   Angioedema 12/10/2019   Lesion of mandible 12/10/2019   Seasonal allergies 11/20/2019   Essential  hypertension 10/23/2019   Seizures (HCC) 02/23/2016   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   Obesity (BMI 30.0-34.9) 10/13/2009   Infantile cerebral palsy (HCC) 10/11/2006    Orientation RESPIRATION BLADDER Height & Weight     Self, Situation, Place  Normal Incontinent Weight: 157 lb 3 oz (71.3 kg) (April 2025) Height:     BEHAVIORAL SYMPTOMS/MOOD NEUROLOGICAL BOWEL NUTRITION STATUS    Convulsions/Seizures Incontinent Diet (carb modified)  AMBULATORY STATUS COMMUNICATION OF NEEDS Skin   Extensive Assist Verbally Other (Comment) (open wound, left elbow: vashe, kerlix, gauze dressing change daily)                       Personal Care Assistance Level of Assistance  Bathing, Feeding, Dressing Bathing Assistance: Maximum assistance Feeding assistance: Limited assistance Dressing Assistance: Maximum assistance     Functional Limitations Warehouse manager, Speech Sight Info: Impaired   Speech Info: Impaired (dysarthria)    SPECIAL CARE FACTORS FREQUENCY  PT (By licensed PT), OT (By licensed OT)     PT Frequency: 5x/wk OT Frequency: 5x/wk            Contractures Contractures Info: Not present    Additional Factors Info  Code Status, Allergies, Psychotropic Code Status Info: DNR Allergies Info: NKA Psychotropic Info: Klonopin  0.5mg  3x/day         Current Medications (12/31/2023):  This is the current hospital active medication list Current Facility-Administered Medications  Medication Dose Route Frequency Provider Last Rate Last Admin   atorvastatin  (LIPITOR) tablet 40 mg  40 mg Oral  Daily Sowell, Brandon, MD   40 mg at 12/31/23 1033   clonazePAM  (KLONOPIN ) tablet 0.5 mg  0.5 mg Oral TID Sowell, Brandon, MD   0.5 mg at 12/31/23 1032   enoxaparin  (LOVENOX ) injection 40 mg  40 mg Subcutaneous Q24H Sowell, Brandon, MD   40 mg at 12/30/23 2305   famotidine  (PEPCID ) tablet 20 mg  20 mg Oral BID Sowell, Brandon, MD   20  mg at 12/31/23 1033   lacosamide  (VIMPAT ) tablet 200 mg  200 mg Oral BID Sowell, Brandon, MD   200 mg at 12/31/23 1033   leptospermum manuka honey (MEDIHONEY) paste 1 Application  1 Application Topical Daily Arn Lane, MD   1 Application at 12/31/23 1033   levETIRAcetam  (KEPPRA ) tablet 1,500 mg  1,500 mg Oral BID Sowell, Brandon, MD   1,500 mg at 12/31/23 1031   Oral care mouth rinse  15 mL Mouth Rinse PRN Penni Bowman T, MD       pantoprazole  (PROTONIX ) EC tablet 40 mg  40 mg Oral Daily Sowell, Brandon, MD   40 mg at 12/31/23 1033   perampanel  (FYCOMPA ) tablet 4 mg  4 mg Oral Daily Sowell, Brandon, MD   4 mg at 12/31/23 1031   phenytoin  (DILANTIN ) chewable tablet 100 mg  100 mg Oral BID Omar Bibber, DO   100 mg at 12/31/23 1032   predniSONE  (DELTASONE ) tablet 10 mg  10 mg Oral Q breakfast Sowell, Brandon, MD   10 mg at 12/31/23 8295     Discharge Medications: Please see discharge summary for a list of discharge medications.  Relevant Imaging Results:  Relevant Lab Results:   Additional Information SS#: [id number]  Tandy Fam, LCSW

## 2023-12-31 NOTE — Assessment & Plan Note (Addendum)
 Continued mild tachycardia throughout the day yesterday and overnight until around midnight. Overall patient feels well.  -Continue cardiac monitoring - Consider d-dimer if she remains tachy

## 2024-01-01 DIAGNOSIS — L98499 Non-pressure chronic ulcer of skin of other sites with unspecified severity: Secondary | ICD-10-CM | POA: Diagnosis not present

## 2024-01-01 DIAGNOSIS — R569 Unspecified convulsions: Secondary | ICD-10-CM | POA: Diagnosis not present

## 2024-01-01 LAB — CBC
HCT: 37.1 % (ref 36.0–46.0)
HCT: 40.6 % (ref 36.0–46.0)
Hemoglobin: 11.7 g/dL — ABNORMAL LOW (ref 12.0–15.0)
Hemoglobin: 12.7 g/dL (ref 12.0–15.0)
MCH: 31.1 pg (ref 26.0–34.0)
MCH: 30.6 pg (ref 26.0–34.0)
MCHC: 31.5 g/dL (ref 30.0–36.0)
MCHC: 31.3 g/dL (ref 30.0–36.0)
MCV: 98.7 fL (ref 80.0–100.0)
MCV: 97.8 fL (ref 80.0–100.0)
Platelets: 149 10*3/uL — ABNORMAL LOW (ref 150–400)
Platelets: 253 10*3/uL (ref 150–400)
RBC: 3.76 MIL/uL — ABNORMAL LOW (ref 3.87–5.11)
RBC: 4.15 MIL/uL (ref 3.87–5.11)
RDW: 14.4 % (ref 11.5–15.5)
RDW: 14.6 % (ref 11.5–15.5)
WBC: 7.5 10*3/uL (ref 4.0–10.5)
WBC: 9 10*3/uL (ref 4.0–10.5)
nRBC: 0 % (ref 0.0–0.2)
nRBC: 0 % (ref 0.0–0.2)

## 2024-01-01 LAB — BASIC METABOLIC PANEL WITH GFR
Anion gap: 11 (ref 5–15)
BUN: 13 mg/dL (ref 6–20)
CO2: 23 mmol/L (ref 22–32)
Calcium: 8.4 mg/dL — ABNORMAL LOW (ref 8.9–10.3)
Chloride: 105 mmol/L (ref 98–111)
Creatinine, Ser: 0.93 mg/dL (ref 0.44–1.00)
GFR, Estimated: >60 mL/min (ref >60)
Glucose, Bld: 136 mg/dL — ABNORMAL HIGH (ref 70–99)
Potassium: 4 mmol/L (ref 3.5–5.1)
Sodium: 139 mmol/L (ref 135–145)

## 2024-01-01 NOTE — Assessment & Plan Note (Signed)
 APS called.  Environmental services aware. - contact precautions  -Await further instructions by APS

## 2024-01-01 NOTE — Assessment & Plan Note (Addendum)
 Slightly more exudative today but otherwise nontender and not erythematous.  Reconsult to wound care for reassessment, would consider antibiotics treatment if this was the recommendation. -wound care per recommendation

## 2024-01-01 NOTE — Assessment & Plan Note (Addendum)
 Stable.  Patient did fall today secondary to trying to pivot on her left leg which has chronic contracture and weakness.  CBC, BMP and UA done at time of fall were all negative.  Continue to monitor. - Keppra  1,500 mg BID, Vimpat  200 mg BID, Phenytoin  100 mg chewable tablet TID - PT/OT - Lab holiday - Fall precautions - Delirium precautions  - Seizure precautions  -prednisone  10 mg until 5/24 then discontinue

## 2024-01-01 NOTE — Progress Notes (Signed)
 Occupational Therapy Treatment Patient Details Name: Patricia White MRN: [id number] DOB: April 29, 1971 Today's Date: 01/01/2024   History of present illness Pt is a 53 y/o F presenting to ED on 5/15 with breakthrough seziures. 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   OT comments  Patient not showing progress this date with event just preceding OT visit of nursing lowering pt to floor in bathroom with need of Max-Total Assist of 2 (PT/OT) to assist pt from floor to toilet and from toilet to recliner after stranding hygiene with total assist. PT regressing today as evidenced by an decreased score on 6-Clicks AM-PAC measure of occupational Performance with previous score of 14/24, and current score of 10/24.   Unsure if this is due to bathroom event, pt lethargy and somnolence or other. Patient remains limited by below deficits including cognitive deficits, LT hemiplegia, somnolence, dysarthria,  generalized weakness and decreased activity tolerance along with deficits noted below. Pt continues to demonstrate fair to good rehab potential and would benefit from continued skilled OT to increase safety and independence with ADLs and functional transfers to allow pt to return home safely and reduce caregiver burden and fall risk.       If plan is discharge home, recommend the following:  Two people to help with walking and/or transfers;A lot of help with bathing/dressing/bathroom;Assistance with cooking/housework;Direct supervision/assist for medications management;Direct supervision/assist for financial management;Assist for transportation;Supervision due to cognitive status;Help with stairs or ramp for entrance;A lot of help with walking and/or transfers;Two people to help with bathing/dressing/bathroom   Equipment Recommendations       Recommendations for Other Services      Precautions / Restrictions Precautions Precautions: Fall Recall of  Precautions/Restrictions: Impaired Precaution/Restrictions Comments: bowel incontinence, seizure precs, L hemiparesis (congenital CP) Required Braces or Orthoses: Other Brace Other Brace: LLE AFO Restrictions Weight Bearing Restrictions Per Provider Order: No       Mobility Bed Mobility Overal bed mobility: Needs Assistance         Sit to supine: Max assist, +2 for physical assistance, HOB elevated        Transfers                         Balance Overall balance assessment: Needs assistance Sitting-balance support: Feet supported (forearm resting on lap) Sitting balance-Leahy Scale: Poor (on toilet) Sitting balance - Comments: CGA-min As. May be due to lethargy Postural control: Right lateral lean, Left lateral lean Standing balance support: Bilateral upper extremity supported, During functional activity Standing balance-Leahy Scale: Poor                             ADL either performed or assessed with clinical judgement   ADL Overall ADL's : Needs assistance/impaired                         Toilet Transfer: Maximal assistance;Stand-pivot;Cueing for sequencing;Cueing for safety;+2 for physical assistance Toilet Transfer Details (indicate cue type and reason): Pt seen by OT this date due to need, as nursing alerted PT and OT that pt was lowered to floor in bathroom by nursing after a loss of balance. Pt was assessed and denied any pain. Nursing denied any hard strikes with lower to floor. Pt then assisted with Total Assist of 2 people to toilet.  Once pt completed bowel void, pt stood from toilet with Max As  and performed pivot to recliner using stand step with Max As of 2 people. Recliner wheeled to bedside and pt assisted from recliner to EOB with Max As of 1 person. Toileting- Clothing Manipulation and Hygiene: Total assistance;+2 for physical assistance Toileting - Clothing Manipulation Details (indicate cue type and reason): Pt able to  assist pt with standing balance while OT performed Total Assist peri care.  Pt with increased time on toilet with need of CGA to Min As at trunk for safety due to lateral leaning and pt somnolence and falling asleep.     Functional mobility during ADLs: Maximal assistance;Total assistance;+2 for physical assistance;Cueing for sequencing;Cueing for safety      Extremity/Trunk Assessment Upper Extremity Assessment Upper Extremity Assessment: RUE deficits/detail;LUE deficits/detail RUE Deficits / Details: Generalized weakness and ataxia bvs apraxia LUE Deficits / Details: Congential CP, L hemi, painful LUE, noted wound to elbow, pt guards LUE but allows OT to support it.   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 Somnolence. Moments of eyes wide open and eye contact.   Perception     Praxis     Communication Communication Communication: Impaired Factors Affecting Communication: Reduced clarity of speech;Difficulty expressing self   Cognition Arousal: Lethargic Behavior During Therapy: Flat affect Cognition: History of cognitive impairments             OT - Cognition Comments: 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, required encouragement from family for OOB activity, decr attention.                 Following commands: Impaired Following commands impaired: Follows one step commands inconsistently, Follows one step commands with increased time      Cueing   Cueing Techniques: Verbal cues, Gestural cues, Tactile cues, Visual cues  Exercises      Shoulder Instructions       General Comments      Pertinent Vitals/ Pain       Pain Assessment Pain Assessment: PAINAD Breathing: normal Negative Vocalization: none Facial Expression: smiling or inexpressive Body Language:  relaxed Consolability: no need to console PAINAD Score: 0  Home Living                                          Prior Functioning/Environment              Frequency  Min 1X/week        Progress Toward Goals  OT Goals(current goals can now be found in the care plan section)  Progress towards OT goals: Not progressing toward goals - comment  Acute Rehab OT Goals OT Goal Formulation: Patient unable to participate in goal setting Time For Goal Achievement: 12/29/23 Potential to Achieve Goals: Fair  Plan      Co-evaluation    PT/OT/SLP Co-Evaluation/Treatment: Yes Reason for Co-Treatment: To address functional/ADL transfers;For patient/therapist safety PT goals addressed during session: Mobility/safety with mobility;Strengthening/ROM;Balance OT goals addressed during session: ADL's and self-care;Strengthening/ROM      AM-PAC OT "6 Clicks" Daily Activity     Outcome Measure   Help from another person eating meals?: A Lot Help from another person taking care of personal grooming?: A Lot Help from another person toileting, which includes using toliet, bedpan, or urinal?:  Total Help from another person bathing (including washing, rinsing, drying)?: A Lot Help from another person to put on and taking off regular upper body clothing?: A Lot Help from another person to put on and taking off regular lower body clothing?: A Lot 6 Click Score: 11    End of Session Equipment Utilized During Treatment: Gait belt  OT Visit Diagnosis: Unsteadiness on feet (R26.81);History of falling (Z91.81);Muscle weakness (generalized) (M62.81);Hemiplegia and hemiparesis;Other symptoms and signs involving cognitive function;Other symptoms and signs involving the nervous system (R29.898);Cognitive communication deficit (R41.841) Hemiplegia - Right/Left: Left Hemiplegia - dominant/non-dominant: Non-Dominant   Activity Tolerance Patient limited by fatigue;Patient limited by  lethargy   Patient Left in bed;with call bell/phone within reach;with bed alarm set;with nursing/sitter in room;Other (comment) (MD in room)   Nurse Communication Other (comment) (RN accepting of PT/OT assistance)        Time: [phone] OT Time Calculation (min): 23 min  Charges: OT General Charges $OT Visit: 1 Visit OT Treatments $Self Care/Home Management : 8-22 mins  [facility], OT Acute Rehab Services Office: [phone] 01/01/2024   Asher Blade 01/01/2024, 11:22 AM

## 2024-01-01 NOTE — Plan of Care (Signed)
   Problem: Activity: Goal: Risk for activity intolerance will decrease Outcome: Progressing   Problem: Nutrition: Goal: Adequate nutrition will be maintained Outcome: Progressing

## 2024-01-01 NOTE — Progress Notes (Signed)
 Physical Therapy Treatment Patient Details Name: Patricia White MRN: [id number] DOB: May 23, 1971 Today's Date: 01/01/2024   History of Present Illness Pt is a 53 y/o F presenting to ED on 5/15 with breakthrough seziures. 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    PT Comments  Pt received on the floor in bathroom after RN stopped this PT and OT while in the hallway. Pt required +2 Max A for all mobility including floor transfer and stand pivot from toilet to recliner and recliner to bed. No change in DC/DME recs at this time. PT will continue to follow.     If plan is discharge home, recommend the following: Help with stairs or ramp for entrance;A lot of help with walking and/or transfers;Supervision due to cognitive status;Assistance with cooking/housework;Assist for transportation   Can travel by private vehicle     No  Equipment Recommendations  Wheelchair (measurements PT);Wheelchair cushion (measurements PT)    Recommendations for Other Services       Precautions / Restrictions Precautions Precautions: Fall Recall of Precautions/Restrictions: Impaired Precaution/Restrictions Comments: bowel incontinence, seizure precs, L hemiparesis (congenital CP) Required Braces or Orthoses: Other Brace Other Brace: LLE AFO Restrictions Weight Bearing Restrictions Per Provider Order: No     Mobility  Bed Mobility Overal bed mobility: Needs Assistance         Sit to supine: Max assist, +2 for physical assistance, HOB elevated   General bed mobility comments: +2 Max A to pivot from recliner to bed.    Transfers Overall transfer level: Needs assistance Equipment used: 2 person hand held assist Transfers: Sit to/from Stand Sit to Stand: +2 physical assistance, Max assist Stand pivot transfers: +2 physical assistance, Max assist         General transfer comment: +2 max A for floor transfer and stand pivots from toilet to recliner and  recliner to bed.    Ambulation/Gait               General Gait Details: Deferred due to pt fall.   Stairs             Wheelchair Mobility     Tilt Bed    Modified Rankin (Stroke Patients Only)       Balance Overall balance assessment: Needs assistance Sitting-balance support: Feet supported (forearm resting on lap) Sitting balance-Leahy Scale: Poor (on toilet) Sitting balance - Comments: CGA-min As. May be due to lethargy Postural control: Right lateral lean, Left lateral lean Standing balance support: Bilateral upper extremity supported, During functional activity Standing balance-Leahy Scale: Poor                              Communication Communication Communication: Impaired Factors Affecting Communication: Reduced clarity of speech;Difficulty expressing self  Cognition Arousal: Lethargic Behavior During Therapy: Flat affect   PT - Cognitive impairments: History of cognitive impairments, Awareness, Initiation, Sequencing, Problem solving, Safety/Judgement Difficult to assess due to: Impaired communication                     PT - Cognition Comments: History of intellectual disability at baseline. Following commands: Impaired Following commands impaired: Follows one step commands inconsistently, Follows one step commands with increased time    Cueing Cueing Techniques: Verbal cues, Gestural cues, Tactile cues, Visual cues  Exercises      General Comments General comments (skin integrity, edema, etc.): VSS      Pertinent Vitals/Pain  Pain Assessment Pain Assessment: Faces Faces Pain Scale: No hurt Breathing: normal Negative Vocalization: none Facial Expression: smiling or inexpressive Body Language: relaxed Consolability: no need to console PAINAD Score: 0    Home Living                          Prior Function            PT Goals (current goals can now be found in the care plan section) Progress towards  PT goals: Progressing toward goals    Frequency    Min 2X/week      PT Plan      Co-evaluation PT/OT/SLP Co-Evaluation/Treatment: Yes Reason for Co-Treatment: To address functional/ADL transfers;For patient/therapist safety PT goals addressed during session: Mobility/safety with mobility;Strengthening/ROM;Balance OT goals addressed during session: ADL's and self-care;Strengthening/ROM      AM-PAC PT "6 Clicks" Mobility   Outcome Measure  Help needed turning from your back to your side while in a flat bed without using bedrails?: A Lot Help needed moving from lying on your back to sitting on the side of a flat bed without using bedrails?: A Lot Help needed moving to and from a bed to a chair (including a wheelchair)?: A Lot Help needed standing up from a chair using your arms (e.g., wheelchair or bedside chair)?: A Lot Help needed to walk in hospital room?: Total Help needed climbing 3-5 steps with a railing? : Total 6 Click Score: 10    End of Session Equipment Utilized During Treatment: Gait belt Activity Tolerance: Patient tolerated treatment well;Patient limited by lethargy Patient left: in bed;with call bell/phone within reach;with bed alarm set Nurse Communication: Mobility status PT Visit Diagnosis: Other abnormalities of gait and mobility (R26.89);Hemiplegia and hemiparesis;Muscle weakness (generalized) (M62.81) Hemiplegia - Right/Left: Left Hemiplegia - dominant/non-dominant: Non-dominant Hemiplegia - caused by: Unspecified     Time: [id number] PT Time Calculation (min) (ACUTE ONLY): 23 min  Charges:    $Therapeutic Activity: 8-22 mins PT General Charges $$ ACUTE PT VISIT: 1 Visit                     Britain Saber B, PT, DPT Acute Rehab Services [phone]    Patricia White 01/01/2024, 1:41 PM

## 2024-01-01 NOTE — Progress Notes (Signed)
 Patient stated that she needed to go to the bathroom, RN helped pt ambuylate to the bathroom with the walker. When we got to the bathroom when she went to sit down she twisted her hips to the left and fell on her bottom. She did not hit her head. RN called for assistance PT came in and helped pt get up and back in the bed. VS were taken. Charge RN made aware.

## 2024-01-01 NOTE — Progress Notes (Signed)
 Daily Progress Note Intern Pager: [phone]  Patient name: Patricia White Medical record number: [id number] Date of birth: 1970-11-07 Age: 53 y.o. Gender: female  Primary Care Provider: Ivin Marrow, MD Consultants: Neurology (signed off) Code Status: DNR  Pt Overview and Major Events to Date:  5/5: Admitted  Assessment and Plan:  53 year old female patient with a PMH of CP and multiple instances of EEG confirmed status epilepticus presenting with significant breakthrough seizures/status secondary to poor medication compliance.  Medically stable at this time pending SNF approval and placement Assessment & Plan Seizures (HCC) Stable.  Patient did fall today secondary to trying to pivot on her left leg which has chronic contracture and weakness.  CBC, BMP and UA done at time of fall were all negative.  Continue to monitor. - Keppra  1,500 mg BID, Vimpat  200 mg BID, Phenytoin  100 mg chewable tablet TID - PT/OT - Lab holiday - Fall precautions - Delirium precautions  - Seizure precautions  -prednisone  10 mg until 5/24 then discontinue Infestation by bed bug APS called.  Environmental services aware. - contact precautions  -Await further instructions by APS Open wound of left upper arm Slightly more exudative today but otherwise nontender and not erythematous.  Reconsult to wound care for reassessment, would consider antibiotics treatment if this was the recommendation. -wound care per recommendation Sinus tachycardia D-dimer complete and negative.  Tachycardia has improved however still present.  Continue to monitor Facial swelling Stable see below. CT maxillofacial without any evidence of significant acute findings.  Read as similar to prior study done in 2021 where patient had bilateral parotid swelling with no evidence of obstructing stone or other acute process. Chronic health problem HTN: no home medications, monitor clinically  T2DM: last A1c 5.3 on 11/23/2022,  monitor glucose with daily BMP Cerebral palsy: residual left sided weakness  HLD: continue Lipitor 40 mg  GERD: continue Pepcid  20 mg and Protonix  40 mg daily   FEN/GI: Carb modified PPx: Lovenox  Dispo: SNF pending clinical improvement .   Subjective:  Remains at baseline though somewhat more sleepy than usual today.  Patient did fall but was A&O and unharmed following.  Objective: Temp:  [98.1 F (36.7 C)-98.3 F (36.8 C)] 98.3 F (36.8 C) (05/20 0433) Pulse Rate:  [75-110] 75 (05/20 0433) Resp:  [14-18] 14 (05/20 0433) BP: (109-125)/(71-93) 125/93 (05/20 0433) SpO2:  [100 %] 100 % (05/20 0433) Physical Exam: General: Well-appearing, baseline mental status Cardiovascular: RRR, no M/R/G Respiratory: CTAB, no increased work of breathing Abdomen: Flat, soft, nontender Extremities: Plus peripheral pulses in all 4 extremities, no pitting edema noted.  Laboratory: Most recent CBC Lab Results  Component Value Date   WBC 8.6 12/28/2023   HGB 12.1 12/28/2023   HCT 38.4 12/28/2023   MCV 97.2 12/28/2023   PLT 203 12/28/2023   Most recent BMP    Latest Ref Rng & Units 12/30/2023    8:19 AM  BMP  Glucose 70 - 99 mg/dL 660   BUN 6 - 20 mg/dL 13   Creatinine 6.30 - 1.00 mg/dL 1.60   Sodium 109 - 323 mmol/L 136   Potassium 3.5 - 5.1 mmol/L 4.2   Chloride 98 - 111 mmol/L 102   CO2 22 - 32 mmol/L 23   Calcium  8.9 - 10.3 mg/dL 8.7     Rayma Calandra, DO 01/01/2024, 7:51 AM  PGY-1, [facility] FPTS Intern pager: [phone], text pages welcome Secure chat group [facility] Teaching Service

## 2024-01-01 NOTE — Assessment & Plan Note (Addendum)
 Stable see below. CT maxillofacial without any evidence of significant acute findings.  Read as similar to prior study done in 2021 where patient had bilateral parotid swelling with no evidence of obstructing stone or other acute process.

## 2024-01-01 NOTE — Assessment & Plan Note (Addendum)
 D-dimer complete and negative.  Tachycardia has improved however still present.  Continue to monitor

## 2024-01-01 NOTE — Assessment & Plan Note (Signed)
 HTN: no home medications, monitor clinically  T2DM: last A1c 5.3 on 11/23/2022, monitor glucose with daily BMP Cerebral palsy: residual left sided weakness  HLD: continue Lipitor 40 mg  GERD: continue Pepcid  20 mg and Protonix  40 mg daily

## 2024-01-01 NOTE — TOC Progression Note (Addendum)
 Transition of Care [facility]) - Progression Note    Patient Details  Name: Patricia White MRN: [id number] Date of Birth: Nov 07, 1970  [facility] CM/SW Contact  Tandy Fam, [place] Phone Number: 01/01/2024, 10:14 AM  Clinical Narrative:   CSW spoke with patient's aunt [name] to provide bed offers for SNF. [name] to discuss with patient's grandmother and update CSW with SNF choice. CSW to follow.  UPDATE: CSW received update from patient's family they would like to choose [facility]. CSW confirmed bed availability with [facility] and sent request to CMA to initiate insurance authorization. CSW to follow.    Expected Discharge Plan: Skilled Nursing Facility    Expected Discharge Plan and Services                                               Social Determinants of Health (SDOH) Interventions SDOH Screenings   Food Insecurity: Patient Unable To Answer (12/28/2023)  Housing: Patient Unable To Answer (12/28/2023)  Transportation Needs: Patient Unable To Answer (12/28/2023)  Utilities: Patient Unable To Answer (12/28/2023)  Alcohol Screen: Low Risk  (03/24/2023)  Depression (PHQ2-9): Low Risk  (08/09/2023)  Financial Resource Strain: Low Risk  (03/24/2023)  Physical Activity: Insufficiently Active (03/24/2023)  Social Connections: Moderately Isolated (11/19/2023)  Stress: No Stress Concern Present (03/24/2023)  Tobacco Use: Low Risk  (11/19/2023)  Health Literacy: Adequate Health Literacy (03/24/2023)    Readmission Risk Interventions    11/16/2023   11:48 AM  Readmission Risk Prevention Plan  Transportation Screening Complete  PCP or Specialist Appt within 5-7 Days Complete  Home Care Screening Complete  Medication Review (RN CM) Complete

## 2024-01-02 ENCOUNTER — Encounter (HOSPITAL_COMMUNITY): Payer: Self-pay | Admitting: Family Medicine

## 2024-01-02 ENCOUNTER — Other Ambulatory Visit: Payer: Self-pay

## 2024-01-02 DIAGNOSIS — K625 Hemorrhage of anus and rectum: Secondary | ICD-10-CM | POA: Insufficient documentation

## 2024-01-02 DIAGNOSIS — R569 Unspecified convulsions: Secondary | ICD-10-CM | POA: Diagnosis not present

## 2024-01-02 LAB — CBC
HCT: 30.5 % — ABNORMAL LOW (ref 36.0–46.0)
HCT: 33.5 % — ABNORMAL LOW (ref 36.0–46.0)
Hemoglobin: 9.7 g/dL — ABNORMAL LOW (ref 12.0–15.0)
Hemoglobin: 10.7 g/dL — ABNORMAL LOW (ref 12.0–15.0)
MCH: 31.1 pg (ref 26.0–34.0)
MCH: 31.1 pg (ref 26.0–34.0)
MCHC: 31.8 g/dL (ref 30.0–36.0)
MCHC: 31.9 g/dL (ref 30.0–36.0)
MCV: 97.8 fL (ref 80.0–100.0)
MCV: 97.4 fL (ref 80.0–100.0)
Platelets: 211 10*3/uL (ref 150–400)
Platelets: 229 10*3/uL (ref 150–400)
RBC: 3.12 MIL/uL — ABNORMAL LOW (ref 3.87–5.11)
RBC: 3.44 MIL/uL — ABNORMAL LOW (ref 3.87–5.11)
RDW: 14.6 % (ref 11.5–15.5)
RDW: 14.6 % (ref 11.5–15.5)
WBC: 9.6 10*3/uL (ref 4.0–10.5)
WBC: 9.1 10*3/uL (ref 4.0–10.5)
nRBC: 0.2 % (ref 0.0–0.2)
nRBC: 0 % (ref 0.0–0.2)

## 2024-01-02 LAB — AEROBIC CULTURE W GRAM STAIN (SUPERFICIAL SPECIMEN): Gram Stain: NONE SEEN

## 2024-01-02 LAB — CULTURE, BLOOD (ROUTINE X 2): Culture: NO GROWTH

## 2024-01-02 MED ORDER — PANTOPRAZOLE SODIUM 40 MG PO TBEC
40.0000 mg | DELAYED_RELEASE_TABLET | Freq: Two times a day (BID) | ORAL | Status: DC
  Administered 2024-01-02 – 2024-01-03 (×2): 40 mg via ORAL
  Filled 2024-01-02 (×2): qty 1

## 2024-01-02 MED ORDER — HYDROCORTISONE 1 % EX CREA
TOPICAL_CREAM | Freq: Three times a day (TID) | CUTANEOUS | Status: DC
  Filled 2024-01-02: qty 28

## 2024-01-02 MED ORDER — POLYETHYLENE GLYCOL 3350 17 G PO PACK: 17.0000 g | PACK | Freq: Every day | ORAL | Status: DC

## 2024-01-02 NOTE — Assessment & Plan Note (Signed)
 Stable.  Continue treatment as below. - Keppra  1,500 mg BID, Vimpat  200 mg BID, Phenytoin  100 mg chewable tablet TID - PT/OT - Lab holiday - Fall precautions - Delirium precautions  - Seizure precautions  -prednisone  10 mg until 5/24 then discontinue

## 2024-01-02 NOTE — Progress Notes (Addendum)
 Daily Progress Note Intern Pager: [phone]  Patient name: Patricia White Medical record number: [id number] Date of birth: 16-Feb-1971 Age: 53 y.o. Gender: female  Primary Care Provider: Ivin Marrow, MD Consultants: neurology (signed off) Code Status: dnr  Pt Overview and Major Events to Date:  5/5: Admitted  Assessment and Plan:  This is a 53 year old female patient with PMH of CP and multiple instances of status epilepticus presenting with significant breakthrough seizures in the setting of poor medication compliance.  She is medically stable at this time pending SNF approval and placement. Assessment & Plan Seizures (HCC) Stable.  Continue treatment as below. - Keppra  1,500 mg BID, Vimpat  200 mg BID, Phenytoin  100 mg chewable tablet TID - PT/OT - Lab holiday - Fall precautions - Delirium precautions  - Seizure precautions  -prednisone  10 mg until 5/24 then discontinue Infestation by bed bug APS called.  Environmental services aware. - contact precautions  -Await further instructions by APS Open wound of left upper arm Slightly more exudative today but otherwise nontender and not erythematous.  Reconsult to wound care for reassessment, would consider antibiotics treatment if this was the recommendation. -wound care per recommendation Sinus tachycardia Continues to be mild and stable will monitor. Facial swelling Stable see below. CT maxillofacial without any evidence of significant acute findings.  Read as similar to prior study done in 2021 where patient had bilateral parotid swelling with no evidence of obstructing stone or other acute process. Blood per rectum Patient with instance of hard stool with bright red blood on stool.  CBC with significant drop. Sensitive exam performed today without evidence of further bleeding. Unable to appropriately position patient for full exam due to condition of patient and bedding. See description below.   -Repeat CBC, monitor  for further drop. -No evidence of bruising on exam - added miralax  to soften stools Chronic health problem HTN: no home medications, monitor clinically  T2DM: last A1c 5.3 on 11/23/2022, monitor glucose with daily BMP Cerebral palsy: residual left sided weakness  HLD: continue Lipitor 40 mg  GERD: continue Pepcid  20 mg and Protonix  40 mg daily   FEN/GI: Carb modified PPx: Lovenox  Dispo:SNF bed offer pending.   Subjective:  Shauniece does not have any complaints this morning, however on exam it was noted that she was sitting in her own urine and that her PureWick was not connected to the wall and was coiled up underneath her.  Nursing was informed of the issue to better coordinate care of the patient.   Objective: Temp:  [97.8 F (36.6 C)-98.6 F (37 C)] 98.6 F (37 C) (05/21 0449) Pulse Rate:  [92-111] 98 (05/21 0449) Resp:  [14-16] 14 (05/21 0037) BP: (101-138)/(66-108) 101/66 (05/21 0449) SpO2:  [98 %-100 %] 98 % (05/21 0449) Physical Exam: General: Well-appearing, no distress Cardiovascular: RRR, no M/R/G Respiratory: CTAB, no increased work of breathing Abdomen: Flat, soft, nontender CZ:[id number] by Deland Favre, MS-4, while I supervised. No evidence of bruising related to fall. Unable to visualize anus due to patient positioning and reasons listed above.  Extremities: Previously noted left arm wound with no significant changes.  Nonerythematous, possible undermining of wound edge at 5 to 6 o'clock position.  Laboratory: Most recent CBC Lab Results  Component Value Date   WBC 9.6 01/02/2024   HGB 9.7 (L) 01/02/2024   HCT 30.5 (L) 01/02/2024   MCV 97.8 01/02/2024   PLT 211 01/02/2024   Most recent BMP    Latest Ref Rng & Units  01/01/2024    9:45 AM  BMP  Glucose 70 - 99 mg/dL 409   BUN 6 - 20 mg/dL 13   Creatinine 8.11 - 1.00 mg/dL 9.14   Sodium 782 - 956 mmol/L 139   Potassium 3.5 - 5.1 mmol/L 4.0   Chloride 98 - 111 mmol/L 105   CO2 22 - 32 mmol/L 23   Calcium   8.9 - 10.3 mg/dL 8.4     Rayma Calandra, DO 01/02/2024, 7:27 AM  PGY-1, [facility] FPTS Intern pager: [phone], text pages welcome Secure chat group [facility] Teaching Service

## 2024-01-02 NOTE — Assessment & Plan Note (Signed)
 HTN: no home medications, monitor clinically  T2DM: last A1c 5.3 on 11/23/2022, monitor glucose with daily BMP Cerebral palsy: residual left sided weakness  HLD: continue Lipitor 40 mg  GERD: continue Pepcid  20 mg and Protonix  40 mg daily

## 2024-01-02 NOTE — Assessment & Plan Note (Addendum)
 Patient with instance of hard stool with bright red blood on stool.  CBC with significant drop. Sensitive exam performed today without evidence of further bleeding. Unable to appropriately position patient for full exam due to condition of patient and bedding. See description below.   -Repeat CBC, monitor for further drop. -No evidence of bruising on exam - added miralax  to soften stools

## 2024-01-02 NOTE — Assessment & Plan Note (Signed)
 Continues to be mild and stable will monitor.

## 2024-01-02 NOTE — Assessment & Plan Note (Signed)
 Stable see below. CT maxillofacial without any evidence of significant acute findings.  Read as similar to prior study done in 2021 where patient had bilateral parotid swelling with no evidence of obstructing stone or other acute process.

## 2024-01-02 NOTE — Assessment & Plan Note (Signed)
 APS called.  Environmental services aware. - contact precautions  -Await further instructions by APS

## 2024-01-02 NOTE — Assessment & Plan Note (Signed)
 Slightly more exudative today but otherwise nontender and not erythematous.  Reconsult to wound care for reassessment, would consider antibiotics treatment if this was the recommendation. -wound care per recommendation

## 2024-01-02 NOTE — TOC Progression Note (Signed)
 Transition of Care [facility]) - Progression Note    Patient Details  Name: Patricia White MRN: [id number] Date of Birth: 14-Sep-1970  Transition of Care [facility]) CM/SW Contact  Tandy Fam, [place] Phone Number: 01/02/2024, 4:05 PM  Clinical Narrative:   CSW received insurance approval for patient to admit to SNF, but it is too late for SNF to accept today. Patient can admit tomorrow. CSW to follow.    Expected Discharge Plan: Skilled Nursing Facility    Expected Discharge Plan and Services                                               Social Determinants of Health (SDOH) Interventions SDOH Screenings   Food Insecurity: Patient Unable To Answer (12/28/2023)  Housing: Low Risk  (01/02/2024)  Transportation Needs: Patient Unable To Answer (12/28/2023)  Utilities: Patient Unable To Answer (12/28/2023)  Alcohol Screen: Low Risk  (03/24/2023)  Depression (PHQ2-9): Low Risk  (08/09/2023)  Financial Resource Strain: Low Risk  (03/24/2023)  Physical Activity: Insufficiently Active (03/24/2023)  Social Connections: Moderately Isolated (11/19/2023)  Stress: No Stress Concern Present (03/24/2023)  Tobacco Use: Low Risk  (01/02/2024)  Health Literacy: Adequate Health Literacy (03/24/2023)    Readmission Risk Interventions    11/16/2023   11:48 AM  Readmission Risk Prevention Plan  Transportation Screening Complete  PCP or Specialist Appt within 5-7 Days Complete  Home Care Screening Complete  Medication Review (RN CM) Complete

## 2024-01-03 DIAGNOSIS — B889 Infestation, unspecified: Secondary | ICD-10-CM | POA: Diagnosis not present

## 2024-01-03 DIAGNOSIS — R253 Fasciculation: Secondary | ICD-10-CM | POA: Diagnosis not present

## 2024-01-03 DIAGNOSIS — E785 Hyperlipidemia, unspecified: Secondary | ICD-10-CM | POA: Diagnosis not present

## 2024-01-03 DIAGNOSIS — Z91199 Patient's noncompliance with other medical treatment and regimen due to unspecified reason: Secondary | ICD-10-CM | POA: Diagnosis not present

## 2024-01-03 DIAGNOSIS — J302 Other seasonal allergic rhinitis: Secondary | ICD-10-CM | POA: Diagnosis not present

## 2024-01-03 DIAGNOSIS — L853 Xerosis cutis: Secondary | ICD-10-CM | POA: Diagnosis not present

## 2024-01-03 DIAGNOSIS — G809 Cerebral palsy, unspecified: Secondary | ICD-10-CM | POA: Diagnosis not present

## 2024-01-03 DIAGNOSIS — B888 Other specified infestations: Secondary | ICD-10-CM | POA: Diagnosis not present

## 2024-01-03 DIAGNOSIS — Z7409 Other reduced mobility: Secondary | ICD-10-CM | POA: Diagnosis not present

## 2024-01-03 DIAGNOSIS — R000 Tachycardia, unspecified: Secondary | ICD-10-CM | POA: Diagnosis not present

## 2024-01-03 DIAGNOSIS — R1310 Dysphagia, unspecified: Secondary | ICD-10-CM | POA: Diagnosis not present

## 2024-01-03 DIAGNOSIS — R569 Unspecified convulsions: Secondary | ICD-10-CM | POA: Diagnosis not present

## 2024-01-03 DIAGNOSIS — T148XXA Other injury of unspecified body region, initial encounter: Secondary | ICD-10-CM | POA: Diagnosis not present

## 2024-01-03 DIAGNOSIS — G8114 Spastic hemiplegia affecting left nondominant side: Secondary | ICD-10-CM | POA: Diagnosis not present

## 2024-01-03 DIAGNOSIS — I10 Essential (primary) hypertension: Secondary | ICD-10-CM | POA: Diagnosis not present

## 2024-01-03 DIAGNOSIS — W19XXXA Unspecified fall, initial encounter: Secondary | ICD-10-CM | POA: Diagnosis not present

## 2024-01-03 DIAGNOSIS — L89023 Pressure ulcer of left elbow, stage 3: Secondary | ICD-10-CM | POA: Diagnosis not present

## 2024-01-03 DIAGNOSIS — M6281 Muscle weakness (generalized): Secondary | ICD-10-CM | POA: Diagnosis not present

## 2024-01-03 DIAGNOSIS — R1311 Dysphagia, oral phase: Secondary | ICD-10-CM | POA: Diagnosis not present

## 2024-01-03 DIAGNOSIS — E119 Type 2 diabetes mellitus without complications: Secondary | ICD-10-CM | POA: Diagnosis not present

## 2024-01-03 DIAGNOSIS — E43 Unspecified severe protein-calorie malnutrition: Secondary | ICD-10-CM | POA: Diagnosis not present

## 2024-01-03 DIAGNOSIS — Z741 Need for assistance with personal care: Secondary | ICD-10-CM | POA: Diagnosis not present

## 2024-01-03 DIAGNOSIS — E860 Dehydration: Secondary | ICD-10-CM | POA: Diagnosis not present

## 2024-01-03 DIAGNOSIS — L89893 Pressure ulcer of other site, stage 3: Secondary | ICD-10-CM | POA: Diagnosis not present

## 2024-01-03 DIAGNOSIS — D61818 Other pancytopenia: Secondary | ICD-10-CM | POA: Diagnosis not present

## 2024-01-03 DIAGNOSIS — R627 Adult failure to thrive: Secondary | ICD-10-CM | POA: Diagnosis not present

## 2024-01-03 DIAGNOSIS — K219 Gastro-esophageal reflux disease without esophagitis: Secondary | ICD-10-CM | POA: Diagnosis not present

## 2024-01-03 DIAGNOSIS — L089 Local infection of the skin and subcutaneous tissue, unspecified: Secondary | ICD-10-CM | POA: Diagnosis not present

## 2024-01-03 DIAGNOSIS — R404 Transient alteration of awareness: Secondary | ICD-10-CM | POA: Diagnosis not present

## 2024-01-03 DIAGNOSIS — K625 Hemorrhage of anus and rectum: Secondary | ICD-10-CM | POA: Diagnosis not present

## 2024-01-03 DIAGNOSIS — Z7401 Bed confinement status: Secondary | ICD-10-CM | POA: Diagnosis not present

## 2024-01-03 DIAGNOSIS — Z743 Need for continuous supervision: Secondary | ICD-10-CM | POA: Diagnosis not present

## 2024-01-03 DIAGNOSIS — R531 Weakness: Secondary | ICD-10-CM | POA: Diagnosis not present

## 2024-01-03 LAB — CBC
HCT: 35.8 % — ABNORMAL LOW (ref 36.0–46.0)
Hemoglobin: 10.8 g/dL — ABNORMAL LOW (ref 12.0–15.0)
MCH: 30.9 pg (ref 26.0–34.0)
MCHC: 30.2 g/dL (ref 30.0–36.0)
MCV: 102.3 fL — ABNORMAL HIGH (ref 80.0–100.0)
Platelets: 226 10*3/uL (ref 150–400)
RBC: 3.5 MIL/uL — ABNORMAL LOW (ref 3.87–5.11)
RDW: 14.6 % (ref 11.5–15.5)
WBC: 7.2 10*3/uL (ref 4.0–10.5)
nRBC: 0 % (ref 0.0–0.2)

## 2024-01-03 LAB — PHENYTOIN LEVEL, TOTAL: Phenytoin Lvl: 9.1 ug/mL — ABNORMAL LOW (ref 10.0–20.0)

## 2024-01-03 MED ORDER — PREDNISONE 10 MG PO TABS: 10.0000 mg | ORAL_TABLET | Freq: Every day | ORAL | Status: AC

## 2024-01-03 MED ORDER — PHENYTOIN 50 MG PO CHEW: 100.0000 mg | CHEWABLE_TABLET | Freq: Two times a day (BID) | ORAL | Status: DC

## 2024-01-03 MED ORDER — POLYETHYLENE GLYCOL 3350 17 G PO PACK: 17.0000 g | PACK | Freq: Every day | ORAL | Status: DC | PRN

## 2024-01-03 NOTE — Plan of Care (Signed)
 Called [facility] and updated her that pt was stable for discharge and going to SNF today. No further concerns at this time from family.

## 2024-01-03 NOTE — Discharge Summary (Addendum)
 Family Medicine Teaching [facility] Discharge Summary  Patient name: Patricia White Medical record number: [id number] Date of birth: 22-Oct-1970 Age: 53 y.o. Gender: female Date of Admission: 12/27/2023  Date of Discharge: 01/03/2024 Admitting Physician: Demetra Filter McDiarmid, MD  Primary Care Provider: Ivin Marrow, MD Consultants: Neurology  Indication for Hospitalization: Breakthrough seizures  Discharge Diagnoses/Problem List:  Principal Problem for Admission: Breakthrough seizure activity secondary to medication noncompliance Other Problems addressed during stay:  Principal Problem:   Seizure-like activity (HCC) Active Problems:   Seizures (HCC)   Chronic health problem   Infestation by bed bug   Intellectual disability   Cerebral palsy (HCC)   Open wound of left upper arm   Sinus tachycardia   Pressure injury of skin of left elbow   Facial swelling   Ulcer of upper extremity (HCC)   Blood per rectum    Brief Hospital Course:  Patricia White is a 53 y.o.female with a history of cerebral palsy, seizure disorder, GERD, HTN, HLD who was admitted to the [facility] Medicine Teaching Service at [facility] for status epilepticus/breakthrough seizures.   Her hospital course is detailed below:  Status epilepticus/breakthrough seizures Patient presented to the ED from home following multiple seizures at home, as well as with EMS en route.  In the ED she was found to be tachycardic; loaded with Keppra  and Vimpat , given Versed .  Neurology was consulted and recommended long-term EEG and head CT; imaging showed no acute intracranial processes, and EEG was consistent with her baseline from previous studies.  Infectious workup in the ED was negative (UA, WBC, CXR). Her Dilantin  levels were extremely low on admission, and her family was unable to confirm which medications she took daily. Neurology felt her seizure activity was due to poor medication compliance. Patient remained seizure free  through out her stay, and was discharged to SNF.  Patient had a prior APS report against her family for similar incident with medication non-compliance. Social work made aware.   Bilateral Parotid Swelling Patient asymptomatic, noted on exam. CT completed with no change from prior exam in 2021.  Bedbug Infestation Noted on admission. Environmental services were made aware, contact precautions were in place through out her stay. She did not require topical steroids during her stay.   Open wound of left upper arm Noted during hospitalization.  Evaluated by wound care nurse who recommended manuka honey dressing.  Remained stable throughout hospitalization.  Blood per rectum Patient with 1 instance of bright red blood on stool with semihard consistency.  Patient had no further instances of blood per rectum.  See recommendations below.  Other chronic conditions were medically managed with home medications and formulary alternatives as necessary (GERD, HTN, HLD)   PCP Follow-up Recommendations: Recommend wound care follow up Needs colonoscopy outpatient   Disposition: SNF  Discharge Condition: Stable  Discharge Exam:  Vitals:   01/03/24 0850 01/03/24 1255  BP: 105/75 (!) 104/53  Pulse: 85 (!) 108  Resp: 14 14  Temp: 98.5 F (36.9 C) 98.9 F (37.2 C)  SpO2: 100% 100%   General: Well-appearing, no distress Cardiac: RRR, no M/R/G Respiratory: CTAB, no increased work of breathing Abdomen: Soft, nontender, no masses or guarding Extremities: No evidence of peripheral edema  Significant Procedures: None  Significant Labs and Imaging:  Recent Labs  Lab 01/02/24 0537 01/02/24 0817 01/03/24 1124  WBC 9.6 9.1 7.2  HGB 9.7* 10.7* 10.8*  HCT 30.5* 33.5* 35.8*  PLT 211 229 226   No results for  input(s): "NA", "K", "CL", "CO2", "GLUCOSE", "BUN", "CREATININE", "CALCIUM ", "MG", "PHOS", "ALKPHOS", "AST", "ALT", "ALBUMIN", "PROTEIN" in the last 48 hours.  CT head without  contrast IMPRESSION: 1. Normal CT examination of the brain.  CT maxillofacial with contrast IMPRESSION: 1. Difficult to exclude mild symmetric Parotitis, although similar to a 2021 CT. No sialolithiasis or evidence of gland obstruction. 2. Mild motion artifact. No other acute or inflammatory process identified in the Face. 3. Poor dentition.    Results/Tests Pending at Time of Discharge: None  Discharge Medications:  Allergies as of 01/03/2024   No Known Allergies      Medication List     STOP taking these medications    (feeding supplement) PROSource Plus liquid   sucralfate  1 GM/10ML suspension Commonly known as: CARAFATE        TAKE these medications    atorvastatin  40 MG tablet Commonly known as: LIPITOR TAKE 1 TABLET BY MOUTH DAILY What changed: when to take this   clonazePAM  0.5 MG tablet Commonly known as: KLONOPIN  Take 1 tablet (0.5 mg total) by mouth 3 (three) times daily.   famotidine  20 MG tablet Commonly known as: PEPCID  Take 1 tablet (20 mg total) by mouth 2 (two) times daily for 14 days. What changed: when to take this   lacosamide  200 MG Tabs tablet Commonly known as: VIMPAT  Take 1 tablet (200 mg total) by mouth 2 (two) times daily.   levETIRAcetam  750 MG tablet Commonly known as: KEPPRA  Take 2 tablets (1,500 mg total) by mouth 2 (two) times daily.   pantoprazole  40 MG tablet Commonly known as: PROTONIX  Take 1 tablet (40 mg total) by mouth daily.   Perampanel  4 MG Tabs Take 1 tablet (4 mg total) by mouth daily.   phenytoin  50 MG tablet Commonly known as: DILANTIN  Chew 2 tablets (100 mg total) by mouth 2 (two) times daily. What changed: when to take this   predniSONE  10 MG tablet Commonly known as: DELTASONE  Take 1 tablet (10 mg total) by mouth daily with breakfast for 2 days. Start taking on: Jan 04, 2024 What changed:  medication strength See the new instructions.        Discharge Instructions: Please refer to Patient  Instructions section of EMR for full details.  Patient was counseled important signs and symptoms that should prompt return to medical care, changes in medications, dietary instructions, activity restrictions, and follow up appointments.   Follow-Up Appointments:  Contact information for after-discharge care     Destination     [facility] Preferred SNF .   Service: Skilled Nursing Contact information: [address] [place]  [phone]                   Please make an appointment with your PCP when able. Patient will need follow-up with neurology outpatient.  Albin Huh, MD 01/03/2024, 2:07 PM PGY-1, [facility]

## 2024-01-03 NOTE — Plan of Care (Signed)

## 2024-01-03 NOTE — Assessment & Plan Note (Addendum)
 Stable. -wound care per recommendation

## 2024-01-03 NOTE — Assessment & Plan Note (Signed)
 More time in normal sinus versus sinus tach.  Continue to monitor

## 2024-01-03 NOTE — Assessment & Plan Note (Signed)
 HTN: no home medications, monitor clinically  T2DM: last A1c 5.3 on 11/23/2022, monitor glucose with daily BMP Cerebral palsy: residual left sided weakness  HLD: continue Lipitor 40 mg  GERD: continue Pepcid  20 mg and Protonix  40 mg daily

## 2024-01-03 NOTE — Progress Notes (Signed)
     Daily Progress Note Intern Pager: [phone]  Patient name: Patricia White Medical record number: [id number] Date of birth: [birth year] Age: 53 y.o. Gender: female  Primary Care Provider: Ivin Marrow, MD Consultants: Neurology (signed off) Code Status: DNR  Pt Overview and Major Events to Date:  5/5: Admitted  Assessment and Plan:  This is a 53 year old female patient with past medical history of CP and multiple instances of status epilepticus confirmed on EEG presenting with significant breakthrough seizures in the setting of poor medication compliance.  She is medically stable at this time pending SNF approval and placement. Assessment & Plan Seizures (HCC) Phenytoin  level slightly low on recheck yesterday.  Otherwise stable - Keppra  1,500 mg BID, Vimpat  200 mg BID, Phenytoin  100 mg chewable tablet TID - PT/OT - Lab holiday - Fall precautions - Delirium precautions  - Seizure precautions  -prednisone  10 mg until 5/24 then discontinue Infestation by bed bug APS called.  Environmental services aware. - contact precautions  -Await further instructions by APS Open wound of left upper arm Stable. -wound care per recommendation Sinus tachycardia More time in normal sinus versus sinus tach.  Continue to monitor Facial swelling Stable see below. CT maxillofacial without any evidence of significant acute findings.  Read as similar to prior study done in 2021 where patient had bilateral parotid swelling with no evidence of obstructing stone or other acute process. Blood per rectum 2 soft nonbloody bowel movements measured yesterday.  Nursing held MiraLAX  due to quality of stool. -Repeat CBC, monitor for further drop. -No evidence of bruising on exam - added miralax  to soften stools, changed to as needed Chronic health problem HTN: no home medications, monitor clinically  T2DM: last A1c 5.3 on 11/23/2022, monitor glucose with daily BMP Cerebral palsy: residual left sided  weakness  HLD: continue Lipitor 40 mg  GERD: continue Pepcid  20 mg and Protonix  40 mg daily    FEN/GI: Carb modified PPx: Lovenox  Dispo:SNF pending placement.   Subjective:  Doing well this morning, no distress.  Requesting assistance with ordering her dinner.  Objective: Temp:  [97.8 F (36.6 C)-99.5 F (37.5 C)] 97.8 F (36.6 C) (05/22 0347) Pulse Rate:  [86-122] 86 (05/22 0347) Resp:  [14-16] 14 (05/22 0347) BP: (94-121)/(60-93) 97/73 (05/22 0347) SpO2:  [98 %-100 %] 100 % (05/22 0347) Physical Exam: General: Well-appearing, no distress Cardiovascular: RRR, no M/R/G Respiratory: CTAB, no increased work of breathing Abdomen: Flat, soft, nontender Extremities: Plus pulses in the bilateral lower extremities, no evidence of peripheral edema  Laboratory: Most recent CBC Lab Results  Component Value Date   WBC 9.1 01/02/2024   HGB 10.7 (L) 01/02/2024   HCT 33.5 (L) 01/02/2024   MCV 97.4 01/02/2024   PLT 229 01/02/2024   Most recent BMP    Latest Ref Rng & Units 01/01/2024    9:45 AM  BMP  Glucose 70 - 99 mg/dL 295   BUN 6 - 20 mg/dL 13   Creatinine 2.84 - 1.00 mg/dL 1.32   Sodium 440 - 102 mmol/L 139   Potassium 3.5 - 5.1 mmol/L 4.0   Chloride 98 - 111 mmol/L 105   CO2 22 - 32 mmol/L 23   Calcium  8.9 - 10.3 mg/dL 8.4     Rayma Calandra, DO 01/03/2024, 7:51 AM  PGY-1, [place] FPTS Intern pager: [phone], text pages welcome Secure chat group [facility] Teaching Service

## 2024-01-03 NOTE — Assessment & Plan Note (Signed)
 Phenytoin  level slightly low on recheck yesterday.  Otherwise stable - Keppra  1,500 mg BID, Vimpat  200 mg BID, Phenytoin  100 mg chewable tablet TID - PT/OT - Lab holiday - Fall precautions - Delirium precautions  - Seizure precautions  -prednisone  10 mg until 5/24 then discontinue

## 2024-01-03 NOTE — Discharge Instructions (Signed)
 Dear Patricia White,   Thank you for letting us  participate in your care! In this section, you will find a brief hospital admission summary of why you were admitted to the hospital, what happened during your admission, your diagnosis/diagnoses, and recommended follow up.  Primary diagnosis: Breakthrough Seizures Treatment plan: Continue to take all seizure medications as previously directed, follow-up with neurologist as needed   POST-HOSPITAL & CARE INSTRUCTIONS We recommend following up with your PCP within 1 week from being discharged from the hospital. Please let PCP/Specialists know of any changes in medications that were made which you will be able to see in the medications section of this packet. Please also follow up with wound care outpatient for your left upper arm wound.  DOCTOR'S APPOINTMENTS & FOLLOW UP Future Appointments  Date Time Provider Department Center  01/21/2024 10:00 AM Penumalli, Brenton [place], MD [facility] None  03/24/2024  1:40 PM [facility] ANNUAL WELLNESS VISIT [facility]     Thank you for choosing [facility]! Take care and be well!  Family Medicine Teaching Service Inpatient Team [place]  [facility]  [address] [place], [place] [address] [phone]

## 2024-01-03 NOTE — Care Management Important Message (Signed)
 Important Message  Patient Details  Name: Patricia White MRN: [id number] Date of Birth: [birth year]   Important Message Given:  Yes - Medicare IM   Patient left prior to IM delivery will mail a copy to the patient home address  Patricia White 01/03/2024, 3:27 PM

## 2024-01-03 NOTE — Assessment & Plan Note (Signed)
 Stable see below. CT maxillofacial without any evidence of significant acute findings.  Read as similar to prior study done in 2021 where patient had bilateral parotid swelling with no evidence of obstructing stone or other acute process.

## 2024-01-03 NOTE — TOC Transition Note (Signed)
 Transition of Care [facility]) - Discharge Note   Patient Details  Name: Patricia White MRN: [id number] Date of Birth: 09/05/1970  Transition of Care [facility]) CM/SW Contact:  Tandy Fam, LCSW Phone Number: 01/03/2024, 1:46 PM   Clinical Narrative:   CSW confirmed with MD that patient is stable for discharge today. CSW confirmed with [facility] that bed is available for patient today. CSW sent discharge to [facility], confirmed ready for patient to admit. CSW left a message for patient's aunt, [name], about transition to [facility] today. Transport arranged with [facility] for next available.  Nurse to call report to [phone], Room 125a.    Final next level of care: Skilled Nursing Facility Barriers to Discharge: Barriers Resolved   Patient Goals and CMS Choice            Discharge Placement              Patient chooses bed at: [facility] Patient to be transferred to facility by: [facility] Name of family member notified: [name] Patient and family notified of of transfer: 01/03/24  Discharge Plan and Services Additional resources added to the After Visit Summary for                                       Social Drivers of Health (SDOH) Interventions SDOH Screenings   Food Insecurity: Patient Unable To Answer (12/28/2023)  Housing: Low Risk  (01/02/2024)  Transportation Needs: Patient Unable To Answer (12/28/2023)  Utilities: Patient Unable To Answer (12/28/2023)  Alcohol Screen: Low Risk  (03/24/2023)  Depression (PHQ2-9): Low Risk  (08/09/2023)  Financial Resource Strain: Low Risk  (03/24/2023)  Physical Activity: Insufficiently Active (03/24/2023)  Social Connections: Moderately Isolated (11/19/2023)  Stress: No Stress Concern Present (03/24/2023)  Tobacco Use: Low Risk  (01/02/2024)  Health Literacy: Adequate Health Literacy (03/24/2023)     Readmission Risk Interventions    11/16/2023   11:48 AM  Readmission Risk  Prevention Plan  Transportation Screening Complete  PCP or Specialist Appt within 5-7 Days Complete  Home Care Screening Complete  Medication Review (RN CM) Complete

## 2024-01-03 NOTE — Assessment & Plan Note (Signed)
 APS called.  Environmental services aware. - contact precautions  -Await further instructions by APS

## 2024-01-03 NOTE — Assessment & Plan Note (Signed)
 2 soft nonbloody bowel movements measured yesterday.  Nursing held MiraLAX  due to quality of stool. -Repeat CBC, monitor for further drop. -No evidence of bruising on exam - added miralax  to soften stools, changed to as needed

## 2024-01-04 ENCOUNTER — Telehealth: Payer: Self-pay

## 2024-01-04 DIAGNOSIS — L853 Xerosis cutis: Secondary | ICD-10-CM | POA: Diagnosis not present

## 2024-01-04 DIAGNOSIS — E785 Hyperlipidemia, unspecified: Secondary | ICD-10-CM | POA: Diagnosis not present

## 2024-01-04 DIAGNOSIS — B889 Infestation, unspecified: Secondary | ICD-10-CM | POA: Diagnosis not present

## 2024-01-04 DIAGNOSIS — R569 Unspecified convulsions: Secondary | ICD-10-CM | POA: Diagnosis not present

## 2024-01-04 DIAGNOSIS — K625 Hemorrhage of anus and rectum: Secondary | ICD-10-CM | POA: Diagnosis not present

## 2024-01-04 NOTE — Telephone Encounter (Signed)
 Leonel Ram from DSS called regarding the patient, they have a active case with her and she did not know where she DC yesterday, told her the patient went to [facility].

## 2024-01-06 DIAGNOSIS — L853 Xerosis cutis: Secondary | ICD-10-CM | POA: Diagnosis not present

## 2024-01-06 DIAGNOSIS — R569 Unspecified convulsions: Secondary | ICD-10-CM | POA: Diagnosis not present

## 2024-01-06 DIAGNOSIS — R000 Tachycardia, unspecified: Secondary | ICD-10-CM | POA: Diagnosis not present

## 2024-01-06 DIAGNOSIS — R531 Weakness: Secondary | ICD-10-CM | POA: Diagnosis not present

## 2024-01-06 DIAGNOSIS — R253 Fasciculation: Secondary | ICD-10-CM | POA: Diagnosis not present

## 2024-01-08 NOTE — Progress Notes (Signed)
 [facility] Liaison Note  01/08/2024  Patricia White March 04, 1971 [id number]  Location: RN Hospital Liaison screened the patient remotely at [facility].  Insurance: [employer]   Patricia White is a 53 y.o. female who is a Primary Care Patient of Ivin Marrow, MD [facility]). The patient was screened for  readmission hospitalization with noted high risk score for unplanned readmission risk with 4 IP in 6 months.  The patient was assessed for potential Care Management service needs for post hospital transition for care coordination. Review of patient's electronic medical record reveals patient is was admitted for Seizures. Pt transitioned to SNF level of care [employer]) for STR. Liaison will collaborate with [facility] PAC-RN to follow up accordingly for community needs if appropriate.   [facility] Care Management/Population Health does not replace or interfere with any arrangements made by the Inpatient Transition of Care team.   For questions contact:   Lilla Reichert, RN, BSN Hospital Liaison [facility]   [facility], Population Health Office Hours MTWF  8:00 am-6:00 pm Direct Dial: [phone] mobile [email or web address]

## 2024-01-09 DIAGNOSIS — T148XXA Other injury of unspecified body region, initial encounter: Secondary | ICD-10-CM | POA: Diagnosis not present

## 2024-01-09 DIAGNOSIS — I10 Essential (primary) hypertension: Secondary | ICD-10-CM | POA: Diagnosis not present

## 2024-01-09 DIAGNOSIS — L089 Local infection of the skin and subcutaneous tissue, unspecified: Secondary | ICD-10-CM | POA: Diagnosis not present

## 2024-01-10 DIAGNOSIS — R531 Weakness: Secondary | ICD-10-CM | POA: Diagnosis not present

## 2024-01-10 DIAGNOSIS — M6281 Muscle weakness (generalized): Secondary | ICD-10-CM | POA: Diagnosis not present

## 2024-01-10 DIAGNOSIS — E119 Type 2 diabetes mellitus without complications: Secondary | ICD-10-CM | POA: Diagnosis not present

## 2024-01-10 DIAGNOSIS — R1310 Dysphagia, unspecified: Secondary | ICD-10-CM | POA: Diagnosis not present

## 2024-01-10 DIAGNOSIS — W19XXXA Unspecified fall, initial encounter: Secondary | ICD-10-CM | POA: Diagnosis not present

## 2024-01-10 DIAGNOSIS — L89893 Pressure ulcer of other site, stage 3: Secondary | ICD-10-CM | POA: Diagnosis not present

## 2024-01-10 DIAGNOSIS — G809 Cerebral palsy, unspecified: Secondary | ICD-10-CM | POA: Diagnosis not present

## 2024-01-14 DIAGNOSIS — L89023 Pressure ulcer of left elbow, stage 3: Secondary | ICD-10-CM | POA: Diagnosis not present

## 2024-01-14 DIAGNOSIS — R253 Fasciculation: Secondary | ICD-10-CM | POA: Diagnosis not present

## 2024-01-14 DIAGNOSIS — M6281 Muscle weakness (generalized): Secondary | ICD-10-CM | POA: Diagnosis not present

## 2024-01-15 DIAGNOSIS — M6281 Muscle weakness (generalized): Secondary | ICD-10-CM | POA: Diagnosis not present

## 2024-01-15 DIAGNOSIS — R531 Weakness: Secondary | ICD-10-CM | POA: Diagnosis not present

## 2024-01-15 DIAGNOSIS — R253 Fasciculation: Secondary | ICD-10-CM | POA: Diagnosis not present

## 2024-01-16 DIAGNOSIS — E860 Dehydration: Secondary | ICD-10-CM | POA: Diagnosis not present

## 2024-01-16 DIAGNOSIS — R531 Weakness: Secondary | ICD-10-CM | POA: Diagnosis not present

## 2024-01-16 DIAGNOSIS — D61818 Other pancytopenia: Secondary | ICD-10-CM | POA: Diagnosis not present

## 2024-01-16 DIAGNOSIS — Z741 Need for assistance with personal care: Secondary | ICD-10-CM | POA: Diagnosis not present

## 2024-01-16 DIAGNOSIS — G809 Cerebral palsy, unspecified: Secondary | ICD-10-CM | POA: Diagnosis not present

## 2024-01-16 DIAGNOSIS — E119 Type 2 diabetes mellitus without complications: Secondary | ICD-10-CM | POA: Diagnosis not present

## 2024-01-16 DIAGNOSIS — R253 Fasciculation: Secondary | ICD-10-CM | POA: Diagnosis not present

## 2024-01-16 DIAGNOSIS — R1311 Dysphagia, oral phase: Secondary | ICD-10-CM | POA: Diagnosis not present

## 2024-01-18 DIAGNOSIS — W19XXXA Unspecified fall, initial encounter: Secondary | ICD-10-CM | POA: Diagnosis not present

## 2024-01-18 DIAGNOSIS — R531 Weakness: Secondary | ICD-10-CM | POA: Diagnosis not present

## 2024-01-20 DIAGNOSIS — Z91199 Patient's noncompliance with other medical treatment and regimen due to unspecified reason: Secondary | ICD-10-CM | POA: Diagnosis not present

## 2024-01-20 DIAGNOSIS — R531 Weakness: Secondary | ICD-10-CM | POA: Diagnosis not present

## 2024-01-20 DIAGNOSIS — R253 Fasciculation: Secondary | ICD-10-CM | POA: Diagnosis not present

## 2024-01-20 DIAGNOSIS — Z741 Need for assistance with personal care: Secondary | ICD-10-CM | POA: Diagnosis not present

## 2024-01-21 ENCOUNTER — Institutional Professional Consult (permissible substitution): Admitting: Diagnostic Neuroimaging

## 2024-01-21 ENCOUNTER — Telehealth: Payer: Self-pay | Admitting: Diagnostic Neuroimaging

## 2024-01-21 DIAGNOSIS — R531 Weakness: Secondary | ICD-10-CM | POA: Diagnosis not present

## 2024-01-21 DIAGNOSIS — Z741 Need for assistance with personal care: Secondary | ICD-10-CM | POA: Diagnosis not present

## 2024-01-21 DIAGNOSIS — Z91199 Patient's noncompliance with other medical treatment and regimen due to unspecified reason: Secondary | ICD-10-CM | POA: Diagnosis not present

## 2024-01-21 DIAGNOSIS — R253 Fasciculation: Secondary | ICD-10-CM | POA: Diagnosis not present

## 2024-01-21 DIAGNOSIS — M6281 Muscle weakness (generalized): Secondary | ICD-10-CM | POA: Diagnosis not present

## 2024-01-21 NOTE — Telephone Encounter (Signed)
 [facility] ([facility]) cancelled appointment due to can not contact guardian and patient can not come to appt by her self.

## 2024-01-22 DIAGNOSIS — Z91199 Patient's noncompliance with other medical treatment and regimen due to unspecified reason: Secondary | ICD-10-CM | POA: Diagnosis not present

## 2024-01-22 DIAGNOSIS — R531 Weakness: Secondary | ICD-10-CM | POA: Diagnosis not present

## 2024-01-22 DIAGNOSIS — R253 Fasciculation: Secondary | ICD-10-CM | POA: Diagnosis not present

## 2024-01-23 DIAGNOSIS — I10 Essential (primary) hypertension: Secondary | ICD-10-CM | POA: Diagnosis not present

## 2024-01-23 DIAGNOSIS — R531 Weakness: Secondary | ICD-10-CM | POA: Diagnosis not present

## 2024-01-23 DIAGNOSIS — M6281 Muscle weakness (generalized): Secondary | ICD-10-CM | POA: Diagnosis not present

## 2024-01-28 DIAGNOSIS — W19XXXA Unspecified fall, initial encounter: Secondary | ICD-10-CM | POA: Diagnosis not present

## 2024-01-28 DIAGNOSIS — Z7409 Other reduced mobility: Secondary | ICD-10-CM | POA: Diagnosis not present

## 2024-01-28 DIAGNOSIS — R531 Weakness: Secondary | ICD-10-CM | POA: Diagnosis not present

## 2024-01-29 ENCOUNTER — Other Ambulatory Visit: Payer: Self-pay | Admitting: Family Medicine

## 2024-01-29 DIAGNOSIS — Z91199 Patient's noncompliance with other medical treatment and regimen due to unspecified reason: Secondary | ICD-10-CM | POA: Diagnosis not present

## 2024-01-29 DIAGNOSIS — I10 Essential (primary) hypertension: Secondary | ICD-10-CM

## 2024-01-29 DIAGNOSIS — Z7409 Other reduced mobility: Secondary | ICD-10-CM | POA: Diagnosis not present

## 2024-01-29 DIAGNOSIS — R531 Weakness: Secondary | ICD-10-CM | POA: Diagnosis not present

## 2024-01-30 DIAGNOSIS — Z7409 Other reduced mobility: Secondary | ICD-10-CM | POA: Diagnosis not present

## 2024-01-30 DIAGNOSIS — R531 Weakness: Secondary | ICD-10-CM | POA: Diagnosis not present

## 2024-01-30 DIAGNOSIS — Z741 Need for assistance with personal care: Secondary | ICD-10-CM | POA: Diagnosis not present

## 2024-01-31 DIAGNOSIS — Z7409 Other reduced mobility: Secondary | ICD-10-CM | POA: Diagnosis not present

## 2024-01-31 DIAGNOSIS — R531 Weakness: Secondary | ICD-10-CM | POA: Diagnosis not present

## 2024-01-31 DIAGNOSIS — Z741 Need for assistance with personal care: Secondary | ICD-10-CM | POA: Diagnosis not present

## 2024-01-31 DIAGNOSIS — W19XXXA Unspecified fall, initial encounter: Secondary | ICD-10-CM | POA: Diagnosis not present

## 2024-02-02 DIAGNOSIS — R531 Weakness: Secondary | ICD-10-CM | POA: Diagnosis not present

## 2024-02-02 DIAGNOSIS — Z7409 Other reduced mobility: Secondary | ICD-10-CM | POA: Diagnosis not present

## 2024-02-02 DIAGNOSIS — Z741 Need for assistance with personal care: Secondary | ICD-10-CM | POA: Diagnosis not present

## 2024-02-05 ENCOUNTER — Emergency Department (HOSPITAL_COMMUNITY)

## 2024-02-05 ENCOUNTER — Encounter (HOSPITAL_COMMUNITY): Payer: Self-pay

## 2024-02-05 ENCOUNTER — Other Ambulatory Visit: Payer: Self-pay

## 2024-02-05 ENCOUNTER — Inpatient Hospital Stay (HOSPITAL_COMMUNITY)
Admission: EM | Admit: 2024-02-05 | Discharge: 2024-02-20 | DRG: 872 | Disposition: A | Discharge status: Skilled Nursing Facility | Attending: Family Medicine | Admitting: Family Medicine

## 2024-02-05 DIAGNOSIS — A419 Sepsis, unspecified organism: Secondary | ICD-10-CM | POA: Diagnosis not present

## 2024-02-05 DIAGNOSIS — E860 Dehydration: Secondary | ICD-10-CM | POA: Diagnosis not present

## 2024-02-05 DIAGNOSIS — Z6823 Body mass index (BMI) 23.0-23.9, adult: Secondary | ICD-10-CM | POA: Diagnosis not present

## 2024-02-05 DIAGNOSIS — Z800 Family history of malignant neoplasm of digestive organs: Secondary | ICD-10-CM

## 2024-02-05 DIAGNOSIS — Z833 Family history of diabetes mellitus: Secondary | ICD-10-CM

## 2024-02-05 DIAGNOSIS — R627 Adult failure to thrive: Secondary | ICD-10-CM

## 2024-02-05 DIAGNOSIS — Z0389 Encounter for observation for other suspected diseases and conditions ruled out: Secondary | ICD-10-CM | POA: Diagnosis not present

## 2024-02-05 DIAGNOSIS — R404 Transient alteration of awareness: Secondary | ICD-10-CM | POA: Diagnosis not present

## 2024-02-05 DIAGNOSIS — Z743 Need for continuous supervision: Secondary | ICD-10-CM | POA: Diagnosis not present

## 2024-02-05 DIAGNOSIS — Z789 Other specified health status: Secondary | ICD-10-CM | POA: Diagnosis not present

## 2024-02-05 DIAGNOSIS — D61818 Other pancytopenia: Secondary | ICD-10-CM | POA: Diagnosis not present

## 2024-02-05 DIAGNOSIS — B957 Other staphylococcus as the cause of diseases classified elsewhere: Secondary | ICD-10-CM | POA: Diagnosis not present

## 2024-02-05 DIAGNOSIS — B952 Enterococcus as the cause of diseases classified elsewhere: Secondary | ICD-10-CM | POA: Diagnosis not present

## 2024-02-05 DIAGNOSIS — K219 Gastro-esophageal reflux disease without esophagitis: Secondary | ICD-10-CM | POA: Diagnosis not present

## 2024-02-05 DIAGNOSIS — R55 Syncope and collapse: Secondary | ICD-10-CM | POA: Diagnosis not present

## 2024-02-05 DIAGNOSIS — E1169 Type 2 diabetes mellitus with other specified complication: Secondary | ICD-10-CM | POA: Diagnosis not present

## 2024-02-05 DIAGNOSIS — Z66 Do not resuscitate: Secondary | ICD-10-CM | POA: Diagnosis present

## 2024-02-05 DIAGNOSIS — G801 Spastic diplegic cerebral palsy: Secondary | ICD-10-CM

## 2024-02-05 DIAGNOSIS — Z7401 Bed confinement status: Secondary | ICD-10-CM | POA: Diagnosis not present

## 2024-02-05 DIAGNOSIS — E876 Hypokalemia: Secondary | ICD-10-CM | POA: Diagnosis not present

## 2024-02-05 DIAGNOSIS — Z79899 Other long term (current) drug therapy: Secondary | ICD-10-CM

## 2024-02-05 DIAGNOSIS — E785 Hyperlipidemia, unspecified: Secondary | ICD-10-CM | POA: Diagnosis present

## 2024-02-05 DIAGNOSIS — Z8249 Family history of ischemic heart disease and other diseases of the circulatory system: Secondary | ICD-10-CM

## 2024-02-05 DIAGNOSIS — R531 Weakness: Secondary | ICD-10-CM | POA: Diagnosis not present

## 2024-02-05 DIAGNOSIS — Z91148 Patient's other noncompliance with medication regimen for other reason: Secondary | ICD-10-CM | POA: Diagnosis not present

## 2024-02-05 DIAGNOSIS — Z808 Family history of malignant neoplasm of other organs or systems: Secondary | ICD-10-CM

## 2024-02-05 DIAGNOSIS — M2578 Osteophyte, vertebrae: Secondary | ICD-10-CM | POA: Diagnosis not present

## 2024-02-05 DIAGNOSIS — R569 Unspecified convulsions: Secondary | ICD-10-CM

## 2024-02-05 DIAGNOSIS — G40901 Epilepsy, unspecified, not intractable, with status epilepticus: Secondary | ICD-10-CM | POA: Diagnosis not present

## 2024-02-05 DIAGNOSIS — I959 Hypotension, unspecified: Secondary | ICD-10-CM | POA: Diagnosis not present

## 2024-02-05 DIAGNOSIS — R479 Unspecified speech disturbances: Secondary | ICD-10-CM

## 2024-02-05 DIAGNOSIS — R7881 Bacteremia: Secondary | ICD-10-CM

## 2024-02-05 DIAGNOSIS — R6510 Systemic inflammatory response syndrome (SIRS) of non-infectious origin without acute organ dysfunction: Secondary | ICD-10-CM

## 2024-02-05 DIAGNOSIS — F79 Unspecified intellectual disabilities: Secondary | ICD-10-CM | POA: Diagnosis present

## 2024-02-05 DIAGNOSIS — J45909 Unspecified asthma, uncomplicated: Secondary | ICD-10-CM | POA: Diagnosis not present

## 2024-02-05 DIAGNOSIS — Z4682 Encounter for fitting and adjustment of non-vascular catheter: Secondary | ICD-10-CM | POA: Diagnosis not present

## 2024-02-05 DIAGNOSIS — M6281 Muscle weakness (generalized): Secondary | ICD-10-CM | POA: Diagnosis not present

## 2024-02-05 DIAGNOSIS — A4152 Sepsis due to Pseudomonas: Secondary | ICD-10-CM | POA: Diagnosis not present

## 2024-02-05 DIAGNOSIS — R000 Tachycardia, unspecified: Secondary | ICD-10-CM | POA: Diagnosis not present

## 2024-02-05 DIAGNOSIS — E46 Unspecified protein-calorie malnutrition: Secondary | ICD-10-CM | POA: Diagnosis not present

## 2024-02-05 DIAGNOSIS — R6250 Unspecified lack of expected normal physiological development in childhood: Secondary | ICD-10-CM

## 2024-02-05 DIAGNOSIS — R262 Difficulty in walking, not elsewhere classified: Secondary | ICD-10-CM | POA: Diagnosis not present

## 2024-02-05 DIAGNOSIS — G809 Cerebral palsy, unspecified: Secondary | ICD-10-CM | POA: Diagnosis not present

## 2024-02-05 DIAGNOSIS — G40909 Epilepsy, unspecified, not intractable, without status epilepticus: Secondary | ICD-10-CM

## 2024-02-05 DIAGNOSIS — B9689 Other specified bacterial agents as the cause of diseases classified elsewhere: Secondary | ICD-10-CM | POA: Diagnosis not present

## 2024-02-05 LAB — I-STAT CG4 LACTIC ACID, ED: Lactic Acid, Venous: 1.3 mmol/L (ref 0.5–1.9)

## 2024-02-05 LAB — RESP PANEL BY RT-PCR (RSV, FLU A&B, COVID)  RVPGX2
Influenza A by PCR: NEGATIVE
Influenza B by PCR: NEGATIVE
Resp Syncytial Virus by PCR: NEGATIVE
SARS Coronavirus 2 by RT PCR: NEGATIVE

## 2024-02-05 LAB — CBC WITH DIFFERENTIAL/PLATELET
Abs Immature Granulocytes: 0.02 10*3/uL (ref 0.00–0.07)
Basophils Absolute: 0 10*3/uL (ref 0.0–0.1)
Basophils Relative: 0 %
Eosinophils Absolute: 0 10*3/uL (ref 0.0–0.5)
Eosinophils Relative: 0 %
HCT: 27.3 % — ABNORMAL LOW (ref 36.0–46.0)
Hemoglobin: 8.3 g/dL — ABNORMAL LOW (ref 12.0–15.0)
Immature Granulocytes: 0 %
Lymphocytes Relative: 12 %
Lymphs Abs: 0.6 10*3/uL — ABNORMAL LOW (ref 0.7–4.0)
MCH: 28.6 pg (ref 26.0–34.0)
MCHC: 30.4 g/dL (ref 30.0–36.0)
MCV: 94.1 fL (ref 80.0–100.0)
Monocytes Absolute: 0.2 10*3/uL (ref 0.1–1.0)
Monocytes Relative: 5 %
Neutro Abs: 4 10*3/uL (ref 1.7–7.7)
Neutrophils Relative %: 83 %
Platelets: 243 10*3/uL (ref 150–400)
RBC: 2.9 MIL/uL — ABNORMAL LOW (ref 3.87–5.11)
RDW: 14.8 % (ref 11.5–15.5)
WBC: 4.9 10*3/uL (ref 4.0–10.5)
nRBC: 0 % (ref 0.0–0.2)

## 2024-02-05 LAB — URINALYSIS, W/ REFLEX TO CULTURE (INFECTION SUSPECTED)
Bacteria, UA: NONE SEEN
Bilirubin Urine: NEGATIVE
Glucose, UA: NEGATIVE mg/dL
Hgb urine dipstick: NEGATIVE
Ketones, ur: NEGATIVE mg/dL
Leukocytes,Ua: NEGATIVE
Nitrite: NEGATIVE
Protein, ur: NEGATIVE mg/dL
Specific Gravity, Urine: 1.016 (ref 1.005–1.030)
pH: 5 (ref 5.0–8.0)

## 2024-02-05 LAB — COMPREHENSIVE METABOLIC PANEL WITH GFR
ALT: 13 U/L (ref 0–44)
AST: 20 U/L (ref 15–41)
Albumin: 2.8 g/dL — ABNORMAL LOW (ref 3.5–5.0)
Alkaline Phosphatase: 100 U/L (ref 38–126)
Anion gap: 10 (ref 5–15)
BUN: 5 mg/dL — ABNORMAL LOW (ref 6–20)
CO2: 24 mmol/L (ref 22–32)
Calcium: 8.3 mg/dL — ABNORMAL LOW (ref 8.9–10.3)
Chloride: 104 mmol/L (ref 98–111)
Creatinine, Ser: 0.9 mg/dL (ref 0.44–1.00)
GFR, Estimated: >60 mL/min (ref >60)
Glucose, Bld: 112 mg/dL — ABNORMAL HIGH (ref 70–99)
Potassium: 3.6 mmol/L (ref 3.5–5.1)
Sodium: 138 mmol/L (ref 135–145)
Total Bilirubin: 0.5 mg/dL (ref 0.0–1.2)
Total Protein: 7.1 g/dL (ref 6.5–8.1)

## 2024-02-05 LAB — PROTIME-INR
INR: 1.3 — ABNORMAL HIGH (ref 0.8–1.2)
Prothrombin Time: 16.5 s — ABNORMAL HIGH (ref 11.4–15.2)

## 2024-02-05 LAB — PHENYTOIN LEVEL, TOTAL: Phenytoin Lvl: <2.5 ug/mL — ABNORMAL LOW (ref 10.0–20.0)

## 2024-02-05 MED ORDER — ACETAMINOPHEN 650 MG RE SUPP: 650.0000 mg | Freq: Four times a day (QID) | RECTAL | Status: DC | PRN

## 2024-02-05 MED ORDER — LACTATED RINGERS IV BOLUS (SEPSIS): 1000.0000 mL | Freq: Once | INTRAVENOUS | Status: DC

## 2024-02-05 MED ORDER — LACOSAMIDE 50 MG PO TABS: 200.0000 mg | ORAL_TABLET | Freq: Two times a day (BID) | ORAL | Status: DC

## 2024-02-05 MED ORDER — ENOXAPARIN SODIUM 40 MG/0.4ML IJ SOSY
40.0000 mg | PREFILLED_SYRINGE | INTRAMUSCULAR | Status: DC
  Administered 2024-02-06 – 2024-02-20 (×15): 40 mg via SUBCUTANEOUS
  Filled 2024-02-05 (×15): qty 0.4

## 2024-02-05 MED ORDER — LEVETIRACETAM 500 MG PO TABS: 1500.0000 mg | ORAL_TABLET | Freq: Two times a day (BID) | ORAL | Status: DC

## 2024-02-05 MED ORDER — ACETAMINOPHEN 650 MG RE SUPP
650.0000 mg | Freq: Once | RECTAL | Status: AC
  Administered 2024-02-05: 650 mg via RECTAL
  Filled 2024-02-05: qty 1

## 2024-02-05 MED ORDER — SODIUM CHLORIDE 0.9 % IV BOLUS
1000.0000 mL | Freq: Once | INTRAVENOUS | Status: AC
  Administered 2024-02-05: 1000 mL via INTRAVENOUS

## 2024-02-05 MED ORDER — LEVETIRACETAM (KEPPRA) 500 MG/5 ML ADULT IV PUSH
1500.0000 mg | Freq: Once | INTRAVENOUS | Status: AC
  Administered 2024-02-05: 1500 mg via INTRAVENOUS

## 2024-02-05 MED ORDER — PERAMPANEL 8 MG PO TABS
8.0000 mg | ORAL_TABLET | Freq: Once | ORAL | Status: AC
  Administered 2024-02-06: 8 mg via ORAL
  Filled 2024-02-05: qty 1

## 2024-02-05 MED ORDER — PERAMPANEL 2 MG PO TABS: 4.0000 mg | ORAL_TABLET | Freq: Every day | ORAL | Status: DC

## 2024-02-05 MED ORDER — CLONAZEPAM 0.125 MG PO TBDP: 0.5000 mg | ORAL_TABLET | Freq: Three times a day (TID) | ORAL | Status: DC

## 2024-02-05 MED ORDER — LORAZEPAM 2 MG/ML IJ SOLN: 2.0000 mg | INTRAMUSCULAR | Status: DC | PRN

## 2024-02-05 MED ORDER — SODIUM CHLORIDE 0.9 % IV SOLN
700.0000 mg | Freq: Once | INTRAVENOUS | Status: AC
  Administered 2024-02-06: 700 mg via INTRAVENOUS
  Filled 2024-02-05: qty 14
  Filled 2024-02-05: qty 10

## 2024-02-05 MED ORDER — SODIUM CHLORIDE 0.9 % IV SOLN
2.0000 g | Freq: Once | INTRAVENOUS | Status: AC
  Administered 2024-02-05: 2 g via INTRAVENOUS
  Filled 2024-02-05: qty 12.5

## 2024-02-05 MED ORDER — LEVETIRACETAM (KEPPRA) 500 MG/5 ML ADULT IV PUSH: 1500.0000 mg | Freq: Two times a day (BID) | INTRAVENOUS | Status: DC

## 2024-02-05 MED ORDER — LEVETIRACETAM (KEPPRA) 500 MG/5 ML ADULT IV PUSH
2500.0000 mg | Freq: Once | INTRAVENOUS | Status: AC
  Administered 2024-02-05: 2500 mg via INTRAVENOUS

## 2024-02-05 MED ORDER — SODIUM CHLORIDE 0.9 % IV SOLN
200.0000 mg | Freq: Two times a day (BID) | INTRAVENOUS | Status: DC
  Filled 2024-02-05 (×2): qty 20

## 2024-02-05 NOTE — Hospital Course (Addendum)
 Patricia White is a 53 y.o.female with a history of cerebral palsy, seizure disorder, intellectual disability who was admitted to the [facility] teaching Service at [facility] for multiple seizures and meeting SIRS criteria. Her hospital course is detailed below:  Multiple seizures Patient was recently discharged home from SNF 3 days prior to presentation.  Arrived to the ED status post 7 seizures.  He was found to have low Vimpat  and Keppra  levels.  CT head and C-spine negative.  Neurology consulted due to the patient's nonadherence to AEDs recommended restarting since she was previously well-controlled at SNF.  Further workup revealed that the patient was possibly in status epilepticus at baseline, regularly.  No seizure-like activity noted while inpatient.  She was discharged to SNF for further rehabilitation.  SIRS criteria  Bacteremia Met SIRS criteria with fever to 101, tachypnea, tachycardia.  Patient did not have a leukocytosis on presentation.  CXR negative, blood cultures obtained and showed growth of multiple organisms, likely contaminant due to growth of numerous organisms without consistent symptoms.  She was started on ceftriaxone  on 6/25 which was broadened to Vancomycin  (6/25 - 6/29) and Zosyn  (6/25 - 7/1).  UA was negative for infection.  Quad screen negative.  Patient remained asymptomatic during admission.  APS Difficult social situation since her 26 year old grandmother takes care of her due to disability secondary to cerebral palsy.  History of APS reports in the past, due to concern of medication nonadherence causing breakthrough seizures.  Report filed on 6/26 and emergency petition for guardianship initiated. Nat Chang, phone number [phone], was granted emergency guardianship before discharge to SNF.  Other chronic conditions were medically managed with home medications and formulary alternatives as necessary (HLD, GERD)  PCP Follow-up Recommendations: Ensure she sees dentist  outpatient - poor dentition could be contributing to bacteremia. Ensure Neurology follow-up outpatient. Restart blood pressure medicines as indicated. Held at discharge.

## 2024-02-05 NOTE — Assessment & Plan Note (Addendum)
 SIRS criteria met with fever to 101, tachycardic to 110, tachypnea.  CXR negative for infection, UA pending, blood cultures obtained, RPP pending. - Admit to FMTS, attending Dr. Anders - Aggressive, Vital signs per floor - N.p.o., diet pending improvement in mental status - PT/OT to treat - VTE prophylaxis Lovenox  - Fluids: S/p 1 L bolus NS, BP stable will not continue fluids at this time - Continue antibiotics: s/p Cefepime x1, switched to CTX d/t neurological status for 48 hours if Bcx negative  - AM CBC/CMP  - Fall and seizure precautions - if Bcx negative for 48 hours and UA negative, DC antibiotics  - f/u UA  - Tylenol  as needed for fever

## 2024-02-05 NOTE — Progress Notes (Signed)
 Elink monitoring for the code sepsis protocol.

## 2024-02-05 NOTE — Assessment & Plan Note (Addendum)
 Neurology consulted in the ED.  Longstanding history of difficulty with medication adherence at home.  Of note patient was recently discharged from SNF back to home.  Preliminary lab work showing low levels of phenytoin . - Neurology consulted, appreciate recommendations - S/p Keppra  load, continue Keppra  1500 mg twice daily - Continue Klonopin  0.5 mg 3 times daily - Continue Vimpat  200 mg twice daily - May need to reopen APS case, patient may need guardianship to ensure she is receiving appropriate medications - TOC consult for medication adherence

## 2024-02-05 NOTE — ED Provider Notes (Signed)
 [place] EMERGENCY DEPARTMENT AT [facility] Provider Note   CSN: [id number] Arrival date & time: 02/05/24  2028     Patient presents with: Seizures   Patricia White is a 53 y.o. female.   Patient with history of asthma, cerebral palsy, Mental retardation, and Seizures presents today with complaints of seizure.  Patient currently postictal, protecting her airway but is unable to provide any history.  Therefore, history provided by EMS.  Patient apparently from home, reportedly had 2 seizures, EMS was called had 3 seizures with EMS staff. Was given versed  in route.  Patient reportedly numerous antiepileptic medications for seizures.   Level 5 caveat --- altered mental status   The history is provided by the patient. No language interpreter was used.  Seizures      Prior to Admission medications   Medication Sig Start Date End Date Taking? Authorizing Provider  atorvastatin  (LIPITOR) 40 MG tablet TAKE 1 TABLET BY MOUTH DAILY Patient taking differently: Take 40 mg by mouth in the morning. 07/30/23   Alba Sharper, MD  clonazePAM  (KLONOPIN ) 0.5 MG tablet Take 1 tablet (0.5 mg total) by mouth 3 (three) times daily. 11/26/23     famotidine  (PEPCID ) 20 MG tablet Take 1 tablet (20 mg total) by mouth 2 (two) times daily for 14 days. Patient taking differently: Take 20 mg by mouth daily. 07/17/23 11/20/23  Alba Sharper, MD  lacosamide  (VIMPAT ) 200 MG TABS tablet Take 1 tablet (200 mg total) by mouth 2 (two) times daily. 11/26/23     levETIRAcetam  (KEPPRA ) 750 MG tablet Take 2 tablets (1,500 mg total) by mouth 2 (two) times daily. 11/26/23   Alba Sharper, MD  pantoprazole  (PROTONIX ) 40 MG tablet Take 1 tablet (40 mg total) by mouth daily. 11/17/23   Alba Sharper, MD  Perampanel  4 MG TABS Take 1 tablet (4 mg total) by mouth daily. 11/26/23   Alba Sharper, MD  phenytoin  (DILANTIN ) 50 MG tablet Chew 2 tablets (100 mg total) by mouth 2 (two) times daily. 01/03/24   Elicia Hamlet, MD    Allergies: Patient has no known allergies.    Review of Systems  Neurological:  Positive for seizures.  All other systems reviewed and are negative.   Updated Vital Signs BP 121/79   Pulse (!) 105   Temp (!) 101 F (38.3 C) (Rectal)   Resp 13   Ht 5' 5 (1.651 m)   Wt 71.3 kg   SpO2 96%   BMI 26.16 kg/m   Physical Exam Vitals and nursing note reviewed.  Constitutional:      Appearance: She is not diaphoretic.     Comments: Chronically ill appearing  HENT:     Head: Normocephalic and atraumatic.   Eyes:     Pupils: Pupils are equal, round, and reactive to light.    Cardiovascular:     Rate and Rhythm: Tachycardia present.  Pulmonary:     Effort: Pulmonary effort is normal. No respiratory distress.     Breath sounds: Normal breath sounds.  Abdominal:     General: Abdomen is flat.     Palpations: Abdomen is soft.   Musculoskeletal:        General: Normal range of motion.     Cervical back: Normal range of motion.   Skin:    General: Skin is warm and dry.   Neurological:     General: No focal deficit present.     Comments: Withdraws to pain, does not speak  Psychiatric:  Mood and Affect: Mood normal.        Behavior: Behavior normal.     (all labs ordered are listed, but only abnormal results are displayed) Labs Reviewed  CBC WITH DIFFERENTIAL/PLATELET - Abnormal; Notable for the following components:      Result Value   RBC 2.90 (*)    Hemoglobin 8.3 (*)    HCT 27.3 (*)    Lymphs Abs 0.6 (*)    All other components within normal limits  PROTIME-INR - Abnormal; Notable for the following components:   Prothrombin Time 16.5 (*)    INR 1.3 (*)    All other components within normal limits  PHENYTOIN  LEVEL, TOTAL - Abnormal; Notable for the following components:   Phenytoin  Lvl <2.5 (*)    All other components within normal limits  COMPREHENSIVE METABOLIC PANEL WITH GFR - Abnormal; Notable for the following components:   Glucose,  Bld 112 (*)    BUN 5 (*)    Calcium  8.3 (*)    Albumin 2.8 (*)    All other components within normal limits  CULTURE, BLOOD (ROUTINE X 2)  CULTURE, BLOOD (ROUTINE X 2)  RESP PANEL BY RT-PCR (RSV, FLU A&B, COVID)  RVPGX2  URINALYSIS, W/ REFLEX TO CULTURE (INFECTION SUSPECTED)  LEVETIRACETAM  LEVEL  I-STAT CG4 LACTIC ACID, ED  I-STAT CG4 LACTIC ACID, ED    EKG: None  Radiology: CT Head Wo Contrast Result Date: 02/05/2024 CLINICAL DATA:  Multiple seizures EXAM: CT HEAD WITHOUT CONTRAST CT CERVICAL SPINE WITHOUT CONTRAST TECHNIQUE: Multidetector CT imaging of the head and cervical spine was performed following the standard protocol without intravenous contrast. Multiplanar CT image reconstructions of the cervical spine were also generated. RADIATION DOSE REDUCTION: This exam was performed according to the departmental dose-optimization program which includes automated exposure control, adjustment of the mA and/or kV according to patient size and/or use of iterative reconstruction technique. COMPARISON:  12/27/2023 FINDINGS: CT HEAD FINDINGS Brain: No evidence of acute infarction, hemorrhage, hydrocephalus, extra-axial collection or mass lesion/mass effect. Vascular: No hyperdense vessel or unexpected calcification. Skull: Normal. Negative for fracture or focal lesion. Sinuses/Orbits: No acute finding. Other: None. CT CERVICAL SPINE FINDINGS Alignment: Straightening of the normal cervical lordosis. Skull base and vertebrae: No acute fracture. No primary bone lesion or focal pathologic process. Soft tissues and spinal canal: No prevertebral fluid or swelling. No visible canal hematoma. Disc levels: Disc space heights preserved with anterior bridging osteophytes of the lower cervical and upper thoracic spine, in keeping with DISH. Upper chest: Negative. Other: None. IMPRESSION: 1. No acute intracranial pathology. 2. No fracture or static subluxation of the cervical spine. 3. Disc space heights preserved  with anterior bridging osteophytes of the lower cervical and upper thoracic spine, in keeping with DISH. Electronically Signed   By: Marolyn JONETTA Jaksch M.D.   On: 02/05/2024 22:23   CT Cervical Spine Wo Contrast Result Date: 02/05/2024 CLINICAL DATA:  Multiple seizures EXAM: CT HEAD WITHOUT CONTRAST CT CERVICAL SPINE WITHOUT CONTRAST TECHNIQUE: Multidetector CT imaging of the head and cervical spine was performed following the standard protocol without intravenous contrast. Multiplanar CT image reconstructions of the cervical spine were also generated. RADIATION DOSE REDUCTION: This exam was performed according to the departmental dose-optimization program which includes automated exposure control, adjustment of the mA and/or kV according to patient size and/or use of iterative reconstruction technique. COMPARISON:  12/27/2023 FINDINGS: CT HEAD FINDINGS Brain: No evidence of acute infarction, hemorrhage, hydrocephalus, extra-axial collection or mass lesion/mass effect. Vascular: No hyperdense vessel  or unexpected calcification. Skull: Normal. Negative for fracture or focal lesion. Sinuses/Orbits: No acute finding. Other: None. CT CERVICAL SPINE FINDINGS Alignment: Straightening of the normal cervical lordosis. Skull base and vertebrae: No acute fracture. No primary bone lesion or focal pathologic process. Soft tissues and spinal canal: No prevertebral fluid or swelling. No visible canal hematoma. Disc levels: Disc space heights preserved with anterior bridging osteophytes of the lower cervical and upper thoracic spine, in keeping with DISH. Upper chest: Negative. Other: None. IMPRESSION: 1. No acute intracranial pathology. 2. No fracture or static subluxation of the cervical spine. 3. Disc space heights preserved with anterior bridging osteophytes of the lower cervical and upper thoracic spine, in keeping with DISH. Electronically Signed   By: Marolyn JONETTA Jaksch M.D.   On: 02/05/2024 22:23   DG Chest Port 1 View Result  Date: 02/05/2024 CLINICAL DATA:  Possible sepsis EXAM: PORTABLE CHEST 1 VIEW COMPARISON:  12/27/2023 FINDINGS: Cardiac shadow is stable. The overall inspiratory effort is stable. No focal infiltrate or effusion is seen. No bony abnormality is noted. IMPRESSION: No active disease. Electronically Signed   By: Oneil Devonshire M.D.   On: 02/05/2024 21:39     .Critical Care  Performed by: Nora Lauraine LABOR, PA-C Authorized by: Sharona Rovner A, PA-C   Critical care provider statement:    Critical care time (minutes):  100   Critical care was necessary to treat or prevent imminent or life-threatening deterioration of the following conditions:  CNS failure or compromise and sepsis (status epilepticus)   Critical care was time spent personally by me on the following activities:  Development of treatment plan with patient or surrogate, discussions with consultants, discussions with primary provider, evaluation of patient's response to treatment, examination of patient, obtaining history from patient or surrogate, ordering and review of laboratory studies, ordering and review of radiographic studies, pulse oximetry and re-evaluation of patient's condition   Care discussed with: admitting provider      Medications Ordered in the ED  LORazepam  (ATIVAN ) injection 2 mg (has no administration in time range)  fosPHENYtoin  (CEREBYX ) 1,069.5 mg PE in sodium chloride  0.9 % 50 mL IVPB (has no administration in time range)  clonazepam  (KLONOPIN ) disintegrating tablet 0.5 mg (has no administration in time range)  lacosamide  (VIMPAT ) 200 mg in sodium chloride  0.9 % 25 mL IVPB (has no administration in time range)    Or  lacosamide  (VIMPAT ) tablet 200 mg (has no administration in time range)  levETIRAcetam  (KEPPRA ) undiluted injection 1,500 mg (has no administration in time range)    Or  levETIRAcetam  (KEPPRA ) tablet 1,500 mg (has no administration in time range)  acetaminophen  (TYLENOL ) suppository 650 mg (has no  administration in time range)  sodium chloride  0.9 % bolus 1,000 mL (0 mLs Intravenous Stopped 02/05/24 2325)  levETIRAcetam  (KEPPRA ) undiluted injection 1,500 mg (1,500 mg Intravenous Given 02/05/24 2041)  acetaminophen  (TYLENOL ) suppository 650 mg (650 mg Rectal Given 02/05/24 2057)  levETIRAcetam  (KEPPRA ) undiluted injection 2,500 mg (2,500 mg Intravenous Given 02/05/24 2049)  ceFEPIme  (MAXIPIME ) 2 g in sodium chloride  0.9 % 100 mL IVPB (0 g Intravenous Stopped 02/05/24 2130)                                    Medical Decision Making Amount and/or Complexity of Data Reviewed Labs: ordered. Radiology: ordered.  Risk OTC drugs. Prescription drug management.   This patient is a 53 y.o. female who  presents to the ED for concern of seizure, this involves an extensive number of treatment options, and is a complaint that carries with it a high risk of complications and morbidity. The emergent differential diagnosis prior to evaluation includes, but is not limited to,  The differential diagnosis for includes but is not limited to idiopathic seizure, traumatic brain injury, intracranial hemorrhage, vascular lesion, mass or space containing lesion, degenerative neurologic disease, congenital brain abnormality, infectious etiology such as meningitis, encephalitis or abscess, metabolic disturbance including hyper or hypoglycemia, hyper or hyponatremia, hyperosmolar state, uremia, hepatic failure, hypocalcemia, hypomagnesemia.  Toxic substances such as cocaine, lidocaine , antidepressants, theophylline, alcohol withdrawal, drug withdrawal, eclampsia, hypertensive encephalopathy and anoxic brain injury.   This is not an exhaustive differential.   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  Additional history: Chart reviewed. Pertinent results  include: patient with previous admissions for similar symptoms, attributed to medication nonadherance.  Looks like she was DNR/DNI on these admissions.  Physical Exam: Physical exam performed. The pertinent findings include: withdraws to pain  Lab Tests: I ordered, and personally interpreted labs.  The pertinent results include:  hgb 8.3, INR 1.3, phenytoin  level <2.5,    Imaging Studies: I ordered imaging studies including CXR, CT head, cervical spine. I independently visualized and interpreted imaging which showed no acute findings. I agree with the radiologist interpretation.  Medications: I ordered medication including keppra , ativan   for seizures, fluids, cefepime  for sepsis. Reevaluation of the patient after these medicines showed that the patient improved. I have reviewed the patients home medicines and have made adjustments as needed.  Consultations Obtained: I requested consultation with the neurology on call Dr. Jerrie,  and discussed lab and imaging findings as well as pertinent plan - they recommend: admit to medicine, they will see the patient in consultation and provide recommendations   Disposition: After consideration of the diagnostic results and the patients response to treatment, I feel that patient will require admission for concern for status epilepticus likely due to medication nonadherence consistent with previous admissions. Also met sepsis criteria, however no obvious source of infection found at this time.   Discussed patient with family medicine teaching service who accepts patient for admission.  This is a shared visit with supervising physician Dr. Franklyn who has independently evaluated patient & provided guidance in evaluation/management/disposition, in agreement with care   Final diagnoses:  Status epilepticus (HCC)  Sepsis, due to unspecified organism, unspecified whether acute organ dysfunction present [facility])    ED Discharge Orders     None           Nora Lauraine LABOR, PA-C 02/05/24 2341    Franklyn Sid SAILOR, MD 02/08/24 [id number]

## 2024-02-05 NOTE — Consult Note (Signed)
 NEUROLOGY CONSULT NOTE   Date of service: February 05, 2024 Patient Name: Patricia White MRN:  [id number] DOB:  May 25, 1971 Chief Complaint: Breakthrough seizures Requesting Provider: Franklyn Sid SAILOR, MD  History of Present Illness  Patricia White is a 53 y.o. 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  Her 51 year old grandmother endorses adherence, reports some concern for possible fevers but unclear how reliable this history is  However today when I review the contents of the patient's medication bag with grandmother at bedside, there are approximately 6 or 7 bottles of medication, only 1 of which is an antiseizure medication (clonazepam ).  When we discussed that the patient was discharged on 5 antiseizure medications as detailed below,  grandmother explains that this is the only medication that was mailed to her.  Concerning there are also a few scattered pills at the bottom of this bag outside of any medication bottles  On review of the chart, on admission 11/19/2023, her medications were; she presented with status epilepticus in the setting of subtherapeutic Depakote  and carbamazepine  levels - Depakote  500 mg twice daily - Tegretol  300 mg twice daily - Topamax  75 mg twice daily  Due to ongoing electrographic status, there was consideration for potential intubation (reversal of DNR) for seizure control, but ultimately her mental status was found to be improving despite ongoing electrographic status.  Ultimately her antiseizure regimen was adjusted to 5 agents  as below in addition to a prednisone  taper  She presented again on 5/15 again with medication levels low despite reports of adherence as well as concern for bedbugs; exam improved with reinitiation of her antiseizure medication regimen  Since then it does appear that the transition of care team has reach out to the patient/family without being able to reach them  Antiseizure medications at time of last discharge: Keppra  1500 mg twice daily Phenytoin  100 mg twice daily  Vimpat  200 mg twice daily Perampanel  4 mg daily  Clonazepam  0.5 mg 3 times daily       ROS  Unable to assess secondary to patient's mental status   Past History   Past Medical History:  Diagnosis Date   Asthma    Cerebral palsy (HCC) Birth   Mental retardation Birth   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   Seizures (HCC)     Past Surgical History:  Procedure Laterality Date   ESOPHAGOGASTRODUODENOSCOPY N/A 11/08/2023   Procedure: EGD (ESOPHAGOGASTRODUODENOSCOPY);  Surgeon: San Sandor GAILS, DO;  Location: [facility] ENDOSCOPY;  Service: Gastroenterology;  Laterality: N/A;   POLYPECTOMY  11/08/2023   Procedure: POLYPECTOMY;  Surgeon: San Sandor GAILS, DO;  Location: [facility] ENDOSCOPY;  Service: Gastroenterology;;    Family History: Family History  Problem Relation Age of Onset   Migraines Mother    Cancer Mother        liver cancer   Diabetes Maternal Aunt    Hypertension Maternal Grandmother    Colon cancer Maternal Grandfather 60   Prostate cancer Maternal Grandfather    Throat cancer Maternal Grandfather     Social History  reports that she has never smoked. She has never used smokeless tobacco. She reports that she does not drink alcohol and does not  use drugs.  No Known Allergies  Medications   Current Facility-Administered Medications:    LORazepam  (ATIVAN ) injection 2 mg, 2 mg, Intravenous, PRN, Smoot, Sarah A, PA-C   omalizumab  (XOLAIR ) prefilled syringe 300 mg, 300 mg, Subcutaneous, Q28 days, Jeneal Danita Macintosh, MD, 300 mg at 11/19/20 1559  Current Outpatient Medications:    atorvastatin  (LIPITOR) 40 MG tablet, TAKE 1 TABLET BY MOUTH DAILY (Patient taking differently: Take 40 mg by mouth in the morning.), Disp: 100 tablet, Rfl: 2   clonazePAM  (KLONOPIN ) 0.5 MG tablet, Take 1 tablet (0.5 mg total) by mouth 3 (three) times daily., Disp: 90 tablet, Rfl: 0   famotidine  (PEPCID ) 20 MG tablet, Take 1 tablet (20 mg total) by mouth 2 (two) times daily for 14 days. (Patient taking differently: Take 20 mg by mouth daily.), Disp: 28 tablet, Rfl: 0   lacosamide  (VIMPAT ) 200 MG TABS tablet, Take 1 tablet (200 mg total) by mouth 2 (two) times daily., Disp: 60 tablet, Rfl: 0   levETIRAcetam  (KEPPRA ) 750 MG tablet, Take 2 tablets (1,500 mg total) by mouth 2 (two) times daily., Disp: 120 tablet, Rfl: 0   pantoprazole  (PROTONIX ) 40 MG tablet, Take 1 tablet (40 mg total) by mouth daily., Disp: 30 tablet, Rfl: 0   Perampanel  4 MG TABS, Take 1 tablet (4 mg total) by mouth daily., Disp: 30 tablet, Rfl: 0   phenytoin  (DILANTIN ) 50 MG tablet, Chew 2 tablets (100 mg total) by mouth 2 (two) times daily., Disp: , Rfl:   Vitals   Vitals:   February 28, 2024 2033 02/28/2024 2040 02/28/2024 2130 2024/02/28 2200  BP: (!) 126/98  119/81 121/79  Pulse: (!) 117  (!) 110 (!) 105  Resp: (!) 22  (!) 23 13  Temp: (!) 101 F (38.3 C)     TempSrc: Rectal     SpO2: 93% 95% 99% 96%  Weight:      Height:        Body mass index is 26.16 kg/m.   Physical Exam   Constitutional: Appears comfortable Psych: Affect minimally interactive Eyes: No scleral injection HENT: No oropharyngeal obstruction.  MSK: no major joint deformities.  Cardiovascular: Normal rate and regular rhythm.  Respiratory: Effort normal, non-labored breathing GI: Soft.  No distension. There is no tenderness.  Skin: Warm dry and intact visible skin  Neurologic Examination   Patient is very sleepy with mostly roving eye movements.  With sustained noxious  stimulation she arouses enough to follow some simple commands.  She is mostly nonverbal, but does say ow and stop.  She is able to localize with the right upper extremity.  She is able to wiggle bilateral toes to command.  Extensor posturing with noxious stimulation on the left upper extremity.  Pupils equal round reactive to light.  Blink symmetric to corneal stimulation.   Labs/Imaging/Neurodiagnostic studies   CBC:  Recent Labs  Lab 02/28/2024 2054  WBC 4.9  NEUTROABS 4.0  HGB 8.3*  HCT 27.3*  MCV 94.1  PLT 243   Basic Metabolic Panel:  Lab Results  Component Value Date   NA 138 28-Feb-2024   K 3.6 02/28/2024   CO2 24 02/28/24   GLUCOSE 112 (H) 28-Feb-2024   BUN 5 (L) 02/28/2024   CREATININE 0.90 02/28/24   CALCIUM  8.3 (L) 02-28-2024   GFRNONAA >60 02-28-2024   GFRAA >60 01/19/2020   Lipid Panel:  Lab Results  Component Value Date   LDLCALC 121 (H) 11/23/2020   HgbA1c:  Lab Results  Component Value  Date   HGBA1C 5.3 11/23/2023   Urine Drug Screen: No results found for: LABOPIA, COCAINSCRNUR, LABBENZ, AMPHETMU, THCU, LABBARB  Alcohol Level No results found for: [facility] INR  Lab Results  Component Value Date   INR 1.3 (H) 02/05/2024   APTT  Lab Results  Component Value Date   APTT 28 09/14/2019   AED levels:  Lab Results  Component Value Date   PHENYTOIN  <2.5 (L) 02/05/2024   LEVETIRACETA 97.8 (H) 12/27/2023    Latest Reference Range & Units 11/22/23 12:35 12/27/23 18:27 12/28/23 11:13 12/28/23 22:59 01/03/24 04:30 02/05/24 20:54  Phenytoin  Lvl 10.0 - 20.0 ug/mL 14.0 <2.5 (L) >40.0 (HH) 10.1 9.1 (L) <2.5 (L)  (HH): Data is critically high (L): Data is abnormally low  CT Head without contrast 1. No acute intracranial pathology. 2. No fracture or static subluxation of the cervical spine. 3. Disc space heights preserved with anterior bridging osteophytes of the lower cervical and upper thoracic spine, in keeping with DISH.     ASSESSMENT   Patricia White is a 53 y.o. 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), cerebral palsy, developmental delay with left-sided spastic paresis, nonverbal, failure to thrive, dehydration/malnutrition  RECOMMENDATIONS  -Resume Keppra  1500 mg twice daily -Resume phenytoin  100 mg twice daily (15 mg/kg fospheny loading dose, then resume home dose) -Resume Vimpat  200 mg twice daily -Resume perampanel  4 mg nightly after 8 mg loading dose once  -Continue clonazepam  0.5 mg 3 times daily, which appears to be the only antiseizure medication patient was currently receiving at home -NGT for administration of perampanel  and clonazepam  -No role for continuous EEG monitoring at this time given that typically she has ongoing electrographic status epilepticus despite gradually improving examination and expect her to have gradually improving examination again with reinitiation of her home medications; please follow exam - Neurology will sign off at this time, anticipating no further inpatient neurological workup and gradual recovery as in prior admissions.  However if there is ongoing concern for clinical seizure activity, patient's mental status is not improving as expected, or other acute neurological questions or concerns arise, please do reach out to neurology ______________________________________________________________________   Lola Jernigan MD-PhD [facility] [phone]  CRITICAL CARE Performed by: Lola LITTIE Jernigan   Total critical care time: 45 minutes  Critical care time was exclusive of separately billable procedures and treating other patients.  Critical care was necessary to treat or prevent imminent or life-threatening deterioration -- status epilepticus  Critical care was time spent personally by me on the following activities: development of treatment plan with patient and/or surrogate as well as  nursing, discussions with consultants, evaluation of patient's response to treatment, examination of patient, obtaining history from patient or surrogate, ordering and performing treatments and interventions, ordering and review of laboratory studies, ordering and review of radiographic studies, pulse oximetry and re-evaluation of patient's condition.

## 2024-02-05 NOTE — H&P (Cosign Needed)
 Hospital Admission History and Physical Service Pager: [phone]  Patient name: AMINATA BUFFALO Medical record number: [id number] Date of Birth: [birth year] Age: 53 y.o. Gender: female  Primary Care Provider: Alba Sharper, MD Consultants: Neurology  Code Status: DNR which was confirmed with family if patient unable to confirm  Preferred Emergency Contact:  Contact Information     Name Relation Home Work Mobile   Depaoli,[name] Legal Guardian [phone]  [phone]   Colisha, Redler [phone]  [phone]      Other Contacts   None on File     Chief Complaint: Seizures   Assessment and Plan: RANEISHA BRESS is a 53 y.o. female presenting with multiple seizures. Differential for this patient's presentation of this includes infection lowering seizure threshold, difficulty with medication adherence, breakthrough seizures.  With patient meeting sepsis criteria will need to rule out infection.  Also concerning for medication nonadherence with phenytoin  level less than 2.5.  Keppra  level pending.  Grandmother present at bedside.  She brought patient's medications with her.  Unfortunately the clonazepam  was the only seizure medication present, which grandmother reports are the medication she has been receiving.  Patient was supposed to be on Keppra , Vimpat , phenytoin , perampanel  but has not been receiving these medications.  Grandmother reports she only gives her the medications she receives from the mail order pharmacy.  Difficult social situation.  Grandmother is 67 and aunt that lives also in the home has many medical problems that she (aunt) struggles to manage.  Overall disposition will be difficult due to repeat admissions for medication nonadherence.  Assessment & Plan Seizure [facility]) Neurology consulted in the ED.  Longstanding history of difficulty with medication adherence at home.  Of note patient was recently discharged from SNF back to home.  Preliminary lab  work showing low levels of phenytoin . - Neurology consulted, appreciate recommendations - S/p Keppra  load, continue Keppra  1500 mg twice daily - Continue Klonopin  0.5 mg 3 times daily - Continue Vimpat  200 mg twice daily - May need to reopen APS case, patient may need guardianship to ensure she is receiving appropriate medications - TOC consult for medication adherence SIRS (systemic inflammatory response syndrome) (HCC) SIRS criteria met with fever to 101, tachycardic to 110, tachypnea.  CXR negative for infection, UA pending, blood cultures obtained, RPP pending. - Admit to FMTS, attending Dr. Anders - Aggressive, Vital signs per floor - N.p.o., diet pending improvement in mental status - PT/OT to treat - VTE prophylaxis Lovenox  - Fluids: S/p 1 L bolus NS, BP stable will not continue fluids at this time - Continue antibiotics: s/p Cefepime x1, switched to CTX d/t neurological status for 48 hours if Bcx negative  - AM CBC/CMP  - Fall and seizure precautions - if Bcx negative for 48 hours and UA negative, DC antibiotics  - f/u UA  - Tylenol  as needed for fever Chronic health problem HLD: Hold atorvastatin  40 mg until patient is able to take p.o. GERD: Hold Protonix  40 mg until patient is able to take p.o.   FEN/GI: N.p.o. VTE Prophylaxis: Lovenox   Disposition: Progressive  History of Present Illness:  SHAKOYA GILMORE is a 53 y.o. female presenting from home via EMS for multiple seizures throughout the day.    She was discharged home from her last hospital admission to SNF.  Per pharmacy record she was discharged from SNF recently back to home.  EMS reports that patient had 3-4 seizures at home before they were called.  En route to  the hospital she had 3 more seizures.  She received Versed  and route with EMS.  Grandmother reports that she returned from SNF on Sunday.  She noted her to have decreased appetite for several days prior to presentation.  This morning she noted Natosha  was warm to touch and then began having seizures.  No other signs of infection.  Family member at home with a cough.  In the ED, vital signs significant for fever to 101, tachycardic to 110s, stable BP.  She proceeded to have an additional seizure while in the ED.  She was loaded with Keppra .  She met sepsis criteria with fever, tachycardia, tachypnea.  No leukocytosis.  Lactic acid WNL.  CXR normal, CT spine and head without contrast negative.  Neurology consulted in the ED.  She was started on empiric antibiotics for sepsis.  Urine still to be collected.  Blood cultures drawn.  Review Of Systems: Per HPI with the following additions: as above   Pertinent Past Medical History: CP Asthma Seizures Mental retardation Remainder reviewed in history tab.   Pertinent Past Surgical History: EGD 10/2023 Remainder reviewed in history tab.   Pertinent Social History: Tobacco use: No Alcohol use: No  Other Substance use: No Lives with family, recently discharged from SNF  Pertinent Family History: Mother: Migraines, liver cancer Remainder reviewed in history tab.   Important Outpatient Medications: Atorvastatin  40 mg Clonazepam  0.5 mg 3 times daily Famotidine  20 mg daily lacosamide  200 mg twice daily Keppra  1500 mg twice daily Protonix  40 mg daily Perampanel  4 mg daily Phenytoin  100 mg twice daily Remainder reviewed in medication history.   Objective: BP 121/79   Pulse (!) 105   Temp (!) 101 F (38.3 C) (Rectal)   Resp 13   Ht 5' 5 (1.651 m)   Wt 71.3 kg   SpO2 96%   BMI 26.16 kg/m  Exam: General: Sedated, will open eyes to voice and touch Eyes: Pupils are equal and reactive to light Cardiovascular: Regular rate, regular rhythm, no murmur on exam Respiratory: Clear, no increased work of breathing Gastrointestinal: Bowel sounds present, some guarding but does not grimace with palpation Neuro: Sedated, awakens to voice and touch, can follow commands  Labs:  CBC BMET   Recent Labs  Lab 02/05/24 2054  WBC 4.9  HGB 8.3*  HCT 27.3*  PLT 243   Recent Labs  Lab 02/05/24 2210  NA 138  K 3.6  CL 104  CO2 24  BUN 5*  CREATININE 0.90  GLUCOSE 112*  CALCIUM  8.3*    Pertinent additional labs RPP pending, blood cultures pending, phenytoin  level less than 2.5, INR 1.3, lactic acid 1.3.   EKG: Not obtained in the ED  Imaging Studies Performed:  CXR: Difficult to assess lung volumes, haziness on the right lung which may be contributing by patient positioning, no pleural effusion, normal cardiac silhouette.  CT head/C-spine no acute intracranial pathology, no acute fracture or static subluxation of C-spine   [name], DO 02/05/2024, 11:54 PM PGY-2,  Family Medicine  FPTS Intern pager: [phone], text pages welcome Secure chat group [facility] Teaching Service

## 2024-02-05 NOTE — Assessment & Plan Note (Signed)
 HLD: Hold atorvastatin  40 mg until patient is able to take p.o. GERD: Hold Protonix  40 mg until patient is able to take p.o.

## 2024-02-05 NOTE — ED Triage Notes (Signed)
 Pt BIB [facility] from home d/t 2 witnessed seizures with fam and 3 with EMS.  Pt has hx of same and Fam states she has not missed any meds.  EMS gave 10mg  VERSED  IM  Pt hot to touch and urinated on self.   Pt has hx of bedbugs but none witnessed with EMS or here.    BP 112/64 ST 120 RR 26 O2 90 arrived on NRB CBG 127

## 2024-02-05 NOTE — Consult Note (Incomplete)
 NEUROLOGY CONSULT NOTE   Date of service: February 05, 2024 Patient Name: Patricia White MRN:  [id number] DOB:  [birth year] Chief Complaint: Breakthrough seizures Requesting Provider: Franklyn Sid SAILOR, MD  History of Present Illness  Patricia White is a 53 y.o. 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  Her 16 year old grandmother endorses adherence.  However today when I review the contents of the patient's medication bag, there are approximately 6 or 7 bottles of medication, only 1 of which is an antiseizure medication (clonazepam ).  When we discussed that the patient was discharged on 5 antiseizure medications as detailed below,  grandmother explains that this is the only medication that was mailed to her.  Concerning there are also a few scattered pills at the bottom of this bag outside of any medication bottles  On review of the chart, on admission 11/19/2023, her medications were; she presented with status epilepticus in the setting of subtherapeutic Depakote  and carbamazepine  levels - Depakote  500 mg twice daily - Tegretol  300 mg twice daily - Topamax  75 mg twice daily  Due to ongoing electrographic status, there was consideration for potential intubation (reversal of DNR) for seizure control, but ultimately her mental status was found to be improving despite ongoing electrographic status.  Ultimately her antiseizure regimen was adjusted to 5 agents as below in addition to a prednisone  taper  She presented again on 5/15 again with medication levels low despite reports of adherence as well as concern for bedbugs  Antiseizure medications at time of last discharge: Keppra  1500 mg twice  daily Phenytoin  100 mg twice daily (15 mg/kg fospheny loading dose, ***) Vimpat  200 mg twice daily Perampanel  4 mg daily *** Clonazepam  0.5 mg 3 times daily     Recommendations  Continue Keppra , Vimpat  and phenytoin .  Continue perampanel  and clonazepam  as well-dosages as above EEG can be discontinued Continue to follow clinical exam and please recall if there is any change or concern for seizure.  May need antiseizure adjustment at that time but for now, would recommend continuing the current course. There was some concern about her coming in with bedbugs and her family taking care of her at home-will involve case management for appropriate management of placement and social issues-seizure also was likely related to medication noncompliance which might be related to the social issues. Plan was relayed to the primary team resident MD. Please call with questions as needed.  11/26/2023 Convulsive status epilepticus, resolved Electrographic status epilepticus, refractory - Etiology of status epilepticus: Patient had low levels of Depakote  and carbamazepine .  Grandmother states she has been giving patient medications.   Suspect there is some medication noncompliance (of note patient's grandmother is 55 herself).  About 1 year ago Depakote  level was 72 and about 2 years ago carbamazepine  level was 6.3   Recommendations - Continue prednisone  taper, starting at 60 mg and then tapering off by 10 mg every week. - Continue Keppra  1500 mg daily, Vimpat  200 mg twice daily, Dilantin  100 mg every 8 hours,  clonazepam  0.5 mg 3 times daily and Perampanel  4mg  daily -Discussed plan with medicine team via secure chat -Continue seizure precautions - Follow-up with [facility] in 4-6 weeks. Will likely benefit from repeat routine EEG at that time    LKW: *** Modified rankin score: {  Modified Rankin Scale:21264} IV Thrombolysis: ***Yes, *** No (reason) EVT: ***Yes, *** No (reason) ICH  Score:***  NIHSS components Score: Comment  1a Level of Conscious 0[]  1[]  2[]  3[]      1b LOC Questions 0[]  1[]  2[]       1c LOC Commands 0[]  1[]  2[]       2 Best Gaze 0[]  1[]  2[]       3 Visual 0[]  1[]  2[]  3[]      4 Facial Palsy 0[]  1[]  2[]  3[]      5a Motor Arm - left 0[]  1[]  2[]  3[]  4[]  UN[]    5b Motor Arm - Right 0[]  1[]  2[]  3[]  4[]  UN[]    6a Motor Leg - Left 0[]  1[]  2[]  3[]  4[]  UN[]    6b Motor Leg - Right 0[]  1[]  2[]  3[]  4[]  UN[]    7 Limb Ataxia 0[]  1[]  2[]  UN[]      8 Sensory 0[]  1[]  2[]  UN[]      9 Best Language 0[]  1[]  2[]  3[]      10 Dysarthria 0[]  1[]  2[]  UN[]      11 Extinct. and Inattention 0[]  1[]  2[]       TOTAL:       ROS  ***Comprehensive ROS performed and pertinent positives documented in HPI  ***Unable to ascertain due to ***  Past History   Past Medical History:  Diagnosis Date  . Asthma   . Cerebral palsy (HCC) Birth  . Mental retardation Birth  . 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 Seizures (HCC)     Past Surgical History:  Procedure Laterality Date  . ESOPHAGOGASTRODUODENOSCOPY N/A 11/08/2023   Procedure: EGD (ESOPHAGOGASTRODUODENOSCOPY);  Surgeon: San Sandor GAILS, DO;  Location: [facility] ENDOSCOPY;  Service: Gastroenterology;  Laterality: N/A;  . POLYPECTOMY  11/08/2023   Procedure: POLYPECTOMY;  Surgeon: San Sandor GAILS, DO;  Location: [facility] ENDOSCOPY;  Service: Gastroenterology;;    Family History: Family History  Problem Relation Age of Onset  . Migraines Mother   . Cancer Mother        liver cancer  . Diabetes Maternal Aunt   . Hypertension Maternal Grandmother   . Colon cancer Maternal Grandfather 60  . Prostate cancer Maternal Grandfather   . Throat cancer Maternal Grandfather     Social History  reports that she has never smoked. She has never used smokeless tobacco. She reports that she does not drink alcohol and does not use drugs.  No Known Allergies  Medications   Current Facility-Administered Medications:  .  LORazepam  (ATIVAN )  injection 2 mg, 2 mg, Intravenous, PRN, Smoot, Sarah A, PA-C .  omalizumab  (XOLAIR ) prefilled syringe 300 mg, 300 mg, Subcutaneous, Q28 days, Jeneal Danita Macintosh, MD, 300 mg at 11/19/20 1559  Current Outpatient Medications:  .  atorvastatin  (LIPITOR) 40 MG tablet, TAKE 1 TABLET BY MOUTH DAILY (Patient taking differently: Take 40 mg by mouth in the morning.), Disp: 100 tablet, Rfl: 2 .  clonazePAM  (KLONOPIN ) 0.5 MG tablet, Take 1 tablet (0.5 mg total) by mouth 3 (three) times daily., Disp: 90 tablet, Rfl: 0 .  famotidine  (PEPCID ) 20 MG tablet, Take 1 tablet (20 mg total) by mouth 2 (two) times daily for 14 days. (Patient taking differently: Take 20 mg by mouth daily.), Disp: 28 tablet, Rfl: 0 .  lacosamide  (VIMPAT ) 200 MG TABS tablet, Take 1 tablet (200 mg total) by mouth 2 (two) times daily., Disp: 60 tablet, Rfl: 0 .  levETIRAcetam  (KEPPRA ) 750 MG tablet, Take 2 tablets (1,500 mg total) by mouth 2 (two) times daily., Disp: 120 tablet,  Rfl: 0 .  pantoprazole  (PROTONIX ) 40 MG tablet, Take 1 tablet (40 mg total) by mouth daily., Disp: 30 tablet, Rfl: 0 .  Perampanel  4 MG TABS, Take 1 tablet (4 mg total) by mouth daily., Disp: 30 tablet, Rfl: 0 .  phenytoin  (DILANTIN ) 50 MG tablet, Chew 2 tablets (100 mg total) by mouth 2 (two) times daily., Disp: , Rfl:   Vitals   Vitals:   29-Feb-2024 2033 02-29-2024 2040 Feb 29, 2024 2130 2024-02-29 2200  BP: (!) 126/98  119/81 121/79  Pulse: (!) 117  (!) 110 (!) 105  Resp: (!) 22  (!) 23 13  Temp: (!) 101 F (38.3 C)     TempSrc: Rectal     SpO2: 93% 95% 99% 96%  Weight:      Height:        Body mass index is 26.16 kg/m.   Physical Exam   Constitutional: Appears well-developed and well-nourished.  Psych: Affect appropriate to situation, *** Eyes: No scleral injection HENT: No oropharyngeal obstruction.  MSK: no joint deformities.  Cardiovascular: Normal rate and regular rhythm. *** Perfusing extremities well Respiratory: Effort normal, non-labored  breathing GI: Soft.  No distension. There is no tenderness.  Skin: Warm dry and intact visible skin  Neurologic Examination   Mental Status: Patient is awake, alert, oriented to person, place, month, year, and situation.*** Patient is able to give a clear and coherent history.*** No signs of aphasia or neglect*** Cranial Nerves: II: Visual Fields are full. Pupils are equal, round, and reactive to light.  *** III,IV, VI: EOMI without ptosis or diploplia.  V: Facial sensation is symmetric to temperature VII: Facial movement is symmetric.  VIII: hearing is intact to voice X: Uvula elevates symmetrically XI: Shoulder shrug is symmetric. XII: tongue is midline without atrophy or fasciculations.  Motor: Tone is normal. Bulk is normal. 5/5 strength was present in all four extremities. *** Sensory: Sensation is symmetric to light touch and temperature in the arms and legs.*** Deep Tendon Reflexes: 2+ and symmetric in the brachioradialis and patellae. *** Plantars: Toes are downgoing bilaterally. *** Cerebellar: FNF and HKS are intact bilaterally*** Gait:  Deferred in acute setting ***   Labs/Imaging/Neurodiagnostic studies   CBC:  Recent Labs  Lab 02/29/2024 2054  WBC 4.9  NEUTROABS 4.0  HGB 8.3*  HCT 27.3*  MCV 94.1  PLT 243   Basic Metabolic Panel:  Lab Results  Component Value Date   NA 138 02-29-2024   K 3.6 02/29/24   CO2 24 02-29-24   GLUCOSE 112 (H) 2024-02-29   BUN 5 (L) 2024-02-29   CREATININE 0.90 02-29-2024   CALCIUM  8.3 (L) 2024/02/29   GFRNONAA >60 2024-02-29   GFRAA >60 01/19/2020   Lipid Panel:  Lab Results  Component Value Date   LDLCALC 121 (H) 11/23/2020   HgbA1c:  Lab Results  Component Value Date   HGBA1C 5.3 11/23/2023   Urine Drug Screen: No results found for: LABOPIA, COCAINSCRNUR, LABBENZ, AMPHETMU, THCU, LABBARB  Alcohol Level No results found for: [facility] INR  Lab Results  Component Value Date   INR 1.3 (H)  2024/02/29   APTT  Lab Results  Component Value Date   APTT 28 09/14/2019   AED levels:  Lab Results  Component Value Date   PHENYTOIN  <2.5 (L) 2024-02-29   LEVETIRACETA 97.8 (H) 12/27/2023    Latest Reference Range & Units 11/22/23 12:35 12/27/23 18:27 12/28/23 11:13 12/28/23 22:59 01/03/24 04:30 02/29/2024 20:54  Phenytoin  Lvl 10.0 - 20.0 ug/mL 14.0 <  2.5 (L) >40.0 (HH) 10.1 9.1 (L) <2.5 (L)  (HH): Data is critically high (L): Data is abnormally low  CT Head without contrast(Personally reviewed): ***  CT angio Head and Neck with contrast(Personally reviewed): ***  MR Angio head without contrast and Carotid Duplex BL(Personally reviewed): ***  MRI Brain(Personally reviewed): ***  Neurodiagnostics rEEG:  ***  ASSESSMENT   Patricia White is a 53 y.o. female ***  RECOMMENDATIONS  *** ______________________________________________________________________    Bonney Lola LITTIE Jerrie, MD [facility]

## 2024-02-06 ENCOUNTER — Inpatient Hospital Stay (HOSPITAL_COMMUNITY)

## 2024-02-06 DIAGNOSIS — G40901 Epilepsy, unspecified, not intractable, with status epilepticus: Secondary | ICD-10-CM | POA: Diagnosis not present

## 2024-02-06 DIAGNOSIS — A419 Sepsis, unspecified organism: Secondary | ICD-10-CM

## 2024-02-06 LAB — COMPREHENSIVE METABOLIC PANEL WITH GFR
ALT: 12 U/L (ref 0–44)
AST: 19 U/L (ref 15–41)
Albumin: 2.7 g/dL — ABNORMAL LOW (ref 3.5–5.0)
Alkaline Phosphatase: 91 U/L (ref 38–126)
Anion gap: 11 (ref 5–15)
BUN: <5 mg/dL — ABNORMAL LOW (ref 6–20)
CO2: 20 mmol/L — ABNORMAL LOW (ref 22–32)
Calcium: 8.2 mg/dL — ABNORMAL LOW (ref 8.9–10.3)
Chloride: 106 mmol/L (ref 98–111)
Creatinine, Ser: 0.81 mg/dL (ref 0.44–1.00)
GFR, Estimated: >60 mL/min (ref >60)
Glucose, Bld: 98 mg/dL (ref 70–99)
Potassium: 3.4 mmol/L — ABNORMAL LOW (ref 3.5–5.1)
Sodium: 137 mmol/L (ref 135–145)
Total Bilirubin: 0.5 mg/dL (ref 0.0–1.2)
Total Protein: 7.2 g/dL (ref 6.5–8.1)

## 2024-02-06 LAB — BLOOD CULTURE ID PANEL (REFLEXED) - BCID2
A.calcoaceticus-baumannii: NOT DETECTED
Bacteroides fragilis: NOT DETECTED
CTX-M ESBL: NOT DETECTED
Candida albicans: NOT DETECTED
Candida auris: NOT DETECTED
Candida glabrata: NOT DETECTED
Candida krusei: NOT DETECTED
Candida parapsilosis: NOT DETECTED
Candida tropicalis: NOT DETECTED
Carbapenem resist OXA 48 LIKE: NOT DETECTED
Carbapenem resistance IMP: NOT DETECTED
Carbapenem resistance KPC: NOT DETECTED
Carbapenem resistance NDM: NOT DETECTED
Carbapenem resistance VIM: NOT DETECTED
Cryptococcus neoformans/gattii: NOT DETECTED
Enterobacter cloacae complex: NOT DETECTED
Enterobacterales: DETECTED — AB
Enterococcus Faecium: NOT DETECTED
Enterococcus faecalis: NOT DETECTED
Escherichia coli: NOT DETECTED
Haemophilus influenzae: NOT DETECTED
Klebsiella aerogenes: NOT DETECTED
Klebsiella oxytoca: NOT DETECTED
Klebsiella pneumoniae: NOT DETECTED
Listeria monocytogenes: NOT DETECTED
Methicillin resistance mecA/C: DETECTED — AB
Neisseria meningitidis: NOT DETECTED
Proteus species: NOT DETECTED
Pseudomonas aeruginosa: NOT DETECTED
Salmonella species: NOT DETECTED
Serratia marcescens: NOT DETECTED
Staphylococcus aureus (BCID): NOT DETECTED
Staphylococcus epidermidis: DETECTED — AB
Staphylococcus lugdunensis: NOT DETECTED
Staphylococcus species: DETECTED — AB
Stenotrophomonas maltophilia: NOT DETECTED
Streptococcus agalactiae: NOT DETECTED
Streptococcus pneumoniae: NOT DETECTED
Streptococcus pyogenes: NOT DETECTED
Streptococcus species: NOT DETECTED

## 2024-02-06 LAB — CBC
HCT: 31 % — ABNORMAL LOW (ref 36.0–46.0)
Hemoglobin: 9.5 g/dL — ABNORMAL LOW (ref 12.0–15.0)
MCH: 28 pg (ref 26.0–34.0)
MCHC: 30.6 g/dL (ref 30.0–36.0)
MCV: 91.4 fL (ref 80.0–100.0)
Platelets: 448 10*3/uL — ABNORMAL HIGH (ref 150–400)
RBC: 3.39 MIL/uL — ABNORMAL LOW (ref 3.87–5.11)
RDW: 14.7 % (ref 11.5–15.5)
WBC: 7.3 10*3/uL (ref 4.0–10.5)
nRBC: 0 % (ref 0.0–0.2)

## 2024-02-06 LAB — GLUCOSE, CAPILLARY: Glucose-Capillary: 75 mg/dL (ref 70–99)

## 2024-02-06 MED ORDER — DEXTROSE IN LACTATED RINGERS 5 % IV SOLN: INTRAVENOUS | Status: AC

## 2024-02-06 MED ORDER — SODIUM CHLORIDE 0.9 % IV SOLN: 2.0000 g | Freq: Three times a day (TID) | INTRAVENOUS | Status: DC

## 2024-02-06 MED ORDER — PHENYTOIN 50 MG PO CHEW
100.0000 mg | CHEWABLE_TABLET | Freq: Two times a day (BID) | ORAL | Status: DC
  Filled 2024-02-06 (×2): qty 2

## 2024-02-06 MED ORDER — ATORVASTATIN CALCIUM 40 MG PO TABS: 40.0000 mg | ORAL_TABLET | Freq: Every day | ORAL | Status: DC

## 2024-02-06 MED ORDER — SODIUM CHLORIDE 0.9 % IV SOLN
200.0000 mg | Freq: Two times a day (BID) | INTRAVENOUS | Status: DC
  Administered 2024-02-06: 200 mg via INTRAVENOUS
  Filled 2024-02-06 (×2): qty 20

## 2024-02-06 MED ORDER — ACETAMINOPHEN 325 MG PO TABS: 650.0000 mg | ORAL_TABLET | Freq: Four times a day (QID) | ORAL | Status: DC | PRN

## 2024-02-06 MED ORDER — LACOSAMIDE 50 MG PO TABS: 200.0000 mg | ORAL_TABLET | Freq: Two times a day (BID) | ORAL | Status: DC

## 2024-02-06 MED ORDER — PERAMPANEL 2 MG PO TABS: 4.0000 mg | ORAL_TABLET | Freq: Every day | ORAL | Status: DC

## 2024-02-06 MED ORDER — POTASSIUM CHLORIDE 20 MEQ PO PACK
40.0000 meq | PACK | Freq: Once | ORAL | Status: AC
  Administered 2024-02-06: 40 meq
  Filled 2024-02-06: qty 2

## 2024-02-06 MED ORDER — LACOSAMIDE 200 MG PO TABS
200.0000 mg | ORAL_TABLET | Freq: Two times a day (BID) | ORAL | Status: DC
  Administered 2024-02-06: 200 mg via ORAL
  Filled 2024-02-06: qty 1

## 2024-02-06 MED ORDER — VANCOMYCIN HCL 1500 MG/300ML IV SOLN
1500.0000 mg | Freq: Once | INTRAVENOUS | Status: AC
  Administered 2024-02-06: 1500 mg via INTRAVENOUS
  Filled 2024-02-06 (×2): qty 300

## 2024-02-06 MED ORDER — ACETAMINOPHEN 10 MG/ML IV SOLN
1000.0000 mg | Freq: Once | INTRAVENOUS | Status: AC
  Administered 2024-02-07: 1000 mg via INTRAVENOUS
  Filled 2024-02-06: qty 100

## 2024-02-06 MED ORDER — CLONAZEPAM 0.125 MG PO TBDP: 0.5000 mg | ORAL_TABLET | Freq: Three times a day (TID) | ORAL | Status: DC

## 2024-02-06 MED ORDER — PIPERACILLIN-TAZOBACTAM 3.375 G IVPB
3.3750 g | Freq: Three times a day (TID) | INTRAVENOUS | Status: AC
  Administered 2024-02-06 – 2024-02-12 (×19): 3.375 g via INTRAVENOUS
  Filled 2024-02-06 (×19): qty 50

## 2024-02-06 MED ORDER — PANTOPRAZOLE SODIUM 40 MG IV SOLR: 40.0000 mg | Freq: Every day | INTRAVENOUS | Status: DC

## 2024-02-06 MED ORDER — LEVETIRACETAM (KEPPRA) 500 MG/5 ML ADULT IV PUSH
1500.0000 mg | Freq: Two times a day (BID) | INTRAVENOUS | Status: DC
  Administered 2024-02-06: 1500 mg via INTRAVENOUS
  Filled 2024-02-06: qty 15

## 2024-02-06 MED ORDER — LEVETIRACETAM 750 MG PO TABS
1500.0000 mg | ORAL_TABLET | Freq: Two times a day (BID) | ORAL | Status: DC
  Administered 2024-02-06 – 2024-02-20 (×28): 1500 mg via ORAL
  Filled 2024-02-06 (×28): qty 2

## 2024-02-06 MED ORDER — PHENYTOIN 50 MG PO CHEW
100.0000 mg | CHEWABLE_TABLET | Freq: Two times a day (BID) | ORAL | Status: DC
  Administered 2024-02-06: 100 mg via ORAL
  Filled 2024-02-06: qty 2

## 2024-02-06 MED ORDER — ORAL CARE MOUTH RINSE: 15.0000 mL | OROMUCOSAL | Status: DC | PRN

## 2024-02-06 MED ORDER — SODIUM CHLORIDE 0.9 % IV SOLN
2.0000 g | INTRAVENOUS | Status: DC
  Administered 2024-02-06: 2 g via INTRAVENOUS
  Filled 2024-02-06: qty 20

## 2024-02-06 MED ORDER — PANTOPRAZOLE SODIUM 40 MG PO TBEC: 40.0000 mg | DELAYED_RELEASE_TABLET | Freq: Every day | ORAL | Status: DC

## 2024-02-06 MED ORDER — PANTOPRAZOLE SODIUM 40 MG PO TBEC
40.0000 mg | DELAYED_RELEASE_TABLET | Freq: Every day | ORAL | Status: DC
  Administered 2024-02-06 – 2024-02-20 (×15): 40 mg via ORAL
  Filled 2024-02-06 (×15): qty 1

## 2024-02-06 MED ORDER — CLONAZEPAM 0.5 MG PO TBDP
0.5000 mg | ORAL_TABLET | Freq: Three times a day (TID) | ORAL | Status: DC
  Administered 2024-02-06 (×2): 0.5 mg via ORAL
  Filled 2024-02-06 (×2): qty 1

## 2024-02-06 MED ORDER — ATORVASTATIN CALCIUM 40 MG PO TABS
40.0000 mg | ORAL_TABLET | Freq: Every day | ORAL | Status: DC
  Administered 2024-02-07 – 2024-02-12 (×6): 40 mg via ORAL
  Filled 2024-02-06 (×6): qty 1

## 2024-02-06 MED ORDER — PERAMPANEL 2 MG PO TABS
4.0000 mg | ORAL_TABLET | Freq: Every day | ORAL | Status: DC
  Administered 2024-02-06: 4 mg via ORAL
  Filled 2024-02-06: qty 2

## 2024-02-06 MED ORDER — ORAL CARE MOUTH RINSE
15.0000 mL | OROMUCOSAL | Status: DC
  Administered 2024-02-06 – 2024-02-20 (×47): 15 mL via OROMUCOSAL

## 2024-02-06 MED ORDER — SODIUM CHLORIDE 0.9 % IV SOLN
200.0000 mg | Freq: Two times a day (BID) | INTRAVENOUS | Status: DC
  Filled 2024-02-06 (×2): qty 20

## 2024-02-06 MED ORDER — LEVETIRACETAM 500 MG PO TABS: 1500.0000 mg | ORAL_TABLET | Freq: Two times a day (BID) | ORAL | Status: DC

## 2024-02-06 MED ORDER — VANCOMYCIN HCL 750 MG/150ML IV SOLN
750.0000 mg | Freq: Two times a day (BID) | INTRAVENOUS | Status: DC
  Administered 2024-02-07 – 2024-02-10 (×7): 750 mg via INTRAVENOUS
  Filled 2024-02-06 (×9): qty 150

## 2024-02-06 NOTE — Progress Notes (Signed)
 Pharmacy Antibiotic Note  Patricia White is a 53 y.o. female admitted on 02/05/2024 with MRSE + Enterobacterales bacteremia.  Pharmacy has been consulted for Vancomycin + Zosyn dosing.  Plan: - Vancomycin 1500 mg IV x 1 followed by 750 mg IV every 12 hours (eAUC 444, Vd 0.72, SCr 0.81) - Zosyn 3.375g IV every 8 hours - Will continue to follow renal function, culture results, LOT, and antibiotic de-escalation plans   Height: 5' 5 (165.1 cm) Weight: 71.3 kg (157 lb 3 oz) IBW/kg (Calculated) : 57  Temp (24hrs), Avg:99.1 F (37.3 C), Min:97 F (36.1 C), Max:101 F (38.3 C)  Recent Labs  Lab 02/05/24 2054 02/05/24 2210 02/05/24 2213 02/06/24 0515  WBC 4.9  --   --  7.3  CREATININE  --  0.90  --  0.81  LATICACIDVEN  --   --  1.3  --     Estimated Creatinine Clearance: 81.3 mL/min (by C-G formula based on SCr of 0.81 mg/dL).    No Known Allergies  Antimicrobials this admission: Cefepime 6/24 x 1 Ceftriaxone  6/25 x 1 Vancomycin 6/25 >> Zosyn 6/25 >>  Microbiology results: 6/24 BCx >> 3/4 GPC (BCID MRSE); 1/4 GNR (BCID Enterobacterales) 6/24 COVID/flu/RSV >> neg  Thank you for allowing pharmacy to be a part of this patient's care.  Almarie Lunger, PharmD, BCPS, BCIDP Infectious Diseases Clinical Pharmacist 02/06/2024 3:28 PM   **Pharmacist phone directory can now be found on [email or web address] ([email or web address]).  Listed under [facility].

## 2024-02-06 NOTE — Plan of Care (Signed)
  Problem: Education: Goal: Knowledge of General Education information will improve Description: Including pain rating scale, medication(s)/side effects and non-pharmacologic comfort measures Outcome: Not Progressing   Problem: Health Behavior/Discharge Planning: Goal: Ability to manage health-related needs will improve Outcome: Not Progressing   Problem: Clinical Measurements: Goal: Ability to maintain clinical measurements within normal limits will improve Outcome: Not Progressing Goal: Will remain free from infection Outcome: Not Progressing Goal: Diagnostic test results will improve Outcome: Not Progressing Goal: Respiratory complications will improve Outcome: Progressing Goal: Cardiovascular complication will be avoided Outcome: Not Progressing   Problem: Activity: Goal: Risk for activity intolerance will decrease Outcome: Not Progressing   Problem: Nutrition: Goal: Adequate nutrition will be maintained Outcome: Progressing   Problem: Coping: Goal: Level of anxiety will decrease Outcome: Progressing   Problem: Elimination: Goal: Will not experience complications related to bowel motility Outcome: Not Progressing Goal: Will not experience complications related to urinary retention Outcome: Not Progressing   Problem: Pain Managment: Goal: General experience of comfort will improve and/or be controlled Outcome: Progressing   Problem: Safety: Goal: Ability to remain free from injury will improve Outcome: Not Progressing   Problem: Skin Integrity: Goal: Risk for impaired skin integrity will decrease Outcome: Not Progressing   Problem: Education: Goal: Expressions of having a comfortable level of knowledge regarding the disease process will increase Outcome: Not Progressing   Problem: Coping: Goal: Ability to adjust to condition or change in health will improve Outcome: Not Progressing Goal: Ability to identify appropriate support needs will improve Outcome:  Not Progressing   Problem: Health Behavior/Discharge Planning: Goal: Compliance with prescribed medication regimen will improve Outcome: Not Progressing   Problem: Clinical Measurements: Goal: Complications related to the disease process, condition or treatment will be avoided or minimized Outcome: Not Progressing Goal: Diagnostic test results will improve Outcome: Not Progressing   Problem: Safety: Goal: Verbalization of understanding the information provided will improve Outcome: Not Progressing   Problem: Self-Concept: Goal: Level of anxiety will decrease Outcome: Not Progressing Goal: Ability to verbalize feelings about condition will improve Outcome: Not Progressing

## 2024-02-06 NOTE — Assessment & Plan Note (Addendum)
 Met SIRS criteria on admission (fever 101, tachycardic 110, & tachypneic).  CXR negative for infection, UA negative, and RPP negative.  Source unknown at this time, fluid resuscitated, and will follow blood cultures obtained in the ED.  - Monitor fever curve - Continue CTX d/t neurological status for 48 hours, will discontinue abx if Bcx negative for 48h - Tylenol  as needed for fever, likely secondary to seizures

## 2024-02-06 NOTE — Assessment & Plan Note (Addendum)
 HLD: Atorvastatin  40 mg GERD: Protonix  40 mg

## 2024-02-06 NOTE — Assessment & Plan Note (Signed)
 Longstanding history of difficulty with medication adherence at home.  Patient was stable and during her last admission and at [facility].  Phenytoin  level <2.5. - Keppra  1500 mg BID - Klonopin  0.5 mg TID - Vimpat  200 mg BID - Perampanel  4 mg daily - Phenytoin  100 mg BID - May need to reopen APS case, patient may need guardianship to ensure she is receiving appropriate medications - TOC consult for medication adherence - PT/OT to treat - Fall and seizure precautions

## 2024-02-06 NOTE — Evaluation (Signed)
 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   Seizures (HCC)    Past Surgical History:  Past Surgical History:  Procedure Laterality Date   ESOPHAGOGASTRODUODENOSCOPY N/A 11/08/2023   Procedure: EGD (ESOPHAGOGASTRODUODENOSCOPY);  Surgeon: San Sandor GAILS, DO;  Location: [facility] ENDOSCOPY;  Service: Gastroenterology;  Laterality: N/A;   POLYPECTOMY  11/08/2023   Procedure: POLYPECTOMY;  Surgeon: San Sandor GAILS, DO;  Location: [facility] ENDOSCOPY;  Service: Gastroenterology;;   HPI:  Patricia White is a 53 yo female with PMH of cerebral palsy, seizure disorder, HLD, and GERD presenting to ED 6/24 with multiple seizures in the setting of medication nonadherance. Meets criteria for SIRS with unknown source. CXR negative. Seen by SLP 11/03/23 with recommendations to continue regular diet with thin liquids.    Assessment / Plan / Recommendation  Clinical Impression  Pt leans to the R but oral motor exam appeared grossly functional. She took consecutive straw sips of thin liquids with one instance of delayed coughing. Oral transit was mildly prolonged with purees and solids but she ultimately achieves complete oral clearance and uses a liquid wash with prompting. Recommend resuming regular diet with thin liquids. She will require assistance with feeding and POs should only be provided when pt is fully awake and alert. SLP will f/u at least briefly. SLP Visit Diagnosis: Dysphagia, unspecified (R13.10)    Aspiration Risk  Mild aspiration risk    Diet Recommendation Regular;Thin liquid    Liquid Administration via:  Cup;Straw Medication Administration: Whole meds with liquid Supervision: Staff to assist with self feeding;Full supervision/cueing for compensatory strategies Compensations: Minimize environmental distractions;Slow rate;Small sips/bites Postural Changes: Seated upright at 90 degrees;Remain upright for at least 30 minutes after po intake    Other  Recommendations Oral Care Recommendations: Oral care BID     Assistance Recommended at Discharge    Functional Status Assessment Patient has had a recent decline in their functional status and demonstrates the ability to make significant improvements in function in a reasonable and predictable amount of time.  Frequency and Duration min 2x/week  1 week       Prognosis Prognosis for improved oropharyngeal function: Good Barriers to Reach Goals: Time post onset      Swallow Study   General HPI: Patricia White is a 53 yo female with PMH of cerebral palsy, seizure disorder, HLD, and GERD presenting to ED 6/24 with multiple seizures in the setting of medication nonadherance. Meets criteria for SIRS with unknown source. CXR negative. Seen by SLP 11/03/23 with recommendations to continue regular diet with thin liquids. Type of Study: Bedside Swallow Evaluation Previous Swallow Assessment: see HPI Diet Prior to this Study: NPO Temperature Spikes Noted: No Respiratory Status: Room air History of Recent Intubation: No Behavior/Cognition: Alert;Cooperative Oral Cavity Assessment: Within Functional Limits Oral Care Completed by SLP: No Oral Cavity - Dentition: Adequate natural dentition Vision: Functional for self-feeding Self-Feeding Abilities: Needs assist Patient Positioning: Upright in bed Baseline Vocal Quality: Normal Volitional Cough: Cognitively unable to elicit Volitional Swallow: Able to elicit    Oral/Motor/Sensory Function Overall Oral Motor/Sensory Function: Within functional limits   Ice Chips Ice  chips: Not tested   Thin Liquid  Thin Liquid: Impaired Presentation: Straw Pharyngeal  Phase Impairments: Cough - Delayed    Nectar Thick Nectar Thick Liquid: Not tested   Honey Thick Honey Thick Liquid: Not tested   Puree Puree: Within functional limits Presentation: Spoon   Solid     Solid: Within functional limits      Damien Blumenthal, M.A., CCC-SLP Speech Language Pathology, Acute Rehabilitation Services  Secure Chat preferred [phone]  02/06/2024,2:57 PM

## 2024-02-06 NOTE — Progress Notes (Addendum)
 Daily Progress Note Intern Pager: [phone]  Patient name: Patricia White Medical record number: [id number] Date of birth: 09-19-1970 Age: 53 y.o. Gender: female  Primary Care Provider: Alba Sharper, MD Consultants: Neurology (signed off) Code Status: DNR-I  Pt Overview and Major Events to Date:  6/24: Admitted; 6 seizures  Assessment and Plan: Patricia White is a 53 year old female with PMH of cerebral palsy, seizure disorder, HLD, and GERD presenting with multiple seizures in the setting of medication nonadherence.  Neurology was consulted, however appears that only 1/5 antiepileptics were being taken. Per neuro, she may be in status epilepticus at baseline regularly, no need for EEG.  Difficult social situation since her grandmother who is 37 years old is the one taking care of her.  History of APS reports in the past to ensure patient is receiving appropriate medicines to prevent breakthrough seizures. Assessment & Plan Seizure [facility]) Longstanding history of difficulty with medication adherence at home.  Patient was stable and during her last admission and at [facility].  Phenytoin  level <2.5. - Keppra  1500 mg BID - Klonopin  0.5 mg TID - Vimpat  200 mg BID - Perampanel  4 mg daily - Phenytoin  100 mg BID - May need to reopen APS case, patient may need guardianship to ensure she is receiving appropriate medications - TOC consult for medication adherence - PT/OT to treat - Fall and seizure precautions SIRS (systemic inflammatory response syndrome) (HCC) Met SIRS criteria on admission (fever 101, tachycardic 110, & tachypneic).  CXR negative for infection, UA negative, and RPP negative.  Source unknown at this time, fluid resuscitated, and will follow blood cultures obtained in the ED.  - Monitor fever curve - Continue CTX d/t neurological status for 48 hours, will discontinue abx if Bcx negative for 48h - Tylenol  as needed for fever, likely secondary to seizures Chronic health  problem HLD: Atorvastatin  40 mg GERD: Protonix  40 mg  FEN/GI: NPO pending bedside swallow, remove NGT PPx: Lovenox  Dispo:Pending clinical improvement in 2-3 days. Barriers include safe disposition home with medication adherence.   Subjective:  Patient resting comfortably in bed in NAD.  Answering all questions, quietly but seems coherent.  Is asking for food this morning.  Objective: Temp:  [97 F (36.1 C)-101 F (38.3 C)] 98.5 F (36.9 C) (06/25 0904) Pulse Rate:  [82-117] 94 (06/25 0930) Resp:  [13-31] 17 (06/25 0930) BP: (90-128)/(58-98) 115/89 (06/25 0930) SpO2:  [71 %-100 %] 100 % (06/25 0930) Weight:  [71.3 kg] 71.3 kg (06/24 2032) Physical Exam: General: Awake and Alert in NAD HEENT: NCAT. Sclera anicteric. No rhinorrhea. NGT in place. Cardiovascular: RRR. No M/R/G Respiratory: CTAB, normal WOB on RA. No wheezing, crackles, rhonchi, or diminished breath sounds. Abdomen: Soft, non-tender, non-distended. Bowel sounds normoactive Extremities: Weakness in extremities likely 2/2 CP.  No BLE edema, no deformities or significant joint findings. Skin: Warm and dry. No abrasions or rashes noted. Neuro: A&Ox2 (name, location). Chronic L sided weakness.  Laboratory: Most recent CBC Lab Results  Component Value Date   WBC 7.3 02/06/2024   HGB 9.5 (L) 02/06/2024   HCT 31.0 (L) 02/06/2024   MCV 91.4 02/06/2024   PLT 448 (H) 02/06/2024   Most recent BMP    Latest Ref Rng & Units 02/06/2024    5:15 AM  BMP  Glucose 70 - 99 mg/dL 98   BUN 6 - 20 mg/dL <5   Creatinine 9.55 - 1.00 mg/dL 9.18   Sodium 864 - 854 mmol/L 137   Potassium 3.5 -  5.1 mmol/L 3.4   Chloride 98 - 111 mmol/L 106   CO2 22 - 32 mmol/L 20   Calcium  8.9 - 10.3 mg/dL 8.2    Phenytoin  level: < 2.5  Imaging/Diagnostic Tests: No new imaging.  Janna Ferrier, DO 02/06/2024, 12:07 PM  PGY-1, [facility] Family Medicine FPTS Intern pager: [phone], text pages welcome Secure chat group [facility] Teaching Service

## 2024-02-06 NOTE — Progress Notes (Addendum)
 PHARMACY - PHYSICIAN COMMUNICATION CRITICAL VALUE ALERT - BLOOD CULTURE IDENTIFICATION (BCID)  Patricia White is an 53 y.o. female who presented to [facility] on 02/05/2024 with a chief complaint of seizures  Assessment:  82 YOF who presented with seizures and concern for non-adherence to seizure medications but also SIRS (fever of 101, tachycardia, tachypnea) without a clear source of infection. Now with 3 of 4 blood cultures showing GPC in clusters and 1 of 4 showing GNR with BCID detecting MRSE + Enterobacterales  Name of physician (or Provider) Contacted: FMTS Denita Melena)  Current antibiotics: Rocephin   Changes to prescribed antibiotics recommended:  Add Vancomycin - pharmacy to dose D/c Rocephin  and change to Zosyn to broaden coverage (team wants to avoid Cefepime given breakthrough seizures) Can narrow and/or adjust regimen as additional culture data becomes available  Results for orders placed or performed during the hospital encounter of 02/05/24  Blood Culture ID Panel (Reflexed) (Collected: 02/05/2024  8:40 PM)  Result Value Ref Range   Enterococcus faecalis NOT DETECTED NOT DETECTED   Enterococcus Faecium NOT DETECTED NOT DETECTED   Listeria monocytogenes NOT DETECTED NOT DETECTED   Staphylococcus species DETECTED (A) NOT DETECTED   Staphylococcus aureus (BCID) NOT DETECTED NOT DETECTED   Staphylococcus epidermidis DETECTED (A) NOT DETECTED   Staphylococcus lugdunensis NOT DETECTED NOT DETECTED   Streptococcus species NOT DETECTED NOT DETECTED   Streptococcus agalactiae NOT DETECTED NOT DETECTED   Streptococcus pneumoniae NOT DETECTED NOT DETECTED   Streptococcus pyogenes NOT DETECTED NOT DETECTED   A.calcoaceticus-baumannii NOT DETECTED NOT DETECTED   Bacteroides fragilis NOT DETECTED NOT DETECTED   Enterobacterales DETECTED (A) NOT DETECTED   Enterobacter cloacae complex NOT DETECTED NOT DETECTED   Escherichia coli NOT DETECTED NOT DETECTED   Klebsiella aerogenes  NOT DETECTED NOT DETECTED   Klebsiella oxytoca NOT DETECTED NOT DETECTED   Klebsiella pneumoniae NOT DETECTED NOT DETECTED   Proteus species NOT DETECTED NOT DETECTED   Salmonella species NOT DETECTED NOT DETECTED   Serratia marcescens NOT DETECTED NOT DETECTED   Haemophilus influenzae NOT DETECTED NOT DETECTED   Neisseria meningitidis NOT DETECTED NOT DETECTED   Pseudomonas aeruginosa NOT DETECTED NOT DETECTED   Stenotrophomonas maltophilia NOT DETECTED NOT DETECTED   Candida albicans NOT DETECTED NOT DETECTED   Candida auris NOT DETECTED NOT DETECTED   Candida glabrata NOT DETECTED NOT DETECTED   Candida krusei NOT DETECTED NOT DETECTED   Candida parapsilosis NOT DETECTED NOT DETECTED   Candida tropicalis NOT DETECTED NOT DETECTED   Cryptococcus neoformans/gattii NOT DETECTED NOT DETECTED   CTX-M ESBL NOT DETECTED NOT DETECTED   Carbapenem resistance IMP NOT DETECTED NOT DETECTED   Carbapenem resistance KPC NOT DETECTED NOT DETECTED   Methicillin resistance mecA/C DETECTED (A) NOT DETECTED   Carbapenem resistance NDM NOT DETECTED NOT DETECTED   Carbapenem resist OXA 48 LIKE NOT DETECTED NOT DETECTED   Carbapenem resistance VIM NOT DETECTED NOT DETECTED    Thank you for allowing pharmacy to be a part of this patient's care.  Almarie Lunger, PharmD, BCPS, BCIDP Infectious Diseases Clinical Pharmacist 02/06/2024 3:19 PM   **Pharmacist phone directory can now be found on [email or web address] ([email or web address]).  Listed under [facility].

## 2024-02-06 NOTE — ED Notes (Signed)
 Called Patricia White and let them know patient foming up

## 2024-02-07 DIAGNOSIS — R7881 Bacteremia: Secondary | ICD-10-CM

## 2024-02-07 DIAGNOSIS — R569 Unspecified convulsions: Secondary | ICD-10-CM | POA: Diagnosis not present

## 2024-02-07 LAB — LEVETIRACETAM LEVEL: Levetiracetam Lvl: 5.4 ug/mL — ABNORMAL LOW (ref 10.0–40.0)

## 2024-02-07 LAB — BASIC METABOLIC PANEL WITH GFR
Anion gap: 6 (ref 5–15)
BUN: 5 mg/dL — ABNORMAL LOW (ref 6–20)
CO2: 27 mmol/L (ref 22–32)
Calcium: 8.7 mg/dL — ABNORMAL LOW (ref 8.9–10.3)
Chloride: 102 mmol/L (ref 98–111)
Creatinine, Ser: 0.8 mg/dL (ref 0.44–1.00)
GFR, Estimated: >60 mL/min (ref >60)
Glucose, Bld: 136 mg/dL — ABNORMAL HIGH (ref 70–99)
Potassium: 3.4 mmol/L — ABNORMAL LOW (ref 3.5–5.1)
Sodium: 135 mmol/L (ref 135–145)

## 2024-02-07 LAB — CBC
HCT: 30.2 % — ABNORMAL LOW (ref 36.0–46.0)
Hemoglobin: 9.5 g/dL — ABNORMAL LOW (ref 12.0–15.0)
MCH: 28 pg (ref 26.0–34.0)
MCHC: 31.5 g/dL (ref 30.0–36.0)
MCV: 89.1 fL (ref 80.0–100.0)
Platelets: 486 10*3/uL — ABNORMAL HIGH (ref 150–400)
RBC: 3.39 MIL/uL — ABNORMAL LOW (ref 3.87–5.11)
RDW: 14.6 % (ref 11.5–15.5)
WBC: 7.3 10*3/uL (ref 4.0–10.5)
nRBC: 0 % (ref 0.0–0.2)

## 2024-02-07 LAB — RESPIRATORY PANEL BY PCR

## 2024-02-07 MED ORDER — PERAMPANEL 2 MG PO TABS
4.0000 mg | ORAL_TABLET | Freq: Every day | ORAL | Status: DC
  Administered 2024-02-07 – 2024-02-19 (×13): 4 mg via ORAL
  Filled 2024-02-07 (×13): qty 2

## 2024-02-07 MED ORDER — POTASSIUM CHLORIDE 20 MEQ PO PACK
40.0000 meq | PACK | Freq: Once | ORAL | Status: AC
  Administered 2024-02-07: 40 meq via ORAL
  Filled 2024-02-07: qty 2

## 2024-02-07 MED ORDER — POTASSIUM CHLORIDE 20 MEQ PO PACK: 40.0000 meq | PACK | Freq: Once | ORAL | Status: DC

## 2024-02-07 MED ORDER — LACOSAMIDE 200 MG PO TABS
200.0000 mg | ORAL_TABLET | Freq: Two times a day (BID) | ORAL | Status: DC
  Administered 2024-02-07 – 2024-02-20 (×27): 200 mg via ORAL
  Filled 2024-02-07 (×27): qty 1

## 2024-02-07 MED ORDER — PHENYTOIN 50 MG PO CHEW
100.0000 mg | CHEWABLE_TABLET | Freq: Two times a day (BID) | ORAL | Status: DC
  Administered 2024-02-07 – 2024-02-11 (×8): 100 mg via ORAL
  Filled 2024-02-07 (×8): qty 2

## 2024-02-07 MED ORDER — CLONAZEPAM 0.5 MG PO TBDP
0.5000 mg | ORAL_TABLET | Freq: Three times a day (TID) | ORAL | Status: DC
  Administered 2024-02-07 – 2024-02-20 (×41): 0.5 mg via ORAL
  Filled 2024-02-07 (×42): qty 1

## 2024-02-07 NOTE — Evaluation (Addendum)
 Occupational Therapy Evaluation Patient Details Name: Patricia White MRN: [id number] DOB: 11/18/1970 Today's Date: 02/07/2024   History of Present Illness   53 y.o. female presents to [facility] 02/05/24 due to breakthrough seizure 2/2 medication non-adherence. Pt also with SIRS. Prior admit 5/15 due to seizure. 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     Clinical Impressions Pt admitted for above, her PLOF is unknown but she did report working on getting OOB while she was at rehab. Pt currently presenting with listed deficits, needing total A +2 for  all mobility and minimum of total A for ADLs. During session pt was obtunded and needed consistent stimulation to maintain arousal, she was not engaging in any ADLs. OT will reassess pt's ability to engage in therapy next session, we will follow her in acute stay to progress her as able. Patient will benefit from continued inpatient follow up therapy, <3 hours/day. No family present to discuss POC with.      If plan is discharge home, recommend the following:   Two people to help with walking and/or transfers;A lot of help with bathing/dressing/bathroom;Assistance with cooking/housework;Direct supervision/assist for medications management;Direct supervision/assist for financial management;Assist for transportation;Supervision due to cognitive status;Help with stairs or ramp for entrance;A lot of help with walking and/or transfers;Two people to help with bathing/dressing/bathroom     Functional Status Assessment   Patient has had a recent decline in their functional status and demonstrates the ability to make significant improvements in function in a reasonable and predictable amount of time.     Equipment Recommendations    (defer to next level of care)     Recommendations for Other Services   Speech consult     Precautions/Restrictions   Precautions Precautions: Fall Recall of Precautions/Restrictions:  Impaired Precaution/Restrictions Comments: seizure precs, L hemiparesis (congenital CP) Required Braces or Orthoses: Other Brace Other Brace: LLE AFO Restrictions Weight Bearing Restrictions Per Provider Order: No     Mobility Bed Mobility Overal bed mobility: Needs Assistance Bed Mobility: Supine to Sit, Sit to Supine     Supine to sit: Total assist, +2 for physical assistance, +2 for safety/equipment Sit to supine: Total assist, +2 for physical assistance, +2 for safety/equipment   General bed mobility comments: TotalAx2 with use of bed pad and helicopter method    Transfers                   General transfer comment: deferred      Balance Overall balance assessment: Needs assistance, Mild deficits observed, not formally tested Sitting-balance support: No upper extremity supported, Feet supported Sitting balance-Leahy Scale: Zero Sitting balance - Comments: TotalA for posterior lean, unable to correct balance Postural control: Posterior lean                                 ADL either performed or assessed with clinical judgement   ADL                                         General ADL Comments: Based on IE assessment, pt needing total A for all ADLs including feeding. Pt without any initiation for any attempts to groom with hand over hand assist of her RUE.     Vision   Additional Comments: not able to assess, pt with forward cervical posture and needing  assist to maintain head position.     Perception         Praxis         Pertinent Vitals/Pain Pain Assessment Pain Assessment: Faces Faces Pain Scale: Hurts little more Pain Location: L abdomen and mobility of BLEs to EOB Pain Descriptors / Indicators: Discomfort, Guarding, Grimacing Pain Intervention(s): Monitored during session, Limited activity within patient's tolerance     Extremity/Trunk Assessment Upper Extremity Assessment Upper Extremity Assessment:  Difficult to assess due to impaired cognition RUE Deficits / Details: Spontaneous use of RUE to reposition, LUE Deficits / Details: Congential CP, L hemi, painful LUE, pt not performing any use of LUE, could be secondary to cog.   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   Communication Communication Communication: Impaired Factors Affecting Communication: Reduced clarity of speech;Difficulty expressing self   Cognition Arousal: Obtunded Behavior During Therapy: Flat affect               OT - Cognition Comments: pt with hx of developtemental disabilities at baseline, in/out of sleep throughout session and needing consistent stimulation to engage. Not following commands                 Following commands: Impaired Following commands impaired: Follows one step commands inconsistently, Follows one step commands with increased time     Cueing  General Comments   Cueing Techniques: Verbal cues;Gestural cues;Tactile cues;Visual cues      Exercises Other Exercises Other Exercises: Cervical extension.   Shoulder Instructions      Home Living Family/patient expects to be discharged to:: Private residence Living Arrangements: Other relatives Available Help at Discharge: Available 24 hours/day;Family Type of Home: House Home Access: Stairs to enter [employer] of Steps: 4 Entrance Stairs-Rails: None Home Layout: One level     Bathroom Shower/Tub: Tub/shower unit;Sponge bathes at baseline   [employer]: Standard     Home Equipment: TEFL teacher Comments: home setup obtained from chart review- pt mostly non verbal      Prior Functioning/Environment Prior Level of Function :  History of Falls (last six months)             Mobility Comments: Unclear PLOF, reports working with staff at SNF on getting out of bed ADLs Comments: Unclear PLOF    OT Problem List: Impaired balance (sitting and/or standing);Decreased activity tolerance;Impaired UE functional use;Pain;Decreased cognition;Decreased strength   OT Treatment/Interventions: Self-care/ADL training;Balance training;Therapeutic exercise;Therapeutic activities;Patient/family education      OT Goals(Current goals can be found in the care plan section)   Acute Rehab OT Goals OT Goal Formulation: Patient unable to participate in goal setting Time For Goal Achievement: 02/21/24 Potential to Achieve Goals: Fair ADL Goals Pt Will Perform Grooming: sitting;with min assist Pt Will Perform Upper Body Bathing: sitting;with mod assist Pt Will Perform Upper Body Dressing: sitting;with min assist Pt Will Perform Lower Body Dressing: sit to/from stand;with mod assist Additional ADL Goal #1: Pt will complete bed mobility with Min A in prep for ADLs.   OT Frequency:  Min 1X/week    Co-evaluation   Reason for Co-Treatment: For patient/therapist safety;To address functional/ADL transfers;Necessary to address cognition/behavior during functional activity PT goals addressed during session: Mobility/safety with mobility;Balance OT goals addressed during session: ADL's and self-care;Strengthening/ROM  AM-PAC OT 6 Clicks Daily Activity     Outcome Measure Help from another person eating meals?: Total Help from another person taking care of personal grooming?: Total Help from another person toileting, which includes using toliet, bedpan, or urinal?: Total Help from another person bathing (including washing, rinsing, drying)?: Total Help from another person to put on and taking off regular upper body clothing?: Total Help from another person to put on and taking off regular lower body clothing?: Total 6 Click  Score: 6   End of Session    Activity Tolerance: Patient limited by fatigue Patient left: in bed;with call bell/phone within reach;with bed alarm set  OT Visit Diagnosis: Unsteadiness on feet (R26.81);Other abnormalities of gait and mobility (R26.89);Pain;Muscle weakness (generalized) (M62.81) Pain - Right/Left:  (bilat) Pain - part of body: Leg (abdominal)                Time: 8963-8941 OT Time Calculation (min): 22 min Charges:  OT General Charges $OT Visit: 1 Visit  02/07/2024  AB, OTR/L  Acute Rehabilitation Services  Office: [phone]   Patricia White 02/07/2024, 11:45 AM

## 2024-02-07 NOTE — Assessment & Plan Note (Addendum)
 Met SIRS criteria on admission (fever 101, tachycardic 110, & tachypneic).  CXR negative for infection, UA negative, and RPP negative.  Source unknown at this time, vitals likely secondary to seizures.  Blood cx revealed GPC (Staph  epi, mec A/C resistance) and GNR (Enterobacterales). - Monitor fever curve; fevered ON to 100.7 (oral) - Abx: Vanc and Zosyn; will de-escalate antibiotics as able once sensitivities and speciation further determined - If fevers continue ON w/ concern for bacteremia, CT chest & CT abdomen pelvis overnight; will consider LP in AM determine possible source vs meningitis - Will need to perform a back and glute exam to rule out wounds as source - Respiratory 20 pathogen PCR - Tylenol  as needed for fever, likely secondary to seizures - If imaging and respiratory panel negative, can reconsider re-consulting neurology for further recommendations

## 2024-02-07 NOTE — TOC CM/SW Note (Signed)
 Transition of Care [facility]) - Inpatient Brief Assessment   Patient Details  Name: Patricia White MRN: [id number] Date of Birth: 1970-11-25  Transition of Care [facility]) CM/SW Contact:    Almarie CHRISTELLA Goodie, LCSW Phone Number: 02/07/2024, 7:53 PM   Clinical Narrative:    Patient known to CSW from multiple previous admissions, from home with grandmother who is her legal guardian and an aunt, recently admitted to [facility] on last admission. Per [employer], patient was ambulatory when she discharged home on Monday, and they provided prescriptions to the patient's family to fill all of her medications, including seizure medications. CSW attempted to reach patient's family to address medication compliance and recommendation for SNF, there was no answer and no way to leave a voicemail. CSW sent a text message to patient's aunt, [name], awaiting response.    Transition of Care Asessment: Insurance and Status: Insurance coverage has been reviewed Patient has primary care physician: Yes Home environment has been reviewed: Home with grandmother and aunt Prior level of function:: Needs assist Prior/Current Home Services: Current home services Social Drivers of Health Review: SDOH reviewed no interventions necessary Readmission risk has been reviewed: Yes Transition of care needs: transition of care needs identified, TOC will continue to follow

## 2024-02-07 NOTE — Plan of Care (Signed)
 FMTS Brief Progress Note  S: Went to bedside to evaluate patient for night rounds.  Patient sitting up in bed, awake and alert, asking for more soda.  RN reports that patient appears better compared to last night, is more willing to take her medications tonight.   O: BP 120/82 (BP Location: Left Arm)   Pulse (!) 111   Temp 97.8 F (36.6 C)   Resp 18   Ht 5' 5 (1.651 m)   Wt 71 kg   SpO2 100%   BMI 26.05 kg/m   General: Sitting up in bed, awake and alert, in no acute distress CV: RRR, normal S1/S2, no M/R/G Pulm: CTAB, normal WOB on RA  A/P: Seizures --Patient has taken all p.m. antiepileptics appropriately --CTM with seizure precautions  SIRS Infectious source remains unknown.  Blood cultures with multiple organisms (E faecalis, strep, PsA, CoNS). Discussion with ID pharmacist per day team concluded that vancomycin and Zosyn provide sufficient coverage for these pathogens.  New set of blood cultures drawn.  Patient with tachycardia (110s-120s) but remains afebrile.  Concern for endocarditis, though no murmur auscultated on exam. --EKG  --Will consider echocardiogram, cardiology consult pending EKG result, ongoing tachycardia --Follow-up repeat blood cultures --Continue vancomycin, Zosyn -- Orders reviewed. Labs for AM ordered, which was adjusted as needed.  -- If condition changes, will order CT chest, abdomen, pelvis to investigate source of infection.  Will also consider LP in AM.  Adele Song, MD 02/07/2024, 9:50 PM PGY-1, [facility] Family Medicine Night Resident  Please page [phone] with questions.

## 2024-02-07 NOTE — Assessment & Plan Note (Signed)
 HLD: Atorvastatin  40 mg GERD: Protonix  40 mg

## 2024-02-07 NOTE — Progress Notes (Addendum)
 Daily Progress Note Intern Pager: [phone]  Patient name: Patricia White Medical record number: [id number] Date of birth: October 26, 1970 Age: 53 y.o. Gender: female  Primary Care Provider: Alba Sharper, MD Consultants: Neurology (signed off) Code Status: DNR-I  Pt Overview and Major Events to Date:  6/24: Admitted; 6 seizures  Assessment and Plan: Patricia White is a 53 y.o. female with PMH of cerebral palsy, seizure disorder, HLD, and GERD presenting with multiple seizures in the setting of medication on adherence.  Neurology was consulted, since patient was only taking 1/5 antiepileptics, they recommended continuing prescribed antiepileptics as prescribed, no EEG recommended since she may be in status epilepticus at baseline regularly due to extensive workup prior and signed off.   Difficult social situation since her grandmother is 62 years old and is taking care of her.  History of APS reports in the past to ensure patient is receiving appropriate medications to prevent breakthrough seizures. Assessment & Plan Seizure (HCC) No seizures reported overnight.  Previous history of difficulty with medication adherence at home, with no breakthrough seizures during admissions or at SNFs. - Keppra  1500 mg BID - Klonopin  0.5 mg TID - Vimpat  200 mg BID - Perampanel  4 mg daily - Phenytoin  100 mg BID - Will discuss case with APS today, patient may need guardianship to ensure she is receiving appropriate medications - TOC consult for medication adherence - PT/OT to treat - Fall and seizure precautions - AM CBC/BMP/Mag SIRS (systemic inflammatory response syndrome) (HCC) Met SIRS criteria on admission (fever 101, tachycardic 110, & tachypneic).  CXR negative for infection, UA negative, and RPP negative.  Source unknown at this time, vitals likely secondary to seizures.  Blood cx revealed GPC (Staph  epi, mec A/C resistance) and GNR (Enterobacterales). - Monitor fever curve; fevered ON  to 100.7 (oral) - Abx: Vanc and Zosyn; will de-escalate antibiotics as able once sensitivities and speciation further determined - If fevers continue ON w/ concern for bacteremia, CT chest & CT abdomen pelvis overnight; will consider LP in AM determine possible source vs meningitis - Will need to perform a back and glute exam to rule out wounds as source - Respiratory 20 pathogen PCR - Tylenol  as needed for fever, likely secondary to seizures - If imaging and respiratory panel negative, can reconsider re-consulting neurology for further recommendations Chronic health problem HLD: Atorvastatin  40 mg GERD: Protonix  40 mg  FEN/GI: Dysphagia 3 Diet PPx: Lovenox  Dispo: Pending clinical improvement in 2 to 3 days.  Barriers include safe disposition home with medication adherence.  Subjective:  Nursing reported that patient was declining her antiepileptic medicines by mouth yesterday earlier in the shift.  However after some redirection and retiming patient took prescribed medications.  Difficult to communicate with patient this morning, but she was responsive and answered with yes and no to some questions.  Objective: Temp:  [98.3 F (36.8 C)-100 F (37.8 C)] 98.5 F (36.9 C) (06/26 1131) Pulse Rate:  [94-115] 104 (06/26 1131) Resp:  [18-24] 18 (06/26 1131) BP: (106-120)/(66-103) 120/87 (06/26 1131) SpO2:  [97 %-100 %] 100 % (06/26 1131) Weight:  [71 kg] 71 kg (06/25 1532) Physical Exam: General: Awake and Alert in NAD HEENT: NCAT. Sclera anicteric. No rhinorrhea. Cardiovascular: RRR. No M/R/G Respiratory: CTAB, normal WOB on RA. No wheezing, crackles, rhonchi, or diminished breath sounds. Abdomen: Soft, non-tender, non-distended. Bowel sounds normoactive Extremities: Chronic L sided weakness.  No BLE edema, no deformities or significant joint findings. Skin: Warm and dry.  Healing wound w/ scab noted on the L antecubital region. Neuro: A&Ox1 (name, birthday). No focal neurological  deficits.  Laboratory: Most recent CBC Lab Results  Component Value Date   WBC 7.3 02/07/2024   HGB 9.5 (L) 02/07/2024   HCT 30.2 (L) 02/07/2024   MCV 89.1 02/07/2024   PLT 486 (H) 02/07/2024   Most recent BMP    Latest Ref Rng & Units 02/07/2024    4:33 AM  BMP  Glucose 70 - 99 mg/dL 863   BUN 6 - 20 mg/dL 5   Creatinine 9.55 - 8.99 mg/dL 9.19   Sodium 864 - 854 mmol/L 135   Potassium 3.5 - 5.1 mmol/L 3.4   Chloride 98 - 111 mmol/L 102   CO2 22 - 32 mmol/L 27   Calcium  8.9 - 10.3 mg/dL 8.7     Imaging/Diagnostic Tests: No new imaging.  Patricia Ferrier, DO 02/07/2024, 1:13 PM  PGY-1, [facility] Family Medicine FPTS Intern pager: [phone], text pages welcome Secure chat group [facility] Teaching Service

## 2024-02-07 NOTE — Assessment & Plan Note (Addendum)
 No seizures reported overnight.  Previous history of difficulty with medication adherence at home, with no breakthrough seizures during admissions or at SNFs. - Keppra  1500 mg BID - Klonopin  0.5 mg TID - Vimpat  200 mg BID - Perampanel  4 mg daily - Phenytoin  100 mg BID - Will discuss case with APS today, patient may need guardianship to ensure she is receiving appropriate medications - TOC consult for medication adherence - PT/OT to treat - Fall and seizure precautions - AM CBC/BMP/Mag

## 2024-02-07 NOTE — Progress Notes (Signed)
 PHARMACY - PHYSICIAN COMMUNICATION CRITICAL VALUE ALERT - BLOOD CULTURE UPDATE  Micro called with a blood culture update - verbal via phone - also growing aerococcus, E faecalis, strep species, CoNS, and Pseudomonas.   Seems like gross contamination and hard to tell what is real vs contamination - will need further evaluation as cultures are reported out and more data becomes available.   FMTS made aware of updates and ID service auto-consulted for evaluation with E faecalis isolated in blood cultures.  Would recommend to hold the course with Vancomycin + Zosyn for now pending further evaluation.   Thank you for allowing pharmacy to be a part of this patient's care.  Almarie Lunger, PharmD, BCPS, BCIDP Infectious Diseases Clinical Pharmacist 02/07/2024 3:27 PM   **Pharmacist phone directory can now be found on [email or web address] ([email or web address]).  Listed under [facility].

## 2024-02-07 NOTE — Evaluation (Signed)
 Physical Therapy Evaluation Patient Details Name: Patricia White MRN: [id number] DOB: 08-05-1971 Today's Date: 02/07/2024  History of Present Illness  53 y.o. female presents to [facility] 02/05/24 due to breakthrough seizure 2/2 medication non-adherence. Pt also with SIRS. Prior admit 5/15 due to seizure. 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  Clinical Impression  Limited PT eval as pt was obtunded and needed constant stimulation to maintain alertness. Pt reported working on getting OOB with staff at rehab prior to admit. Pt was TotalAx2 for all bed mobility with TotalA needed to maintain balance at EOB. Spontaneous movement noted in B LE, however, no movement to commands. Will re-assess in following sessions to determine pt's ability to engage with therapy. Will continue to follow acutely to help pt progress mobility as able. Recommending <3hrs post acute to decrease caregiver burden and improve mobility.       If plan is discharge home, recommend the following: Help with stairs or ramp for entrance;A lot of help with walking and/or transfers;Supervision due to cognitive status;Assistance with cooking/housework;Assist for transportation   Can travel by private vehicle   No    Equipment Recommendations Wheelchair (measurements PT);Wheelchair cushion (measurements PT)     Functional Status Assessment Patient has had a recent decline in their functional status and demonstrates the ability to make significant improvements in function in a reasonable and predictable amount of time.     Precautions / Restrictions Precautions Precautions: Fall Recall of Precautions/Restrictions: Impaired Precaution/Restrictions Comments: seizure precs, L hemiparesis (congenital CP) Required Braces or Orthoses: Other Brace Other Brace: LLE AFO Restrictions Weight Bearing Restrictions Per Provider Order: No      Mobility  Bed Mobility Overal bed mobility: Needs Assistance Bed Mobility: Supine to  Sit, Sit to Supine     Supine to sit: Total assist, +2 for physical assistance, +2 for safety/equipment Sit to supine: Total assist, +2 for physical assistance, +2 for safety/equipment   General bed mobility comments: TotalAx2 with use of bed pad and helicopter method       Balance Overall balance assessment: Needs assistance, Mild deficits observed, not formally tested Sitting-balance support: No upper extremity supported, Feet supported Sitting balance-Leahy Scale: Zero Sitting balance - Comments: TotalA for posterior lean, unable to correct balance Postural control: Posterior lean         Pertinent Vitals/Pain Pain Assessment Pain Assessment: Faces Faces Pain Scale: Hurts even more Pain Location: L abdomen Pain Descriptors / Indicators: Discomfort Pain Intervention(s): Limited activity within patient's tolerance, Monitored during session, Repositioned    Home Living Family/patient expects to be discharged to:: Private residence Living Arrangements: Other relatives Available Help at Discharge: Available 24 hours/day;Family Type of Home: House Home Access: Stairs to enter Entrance Stairs-Rails: None Entrance Stairs-Number of Steps: 4   Home Layout: One level Home Equipment: Environmental education officer Comments: home setup obtained from chart review    Prior Function Prior Level of Function : History of Falls (last six months)    Mobility Comments: Unclear PLOF, reports working with staff at SNF on getting out of bed ADLs Comments: Unclear PLOF     Extremity/Trunk Assessment   Upper Extremity Assessment Upper Extremity Assessment: Defer to OT evaluation    Lower Extremity Assessment Lower Extremity Assessment: Difficult to assess due to impaired cognition LLE Deficits / Details: 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    Cervical / Trunk Assessment Cervical / Trunk Assessment: Normal  Communication   Communication Communication: Impaired Factors  Affecting Communication: Reduced clarity of speech;Difficulty  expressing self    Cognition Arousal: Lethargic Behavior During Therapy: Flat affect   PT - Cognitive impairments: History of cognitive impairments, Sequencing, Problem solving, Safety/Judgement, No family/caregiver present to determine baseline, Difficult to assess, Initiation, Awareness, Memory, Attention Difficult to assess due to: Impaired communication    PT - Cognition Comments: History of intellectual disability at baseline. Following commands: Impaired Following commands impaired: Follows one step commands inconsistently, Follows one step commands with increased time     Cueing Cueing Techniques: Verbal cues, Gestural cues, Tactile cues, Visual cues      PT Assessment Patient needs continued PT services  PT Problem List Decreased strength;Decreased range of motion;Decreased activity tolerance;Decreased balance;Decreased mobility;Decreased cognition       PT Treatment Interventions DME instruction;Gait training;Functional mobility training;Stair training;Therapeutic activities;Therapeutic exercise;Balance training;Cognitive remediation;Patient/family education    PT Goals (Current goals can be found in the Care Plan section)  Acute Rehab PT Goals Patient Stated Goal: unable to state goal PT Goal Formulation: Patient unable to participate in goal setting Time For Goal Achievement: 02/21/24 Potential to Achieve Goals: Fair    Frequency Min 2X/week     Co-evaluation   Reason for Co-Treatment: For patient/therapist safety;To address functional/ADL transfers;Necessary to address cognition/behavior during functional activity PT goals addressed during session: Mobility/safety with mobility;Balance         AM-PAC PT 6 Clicks Mobility  Outcome Measure Help needed turning from your back to your side while in a flat bed without using bedrails?: Total Help needed moving from lying on your back to sitting on the  side of a flat bed without using bedrails?: Total Help needed moving to and from a bed to a chair (including a wheelchair)?: Total Help needed standing up from a chair using your arms (e.g., wheelchair or bedside chair)?: Total Help needed to walk in hospital room?: Total Help needed climbing 3-5 steps with a railing? : Total 6 Click Score: 6    End of Session   Activity Tolerance: Patient limited by fatigue;Patient limited by lethargy Patient left: in bed;with call bell/phone within reach;with bed alarm set Nurse Communication: Mobility status PT Visit Diagnosis: Other abnormalities of gait and mobility (R26.89);Hemiplegia and hemiparesis;Muscle weakness (generalized) (M62.81) Hemiplegia - Right/Left: Left Hemiplegia - dominant/non-dominant: Non-dominant Hemiplegia - caused by: Unspecified    Time: 8963-8941 PT Time Calculation (min) (ACUTE ONLY): 22 min   Charges:   PT Evaluation $PT Eval Low Complexity: 1 Low   PT General Charges $$ ACUTE PT VISIT: 1 Visit       Kate ORN, PT, DPT Secure Chat Preferred  Rehab Office [phone]   Kate BRAVO Wendolyn 02/07/2024, 11:19 AM

## 2024-02-07 NOTE — Progress Notes (Signed)
 Speech Language Pathology Treatment: Dysphagia  Patient Details Name: Patricia White MRN: [id number] DOB: [birth year] Today's Date: 02/07/2024 Time: 8898-8887 SLP Time Calculation (min) (ACUTE ONLY): 11 min  Assessment / Plan / Recommendation Clinical Impression  Pt declined to try solids today but was observed with sips of thin liquids via straw. The initial sip resulted in immediate coughing but suspect pt's positioning affected performance as she was leaning to the R. When repositioned and maintaining a head posture closer to midline, coughing did not recur with multiple subsequent trials. Note pt's diet was changed to Dys 3 by MD previous date so will leave current order in place. Suspect pt can upgrade to regular solids per MD discretion given pt's thorough mastication and complete oral clearance observed during initial evaluation. Ongoing SLP f/u is not necessary, will sign off at this time.    HPI HPI: Patricia White is a 53 yo female with PMH of cerebral palsy, seizure disorder, HLD, and GERD presenting to ED 6/24 with multiple seizures in the setting of medication nonadherance. Meets criteria for SIRS with unknown source. CXR negative. Seen by SLP 11/03/23 with recommendations to continue regular diet with thin liquids.      SLP Plan  All goals met          Recommendations  Diet recommendations: Dysphagia 3 (mechanical soft);Thin liquid Liquids provided via: Cup;Straw Medication Administration: Whole meds with liquid Supervision: Staff to assist with self feeding;Full supervision/cueing for compensatory strategies Compensations: Minimize environmental distractions;Slow rate;Small sips/bites Postural Changes and/or Swallow Maneuvers: Seated upright 90 degrees;Upright 30-60 min after meal                  Oral care BID   Frequent or constant Supervision/Assistance Dysphagia, unspecified (R13.10)     All goals met     Damien Blumenthal, M.A., CCC-SLP Speech Language  Pathology, Acute Rehabilitation Services  Secure Chat preferred [phone]   02/07/2024, 11:20 AM

## 2024-02-07 NOTE — Plan of Care (Signed)
 Went to patient bedside to confirm with RN whether or not patient had taken her antiepileptic medications by mouth as patient had refused her evening medications earlier in the shift.  RN verbally confirmed that patient had taken medications by mouth.  Will CTM patient's neurologic status and response to home AEDs.

## 2024-02-08 DIAGNOSIS — B952 Enterococcus as the cause of diseases classified elsewhere: Secondary | ICD-10-CM | POA: Diagnosis not present

## 2024-02-08 DIAGNOSIS — B9689 Other specified bacterial agents as the cause of diseases classified elsewhere: Secondary | ICD-10-CM | POA: Diagnosis not present

## 2024-02-08 DIAGNOSIS — B957 Other staphylococcus as the cause of diseases classified elsewhere: Secondary | ICD-10-CM

## 2024-02-08 DIAGNOSIS — R569 Unspecified convulsions: Secondary | ICD-10-CM | POA: Diagnosis not present

## 2024-02-08 DIAGNOSIS — R7881 Bacteremia: Secondary | ICD-10-CM | POA: Diagnosis not present

## 2024-02-08 LAB — CBC
HCT: 34.9 % — ABNORMAL LOW (ref 36.0–46.0)
Hemoglobin: 10.8 g/dL — ABNORMAL LOW (ref 12.0–15.0)
MCH: 28 pg (ref 26.0–34.0)
MCHC: 30.9 g/dL (ref 30.0–36.0)
MCV: 90.4 fL (ref 80.0–100.0)
Platelets: 455 10*3/uL — ABNORMAL HIGH (ref 150–400)
RBC: 3.86 MIL/uL — ABNORMAL LOW (ref 3.87–5.11)
RDW: 14.8 % (ref 11.5–15.5)
WBC: 7.6 10*3/uL (ref 4.0–10.5)
nRBC: 0 % (ref 0.0–0.2)

## 2024-02-08 LAB — BASIC METABOLIC PANEL WITH GFR
Anion gap: 13 (ref 5–15)
BUN: 5 mg/dL — ABNORMAL LOW (ref 6–20)
CO2: 24 mmol/L (ref 22–32)
Calcium: 8.9 mg/dL (ref 8.9–10.3)
Chloride: 100 mmol/L (ref 98–111)
Creatinine, Ser: 0.77 mg/dL (ref 0.44–1.00)
GFR, Estimated: >60 mL/min (ref >60)
Glucose, Bld: 109 mg/dL — ABNORMAL HIGH (ref 70–99)
Potassium: 4.3 mmol/L (ref 3.5–5.1)
Sodium: 137 mmol/L (ref 135–145)

## 2024-02-08 LAB — MAGNESIUM: Magnesium: 1.6 mg/dL — ABNORMAL LOW (ref 1.7–2.4)

## 2024-02-08 MED ORDER — MAGNESIUM SULFATE 2 GM/50ML IV SOLN
2.0000 g | Freq: Once | INTRAVENOUS | Status: AC
  Administered 2024-02-08: 2 g via INTRAVENOUS
  Filled 2024-02-08: qty 50

## 2024-02-08 NOTE — Assessment & Plan Note (Signed)
 No seizures reported overnight.  Previous history of difficulty with medication adherence at home, with no breakthrough seizures during admissions or at SNFs.  - Keppra  1500 mg BID - Klonopin  0.5 mg TID - Vimpat  200 mg BID - Perampanel  4 mg daily - Phenytoin  100 mg BID - APS 6/26, awaiting guardianship - TOC consult for medication adherence - PT/OT to treat - Fall and seizure precautions - AM CBC/BMP/Mag; repleted K on 6/26 and Mag this AM

## 2024-02-08 NOTE — Care Management Important Message (Signed)
 Important Message  Patient Details  Name: Patricia White MRN: [id number] Date of Birth: January 29, 1971   Important Message Given:  Yes - Medicare IM     Claretta Deed 02/08/2024, 2:38 PM

## 2024-02-08 NOTE — Assessment & Plan Note (Addendum)
 Met SIRS criteria on admission.  Imaging and workup negative so far.  However blood cultures grew multiple organisms, likely a contaminant d/t numerous organisms w/o consistent symptoms.  ID pharmacy concluded that Vanco and Zosyn  would provide sufficient coverage. No wounds noted on exam. - Continue Vanc and Zosyn  (6/25 - ) - Could consider CT chest and CT abd/pelvis to determine source or LP for meningitis if imaging is nevgative - Tylenol  prn for fever, likely secondary to seizures

## 2024-02-08 NOTE — Assessment & Plan Note (Addendum)
 No seizures reported overnight.  Previous history of difficulty with medication adherence at home, with no breakthrough seizures during admissions or at SNFs.  - Keppra  1500 mg BID - Klonopin  0.5 mg TID - Vimpat  200 mg BID - Perampanel  4 mg daily - Phenytoin  100 mg BID - APS 6/26, awaiting guardianship - TOC consult for medication adherence - PT/OT to treat - Fall and seizure precautions - AM CBC/BMP/Mag; repleted K on 6/26 and Mag this AM

## 2024-02-08 NOTE — Progress Notes (Signed)
 Daily Progress Note Intern Pager: [phone]  Patient name: Patricia White Medical record number: [id number] Date of birth: Feb 20, 1971 Age: 53 y.o. Gender: female  Primary Care Provider: Alba Sharper, MD Consultants: Neurology (signed off) Code Status: DNR-I  Pt Overview and Major Events to Date:  6/24: Admitted; 6 seizures  Assessment and Plan: DANETT PALAZZO is a 53 y.o. female with PMH of cerebral palsy, seizure disorder, HLD, and GERD presenting with multiple seizures in the setting of medication nonadherence.  Neurology was consulted, since patient was only taking 1/5 antiepileptics they recommended continuing  outpatient medications and recommended no EEG since she may be in status epilepticus at baseline regularly due to extensive workup prior and signed off.  Difficult social situation since her grandmother is 13 year old and is taking care of her.  History of APS reports in the past to ensure patient is receiving appropriate medications to prevent breakthrough seizures.  Report filed on 6/26 with APS and emergency petition for guardianship initiated, may have an update M/Tu. Assessment & Plan Seizure (HCC) No seizures reported overnight.  Previous history of difficulty with medication adherence at home, with no breakthrough seizures during admissions or at SNFs.  - Keppra  1500 mg BID - Klonopin  0.5 mg TID - Vimpat  200 mg BID - Perampanel  4 mg daily - Phenytoin  100 mg BID - APS 6/26, awaiting guardianship - TOC consult for medication adherence - PT/OT to treat - Fall and seizure precautions - AM CBC/BMP/Mag; repleted K on 6/26 and Mag this AM Bacteremia Met SIRS criteria on admission.  Imaging and workup negative so far.  However blood cultures grew multiple organisms, likely a contaminant d/t numerous organisms w/o consistent symptoms.  ID pharmacy concluded that Vanco and Zosyn  would provide sufficient coverage. No wounds noted on exam. - Continue Vanc and Zosyn   (6/25 - ) - Could consider CT chest and CT abd/pelvis to determine source or LP for meningitis if imaging is nevgative - Tylenol  prn for fever, likely secondary to seizures Chronic health problem HLD: Atorvastatin  40 mg GERD: Protonix  40 mg  FEN/GI: Dysphagia 3 diet PPx: Lovenox  Dispo: Pending clinical improvement  Subjective:  Patient is doing better this morning. Was able to respond more coherently to my questions. Seemed tired appearing and wasn't hungry.   Objective: Temp:  [97.6 F (36.4 C)-99.3 F (37.4 C)] 97.6 F (36.4 C) (06/27 1248) Pulse Rate:  [88-113] 105 (06/27 1248) Resp:  [18] 18 (06/27 1248) BP: (103-136)/(60-98) 118/80 (06/27 1248) SpO2:  [97 %-100 %] 98 % (06/27 1248) Physical Exam: General: Awake and Alert in NAD HEENT: NCAT. Sclera anicteric. No rhinorrhea. Cardiovascular: RRR. No M/R/G Respiratory: CTAB, normal WOB on RA. No wheezing, crackles, rhonchi, or diminished breath sounds. Abdomen: Soft, non-tender, non-distended. Bowel sounds normoactive Extremities: Chronic L sided weakness. No BLE edema, no deformities or significant joint findings. Skin: Warm and dry. Healing wound w/ scab on L antecubital region Neuro: A&Ox3 (name, location, month). No focal neurological deficits.  Laboratory: Most recent CBC Lab Results  Component Value Date   WBC 7.6 02/08/2024   HGB 10.8 (L) 02/08/2024   HCT 34.9 (L) 02/08/2024   MCV 90.4 02/08/2024   PLT 455 (H) 02/08/2024   Most recent BMP    Latest Ref Rng & Units 02/08/2024    4:16 AM  BMP  Glucose 70 - 99 mg/dL 890   BUN 6 - 20 mg/dL 5   Creatinine 9.55 - 8.99 mg/dL 9.22   Sodium 864 - 854  mmol/L 137   Potassium 3.5 - 5.1 mmol/L 4.3   Chloride 98 - 111 mmol/L 100   CO2 22 - 32 mmol/L 24   Calcium  8.9 - 10.3 mg/dL 8.9    Blood cultures: Enterococcus faecalis, Aerococcus viridans, Staph hominis, Pseudomonas aeruginosa, and Staph Hemolyticus  Imaging/Diagnostic Tests: No new imaging.  Janna Ferrier, DO 02/08/2024, 2:37 PM  PGY-1, [facility] Family Medicine FPTS Intern pager: [phone], text pages welcome Secure chat group [facility] Teaching Service

## 2024-02-08 NOTE — Assessment & Plan Note (Signed)
 Afebrile overnight.  Imaging and workup negative so far.  However blood cultures grew multiple organisms, likely a contaminant d/t numerous organisms w/o consistent symptoms.  ID pharmacy concluded that Vanco and Zosyn  would provide sufficient coverage. No wounds noted on exam. - Continue Vanc and Zosyn  (6/25 - ) - Could consider CT chest and CT abd/pelvis to determine source or LP for meningitis if imaging is nevgative - Tylenol  prn for fever, likely secondary to seizures

## 2024-02-08 NOTE — Assessment & Plan Note (Deleted)
 Met SIRS criteria on admission (fever 101, tachycardic 110, & tachypneic).  CXR negative for infection, UA negative, and RPP negative.  Source unknown at this time, vitals likely secondary to seizures.  Blood cx revealed GPC (Staph  epi, mec A/C resistance) and GNR (Enterobacterales). - Monitor fever curve; fevered ON to 100.7 (oral) - Abx: Vanc and Zosyn; will de-escalate antibiotics as able once sensitivities and speciation further determined - If fevers continue ON w/ concern for bacteremia, CT chest & CT abdomen pelvis overnight; will consider LP in AM determine possible source vs meningitis - Will need to perform a back and glute exam to rule out wounds as source - Respiratory 20 pathogen PCR - Tylenol  as needed for fever, likely secondary to seizures - If imaging and respiratory panel negative, can reconsider re-consulting neurology for further recommendations

## 2024-02-08 NOTE — TOC Progression Note (Signed)
 Transition of Care [facility]) - Progression Note    Patient Details  Name: Patricia White MRN: [id number] Date of Birth: June 29, 1971  Transition of Care [facility]) CM/SW Contact  Almarie CHRISTELLA Goodie, [place] Phone Number: 02/08/2024, 3:30 PM  Clinical Narrative:   CSW still unable to reach patient's family today, and confirmed with medical team that they have not seen patient's family or had contact with patient's family. CSW met with Nat Chang, with APS, outside patient's room. Nat also familiar with patient's case as she's had them before. CSW provided information that she obtained from patient's recent SNF stay and discharge home (I.e., that family could not be reached for days, they were provided scripts for medications and did not fill them), she is filing for emergency petition for guardianship. APS does not believe that patient is safe to return home with her family, they are in agreement with SNF to LTC. Nat to update CSW when petition is submitted. CSW updated medical team and placed Nicole's information on patient's facesheet. CSW to follow.    Expected Discharge Plan: Skilled Nursing Facility    Expected Discharge Plan and Services                                               Social Determinants of Health (SDOH) Interventions SDOH Screenings   Food Insecurity: No Food Insecurity (02/06/2024)  Housing: Low Risk  (02/06/2024)  Transportation Needs: Patient Unable To Answer (02/06/2024)  Utilities: Not At Risk (02/06/2024)  Alcohol Screen: Low Risk  (03/24/2023)  Depression (PHQ2-9): Low Risk  (08/09/2023)  Financial Resource Strain: Low Risk  (03/24/2023)  Physical Activity: Insufficiently Active (03/24/2023)  Social Connections: Moderately Isolated (11/19/2023)  Stress: No Stress Concern Present (03/24/2023)  Tobacco Use: Low Risk  (02/05/2024)  Health Literacy: Adequate Health Literacy (03/24/2023)    Readmission Risk Interventions    11/16/2023   11:48 AM   Readmission Risk Prevention Plan  Transportation Screening Complete  PCP or Specialist Appt within 5-7 Days Complete  Home Care Screening Complete  Medication Review (RN CM) Complete

## 2024-02-08 NOTE — Progress Notes (Signed)
  Progress Note   Date: 02/08/2024  Patient Name: Patricia White        MRN#: [id number]  Potassium was given for hypokalemia

## 2024-02-08 NOTE — Assessment & Plan Note (Signed)
 HLD: Atorvastatin  40 mg GERD: Protonix  40 mg

## 2024-02-08 NOTE — Progress Notes (Signed)
     Daily Progress Note Intern Pager: [phone]  Patient name: EVOLEHT HOVATTER Medical record number: [id number] Date of birth: [birth year] Age: 53 y.o. Gender: female  Primary Care Provider: Alba Sharper, MD Consultants: Neurology s/o Code Status: DNR  Pt Overview and Major Events to Date:  6/24: Admitted 6/27: Emergency guardianship petitioned  Assessment and Plan:  CHRISTYNA LETENDRE is a 53 y.o. female presenting with multiple seizures 2/2 to medication non-administration from family at home.   APS report filed 6/26.  Emergency guardianship has been petitioned.  At this time she is not able to return home at family request.  Safe disposition pending.  Ongoing workup for bacteremia.  Unclear source of infection currently. Assessment & Plan Seizure (HCC) No seizures reported overnight.  Previous history of difficulty with medication adherence at home, with no breakthrough seizures during admissions or at SNFs.  - Keppra  1500 mg BID - Klonopin  0.5 mg TID - Vimpat  200 mg BID - Perampanel  4 mg daily - Phenytoin  100 mg BID - APS 6/26, awaiting guardianship - TOC consult for medication adherence - PT/OT to treat - Fall and seizure precautions - AM CBC/BMP/Mag; repleted K on 6/26 and Mag this AM Bacteremia *** overnight.  Imaging and workup negative so far.  However blood cultures grew multiple organisms, likely a contaminant d/t numerous organisms w/o consistent symptoms.  ID pharmacy concluded that Vanco and Zosyn  would provide sufficient coverage. No wounds noted on exam. - Continue Vanc and Zosyn  (6/25 - ) - Could consider CT chest and CT abd/pelvis to determine source or LP for meningitis if imaging is nevgative - Tylenol  prn for fever, likely secondary to seizures Chronic health problem HLD: Atorvastatin  40 mg GERD: Protonix  40 mg   FEN/GI: Dysphagia 3 PPx: Lovenox  Dispo:Pending guardianship petition approval.   Subjective:  ***  Objective: Temp:  [97.6 F  (36.4 C)-99.5 F (37.5 C)] 99.5 F (37.5 C) (06/27 1945) Pulse Rate:  [88-116] 116 (06/27 1945) Resp:  [18] 18 (06/27 1945) BP: (103-136)/(60-98) 112/65 (06/27 1945) SpO2:  [97 %-100 %] 100 % (06/27 1945) Physical Exam: General: *** Cardiovascular: *** Respiratory: *** Abdomen: *** Extremities: ***  Laboratory: Most recent CBC Lab Results  Component Value Date   WBC 7.6 02/08/2024   HGB 10.8 (L) 02/08/2024   HCT 34.9 (L) 02/08/2024   MCV 90.4 02/08/2024   PLT 455 (H) 02/08/2024   Most recent BMP    Latest Ref Rng & Units 02/08/2024    4:16 AM  BMP  Glucose 70 - 99 mg/dL 890   BUN 6 - 20 mg/dL 5   Creatinine 9.55 - 8.99 mg/dL 9.22   Sodium 864 - 854 mmol/L 137   Potassium 3.5 - 5.1 mmol/L 4.3   Chloride 98 - 111 mmol/L 100   CO2 22 - 32 mmol/L 24   Calcium  8.9 - 10.3 mg/dL 8.9    Imaging/Diagnostic Tests: No new labs or imaging.  [name], DO 02/08/2024, 7:49 PM  PGY-2, [facility] FPTS Intern pager: [phone], text pages welcome Secure chat group [facility] Teaching Service

## 2024-02-08 NOTE — Consult Note (Signed)
 [facility]    Date of Admission:  02/05/2024     Total days of antibiotics 4               Reason for Consult: Polymicrobial Bacteremia  Referring Provider: Champ/Auto consult  Primary Care Provider: Alba Sharper, MD   ASSESSMENT:  Patricia White is a 53 year old African-American female with cerebral palsy, developmental delays, and seizures presenting with seizures and found to have less than optimal adherence to antiseizure medication regimen.  Course complicated by a fever on admission and polymicrobial blood cultures.  Suspect blood cultures likely represent contamination or possibly transient bacteremia secondary to seizures.  Blood cultures drawn on 02/07/2024 without growth in less than 12 hours.  Tolerating vancomycin  and piperacillin -tazobactam.  No additional seizures noted. Continue current dose of vancomycin  and piperacillin -tazobactam for now and continue to monitor blood cultures. No obvious signs of infection noted at present. Standard/universal precautions. Seizure management per Neurology. Remaining medical and supportive care per [facility] Medicine.   PLAN:  Continue current dose of vancomycin  and piperacillin -tazobactam. Monitor blood cultures for clearance of bacteremia if present. Therapeutic drug monitoring of renal function and vancomycin  levels. Seizure management per neurology Remaining medical and supportive care per family medicine.   Principal Problem:   Seizure (HCC) Active Problems:   Chronic health problem   Sepsis (HCC)   Bacteremia    atorvastatin   40 mg Oral Daily   clonazepam   0.5 mg Oral TID   enoxaparin  (LOVENOX ) injection  40 mg Subcutaneous Q24H   lacosamide   200 mg Oral BID   levETIRAcetam   1,500 mg Oral Q12H   mouth rinse  15 mL Mouth Rinse 4 times per day   pantoprazole   40 mg Oral Daily   perampanel   4 mg Oral QHS   phenytoin   100 mg Oral BID     HPI: Patricia White is a 53 y.o. 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  Patricia White has a history of seizures and was noticed to have 2 witnessed seizures at home and 3 with EMS despite antiepileptic medication.  Febrile on arrival with temperature of 101 F with no leukocytosis.  Chest x-ray unremarkable.  CT head/cervical spine with no acute intracranial pathology, fracture or sign of infection.  Neurology consulted with findings of less than optimal adherence to antiseizure regimen.  Blood cultures were drawn with concern for systemic inflammatory response syndrome.  Blood cultures became positive and are polymicrobial with 1 set showing Enterococcus faecalis, Aerococcus viridans, and Staphylococcus hominis with the other set growing Pseudomonas aeruginosa, Aerococcus viridans, and Staphylococcus hemolyticus.  Currently on broad-spectrum antibiotics with vancomycin  and piperacillin -tazobactam.  Respiratory pathogen panels were negative.  Patricia White is not able to significantly contribute the history outside of asking for more Coke.   Of note there is concern for social situation as she currently lives with her 54 year old grandmother who is taking care of her.  There is history of Adult Protective Services reports in the past to ensure adequate care.   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   Seizures (HCC)     Social History   Tobacco Use  Smoking status: Never   Smokeless tobacco: Never  Vaping Use   Vaping status: Never Used  Substance Use Topics   Alcohol use: No   Drug use: No    Family History  Problem Relation Age of Onset   Migraines Mother    Cancer Mother        liver cancer   Diabetes Maternal  Aunt    Hypertension Maternal Grandmother    Colon cancer Maternal Grandfather 60   Prostate cancer Maternal Grandfather    Throat cancer Maternal Grandfather     No Known Allergies  OBJECTIVE: Blood pressure 104/60, pulse 91, temperature 97.6 F (36.4 C), temperature source Oral, resp. rate 18, height 5' 5 (1.651 m), weight 71 kg, SpO2 97%.  Physical Exam Constitutional:      General: She is not in acute distress.    Appearance: She is well-developed.   Cardiovascular:     Rate and Rhythm: Normal rate and regular rhythm.     Heart sounds: Normal heart sounds.  Pulmonary:     Effort: Pulmonary effort is normal.     Breath sounds: Normal breath sounds.   Skin:    General: Skin is warm and dry.   Neurological:     Mental Status: She is alert.     Lab Results Lab Results  Component Value Date   WBC 7.6 02/08/2024   HGB 10.8 (L) 02/08/2024   HCT 34.9 (L) 02/08/2024   MCV 90.4 02/08/2024   PLT 455 (H) 02/08/2024    Lab Results  Component Value Date   CREATININE 0.77 02/08/2024   BUN 5 (L) 02/08/2024   NA 137 02/08/2024   K 4.3 02/08/2024   CL 100 02/08/2024   CO2 24 02/08/2024    Lab Results  Component Value Date   ALT 12 02/06/2024   AST 19 02/06/2024   ALKPHOS 91 02/06/2024   BILITOT 0.5 02/06/2024     Microbiology: Recent Results (from the past 240 hours)  Blood Culture (routine x 2)     Status: None (Preliminary result)   Collection Time: 02/05/24  8:40 PM   Specimen: BLOOD RIGHT ARM  Result Value Ref Range Status   Specimen Description BLOOD RIGHT ARM  Final   Special Requests   Final    BOTTLES DRAWN AEROBIC AND ANAEROBIC Blood Culture adequate volume   Culture  Setup Time   Final    GRAM POSITIVE COCCI IN BOTH AEROBIC AND ANAEROBIC BOTTLES GRAM NEGATIVE RODS AEROBIC BOTTLE ONLY CRITICAL RESULT CALLED TO, READ BACK BY AND VERIFIED WITH: PHARMD ELIZABETH MARTIN ON 02/06/24 @ 1441 BY DRT    Culture   Final    GRAM NEGATIVE  RODS IDENTIFICATION AND SUSCEPTIBILITIES TO FOLLOW ENTEROCOCCUS FAECALIS SUSCEPTIBILITIES TO FOLLOW AEROCOCCUS VIRIDANS Standardized susceptibility testing for this organism is not available. STAPHYLOCOCCUS HOMINIS THE SIGNIFICANCE OF ISOLATING THIS ORGANISM FROM A SINGLE SET OF BLOOD CULTURES WHEN MULTIPLE SETS ARE DRAWN IS UNCERTAIN. PLEASE NOTIFY THE MICROBIOLOGY DEPARTMENT WITHIN ONE WEEK IF SPECIATION AND SENSITIVITIES ARE REQUIRED. CRITICAL RESULT CALLED TO, READ BACK BY AND VERIFIED WITH: PHARMD E.MARTIN AT 0310 ON 02/07/2024 BY T.SAAD. Performed at [facility], [address], [place], [place] [address]    Report Status PENDING  Incomplete  Blood Culture ID Panel (Reflexed)     Status: Abnormal   Collection Time: 02/05/24  8:40 PM  Result Value Ref Range Status   Enterococcus faecalis NOT DETECTED NOT DETECTED Final   Enterococcus Faecium NOT DETECTED NOT  DETECTED Final   Listeria monocytogenes NOT DETECTED NOT DETECTED Final   Staphylococcus species DETECTED (A) NOT DETECTED Final    Comment: CRITICAL RESULT CALLED TO, READ BACK BY AND VERIFIED WITH: PHARMD ELIZABETH MARTIN ON 02/06/24 @ 1441 BY DRT    Staphylococcus aureus (BCID) NOT DETECTED NOT DETECTED Final   Staphylococcus epidermidis DETECTED (A) NOT DETECTED Final    Comment: Methicillin (oxacillin) resistant coagulase negative staphylococcus. Possible blood culture contaminant (unless isolated from more than one blood culture draw or clinical case suggests pathogenicity). No antibiotic treatment is indicated for blood  culture contaminants. CRITICAL RESULT CALLED TO, READ BACK BY AND VERIFIED WITH: PHARMD ELIZABETH MARTIN ON 02/06/24 @ 1441 BY DRT    Staphylococcus lugdunensis NOT DETECTED NOT DETECTED Final   Streptococcus species NOT DETECTED NOT DETECTED Final   Streptococcus agalactiae NOT DETECTED NOT DETECTED Final   Streptococcus pneumoniae NOT DETECTED NOT DETECTED Final   Streptococcus pyogenes NOT  DETECTED NOT DETECTED Final   A.calcoaceticus-baumannii NOT DETECTED NOT DETECTED Final   Bacteroides fragilis NOT DETECTED NOT DETECTED Final   Enterobacterales DETECTED (A) NOT DETECTED Final    Comment: Enterobacterales represent a large order of gram negative bacteria, not a single organism. Refer to culture for further identification. CRITICAL RESULT CALLED TO, READ BACK BY AND VERIFIED WITH: PHARMD ELIZABETH MARTIN ON 02/06/24 @ 1441 BY DRT    Enterobacter cloacae complex NOT DETECTED NOT DETECTED Final   Escherichia coli NOT DETECTED NOT DETECTED Final   Klebsiella aerogenes NOT DETECTED NOT DETECTED Final   Klebsiella oxytoca NOT DETECTED NOT DETECTED Final   Klebsiella pneumoniae NOT DETECTED NOT DETECTED Final   Proteus species NOT DETECTED NOT DETECTED Final   Salmonella species NOT DETECTED NOT DETECTED Final   Serratia marcescens NOT DETECTED NOT DETECTED Final   Haemophilus influenzae NOT DETECTED NOT DETECTED Final   Neisseria meningitidis NOT DETECTED NOT DETECTED Final   Pseudomonas aeruginosa NOT DETECTED NOT DETECTED Final   Stenotrophomonas maltophilia NOT DETECTED NOT DETECTED Final   Candida albicans NOT DETECTED NOT DETECTED Final   Candida auris NOT DETECTED NOT DETECTED Final   Candida glabrata NOT DETECTED NOT DETECTED Final   Candida krusei NOT DETECTED NOT DETECTED Final   Candida parapsilosis NOT DETECTED NOT DETECTED Final   Candida tropicalis NOT DETECTED NOT DETECTED Final   Cryptococcus neoformans/gattii NOT DETECTED NOT DETECTED Final   CTX-M ESBL NOT DETECTED NOT DETECTED Final   Carbapenem resistance IMP NOT DETECTED NOT DETECTED Final   Carbapenem resistance KPC NOT DETECTED NOT DETECTED Final   Methicillin resistance mecA/C DETECTED (A) NOT DETECTED Final    Comment: CRITICAL RESULT CALLED TO, READ BACK BY AND VERIFIED WITH: PHARMD ELIZABETH MARTIN ON 02/06/24 @ 1441 BY DRT    Carbapenem resistance NDM NOT DETECTED NOT DETECTED Final   Carbapenem  resist OXA 48 LIKE NOT DETECTED NOT DETECTED Final   Carbapenem resistance VIM NOT DETECTED NOT DETECTED Final    Comment: Performed at [facility], [address], [place], [place] [address]  Blood Culture (routine x 2)     Status: None (Preliminary result)   Collection Time: 02/05/24  8:45 PM   Specimen: BLOOD LEFT ARM  Result Value Ref Range Status   Specimen Description BLOOD LEFT ARM  Final   Special Requests   Final    BOTTLES DRAWN AEROBIC AND ANAEROBIC Blood Culture adequate volume   Culture  Setup Time   Final    GRAM POSITIVE COCCI IN BOTH AEROBIC  AND ANAEROBIC BOTTLES    Culture   Final    GRAM NEGATIVE RODS IDENTIFICATION AND SUSCEPTIBILITIES TO FOLLOW PSEUDOMONAS AERUGINOSA SUSCEPTIBILITIES TO FOLLOW AEROCOCCUS VIRIDANS Standardized susceptibility testing for this organism is not available. STAPHYLOCOCCUS HAEMOLYTICUS THE SIGNIFICANCE OF ISOLATING THIS ORGANISM FROM A SINGLE SET OF BLOOD CULTURES WHEN MULTIPLE SETS ARE DRAWN IS UNCERTAIN. PLEASE NOTIFY THE MICROBIOLOGY DEPARTMENT WITHIN ONE WEEK IF SPECIATION AND SENSITIVITIES ARE REQUIRED. CRITICAL RESULT CALLED TO, READ BACK BY AND VERIFIED WITH: PHARMD E.MARTIN AT 0310 ON 02/07/2024 BY T.SAAD. Performed at [facility], [address] [place], [place] [address]    Report Status PENDING  Incomplete  Resp panel by RT-PCR (RSV, Flu A&B, Covid) Anterior Nasal Swab     Status: None   Collection Time: 02/05/24  8:46 PM   Specimen: Anterior Nasal Swab  Result Value Ref Range Status   SARS Coronavirus 2 by RT PCR NEGATIVE NEGATIVE Final   Influenza A by PCR NEGATIVE NEGATIVE Final   Influenza B by PCR NEGATIVE NEGATIVE Final    Comment: (NOTE) The Xpert Xpress SARS-CoV-2/FLU/RSV plus assay is intended as an aid in the diagnosis of influenza from Nasopharyngeal swab specimens and should not be used as a sole basis for treatment. Nasal washings and aspirates are unacceptable for Xpert Xpress  SARS-CoV-2/FLU/RSV testing.  Fact Sheet for Patients: BloggerCourse.com  Fact Sheet for Healthcare Providers: SeriousBroker.it  This test is not yet approved or cleared by the [place]  FDA and has been authorized for detection and/or diagnosis of SARS-CoV-2 by FDA under an Emergency Use Authorization (EUA). This EUA will remain in effect (meaning this test can be used) for the duration of the COVID-19 declaration under Section 564(b)(1) of the Act, 21 U.S.C. section 360bbb-3(b)(1), unless the authorization is terminated or revoked.     Resp Syncytial Virus by PCR NEGATIVE NEGATIVE Final    Comment: (NOTE) Fact Sheet for Patients: BloggerCourse.com  Fact Sheet for Healthcare Providers: SeriousBroker.it  This test is not yet approved or cleared by the [place]  FDA and has been authorized for detection and/or diagnosis of SARS-CoV-2 by FDA under an Emergency Use Authorization (EUA). This EUA will remain in effect (meaning this test can be used) for the duration of the COVID-19 declaration under Section 564(b)(1) of the Act, 21 U.S.C. section 360bbb-3(b)(1), unless the authorization is terminated or revoked.  Performed at [facility], [address] [place], [place] [address]   Respiratory (~20 pathogens) panel by PCR     Status: None   Collection Time: 02/05/24 10:40 PM   Specimen: Nasopharyngeal Swab; Respiratory  Result Value Ref Range Status   Adenovirus NOT DETECTED NOT DETECTED Final   Coronavirus 229E NOT DETECTED NOT DETECTED Final    Comment: (NOTE) The Coronavirus on the Respiratory Panel, DOES NOT test for the novel  Coronavirus (2019 nCoV)    Coronavirus HKU1 NOT DETECTED NOT DETECTED Final   Coronavirus NL63 NOT DETECTED NOT DETECTED Final   Coronavirus OC43 NOT DETECTED NOT DETECTED Final   Metapneumovirus NOT DETECTED NOT DETECTED Final    Rhinovirus / Enterovirus NOT DETECTED NOT DETECTED Final   Influenza A NOT DETECTED NOT DETECTED Final   Influenza B NOT DETECTED NOT DETECTED Final   Parainfluenza Virus 1 NOT DETECTED NOT DETECTED Final   Parainfluenza Virus 2 NOT DETECTED NOT DETECTED Final   Parainfluenza Virus 3 NOT DETECTED NOT DETECTED Final   Parainfluenza Virus 4 NOT DETECTED NOT DETECTED Final   Respiratory Syncytial Virus  NOT DETECTED NOT DETECTED Final   Bordetella pertussis NOT DETECTED NOT DETECTED Final   Bordetella Parapertussis NOT DETECTED NOT DETECTED Final   Chlamydophila pneumoniae NOT DETECTED NOT DETECTED Final   Mycoplasma pneumoniae NOT DETECTED NOT DETECTED Final    Comment: Performed at [facility], [address] [place], [place] [address]  Culture, blood (Routine X 2) w Reflex to ID Panel     Status: None (Preliminary result)   Collection Time: 02/07/24  7:33 PM   Specimen: BLOOD  Result Value Ref Range Status   Specimen Description BLOOD SITE NOT SPECIFIED  Final   Special Requests   Final    BOTTLES DRAWN AEROBIC ONLY Blood Culture results may not be optimal due to an inadequate volume of blood received in culture bottles   Culture   Final    NO GROWTH < 12 HOURS Performed at [facility], [address] [place], [place] [address]    Report Status PENDING  Incomplete  Culture, blood (Routine X 2) w Reflex to ID Panel     Status: None (Preliminary result)   Collection Time: 02/07/24  7:38 PM   Specimen: BLOOD  Result Value Ref Range Status   Specimen Description BLOOD SITE NOT SPECIFIED  Final   Special Requests   Final    BOTTLES DRAWN AEROBIC ONLY Blood Culture results may not be optimal due to an inadequate volume of blood received in culture bottles   Culture   Final    NO GROWTH < 12 HOURS Performed at [facility], [address], [place], [place] [address]    Report Status PENDING  Incomplete   I have personally spent 30 minutes involved in face-to-face  and non-face-to-face activities for this patient on the day of the visit. Professional time spent includes the following activities: preparing to see the patient (review of tests), obtaining and reviewing separately obtained history (admission/discharge record), performing a medically appropriate examination, ordering medications, communicating with other health care professionals, documenting clinical information in the EMR, communicating results and counseling patient regarding medication and plan of care, and care coordination.    Greg Laban Orourke, NP [facility]  02/08/2024  11:32 AM

## 2024-02-09 DIAGNOSIS — R7881 Bacteremia: Secondary | ICD-10-CM | POA: Diagnosis not present

## 2024-02-09 DIAGNOSIS — R569 Unspecified convulsions: Secondary | ICD-10-CM | POA: Diagnosis not present

## 2024-02-09 LAB — MAGNESIUM: Magnesium: 1.9 mg/dL (ref 1.7–2.4)

## 2024-02-09 LAB — CULTURE, BLOOD (ROUTINE X 2): Special Requests: ADEQUATE

## 2024-02-09 LAB — BASIC METABOLIC PANEL WITH GFR
Anion gap: 14 (ref 5–15)
BUN: 6 mg/dL (ref 6–20)
CO2: 24 mmol/L (ref 22–32)
Calcium: 9 mg/dL (ref 8.9–10.3)
Chloride: 101 mmol/L (ref 98–111)
Creatinine, Ser: 0.81 mg/dL (ref 0.44–1.00)
GFR, Estimated: >60 mL/min (ref >60)
Glucose, Bld: 131 mg/dL — ABNORMAL HIGH (ref 70–99)
Potassium: 4.1 mmol/L (ref 3.5–5.1)
Sodium: 139 mmol/L (ref 135–145)

## 2024-02-09 LAB — PHOSPHORUS: Phosphorus: 3.5 mg/dL (ref 2.5–4.6)

## 2024-02-09 NOTE — Assessment & Plan Note (Signed)
 Hemodynamically stable, afebrile. Unclear source. Repeat bcx no growth to date. - Continue Vanc and Zosyn  (6/25 - ) - ID consulted, recommendations appreciated. - Could consider CT chest and CT abd/pelvis to determine source or LP for meningitis if imaging is nevgative - Tylenol  prn for fever, likely secondary to seizures

## 2024-02-09 NOTE — Assessment & Plan Note (Signed)
 No seizures reported overnight.  Previous history of difficulty with medication adherence at home, with no breakthrough seizures during admissions or at SNFs.  - Keppra  1500 mg BID - Klonopin  0.5 mg TID - Vimpat  200 mg BID - Perampanel  4 mg daily - Phenytoin  100 mg BID - APS 6/26, awaiting guardianship - TOC consult for medication adherence - PT/OT to treat - Fall and seizure precautions - AM CBC/BMP/Mag; repleted K on 6/26 and Mag this AM

## 2024-02-09 NOTE — Assessment & Plan Note (Signed)
 HLD: Atorvastatin  40 mg GERD: Protonix  40 mg

## 2024-02-09 NOTE — Plan of Care (Signed)
   Problem: Clinical Measurements: Goal: Ability to maintain clinical measurements within normal limits will improve Outcome: Progressing Goal: Will remain free from infection Outcome: Progressing Goal: Diagnostic test results will improve Outcome: Progressing Goal: Respiratory complications will improve Outcome: Progressing Goal: Cardiovascular complication will be avoided Outcome: Progressing   Problem: Nutrition: Goal: Adequate nutrition will be maintained Outcome: Progressing   Problem: Safety: Goal: Ability to remain free from injury will improve Outcome: Progressing   Problem: Skin Integrity: Goal: Risk for impaired skin integrity will decrease Outcome: Progressing

## 2024-02-09 NOTE — Progress Notes (Signed)
     Daily Progress Note Intern Pager: [phone]  Patient name: Patricia White Medical record number: [id number] Date of birth: 07-Jul-1971 Age: 53 y.o. Gender: female  Primary Care Provider: Alba Sharper, MD Consultants: Neurology S/O Code Status: DNR  Pt Overview and Major Events to Date:  6/24: Admitted 6/27: Emergency guardianship petitioned  Assessment and Plan:  Patricia White is a 53 year old female admitted for multiple breakthrough seizures secondary to medication non-administration.  Pregnancy does have resolved, emergency guardianship has been petitioned, disposition is pending safety.  Pertinent PMH/PSH includes cerebral palsy, asthma, seizures.  Assessment & Plan Seizure (HCC) No seizures reported overnight.  Previous history of difficulty with medication adherence at home, with no breakthrough seizures during admissions or at SNFs.  - Keppra  1500 mg BID - Klonopin  0.5 mg TID - Vimpat  200 mg BID - Perampanel  4 mg daily - Phenytoin  100 mg BID - APS 6/26, awaiting guardianship - TOC consult for medication adherence - PT/OT to treat - Fall and seizure precautions - AM CBC/BMP/Mag; repleted K on 6/26 and Mag this AM Bacteremia Afebrile overnight.  Imaging and workup negative so far.  However blood cultures grew multiple organisms, likely a contaminant d/t numerous organisms w/o consistent symptoms.  ID pharmacy concluded that Vanco and Zosyn  would provide sufficient coverage. No wounds noted on exam. - Continue Vanc and Zosyn  (6/25 - ) - Could consider CT chest and CT abd/pelvis to determine source or LP for meningitis if imaging is nevgative - Tylenol  prn for fever, likely secondary to seizures Chronic health problem HLD: Atorvastatin  40 mg GERD: Protonix  40 mg    FEN/GI: Dysphagia 3 PPx: Lovenox  Dispo: Pending guardianship petition approval  Subjective:  ***  Objective: Temp:  [97.6 F (36.4 C)-99.1 F (37.3 C)] 97.6 F (36.4 C) (06/[birth year]  1946) Pulse Rate:  [111-117] 115 (06/28 1946) Resp:  [16-18] 18 (06/28 1946) BP: (108-124)/(75-95) 120/95 (06/28 1946) SpO2:  [97 %-100 %] 100 % (06/28 1946) Physical Exam: General: *** Cardiovascular: *** Respiratory: *** Abdomen: *** Extremities: ***  Laboratory: Most recent CBC Lab Results  Component Value Date   WBC 7.6 02/08/2024   HGB 10.8 (L) 02/08/2024   HCT 34.9 (L) 02/08/2024   MCV 90.4 02/08/2024   PLT 455 (H) 02/08/2024   Most recent BMP    Latest Ref Rng & Units 02/09/2024    6:08 AM  BMP  Glucose 70 - 99 mg/dL 868   BUN 6 - 20 mg/dL 6   Creatinine 9.55 - 8.99 mg/dL 9.18   Sodium 864 - 854 mmol/L 139   Potassium 3.5 - 5.1 mmol/L 4.1   Chloride 98 - 111 mmol/L 101   CO2 22 - 32 mmol/L 24   Calcium  8.9 - 10.3 mg/dL 9.0     Magnesium ***   Imaging/Diagnostic Tests: No new imaging.  Alba Sharper, MD 02/09/2024, 10:58 PM  PGY-2, [facility] FPTS Intern pager: [phone], text pages welcome Secure chat group [facility] Teaching Service

## 2024-02-10 DIAGNOSIS — R7881 Bacteremia: Secondary | ICD-10-CM | POA: Diagnosis not present

## 2024-02-10 DIAGNOSIS — R569 Unspecified convulsions: Secondary | ICD-10-CM | POA: Diagnosis not present

## 2024-02-10 LAB — BASIC METABOLIC PANEL WITH GFR
Anion gap: 15 (ref 5–15)
BUN: 5 mg/dL — ABNORMAL LOW (ref 6–20)
CO2: 23 mmol/L (ref 22–32)
Calcium: 9.1 mg/dL (ref 8.9–10.3)
Chloride: 102 mmol/L (ref 98–111)
Creatinine, Ser: 0.71 mg/dL (ref 0.44–1.00)
GFR, Estimated: >60 mL/min (ref >60)
Glucose, Bld: 108 mg/dL — ABNORMAL HIGH (ref 70–99)
Potassium: 3.9 mmol/L (ref 3.5–5.1)
Sodium: 140 mmol/L (ref 135–145)

## 2024-02-10 LAB — MAGNESIUM: Magnesium: 1.9 mg/dL (ref 1.7–2.4)

## 2024-02-10 MED ORDER — POLYETHYLENE GLYCOL 3350 17 G PO PACK
17.0000 g | PACK | Freq: Every day | ORAL | Status: DC
  Administered 2024-02-10 – 2024-02-20 (×9): 17 g via ORAL
  Filled 2024-02-10 (×9): qty 1

## 2024-02-10 MED ORDER — SODIUM CHLORIDE 0.9% FLUSH: 10.0000 mL | INTRAVENOUS | Status: DC | PRN

## 2024-02-10 MED ORDER — SODIUM CHLORIDE 0.9% FLUSH
10.0000 mL | Freq: Two times a day (BID) | INTRAVENOUS | Status: DC
  Administered 2024-02-10 (×2): 10 mL
  Administered 2024-02-11: 20 mL
  Administered 2024-02-11 – 2024-02-20 (×18): 10 mL

## 2024-02-10 MED ORDER — SENNOSIDES-DOCUSATE SODIUM 8.6-50 MG PO TABS
1.0000 | ORAL_TABLET | Freq: Every day | ORAL | Status: DC
  Administered 2024-02-10 – 2024-02-20 (×9): 1 via ORAL
  Filled 2024-02-10 (×9): qty 1

## 2024-02-10 NOTE — Plan of Care (Signed)
 Plan of care is reviewed. Pt has been progressing. She is stable hemodynamically, NSR on the monitor, afebrile, normal respiratory effort, no distress overnight. We will continue to monitor.  Problem: Clinical Measurements: Goal: Ability to maintain clinical measurements within normal limits will improve Outcome: Progressing Goal: Will remain free from infection Outcome: Progressing Goal: Diagnostic test results will improve Outcome: Progressing Goal: Respiratory complications will improve Outcome: Progressing Goal: Cardiovascular complication will be avoided Outcome: Progressing   Problem: Activity: Goal: Risk for activity intolerance will decrease Outcome: Progressing   Problem: Nutrition: Goal: Adequate nutrition will be maintained Outcome: Progressing   Problem: Elimination: Goal: Will not experience complications related to bowel motility Outcome: Progressing Goal: Will not experience complications related to urinary retention Outcome: Progressing   Problem: Safety: Goal: Ability to remain free from injury will improve Outcome: Progressing   Problem: Education: Goal: Expressions of having a comfortable level of knowledge regarding the disease process will increase Outcome: Progressing   Problem: Health Behavior/Discharge Planning: Goal: Compliance with prescribed medication regimen will improve Outcome: Progressing  Wendi Dash, RN

## 2024-02-10 NOTE — Progress Notes (Signed)

## 2024-02-10 NOTE — Progress Notes (Signed)
 ID brief note  6/24 blood cx @ 20: 40 Rt arm: GNR, E faecalis, Aerococcus viridans, staph hominis  6/24 blood cx @ 20: 45 left arm: PsA, aerococcus viridans, staph haemolyticus.   DC Vancomycin , continue zosyn   Will check TTE  Fu repeat blood cx and final blood cx on 6/24  New ID team starting Monday   Verdell Kincannon, MD Infectious Disease Physician [facility] [address] [place], [place] [address] Phone: [phone]  Fax: [phone]

## 2024-02-11 ENCOUNTER — Inpatient Hospital Stay (HOSPITAL_COMMUNITY)

## 2024-02-11 DIAGNOSIS — R7881 Bacteremia: Secondary | ICD-10-CM | POA: Diagnosis not present

## 2024-02-11 DIAGNOSIS — Z789 Other specified health status: Secondary | ICD-10-CM | POA: Diagnosis not present

## 2024-02-11 DIAGNOSIS — B9689 Other specified bacterial agents as the cause of diseases classified elsewhere: Secondary | ICD-10-CM | POA: Diagnosis not present

## 2024-02-11 DIAGNOSIS — R569 Unspecified convulsions: Secondary | ICD-10-CM | POA: Diagnosis not present

## 2024-02-11 LAB — BASIC METABOLIC PANEL WITH GFR
Anion gap: 12 (ref 5–15)
BUN: 5 mg/dL — ABNORMAL LOW (ref 6–20)
CO2: 25 mmol/L (ref 22–32)
Calcium: 8.9 mg/dL (ref 8.9–10.3)
Chloride: 100 mmol/L (ref 98–111)
Creatinine, Ser: 0.78 mg/dL (ref 0.44–1.00)
GFR, Estimated: >60 mL/min (ref >60)
Glucose, Bld: 112 mg/dL — ABNORMAL HIGH (ref 70–99)
Potassium: 3.5 mmol/L (ref 3.5–5.1)
Sodium: 137 mmol/L (ref 135–145)

## 2024-02-11 LAB — CULTURE, BLOOD (ROUTINE X 2): Special Requests: ADEQUATE

## 2024-02-11 LAB — ECHOCARDIOGRAM COMPLETE
Area-P 1/2: 6.96 cm2
Calc EF: 71.5 %
Height: 65 in
S' Lateral: 2.1 cm
Single Plane A2C EF: 64.1 %
Single Plane A4C EF: 79.4 %
Weight: 2504.43 [oz_av]

## 2024-02-11 LAB — MAGNESIUM: Magnesium: 1.9 mg/dL (ref 1.7–2.4)

## 2024-02-11 LAB — PHENYTOIN LEVEL, TOTAL: Phenytoin Lvl: 6.5 ug/mL — ABNORMAL LOW (ref 10.0–20.0)

## 2024-02-11 LAB — ALBUMIN: Albumin: 2.7 g/dL — ABNORMAL LOW (ref 3.5–5.0)

## 2024-02-11 MED ORDER — PHENYTOIN 50 MG PO CHEW
150.0000 mg | CHEWABLE_TABLET | Freq: Every day | ORAL | Status: DC
  Administered 2024-02-11 – 2024-02-19 (×9): 150 mg via ORAL
  Filled 2024-02-11 (×9): qty 3

## 2024-02-11 MED ORDER — PERFLUTREN LIPID MICROSPHERE
1.0000 mL | INTRAVENOUS | Status: AC | PRN
  Administered 2024-02-11: 1.5 mL via INTRAVENOUS

## 2024-02-11 MED ORDER — PHENYTOIN 50 MG PO CHEW
100.0000 mg | CHEWABLE_TABLET | Freq: Every day | ORAL | Status: DC
  Administered 2024-02-12 – 2024-02-20 (×9): 100 mg via ORAL
  Filled 2024-02-11 (×9): qty 2

## 2024-02-11 NOTE — Progress Notes (Signed)
 [facility]  Date of Admission:  02/05/2024     Reason for Follow Up: Seizure [facility])  Total days of antibiotics 7         ASSESSMENT:  Patricia White's blood cultures have remained without growth to date in the setting of polymicrobial bacteremia in the presence of recent seizures in poorly controlled seizure disorder.  Currently on day 6 of antimicrobial therapy with piperacillin -tazobactam with vancomycin  stopped.  The polymicrobial cultures likely represent contamination as opposed to true infection with mildly elevated temperature related to seizures.  Discussed plan of care to continue with antibiotics for 1 more day and then watchful waiting.  Continue to monitor cultures from 02/07/2024 for presence of bacteremia until finalized.  Seizure management per neurology/primary team.  Continue universal/standard precautions.  Remaining medical and supportive care per family medicine.  PLAN:  Continue current dose of piperacillin -tazobactam through 02/12/2024 and stop antibiotics. Monitor cultures from 02/07/2024 until finalized. Continue standard/universal precautions. Remaining medical and supportive care per family medicine.  ID will sign off.  Please reconsult if needed.  Principal Problem:   Seizure (HCC) Active Problems:   Chronic health problem   Sepsis (HCC)   Bacteremia    atorvastatin   40 mg Oral Daily   clonazepam   0.5 mg Oral TID   enoxaparin  (LOVENOX ) injection  40 mg Subcutaneous Q24H   lacosamide   200 mg Oral BID   levETIRAcetam   1,500 mg Oral Q12H   mouth rinse  15 mL Mouth Rinse 4 times per day   pantoprazole   40 mg Oral Daily   perampanel   4 mg Oral QHS   [START ON 02/12/2024] phenytoin   100 mg Oral Daily   And   phenytoin   150 mg Oral QHS   polyethylene glycol  17 g Oral Daily   senna-docusate  1 tablet Oral Daily   sodium chloride  flush  10-40 mL Intracatheter Q12H    SUBJECTIVE:  Afebrile with no acute events.  No recent seizure.   Blood cultures from 02/07/2024 remain without growth to date.  Asking for a snack.  No Known Allergies   Review of Systems: Review of Systems  Unable to perform ROS: Mental acuity      OBJECTIVE: Vitals:   02/11/24 0004 02/11/24 0333 02/11/24 0740 02/11/24 1116  BP: 136/89 112/72 108/85 107/79  Pulse: (!) 107 (!) 105 87 (!) 107  Resp: 16 16 18 20   Temp: 98.7 F (37.1 C) 98 F (36.7 C) 98.1 F (36.7 C) 98.7 F (37.1 C)  TempSrc: Oral Oral Axillary Oral  SpO2: 97% 98% 97% 99%  Weight:      Height:       Body mass index is 26.05 kg/m.  Physical Exam Constitutional:      General: She is not in acute distress.    Appearance: She is well-developed.   Cardiovascular:     Rate and Rhythm: Regular rhythm. Tachycardia present.     Heart sounds: Normal heart sounds.  Pulmonary:     Effort: Pulmonary effort is normal.     Breath sounds: Normal breath sounds.   Skin:    General: Skin is warm and dry.   Neurological:     Mental Status: She is alert.     Lab Results Lab Results  Component Value Date   WBC 7.6 02/08/2024   HGB 10.8 (L) 02/08/2024   HCT 34.9 (L) 02/08/2024   MCV 90.4 02/08/2024   PLT 455 (H) 02/08/2024    Lab  Results  Component Value Date   CREATININE 0.78 02/11/2024   BUN 5 (L) 02/11/2024   NA 137 02/11/2024   K 3.5 02/11/2024   CL 100 02/11/2024   CO2 25 02/11/2024    Lab Results  Component Value Date   ALT 12 02/06/2024   AST 19 02/06/2024   ALKPHOS 91 02/06/2024   BILITOT 0.5 02/06/2024     Microbiology: Recent Results (from the past 240 hours)  Blood Culture (routine x 2)     Status: Abnormal   Collection Time: 02/05/24  8:40 PM   Specimen: BLOOD RIGHT ARM  Result Value Ref Range Status   Specimen Description BLOOD RIGHT ARM  Final   Special Requests   Final    BOTTLES DRAWN AEROBIC AND ANAEROBIC Blood Culture adequate volume   Culture  Setup Time   Final    GRAM POSITIVE COCCI IN BOTH AEROBIC AND ANAEROBIC BOTTLES GRAM  NEGATIVE RODS AEROBIC BOTTLE ONLY CRITICAL RESULT CALLED TO, READ BACK BY AND VERIFIED WITH: PHARMD ELIZABETH MARTIN ON 02/06/24 @ 1441 BY DRT    Culture (A)  Final    ACINETOBACTER LWOFFII ENTEROCOCCUS FAECALIS AEROCOCCUS VIRIDANS Standardized susceptibility testing for this organism is not available. STAPHYLOCOCCUS HOMINIS THE SIGNIFICANCE OF ISOLATING THIS ORGANISM FROM A SINGLE SET OF BLOOD CULTURES WHEN MULTIPLE SETS ARE DRAWN IS UNCERTAIN. PLEASE NOTIFY THE MICROBIOLOGY DEPARTMENT WITHIN ONE WEEK IF SPECIATION AND SENSITIVITIES ARE REQUIRED. CRITICAL RESULT CALLED TO, READ BACK BY AND VERIFIED WITH: PHARMD E.MARTIN AT 0310 ON 02/07/2024 BY T.SAAD. Performed at [facility], [address] [place], [place] [address]    Report Status 02/11/2024 FINAL  Final   Organism ID, Bacteria ENTEROCOCCUS FAECALIS  Final   Organism ID, Bacteria ACINETOBACTER LWOFFII  Final      Susceptibility   Acinetobacter lwoffii - MIC*    CEFTAZIDIME <=1 SENSITIVE Sensitive     CIPROFLOXACIN  <=0.25 SENSITIVE Sensitive     GENTAMICIN <=1 SENSITIVE Sensitive     IMIPENEM <=0.25 SENSITIVE Sensitive     TRIMETH/SULFA <=20 SENSITIVE Sensitive     AMPICILLIN/SULBACTAM <=2 SENSITIVE Sensitive     * ACINETOBACTER LWOFFII   Enterococcus faecalis - MIC*    AMPICILLIN <=2 SENSITIVE Sensitive     VANCOMYCIN  1 SENSITIVE Sensitive     GENTAMICIN SYNERGY SENSITIVE Sensitive     * ENTEROCOCCUS FAECALIS  Blood Culture ID Panel (Reflexed)     Status: Abnormal   Collection Time: 02/05/24  8:40 PM  Result Value Ref Range Status   Enterococcus faecalis NOT DETECTED NOT DETECTED Final   Enterococcus Faecium NOT DETECTED NOT DETECTED Final   Listeria monocytogenes NOT DETECTED NOT DETECTED Final   Staphylococcus species DETECTED (A) NOT DETECTED Final    Comment: CRITICAL RESULT CALLED TO, READ BACK BY AND VERIFIED WITH: PHARMD ELIZABETH MARTIN ON 02/06/24 @ 1441 BY DRT    Staphylococcus aureus (BCID) NOT DETECTED  NOT DETECTED Final   Staphylococcus epidermidis DETECTED (A) NOT DETECTED Final    Comment: Methicillin (oxacillin) resistant coagulase negative staphylococcus. Possible blood culture contaminant (unless isolated from more than one blood culture draw or clinical case suggests pathogenicity). No antibiotic treatment is indicated for blood  culture contaminants. CRITICAL RESULT CALLED TO, READ BACK BY AND VERIFIED WITH: PHARMD ELIZABETH MARTIN ON 02/06/24 @ 1441 BY DRT    Staphylococcus lugdunensis NOT DETECTED NOT DETECTED Final   Streptococcus species NOT DETECTED NOT DETECTED Final   Streptococcus agalactiae NOT DETECTED NOT DETECTED Final   Streptococcus pneumoniae  NOT DETECTED NOT DETECTED Final   Streptococcus pyogenes NOT DETECTED NOT DETECTED Final   A.calcoaceticus-baumannii NOT DETECTED NOT DETECTED Final   Bacteroides fragilis NOT DETECTED NOT DETECTED Final   Enterobacterales DETECTED (A) NOT DETECTED Final    Comment: Enterobacterales represent a large order of gram negative bacteria, not a single organism. Refer to culture for further identification. CRITICAL RESULT CALLED TO, READ BACK BY AND VERIFIED WITH: PHARMD ELIZABETH MARTIN ON 02/06/24 @ 1441 BY DRT    Enterobacter cloacae complex NOT DETECTED NOT DETECTED Final   Escherichia coli NOT DETECTED NOT DETECTED Final   Klebsiella aerogenes NOT DETECTED NOT DETECTED Final   Klebsiella oxytoca NOT DETECTED NOT DETECTED Final   Klebsiella pneumoniae NOT DETECTED NOT DETECTED Final   Proteus species NOT DETECTED NOT DETECTED Final   Salmonella species NOT DETECTED NOT DETECTED Final   Serratia marcescens NOT DETECTED NOT DETECTED Final   Haemophilus influenzae NOT DETECTED NOT DETECTED Final   Neisseria meningitidis NOT DETECTED NOT DETECTED Final   Pseudomonas aeruginosa NOT DETECTED NOT DETECTED Final   Stenotrophomonas maltophilia NOT DETECTED NOT DETECTED Final   Candida albicans NOT DETECTED NOT DETECTED Final   Candida  auris NOT DETECTED NOT DETECTED Final   Candida glabrata NOT DETECTED NOT DETECTED Final   Candida krusei NOT DETECTED NOT DETECTED Final   Candida parapsilosis NOT DETECTED NOT DETECTED Final   Candida tropicalis NOT DETECTED NOT DETECTED Final   Cryptococcus neoformans/gattii NOT DETECTED NOT DETECTED Final   CTX-M ESBL NOT DETECTED NOT DETECTED Final   Carbapenem resistance IMP NOT DETECTED NOT DETECTED Final   Carbapenem resistance KPC NOT DETECTED NOT DETECTED Final   Methicillin resistance mecA/C DETECTED (A) NOT DETECTED Final    Comment: CRITICAL RESULT CALLED TO, READ BACK BY AND VERIFIED WITH: PHARMD ELIZABETH MARTIN ON 02/06/24 @ 1441 BY DRT    Carbapenem resistance NDM NOT DETECTED NOT DETECTED Final   Carbapenem resist OXA 48 LIKE NOT DETECTED NOT DETECTED Final   Carbapenem resistance VIM NOT DETECTED NOT DETECTED Final    Comment: Performed at [facility], [address], [place], [place] [address]  Blood Culture (routine x 2)     Status: None   Collection Time: 02/05/24  8:45 PM   Specimen: BLOOD LEFT ARM  Result Value Ref Range Status   Specimen Description BLOOD LEFT ARM  Final   Special Requests   Final    BOTTLES DRAWN AEROBIC AND ANAEROBIC Blood Culture adequate volume   Culture  Setup Time   Final    GRAM POSITIVE COCCI IN BOTH AEROBIC AND ANAEROBIC BOTTLES    Culture   Final    Two isolates with different morphologies were identified as the same organism.The most resistant organism was reported. PSEUDOMONAS AERUGINOSA AEROCOCCUS VIRIDANS Standardized susceptibility testing for this organism is not available. STAPHYLOCOCCUS HAEMOLYTICUS THE SIGNIFICANCE OF ISOLATING THIS ORGANISM FROM A SINGLE SET OF BLOOD CULTURES WHEN MULTIPLE SETS ARE DRAWN IS UNCERTAIN. PLEASE NOTIFY THE MICROBIOLOGY DEPARTMENT WITHIN ONE WEEK IF SPECIATION AND SENSITIVITIES ARE REQUIRED. CRITICAL RESULT CALLED TO, READ BACK BY AND VERIFIED WITH: PHARMD E.MARTIN AT 0310 ON 02/07/2024  BY T.SAAD. Performed at [facility], [address], [place], [place] [address]    Report Status 02/09/2024 FINAL  Final   Organism ID, Bacteria PSEUDOMONAS AERUGINOSA  Final      Susceptibility   Pseudomonas aeruginosa - MIC*    CEFTAZIDIME 2 SENSITIVE Sensitive     CIPROFLOXACIN  <=0.25 SENSITIVE Sensitive  GENTAMICIN <=1 SENSITIVE Sensitive     IMIPENEM 2 SENSITIVE Sensitive     PIP/TAZO <=4 SENSITIVE Sensitive ug/mL    CEFEPIME  2 SENSITIVE Sensitive     * PSEUDOMONAS AERUGINOSA  Resp panel by RT-PCR (RSV, Flu A&B, Covid) Anterior Nasal Swab     Status: None   Collection Time: 02/05/24  8:46 PM   Specimen: Anterior Nasal Swab  Result Value Ref Range Status   SARS Coronavirus 2 by RT PCR NEGATIVE NEGATIVE Final   Influenza A by PCR NEGATIVE NEGATIVE Final   Influenza B by PCR NEGATIVE NEGATIVE Final    Comment: (NOTE) The Xpert Xpress SARS-CoV-2/FLU/RSV plus assay is intended as an aid in the diagnosis of influenza from Nasopharyngeal swab specimens and should not be used as a sole basis for treatment. Nasal washings and aspirates are unacceptable for Xpert Xpress SARS-CoV-2/FLU/RSV testing.  Fact Sheet for Patients: BloggerCourse.com  Fact Sheet for Healthcare Providers: SeriousBroker.it  This test is not yet approved or cleared by the [place]  FDA and has been authorized for detection and/or diagnosis of SARS-CoV-2 by FDA under an Emergency Use Authorization (EUA). This EUA will remain in effect (meaning this test can be used) for the duration of the COVID-19 declaration under Section 564(b)(1) of the Act, 21 U.S.C. section 360bbb-3(b)(1), unless the authorization is terminated or revoked.     Resp Syncytial Virus by PCR NEGATIVE NEGATIVE Final    Comment: (NOTE) Fact Sheet for Patients: BloggerCourse.com  Fact Sheet for Healthcare  Providers: SeriousBroker.it  This test is not yet approved or cleared by the [place]  FDA and has been authorized for detection and/or diagnosis of SARS-CoV-2 by FDA under an Emergency Use Authorization (EUA). This EUA will remain in effect (meaning this test can be used) for the duration of the COVID-19 declaration under Section 564(b)(1) of the Act, 21 U.S.C. section 360bbb-3(b)(1), unless the authorization is terminated or revoked.  Performed at [facility], [address] [place], [place] [address]   Respiratory (~20 pathogens) panel by PCR     Status: None   Collection Time: 02/05/24 10:40 PM   Specimen: Nasopharyngeal Swab; Respiratory  Result Value Ref Range Status   Adenovirus NOT DETECTED NOT DETECTED Final   Coronavirus 229E NOT DETECTED NOT DETECTED Final    Comment: (NOTE) The Coronavirus on the Respiratory Panel, DOES NOT test for the novel  Coronavirus (2019 nCoV)    Coronavirus HKU1 NOT DETECTED NOT DETECTED Final   Coronavirus NL63 NOT DETECTED NOT DETECTED Final   Coronavirus OC43 NOT DETECTED NOT DETECTED Final   Metapneumovirus NOT DETECTED NOT DETECTED Final   Rhinovirus / Enterovirus NOT DETECTED NOT DETECTED Final   Influenza A NOT DETECTED NOT DETECTED Final   Influenza B NOT DETECTED NOT DETECTED Final   Parainfluenza Virus 1 NOT DETECTED NOT DETECTED Final   Parainfluenza Virus 2 NOT DETECTED NOT DETECTED Final   Parainfluenza Virus 3 NOT DETECTED NOT DETECTED Final   Parainfluenza Virus 4 NOT DETECTED NOT DETECTED Final   Respiratory Syncytial Virus NOT DETECTED NOT DETECTED Final   Bordetella pertussis NOT DETECTED NOT DETECTED Final   Bordetella Parapertussis NOT DETECTED NOT DETECTED Final   Chlamydophila pneumoniae NOT DETECTED NOT DETECTED Final   Mycoplasma pneumoniae NOT DETECTED NOT DETECTED Final    Comment: Performed at [facility], [address] [place], [place] [address]  Culture, blood  (Routine X 2) w Reflex to ID Panel     Status: None (Preliminary result)  Collection Time: 02/07/24  7:33 PM   Specimen: BLOOD  Result Value Ref Range Status   Specimen Description BLOOD SITE NOT SPECIFIED  Final   Special Requests   Final    BOTTLES DRAWN AEROBIC ONLY Blood Culture results may not be optimal due to an inadequate volume of blood received in culture bottles   Culture   Final    NO GROWTH 4 DAYS Performed at [facility], [address], [place], [place] [address]    Report Status PENDING  Incomplete  Culture, blood (Routine X 2) w Reflex to ID Panel     Status: None (Preliminary result)   Collection Time: 02/07/24  7:38 PM   Specimen: BLOOD  Result Value Ref Range Status   Specimen Description BLOOD SITE NOT SPECIFIED  Final   Special Requests   Final    BOTTLES DRAWN AEROBIC ONLY Blood Culture results may not be optimal due to an inadequate volume of blood received in culture bottles   Culture   Final    NO GROWTH 4 DAYS Performed at [facility], [address], [place], [place] [address]    Report Status PENDING  Incomplete    I have personally spent 28 minutes involved in face-to-face and non-face-to-face activities for this patient on the day of the visit. Professional time spent includes the following activities: preparing to see the patient (review of tests), performing a medically appropriate examination, ordering medications, communicating with other health care professionals, documenting clinical information in the EMR, communicating results and counseling patient regarding medication and plan of care, and care coordination.    Cathlyn July, NP [facility]  02/11/2024  2:09 PM

## 2024-02-11 NOTE — Progress Notes (Signed)
 Physical Therapy Treatment Patient Details Name: Patricia White MRN: [id number] DOB: 1971/06/09 Today's Date: 02/11/2024   History of Present Illness 53 y.o. female presents to [facility] 02/05/24 due to breakthrough seizure 2/2 medication non-adherence. Pt also with SIRS. Prior admit 5/15 due to seizure. 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    PT Comments  Pt received in supine and agreeable to session. Pt perseverating on a snack and calling her family requiring redirection during session. Pt demonstrates improved command following and requires less assist this session. Pt requires grossly mod A +2 for bed mobility and transfers with pt demonstrating increased R lateral lean with increased fatigue. Pt able to correct lean with cues, achieving periods of CGA sitting EOB. Pt able to complete 3 stands from EOB with LLE extended forward despite assist with placement, however pt able to pull it back underneath her once standing with cues. Pt able to take 1 step forward with RLE, however is unable to follow cues for lateral steps despite physical assist to advance R foot. Pt continues to benefit from PT services to progress toward functional mobility goals.     If plan is discharge home, recommend the following: Help with stairs or ramp for entrance;A lot of help with walking and/or transfers;Supervision due to cognitive status;Assistance with cooking/housework;Assist for transportation   Can travel by private vehicle     No  Equipment Recommendations  Wheelchair (measurements PT);Wheelchair cushion (measurements PT)    Recommendations for Other Services       Precautions / Restrictions Precautions Precautions: Fall Recall of Precautions/Restrictions: Impaired Precaution/Restrictions Comments: seizure precs, L hemiparesis (congenital CP) Required Braces or Orthoses: Other Brace Other Brace: LLE AFO- not in room Restrictions Weight Bearing Restrictions Per Provider Order: No      Mobility  Bed Mobility Overal bed mobility: Needs Assistance Bed Mobility: Supine to Sit, Sit to Supine     Supine to sit: Mod assist, +2 for safety/equipment, +2 for physical assistance Sit to supine: Mod assist, +2 for physical assistance, +2 for safety/equipment   General bed mobility comments: mod A +2 to assist pt with BLEs and trunk control    Transfers Overall transfer level: Needs assistance Equipment used: 2 person hand held assist Transfers: Sit to/from Stand Sit to Stand: Mod assist, +2 physical assistance, +2 safety/equipment, Max assist           General transfer comment: Mod A to max A +2 for STS EOB, pt needing ext support to weight shift and producing R lean in standing. Difficulty offloading and coordinating BLEs. R knee blocked    Ambulation/Gait                   Stairs             Wheelchair Mobility     Tilt Bed    Modified Rankin (Stroke Patients Only)       Balance Overall balance assessment: Needs assistance Sitting-balance support: Feet supported, No upper extremity supported Sitting balance-Leahy Scale: Fair Sitting balance - Comments: Min A to CGA, cues to keep feet on the ground. Attempts to correct R lean when cued.   Standing balance support: Bilateral upper extremity supported, During functional activity Standing balance-Leahy Scale: Poor Standing balance comment: reliant on external support                            Communication Communication Communication: Impaired Factors Affecting Communication: Reduced clarity of speech;Difficulty  expressing self  Cognition Arousal: Alert Behavior During Therapy: Flat affect   PT - Cognitive impairments: History of cognitive impairments, Sequencing, Problem solving, Safety/Judgement, No family/caregiver present to determine baseline, Difficult to assess, Initiation, Awareness, Memory, Attention Difficult to assess due to: Impaired communication                      PT - Cognition Comments: History of intellectual disability at baseline. Following commands: Impaired Following commands impaired: Follows one step commands inconsistently, Follows one step commands with increased time    Cueing Cueing Techniques: Verbal cues, Gestural cues, Tactile cues, Visual cues  Exercises General Exercises - Lower Extremity Long Arc Quad: AROM, Both, 5 reps, Seated    General Comments General comments (skin integrity, edema, etc.): VSS. Pt continues to ask for onion chips as a snack      Pertinent Vitals/Pain Pain Assessment Pain Assessment: Faces Faces Pain Scale: Hurts little more Pain Location: B feet Pain Descriptors / Indicators: Discomfort Pain Intervention(s): Limited activity within patient's tolerance, Monitored during session     PT Goals (current goals can now be found in the care plan section) Acute Rehab PT Goals Patient Stated Goal: unable to state goal PT Goal Formulation: Patient unable to participate in goal setting Time For Goal Achievement: 02/21/24 Progress towards PT goals: Progressing toward goals    Frequency    Min 2X/week      PT Plan      Co-evaluation PT/OT/SLP Co-Evaluation/Treatment: Yes Reason for Co-Treatment: For patient/therapist safety;To address functional/ADL transfers;Necessary to address cognition/behavior during functional activity PT goals addressed during session: Mobility/safety with mobility;Balance        AM-PAC PT 6 Clicks Mobility   Outcome Measure  Help needed turning from your back to your side while in a flat bed without using bedrails?: Total Help needed moving from lying on your back to sitting on the side of a flat bed without using bedrails?: Total Help needed moving to and from a bed to a chair (including a wheelchair)?: Total Help needed standing up from a chair using your arms (e.g., wheelchair or bedside chair)?: Total Help needed to walk in hospital room?:  Total Help needed climbing 3-5 steps with a railing? : Total 6 Click Score: 6    End of Session Equipment Utilized During Treatment: Gait belt Activity Tolerance: Patient tolerated treatment well Patient left: in bed;with call bell/phone within reach;with bed alarm set Nurse Communication: Mobility status PT Visit Diagnosis: Other abnormalities of gait and mobility (R26.89);Hemiplegia and hemiparesis;Muscle weakness (generalized) (M62.81)     Time: 9072-9042 PT Time Calculation (min) (ACUTE ONLY): 30 min  Charges:    $Therapeutic Activity: 8-22 mins PT General Charges $$ ACUTE PT VISIT: 1 Visit                    Darryle George, PTA Acute Rehabilitation Services Secure Chat Preferred  [phone]    Darryle George 02/11/2024, 1:04 PM

## 2024-02-11 NOTE — NC FL2 (Signed)
 [place]  MEDICAID FL2 LEVEL OF CARE FORM     IDENTIFICATION  Patient Name: Patricia White Birthdate: 07/12/1971 Sex: female Admission Date (Current Location): 02/05/2024  [facility] and [place] Number:  Producer, television/film/video and Address:  [facility] [place], [address], [place], [place] [address]      Provider Number: [id number]  Attending Physician Name and Address:  Rosalynn Camie CROME, MD  Relative Name and Phone Number:       Current Level of Care: Hospital Recommended Level of Care: Skilled Nursing Facility Prior Approval Number:    Date Approved/Denied:   PASRR Number: [id number] F, Expires 05/01/24  Discharge Plan: SNF    Current Diagnoses: Patient Active Problem List   Diagnosis Date Noted   Bacteremia 02/07/2024   Sepsis (HCC) 02/06/2024   Blood per rectum 01/02/2024   Ulcer of upper extremity (HCC) 12/31/2023   Facial swelling 12/30/2023   Open wound of left upper arm 12/29/2023   Sinus tachycardia 12/29/2023   Pressure injury of skin of left elbow 12/29/2023   Intellectual disability 12/28/2023   Cerebral palsy (HCC) 12/28/2023   Infestation by bed bug 12/27/2023   Seizure-like activity (HCC) 12/27/2023   Left arm pain 11/24/2023   Hypotension 11/23/2023   Left arm swelling 11/23/2023   Status epilepticus (HCC) 11/19/2023   Pancytopenia (HCC) 11/11/2023   Hypomagnesemia 11/09/2023   Dehydration 11/08/2023   Gastritis and gastroduodenitis 11/08/2023   Gastric polyp 11/08/2023   Nausea without vomiting 11/08/2023   Protein-calorie malnutrition, severe 11/08/2023   Decreased appetite 11/05/2023   Lower urinary tract infectious disease 11/01/2023   Chronic health problem 11/01/2023   Oral thrush 11/01/2023   FTT (failure to thrive) in adult 11/01/2023   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   Hot flashes due to menopause 06/30/2021   Hyperlipidemia associated with type 2 diabetes mellitus (HCC) 11/24/2020   Diabetes mellitus without  complication (HCC) 11/23/2020   Angioedema 12/10/2019   Lesion of mandible 12/10/2019   Seasonal allergies 11/20/2019   Essential hypertension 10/23/2019   Seizure (HCC) 02/23/2016   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   Obesity (BMI 30.0-34.9) 10/13/2009   Infantile cerebral palsy (HCC) 10/11/2006    Orientation RESPIRATION BLADDER Height & Weight     Self, Place  Normal Incontinent Weight: 156 lb 8.4 oz (71 kg) Height:  5' 5 (165.1 cm)  BEHAVIORAL SYMPTOMS/MOOD NEUROLOGICAL BOWEL NUTRITION STATUS    Convulsions/Seizures Incontinent Diet (see DC summary)  AMBULATORY STATUS COMMUNICATION OF NEEDS Skin   Extensive Assist Verbally Other (Comment) (healing abrasion left elbow, no dressing)                       Personal Care Assistance Level of Assistance  Bathing, Feeding, Dressing Bathing Assistance: Maximum assistance Feeding assistance: Limited assistance Dressing Assistance: Maximum assistance     Functional Limitations Info  Sight, Speech Sight Info: Impaired   Speech Info: Impaired (dysarthria)    SPECIAL CARE FACTORS FREQUENCY  PT (By licensed PT), OT (By licensed OT)     PT Frequency: 5x/wk OT Frequency: 5x/wk            Contractures Contractures Info: Not present    Additional Factors Info  Code Status, Allergies, Psychotropic Code Status Info: DNR Allergies Info: NKA Psychotropic Info: Klonopin  0.5mg  3x/day         Current Medications (02/11/2024):  This is the current hospital active medication list Current Facility-Administered Medications  Medication Dose Route Frequency Provider Last Rate Last Admin   acetaminophen  (TYLENOL ) tablet 650 mg  650 mg Oral Q6H PRN Hammons, Suzen NOVAK, RPH       atorvastatin  (LIPITOR) tablet 40 mg  40 mg Oral Daily Hammons, Kimberly B, RPH   40 mg at 02/11/24 1007   clonazePAM  (KLONOPIN ) disintegrating tablet 0.5 mg  0.5 mg Oral TID [name], DO   0.5  mg at 02/11/24 1132   enoxaparin  (LOVENOX ) injection 40 mg  40 mg Subcutaneous Q24H [name], DO   40 mg at 02/11/24 1007   lacosamide  (VIMPAT ) tablet 200 mg  200 mg Oral BID [name], DO   200 mg at 02/11/24 0745   levETIRAcetam  (KEPPRA ) tablet 1,500 mg  1,500 mg Oral Q12H Hammons, Suzen NOVAK, RPH   1,500 mg at 02/11/24 0745   LORazepam  (ATIVAN ) injection 2 mg  2 mg Intravenous PRN Smoot, Sarah A, PA-C       Oral care mouth rinse  15 mL Mouth Rinse 4 times per day Rosalynn Camie CROME, MD   15 mL at 02/11/24 1132   Oral care mouth rinse  15 mL Mouth Rinse PRN Rosalynn Camie CROME, MD       pantoprazole  (PROTONIX ) EC tablet 40 mg  40 mg Oral Daily Hammons, Kimberly B, RPH   40 mg at 02/11/24 1007   perampanel  (FYCOMPA ) tablet 4 mg  4 mg Oral QHS [name], DO   4 mg at 02/10/24 2033   perflutren lipid microspheres (DEFINITY) IV suspension  1-10 mL Intravenous PRN Quillen, Michael, MD   1.5 mL at 02/11/24 1318   [START ON 02/12/2024] phenytoin  (DILANTIN ) chewable tablet 100 mg  100 mg Oral Daily Hammons, Kimberly B, RPH       And   phenytoin  (DILANTIN ) chewable tablet 150 mg  150 mg Oral QHS Hammons, Kimberly B, RPH       piperacillin -tazobactam (ZOSYN ) IVPB 3.375 g  3.375 g Intravenous Q8H Fleeta Rothman, Jomarie SAILOR, MD 12.5 mL/hr at 02/11/24 1324 3.375 g at 02/11/24 1324   polyethylene glycol (MIRALAX  / GLYCOLAX ) packet 17 g  17 g Oral Daily Quillen, Michael, MD   17 g at 02/11/24 1007   senna-docusate (Senokot-S) tablet 1 tablet  1 tablet Oral Daily Quillen, Michael, MD   1 tablet at 02/11/24 1007   sodium chloride  flush (NS) 0.9 % injection 10-40 mL  10-40 mL Intracatheter Q12H Rosalynn Camie CROME, MD   20 mL at 02/11/24 1008   sodium chloride  flush (NS) 0.9 % injection 10-40 mL  10-40 mL Intracatheter PRN Rosalynn Camie CROME, MD         Discharge Medications: Please see discharge summary for a list of discharge medications.  Relevant Imaging Results:  Relevant Lab Results:   Additional Information SS#:  [id number]  Almarie CHRISTELLA Goodie, LCSW

## 2024-02-11 NOTE — Progress Notes (Signed)
 Occupational Therapy Treatment Patient Details Name: Patricia White MRN: [id number] DOB: Oct 07, 1970 Today's Date: 02/11/2024   History of present illness 53 y.o. female presents to [facility] 02/05/24 due to breakthrough seizure 2/2 medication non-adherence. Pt also with SIRS. Prior admit 5/15 due to seizure. 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   OT comments  Pt making progress in acute OT sessions, more alert and following >70% of commands. Pt able to progress balance to CGA with cues for posture and increased AMPAC from 6 to 14 demonstrating improvements with ADL performance. She currently needs mod A +2 for bed mobility and standing attempts, requiring more assist with fatigue and to provide support with offloading BLEs. OT to continue to progress pt as able. DC plans remain appropriate for SNF.       If plan is discharge home, recommend the following:  Two people to help with walking and/or transfers;Assistance with cooking/housework;Direct supervision/assist for medications management;Direct supervision/assist for financial management;Assist for transportation;Supervision due to cognitive status;Help with stairs or ramp for entrance;Two people to help with bathing/dressing/bathroom   Equipment Recommendations  Other (comment) (defer to next level of care)    Recommendations for Other Services      Precautions / Restrictions Precautions Precautions: Fall Recall of Precautions/Restrictions: Impaired Precaution/Restrictions Comments: seizure precs, L hemiparesis (congenital CP) Required Braces or Orthoses: Other Brace Other Brace: LLE AFO- not in room Restrictions Weight Bearing Restrictions Per Provider Order: No       Mobility Bed Mobility Overal bed mobility: Needs Assistance Bed Mobility: Supine to Sit, Sit to Supine     Supine to sit: Mod assist, +2 for safety/equipment, +2 for physical assistance Sit to supine: Mod assist, +2 for physical assistance, +2 for  safety/equipment   General bed mobility comments: mod A +2 to assist pt with BLEs and trunk control    Transfers Overall transfer level: Needs assistance Equipment used: 2 person hand held assist Transfers: Sit to/from Stand Sit to Stand: Mod assist, +2 physical assistance, +2 safety/equipment (modA to max A +2)           General transfer comment: Mod A to max A +2 for STS EOB, pt needing ext support to weight shift and producing R lean in standing. Difficulty offloading and coordinating BLEs.     Balance Overall balance assessment: Needs assistance Sitting-balance support: Feet supported, No upper extremity supported Sitting balance-Leahy Scale: Fair Sitting balance - Comments: Min A to CGA, cues to keep feet on the ground. Attempts to correct R lean when cued. Postural control: Right lateral lean Standing balance support: Bilateral upper extremity supported, During functional activity Standing balance-Leahy Scale: Poor Standing balance comment: reliant on external support                           ADL either performed or assessed with clinical judgement   ADL   Eating/Feeding: Bed level;Set up   Grooming: Sitting;Wash/dry face;Contact guard assist (using RUE)   Upper Body Bathing: Moderate assistance;Sitting   Lower Body Bathing: Maximal assistance;Sitting/lateral leans   Upper Body Dressing : Moderate assistance;Sitting   Lower Body Dressing: Maximal assistance;Sit to/from stand;Sitting/lateral leans       Toileting- Clothing Manipulation and Hygiene: Maximal assistance;Sit to/from stand;+2 for physical assistance              Extremity/Trunk Assessment Upper Extremity Assessment Upper Extremity Assessment: Generalized weakness RUE Deficits / Details: RUE WFL for ROM and grip strength. LUE Deficits /  Details: Congential CP, L hemi, pt using elbow flexors/ext to reposition on command.       Cervical / Trunk Assessment Cervical / Trunk  Exceptions: flexed cervical posture.    Vision       Perception Perception Perception: Not tested   Praxis Praxis Praxis: Not tested   Communication Communication Communication: Impaired Factors Affecting Communication: Reduced clarity of speech;Difficulty expressing self   Cognition Arousal: Alert Behavior During Therapy: Flat affect Cognition: History of cognitive impairments     Awareness: Intellectual awareness impaired, Online awareness impaired   Attention impairment (select first level of impairment): Focused attention, Sustained attention Executive functioning impairment (select all impairments): Problem solving, Reasoning OT - Cognition Comments: pt with hx of developtemental disabilities at baseline                 Following commands: Impaired Following commands impaired: Follows one step commands inconsistently, Follows one step commands with increased time      Cueing   Cueing Techniques: Verbal cues, Gestural cues, Tactile cues, Visual cues  Exercises General Exercises - Lower Extremity Long Arc Quad: AROM, Both, 5 reps, Seated Other Exercises Other Exercises: dynamic reaching sitting EOB with RUE.    Shoulder Instructions       General Comments VSS. Pt continues to ask for onion chips as a snack    Pertinent Vitals/ Pain       Pain Assessment Pain Assessment: Faces Faces Pain Scale: Hurts little more Pain Location: Pt keeps reporting both feet hurt, she is unable to identify precise location and just states both Pain Descriptors / Indicators: Discomfort Pain Intervention(s): Monitored during session, Limited activity within patient's tolerance  Home Living                                          Prior Functioning/Environment              Frequency  Min 2X/week        Progress Toward Goals  OT Goals(current goals can now be found in the care plan section)  Progress towards OT goals: Progressing toward  goals  Acute Rehab OT Goals Patient Stated Goal: To get a snack, wants onion chips OT Goal Formulation: With patient Time For Goal Achievement: 02/21/24 Potential to Achieve Goals: Fair  Plan      Co-evaluation      Reason for Co-Treatment: For patient/therapist safety;To address functional/ADL transfers;Necessary to address cognition/behavior during functional activity PT goals addressed during session: Mobility/safety with mobility;Balance        AM-PAC OT 6 Clicks Daily Activity     Outcome Measure   Help from another person eating meals?: A Little Help from another person taking care of personal grooming?: A Little Help from another person toileting, which includes using toliet, bedpan, or urinal?: A Lot Help from another person bathing (including washing, rinsing, drying)?: A Lot Help from another person to put on and taking off regular upper body clothing?: A Lot Help from another person to put on and taking off regular lower body clothing?: A Lot 6 Click Score: 14    End of Session Equipment Utilized During Treatment: Gait belt  OT Visit Diagnosis: Unsteadiness on feet (R26.81);Other abnormalities of gait and mobility (R26.89);Pain;Muscle weakness (generalized) (M62.81) Pain - Right/Left:  (bilat) Pain - part of body: Ankle and joints of foot   Activity Tolerance Patient tolerated treatment well  Patient Left in bed;with call bell/phone within reach;with bed alarm set   Nurse Communication Mobility status        Time: 9068-9042 OT Time Calculation (min): 26 min  Charges: OT General Charges $OT Visit: 1 Visit OT Treatments $Therapeutic Activity: 8-22 mins  02/11/2024  AB, OTR/L  Acute Rehabilitation Services  Office: [phone]   [name] 02/11/2024, 10:55 AM

## 2024-02-11 NOTE — Plan of Care (Signed)

## 2024-02-11 NOTE — Assessment & Plan Note (Addendum)
 No seizure activity overnight.  Doing well this morning. - Keppra  1500 mg BID - Klonopin  0.5 mg TID - Vimpat  200 mg BID - Perampanel  4 mg daily - Phenytoin  100 mg AM and 150 mg in PM, recheck phenytoin  level on 7/7 - APS 6/26, awaiting guardianship - TOC consult for medication adherence - PT/OT to treat - Fall and seizure precautions - AM BMP

## 2024-02-11 NOTE — Progress Notes (Addendum)
     Daily Progress Note Intern Pager: [phone]  Patient name: Patricia White Medical record number: [id number] Date of birth: 1970/11/20 Age: 53 y.o. Gender: female  Primary Care Provider: Alba Sharper, MD Consultants: Neurology (signed off) Code Status: DNR-I  Pt Overview and Major Events to Date:  6/24: Admitted 6/27: Emergency Guardianship petitioned  Assessment and Plan: Patricia White is a 53 y.o. female with PMH of cerebral palsy, asthma, and seizures admitted for multiple breakthrough seizures secondary to medication non-administration.  APS report has been filed and guardianship is being pursued, awaiting update and safe disposition. Assessment & Plan Seizure (HCC) No seizure activity overnight.  Doing well this morning. - Keppra  1500 mg BID - Klonopin  0.5 mg TID - Vimpat  200 mg BID - Perampanel  4 mg daily - Phenytoin  100 mg AM and 150 mg in PM, recheck phenytoin  level on 7/7 - APS 6/26, awaiting guardianship - TOC consult for medication adherence - PT/OT to treat - Fall and seizure precautions - AM BMP Bacteremia Hemodynamically stable, afebrile. Unclear source. Repeat bcx no growth to date. - Vanc discontinued per ID (6/25 - 6/29) and Zosyn  for another day (6/25 - 7/1) - ID consulted, appreciated recs; signed off today - Tylenol  prn for fever, likely secondary to seizures Chronic health problem HLD: Atorvastatin  40 mg GERD: Protonix  40 mg  FEN/GI: Dysphagia 3 diet PPx: Lovenox  Dispo: Pending guardianship petition approval  Subjective:  Patient is doing well this morning.  Smiling on exam.  No concerns at this time other than some pain in her legs, likely due to sitting in bed for prolonged period of time.  Mobility team at bedside to work with her.  Objective: Temp:  [97.7 F (36.5 C)-98.8 F (37.1 C)] 98.7 F (37.1 C) (06/30 1116) Pulse Rate:  [87-108] 107 (06/30 1116) Resp:  [16-20] 20 (06/30 1116) BP: (107-136)/(72-89) 107/79 (06/30  1116) SpO2:  [97 %-100 %] 99 % (06/30 1116) Physical Exam: General: Awake and Alert in NAD HEENT: NCAT. Sclera anicteric. No rhinorrhea. Cardiovascular: RRR. No M/R/G Respiratory: CTAB, normal WOB on RA. No wheezing, crackles, rhonchi, or diminished breath sounds. Abdomen: Soft, non-tender, non-distended. Bowel sounds normoactive Extremities: Chronic left-sided weakness. No BLE edema, no deformities or significant joint findings. Skin: Warm and dry. No abrasions or rashes noted. Neuro: A&Ox3 (name, location, and month). No focal neurological deficits.  Laboratory: Most recent CBC Lab Results  Component Value Date   WBC 7.6 02/08/2024   HGB 10.8 (L) 02/08/2024   HCT 34.9 (L) 02/08/2024   MCV 90.4 02/08/2024   PLT 455 (H) 02/08/2024   Most recent BMP    Latest Ref Rng & Units 02/11/2024    7:40 AM  BMP  Glucose 70 - 99 mg/dL 887   BUN 6 - 20 mg/dL 5   Creatinine 9.55 - 8.99 mg/dL 9.21   Sodium 864 - 854 mmol/L 137   Potassium 3.5 - 5.1 mmol/L 3.5   Chloride 98 - 111 mmol/L 100   CO2 22 - 32 mmol/L 25   Calcium  8.9 - 10.3 mg/dL 8.9    NGTD on new blood culture  Imaging/Diagnostic Tests: No new imaging.  Janna Ferrier, DO 02/11/2024, 2:44 PM  PGY-1, [facility] FPTS Intern pager: [phone], text pages welcome Secure chat group [facility] Teaching Service

## 2024-02-11 NOTE — Assessment & Plan Note (Addendum)
 Hemodynamically stable, afebrile. Unclear source. Repeat bcx no growth to date. - Vanc discontinued per ID (6/25 - 6/29) and Zosyn  for another day (6/25 - 7/1) - ID consulted, appreciated recs; signed off today - Tylenol  prn for fever, likely secondary to seizures

## 2024-02-11 NOTE — Assessment & Plan Note (Signed)
 HLD: Atorvastatin  40 mg GERD: Protonix  40 mg

## 2024-02-12 DIAGNOSIS — R7881 Bacteremia: Secondary | ICD-10-CM | POA: Diagnosis not present

## 2024-02-12 DIAGNOSIS — R569 Unspecified convulsions: Secondary | ICD-10-CM | POA: Diagnosis not present

## 2024-02-12 LAB — BASIC METABOLIC PANEL WITH GFR
Anion gap: 9 (ref 5–15)
BUN: <5 mg/dL — ABNORMAL LOW (ref 6–20)
CO2: 24 mmol/L (ref 22–32)
Calcium: 8.3 mg/dL — ABNORMAL LOW (ref 8.9–10.3)
Chloride: 107 mmol/L (ref 98–111)
Creatinine, Ser: 0.71 mg/dL (ref 0.44–1.00)
GFR, Estimated: >60 mL/min (ref >60)
Glucose, Bld: 159 mg/dL — ABNORMAL HIGH (ref 70–99)
Potassium: 2.9 mmol/L — ABNORMAL LOW (ref 3.5–5.1)
Sodium: 140 mmol/L (ref 135–145)

## 2024-02-12 LAB — CULTURE, BLOOD (ROUTINE X 2)
Culture: NO GROWTH
Culture: NO GROWTH

## 2024-02-12 MED ORDER — ROSUVASTATIN CALCIUM 20 MG PO TABS
20.0000 mg | ORAL_TABLET | Freq: Every day | ORAL | Status: DC
  Administered 2024-02-12 – 2024-02-20 (×9): 20 mg via ORAL
  Filled 2024-02-12 (×9): qty 1

## 2024-02-12 MED ORDER — POTASSIUM CHLORIDE 20 MEQ PO PACK
40.0000 meq | PACK | Freq: Two times a day (BID) | ORAL | Status: AC
  Administered 2024-02-12 (×2): 40 meq via ORAL
  Filled 2024-02-12 (×2): qty 2

## 2024-02-12 NOTE — Progress Notes (Signed)
     Daily Progress Note Intern Pager: [phone]  Patient name: [name] Medical record number: [id number] Date of birth: 10/19/1970 Age: 53 y.o. Gender: female  Primary Care Provider: Alba Sharper, MD Consultants: neurology s/o Code Status: Full code   Pt Overview and Major Events to Date:  6/24: Admitted  6/27: Emergency guardianship petitioned   Assessment and Plan:  Patricia White is a 53 y.o. female presenting for multiple seizures 2/2 to medication non-administration by caregivers. She is medically stable but awaiting safe disposition. Pertinent PMH/PSH includes intellectual disability, seizures, cerebral palsy.  Assessment & Plan Seizure (HCC) Stable - Keppra  1500 mg BID - Klonopin  0.5 mg TID - Vimpat  200 mg BID - Perampanel  4 mg daily - Phenytoin  100 mg AM and 150 mg in PM, recheck phenytoin  level on 7/7 - APS 6/26, awaiting guardianship - PT/OT to treat - Fall and seizure precautions - Lab holiday tomorrow; consider labs q3days Bacteremia Afebrile. Source never identified. Repeat bcx no growth to date. ID s/o 6/30 recommending to trend last Bcx collected 6/26.  - S/PVanc (6/25 - 6/29) and Zosyn  (6/25 - 7/1) - Tylenol  prn for fever Chronic health problem HLD: Atorvastatin  40 mg GERD: Protonix  40 mg   FEN/GI: dysphagia 3 diet PPx: Lovenox   Dispo:SNF/LTC pending safe disposition. Barriers include guardianship petition.   Subjective:  No acute events overnight. Resting comfortably this morning.   Objective: Temp:  [97.8 F (36.6 C)-98.7 F (37.1 C)] 98.3 F (36.8 C) (07/01 0329) Pulse Rate:  [87-107] 97 (07/01 0329) Resp:  [16-20] 18 (07/01 0329) BP: (106-135)/(71-85) 118/80 (07/01 0329) SpO2:  [97 %-100 %] 99 % (07/01 0329) Physical Exam: General: Chronically ill-appearing, no acute distress Cardio: RRR, no murmur on exam Pulm: clear, No increased work of breathing Abdomen: Soft, bowel sounds present, nontender Extremity: No peripheral  edema   Laboratory: Most recent CBC Lab Results  Component Value Date   WBC 7.6 02/08/2024   HGB 10.8 (L) 02/08/2024   HCT 34.9 (L) 02/08/2024   MCV 90.4 02/08/2024   PLT 455 (H) 02/08/2024   Most recent BMP    Latest Ref Rng & Units 02/11/2024    7:40 AM  BMP  Glucose 70 - 99 mg/dL 887   BUN 6 - 20 mg/dL 5   Creatinine 9.55 - 8.99 mg/dL 9.21   Sodium 864 - 854 mmol/L 137   Potassium 3.5 - 5.1 mmol/L 3.5   Chloride 98 - 111 mmol/L 100   CO2 22 - 32 mmol/L 25   Calcium  8.9 - 10.3 mg/dL 8.9     Imaging/Diagnostic Tests: No new imaging   [name], DO 02/12/2024, 6:14 AM  PGY-3, [place] [facility] FPTS Intern pager: [phone], text pages welcome Secure chat group [facility] Teaching Service

## 2024-02-12 NOTE — TOC Progression Note (Signed)
 Transition of Care [facility]) - Progression Note    Patient Details  Name: Patricia White MRN: [id number] Date of Birth: [birth year]  Transition of Care [facility]) CM/SW Contact  Almarie CHRISTELLA Goodie, [place] Phone Number: 02/12/2024, 3:36 PM  Clinical Narrative:   CSW spoke with DSS, they were granted ex parte (temporary emergency guardianship) over the patient. DSS will send CSW a copy of paperwork to be placed on the patient's chart, and CSW will send DSS the bed offers for SNF once paperwork is received. CSW provided update to MD. CSW to follow.    Expected Discharge Plan: Skilled Nursing Facility    Expected Discharge Plan and Services                                               Social Determinants of Health (SDOH) Interventions SDOH Screenings   Food Insecurity: No Food Insecurity (02/06/2024)  Housing: Low Risk  (02/06/2024)  Transportation Needs: Patient Unable To Answer (02/06/2024)  Utilities: Not At Risk (02/06/2024)  Alcohol Screen: Low Risk  (03/24/2023)  Depression (PHQ2-9): Low Risk  (08/09/2023)  Financial Resource Strain: Low Risk  (03/24/2023)  Physical Activity: Insufficiently Active (03/24/2023)  Social Connections: Moderately Isolated (11/19/2023)  Stress: No Stress Concern Present (03/24/2023)  Tobacco Use: Low Risk  (02/05/2024)  Health Literacy: Adequate Health Literacy (03/24/2023)    Readmission Risk Interventions    11/16/2023   11:48 AM  Readmission Risk Prevention Plan  Transportation Screening Complete  PCP or Specialist Appt within 5-7 Days Complete  Home Care Screening Complete  Medication Review (RN CM) Complete

## 2024-02-12 NOTE — Assessment & Plan Note (Signed)
 Afebrile. Source never identified. Repeat bcx no growth to date. ID s/o 6/30 recommending to trend last Bcx collected 6/26.  - S/PVanc (6/25 - 6/29) and Zosyn  (6/25 - 7/1) - Tylenol  prn for fever

## 2024-02-12 NOTE — Assessment & Plan Note (Signed)
 HLD: Atorvastatin  40 mg GERD: Protonix  40 mg

## 2024-02-12 NOTE — Assessment & Plan Note (Signed)
 Stable - Keppra  1500 mg BID - Klonopin  0.5 mg TID - Vimpat  200 mg BID - Perampanel  4 mg daily - Phenytoin  100 mg AM and 150 mg in PM, recheck phenytoin  level on 7/7 - APS 6/26, awaiting guardianship - PT/OT to treat - Fall and seizure precautions - Lab holiday tomorrow; consider labs q3days

## 2024-02-13 DIAGNOSIS — R569 Unspecified convulsions: Secondary | ICD-10-CM | POA: Diagnosis not present

## 2024-02-13 LAB — BASIC METABOLIC PANEL WITH GFR
Anion gap: 10 (ref 5–15)
BUN: 5 mg/dL — ABNORMAL LOW (ref 6–20)
CO2: 26 mmol/L (ref 22–32)
Calcium: 9.2 mg/dL (ref 8.9–10.3)
Chloride: 104 mmol/L (ref 98–111)
Creatinine, Ser: 0.71 mg/dL (ref 0.44–1.00)
GFR, Estimated: >60 mL/min (ref >60)
Glucose, Bld: 125 mg/dL — ABNORMAL HIGH (ref 70–99)
Potassium: 3.6 mmol/L (ref 3.5–5.1)
Sodium: 140 mmol/L (ref 135–145)

## 2024-02-13 NOTE — Assessment & Plan Note (Signed)
 Afebrile. Source never identified. Repeat bcx no growth to date. ID s/o 6/30 recommending to trend last Bcx collected 6/26.  - S/PVanc (6/25 - 6/29) and Zosyn  (6/25 - 7/1) - Tylenol  prn for fever

## 2024-02-13 NOTE — Progress Notes (Signed)
     Daily Progress Note Intern Pager: [phone]  Patient name: Patricia White Medical record number: [id number] Date of birth: [birth year] Age: 53 y.o. Gender: female  Primary Care Provider: Alba Sharper, MD Consultants: Neurology (signed-off) Code Status: DNR-I  Pt Overview and Major Events to Date:  6/24: Admitted 6/27: Emergency Guardianship petitioned   Medical Decision Making:  Patricia White is a 53 y.o. female admitted for multiple breakthrough seizures secondary to medication non-administration. Pertinent PMH/PSH includes cerebral palsy, asthma, and seizures. APS report has been filed and guardianship is being pursued, awaiting update and safe disposition. Assessment & Plan Seizure (HCC) Stable - Keppra  1500 mg BID - Klonopin  0.5 mg TID - Vimpat  200 mg BID - Perampanel  4 mg daily - Phenytoin  100 mg AM and 150 mg in PM, recheck phenytoin  level on 7/7 - APS 6/26, awaiting guardianship - PT/OT to treat - Fall and seizure precautions - Lab holiday tomorrow; consider labs q3days Bacteremia Afebrile. Source never identified. Repeat bcx no growth to date. ID s/o 6/30 recommending to trend last Bcx collected 6/26.  - S/PVanc (6/25 - 6/29) and Zosyn  (6/25 - 7/1) - Tylenol  prn for fever Chronic health problem HLD: Atorvastatin  40 mg GERD: Protonix  40 mg     FEN/GI: Dysphagia 3 diet PPx: Lovenox  Dispo:SNF/LTC safe disposition. Barriers include guardianship petition.   Subjective:  No acute events overnight. Patient was sitting up comfortably in bed this morning.  Objective: Temp:  [98 F (36.7 C)-99.2 F (37.3 C)] 98.4 F (36.9 C) (07/02 0732) Pulse Rate:  [45-107] 99 (07/02 0732) Resp:  [16-19] 17 (07/02 0732) BP: (99-134)/(75-99) 99/75 (07/02 0732) SpO2:  [96 %-100 %] 100 % (07/02 0732) Physical Exam: General: Chronically-ill appearing, no acute distress Cardiovascular: RRR, no M/R/G Respiratory: clear to auscultation anteriorly, normal work of  breathing  Abdomen: soft, flat, non-tender, bowel sounds present Extremities: no peripheral edema  Laboratory: Most recent CBC Lab Results  Component Value Date   WBC 7.6 02/08/2024   HGB 10.8 (L) 02/08/2024   HCT 34.9 (L) 02/08/2024   MCV 90.4 02/08/2024   PLT 455 (H) 02/08/2024   Most recent BMP    Latest Ref Rng & Units 02/13/2024    4:55 AM  BMP  Glucose 70 - 99 mg/dL 874   BUN 6 - 20 mg/dL 5   Creatinine 9.55 - 8.99 mg/dL 9.28   Sodium 864 - 854 mmol/L 140   Potassium 3.5 - 5.1 mmol/L 3.6   Chloride 98 - 111 mmol/L 104   CO2 22 - 32 mmol/L 26   Calcium  8.9 - 10.3 mg/dL 9.2    Imaging/Diagnostic Tests: No new imaging  Patricia Raguel MATSU, DO 02/13/2024, 8:22 AM  PGY-1, [place] [facility] FPTS Intern pager: [phone], text pages welcome Secure chat group [facility] Teaching Service

## 2024-02-13 NOTE — Assessment & Plan Note (Signed)
 Stable - Keppra  1500 mg BID - Klonopin  0.5 mg TID - Vimpat  200 mg BID - Perampanel  4 mg daily - Phenytoin  100 mg AM and 150 mg in PM, recheck phenytoin  level on 7/7 - APS 6/26, awaiting guardianship - PT/OT to treat - Fall and seizure precautions - Lab holiday tomorrow; consider labs q3days

## 2024-02-13 NOTE — Progress Notes (Signed)
 Physical Therapy Treatment Patient Details Name: Patricia White MRN: [id number] DOB: Aug 30, 1970 Today's Date: 02/13/2024   History of Present Illness 53 y.o. female presents to [facility] 02/05/24 due to breakthrough seizure 2/2 medication non-adherence. Pt also with SIRS. Prior admit 5/15 due to seizure. 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    PT Comments  Pt received in supine and agreeable to session. Pt able to sit to EOB with mod A +2 and maintain sitting balance with CGA and cues. Pt able to participate in limited BLE exercises with increased cues for initiation.  Pt focused on wanting a soda can and reached outside BOS to grab it without LOB. Attempted standing transfer with max A +2. Pt demonstrates poor standing tolerance and states pain all over and declines further attempts. Pt continues to benefit from PT services to progress toward functional mobility goals.    If plan is discharge home, recommend the following: Help with stairs or ramp for entrance;A lot of help with walking and/or transfers;Supervision due to cognitive status;Assistance with cooking/housework;Assist for transportation   Can travel by private vehicle     No  Equipment Recommendations  Wheelchair (measurements PT);Wheelchair cushion (measurements PT)    Recommendations for Other Services       Precautions / Restrictions Precautions Precautions: Fall Recall of Precautions/Restrictions: Impaired Precaution/Restrictions Comments: seizure precs, L hemiparesis (congenital CP) Required Braces or Orthoses: Other Brace Other Brace: LLE AFO- not in room Restrictions Weight Bearing Restrictions Per Provider Order: No     Mobility  Bed Mobility Overal bed mobility: Needs Assistance Bed Mobility: Supine to Sit, Sit to Supine     Supine to sit: Mod assist, +2 for safety/equipment, +2 for physical assistance Sit to supine: Mod assist, +2 for physical assistance, +2 for safety/equipment   General bed  mobility comments: mod A +2 to assist pt with BLEs and trunk control. increased cues for initiation    Transfers Overall transfer level: Needs assistance Equipment used: 2 person hand held assist Transfers: Sit to/from Stand Sit to Stand: Max assist, +2 physical assistance           General transfer comment: max A +2 for power up and stability with pt sitting back to EOB due to poor tolerance.    Ambulation/Gait               General Gait Details: unable   Stairs             Wheelchair Mobility     Tilt Bed    Modified Rankin (Stroke Patients Only)       Balance Overall balance assessment: Needs assistance Sitting-balance support: Feet supported, No upper extremity supported Sitting balance-Leahy Scale: Fair Sitting balance - Comments: sitting EOB with CGA   Standing balance support: Bilateral upper extremity supported, During functional activity, Reliant on assistive device for balance Standing balance-Leahy Scale: Poor Standing balance comment: reliant on external support                            Communication Communication Communication: Impaired Factors Affecting Communication: Reduced clarity of speech;Difficulty expressing self  Cognition Arousal: Alert Behavior During Therapy: Flat affect   PT - Cognitive impairments: History of cognitive impairments, Sequencing, Problem solving, Safety/Judgement, No family/caregiver present to determine baseline, Difficult to assess, Initiation, Awareness, Memory, Attention Difficult to assess due to: Impaired communication  PT - Cognition Comments: Pt tends to perseverate on things, such as calling her family and wanting the soda can instead of cup, requiring redirection throughout session Following commands: Impaired Following commands impaired: Follows one step commands inconsistently, Follows one step commands with increased time    Cueing Cueing Techniques:  Verbal cues, Gestural cues, Tactile cues, Visual cues  Exercises General Exercises - Lower Extremity Long Arc Quad: AROM, Both, 5 reps, Seated    General Comments        Pertinent Vitals/Pain Pain Assessment Pain Assessment: Faces Faces Pain Scale: Hurts a little bit Pain Location: all over Pain Descriptors / Indicators: Discomfort Pain Intervention(s): Limited activity within patient's tolerance, Monitored during session, Repositioned     PT Goals (current goals can now be found in the care plan section) Acute Rehab PT Goals Patient Stated Goal: unable to state goal PT Goal Formulation: Patient unable to participate in goal setting Time For Goal Achievement: 02/21/24 Progress towards PT goals: Progressing toward goals    Frequency    Min 2X/week       AM-PAC PT 6 Clicks Mobility   Outcome Measure  Help needed turning from your back to your side while in a flat bed without using bedrails?: A Lot Help needed moving from lying on your back to sitting on the side of a flat bed without using bedrails?: Total Help needed moving to and from a bed to a chair (including a wheelchair)?: Total Help needed standing up from a chair using your arms (e.g., wheelchair or bedside chair)?: Total Help needed to walk in hospital room?: Total Help needed climbing 3-5 steps with a railing? : Total 6 Click Score: 7    End of Session Equipment Utilized During Treatment: Gait belt Activity Tolerance: Patient tolerated treatment well;Patient limited by fatigue Patient left: in bed;with call bell/phone within reach;with bed alarm set Nurse Communication: Mobility status PT Visit Diagnosis: Other abnormalities of gait and mobility (R26.89);Hemiplegia and hemiparesis;Muscle weakness (generalized) (M62.81) Hemiplegia - Right/Left: Left Hemiplegia - dominant/non-dominant: Non-dominant Hemiplegia - caused by: Unspecified     Time: 8553-8495 PT Time Calculation (min) (ACUTE ONLY): 18  min  Charges:    $Therapeutic Activity: 8-22 mins PT General Charges $$ ACUTE PT VISIT: 1 Visit                     Darryle George, PTA Acute Rehabilitation Services Secure Chat Preferred  [phone]    Darryle George 02/13/2024, 4:02 PM

## 2024-02-13 NOTE — Plan of Care (Signed)

## 2024-02-13 NOTE — Assessment & Plan Note (Signed)
 HLD: Atorvastatin  40 mg GERD: Protonix  40 mg

## 2024-02-14 DIAGNOSIS — G40901 Epilepsy, unspecified, not intractable, with status epilepticus: Secondary | ICD-10-CM | POA: Diagnosis not present

## 2024-02-14 LAB — BASIC METABOLIC PANEL WITH GFR
Anion gap: 8 (ref 5–15)
BUN: <5 mg/dL — ABNORMAL LOW (ref 6–20)
CO2: 24 mmol/L (ref 22–32)
Calcium: 8.8 mg/dL — ABNORMAL LOW (ref 8.9–10.3)
Chloride: 104 mmol/L (ref 98–111)
Creatinine, Ser: 0.97 mg/dL (ref 0.44–1.00)
GFR, Estimated: >60 mL/min (ref >60)
Glucose, Bld: 98 mg/dL (ref 70–99)
Potassium: 3.5 mmol/L (ref 3.5–5.1)
Sodium: 136 mmol/L (ref 135–145)

## 2024-02-14 LAB — LIPID PANEL
Cholesterol: 100 mg/dL (ref 0–200)
HDL: 40 mg/dL — ABNORMAL LOW (ref >40)
LDL Cholesterol: 46 mg/dL (ref 0–99)
Total CHOL/HDL Ratio: 2.5 ratio
Triglycerides: 69 mg/dL (ref <150)
VLDL: 14 mg/dL (ref 0–40)

## 2024-02-14 NOTE — TOC Progression Note (Signed)
 Transition of Care [facility]) - Progression Note    Patient Details  Name: Patricia White MRN: [id number] Date of Birth: July 29, 1971  Transition of Care [facility]) CM/SW Contact  Almarie CHRISTELLA Goodie, [place] Phone Number: 02/14/2024, 3:31 PM  Clinical Narrative:   CSW spoke with Etha Chang with APS and provided bed offers, awaiting SNF choice. Awaiting paperwork that documents temporary guardianship, as well. CSW to follow.    Expected Discharge Plan: Skilled Nursing Facility    Expected Discharge Plan and Services                                               Social Determinants of Health (SDOH) Interventions SDOH Screenings   Food Insecurity: No Food Insecurity (02/06/2024)  Housing: Low Risk  (02/06/2024)  Transportation Needs: Patient Unable To Answer (02/06/2024)  Utilities: Not At Risk (02/06/2024)  Alcohol Screen: Low Risk  (03/24/2023)  Depression (PHQ2-9): Low Risk  (08/09/2023)  Financial Resource Strain: Low Risk  (03/24/2023)  Physical Activity: Insufficiently Active (03/24/2023)  Social Connections: Moderately Isolated (11/19/2023)  Stress: No Stress Concern Present (03/24/2023)  Tobacco Use: Low Risk  (02/05/2024)  Health Literacy: Adequate Health Literacy (03/24/2023)    Readmission Risk Interventions    11/16/2023   11:48 AM  Readmission Risk Prevention Plan  Transportation Screening Complete  PCP or Specialist Appt within 5-7 Days Complete  Home Care Screening Complete  Medication Review (RN CM) Complete

## 2024-02-14 NOTE — Assessment & Plan Note (Signed)
 HLD: Atorvastatin  40 mg GERD: Protonix  40 mg

## 2024-02-14 NOTE — Progress Notes (Signed)
     Daily Progress Note Intern Pager: [phone]  Patient name: Patricia White Medical record number: [id number] Date of birth: 13-Aug-1971 Age: 53 y.o. Gender: female  Primary Care Provider: Alba Sharper, MD Consultants: Neurology (signed-off) Code Status: DNR-I  Pt Overview and Major Events to Date:  6/24: Admitted 6/27: Emergency guardianship petitioned  Medical Decision Making:  Patricia White is a 53 y.o. female admitted for multiple breakthrough seizures secondary to medication non-administration. Pertinent PMH/PSH includes cerebral palsy, asthma, and seizures. APS report filed and guardianship is being pursued, awaiting update and safe disposition.  Assessment & Plan Seizure (HCC) Stable - Keppra  1500 mg BID - Klonopin  0.5 mg TID - Vimpat  200 mg BID - Perampanel  4 mg daily - Phenytoin  100 mg AM and 150 mg in PM, recheck phenytoin  level on 7/7 - APS 6/26, awaiting guardianship - PT/OT to treat - Fall and seizure precautions - Lab holiday tomorrow, labs weekly starting Monday 7/7 Bacteremia (Resolved: 02/14/2024) Afebrile. Source never identified. Repeat bcx no growth to date. ID s/o 6/30 recommending to trend last Bcx collected 6/26.  - S/PVanc (6/25 - 6/29) and Zosyn  (6/25 - 7/1) - Tylenol  prn for fever Chronic health problem HLD: Atorvastatin  40 mg GERD: Protonix  40 mg    FEN/GI: Dysphagia 3 diet PPx: Lovenox  and SCDs Dispo:SNF/LTC safe disposition. Barriers include guardianship petition.   Subjective:  No acute events overnight. Patient was sitting comfortably in bed this morning eating breakfast.   Objective: Temp:  [97.9 F (36.6 C)-98.9 F (37.2 C)] 97.9 F (36.6 C) (07/03 0754) Pulse Rate:  [83-107] 83 (07/03 0754) Resp:  [16-19] 16 (07/03 0754) BP: (106-127)/(77-93) 106/80 (07/03 0754) SpO2:  [96 %-100 %] 98 % (07/03 0754) Physical Exam: General: Chronically-ill appearing, NAD Cardiovascular: RRR, no M/R/G Respiratory: clear to auscultation  anteriorly, normal work of breathing on room air Abdomen: soft, flat, non-tender, bowel sounds present Extremities: no peripheral edema, SCDs in place  Laboratory: Most recent CBC Lab Results  Component Value Date   WBC 7.6 02/08/2024   HGB 10.8 (L) 02/08/2024   HCT 34.9 (L) 02/08/2024   MCV 90.4 02/08/2024   PLT 455 (H) 02/08/2024   Most recent BMP    Latest Ref Rng & Units 02/14/2024    4:35 AM  BMP  Glucose 70 - 99 mg/dL 98   BUN 6 - 20 mg/dL <5   Creatinine 9.55 - 1.00 mg/dL 9.02   Sodium 864 - 854 mmol/L 136   Potassium 3.5 - 5.1 mmol/L 3.5   Chloride 98 - 111 mmol/L 104   CO2 22 - 32 mmol/L 24   Calcium  8.9 - 10.3 mg/dL 8.8      Imaging/Diagnostic Tests: No new imaging  Patricia Raguel MATSU, DO 02/14/2024, 10:29 AM  PGY-1, [place] [facility] FPTS Intern pager: [phone], text pages welcome Secure chat group [facility] Teaching Service

## 2024-02-14 NOTE — Plan of Care (Signed)
  Problem: Education: Goal: Knowledge of General Education information will improve Description: Including pain rating scale, medication(s)/side effects and non-pharmacologic comfort measures Outcome: Not Applicable   Problem: Health Behavior/Discharge Planning: Goal: Ability to manage health-related needs will improve Outcome: Not Applicable

## 2024-02-14 NOTE — Assessment & Plan Note (Addendum)
 Stable - Keppra  1500 mg BID - Klonopin  0.5 mg TID - Vimpat  200 mg BID - Perampanel  4 mg daily - Phenytoin  100 mg AM and 150 mg in PM, recheck phenytoin  level on 7/7 - APS 6/26, awaiting guardianship - PT/OT to treat - Fall and seizure precautions - Lab holiday tomorrow, labs weekly starting Monday 7/7

## 2024-02-14 NOTE — Assessment & Plan Note (Signed)
 Afebrile. Source never identified. Repeat bcx no growth to date. ID s/o 6/30 recommending to trend last Bcx collected 6/26.  - S/PVanc (6/25 - 6/29) and Zosyn  (6/25 - 7/1) - Tylenol  prn for fever

## 2024-02-14 NOTE — Progress Notes (Signed)
 OT Cancellation Note  Patient Details Name: Patricia White MRN: [id number] DOB: 01-May-1971   Cancelled Treatment:    Reason Eval/Treat Not Completed: Patient declined, no reason specified (Spent 10 mins attempting to get pt to engage in therapy and explained the benefits of mobility. Pt at one point agreeable to session with bed level exercise, following 3 reps she repeatedly refused further engagement.) Continued to request covers be placed back and no longer following prompts to continue with exercises. OT to follow-up with pt as able.   02/14/2024  AB, OTR/L  Acute Rehabilitation Services  Office: [phone]   [name] 02/14/2024, 3:40 PM

## 2024-02-15 DIAGNOSIS — G40901 Epilepsy, unspecified, not intractable, with status epilepticus: Secondary | ICD-10-CM | POA: Diagnosis not present

## 2024-02-15 NOTE — Progress Notes (Shared)
     Daily Progress Note Intern Pager: [phone]  Patient name: Patricia White Medical record number: [id number] Date of birth: [birth year] Age: 53 y.o. Gender: female  Primary Care Provider: Alba Sharper, MD Consultants: Neurology (signed off) Code Status: DNR-I  Pt Overview and Major Events to Date:  6/24: Admitted 6/27: Emergency guardianship petition  Medical Decision Making:  Patient is a 53 year old female admitted for multiple breakthrough seizures secondary to inconsistent medication administration. Pertinent PMH/PSH includes cerebral palsy, asthma, seizures.  Patient medically stable for discharge, awaiting safe disposition.  APS report filed to pursue guardianship petition.  Hoping to hear back from DSS on Monday.  Assessment & Plan Seizure [facility]) Patient stable on current seizure regimen. - Keppra  1500 mg twice daily -Klonopin  0.5 mg 3 times daily - Vimpat  200 mg twice daily - Perampanel  4 mg daily - Phenytoin  100 mg a.m. and 150 mg p.m., recheck phenytoin  level 7/7 - APS 6/26, awaiting guardianship -- PT/OT to treat -Fall and seizure precautions - Weekly labs starting Monday 7/7  Chronic health problem HLD: Atorvastatin  40 mg GERD: Protonix  40 mg   FEN/GI: Dysphagia 3 diet PPx: Lovenox , SCDs Dispo:SNF/LTC safe disposition. Barriers include guardianship petition.   Subjective:  Patient lying comfortably in bed. NAEON, reports no issues this AM.   Objective: Temp:  [98 F (36.7 C)-98.5 F (36.9 C)] 98 F (36.7 C) (07/04 1925) Pulse Rate:  [92-107] 104 (07/04 1925) Resp:  [16-18] 18 (07/04 1925) BP: (101-117)/(57-86) 101/57 (07/04 1925) SpO2:  [95 %-100 %] 100 % (07/04 1925) Physical Exam: General: Alert and communicating, no acute distress.  Cardiovascular: RRR, s1/s2 present, no m/r/g Respiratory: CTA b/l with intermittent upper airway sounds  Abdomen: Soft, non tender, non distended  Laboratory: Most recent CBC Lab Results  Component Value  Date   WBC 7.6 02/08/2024   HGB 10.8 (L) 02/08/2024   HCT 34.9 (L) 02/08/2024   MCV 90.4 02/08/2024   PLT 455 (H) 02/08/2024   Most recent BMP    Latest Ref Rng & Units 02/14/2024    4:35 AM  BMP  Glucose 70 - 99 mg/dL 98   BUN 6 - 20 mg/dL <5   Creatinine 9.55 - 1.00 mg/dL 9.02   Sodium 864 - 854 mmol/L 136   Potassium 3.5 - 5.1 mmol/L 3.5   Chloride 98 - 111 mmol/L 104   CO2 22 - 32 mmol/L 24   Calcium  8.9 - 10.3 mg/dL 8.8     Manon Jester, DO 02/15/2024, 8:46 PM  PGY-1, [facility] FPTS Intern pager: [phone], text pages welcome Secure chat group [facility] Teaching Service

## 2024-02-15 NOTE — Assessment & Plan Note (Signed)
 Stable - Keppra  1500 mg BID - Klonopin  0.5 mg TID - Vimpat  200 mg BID - Perampanel  4 mg daily - Phenytoin  100 mg AM and 150 mg in PM, recheck phenytoin  level on 7/7 - APS 6/26, awaiting guardianship - PT/OT to treat - Fall and seizure precautions - Weekly starting Monday 7/7

## 2024-02-15 NOTE — Assessment & Plan Note (Signed)
 Patient stable on current seizure regimen. - Keppra  1500 mg twice daily -Klonopin  0.5 mg 3 times daily - Vimpat  200 mg twice daily - Perampanel  4 mg daily - Phenytoin  100 mg a.m. and 150 mg p.m., recheck phenytoin  level 7/7 - APS 6/26, awaiting guardianship -- PT/OT to treat -Fall and seizure precautions - Weekly labs starting Monday 7/7

## 2024-02-15 NOTE — Assessment & Plan Note (Signed)
 HLD: Atorvastatin  40 mg GERD: Protonix  40 mg

## 2024-02-15 NOTE — Progress Notes (Signed)
     Daily Progress Note Intern Pager: [phone]  Patient name: Patricia White Medical record number: [id number] Date of birth: 1970/12/29 Age: 53 y.o. Gender: female  Primary Care Provider: Alba Sharper, MD Consultants: Neurology (signed-off) Code Status: DNR-I  Pt Overview and Major Events to Date:  6/24: Admitted 6/27: Emergency guardianship petitioned   Medical Decision Making:  Patricia White is a 53 y.o. female admitted for multiple breakthrough seizures 2/2 medication non-administration. Pertinent PMH/PSH includes cerebral palsy, asthma, and seizures. APS report filed and guardianship is being pursued, awaiting update and safe disposition.  Social work left a message with DSS, will hopefully hear back Monday.  Assessment & Plan Seizure (HCC) Stable - Keppra  1500 mg BID - Klonopin  0.5 mg TID - Vimpat  200 mg BID - Perampanel  4 mg daily - Phenytoin  100 mg AM and 150 mg in PM, recheck phenytoin  level on 7/7 - APS 6/26, awaiting guardianship - PT/OT to treat - Fall and seizure precautions - Weekly starting Monday 7/7 Chronic health problem HLD: Atorvastatin  40 mg GERD: Protonix  40 mg   FEN/GI: Dysphagia 3 diet  PPx: Lovenox  SCDs Dispo:SNF/LTC safe disposition. Barriers include guardianship petition.   Subjective:  No acute events overnight. Patient resting comfortably this morning with no complaints.   Objective: Temp:  [97.4 F (36.3 C)-98.6 F (37 C)] 98.5 F (36.9 C) (07/04 1209) Pulse Rate:  [92-108] 103 (07/04 1209) Resp:  [16-19] 17 (07/04 1209) BP: (103-115)/(64-79) 103/70 (07/04 1209) SpO2:  [95 %-100 %] 100 % (07/04 1209) Physical Exam: General: Chronically-ill appearing, NAD Cardiovascular: RRR, no M/R/G Respiratory: clear to auscultation anteriorly, normal work of breathing on room air Abdomen: flat, soft, non-tender, bowel sounds present Extremities: no peripheral edema  Laboratory: Most recent CBC Lab Results  Component Value Date    WBC 7.6 02/08/2024   HGB 10.8 (L) 02/08/2024   HCT 34.9 (L) 02/08/2024   MCV 90.4 02/08/2024   PLT 455 (H) 02/08/2024   Most recent BMP    Latest Ref Rng & Units 02/14/2024    4:35 AM  BMP  Glucose 70 - 99 mg/dL 98   BUN 6 - 20 mg/dL <5   Creatinine 9.55 - 1.00 mg/dL 9.02   Sodium 864 - 854 mmol/L 136   Potassium 3.5 - 5.1 mmol/L 3.5   Chloride 98 - 111 mmol/L 104   CO2 22 - 32 mmol/L 24   Calcium  8.9 - 10.3 mg/dL 8.8     Imaging/Diagnostic Tests: No new imaging   Lennie Raguel MATSU, DO 02/15/2024, 2:00 PM  PGY-1, [facility] FPTS Intern pager: [phone], text pages welcome Secure chat group [facility] Teaching Service

## 2024-02-16 DIAGNOSIS — G40901 Epilepsy, unspecified, not intractable, with status epilepticus: Secondary | ICD-10-CM | POA: Diagnosis not present

## 2024-02-16 NOTE — Assessment & Plan Note (Signed)
 Stable. - Keppra  1500 mg BID - Klonopin  0.5 mg TID - Vimpat  200 mg BID - Perampanel  4 mg daily - Phenytoin  100 mg a.m. and 150 mg p.m., recheck phenytoin  level 7/7 - Weekly labs on Mondays

## 2024-02-16 NOTE — Assessment & Plan Note (Signed)
 HLD: Atorvastatin  40 mg GERD: Protonix  40 mg

## 2024-02-16 NOTE — Progress Notes (Signed)
     Daily Progress Note Intern Pager: [phone]  Patient name: Patricia White Medical record number: [id number] Date of birth: 1970/11/24 Age: 53 y.o. Gender: female  Primary Care Provider: Alba Sharper, MD Consultants: Neuro (S/o) Code Status: DNR/DNI  Pt Overview and Major Events to Date:  6/24: Admitted  6/27: emergency petition for guardianship   Assessment and Plan:  MYRL BYNUM is a 53 y.o. female admitted for seizures 2/2 to medical non-administration now medically stable for discharge pending safe disposition. Pertinent PMH/PSH includes CP, asthma, seizures.  Assessment & Plan Seizure (HCC) Stable. - Keppra  1500 mg BID - Klonopin  0.5 mg TID - Vimpat  200 mg BID - Perampanel  4 mg daily - Phenytoin  100 mg a.m. and 150 mg p.m., recheck phenytoin  level 7/7 - Weekly labs on Mondays Chronic health problem HLD: Atorvastatin  40 mg GERD: Protonix  40 mg  FEN/GI: dysphagia 3 PPx: Lovenox   Dispo:SNF/LTC pending safe disposition. Barriers include guardianship petition and decision on appropriate care facility.   Subjective:  NAEO, resting comfortably.   Objective: Temp:  [97.8 F (36.6 C)-98.8 F (37.1 C)] 98.8 F (37.1 C) (07/05 1933) Pulse Rate:  [92-102] 102 (07/05 1933) Resp:  [16-18] 18 (07/05 1933) BP: (111-120)/(77-89) 117/85 (07/05 1933) SpO2:  [95 %-100 %] 100 % (07/05 1933) Physical Exam: General: Chronically ill-appearing, no acute distress Cardio: RRR, no murmur on exam Pulm: clear, No increased work of breathing Abdomen: Soft, bowel sounds present, nontender Extremity: No peripheral edema   Laboratory: Most recent CBC Lab Results  Component Value Date   WBC 7.6 02/08/2024   HGB 10.8 (L) 02/08/2024   HCT 34.9 (L) 02/08/2024   MCV 90.4 02/08/2024   PLT 455 (H) 02/08/2024   Most recent BMP    Latest Ref Rng & Units 02/14/2024    4:35 AM  BMP  Glucose 70 - 99 mg/dL 98   BUN 6 - 20 mg/dL <5   Creatinine 9.55 - 1.00 mg/dL 9.02   Sodium  864 - 854 mmol/L 136   Potassium 3.5 - 5.1 mmol/L 3.5   Chloride 98 - 111 mmol/L 104   CO2 22 - 32 mmol/L 24   Calcium  8.9 - 10.3 mg/dL 8.8    Imaging/Diagnostic Tests: No new imaging   [name], DO 02/16/2024, 8:34 PM  PGY-3, [place] [facility] FPTS Intern pager: [phone], text pages welcome Secure chat group [facility] : [facility] Teaching Service

## 2024-02-17 DIAGNOSIS — G40901 Epilepsy, unspecified, not intractable, with status epilepticus: Secondary | ICD-10-CM | POA: Diagnosis not present

## 2024-02-18 DIAGNOSIS — G40901 Epilepsy, unspecified, not intractable, with status epilepticus: Secondary | ICD-10-CM | POA: Diagnosis not present

## 2024-02-18 LAB — CBC
HCT: 34.3 % — ABNORMAL LOW (ref 36.0–46.0)
Hemoglobin: 10.6 g/dL — ABNORMAL LOW (ref 12.0–15.0)
MCH: 27.5 pg (ref 26.0–34.0)
MCHC: 30.9 g/dL (ref 30.0–36.0)
MCV: 88.9 fL (ref 80.0–100.0)
Platelets: 581 K/uL — ABNORMAL HIGH (ref 150–400)
RBC: 3.86 MIL/uL — ABNORMAL LOW (ref 3.87–5.11)
RDW: 14.4 % (ref 11.5–15.5)
WBC: 6.2 K/uL (ref 4.0–10.5)
nRBC: 0 % (ref 0.0–0.2)

## 2024-02-18 LAB — BASIC METABOLIC PANEL WITH GFR
Anion gap: 13 (ref 5–15)
BUN: <5 mg/dL — ABNORMAL LOW (ref 6–20)
CO2: 26 mmol/L (ref 22–32)
Calcium: 9.5 mg/dL (ref 8.9–10.3)
Chloride: 100 mmol/L (ref 98–111)
Creatinine, Ser: 0.78 mg/dL (ref 0.44–1.00)
GFR, Estimated: >60 mL/min (ref >60)
Glucose, Bld: 99 mg/dL (ref 70–99)
Potassium: 3.7 mmol/L (ref 3.5–5.1)
Sodium: 139 mmol/L (ref 135–145)

## 2024-02-18 LAB — PHENYTOIN LEVEL, TOTAL: Phenytoin Lvl: 10.6 ug/mL (ref 10.0–20.0)

## 2024-02-18 NOTE — Progress Notes (Signed)
     Daily Progress Note Intern Pager: [phone]  Patient name: Patricia White Medical record number: [id number] Date of birth: July 28, 1971 Age: 53 y.o. Gender: female  Primary Care Provider: Alba Sharper, MD Consultants: Neuro (signed off) Code Status: DNR-I  Pt Overview and Major Events to Date:   6/24: Admitted 6/27: Emergency petition for guardianship  Medical Decision Making:  53 yo f admitted for seizures secondary to medical administration and medically stable for discharge pending safe disposition.  Pertinent history includes cerebral palsy, asthma and seizures. Assessment & Plan Seizure (HCC) Stable. - Keppra  1500 mg BID - Klonopin  0.5 mg TID - Vimpat  200 mg BID - Perampanel  4 mg daily - Phenytoin  100 mg a.m. and 150 mg p.m., recheck phenytoin  level 7/7 - Weekly labs on Mondays Chronic health problem HLD: Atorvastatin  40 mg GERD: Protonix  40 mg  FEN/GI: Dysphagia 3 PPx: Lovenox  Dispo: SNF/LTC safe disposition. Barriers include guardianship petition.   Subjective:  NAEON, pt reporting no issues, in depth communication limited.   Objective: Temp:  [97.7 F (36.5 C)-98.6 F (37 C)] 97.7 F (36.5 C) (07/07 0352) Pulse Rate:  [83-100] 92 (07/07 0352) Resp:  [16-19] 19 (07/07 0352) BP: (99-115)/(63-82) 101/63 (07/07 0352) SpO2:  [96 %-99 %] 97 % (07/07 0352) Physical Exam: General: No acute distress, awake, alert and communicating.  Cardiovascular: RRR, no m/r/g Respiratory: CTA b/l, normal WoB intermittent upper airway sounds Abdomen: Soft, non tender non distended, BS auscultated  Laboratory: Most recent CBC Lab Results  Component Value Date   WBC 7.6 02/08/2024   HGB 10.8 (L) 02/08/2024   HCT 34.9 (L) 02/08/2024   MCV 90.4 02/08/2024   PLT 455 (H) 02/08/2024   Most recent BMP    Latest Ref Rng & Units 02/14/2024    4:35 AM  BMP  Glucose 70 - 99 mg/dL 98   BUN 6 - 20 mg/dL <5   Creatinine 9.55 - 1.00 mg/dL 9.02   Sodium 864 - 854 mmol/L  136   Potassium 3.5 - 5.1 mmol/L 3.5   Chloride 98 - 111 mmol/L 104   CO2 22 - 32 mmol/L 24   Calcium  8.9 - 10.3 mg/dL 8.8    Imaging/Diagnostic Tests: None  Manon Jester, DO 02/18/2024, 7:27 AM  PGY-1, [place] [facility] FPTS Intern pager: [phone], text pages welcome Secure chat group [facility] Teaching Service

## 2024-02-18 NOTE — Assessment & Plan Note (Signed)
 Stable. - Keppra  1500 mg BID - Klonopin  0.5 mg TID - Vimpat  200 mg BID - Perampanel  4 mg daily - Phenytoin  100 mg a.m. and 150 mg p.m., recheck phenytoin  level 7/7 - Weekly labs on Mondays

## 2024-02-18 NOTE — Progress Notes (Signed)
 Physical Therapy Treatment Patient Details Name: Patricia White MRN: [id number] DOB: 1970/11/29 Today's Date: 02/18/2024   History of Present Illness 53 y.o. female presents to [facility] 02/05/24 due to breakthrough seizure 2/2 medication non-adherence. Pt also with SIRS. Prior admit 5/15 due to seizure. 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    PT Comments  Pt received in supine and agreeable to session. Pt demonstrates improved bed mobility with cues for sequencing. Pt able to tolerate 2 standing trials from EOB, but demonstrates poor standing tolerance with pt sitting back to EOB. Pt able to take 2 steps, but declines further trials due to BLE pain.  Pt able to participate in BLE exercises with increased cues for technique and demonstrates BLE weakness (L>R). Pt continues to benefit from PT services to progress toward functional mobility goals.    If plan is discharge home, recommend the following: Help with stairs or ramp for entrance;A lot of help with walking and/or transfers;Supervision due to cognitive status;Assistance with cooking/housework;Assist for transportation   Can travel by private vehicle     No  Equipment Recommendations  Wheelchair (measurements PT);Wheelchair cushion (measurements PT)    Recommendations for Other Services       Precautions / Restrictions Precautions Precautions: Fall Recall of Precautions/Restrictions: Impaired Precaution/Restrictions Comments: seizure precs, L hemiparesis (congenital CP) Required Braces or Orthoses: Other Brace Other Brace: LLE AFO- not in room Restrictions Weight Bearing Restrictions Per Provider Order: No     Mobility  Bed Mobility Overal bed mobility: Needs Assistance Bed Mobility: Supine to Sit, Sit to Supine     Supine to sit: Mod assist, Used rails, HOB elevated Sit to supine: Min assist   General bed mobility comments: Mod A for trunk elevation and scooting forward to EOB. Min A for BLE elevation back to EOB     Transfers Overall transfer level: Needs assistance Equipment used: 2 person hand held assist Transfers: Sit to/from Stand Sit to Stand: Mod assist, +2 physical assistance           General transfer comment: Mod A +2 for power up from EOB x2. Attempted side steps towards [facility] with pt able to take 2 steps, but then sitting back to EOB    Ambulation/Gait               General Gait Details: unable   Stairs             Wheelchair Mobility     Tilt Bed    Modified Rankin (Stroke Patients Only)       Balance Overall balance assessment: Needs assistance Sitting-balance support: Feet supported, No upper extremity supported Sitting balance-Leahy Scale: Fair Sitting balance - Comments: sitting EOB with CGA. Pt with R lateral lean initially, but corrected with cues   Standing balance support: Bilateral upper extremity supported, During functional activity, Reliant on assistive device for balance Standing balance-Leahy Scale: Poor Standing balance comment: reliant on external support                            Communication Communication Communication: Impaired Factors Affecting Communication: Reduced clarity of speech;Difficulty expressing self  Cognition Arousal: Alert Behavior During Therapy: Flat affect   PT - Cognitive impairments: History of cognitive impairments, Sequencing, Problem solving, Safety/Judgement, No family/caregiver present to determine baseline, Difficult to assess, Initiation, Awareness, Memory, Attention Difficult to assess due to: Impaired communication  Following commands: Impaired Following commands impaired: Follows one step commands inconsistently, Follows one step commands with increased time    Cueing Cueing Techniques: Verbal cues, Gestural cues, Tactile cues, Visual cues  Exercises General Exercises - Lower Extremity Long Arc Quad: AROM, Both, 5 reps, Seated Hip Flexion/Marching:  AROM, Seated, Both, 5 reps    General Comments        Pertinent Vitals/Pain Pain Assessment Pain Assessment: Faces Faces Pain Scale: Hurts little more Pain Location: BLE Pain Descriptors / Indicators: Discomfort Pain Intervention(s): Limited activity within patient's tolerance, Monitored during session, Repositioned     PT Goals (current goals can now be found in the care plan section) Acute Rehab PT Goals Patient Stated Goal: unable to state goal PT Goal Formulation: Patient unable to participate in goal setting Time For Goal Achievement: 02/21/24 Progress towards PT goals: Progressing toward goals    Frequency    Min 2X/week       AM-PAC PT 6 Clicks Mobility   Outcome Measure  Help needed turning from your back to your side while in a flat bed without using bedrails?: A Lot Help needed moving from lying on your back to sitting on the side of a flat bed without using bedrails?: A Lot Help needed moving to and from a bed to a chair (including a wheelchair)?: Total Help needed standing up from a chair using your arms (e.g., wheelchair or bedside chair)?: Total Help needed to walk in hospital room?: Total Help needed climbing 3-5 steps with a railing? : Total 6 Click Score: 8    End of Session Equipment Utilized During Treatment: Gait belt Activity Tolerance: Patient tolerated treatment well;Patient limited by fatigue Patient left: in bed;with call bell/phone within reach;with bed alarm set Nurse Communication: Mobility status PT Visit Diagnosis: Other abnormalities of gait and mobility (R26.89);Hemiplegia and hemiparesis;Muscle weakness (generalized) (M62.81) Hemiplegia - Right/Left: Left Hemiplegia - dominant/non-dominant: Non-dominant Hemiplegia - caused by: Unspecified     Time: 8591-8575 PT Time Calculation (min) (ACUTE ONLY): 16 min  Charges:    $Therapeutic Activity: 8-22 mins PT General Charges $$ ACUTE PT VISIT: 1 Visit                      Darryle George, PTA Acute Rehabilitation Services Secure Chat Preferred  [phone]    Darryle George 02/18/2024, 3:55 PM

## 2024-02-18 NOTE — Assessment & Plan Note (Signed)
 HLD: Atorvastatin  40 mg GERD: Protonix  40 mg

## 2024-02-18 NOTE — Plan of Care (Signed)
  Problem: Clinical Measurements: Goal: Ability to maintain clinical measurements within normal limits will improve Outcome: Progressing Goal: Will remain free from infection Outcome: Progressing Goal: Diagnostic test results will improve Outcome: Progressing Goal: Respiratory complications will improve Outcome: Progressing Goal: Cardiovascular complication will be avoided Outcome: Progressing   Problem: Activity: Goal: Risk for activity intolerance will decrease Outcome: Progressing   Problem: Nutrition: Goal: Adequate nutrition will be maintained Outcome: Progressing   Problem: Coping: Goal: Level of anxiety will decrease Outcome: Progressing   Problem: Elimination: Goal: Will not experience complications related to bowel motility Outcome: Progressing Goal: Will not experience complications related to urinary retention Outcome: Progressing   Problem: Pain Managment: Goal: General experience of comfort will improve and/or be controlled Outcome: Progressing   Problem: Safety: Goal: Ability to remain free from injury will improve Outcome: Progressing   Problem: Skin Integrity: Goal: Risk for impaired skin integrity will decrease Outcome: Progressing   Problem: Education: Goal: Expressions of having a comfortable level of knowledge regarding the disease process will increase Outcome: Progressing   Problem: Coping: Goal: Ability to adjust to condition or change in health will improve Outcome: Progressing Goal: Ability to identify appropriate support needs will improve Outcome: Progressing   Problem: Health Behavior/Discharge Planning: Goal: Compliance with prescribed medication regimen will improve Outcome: Progressing   Problem: Medication: Goal: Risk for medication side effects will decrease Outcome: Progressing   Problem: Clinical Measurements: Goal: Complications related to the disease process, condition or treatment will be avoided or minimized Outcome:  Progressing Goal: Diagnostic test results will improve Outcome: Progressing   Problem: Safety: Goal: Verbalization of understanding the information provided will improve Outcome: Progressing   Problem: Self-Concept: Goal: Level of anxiety will decrease Outcome: Progressing Goal: Ability to verbalize feelings about condition will improve Outcome: Progressing

## 2024-02-19 DIAGNOSIS — G40901 Epilepsy, unspecified, not intractable, with status epilepticus: Secondary | ICD-10-CM | POA: Diagnosis not present

## 2024-02-19 NOTE — Assessment & Plan Note (Signed)
 HLD: Atorvastatin  40 mg GERD: Protonix  40 mg

## 2024-02-19 NOTE — TOC Progression Note (Signed)
 Transition of Care [facility]) - Progression Note    Patient Details  Name: Patricia White MRN: [id number] Date of Birth: 1970/09/08  Transition of Care [facility]) CM/SW Contact  Almarie CHRISTELLA Goodie, [place] Phone Number: 02/19/2024, 10:53 AM  Clinical Narrative:   CSW asked CMA to initiate insurance authorization request. Authorization pending. CSW to follow.    Expected Discharge Plan: Skilled Nursing Facility    Expected Discharge Plan and Services                                               Social Determinants of Health (SDOH) Interventions SDOH Screenings   Food Insecurity: No Food Insecurity (02/06/2024)  Housing: Low Risk  (02/06/2024)  Transportation Needs: Patient Unable To Answer (02/06/2024)  Utilities: Not At Risk (02/06/2024)  Alcohol Screen: Low Risk  (03/24/2023)  Depression (PHQ2-9): Low Risk  (08/09/2023)  Financial Resource Strain: Low Risk  (03/24/2023)  Physical Activity: Insufficiently Active (03/24/2023)  Social Connections: Moderately Isolated (11/19/2023)  Stress: No Stress Concern Present (03/24/2023)  Tobacco Use: Low Risk  (02/05/2024)  Health Literacy: Adequate Health Literacy (03/24/2023)    Readmission Risk Interventions    11/16/2023   11:48 AM  Readmission Risk Prevention Plan  Transportation Screening Complete  PCP or Specialist Appt within 5-7 Days Complete  Home Care Screening Complete  Medication Review (RN CM) Complete

## 2024-02-19 NOTE — Progress Notes (Signed)
 Occupational Therapy Treatment Patient Details Name: Patricia White MRN: [id number] DOB: 03-07-1971 Today's Date: 02/19/2024   History of present illness 53 y.o. female presents to [facility] 02/05/24 due to breakthrough seizure 2/2 medication non-adherence. Pt also with SIRS. Prior admit 5/15 due to seizure. 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   OT comments  Pt seen for OT tx this date. Requires setup assist for breakfast to open containers due to decreased bimanual coordination due to chronic LUE deficits, MAX A for rolling in bed for pericare, MAX A to don gown, pt washes face with setup bed level but has decreased motivation to perform task independently, and request OT wash other half of face. Pt perseverates throughout session on wanting a soda/snack. Pt will require 24/7 assist due to physical and cognitive impairments, recommending +2 assist for OOB mobility. POC and goals updated this date, OT will continue to progress as able. Patient will benefit from continued inpatient follow up therapy, <3 hours/day       If plan is discharge home, recommend the following:  Two people to help with walking and/or transfers;Assistance with cooking/housework;Direct supervision/assist for medications management;Direct supervision/assist for financial management;Assist for transportation;Supervision due to cognitive status;Help with stairs or ramp for entrance;Two people to help with bathing/dressing/bathroom   Equipment Recommendations  Other (comment) (defer to next LOC)       Precautions / Restrictions Precautions Precautions: Fall Recall of Precautions/Restrictions: Impaired Precaution/Restrictions Comments: seizure precs, L hemiparesis (congenital CP) Required Braces or Orthoses: Other Brace Other Brace: LLE AFO- not in room Restrictions Weight Bearing Restrictions Per Provider Order: No       Mobility Bed Mobility Overal bed mobility: Needs Assistance Bed Mobility: Rolling            General bed mobility comments: maxA to roll bed level    Transfers Overall transfer level: Needs assistance                 General transfer comment: NT due to need for pericare     Balance Overall balance assessment: Needs assistance     Sitting balance - Comments: NT       Standing balance comment: NT                           ADL either performed or assessed with clinical judgement   ADL Overall ADL's : Needs assistance/impaired Eating/Feeding: Bed level;Set up Eating/Feeding Details (indicate cue type and reason): requires set-up to open containers, prepare tray, pt is able to self-feed but asks author to feed her             Upper Body Dressing : Bed level;Maximal assistance Upper Body Dressing Details (indicate cue type and reason): pt lacks intrinsic motivation to perform task on own; likely is able to dress self with MIN A but MAX A required due to decreased motivation         Toileting- Clothing Manipulation and Hygiene: Bed level;Maximal assistance Toileting - Clothing Manipulation Details (indicate cue type and reason): maxA for pericare bed level, pt able to roll with maxA       General ADL Comments: Pt with decreased task initiation, child-like speech and behaviors at times. Perseverates on wanting snack/soda, but is able to tell this author she is wet and needs gown changed.    Extremity/Trunk Assessment Upper Extremity Assessment RUE Deficits / Details: RUE WFL for ROM and grip strength. LUE Deficits / Details: Congential CP, L  hemi, pt using elbow flexors/ext to reposition on command.   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, child - like behaviors, perseverates throughout on wanting a snack/soda                 Following commands: Impaired Following commands impaired: Follows one step commands inconsistently, Follows one step commands with increased time (follows commands when intrinsically motivated, eg to reach for food but otherwise max encourgement to assist with rolling, pulling up in bed, etc)      Cueing   Cueing Techniques: Verbal cues, Gestural cues, Tactile cues, Visual cues        General Comments VSS    Pertinent Vitals/ Pain       Pain Assessment Pain Assessment: No/denies pain Pain Score: 0-No pain   Frequency  Min 2X/week        Progress Toward Goals  OT Goals(current goals can now be found in the care plan section)  Progress towards OT goals: Progressing toward goals (gradual progress)  Acute Rehab OT Goals OT Goal Formulation: With patient Time For Goal Achievement: 03/04/24 Potential to Achieve Goals: Fair  Plan         AM-PAC OT 6 Clicks Daily Activity     Outcome Measure   Help from another person eating meals?: A Little Help from another person taking care of personal grooming?: A Little Help from another person toileting, which includes using toliet, bedpan, or urinal?: A Lot Help from another person bathing (including washing, rinsing, drying)?: A Lot Help from another person to put on and taking off regular upper body clothing?: A Lot Help from another person to put on and taking off regular lower body clothing?: A Lot 6 Click Score: 14     End of Session    OT Visit Diagnosis: Unsteadiness on feet (R26.81);Other abnormalities of gait and mobility (R26.89);Pain;Muscle weakness (generalized) (M62.81) Hemiplegia - Right/Left: Left Hemiplegia - dominant/non-dominant: Non-Dominant   Activity Tolerance Patient tolerated treatment well   Patient Left in bed;with call bell/phone within reach;with bed alarm set           Time: 9059-8980 OT Time Calculation (min): 39 min  Charges: OT General Charges $OT Visit: 1 Visit OT Treatments $Self Care/Home Management : 38-52 mins  Lorieann Argueta L. Joshu Furukawa, OTR/L  02/19/24, 10:39 AM

## 2024-02-19 NOTE — Progress Notes (Signed)
     Daily Progress Note Intern Pager: [phone]  Patient name: CURTIS URIARTE Medical record number: [id number] Date of birth: [birth year] Age: 53 y.o. Gender: female  Primary Care Provider: Alba Sharper, MD Consultants: Neuro (signed off) Code Status: DNR-I  Pt Overview and Major Events to Date:  6/24: Admitted 6/27: Emergency petition for guardianship  Medical Decision Making:  53 yo f seizures 2/2 medication non admin, medically stable, awaiting safe disposition. PMhx CP, asthma, seizures.  Assessment & Plan Seizure (HCC) Stable. - Keppra  1500 mg BID - Klonopin  0.5 mg TID - Vimpat  200 mg BID - Perampanel  4 mg daily - Phenytoin  100 mg a.m. and 150 mg p.m., phenytoin  level 10.6 7/7, recheck phenytoin  level 7/14 - Weekly labs on Mondays Chronic health problem HLD: Atorvastatin  40 mg GERD: Protonix  40 mg   FEN/GI: Dysphagia 3 PPx: Lovenox  Dispo: SNF/LTC safe disposition.  Barriers include guardianship petition.  Subjective:  No acute events overnight, patient reporting no issues, communication limited.  Objective: Temp:  [97.4 F (36.3 C)-98.7 F (37.1 C)] 98.3 F (36.8 C) (07/08 0735) Pulse Rate:  [76-102] 76 (07/08 0735) Resp:  [18-19] 18 (07/08 0735) BP: (106-117)/(73-88) 115/81 (07/08 0735) SpO2:  [98 %-100 %] 100 % (07/08 0735) Physical Exam: General: Awake alert, no acute distress.  Cardiovascular: RRR, no M/R/G Respiratory: CTA B/L, normal work of breathing  Abdomen: Soft, non tender, non distended  Laboratory: Most recent CBC Lab Results  Component Value Date   WBC 6.2 02/18/2024   HGB 10.6 (L) 02/18/2024   HCT 34.3 (L) 02/18/2024   MCV 88.9 02/18/2024   PLT 581 (H) 02/18/2024   Most recent BMP    Latest Ref Rng & Units 02/18/2024    9:52 AM  BMP  Glucose 70 - 99 mg/dL 99   BUN 6 - 20 mg/dL <5   Creatinine 9.55 - 1.00 mg/dL 9.21   Sodium 864 - 854 mmol/L 139   Potassium 3.5 - 5.1 mmol/L 3.7   Chloride 98 - 111 mmol/L 100   CO2 22 -  32 mmol/L 26   Calcium  8.9 - 10.3 mg/dL 9.5     Other pertinent labs Phenytoin  12.6 corrected   Imaging/Diagnostic Tests: None  Manon Jester, DO 02/19/2024, 10:04 AM  PGY-1,  Family Medicine FPTS Intern pager: [phone], text pages welcome Secure chat group [facility] Teaching Service

## 2024-02-19 NOTE — Plan of Care (Signed)
  Problem: Clinical Measurements: Goal: Ability to maintain clinical measurements within normal limits will improve Outcome: Progressing Goal: Will remain free from infection Outcome: Progressing   Problem: Nutrition: Goal: Adequate nutrition will be maintained Outcome: Progressing   Problem: Pain Managment: Goal: General experience of comfort will improve and/or be controlled Outcome: Progressing   Problem: Safety: Goal: Ability to remain free from injury will improve Outcome: Progressing

## 2024-02-19 NOTE — Discharge Instructions (Addendum)
 Dear Patricia White,  Thank you for letting us  participate in your care. You were hospitalized for altered mental status and diagnosed with Seizure (HCC). You were treated with antiseizure medicines.  Your blood also had a bacteria in them for which you received antibiotics.  POST-HOSPITAL & CARE INSTRUCTIONS Be sure to take all of your medications as listed in this packet. Go to your follow up appointments (listed below)  DOCTOR'S APPOINTMENT   Future Appointments  Date Time Provider Department Center  03/24/2024  1:40 PM [facility] ANNUAL FERDIE VISIT [facility]  05/21/2024 11:30 AM Penumalli, Eduard SAUNDERS, MD [facility] None    Contact information for follow-up providers     Alba Sharper, MD. Schedule an appointment as soon as possible for a visit.   Specialty: Family Medicine Why: For hospital follow-up ASAP. Contact information: [address] [place] [address] [phone]              Contact information for after-discharge care     Destination     [facility]   Service: Skilled Nursing Contact information: [address] [place]  [phone]                     Take care and be well!  Family Medicine Teaching Service Inpatient Team [place]  [facility]  [address] [place], [place] [address] [phone]

## 2024-02-19 NOTE — TOC Progression Note (Signed)
 Transition of Care [facility]) - Progression Note    Patient Details  Name: Patricia White MRN: [id number] Date of Birth: March 07, 1971  [facility] CM/SW Contact  Almarie CHRISTELLA Goodie, [place] Phone Number: 02/19/2024, 10:51 AM  Clinical Narrative:   CSW spoke with [facility] with APS about SNF options, and Nichole would be interested in [facility] as first choice if able to do both rehab and LTC. Nichole wants to ensure that the patient won't have to make multiple moves; once she goes somewhere, that she will be able to stay. CSW contacted [facility] to discuss, they will be able to take patient for rehab and LTC. CSW updated Nichole. CSW to request CMA to initiate insurance authorization.    Expected Discharge Plan: Skilled Nursing Facility    Expected Discharge Plan and Services                                               Social Determinants of Health (SDOH) Interventions SDOH Screenings   Food Insecurity: No Food Insecurity (02/06/2024)  Housing: Low Risk  (02/06/2024)  Transportation Needs: Patient Unable To Answer (02/06/2024)  Utilities: Not At Risk (02/06/2024)  Alcohol Screen: Low Risk  (03/24/2023)  Depression (PHQ2-9): Low Risk  (08/09/2023)  Financial Resource Strain: Low Risk  (03/24/2023)  Physical Activity: Insufficiently Active (03/24/2023)  Social Connections: Moderately Isolated (11/19/2023)  Stress: No Stress Concern Present (03/24/2023)  Tobacco Use: Low Risk  (02/05/2024)  Health Literacy: Adequate Health Literacy (03/24/2023)    Readmission Risk Interventions    11/16/2023   11:48 AM  Readmission Risk Prevention Plan  Transportation Screening Complete  PCP or Specialist Appt within 5-7 Days Complete  Home Care Screening Complete  Medication Review (RN CM) Complete

## 2024-02-19 NOTE — Plan of Care (Signed)
  Problem: Clinical Measurements: Goal: Ability to maintain clinical measurements within normal limits will improve Outcome: Progressing Goal: Will remain free from infection Outcome: Progressing Goal: Diagnostic test results will improve Outcome: Progressing Goal: Respiratory complications will improve Outcome: Progressing Goal: Cardiovascular complication will be avoided Outcome: Progressing   Problem: Activity: Goal: Risk for activity intolerance will decrease Outcome: Progressing   Problem: Nutrition: Goal: Adequate nutrition will be maintained Outcome: Progressing   Problem: Coping: Goal: Level of anxiety will decrease Outcome: Progressing   Problem: Elimination: Goal: Will not experience complications related to bowel motility Outcome: Progressing Goal: Will not experience complications related to urinary retention Outcome: Progressing   Problem: Pain Managment: Goal: General experience of comfort will improve and/or be controlled Outcome: Progressing   Problem: Safety: Goal: Ability to remain free from injury will improve Outcome: Progressing   Problem: Skin Integrity: Goal: Risk for impaired skin integrity will decrease Outcome: Progressing   Problem: Education: Goal: Expressions of having a comfortable level of knowledge regarding the disease process will increase Outcome: Progressing   Problem: Coping: Goal: Ability to adjust to condition or change in health will improve Outcome: Progressing Goal: Ability to identify appropriate support needs will improve Outcome: Progressing   Problem: Health Behavior/Discharge Planning: Goal: Compliance with prescribed medication regimen will improve Outcome: Progressing   Problem: Medication: Goal: Risk for medication side effects will decrease Outcome: Progressing   Problem: Clinical Measurements: Goal: Complications related to the disease process, condition or treatment will be avoided or minimized Outcome:  Progressing Goal: Diagnostic test results will improve Outcome: Progressing   Problem: Safety: Goal: Verbalization of understanding the information provided will improve Outcome: Progressing   Problem: Self-Concept: Goal: Level of anxiety will decrease Outcome: Progressing Goal: Ability to verbalize feelings about condition will improve Outcome: Progressing

## 2024-02-19 NOTE — Assessment & Plan Note (Signed)
 Stable. - Keppra  1500 mg BID - Klonopin  0.5 mg TID - Vimpat  200 mg BID - Perampanel  4 mg daily - Phenytoin  100 mg a.m. and 150 mg p.m., phenytoin  level 10.6 7/7, recheck phenytoin  level 7/14 - Weekly labs on Mondays

## 2024-02-20 DIAGNOSIS — Z6823 Body mass index (BMI) 23.0-23.9, adult: Secondary | ICD-10-CM | POA: Diagnosis not present

## 2024-02-20 DIAGNOSIS — G40901 Epilepsy, unspecified, not intractable, with status epilepticus: Secondary | ICD-10-CM | POA: Diagnosis not present

## 2024-02-20 DIAGNOSIS — E1169 Type 2 diabetes mellitus with other specified complication: Secondary | ICD-10-CM | POA: Diagnosis not present

## 2024-02-20 DIAGNOSIS — G809 Cerebral palsy, unspecified: Secondary | ICD-10-CM | POA: Diagnosis not present

## 2024-02-20 DIAGNOSIS — M6281 Muscle weakness (generalized): Secondary | ICD-10-CM | POA: Diagnosis not present

## 2024-02-20 DIAGNOSIS — R262 Difficulty in walking, not elsewhere classified: Secondary | ICD-10-CM | POA: Diagnosis not present

## 2024-02-20 DIAGNOSIS — K219 Gastro-esophageal reflux disease without esophagitis: Secondary | ICD-10-CM | POA: Diagnosis not present

## 2024-02-20 DIAGNOSIS — E785 Hyperlipidemia, unspecified: Secondary | ICD-10-CM | POA: Diagnosis not present

## 2024-02-20 DIAGNOSIS — A419 Sepsis, unspecified organism: Secondary | ICD-10-CM | POA: Diagnosis not present

## 2024-02-20 MED ORDER — POLYETHYLENE GLYCOL 3350 17 G PO PACK: 17.0000 g | PACK | Freq: Every day | ORAL | Status: AC

## 2024-02-20 MED ORDER — LACOSAMIDE 200 MG PO TABS: 200.0000 mg | ORAL_TABLET | Freq: Two times a day (BID) | ORAL | 0 refills | Status: DC

## 2024-02-20 MED ORDER — ROSUVASTATIN CALCIUM 20 MG PO TABS: 20.0000 mg | ORAL_TABLET | Freq: Every day | ORAL | Status: AC

## 2024-02-20 MED ORDER — PANTOPRAZOLE SODIUM 40 MG PO TBEC: 40.0000 mg | DELAYED_RELEASE_TABLET | Freq: Every day | ORAL | Status: AC

## 2024-02-20 MED ORDER — SENNOSIDES-DOCUSATE SODIUM 8.6-50 MG PO TABS: 1.0000 | ORAL_TABLET | Freq: Every day | ORAL | Status: AC

## 2024-02-20 MED ORDER — PHENYTOIN 50 MG PO CHEW: CHEWABLE_TABLET | ORAL | Status: DC

## 2024-02-20 MED ORDER — ACETAMINOPHEN 325 MG PO TABS: 650.0000 mg | ORAL_TABLET | Freq: Four times a day (QID) | ORAL | Status: AC | PRN

## 2024-02-20 MED ORDER — CLONAZEPAM 0.5 MG PO TABS: 0.5000 mg | ORAL_TABLET | Freq: Three times a day (TID) | ORAL | 0 refills | Status: DC

## 2024-02-20 NOTE — Progress Notes (Signed)
 [facility] RN called report to [employer]. Family notified of transfer by CSW. Awaiting [facility] to discharge.

## 2024-02-20 NOTE — Assessment & Plan Note (Signed)
 Stable. - Keppra  1500 mg BID - Klonopin  0.5 mg TID - Vimpat  200 mg BID - Perampanel  4 mg daily - Phenytoin  100 mg a.m. and 150 mg p.m., phenytoin  level 10.6 7/7, recheck phenytoin  level 7/14 - Weekly labs on Mondays

## 2024-02-20 NOTE — TOC Transition Note (Signed)
 Transition of Care [facility]) - Discharge Note   Patient Details  Name: Patricia White MRN: [id number] Date of Birth: 02-26-1971  [facility] CM/SW Contact:  Almarie CHRISTELLA Goodie, LCSW Phone Number: 02/20/2024, 3:22 PM   Clinical Narrative:   Patient received insurance approval to admit to SNF, [facility] can accept today. CSW notified guardian, Etha Chang, via phone and left a voicemail. CSW updated MD and sent discharge information to [facility], confirmed receipt. Transport arranged with [facility] for next available.  Nurse to call report to [phone], Room 305.    Final next level of care: Skilled Nursing Facility Barriers to Discharge: Barriers Resolved   Patient Goals and CMS Choice            Discharge Placement              Patient chooses bed at: [facility] Patient to be transferred to facility by: [facility] Name of family member notified: Nichole Patient and family notified of of transfer: 02/20/24  Discharge Plan and Services Additional resources added to the After Visit Summary for                                       Social Drivers of Health (SDOH) Interventions SDOH Screenings   Food Insecurity: No Food Insecurity (02/06/2024)  Housing: Low Risk  (02/06/2024)  Transportation Needs: Patient Unable To Answer (02/06/2024)  Utilities: Not At Risk (02/06/2024)  Alcohol Screen: Low Risk  (03/24/2023)  Depression (PHQ2-9): Low Risk  (08/09/2023)  Financial Resource Strain: Low Risk  (03/24/2023)  Physical Activity: Insufficiently Active (03/24/2023)  Social Connections: Moderately Isolated (11/19/2023)  Stress: No Stress Concern Present (03/24/2023)  Tobacco Use: Low Risk  (02/05/2024)  Health Literacy: Adequate Health Literacy (03/24/2023)     Readmission Risk Interventions    11/16/2023   11:48 AM  Readmission Risk Prevention Plan  Transportation Screening Complete  PCP or Specialist Appt within 5-7 Days Complete  Home  Care Screening Complete  Medication Review (RN CM) Complete

## 2024-02-20 NOTE — Progress Notes (Signed)
 Physical Therapy Treatment Patient Details Name: Patricia White MRN: [id number] DOB: 1970/12/02 Today's Date: 02/20/2024   History of Present Illness 53 y.o. female presents to [facility] 02/05/24 due to breakthrough seizure 2/2 medication non-adherence. Pt also with SIRS. Prior admit 5/15 due to seizure. 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    PT Comments  Pt received in supine and agreeable to session. Pt demonstrates good initiation to sit to EOB, but requires increased assist to return to supine due to decreased command following and distraction. Pt demonstrates anterior lean sitting EOB requiring increased assist to maintain upright posture. Pt able to tolerate a standing trial, however requires max A +2 to reach an upright posture. Pt requires redirection to task throughout session due to pt focused on tray table and calling family. Pt continues to benefit from PT services to progress toward functional mobility goals.    If plan is discharge home, recommend the following: Help with stairs or ramp for entrance;A lot of help with walking and/or transfers;Supervision due to cognitive status;Assistance with cooking/housework;Assist for transportation   Can travel by private vehicle     No  Equipment Recommendations  Wheelchair (measurements PT);Wheelchair cushion (measurements PT)    Recommendations for Other Services       Precautions / Restrictions Precautions Precautions: Fall Recall of Precautions/Restrictions: Impaired Precaution/Restrictions Comments: seizure precs, L hemiparesis (congenital CP) Required Braces or Orthoses: Other Brace Other Brace: LLE AFO- not in room Restrictions Weight Bearing Restrictions Per Provider Order: No     Mobility  Bed Mobility Overal bed mobility: Needs Assistance Bed Mobility: Supine to Sit, Sit to Supine     Supine to sit: Mod assist, Used rails, HOB elevated, +2 for safety/equipment Sit to supine: Max assist, +2 for physical  assistance, +2 for safety/equipment   General bed mobility comments: pt initiated coming to EOB with MODA +2 mostly needed to elevate trunk , increased assist needed to transition to supine d/t resistant behavior    Transfers Overall transfer level: Needs assistance Equipment used: 2 person hand held assist Transfers: Sit to/from Stand Sit to Stand: Max assist, +2 physical assistance, +2 safety/equipment           General transfer comment: MAX A +2 to stand from EOB with pt having difficulty elevating trunk and fully extending hips in standing. Poor standing tolerance and pt deferred further trials    Ambulation/Gait                   Stairs             Wheelchair Mobility     Tilt Bed    Modified Rankin (Stroke Patients Only)       Balance Overall balance assessment: Needs assistance Sitting-balance support: Feet supported, No upper extremity supported Sitting balance-Leahy Scale: Poor Sitting balance - Comments: heavy anterior lean sitting EOB   Standing balance support: Bilateral upper extremity supported, During functional activity, Reliant on assistive device for balance Standing balance-Leahy Scale: Poor Standing balance comment: MAX A +2 to keep trunk elevated and UB supported                            Communication Communication Communication: Impaired Factors Affecting Communication: Reduced clarity of speech;Difficulty expressing self  Cognition Arousal: Alert Behavior During Therapy: Flat affect, Impulsive   PT - Cognitive impairments: History of cognitive impairments, Sequencing, Problem solving, Safety/Judgement, No family/caregiver present to determine baseline, Difficult to assess, Initiation,  Awareness, Memory, Attention Difficult to assess due to: Impaired communication                       Following commands: Impaired Following commands impaired: Follows one step commands with increased time, Follows  multi-step commands inconsistently    Cueing Cueing Techniques: Verbal cues, Gestural cues, Tactile cues, Visual cues  Exercises      General Comments        Pertinent Vitals/Pain Pain Assessment Pain Assessment: No/denies pain     PT Goals (current goals can now be found in the care plan section) Acute Rehab PT Goals Patient Stated Goal: unable to state goal PT Goal Formulation: Patient unable to participate in goal setting Time For Goal Achievement: 02/21/24 Progress towards PT goals: Progressing toward goals    Frequency    Min 2X/week      PT Plan      Co-evaluation PT/OT/SLP Co-Evaluation/Treatment: Yes Reason for Co-Treatment: To address functional/ADL transfers;For patient/therapist safety PT goals addressed during session: Mobility/safety with mobility;Balance OT goals addressed during session: ADL's and self-care      AM-PAC PT 6 Clicks Mobility   Outcome Measure  Help needed turning from your back to your side while in a flat bed without using bedrails?: A Lot Help needed moving from lying on your back to sitting on the side of a flat bed without using bedrails?: A Lot Help needed moving to and from a bed to a chair (including a wheelchair)?: Total Help needed standing up from a chair using your arms (e.g., wheelchair or bedside chair)?: Total Help needed to walk in hospital room?: Total Help needed climbing 3-5 steps with a railing? : Total 6 Click Score: 8    End of Session Equipment Utilized During Treatment: Gait belt Activity Tolerance: Patient tolerated treatment well;Patient limited by fatigue Patient left: in bed;with call bell/phone within reach;with bed alarm set Nurse Communication: Mobility status PT Visit Diagnosis: Other abnormalities of gait and mobility (R26.89);Hemiplegia and hemiparesis;Muscle weakness (generalized) (M62.81) Hemiplegia - Right/Left: Left Hemiplegia - dominant/non-dominant: Non-dominant Hemiplegia - caused by:  Unspecified     Time: 8641-8580 PT Time Calculation (min) (ACUTE ONLY): 21 min  Charges:    $Therapeutic Activity: 8-22 mins PT General Charges $$ ACUTE PT VISIT: 1 Visit                     Darryle George, PTA Acute Rehabilitation Services Secure Chat Preferred  [phone]    Darryle George 02/20/2024, 3:30 PM

## 2024-02-20 NOTE — Progress Notes (Signed)
 Occupational Therapy Treatment Patient Details Name: Patricia White MRN: [id number] DOB: 05-18-1971 Today's Date: 02/20/2024   History of present illness 53 y.o. female presents to [facility] 02/05/24 due to breakthrough seizure 2/2 medication non-adherence. Pt also with SIRS. Prior admit 5/15 due to seizure. 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   OT comments  Pt seen in conjunction with PT to maximize pts activity tolerance and optimize pt participation. Pt continues to present with baseline cognitive deficits, impaired balance and global deconditioning. Pt currently requires MODA +2 for bed mobility and MAX A +2 for sit>stands with hand held assist. Pt with increased difficulty elevating trunk today in standing and sitting. Pt perseverating on wanting to call her family and wanting more onion chips. Increased difficulty redirecting pt. Patient will benefit from continued inpatient follow up therapy, <3 hours/day         If plan is discharge home, recommend the following:  Two people to help with walking and/or transfers;Assistance with cooking/housework;Direct supervision/assist for medications management;Direct supervision/assist for financial management;Assist for transportation;Supervision due to cognitive status;Help with stairs or ramp for entrance;Two people to help with bathing/dressing/bathroom   Equipment Recommendations  Other (comment) (defer to next LOC)    Recommendations for Other Services      Precautions / Restrictions Precautions Precautions: Fall Recall of Precautions/Restrictions: Impaired Precaution/Restrictions Comments: seizure precs, L hemiparesis (congenital CP) Required Braces or Orthoses: Other Brace Other Brace: LLE AFO- not in room Restrictions Weight Bearing Restrictions Per Provider Order: No       Mobility Bed Mobility Overal bed mobility: Needs Assistance Bed Mobility: Supine to Sit, Sit to Supine     Supine to sit: Mod assist, Used  rails, HOB elevated, +2 for safety/equipment Sit to supine: Max assist, +2 for physical assistance, +2 for safety/equipment   General bed mobility comments: pt initiated coming to EOB with MODA +2 mostly needed to elevate trunk , increased assist needed to transition to supine d/t resistant behavior    Transfers Overall transfer level: Needs assistance Equipment used: 2 person hand held assist Transfers: Sit to/from Stand Sit to Stand: Max assist, +2 physical assistance, +2 safety/equipment           General transfer comment: MAX A +2 to stand from EOB with pt having difficulty elevating trunk and fully extending hips in standing     Balance Overall balance assessment: Needs assistance Sitting-balance support: Feet supported, No upper extremity supported Sitting balance-Leahy Scale: Poor Sitting balance - Comments: heavy anterior lean with pt perseverating on reaching for items on her table, placed table in front of pt in order to facilitate upright posture Postural control: Other (comment) (anterior lean) Standing balance support: Bilateral upper extremity supported, During functional activity, Reliant on assistive device for balance Standing balance-Leahy Scale: Poor Standing balance comment: MAX A +2 to keep trunk elevated and UB supported                           ADL either performed or assessed with clinical judgement   ADL Overall ADL's : Needs assistance/impaired     Grooming: Wash/dry face;Set up;Bed level Grooming Details (indicate cue type and reason): pt able to initiate washing face but provided max A for cleanliness                 Toilet Transfer: Maximal assistance;+2 for physical assistance;+2 for safety/equipment Toilet Transfer Details (indicate cue type and reason): MAX A +2 to stand from EOB  d/t flexed posture; unable to pivot for safety         Functional mobility during ADLs: Maximal assistance;+2 for physical assistance;+2 for  safety/equipment (sit>stand only) General ADL Comments: ADL particpation impacted by baseline cognitive deficits and impaired attention to task    Extremity/Trunk Assessment Upper Extremity Assessment Upper Extremity Assessment: Generalized weakness LUE Deficits / Details: Congential CP, L hemi   Lower Extremity Assessment Lower Extremity Assessment: Defer to PT evaluation   Cervical / Trunk Assessment Cervical / Trunk Assessment: Other exceptions Cervical / Trunk Exceptions: flexed cervical posture.    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, child - like behaviors, perseverates throughout on extraneous stimuli                 Following commands: Impaired Following commands impaired: Follows one step commands with increased time, Follows multi-step commands inconsistently      Cueing   Cueing Techniques: Verbal cues, Gestural cues, Tactile cues, Visual cues  Exercises      Shoulder Instructions       General Comments      Pertinent Vitals/ Pain       Pain Assessment Pain Assessment: No/denies pain  Home Living                                          Prior Functioning/Environment              Frequency  Min 2X/week        Progress Toward Goals  OT Goals(current goals can now be found in the care plan section)  Progress towards OT goals: Progressing toward goals (incrementally)  Acute Rehab OT Goals Patient Stated Goal: to get more onion chips OT  Goal Formulation: With patient Time For Goal Achievement: 03/04/24 Potential to Achieve Goals: Fair  Plan      Co-evaluation      Reason for Co-Treatment: To address functional/ADL transfers;For patient/therapist safety PT goals addressed during session: Mobility/safety with mobility;Balance OT goals addressed during session: ADL's and self-care      AM-PAC OT 6 Clicks Daily Activity     Outcome Measure   Help from another person eating meals?: A Little Help from another person taking care of personal grooming?: A Little Help from another person toileting, which includes using toliet, bedpan, or urinal?: A Lot Help from another person bathing (including washing, rinsing, drying)?: A Lot Help from another person to put on and taking off regular upper body clothing?: A Lot Help from another person to put on and taking off regular lower body clothing?: A Lot 6 Click Score: 14    End of Session Equipment Utilized During Treatment: Gait belt  OT Visit Diagnosis: Unsteadiness on feet (R26.81);Other abnormalities of gait and mobility (R26.89);Pain;Muscle weakness (generalized) (M62.81) Hemiplegia - Right/Left: Left Hemiplegia - dominant/non-dominant: Non-Dominant   Activity Tolerance Patient tolerated treatment well   Patient Left in bed;with call bell/phone within reach;with bed alarm set   Nurse  Communication Mobility status        Time: 8641-8581 OT Time Calculation (min): 20 min  Charges: OT General Charges $OT Visit: 1 Visit Ronal Mallie POUR., COTA/L Acute Rehabilitation Services [phone]   Ronal Mallie Needy 02/20/2024, 3:11 PM

## 2024-02-20 NOTE — Progress Notes (Signed)
     Daily Progress Note Intern Pager: [phone]  Patient name: Patricia White Medical record number: [id number] Date of birth: 08-11-1971 Age: 53 y.o. Gender: female  Primary Care Provider: Alba Sharper, MD Consultants: Neuro (signed off) Code Status: DNR-I  Pt Overview and Major Events to Date:  6/24: Admitted 6/27: Emergency petition for guardianship  Medical Decision Making:  53 year old female presenting with seizure secondary to medication administration, medically stable awaiting safe disposition.  Auth pending.  Pertinent past medical history includes cerebral palsy, asthma and seizures. Assessment & Plan Seizure (HCC) Stable. - Keppra  1500 mg BID - Klonopin  0.5 mg TID - Vimpat  200 mg BID - Perampanel  4 mg daily - Phenytoin  100 mg a.m. and 150 mg p.m., phenytoin  level 10.6 7/7, recheck phenytoin  level 7/14 - Weekly labs on Mondays Chronic health problem HLD: Atorvastatin  40 mg GERD: Protonix  40 mg      FEN/GI: Dysphagia 3 PPx: Lovenox  Dispo: SNF/LTC safe disposition.  Barriers include auth for placement.  Subjective:  NAEON, patient awake, pleasant and communicating this AM.   Objective: Temp:  [98.2 F (36.8 C)-99.1 F (37.3 C)] 98.2 F (36.8 C) (07/09 0413) Pulse Rate:  [76-109] 82 (07/09 0413) Resp:  [16-20] 18 (07/09 0413) BP: (97-115)/(69-81) 101/79 (07/09 0413) SpO2:  [96 %-100 %] 97 % (07/09 0413) Physical Exam: General: Patient is awake, alert, no acute distress. Cardiovascular: RRR, no m/r/g Respiratory: CTA b/l on room air Abdomen: Soft, non tender  Laboratory: Most recent CBC Lab Results  Component Value Date   WBC 6.2 02/18/2024   HGB 10.6 (L) 02/18/2024   HCT 34.3 (L) 02/18/2024   MCV 88.9 02/18/2024   PLT 581 (H) 02/18/2024   Most recent BMP    Latest Ref Rng & Units 02/18/2024    9:52 AM  BMP  Glucose 70 - 99 mg/dL 99   BUN 6 - 20 mg/dL <5   Creatinine 9.55 - 1.00 mg/dL 9.21   Sodium 864 - 854 mmol/L 139   Potassium  3.5 - 5.1 mmol/L 3.7   Chloride 98 - 111 mmol/L 100   CO2 22 - 32 mmol/L 26   Calcium  8.9 - 10.3 mg/dL 9.5     Imaging/Diagnostic Tests: None  Manon Jester, DO 02/20/2024, 7:25 AM  PGY-1, [facility] Family Medicine FPTS Intern pager: [phone], text pages welcome Secure chat group [facility] Teaching Service

## 2024-02-20 NOTE — Discharge Summary (Signed)
 Family Medicine Teaching [facility] Discharge Summary  Patient name: Patricia White Medical record number: [id number] Date of birth: 02/13/1971 Age: 53 y.o. Gender: female Date of Admission: 02/05/2024  Date of Discharge: 02/20/2024 Admitting Physician: Otto ONEIDA Fairly, MD  Primary Care Provider: Alba Sharper, MD Consultants: Neurology  Indication for Hospitalization: Status epilepticus  Discharge Diagnoses/Problem List:  Principal Problem for Admission: Seizure Other Problems addressed during stay:  Principal Problem:   Seizure [facility]) Active Problems:   Chronic health problem   Sepsis [facility])    Brief Hospital Course:  Patricia White is a 53 y.o.female with a history of cerebral palsy, seizure disorder, intellectual disability who was admitted to the [facility] teaching Service at [facility] for multiple seizures and meeting SIRS criteria. Her hospital course is detailed below:  Multiple seizures Patient was recently discharged home from SNF 3 days prior to presentation.  Arrived to the ED status post 7 seizures.  He was found to have low Vimpat  and Keppra  levels.  CT head and C-spine negative.  Neurology consulted due to the patient's nonadherence to AEDs recommended restarting since she was previously well-controlled at SNF.  Further workup revealed that the patient was possibly in status epilepticus at baseline, regularly.  No seizure-like activity noted while inpatient.  She was discharged to SNF for further rehabilitation.  SIRS criteria  Bacteremia Met SIRS criteria with fever to 101, tachypnea, tachycardia.  Patient did not have a leukocytosis on presentation.  CXR negative, blood cultures obtained and showed growth of multiple organisms, likely contaminant due to growth of numerous organisms without consistent symptoms.  She was started on ceftriaxone  on 6/25 which was broadened to Vancomycin  (6/25 - 6/29) and Zosyn  (6/25 - 7/1).  UA was negative for infection.  Quad screen  negative.  Patient remained asymptomatic during admission.  APS Difficult social situation since her 70 year old grandmother takes care of her due to disability secondary to cerebral palsy.  History of APS reports in the past, due to concern of medication nonadherence causing breakthrough seizures.  Report filed on 6/26 and emergency petition for guardianship initiated. Nat Chang, phone number [phone], was granted emergency guardianship before discharge to SNF.  Other chronic conditions were medically managed with home medications and formulary alternatives as necessary (HLD, GERD)  PCP Follow-up Recommendations: Ensure she sees dentist outpatient - poor dentition could be contributing to bacteremia. Ensure Neurology follow-up outpatient. Restart blood pressure medicines as indicated. Held at discharge.    Results/Tests Pending at Time of Discharge:  Unresulted Labs (From admission, onward)     Start     Ordered   02/25/24 0500  Basic metabolic panel with GFR  Once,   R       Question:  Specimen collection method  Answer:  Lab=Lab collect   02/18/24 1028   02/25/24 0500  CBC  Once,   R       Question:  Specimen collection method  Answer:  Lab=Lab collect   02/18/24 1028             Disposition: Skilled nursing facility  Discharge Condition: Stable  Discharge Exam:  Vitals:   02/20/24 0815 02/20/24 1113  BP: 102/73 106/82  Pulse: 80 90  Resp: 20 20  Temp: 98.6 F (37 C) 98.1 F (36.7 C)  SpO2: 100% 100%  Exam per Dr. Chares 02/20/2024 General: Patient is awake, alert, no acute distress. Cardiovascular: RRR, no m/r/g Respiratory: CTA b/l on room air Abdomen: Soft, non tender  Significant Procedures: None  Significant Labs and Imaging:  02/18/2024-Dilantin  level 10.6 02/05/2024-Levetiracetam  level 5.4 02/05/2024-blood culture positive for Staphylococcus hemolyticus, and Pseudomonas aeruginosa  CT head and neck without contrast  02/05/2024 IMPRESSION: 1. No acute intracranial pathology. 2. No fracture or static subluxation of the cervical spine. 3. Disc space heights preserved with anterior bridging osteophytes of the lower cervical and upper thoracic spine, in keeping with DISH.   Discharge Medications:  Allergies as of 02/20/2024   No Known Allergies      Medication List     PAUSE taking these medications    indapamide  1.25 MG tablet Wait to take this until your doctor or other care provider tells you to start again. Commonly known as: LOZOL  Take 1.25 mg by mouth daily.   losartan  100 MG tablet Wait to take this until your doctor or other care provider tells you to start again. Commonly known as: COZAAR  Take 50 mg by mouth at bedtime.       STOP taking these medications    atorvastatin  40 MG tablet Commonly known as: LIPITOR       TAKE these medications    acetaminophen  325 MG tablet Commonly known as: TYLENOL  Take 2 tablets (650 mg total) by mouth every 6 (six) hours as needed for mild pain (pain score 1-3) or fever.   clonazePAM  0.5 MG tablet Commonly known as: KLONOPIN  Take 1 tablet (0.5 mg total) by mouth 3 (three) times daily.   lacosamide  200 MG Tabs tablet Commonly known as: VIMPAT  Take 1 tablet (200 mg total) by mouth 2 (two) times daily.   levETIRAcetam  750 MG tablet Commonly known as: KEPPRA  Take 2 tablets (1,500 mg total) by mouth 2 (two) times daily.   pantoprazole  40 MG tablet Commonly known as: PROTONIX  Take 1 tablet (40 mg total) by mouth daily.   Perampanel  4 MG Tabs Take 1 tablet (4 mg total) by mouth daily.   phenytoin  50 MG tablet Commonly known as: DILANTIN  Chew 2 tablets (100 mg total) by mouth daily AND 3 tablets (150 mg total) at bedtime. What changed: See the new instructions.   polyethylene glycol 17 g packet Commonly known as: MIRALAX  / GLYCOLAX  Take 17 g by mouth daily.   rosuvastatin  20 MG tablet Commonly known as: CRESTOR  Take 1 tablet  (20 mg total) by mouth daily.   senna-docusate 8.6-50 MG tablet Commonly known as: Senokot-S Take 1 tablet by mouth daily.        Discharge Instructions: Please refer to Patient Instructions section of EMR for full details.  Patient was counseled important signs and symptoms that should prompt return to medical care, changes in medications, dietary instructions, activity restrictions, and follow up appointments.   Follow-Up Appointments:  Contact information for follow-up providers     Alba Sharper, MD. Schedule an appointment as soon as possible for a visit.   Specialty: Family Medicine Why: For hospital follow-up ASAP. Contact information: [address] [place] [address] [phone]              Contact information for after-discharge care     Destination     [facility]   Service: Skilled Nursing Contact information: [address] [place]  [address] [phone]                     Alba Sharper, MD 02/20/2024, 2:11 PM PGY-3, [facility] Family Medicine

## 2024-02-20 NOTE — Assessment & Plan Note (Signed)
 HLD: Atorvastatin  40 mg GERD: Protonix  40 mg

## 2024-02-23 DIAGNOSIS — M6281 Muscle weakness (generalized): Secondary | ICD-10-CM | POA: Diagnosis not present

## 2024-02-23 DIAGNOSIS — E1169 Type 2 diabetes mellitus with other specified complication: Secondary | ICD-10-CM | POA: Diagnosis not present

## 2024-02-23 DIAGNOSIS — A419 Sepsis, unspecified organism: Secondary | ICD-10-CM | POA: Diagnosis not present

## 2024-02-23 DIAGNOSIS — Z6823 Body mass index (BMI) 23.0-23.9, adult: Secondary | ICD-10-CM | POA: Diagnosis not present

## 2024-02-23 DIAGNOSIS — K219 Gastro-esophageal reflux disease without esophagitis: Secondary | ICD-10-CM | POA: Diagnosis not present

## 2024-02-23 DIAGNOSIS — G809 Cerebral palsy, unspecified: Secondary | ICD-10-CM | POA: Diagnosis not present

## 2024-02-23 DIAGNOSIS — E785 Hyperlipidemia, unspecified: Secondary | ICD-10-CM | POA: Diagnosis not present

## 2024-02-23 DIAGNOSIS — R1310 Dysphagia, unspecified: Secondary | ICD-10-CM | POA: Diagnosis not present

## 2024-02-23 DIAGNOSIS — R262 Difficulty in walking, not elsewhere classified: Secondary | ICD-10-CM | POA: Diagnosis not present

## 2024-02-25 DIAGNOSIS — G809 Cerebral palsy, unspecified: Secondary | ICD-10-CM | POA: Diagnosis not present

## 2024-02-25 DIAGNOSIS — E785 Hyperlipidemia, unspecified: Secondary | ICD-10-CM | POA: Diagnosis not present

## 2024-02-25 DIAGNOSIS — A419 Sepsis, unspecified organism: Secondary | ICD-10-CM | POA: Diagnosis not present

## 2024-02-25 DIAGNOSIS — G808 Other cerebral palsy: Secondary | ICD-10-CM | POA: Diagnosis not present

## 2024-02-25 DIAGNOSIS — R569 Unspecified convulsions: Secondary | ICD-10-CM | POA: Diagnosis not present

## 2024-02-25 DIAGNOSIS — R1310 Dysphagia, unspecified: Secondary | ICD-10-CM | POA: Diagnosis not present

## 2024-02-25 DIAGNOSIS — Z789 Other specified health status: Secondary | ICD-10-CM | POA: Diagnosis not present

## 2024-02-25 DIAGNOSIS — E1169 Type 2 diabetes mellitus with other specified complication: Secondary | ICD-10-CM | POA: Diagnosis not present

## 2024-02-26 DIAGNOSIS — Z789 Other specified health status: Secondary | ICD-10-CM | POA: Diagnosis not present

## 2024-02-26 DIAGNOSIS — G808 Other cerebral palsy: Secondary | ICD-10-CM | POA: Diagnosis not present

## 2024-02-26 DIAGNOSIS — A419 Sepsis, unspecified organism: Secondary | ICD-10-CM | POA: Diagnosis not present

## 2024-02-26 DIAGNOSIS — G809 Cerebral palsy, unspecified: Secondary | ICD-10-CM | POA: Diagnosis not present

## 2024-02-26 DIAGNOSIS — E785 Hyperlipidemia, unspecified: Secondary | ICD-10-CM | POA: Diagnosis not present

## 2024-02-26 DIAGNOSIS — E1169 Type 2 diabetes mellitus with other specified complication: Secondary | ICD-10-CM | POA: Diagnosis not present

## 2024-02-26 DIAGNOSIS — R569 Unspecified convulsions: Secondary | ICD-10-CM | POA: Diagnosis not present

## 2024-02-26 DIAGNOSIS — R1310 Dysphagia, unspecified: Secondary | ICD-10-CM | POA: Diagnosis not present

## 2024-02-27 DIAGNOSIS — G808 Other cerebral palsy: Secondary | ICD-10-CM | POA: Diagnosis not present

## 2024-02-27 DIAGNOSIS — G809 Cerebral palsy, unspecified: Secondary | ICD-10-CM | POA: Diagnosis not present

## 2024-02-27 DIAGNOSIS — E785 Hyperlipidemia, unspecified: Secondary | ICD-10-CM | POA: Diagnosis not present

## 2024-02-27 DIAGNOSIS — Z789 Other specified health status: Secondary | ICD-10-CM | POA: Diagnosis not present

## 2024-02-27 DIAGNOSIS — R1310 Dysphagia, unspecified: Secondary | ICD-10-CM | POA: Diagnosis not present

## 2024-02-27 DIAGNOSIS — R569 Unspecified convulsions: Secondary | ICD-10-CM | POA: Diagnosis not present

## 2024-02-27 DIAGNOSIS — E1169 Type 2 diabetes mellitus with other specified complication: Secondary | ICD-10-CM | POA: Diagnosis not present

## 2024-02-27 DIAGNOSIS — A419 Sepsis, unspecified organism: Secondary | ICD-10-CM | POA: Diagnosis not present

## 2024-02-29 DIAGNOSIS — G809 Cerebral palsy, unspecified: Secondary | ICD-10-CM | POA: Diagnosis not present

## 2024-02-29 DIAGNOSIS — R569 Unspecified convulsions: Secondary | ICD-10-CM | POA: Diagnosis not present

## 2024-02-29 DIAGNOSIS — Z789 Other specified health status: Secondary | ICD-10-CM | POA: Diagnosis not present

## 2024-02-29 DIAGNOSIS — R1310 Dysphagia, unspecified: Secondary | ICD-10-CM | POA: Diagnosis not present

## 2024-02-29 DIAGNOSIS — E785 Hyperlipidemia, unspecified: Secondary | ICD-10-CM | POA: Diagnosis not present

## 2024-02-29 DIAGNOSIS — A419 Sepsis, unspecified organism: Secondary | ICD-10-CM | POA: Diagnosis not present

## 2024-02-29 DIAGNOSIS — E1169 Type 2 diabetes mellitus with other specified complication: Secondary | ICD-10-CM | POA: Diagnosis not present

## 2024-02-29 DIAGNOSIS — G808 Other cerebral palsy: Secondary | ICD-10-CM | POA: Diagnosis not present

## 2024-03-03 DIAGNOSIS — E1169 Type 2 diabetes mellitus with other specified complication: Secondary | ICD-10-CM | POA: Diagnosis not present

## 2024-03-03 DIAGNOSIS — G808 Other cerebral palsy: Secondary | ICD-10-CM | POA: Diagnosis not present

## 2024-03-03 DIAGNOSIS — R569 Unspecified convulsions: Secondary | ICD-10-CM | POA: Diagnosis not present

## 2024-03-03 DIAGNOSIS — G809 Cerebral palsy, unspecified: Secondary | ICD-10-CM | POA: Diagnosis not present

## 2024-03-03 DIAGNOSIS — Z789 Other specified health status: Secondary | ICD-10-CM | POA: Diagnosis not present

## 2024-03-03 DIAGNOSIS — R1310 Dysphagia, unspecified: Secondary | ICD-10-CM | POA: Diagnosis not present

## 2024-03-03 DIAGNOSIS — A419 Sepsis, unspecified organism: Secondary | ICD-10-CM | POA: Diagnosis not present

## 2024-03-03 DIAGNOSIS — E785 Hyperlipidemia, unspecified: Secondary | ICD-10-CM | POA: Diagnosis not present

## 2024-03-05 DIAGNOSIS — R1310 Dysphagia, unspecified: Secondary | ICD-10-CM | POA: Diagnosis not present

## 2024-03-05 DIAGNOSIS — E785 Hyperlipidemia, unspecified: Secondary | ICD-10-CM | POA: Diagnosis not present

## 2024-03-05 DIAGNOSIS — A419 Sepsis, unspecified organism: Secondary | ICD-10-CM | POA: Diagnosis not present

## 2024-03-05 DIAGNOSIS — E1169 Type 2 diabetes mellitus with other specified complication: Secondary | ICD-10-CM | POA: Diagnosis not present

## 2024-03-05 DIAGNOSIS — R569 Unspecified convulsions: Secondary | ICD-10-CM | POA: Diagnosis not present

## 2024-03-05 DIAGNOSIS — Z789 Other specified health status: Secondary | ICD-10-CM | POA: Diagnosis not present

## 2024-03-05 DIAGNOSIS — G808 Other cerebral palsy: Secondary | ICD-10-CM | POA: Diagnosis not present

## 2024-03-05 DIAGNOSIS — G809 Cerebral palsy, unspecified: Secondary | ICD-10-CM | POA: Diagnosis not present

## 2024-03-07 DIAGNOSIS — G808 Other cerebral palsy: Secondary | ICD-10-CM | POA: Diagnosis not present

## 2024-03-07 DIAGNOSIS — G809 Cerebral palsy, unspecified: Secondary | ICD-10-CM | POA: Diagnosis not present

## 2024-03-07 DIAGNOSIS — R1310 Dysphagia, unspecified: Secondary | ICD-10-CM | POA: Diagnosis not present

## 2024-03-07 DIAGNOSIS — E785 Hyperlipidemia, unspecified: Secondary | ICD-10-CM | POA: Diagnosis not present

## 2024-03-07 DIAGNOSIS — A419 Sepsis, unspecified organism: Secondary | ICD-10-CM | POA: Diagnosis not present

## 2024-03-07 DIAGNOSIS — Z789 Other specified health status: Secondary | ICD-10-CM | POA: Diagnosis not present

## 2024-03-07 DIAGNOSIS — E1169 Type 2 diabetes mellitus with other specified complication: Secondary | ICD-10-CM | POA: Diagnosis not present

## 2024-03-07 DIAGNOSIS — R569 Unspecified convulsions: Secondary | ICD-10-CM | POA: Diagnosis not present

## 2024-03-10 DIAGNOSIS — G808 Other cerebral palsy: Secondary | ICD-10-CM | POA: Diagnosis not present

## 2024-03-10 DIAGNOSIS — G809 Cerebral palsy, unspecified: Secondary | ICD-10-CM | POA: Diagnosis not present

## 2024-03-10 DIAGNOSIS — Z789 Other specified health status: Secondary | ICD-10-CM | POA: Diagnosis not present

## 2024-03-10 DIAGNOSIS — R1310 Dysphagia, unspecified: Secondary | ICD-10-CM | POA: Diagnosis not present

## 2024-03-10 DIAGNOSIS — E1169 Type 2 diabetes mellitus with other specified complication: Secondary | ICD-10-CM | POA: Diagnosis not present

## 2024-03-10 DIAGNOSIS — R569 Unspecified convulsions: Secondary | ICD-10-CM | POA: Diagnosis not present

## 2024-03-10 DIAGNOSIS — E785 Hyperlipidemia, unspecified: Secondary | ICD-10-CM | POA: Diagnosis not present

## 2024-03-10 DIAGNOSIS — A419 Sepsis, unspecified organism: Secondary | ICD-10-CM | POA: Diagnosis not present

## 2024-03-12 DIAGNOSIS — G809 Cerebral palsy, unspecified: Secondary | ICD-10-CM | POA: Diagnosis not present

## 2024-03-12 DIAGNOSIS — R569 Unspecified convulsions: Secondary | ICD-10-CM | POA: Diagnosis not present

## 2024-03-12 DIAGNOSIS — G808 Other cerebral palsy: Secondary | ICD-10-CM | POA: Diagnosis not present

## 2024-03-12 DIAGNOSIS — E785 Hyperlipidemia, unspecified: Secondary | ICD-10-CM | POA: Diagnosis not present

## 2024-03-12 DIAGNOSIS — A419 Sepsis, unspecified organism: Secondary | ICD-10-CM | POA: Diagnosis not present

## 2024-03-12 DIAGNOSIS — R1310 Dysphagia, unspecified: Secondary | ICD-10-CM | POA: Diagnosis not present

## 2024-03-12 DIAGNOSIS — Z789 Other specified health status: Secondary | ICD-10-CM | POA: Diagnosis not present

## 2024-03-12 DIAGNOSIS — E1169 Type 2 diabetes mellitus with other specified complication: Secondary | ICD-10-CM | POA: Diagnosis not present

## 2024-03-13 DIAGNOSIS — R569 Unspecified convulsions: Secondary | ICD-10-CM | POA: Diagnosis not present

## 2024-03-13 DIAGNOSIS — G808 Other cerebral palsy: Secondary | ICD-10-CM | POA: Diagnosis not present

## 2024-03-13 DIAGNOSIS — G809 Cerebral palsy, unspecified: Secondary | ICD-10-CM | POA: Diagnosis not present

## 2024-03-13 DIAGNOSIS — R1310 Dysphagia, unspecified: Secondary | ICD-10-CM | POA: Diagnosis not present

## 2024-03-13 DIAGNOSIS — A419 Sepsis, unspecified organism: Secondary | ICD-10-CM | POA: Diagnosis not present

## 2024-03-13 DIAGNOSIS — E1169 Type 2 diabetes mellitus with other specified complication: Secondary | ICD-10-CM | POA: Diagnosis not present

## 2024-03-13 DIAGNOSIS — E785 Hyperlipidemia, unspecified: Secondary | ICD-10-CM | POA: Diagnosis not present

## 2024-03-13 DIAGNOSIS — Z789 Other specified health status: Secondary | ICD-10-CM | POA: Diagnosis not present

## 2024-03-14 DIAGNOSIS — R569 Unspecified convulsions: Secondary | ICD-10-CM | POA: Diagnosis not present

## 2024-03-14 DIAGNOSIS — E785 Hyperlipidemia, unspecified: Secondary | ICD-10-CM | POA: Diagnosis not present

## 2024-03-14 DIAGNOSIS — R1310 Dysphagia, unspecified: Secondary | ICD-10-CM | POA: Diagnosis not present

## 2024-03-14 DIAGNOSIS — G808 Other cerebral palsy: Secondary | ICD-10-CM | POA: Diagnosis not present

## 2024-03-14 DIAGNOSIS — A419 Sepsis, unspecified organism: Secondary | ICD-10-CM | POA: Diagnosis not present

## 2024-03-14 DIAGNOSIS — Z789 Other specified health status: Secondary | ICD-10-CM | POA: Diagnosis not present

## 2024-03-14 DIAGNOSIS — G809 Cerebral palsy, unspecified: Secondary | ICD-10-CM | POA: Diagnosis not present

## 2024-03-14 DIAGNOSIS — E1169 Type 2 diabetes mellitus with other specified complication: Secondary | ICD-10-CM | POA: Diagnosis not present

## 2024-03-17 DIAGNOSIS — A419 Sepsis, unspecified organism: Secondary | ICD-10-CM | POA: Diagnosis not present

## 2024-03-17 DIAGNOSIS — E1169 Type 2 diabetes mellitus with other specified complication: Secondary | ICD-10-CM | POA: Diagnosis not present

## 2024-03-17 DIAGNOSIS — R569 Unspecified convulsions: Secondary | ICD-10-CM | POA: Diagnosis not present

## 2024-03-17 DIAGNOSIS — Z789 Other specified health status: Secondary | ICD-10-CM | POA: Diagnosis not present

## 2024-03-17 DIAGNOSIS — R1310 Dysphagia, unspecified: Secondary | ICD-10-CM | POA: Diagnosis not present

## 2024-03-17 DIAGNOSIS — G809 Cerebral palsy, unspecified: Secondary | ICD-10-CM | POA: Diagnosis not present

## 2024-03-17 DIAGNOSIS — D61818 Other pancytopenia: Secondary | ICD-10-CM | POA: Diagnosis not present

## 2024-03-17 DIAGNOSIS — E860 Dehydration: Secondary | ICD-10-CM | POA: Diagnosis not present

## 2024-03-17 DIAGNOSIS — E785 Hyperlipidemia, unspecified: Secondary | ICD-10-CM | POA: Diagnosis not present

## 2024-03-17 DIAGNOSIS — G808 Other cerebral palsy: Secondary | ICD-10-CM | POA: Diagnosis not present

## 2024-03-19 DIAGNOSIS — M6281 Muscle weakness (generalized): Secondary | ICD-10-CM | POA: Diagnosis not present

## 2024-03-19 DIAGNOSIS — G808 Other cerebral palsy: Secondary | ICD-10-CM | POA: Diagnosis not present

## 2024-03-19 DIAGNOSIS — R000 Tachycardia, unspecified: Secondary | ICD-10-CM | POA: Diagnosis not present

## 2024-03-19 DIAGNOSIS — R5381 Other malaise: Secondary | ICD-10-CM | POA: Diagnosis not present

## 2024-03-19 DIAGNOSIS — G809 Cerebral palsy, unspecified: Secondary | ICD-10-CM | POA: Diagnosis not present

## 2024-03-19 DIAGNOSIS — Z741 Need for assistance with personal care: Secondary | ICD-10-CM | POA: Diagnosis not present

## 2024-03-19 DIAGNOSIS — R1312 Dysphagia, oropharyngeal phase: Secondary | ICD-10-CM | POA: Diagnosis not present

## 2024-03-19 DIAGNOSIS — Z789 Other specified health status: Secondary | ICD-10-CM | POA: Diagnosis not present

## 2024-03-19 DIAGNOSIS — E1169 Type 2 diabetes mellitus with other specified complication: Secondary | ICD-10-CM | POA: Diagnosis not present

## 2024-03-19 DIAGNOSIS — R471 Dysarthria and anarthria: Secondary | ICD-10-CM | POA: Diagnosis not present

## 2024-03-19 DIAGNOSIS — R1310 Dysphagia, unspecified: Secondary | ICD-10-CM | POA: Diagnosis not present

## 2024-03-19 DIAGNOSIS — R2681 Unsteadiness on feet: Secondary | ICD-10-CM | POA: Diagnosis not present

## 2024-03-19 DIAGNOSIS — E43 Unspecified severe protein-calorie malnutrition: Secondary | ICD-10-CM | POA: Diagnosis not present

## 2024-03-19 DIAGNOSIS — A419 Sepsis, unspecified organism: Secondary | ICD-10-CM | POA: Diagnosis not present

## 2024-03-19 DIAGNOSIS — R569 Unspecified convulsions: Secondary | ICD-10-CM | POA: Diagnosis not present

## 2024-03-19 DIAGNOSIS — E785 Hyperlipidemia, unspecified: Secondary | ICD-10-CM | POA: Diagnosis not present

## 2024-03-20 DIAGNOSIS — R451 Restlessness and agitation: Secondary | ICD-10-CM | POA: Diagnosis not present

## 2024-03-20 DIAGNOSIS — I10 Essential (primary) hypertension: Secondary | ICD-10-CM | POA: Diagnosis not present

## 2024-03-20 DIAGNOSIS — R627 Adult failure to thrive: Secondary | ICD-10-CM | POA: Diagnosis not present

## 2024-03-20 DIAGNOSIS — J45909 Unspecified asthma, uncomplicated: Secondary | ICD-10-CM | POA: Diagnosis not present

## 2024-03-20 DIAGNOSIS — E119 Type 2 diabetes mellitus without complications: Secondary | ICD-10-CM | POA: Diagnosis not present

## 2024-03-20 DIAGNOSIS — G808 Other cerebral palsy: Secondary | ICD-10-CM | POA: Diagnosis not present

## 2024-03-20 DIAGNOSIS — E43 Unspecified severe protein-calorie malnutrition: Secondary | ICD-10-CM | POA: Diagnosis not present

## 2024-03-20 DIAGNOSIS — E785 Hyperlipidemia, unspecified: Secondary | ICD-10-CM | POA: Diagnosis not present

## 2024-03-20 DIAGNOSIS — K5900 Constipation, unspecified: Secondary | ICD-10-CM | POA: Diagnosis not present

## 2024-03-20 DIAGNOSIS — E038 Other specified hypothyroidism: Secondary | ICD-10-CM | POA: Diagnosis not present

## 2024-03-21 DIAGNOSIS — R296 Repeated falls: Secondary | ICD-10-CM | POA: Diagnosis not present

## 2024-03-26 ENCOUNTER — Other Ambulatory Visit: Payer: Self-pay | Admitting: Family Medicine

## 2024-03-28 DIAGNOSIS — I10 Essential (primary) hypertension: Secondary | ICD-10-CM | POA: Diagnosis not present

## 2024-03-28 DIAGNOSIS — E785 Hyperlipidemia, unspecified: Secondary | ICD-10-CM | POA: Diagnosis not present

## 2024-03-28 DIAGNOSIS — J45909 Unspecified asthma, uncomplicated: Secondary | ICD-10-CM | POA: Diagnosis not present

## 2024-03-28 DIAGNOSIS — E038 Other specified hypothyroidism: Secondary | ICD-10-CM | POA: Diagnosis not present

## 2024-03-28 DIAGNOSIS — E43 Unspecified severe protein-calorie malnutrition: Secondary | ICD-10-CM | POA: Diagnosis not present

## 2024-03-28 DIAGNOSIS — E119 Type 2 diabetes mellitus without complications: Secondary | ICD-10-CM | POA: Diagnosis not present

## 2024-03-28 DIAGNOSIS — R451 Restlessness and agitation: Secondary | ICD-10-CM | POA: Diagnosis not present

## 2024-03-28 DIAGNOSIS — R627 Adult failure to thrive: Secondary | ICD-10-CM | POA: Diagnosis not present

## 2024-04-04 DIAGNOSIS — R1311 Dysphagia, oral phase: Secondary | ICD-10-CM | POA: Diagnosis not present

## 2024-04-04 DIAGNOSIS — G808 Other cerebral palsy: Secondary | ICD-10-CM | POA: Diagnosis not present

## 2024-04-04 DIAGNOSIS — Z741 Need for assistance with personal care: Secondary | ICD-10-CM | POA: Diagnosis not present

## 2024-04-08 DIAGNOSIS — R627 Adult failure to thrive: Secondary | ICD-10-CM | POA: Diagnosis not present

## 2024-04-08 DIAGNOSIS — R451 Restlessness and agitation: Secondary | ICD-10-CM | POA: Diagnosis not present

## 2024-04-08 DIAGNOSIS — J45909 Unspecified asthma, uncomplicated: Secondary | ICD-10-CM | POA: Diagnosis not present

## 2024-04-08 DIAGNOSIS — E119 Type 2 diabetes mellitus without complications: Secondary | ICD-10-CM | POA: Diagnosis not present

## 2024-04-08 DIAGNOSIS — R296 Repeated falls: Secondary | ICD-10-CM | POA: Diagnosis not present

## 2024-04-08 DIAGNOSIS — E43 Unspecified severe protein-calorie malnutrition: Secondary | ICD-10-CM | POA: Diagnosis not present

## 2024-04-08 DIAGNOSIS — R1311 Dysphagia, oral phase: Secondary | ICD-10-CM | POA: Diagnosis not present

## 2024-04-08 DIAGNOSIS — Z741 Need for assistance with personal care: Secondary | ICD-10-CM | POA: Diagnosis not present

## 2024-04-08 DIAGNOSIS — G808 Other cerebral palsy: Secondary | ICD-10-CM | POA: Diagnosis not present

## 2024-04-08 DIAGNOSIS — I10 Essential (primary) hypertension: Secondary | ICD-10-CM | POA: Diagnosis not present

## 2024-04-09 DIAGNOSIS — R296 Repeated falls: Secondary | ICD-10-CM | POA: Diagnosis not present

## 2024-04-09 DIAGNOSIS — I10 Essential (primary) hypertension: Secondary | ICD-10-CM | POA: Diagnosis not present

## 2024-04-10 DIAGNOSIS — R451 Restlessness and agitation: Secondary | ICD-10-CM | POA: Diagnosis not present

## 2024-04-10 DIAGNOSIS — E119 Type 2 diabetes mellitus without complications: Secondary | ICD-10-CM | POA: Diagnosis not present

## 2024-04-10 DIAGNOSIS — G808 Other cerebral palsy: Secondary | ICD-10-CM | POA: Diagnosis not present

## 2024-04-10 DIAGNOSIS — R1311 Dysphagia, oral phase: Secondary | ICD-10-CM | POA: Diagnosis not present

## 2024-04-10 DIAGNOSIS — I10 Essential (primary) hypertension: Secondary | ICD-10-CM | POA: Diagnosis not present

## 2024-04-10 DIAGNOSIS — E43 Unspecified severe protein-calorie malnutrition: Secondary | ICD-10-CM | POA: Diagnosis not present

## 2024-04-10 DIAGNOSIS — Z741 Need for assistance with personal care: Secondary | ICD-10-CM | POA: Diagnosis not present

## 2024-04-10 DIAGNOSIS — R296 Repeated falls: Secondary | ICD-10-CM | POA: Diagnosis not present

## 2024-04-10 DIAGNOSIS — R627 Adult failure to thrive: Secondary | ICD-10-CM | POA: Diagnosis not present

## 2024-04-10 DIAGNOSIS — J45909 Unspecified asthma, uncomplicated: Secondary | ICD-10-CM | POA: Diagnosis not present

## 2024-04-13 DIAGNOSIS — G808 Other cerebral palsy: Secondary | ICD-10-CM | POA: Diagnosis not present

## 2024-04-13 DIAGNOSIS — R1311 Dysphagia, oral phase: Secondary | ICD-10-CM | POA: Diagnosis not present

## 2024-04-13 DIAGNOSIS — Z741 Need for assistance with personal care: Secondary | ICD-10-CM | POA: Diagnosis not present

## 2024-04-14 DIAGNOSIS — R569 Unspecified convulsions: Secondary | ICD-10-CM | POA: Diagnosis not present

## 2024-04-14 DIAGNOSIS — R1311 Dysphagia, oral phase: Secondary | ICD-10-CM | POA: Diagnosis not present

## 2024-04-14 DIAGNOSIS — G808 Other cerebral palsy: Secondary | ICD-10-CM | POA: Diagnosis not present

## 2024-04-14 DIAGNOSIS — M6281 Muscle weakness (generalized): Secondary | ICD-10-CM | POA: Diagnosis not present

## 2024-04-14 DIAGNOSIS — Z741 Need for assistance with personal care: Secondary | ICD-10-CM | POA: Diagnosis not present

## 2024-04-15 DIAGNOSIS — G808 Other cerebral palsy: Secondary | ICD-10-CM | POA: Diagnosis not present

## 2024-04-15 DIAGNOSIS — Z741 Need for assistance with personal care: Secondary | ICD-10-CM | POA: Diagnosis not present

## 2024-04-15 DIAGNOSIS — M6281 Muscle weakness (generalized): Secondary | ICD-10-CM | POA: Diagnosis not present

## 2024-04-15 DIAGNOSIS — R1311 Dysphagia, oral phase: Secondary | ICD-10-CM | POA: Diagnosis not present

## 2024-04-16 DIAGNOSIS — G808 Other cerebral palsy: Secondary | ICD-10-CM | POA: Diagnosis not present

## 2024-04-16 DIAGNOSIS — Z741 Need for assistance with personal care: Secondary | ICD-10-CM | POA: Diagnosis not present

## 2024-04-16 DIAGNOSIS — M6281 Muscle weakness (generalized): Secondary | ICD-10-CM | POA: Diagnosis not present

## 2024-04-16 DIAGNOSIS — R1311 Dysphagia, oral phase: Secondary | ICD-10-CM | POA: Diagnosis not present

## 2024-04-17 DIAGNOSIS — M6281 Muscle weakness (generalized): Secondary | ICD-10-CM | POA: Diagnosis not present

## 2024-04-17 DIAGNOSIS — Z741 Need for assistance with personal care: Secondary | ICD-10-CM | POA: Diagnosis not present

## 2024-04-17 DIAGNOSIS — R1311 Dysphagia, oral phase: Secondary | ICD-10-CM | POA: Diagnosis not present

## 2024-04-17 DIAGNOSIS — R296 Repeated falls: Secondary | ICD-10-CM | POA: Diagnosis not present

## 2024-04-17 DIAGNOSIS — R5381 Other malaise: Secondary | ICD-10-CM | POA: Diagnosis not present

## 2024-04-17 DIAGNOSIS — G809 Cerebral palsy, unspecified: Secondary | ICD-10-CM | POA: Diagnosis not present

## 2024-04-17 DIAGNOSIS — E860 Dehydration: Secondary | ICD-10-CM | POA: Diagnosis not present

## 2024-04-17 DIAGNOSIS — G808 Other cerebral palsy: Secondary | ICD-10-CM | POA: Diagnosis not present

## 2024-04-18 DIAGNOSIS — R1311 Dysphagia, oral phase: Secondary | ICD-10-CM | POA: Diagnosis not present

## 2024-04-18 DIAGNOSIS — G808 Other cerebral palsy: Secondary | ICD-10-CM | POA: Diagnosis not present

## 2024-04-18 DIAGNOSIS — Z741 Need for assistance with personal care: Secondary | ICD-10-CM | POA: Diagnosis not present

## 2024-04-18 DIAGNOSIS — M6281 Muscle weakness (generalized): Secondary | ICD-10-CM | POA: Diagnosis not present

## 2024-04-21 DIAGNOSIS — M6281 Muscle weakness (generalized): Secondary | ICD-10-CM | POA: Diagnosis not present

## 2024-04-21 DIAGNOSIS — G808 Other cerebral palsy: Secondary | ICD-10-CM | POA: Diagnosis not present

## 2024-04-21 DIAGNOSIS — R1311 Dysphagia, oral phase: Secondary | ICD-10-CM | POA: Diagnosis not present

## 2024-04-21 DIAGNOSIS — Z741 Need for assistance with personal care: Secondary | ICD-10-CM | POA: Diagnosis not present

## 2024-04-22 ENCOUNTER — Other Ambulatory Visit: Payer: Self-pay | Admitting: Family Medicine

## 2024-04-22 DIAGNOSIS — Z741 Need for assistance with personal care: Secondary | ICD-10-CM | POA: Diagnosis not present

## 2024-04-22 DIAGNOSIS — E1169 Type 2 diabetes mellitus with other specified complication: Secondary | ICD-10-CM

## 2024-04-22 DIAGNOSIS — G808 Other cerebral palsy: Secondary | ICD-10-CM | POA: Diagnosis not present

## 2024-04-22 DIAGNOSIS — R1311 Dysphagia, oral phase: Secondary | ICD-10-CM | POA: Diagnosis not present

## 2024-04-22 DIAGNOSIS — M6281 Muscle weakness (generalized): Secondary | ICD-10-CM | POA: Diagnosis not present

## 2024-04-23 DIAGNOSIS — Z741 Need for assistance with personal care: Secondary | ICD-10-CM | POA: Diagnosis not present

## 2024-04-23 DIAGNOSIS — M6281 Muscle weakness (generalized): Secondary | ICD-10-CM | POA: Diagnosis not present

## 2024-04-23 DIAGNOSIS — R1311 Dysphagia, oral phase: Secondary | ICD-10-CM | POA: Diagnosis not present

## 2024-04-23 DIAGNOSIS — G808 Other cerebral palsy: Secondary | ICD-10-CM | POA: Diagnosis not present

## 2024-04-23 DIAGNOSIS — R569 Unspecified convulsions: Secondary | ICD-10-CM | POA: Diagnosis not present

## 2024-04-24 DIAGNOSIS — R1311 Dysphagia, oral phase: Secondary | ICD-10-CM | POA: Diagnosis not present

## 2024-04-24 DIAGNOSIS — Z741 Need for assistance with personal care: Secondary | ICD-10-CM | POA: Diagnosis not present

## 2024-04-24 DIAGNOSIS — M6281 Muscle weakness (generalized): Secondary | ICD-10-CM | POA: Diagnosis not present

## 2024-04-24 DIAGNOSIS — G808 Other cerebral palsy: Secondary | ICD-10-CM | POA: Diagnosis not present

## 2024-04-25 DIAGNOSIS — R1311 Dysphagia, oral phase: Secondary | ICD-10-CM | POA: Diagnosis not present

## 2024-04-25 DIAGNOSIS — Z741 Need for assistance with personal care: Secondary | ICD-10-CM | POA: Diagnosis not present

## 2024-04-25 DIAGNOSIS — G808 Other cerebral palsy: Secondary | ICD-10-CM | POA: Diagnosis not present

## 2024-04-25 DIAGNOSIS — M6281 Muscle weakness (generalized): Secondary | ICD-10-CM | POA: Diagnosis not present

## 2024-04-27 DIAGNOSIS — R296 Repeated falls: Secondary | ICD-10-CM | POA: Diagnosis not present

## 2024-04-28 DIAGNOSIS — M25522 Pain in left elbow: Secondary | ICD-10-CM | POA: Diagnosis not present

## 2024-04-29 DIAGNOSIS — G808 Other cerebral palsy: Secondary | ICD-10-CM | POA: Diagnosis not present

## 2024-04-29 DIAGNOSIS — R1311 Dysphagia, oral phase: Secondary | ICD-10-CM | POA: Diagnosis not present

## 2024-04-29 DIAGNOSIS — M6281 Muscle weakness (generalized): Secondary | ICD-10-CM | POA: Diagnosis not present

## 2024-04-29 DIAGNOSIS — Z741 Need for assistance with personal care: Secondary | ICD-10-CM | POA: Diagnosis not present

## 2024-04-30 DIAGNOSIS — I10 Essential (primary) hypertension: Secondary | ICD-10-CM | POA: Diagnosis not present

## 2024-04-30 DIAGNOSIS — R451 Restlessness and agitation: Secondary | ICD-10-CM | POA: Diagnosis not present

## 2024-04-30 DIAGNOSIS — E43 Unspecified severe protein-calorie malnutrition: Secondary | ICD-10-CM | POA: Diagnosis not present

## 2024-04-30 DIAGNOSIS — G808 Other cerebral palsy: Secondary | ICD-10-CM | POA: Diagnosis not present

## 2024-04-30 DIAGNOSIS — R296 Repeated falls: Secondary | ICD-10-CM | POA: Diagnosis not present

## 2024-04-30 DIAGNOSIS — J45909 Unspecified asthma, uncomplicated: Secondary | ICD-10-CM | POA: Diagnosis not present

## 2024-04-30 DIAGNOSIS — M6281 Muscle weakness (generalized): Secondary | ICD-10-CM | POA: Diagnosis not present

## 2024-04-30 DIAGNOSIS — R627 Adult failure to thrive: Secondary | ICD-10-CM | POA: Diagnosis not present

## 2024-04-30 DIAGNOSIS — R1311 Dysphagia, oral phase: Secondary | ICD-10-CM | POA: Diagnosis not present

## 2024-04-30 DIAGNOSIS — Z741 Need for assistance with personal care: Secondary | ICD-10-CM | POA: Diagnosis not present

## 2024-05-01 DIAGNOSIS — M6281 Muscle weakness (generalized): Secondary | ICD-10-CM | POA: Diagnosis not present

## 2024-05-01 DIAGNOSIS — R1311 Dysphagia, oral phase: Secondary | ICD-10-CM | POA: Diagnosis not present

## 2024-05-01 DIAGNOSIS — Z741 Need for assistance with personal care: Secondary | ICD-10-CM | POA: Diagnosis not present

## 2024-05-01 DIAGNOSIS — G808 Other cerebral palsy: Secondary | ICD-10-CM | POA: Diagnosis not present

## 2024-05-02 ENCOUNTER — Encounter (HOSPITAL_COMMUNITY): Payer: Self-pay

## 2024-05-02 ENCOUNTER — Emergency Department (HOSPITAL_COMMUNITY)

## 2024-05-02 ENCOUNTER — Inpatient Hospital Stay (HOSPITAL_COMMUNITY)
Admission: EM | Admit: 2024-05-02 | Discharge: 2024-05-07 | DRG: 093 | Disposition: A | Discharge status: Hospice | Source: Skilled Nursing Facility | Attending: Family Medicine | Admitting: Family Medicine

## 2024-05-02 ENCOUNTER — Other Ambulatory Visit: Payer: Self-pay

## 2024-05-02 DIAGNOSIS — J302 Other seasonal allergic rhinitis: Secondary | ICD-10-CM | POA: Diagnosis not present

## 2024-05-02 DIAGNOSIS — T420X5A Adverse effect of hydantoin derivatives, initial encounter: Secondary | ICD-10-CM | POA: Diagnosis present

## 2024-05-02 DIAGNOSIS — R451 Restlessness and agitation: Secondary | ICD-10-CM | POA: Diagnosis not present

## 2024-05-02 DIAGNOSIS — I10 Essential (primary) hypertension: Secondary | ICD-10-CM | POA: Diagnosis present

## 2024-05-02 DIAGNOSIS — R7889 Finding of other specified substances, not normally found in blood: Secondary | ICD-10-CM | POA: Insufficient documentation

## 2024-05-02 DIAGNOSIS — Z800 Family history of malignant neoplasm of digestive organs: Secondary | ICD-10-CM | POA: Diagnosis not present

## 2024-05-02 DIAGNOSIS — Z8249 Family history of ischemic heart disease and other diseases of the circulatory system: Secondary | ICD-10-CM | POA: Diagnosis not present

## 2024-05-02 DIAGNOSIS — T68XXXA Hypothermia, initial encounter: Secondary | ICD-10-CM

## 2024-05-02 DIAGNOSIS — Z789 Other specified health status: Secondary | ICD-10-CM

## 2024-05-02 DIAGNOSIS — E038 Other specified hypothyroidism: Secondary | ICD-10-CM | POA: Diagnosis present

## 2024-05-02 DIAGNOSIS — R680 Hypothermia, not associated with low environmental temperature: Secondary | ICD-10-CM | POA: Diagnosis present

## 2024-05-02 DIAGNOSIS — D649 Anemia, unspecified: Secondary | ICD-10-CM | POA: Diagnosis present

## 2024-05-02 DIAGNOSIS — G809 Cerebral palsy, unspecified: Secondary | ICD-10-CM | POA: Diagnosis not present

## 2024-05-02 DIAGNOSIS — G808 Other cerebral palsy: Secondary | ICD-10-CM | POA: Diagnosis not present

## 2024-05-02 DIAGNOSIS — K5900 Constipation, unspecified: Secondary | ICD-10-CM | POA: Diagnosis not present

## 2024-05-02 DIAGNOSIS — R519 Headache, unspecified: Secondary | ICD-10-CM | POA: Diagnosis not present

## 2024-05-02 DIAGNOSIS — E785 Hyperlipidemia, unspecified: Secondary | ICD-10-CM | POA: Diagnosis not present

## 2024-05-02 DIAGNOSIS — Z833 Family history of diabetes mellitus: Secondary | ICD-10-CM | POA: Diagnosis not present

## 2024-05-02 DIAGNOSIS — J45909 Unspecified asthma, uncomplicated: Secondary | ICD-10-CM | POA: Diagnosis not present

## 2024-05-02 DIAGNOSIS — K219 Gastro-esophageal reflux disease without esophagitis: Secondary | ICD-10-CM | POA: Diagnosis not present

## 2024-05-02 DIAGNOSIS — K2970 Gastritis, unspecified, without bleeding: Secondary | ICD-10-CM | POA: Diagnosis not present

## 2024-05-02 DIAGNOSIS — Z1152 Encounter for screening for COVID-19: Secondary | ICD-10-CM | POA: Diagnosis not present

## 2024-05-02 DIAGNOSIS — R293 Abnormal posture: Secondary | ICD-10-CM | POA: Diagnosis not present

## 2024-05-02 DIAGNOSIS — M6281 Muscle weakness (generalized): Secondary | ICD-10-CM | POA: Diagnosis not present

## 2024-05-02 DIAGNOSIS — F79 Unspecified intellectual disabilities: Secondary | ICD-10-CM | POA: Diagnosis present

## 2024-05-02 DIAGNOSIS — G928 Other toxic encephalopathy: Secondary | ICD-10-CM | POA: Diagnosis not present

## 2024-05-02 DIAGNOSIS — R4182 Altered mental status, unspecified: Secondary | ICD-10-CM | POA: Diagnosis present

## 2024-05-02 DIAGNOSIS — Z808 Family history of malignant neoplasm of other organs or systems: Secondary | ICD-10-CM

## 2024-05-02 DIAGNOSIS — R400 Somnolence: Secondary | ICD-10-CM | POA: Diagnosis not present

## 2024-05-02 DIAGNOSIS — R2681 Unsteadiness on feet: Secondary | ICD-10-CM | POA: Diagnosis not present

## 2024-05-02 DIAGNOSIS — R531 Weakness: Secondary | ICD-10-CM | POA: Diagnosis not present

## 2024-05-02 DIAGNOSIS — G40909 Epilepsy, unspecified, not intractable, without status epilepticus: Secondary | ICD-10-CM | POA: Diagnosis present

## 2024-05-02 DIAGNOSIS — R110 Nausea: Secondary | ICD-10-CM | POA: Diagnosis not present

## 2024-05-02 DIAGNOSIS — R569 Unspecified convulsions: Secondary | ICD-10-CM | POA: Diagnosis not present

## 2024-05-02 DIAGNOSIS — E43 Unspecified severe protein-calorie malnutrition: Secondary | ICD-10-CM | POA: Diagnosis not present

## 2024-05-02 DIAGNOSIS — R1311 Dysphagia, oral phase: Secondary | ICD-10-CM | POA: Diagnosis not present

## 2024-05-02 DIAGNOSIS — R300 Dysuria: Secondary | ICD-10-CM | POA: Diagnosis not present

## 2024-05-02 DIAGNOSIS — R188 Other ascites: Secondary | ICD-10-CM | POA: Diagnosis not present

## 2024-05-02 DIAGNOSIS — E119 Type 2 diabetes mellitus without complications: Secondary | ICD-10-CM | POA: Diagnosis not present

## 2024-05-02 DIAGNOSIS — J9811 Atelectasis: Secondary | ICD-10-CM | POA: Diagnosis not present

## 2024-05-02 DIAGNOSIS — R109 Unspecified abdominal pain: Secondary | ICD-10-CM | POA: Diagnosis not present

## 2024-05-02 DIAGNOSIS — R296 Repeated falls: Secondary | ICD-10-CM | POA: Diagnosis not present

## 2024-05-02 DIAGNOSIS — Z743 Need for continuous supervision: Secondary | ICD-10-CM | POA: Diagnosis not present

## 2024-05-02 DIAGNOSIS — Z7401 Bed confinement status: Secondary | ICD-10-CM | POA: Diagnosis not present

## 2024-05-02 DIAGNOSIS — Z741 Need for assistance with personal care: Secondary | ICD-10-CM | POA: Diagnosis not present

## 2024-05-02 DIAGNOSIS — R9389 Abnormal findings on diagnostic imaging of other specified body structures: Secondary | ICD-10-CM | POA: Diagnosis not present

## 2024-05-02 DIAGNOSIS — R0602 Shortness of breath: Secondary | ICD-10-CM | POA: Diagnosis not present

## 2024-05-02 LAB — COMPREHENSIVE METABOLIC PANEL WITH GFR
ALT: 24 U/L (ref 0–44)
AST: 41 U/L (ref 15–41)
Albumin: 4 g/dL (ref 3.5–5.0)
Alkaline Phosphatase: 185 U/L — ABNORMAL HIGH (ref 38–126)
Anion gap: 16 — ABNORMAL HIGH (ref 5–15)
BUN: 10 mg/dL (ref 6–20)
CO2: 23 mmol/L (ref 22–32)
Calcium: 9.6 mg/dL (ref 8.9–10.3)
Chloride: 100 mmol/L (ref 98–111)
Creatinine, Ser: 0.55 mg/dL (ref 0.44–1.00)
GFR, Estimated: >60 mL/min (ref >60)
Glucose, Bld: 109 mg/dL — ABNORMAL HIGH (ref 70–99)
Potassium: 3.7 mmol/L (ref 3.5–5.1)
Sodium: 140 mmol/L (ref 135–145)
Total Bilirubin: 0.4 mg/dL (ref 0.0–1.2)
Total Protein: 8.3 g/dL — ABNORMAL HIGH (ref 6.5–8.1)

## 2024-05-02 LAB — RESP PANEL BY RT-PCR (RSV, FLU A&B, COVID)  RVPGX2
Influenza A by PCR: NEGATIVE
Influenza B by PCR: NEGATIVE
Resp Syncytial Virus by PCR: NEGATIVE
SARS Coronavirus 2 by RT PCR: NEGATIVE

## 2024-05-02 LAB — URINALYSIS, W/ REFLEX TO CULTURE (INFECTION SUSPECTED)
Bilirubin Urine: NEGATIVE
Glucose, UA: NEGATIVE mg/dL
Hgb urine dipstick: NEGATIVE
Ketones, ur: 5 mg/dL — AB
Leukocytes,Ua: NEGATIVE
Nitrite: NEGATIVE
Protein, ur: 30 mg/dL — AB
Specific Gravity, Urine: 1.025 (ref 1.005–1.030)
pH: 5 (ref 5.0–8.0)

## 2024-05-02 LAB — CBC WITH DIFFERENTIAL/PLATELET
Abs Immature Granulocytes: 0.02 K/uL (ref 0.00–0.07)
Basophils Absolute: 0 K/uL (ref 0.0–0.1)
Basophils Relative: 0 %
Eosinophils Absolute: 0 K/uL (ref 0.0–0.5)
Eosinophils Relative: 1 %
HCT: 35.3 % — ABNORMAL LOW (ref 36.0–46.0)
Hemoglobin: 10.5 g/dL — ABNORMAL LOW (ref 12.0–15.0)
Immature Granulocytes: 0 %
Lymphocytes Relative: 12 %
Lymphs Abs: 1 K/uL (ref 0.7–4.0)
MCH: 24.7 pg — ABNORMAL LOW (ref 26.0–34.0)
MCHC: 29.7 g/dL — ABNORMAL LOW (ref 30.0–36.0)
MCV: 83.1 fL (ref 80.0–100.0)
Monocytes Absolute: 0.5 K/uL (ref 0.1–1.0)
Monocytes Relative: 6 %
Neutro Abs: 6.8 K/uL (ref 1.7–7.7)
Neutrophils Relative %: 81 %
Platelets: 346 K/uL (ref 150–400)
RBC: 4.25 MIL/uL (ref 3.87–5.11)
RDW: 16.4 % — ABNORMAL HIGH (ref 11.5–15.5)
WBC: 8.3 K/uL (ref 4.0–10.5)
nRBC: 0 % (ref 0.0–0.2)

## 2024-05-02 LAB — LIPASE, BLOOD: Lipase: 30 U/L (ref 11–51)

## 2024-05-02 LAB — PHENYTOIN LEVEL, TOTAL: Phenytoin Lvl: 34.7 ug/mL (ref 10.0–20.0)

## 2024-05-02 MED ORDER — ENOXAPARIN SODIUM 40 MG/0.4ML IJ SOSY
40.0000 mg | PREFILLED_SYRINGE | INTRAMUSCULAR | Status: DC
  Administered 2024-05-02 – 2024-05-06 (×5): 40 mg via SUBCUTANEOUS
  Filled 2024-05-02 (×5): qty 0.4

## 2024-05-02 MED ORDER — SODIUM CHLORIDE 0.9 % IV SOLN: Freq: Once | INTRAVENOUS | Status: AC

## 2024-05-02 MED ORDER — LORAZEPAM 2 MG/ML IJ SOLN
1.0000 mg | INTRAMUSCULAR | Status: AC
Start: 2024-05-02 — End: 2024-05-02
  Administered 2024-05-02: 1 mg via INTRAVENOUS
  Filled 2024-05-02: qty 1

## 2024-05-02 MED ORDER — IOHEXOL 300 MG/ML  SOLN
100.0000 mL | Freq: Once | INTRAMUSCULAR | Status: AC | PRN
  Administered 2024-05-02: 100 mL via INTRAVENOUS

## 2024-05-02 MED ORDER — ACETAMINOPHEN 325 MG PO TABS
650.0000 mg | ORAL_TABLET | Freq: Four times a day (QID) | ORAL | Status: DC | PRN
  Administered 2024-05-03 – 2024-05-06 (×3): 650 mg via ORAL
  Filled 2024-05-02 (×3): qty 2

## 2024-05-02 MED ORDER — LACTATED RINGERS IV SOLN: INTRAVENOUS | Status: AC

## 2024-05-02 MED ORDER — SODIUM CHLORIDE 0.9 % IV BOLUS
500.0000 mL | Freq: Once | INTRAVENOUS | Status: AC
  Administered 2024-05-02: 500 mL via INTRAVENOUS

## 2024-05-02 MED ORDER — MELATONIN 5 MG PO TABS
5.0000 mg | ORAL_TABLET | Freq: Every evening | ORAL | Status: DC | PRN
  Administered 2024-05-03 – 2024-05-05 (×3): 5 mg via ORAL
  Filled 2024-05-02 (×3): qty 1

## 2024-05-02 MED ORDER — POLYETHYLENE GLYCOL 3350 17 G PO PACK
17.0000 g | PACK | Freq: Every day | ORAL | Status: DC | PRN
  Administered 2024-05-06: 17 g via ORAL
  Filled 2024-05-02: qty 1

## 2024-05-02 MED ORDER — PROCHLORPERAZINE EDISYLATE 10 MG/2ML IJ SOLN: 5.0000 mg | Freq: Four times a day (QID) | INTRAMUSCULAR | Status: DC | PRN

## 2024-05-02 NOTE — ED Notes (Signed)
[facility] called 

## 2024-05-02 NOTE — ED Notes (Signed)
 Bear hugger on due to low temp. RN aware

## 2024-05-02 NOTE — ED Provider Notes (Signed)
 [place] EMERGENCY DEPARTMENT AT [facility] Provider Note   CSN: [id number] Arrival date & time: 05/02/24  1533     Patient presents with: Abdominal Pain, Nausea, and Headache   Patricia White is a 53 y.o. female.   53 year old female with prior medical history as detailed below presents for evaluation. Patient arrives from [facility].    Patient with complaint of abdominal pain, nausea, headache, decreased p.o. intake.  History of CP and developmental delay.  Patient is at baseline mental status per EMS.  History limited by patient's baseline mental status.  However patient's facility feels that her mental status has decreased over the last 2 days after starting Seroquel.  The history is provided by the patient and medical records.       Prior to Admission medications   Medication Sig Start Date End Date Taking? Authorizing Provider  acetaminophen  (TYLENOL ) 325 MG tablet Take 2 tablets (650 mg total) by mouth every 6 (six) hours as needed for mild pain (pain score 1-3) or fever. 02/20/24   Alba Sharper, MD  clonazePAM  (KLONOPIN ) 0.5 MG tablet Take 1 tablet (0.5 mg total) by mouth 3 (three) times daily. 02/20/24   Alba Sharper, MD  indapamide  (LOZOL ) 1.25 MG tablet Take 1.25 mg by mouth daily.    [provider]  lacosamide  (VIMPAT ) 200 MG TABS tablet Take 1 tablet (200 mg total) by mouth 2 (two) times daily. 02/20/24   Alba Sharper, MD  levETIRAcetam  (KEPPRA ) 750 MG tablet Take 2 tablets (1,500 mg total) by mouth 2 (two) times daily. Patient not taking: Reported on 02/06/2024 11/26/23   Alba Sharper, MD  losartan  (COZAAR ) 100 MG tablet Take 50 mg by mouth at bedtime. 01/05/24   [provider]  pantoprazole  (PROTONIX ) 40 MG tablet Take 1 tablet (40 mg total) by mouth daily. 02/20/24   Alba Sharper, MD  Perampanel  4 MG TABS Take 1 tablet (4 mg total) by mouth daily. Patient not taking: Reported on 02/06/2024 11/26/23   Alba Sharper, MD  phenytoin  (DILANTIN ) 50 MG tablet Chew 2 tablets (100 mg total) by mouth daily AND 3 tablets (150 mg total) at bedtime. 02/20/24   Quillen, Michael, MD  polyethylene glycol (MIRALAX  / GLYCOLAX ) 17 g packet Take 17 g by mouth daily. 02/20/24   Alba Sharper, MD  rosuvastatin  (CRESTOR ) 20 MG tablet Take 1 tablet (20 mg total) by mouth daily. 02/20/24   Alba Sharper, MD  senna-docusate (SENOKOT-S) 8.6-50 MG tablet Take 1 tablet by mouth daily. 02/20/24   Quillen, Michael, MD    Allergies: Patient has no known allergies.    Review of Systems  Unable to perform ROS: Other    Updated Vital Signs BP 122/82 (BP Location: Right Arm)   Pulse 85   Temp 97.6 F (36.4 C) (Oral)   Resp 17   Ht 5' 5 (1.651 m)   Wt 71 kg   SpO2 100%   BMI 26.05 kg/m   Physical Exam Vitals and nursing note reviewed.  Constitutional:      General: She is not in acute distress.    Appearance: Normal appearance. She is well-developed.  HENT:     Head: Normocephalic and atraumatic.  Eyes:     Conjunctiva/sclera: Conjunctivae normal.     Pupils: Pupils are equal, round, and reactive to light.  Cardiovascular:     Rate and Rhythm: Normal rate and regular rhythm.     Heart sounds: Normal heart sounds.  Pulmonary:  Effort: Pulmonary effort is normal. No respiratory distress.     Breath sounds: Normal breath sounds.  Abdominal:     General: Abdomen is flat. There is no distension.     Palpations: Abdomen is soft.     Tenderness: There is no abdominal tenderness.  Musculoskeletal:        General: No deformity. Normal range of motion.     Cervical back: Normal range of motion and neck supple.  Skin:    General: Skin is warm and dry.  Neurological:     General: No focal deficit present.     Mental Status: She is alert and oriented to person, place, and time.     (all labs ordered are listed, but only abnormal results are displayed) Labs Reviewed  RESP PANEL BY RT-PCR (RSV, FLU A&B, COVID)   [id number]  URINALYSIS, W/ REFLEX TO CULTURE (INFECTION SUSPECTED)  COMPREHENSIVE METABOLIC PANEL WITH GFR  CBC WITH DIFFERENTIAL/PLATELET  LIPASE, BLOOD  VALPROIC  ACID LEVEL    EKG: None  Radiology: No results found.   Procedures   Medications Ordered in the ED  sodium chloride  0.9 % bolus 500 mL (has no administration in time range)                                    Medical Decision Making Patient sent from her facility for evaluation of altered mental status with decreased level of alertness.  Patient with history of CP and developmental delay.  Patient complains primarily of nausea, diffuse abdominal discomfort.  Staff at her facility thought that her mental status change could be associated with recent initiation of Seroquel 2 days ago. They report increased lethargy.  However, patient's phenytoin  levels are elevated at 35.  I suspect phenytoin  toxicity as cause of AMS/lethargy/nausea.  Other screening labs and imaging are without evidence of acute abnormality.  Patient would benefit from admission.  Hospitalist service contacted regarding case and need for admission.  Amount and/or Complexity of Data Reviewed Labs: ordered. Radiology: ordered.  Risk Prescription drug management. Decision regarding hospitalization.        Final diagnoses:  Altered mental status, unspecified altered mental status type    ED Discharge Orders     None          Patricia Maude BROCKS, MD 05/02/24 [phone]

## 2024-05-02 NOTE — ED Triage Notes (Signed)
 Pt BIBA from [facility], c/o abdominal pain, nausea, headache, decrease in oral intake, painful urination.  Per staff has had increase of falls in past 2 weeks. Has started quetiapine 2 days ago, has been harder to arouse since.  Hx of cerebral palsy. Mentation at baseline. Ambulatory with 1 person assist. VSS.   CBG 157

## 2024-05-02 NOTE — H&P (Incomplete)
 History and Physical  Patricia White FMW:[id number] DOB: Aug 21, 1970 DOA: 05/02/2024  Referring physician: Dr. Laurice, EDP  PCP: Alba Sharper, MD  Outpatient Specialists: Neurology, [facility]. Patient coming from: SNF.  Chief Complaint: Altered mental status.  HPI: Patricia White is a 53 y.o. female with medical history significant for cerebral palsy, seizure disorder, hypertension, hyperlipidemia, GERD, gastritis, who presents to the ER from SNF due to altered mental status.  Reportedly had abdominal pain, nausea, and decrease in oral intake at the facility.  Patient was started on Seroquel 2 days ago.  The patient is unable to provide a history.  Noted to have decreased level of alertness at her facility.  EMS was activated.  In the ER,      ED Course: ***  Review of Systems: Review of systems as noted in the HPI. All other systems reviewed and are negative.   Past Medical History:  Diagnosis Date  . Asthma   . Cerebral palsy (HCC) Birth  . Mental retardation Birth  . 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 Seizures (HCC)    Past Surgical History:  Procedure Laterality Date  . ESOPHAGOGASTRODUODENOSCOPY N/A 11/08/2023   Procedure: EGD (ESOPHAGOGASTRODUODENOSCOPY);  Surgeon: San Sandor GAILS, DO;  Location: [facility] ENDOSCOPY;  Service: Gastroenterology;  Laterality: N/A;  . POLYPECTOMY  11/08/2023   Procedure: POLYPECTOMY;  Surgeon: San Sandor GAILS, DO;  Location: [facility] ENDOSCOPY;  Service: Gastroenterology;;    Social History:  reports that she has never smoked. She has never used smokeless tobacco. She reports that she does not drink alcohol and does not use drugs.   No Known Allergies  Family History  Problem Relation Age of Onset  . Migraines Mother   . Cancer Mother        liver cancer  . Diabetes Maternal Aunt   . Hypertension Maternal Grandmother   . Colon cancer Maternal Grandfather 60  . Prostate cancer Maternal Grandfather   . Throat cancer Maternal  Grandfather     ***  Prior to Admission medications   Medication Sig Start Date End Date Taking? Authorizing Provider  acetaminophen  (TYLENOL ) 325 MG tablet Take 2 tablets (650 mg total) by mouth every 6 (six) hours as needed for mild pain (pain score 1-3) or fever. 02/20/24   Alba Sharper, MD  clonazePAM  (KLONOPIN ) 0.5 MG tablet Take 1 tablet (0.5 mg total) by mouth 3 (three) times daily. 02/20/24   Alba Sharper, MD  indapamide  (LOZOL ) 1.25 MG tablet Take 1.25 mg by mouth daily.    [provider]  lacosamide  (VIMPAT ) 200 MG TABS tablet Take 1 tablet (200 mg total) by mouth 2 (two) times daily. 02/20/24   Alba Sharper, MD  levETIRAcetam  (KEPPRA ) 750 MG tablet Take 2 tablets (1,500 mg total) by mouth 2 (two) times daily. Patient not taking: Reported on 02/06/2024 11/26/23   Alba Sharper, MD  losartan  (COZAAR ) 100 MG tablet Take 50 mg by mouth at bedtime. 01/05/24   [provider]  pantoprazole  (PROTONIX ) 40 MG tablet Take 1 tablet (40 mg total) by mouth daily. 02/20/24   Alba Sharper, MD  Perampanel  4 MG TABS Take 1 tablet (4 mg total) by mouth daily. Patient not taking: Reported on 02/06/2024 11/26/23   Alba Sharper, MD  phenytoin  (DILANTIN ) 50 MG tablet Chew 2 tablets (100 mg total) by mouth daily AND 3 tablets (150 mg total) at bedtime. 02/20/24   Quillen, Michael, MD  polyethylene glycol (MIRALAX  / GLYCOLAX ) 17 g packet Take 17 g by mouth daily.  02/20/24   Alba Sharper, MD  rosuvastatin  (CRESTOR ) 20 MG tablet Take 1 tablet (20 mg total) by mouth daily. 02/20/24   Alba Sharper, MD  senna-docusate (SENOKOT-S) 8.6-50 MG tablet Take 1 tablet by mouth daily. 02/20/24   Alba Sharper, MD    Physical Exam: BP 124/83   Pulse 87   Temp 97.6 F (36.4 C) (Oral)   Resp 15   Ht 5' 5 (1.651 m)   Wt 71 kg   SpO2 100%   BMI 26.05 kg/m   General: 53 y.o. year-old female well developed well nourished in no acute distress.  Alert and oriented x3. Cardiovascular:  Regular rate and rhythm with no rubs or gallops.  No thyromegaly or JVD noted.  No lower extremity edema. 2/4 pulses in all 4 extremities. Respiratory: Clear to auscultation with no wheezes or rales. Good inspiratory effort. Abdomen: Soft nontender nondistended with normal bowel sounds x4 quadrants. Muskuloskeletal: No cyanosis, clubbing or edema noted bilaterally Neuro: CN II-XII intact, strength, sensation, reflexes Skin: No ulcerative lesions noted or rashes Psychiatry: Judgement and insight appear normal. Mood is appropriate for condition and setting          Labs on Admission:  Basic Metabolic Panel: Recent Labs  Lab 05/02/24 1646  NA 140  K 3.7  CL 100  CO2 23  GLUCOSE 109*  BUN 10  CREATININE 0.55  CALCIUM  9.6   Liver Function Tests: Recent Labs  Lab 05/02/24 1646  AST 41  ALT 24  ALKPHOS 185*  BILITOT 0.4  PROT 8.3*  ALBUMIN 4.0   Recent Labs  Lab 05/02/24 1646  LIPASE 30   No results for input(s): AMMONIA in the last 168 hours. CBC: Recent Labs  Lab 05/02/24 1646  WBC 8.3  NEUTROABS 6.8  HGB 10.5*  HCT 35.3*  MCV 83.1  PLT 346   Cardiac Enzymes: No results for input(s): CKTOTAL, CKMB, CKMBINDEX, TROPONINI in the last 168 hours.  BNP (last 3 results) Recent Labs    12/30/23 0001  BNP 16.0    ProBNP (last 3 results) No results for input(s): PROBNP in the last 8760 hours.  CBG: No results for input(s): GLUCAP in the last 168 hours.  Radiological Exams on Admission: CT ABDOMEN PELVIS W CONTRAST Result Date: 05/02/2024 EXAM: CT ABDOMEN AND PELVIS WITH CONTRAST 05/02/2024 07:19:16 PM TECHNIQUE: CT of the abdomen and pelvis was performed with the administration of intravenous contrast. Multiplanar reformatted images are provided for review. Automated exposure control, iterative reconstruction, and/or weight-based adjustment of the mA/kV was utilized to reduce the radiation dose to as low as reasonably achievable. COMPARISON:  11/03/2023 CLINICAL HISTORY: Abdominal pain, acute, nonlocalized. Per chart Pt BIBA from [facility], c/o abdominal pain, nausea, headache, decrease in oral intake, painful urination. Per staff has had increase of falls in past 2 weeks. Has started quetiapine 2 days ago, has been harder to arouse since. Hx of cerebral palsy. Mentation at baseline. Ambulatory with 1 person assist. VSS. FINDINGS: LOWER CHEST: Mild patchy bilateral lower lobe opacities, likely atelectasis. LIVER: The liver is unremarkable. GALLBLADDER AND BILE DUCTS: Gallbladder is unremarkable. No biliary ductal dilatation. SPLEEN: No acute abnormality. PANCREAS: No acute abnormality. ADRENAL GLANDS: No acute abnormality. KIDNEYS, URETERS AND BLADDER: No stones in the kidneys or ureters. No hydronephrosis. No perinephric or periureteral stranding. Urinary bladder is unremarkable. GI AND BOWEL: Stomach demonstrates no acute abnormality. There is no bowel obstruction. Appendix is not discretely visualized. PERITONEUM AND RETROPERITONEUM: Mild presacral fluid/stranding (image 73), nonspecific. No  free air. VASCULATURE: Aorta is normal in caliber. LYMPH NODES: No lymphadenopathy. REPRODUCTIVE ORGANS: Uterine fibroids. BONES AND SOFT TISSUES: No acute osseous abnormality. No focal soft tissue abnormality. LIMITATIONS/ARTIFACTS: Motion degraded images. IMPRESSION: 1. No acute findings in the abdomen or pelvis. 2. Uterine fibroids. Electronically signed by: Pinkie Pebbles MD 05/02/2024 07:27 PM EDT RP Workstation: [id number]   CT Head Wo Contrast Result Date: 05/02/2024 EXAM: CT HEAD WITHOUT CONTRAST 05/02/2024 07:19:16 PM TECHNIQUE: CT of the head was performed without the administration of intravenous contrast. Automated exposure control, iterative reconstruction, and/or weight based adjustment of the mA/kV was utilized to reduce the radiation dose to as low as reasonably achievable. COMPARISON: 02/05/2024 CLINICAL HISTORY: Mental status change,  unknown cause. Per chart Pt BIBA from [facility], c/o abdominal pain, nausea, headache, decrease in oral intake, painful urination. Per staff has had increase of falls in past 2 weeks. Has started quetiapine 2 days ago, has been harder to arouse since. Hx of cerebral palsy. Mentation at baseline. Ambulatory with 1 person assist. VSS. FINDINGS: BRAIN AND VENTRICLES: No acute hemorrhage. No evidence of acute infarct. No hydrocephalus. No extra-axial collection. No mass effect or midline shift. ORBITS: No acute abnormality. SINUSES: No acute abnormality. SOFT TISSUES AND SKULL: No acute soft tissue abnormality. No skull fracture. IMPRESSION: 1. No acute intracranial abnormality. Electronically signed by: Pinkie Pebbles MD 05/02/2024 07:24 PM EDT RP Workstation: [id number]   DG Chest Port 1 View Result Date: 05/02/2024 CLINICAL DATA:  Shortness of breath EXAM: PORTABLE CHEST 1 VIEW COMPARISON:  02/05/2024 FINDINGS: Heart and mediastinal contours are within normal limits. No focal opacities or effusions. No acute bony abnormality. IMPRESSION: No active disease. Electronically Signed   By: Franky Crease M.D.   On: 05/02/2024 17:03    EKG: I independently viewed the EKG done and my findings are as followed: ***   Assessment/Plan Present on Admission: . AMS (altered mental status)  Principal Problem:   AMS (altered mental status)   DVT prophylaxis: ***   Code Status: ***   Family Communication: ***   Disposition Plan: ***   Consults called: ***   Admission status: ***    Status is: Observation {Observation:23811}   Terry LOISE Hurst MD [facility] Pager [phone]  If 7PM-7AM, please contact night-coverage [email or web address] Password [email or web address]  05/02/2024, 7:58 PM

## 2024-05-02 NOTE — H&P (Addendum)
 History and Physical  [name] FMW:[id number] DOB: Apr 04, 1971 DOA: 05/02/2024  Referring physician: Dr. Laurice, EDP  PCP: Alba Sharper, MD  Outpatient Specialists: Neurology, [facility]. Patient coming from: SNF.  Chief Complaint: Altered mental status.  HPI: Patricia White is a 53 y.o. female with medical history significant for cerebral palsy, seizure disorder, hypertension, hyperlipidemia, GERD, gastritis, who presents to the ER from SNF due to altered mental status.  Reportedly had abdominal pain, nausea, and decrease in oral intake at the facility.  Patient was started on Seroquel 2 days ago.  The patient is unable to provide a history.  Noted to have decreased level of alertness at her facility.  EMS was activated.  In the ER, hypothermic, with temperature 93.6, Bair hugger applied.  Phenytoin  level greater than 34.  UA negative for pyuria.  Chest x-ray nonacute.  No acute findings on noncontrast CT head, abdomen and pelvis with contrast.  Received IV fluid in the ER.  Admitted for altered mental status with concern for moderate phenytoin  toxicity.  ED Course: Temperature 94.  BP 125/86, pulse 86, respiratory 15, O2 saturation 94% on room air.  Review of Systems: Review of systems as noted in the HPI. All other systems reviewed and are negative.   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 She has never used smokeless  tobacco. She reports that she does not drink alcohol and does not use drugs.   No Known Allergies  Family History  Problem Relation Age of Onset   Migraines Mother    Cancer Mother        liver cancer   Diabetes Maternal Aunt    Hypertension Maternal Grandmother    Colon cancer Maternal Grandfather 60   Prostate cancer Maternal Grandfather    Throat cancer Maternal Grandfather       Prior to Admission medications   Medication Sig Start Date End Date Taking? Authorizing Provider  acetaminophen  (TYLENOL ) 325 MG tablet Take 2 tablets (650 mg total) by mouth every 6 (six) hours as needed for mild pain (pain score 1-3) or fever. 02/20/24   Alba Sharper, MD  clonazePAM  (KLONOPIN ) 0.5 MG tablet Take 1 tablet (0.5 mg total) by mouth 3 (three) times daily. 02/20/24   Alba Sharper, MD  indapamide  (LOZOL ) 1.25 MG tablet Take 1.25 mg by mouth daily.    [provider]  lacosamide  (VIMPAT ) 200 MG TABS tablet Take 1 tablet (200 mg total) by mouth 2 (two) times daily. 02/20/24   Alba Sharper, MD  levETIRAcetam  (KEPPRA ) 750 MG tablet Take 2 tablets (1,500 mg total) by mouth 2 (two) times daily. Patient not taking: Reported on 02/06/2024 11/26/23   Alba Sharper, MD  losartan  (COZAAR ) 100 MG tablet Take 50 mg by mouth at bedtime. 01/05/24   [provider]  pantoprazole  (PROTONIX ) 40 MG tablet Take 1 tablet (40 mg total) by mouth daily. 02/20/24   Alba Sharper, MD  Perampanel  4 MG TABS Take  1 tablet (4 mg total) by mouth daily. Patient not taking: Reported on 02/06/2024 11/26/23   Alba Sharper, MD  phenytoin  (DILANTIN ) 50 MG tablet Chew 2 tablets (100 mg total) by mouth daily AND 3 tablets (150 mg total) at bedtime. 02/20/24   Quillen, Michael, MD  polyethylene glycol (MIRALAX  / GLYCOLAX ) 17 g packet Take 17 g by mouth daily. 02/20/24   Alba Sharper, MD  rosuvastatin  (CRESTOR ) 20 MG tablet Take 1 tablet (20 mg total) by mouth daily. 02/20/24   Alba Sharper, MD   senna-docusate (SENOKOT-S) 8.6-50 MG tablet Take 1 tablet by mouth daily. 02/20/24   Alba Sharper, MD    Physical Exam: BP 133/82 (BP Location: Right Arm)   Pulse 86   Temp (!) 94 F (34.4 C) (Rectal)   Resp 15   Ht 5' 5 (1.651 m)   Wt 71 kg   SpO2 94%   BMI 26.05 kg/m   General: 53 y.o. year-old female well developed well nourished in no acute distress.  Alert and altered. Cardiovascular: Regular rate and rhythm with no rubs or gallops.  No thyromegaly or JVD noted.  No lower extremity edema.  Respiratory: Clear to auscultation with no wheezes or rales.  Poor inspiratory effort. Abdomen: Soft nontender nondistended with normal bowel sounds x4 quadrants. Muskuloskeletal: No cyanosis or clubbing noted bilaterally Neuro: CN II-XII intact, strength, sensation, reflexes Skin: No ulcerative lesions noted or rashes Psychiatry: Judgement and insight appear altered.  Unable to assess mood due to altered mental status.         Labs on Admission:  Basic Metabolic Panel: Recent Labs  Lab 05/02/24 1646  NA 140  K 3.7  CL 100  CO2 23  GLUCOSE 109*  BUN 10  CREATININE 0.55  CALCIUM  9.6   Liver Function Tests: Recent Labs  Lab 05/02/24 1646  AST 41  ALT 24  ALKPHOS 185*  BILITOT 0.4  PROT 8.3*  ALBUMIN 4.0   Recent Labs  Lab 05/02/24 1646  LIPASE 30   No results for input(s): AMMONIA in the last 168 hours. CBC: Recent Labs  Lab 05/02/24 1646  WBC 8.3  NEUTROABS 6.8  HGB 10.5*  HCT 35.3*  MCV 83.1  PLT 346   Cardiac Enzymes: No results for input(s): CKTOTAL, CKMB, CKMBINDEX, TROPONINI in the last 168 hours.  BNP (last 3 results) Recent Labs    12/30/23 0001  BNP 16.0    ProBNP (last 3 results) No results for input(s): PROBNP in the last 8760 hours.  CBG: No results for input(s): GLUCAP in the last 168 hours.  Radiological Exams on Admission: CT ABDOMEN PELVIS W CONTRAST Result Date: 05/02/2024 EXAM: CT ABDOMEN AND PELVIS WITH  CONTRAST 05/02/2024 07:19:16 PM TECHNIQUE: CT of the abdomen and pelvis was performed with the administration of intravenous contrast. Multiplanar reformatted images are provided for review. Automated exposure control, iterative reconstruction, and/or weight-based adjustment of the mA/kV was utilized to reduce the radiation dose to as low as reasonably achievable. COMPARISON: 11/03/2023 CLINICAL HISTORY: Abdominal pain, acute, nonlocalized. Per chart Pt BIBA from [facility], c/o abdominal pain, nausea, headache, decrease in oral intake, painful urination. Per staff has had increase of falls in past 2 weeks. Has started quetiapine 2 days ago, has been harder to arouse since. Hx of cerebral palsy. Mentation at baseline. Ambulatory with 1 person assist. VSS. FINDINGS: LOWER CHEST: Mild patchy bilateral lower lobe opacities, likely atelectasis. LIVER: The liver is unremarkable. GALLBLADDER AND BILE DUCTS: Gallbladder is unremarkable. No biliary  ductal dilatation. SPLEEN: No acute abnormality. PANCREAS: No acute abnormality. ADRENAL GLANDS: No acute abnormality. KIDNEYS, URETERS AND BLADDER: No stones in the kidneys or ureters. No hydronephrosis. No perinephric or periureteral stranding. Urinary bladder is unremarkable. GI AND BOWEL: Stomach demonstrates no acute abnormality. There is no bowel obstruction. Appendix is not discretely visualized. PERITONEUM AND RETROPERITONEUM: Mild presacral fluid/stranding (image 73), nonspecific. No free air. VASCULATURE: Aorta is normal in caliber. LYMPH NODES: No lymphadenopathy. REPRODUCTIVE ORGANS: Uterine fibroids. BONES AND SOFT TISSUES: No acute osseous abnormality. No focal soft tissue abnormality. LIMITATIONS/ARTIFACTS: Motion degraded images. IMPRESSION: 1. No acute findings in the abdomen or pelvis. 2. Uterine fibroids. Electronically signed by: Pinkie Pebbles MD 05/02/2024 07:27 PM EDT RP Workstation: [id number]   CT Head Wo Contrast Result Date: 05/02/2024 EXAM: CT  HEAD WITHOUT CONTRAST 05/02/2024 07:19:16 PM TECHNIQUE: CT of the head was performed without the administration of intravenous contrast. Automated exposure control, iterative reconstruction, and/or weight based adjustment of the mA/kV was utilized to reduce the radiation dose to as low as reasonably achievable. COMPARISON: 02/05/2024 CLINICAL HISTORY: Mental status change, unknown cause. Per chart Pt BIBA from [facility], c/o abdominal pain, nausea, headache, decrease in oral intake, painful urination. Per staff has had increase of falls in past 2 weeks. Has started quetiapine 2 days ago, has been harder to arouse since. Hx of cerebral palsy. Mentation at baseline. Ambulatory with 1 person assist. VSS. FINDINGS: BRAIN AND VENTRICLES: No acute hemorrhage. No evidence of acute infarct. No hydrocephalus. No extra-axial collection. No mass effect or midline shift. ORBITS: No acute abnormality. SINUSES: No acute abnormality. SOFT TISSUES AND SKULL: No acute soft tissue abnormality. No skull fracture. IMPRESSION: 1. No acute intracranial abnormality. Electronically signed by: Pinkie Pebbles MD 05/02/2024 07:24 PM EDT RP Workstation: [id number]   DG Chest Port 1 View Result Date: 05/02/2024 CLINICAL DATA:  Shortness of breath EXAM: PORTABLE CHEST 1 VIEW COMPARISON:  02/05/2024 FINDINGS: Heart and mediastinal contours are within normal limits. No focal opacities or effusions. No acute bony abnormality. IMPRESSION: No active disease. Electronically Signed   By: Franky Crease M.D.   On: 05/02/2024 17:03    EKG: I independently viewed the EKG done and my findings are as followed: Sinus rhythm rate of 84.  Nonspecific ST-T changes.  QTc 509.  Assessment/Plan Present on Admission:  AMS (altered mental status)  Principal Problem:   AMS (altered mental status)  Acute toxic metabolic encephalopathy in the setting of phenytoin  toxicity Phenytoin  level greater than 34 Hold off phenytoin , closely  monitor Reorient as needed, fall precautions.  Seizure disorder Continue to hold off home phenytoin  Seizure precautions and fall precautions IV Ativan  as needed for breakthrough seizures.  Hypothermia Temperature 93.6, suspect contributed by phenytoin  toxicity Continue Bair hugger No leukocytosis or tachycardia.  No bandemia on CBC with differentials. Imagings are non acute UA is negative for pyuria Continue to closely monitor vital signs.  Elevated alkaline phosphatase Nonspecific Repeat CMP in the morning  Chronic normocytic anemia At baseline, monitor  QTc prolongation Admission twelve-lead EKG with QTc of 509 Avoid QTc prolonging agents Optimize magnesium  and potassium levels Monitor on telemetry.   Critical care time: 55 minutes.   DVT prophylaxis: Subcu Lovenox  daily.  Code Status: Full code.  Family Communication: None at bedside.  Disposition Plan: Admitted to stepdown unit.  Consults called: None.  Admission status: Inpatient status.   Status is: Inpatient status. The patient requires at least 2 midnights for further evaluation and treatment of present condition.  Terry LOISE Hurst MD [facility] Pager [phone]  If 7PM-7AM, please contact night-coverage [email or web address] Password [email or web address]  05/03/2024, 1:19 AM

## 2024-05-03 ENCOUNTER — Inpatient Hospital Stay (HOSPITAL_COMMUNITY)

## 2024-05-03 DIAGNOSIS — R4182 Altered mental status, unspecified: Secondary | ICD-10-CM | POA: Diagnosis not present

## 2024-05-03 DIAGNOSIS — R569 Unspecified convulsions: Secondary | ICD-10-CM

## 2024-05-03 LAB — COMPREHENSIVE METABOLIC PANEL WITH GFR
ALT: 20 U/L (ref 0–44)
AST: 27 U/L (ref 15–41)
Albumin: 3.2 g/dL — ABNORMAL LOW (ref 3.5–5.0)
Alkaline Phosphatase: 137 U/L — ABNORMAL HIGH (ref 38–126)
Anion gap: 11 (ref 5–15)
BUN: 5 mg/dL — ABNORMAL LOW (ref 6–20)
CO2: 24 mmol/L (ref 22–32)
Calcium: 8.8 mg/dL — ABNORMAL LOW (ref 8.9–10.3)
Chloride: 103 mmol/L (ref 98–111)
Creatinine, Ser: 0.91 mg/dL (ref 0.44–1.00)
GFR, Estimated: >60 mL/min (ref >60)
Glucose, Bld: 139 mg/dL — ABNORMAL HIGH (ref 70–99)
Potassium: 3.2 mmol/L — ABNORMAL LOW (ref 3.5–5.1)
Sodium: 138 mmol/L (ref 135–145)
Total Bilirubin: 0.6 mg/dL (ref 0.0–1.2)
Total Protein: 7.7 g/dL (ref 6.5–8.1)

## 2024-05-03 LAB — CBC
HCT: 31.7 % — ABNORMAL LOW (ref 36.0–46.0)
Hemoglobin: 10.1 g/dL — ABNORMAL LOW (ref 12.0–15.0)
MCH: 26.3 pg (ref 26.0–34.0)
MCHC: 31.9 g/dL (ref 30.0–36.0)
MCV: 82.6 fL (ref 80.0–100.0)
Platelets: 294 K/uL (ref 150–400)
RBC: 3.84 MIL/uL — ABNORMAL LOW (ref 3.87–5.11)
RDW: 16.6 % — ABNORMAL HIGH (ref 11.5–15.5)
WBC: 6.3 K/uL (ref 4.0–10.5)
nRBC: 0 % (ref 0.0–0.2)

## 2024-05-03 LAB — MAGNESIUM: Magnesium: 1.5 mg/dL — ABNORMAL LOW (ref 1.7–2.4)

## 2024-05-03 LAB — CORTISOL: Cortisol, Plasma: 13 ug/dL

## 2024-05-03 LAB — LACTIC ACID, PLASMA
Lactic Acid, Venous: 0.9 mmol/L (ref 0.5–1.9)
Lactic Acid, Venous: 1.9 mmol/L (ref 0.5–1.9)

## 2024-05-03 LAB — PHENYTOIN LEVEL, TOTAL: Phenytoin Lvl: 27.5 ug/mL — ABNORMAL HIGH (ref 10.0–20.0)

## 2024-05-03 LAB — T4, FREE: Free T4: 0.74 ng/dL (ref 0.61–1.12)

## 2024-05-03 LAB — TSH: TSH: 8.634 u[IU]/mL — ABNORMAL HIGH (ref 0.350–4.500)

## 2024-05-03 LAB — PHOSPHORUS: Phosphorus: 2.9 mg/dL (ref 2.5–4.6)

## 2024-05-03 MED ORDER — IOHEXOL 350 MG/ML SOLN
75.0000 mL | Freq: Once | INTRAVENOUS | Status: AC | PRN
  Administered 2024-05-03: 75 mL via INTRAVENOUS

## 2024-05-03 MED ORDER — LEVETIRACETAM 500 MG PO TABS
1500.0000 mg | ORAL_TABLET | Freq: Two times a day (BID) | ORAL | Status: DC
  Administered 2024-05-03 – 2024-05-07 (×9): 1500 mg via ORAL
  Filled 2024-05-03 (×9): qty 3

## 2024-05-03 MED ORDER — POTASSIUM CHLORIDE CRYS ER 10 MEQ PO TBCR
40.0000 meq | EXTENDED_RELEASE_TABLET | Freq: Once | ORAL | Status: AC
  Administered 2024-05-03: 40 meq via ORAL
  Filled 2024-05-03: qty 4

## 2024-05-03 MED ORDER — CLONAZEPAM 0.5 MG PO TABS
0.5000 mg | ORAL_TABLET | Freq: Three times a day (TID) | ORAL | Status: DC
  Administered 2024-05-03 – 2024-05-07 (×12): 0.5 mg via ORAL
  Filled 2024-05-03 (×12): qty 1

## 2024-05-03 MED ORDER — POTASSIUM CHLORIDE 10 MEQ/100ML IV SOLN
10.0000 meq | INTRAVENOUS | Status: DC
Start: 2024-05-03 — End: 2024-05-03

## 2024-05-03 MED ORDER — POTASSIUM CHLORIDE CRYS ER 20 MEQ PO TBCR: 40.0000 meq | EXTENDED_RELEASE_TABLET | Freq: Once | ORAL | Status: DC

## 2024-05-03 MED ORDER — LORAZEPAM 2 MG/ML IJ SOLN
2.0000 mg | Freq: Four times a day (QID) | INTRAMUSCULAR | Status: DC | PRN
  Administered 2024-05-03: 2 mg via INTRAVENOUS
  Filled 2024-05-03: qty 1

## 2024-05-03 MED ORDER — LACOSAMIDE 200 MG PO TABS
200.0000 mg | ORAL_TABLET | Freq: Two times a day (BID) | ORAL | Status: DC
Start: 2024-05-03 — End: 2024-05-07
  Administered 2024-05-03 – 2024-05-07 (×9): 200 mg via ORAL
  Filled 2024-05-03 (×9): qty 1

## 2024-05-03 MED ORDER — POTASSIUM CHLORIDE 10 MEQ/100ML IV SOLN
10.0000 meq | INTRAVENOUS | Status: DC
  Administered 2024-05-03: 10 meq via INTRAVENOUS

## 2024-05-03 MED ORDER — POTASSIUM CHLORIDE 10 MEQ/100ML IV SOLN
10.0000 meq | INTRAVENOUS | Status: DC
  Administered 2024-05-03 (×2): 10 meq via INTRAVENOUS
  Filled 2024-05-03 (×3): qty 100

## 2024-05-03 MED ORDER — MIDAZOLAM HCL 2 MG/2ML IJ SOLN
2.5000 mg | Freq: Once | INTRAMUSCULAR | Status: AC
  Administered 2024-05-03: 2.5 mg via INTRAVENOUS
  Filled 2024-05-03: qty 4

## 2024-05-03 MED ORDER — POTASSIUM CHLORIDE 10 MEQ/100ML IV SOLN: 10.0000 meq | INTRAVENOUS | Status: DC

## 2024-05-03 NOTE — ED Notes (Signed)
 Pt is not compliant with bear hugger, RN will increased rounding

## 2024-05-03 NOTE — Progress Notes (Signed)
 EEG complete - results pending

## 2024-05-03 NOTE — Procedures (Signed)
 Patient Name: Patricia White  MRN: [id number]  Epilepsy Attending: Arlin MALVA Krebs  Referring Physician/Provider: Cindy Garnette POUR, MD  Date: 05/03/2024 Duration: 23.39 mins  Patient history: 53yo F with ams. EEG to evaluate for seizure  Level of alertness: comatose/ lethargic   AEDs during EEG study: Ativan   Technical aspects: This EEG study was done with scalp electrodes positioned according to the 10-20 International system of electrode placement. Electrical activity was reviewed with band pass filter of 1-70Hz , sensitivity of 7 uV/mm, display speed of 79mm/sec with a 60Hz  notched filter applied as appropriate. EEG data were recorded continuously and digitally stored.  Video monitoring was available and reviewed as appropriate.  Description: EEG showed continuous generalized and maximal right frontoparietal region highly epileptiform bursts admixed with 3-5hz  theta-delta slowing. No other clinical signs were noted. Hyperventilation and photic stimulation were not performed.      ABNORMALITY - Polyspikes, generalized and maximal right frontoparietal region - Continuous slow, generalized   IMPRESSION: This study is consistent with patient's history of generalized and maximal right frontoparietal epilepsy. Additionally there was severe diffuse encephalopathy.   Of note, patient has had similar EEG size in past with gradual improvement in mental status without much improvement in EEG. Therefore, it is likely that this is baseline  EEG for this patient.   Katalena Malveaux O Sinan Tuch

## 2024-05-03 NOTE — Hospital Course (Signed)
 53 y.o. female with medical history significant for cerebral palsy, seizure disorder, hypertension, hyperlipidemia, GERD, gastritis, who presents to the ER from SNF due to altered mental status.  Reportedly had abdominal pain, nausea, and decrease in oral intake at the facility.  Patient was started on Seroquel 2 days ago.  The patient is unable to provide a history.  Noted to have decreased level of alertness at her facility.  EMS was activated.   In the ER, hypothermic, with temperature 93.6, Bair hugger applied.  Phenytoin  level greater than 34.  UA negative for pyuria.  Chest x-ray nonacute.  No acute findings on noncontrast CT head, abdomen and pelvis with contrast.  Received IV fluid in the ER.

## 2024-05-03 NOTE — Progress Notes (Signed)
  Progress Note   Patient: Patricia White FMW:[id number] DOB: 11/27/1970 DOA: 05/02/2024     1 DOS: the patient was seen and examined on 05/03/2024   Brief hospital course: 53 y.o. female with medical history significant for cerebral palsy, seizure disorder, hypertension, hyperlipidemia, GERD, gastritis, who presents to the ER from SNF due to altered mental status.  Reportedly had abdominal pain, nausea, and decrease in oral intake at the facility.  Patient was started on Seroquel 2 days ago.  The patient is unable to provide a history.  Noted to have decreased level of alertness at her facility.  EMS was activated.   In the ER, hypothermic, with temperature 93.6, Bair hugger applied.  Phenytoin  level greater than 34.  UA negative for pyuria.  Chest x-ray nonacute.  No acute findings on noncontrast CT head, abdomen and pelvis with contrast.  Received IV fluid in the ER.  Assessment and Plan: Acute toxic metabolic encephalopathy Phenytoin  level greater than 34 Hold off phenytoin  per below, closely monitor Reorient as needed, fall precautions.  Elevated phenytoin  level -Presenting dilantin  level elevated at 34, currently on hold -Of note, discussed with Neurologist, Epileptologist, and pharmacist who do not believe elevated dilantin  would contribute to profound hypothermia requiring warming blanket -f/u on free dilantin  level per Neuro recs -Discussed with Neurology. Once dilantin  level is <20, recommend to resume 100mg  bid   Seizure disorder Continue to hold off home phenytoin  Continue home vimpat , keppra , scheduled klonopin  Seizure precautions and fall precautions IV Ativan  as needed for breakthrough seizures. EEG ordered per Neurology recs   Hypothermia Temperature 93.6 on presentation Per above, doubt effect by elevated dilantin  Warming blanket as needed No leukocytosis or tachycardia.  No bandemia on CBC with differentials. Imagings are non acute UA is negative for pyuria TSH is  elevated, f/u on T4. Of note, on chart review, pt has hx of subclinical hypothyroid with normal fT4 Cortisol normal   Elevated alkaline phosphatase Nonspecific Repeat CMP in the morning   Chronic normocytic anemia At baseline, monitor   QTc prolongation Admission twelve-lead EKG with QTc of 509 Avoid QTc prolonging agents Optimize magnesium  and potassium levels Monitor on telemetry.      Subjective: Difficult to assess given mentation  Physical Exam: Vitals:   05/03/24 0219 05/03/24 0458 05/03/24 0911 05/03/24 1200  BP: (!) 139/95 (!) 139/99 (!) 127/93 124/77  Pulse: 85 (!) 105 100 (!) 103  Resp: 19 15 17 12   Temp: (!) 93.8 F (34.3 C) 97.8 F (36.6 C) 97.9 F (36.6 C) 98.4 F (36.9 C)  TempSrc: Rectal Oral Oral Oral  SpO2: 99% 100% 99% 99%  Weight: 71.7 kg     Height: 5' 5 (1.651 m)      General exam: Awake, laying in bed, in nad Respiratory system: Normal respiratory effort, no wheezing Cardiovascular system: regular rate, s1, s2 Gastrointestinal system: Soft, nondistended, positive BS Central nervous system: CN2-12 grossly intact, no active seizures and tremors Extremities: Perfused, no clubbing Skin: Normal skin turgor, no notable skin lesions seen Psychiatry: Unable to assess given mentation  Data Reviewed:  Labs reviewed: Na 138, K 3.2, Cr 0.91, WBC 6.3, Hgb 10.1, Plts 294  Family Communication: Pt in room, family not at bedside  Disposition: Status is: Inpatient Remains inpatient appropriate because: severity of illness  Planned Discharge Destination: Skilled nursing facility    Author: Garnette Pelt, MD 05/03/2024 5:43 PM  For on call review [email or web address].

## 2024-05-03 NOTE — ED Notes (Signed)
 Pt pulled IV out, RN and tech cleaned pt up, will put orders for IV team.

## 2024-05-04 DIAGNOSIS — R4182 Altered mental status, unspecified: Secondary | ICD-10-CM | POA: Diagnosis not present

## 2024-05-04 LAB — COMPREHENSIVE METABOLIC PANEL WITH GFR
ALT: 21 U/L (ref 0–44)
AST: 25 U/L (ref 15–41)
Albumin: 3.4 g/dL — ABNORMAL LOW (ref 3.5–5.0)
Alkaline Phosphatase: 152 U/L — ABNORMAL HIGH (ref 38–126)
Anion gap: 12 (ref 5–15)
BUN: <5 mg/dL — ABNORMAL LOW (ref 6–20)
CO2: 25 mmol/L (ref 22–32)
Calcium: 9.1 mg/dL (ref 8.9–10.3)
Chloride: 106 mmol/L (ref 98–111)
Creatinine, Ser: 0.69 mg/dL (ref 0.44–1.00)
GFR, Estimated: >60 mL/min (ref >60)
Glucose, Bld: 85 mg/dL (ref 70–99)
Potassium: 4.1 mmol/L (ref 3.5–5.1)
Sodium: 143 mmol/L (ref 135–145)
Total Bilirubin: 0.4 mg/dL (ref 0.0–1.2)
Total Protein: 8.2 g/dL — ABNORMAL HIGH (ref 6.5–8.1)

## 2024-05-04 LAB — CBC
HCT: 31.6 % — ABNORMAL LOW (ref 36.0–46.0)
Hemoglobin: 10.1 g/dL — ABNORMAL LOW (ref 12.0–15.0)
MCH: 26 pg (ref 26.0–34.0)
MCHC: 32 g/dL (ref 30.0–36.0)
MCV: 81.4 fL (ref 80.0–100.0)
Platelets: 323 K/uL (ref 150–400)
RBC: 3.88 MIL/uL (ref 3.87–5.11)
RDW: 16.9 % — ABNORMAL HIGH (ref 11.5–15.5)
WBC: 4.3 K/uL (ref 4.0–10.5)
nRBC: 0 % (ref 0.0–0.2)

## 2024-05-04 LAB — MAGNESIUM: Magnesium: 1.8 mg/dL (ref 1.7–2.4)

## 2024-05-04 LAB — PHENYTOIN LEVEL, TOTAL: Phenytoin Lvl: 23.1 ug/mL — ABNORMAL HIGH (ref 10.0–20.0)

## 2024-05-04 NOTE — Hospital Course (Addendum)
 Patricia White is a 53 y.o. female  with history of cerebral palsy, seizure disorder, hypertension, hyperlipidemia, GERD, gastritis who was admitted for altered mental status.  Altered mental status Seizure disorder Patient was initially admitted to [facility] hospitalist service on 05/02/2024, and family medicine teaching service resumed care on 05/05/2024. This patient has developmental delay and is nonverbal at baseline. Workup in the ED including CT head was unremarkable. Noted to have phenytoin  level greater than 35 which may in-part contribute to her altered mental status however hospitalist discussed with neurologist/epileptologist/pharmacist and all 3 do not believe elevated Dilantin  would be contributing to her hypothermia (see below). Patient also recently started Seroquel and had poor PO intake prior to admission, which may have contributed to her mental status. Dilantin  was held until level less than 20, then resumed at 100 mg twice daily. EEG showed baseline abnormal results, and patient was continued on her home Vimpat /Keppra /Perampanel  scheduled Klonopin . Patient soon returned back to baseline with no further intervention.  Hypothermia Initial temperature 93.6 F at presentation. Initially required warming blanket, but quickly resolved and patient was no longer hypothermic. She underwent a septic workup which was unremarkable.  Her thyroid  studies showed subclinical hypothyroidism and normal cortisol levels. Although hypothermia is resolved, etiology remains unclear.   Other chronic conditions were medically managed with home medications and formulary alternatives as necessary (HTN, HLD, GERD, gastritis )  Follow-up recommendations:

## 2024-05-04 NOTE — Progress Notes (Addendum)
  Progress Note   Patient: Patricia White FMW:[id number] DOB: [birth year] DOA: 05/02/2024     2 DOS: the patient was seen and examined on 05/04/2024   Brief hospital course: 53 y.o. female with medical history significant for cerebral palsy, seizure disorder, hypertension, hyperlipidemia, GERD, gastritis, who presents to the ER from SNF due to altered mental status.  Reportedly had abdominal pain, nausea, and decrease in oral intake at the facility.  Patient was started on Seroquel 2 days ago.  The patient is unable to provide a history.  Noted to have decreased level of alertness at her facility.  EMS was activated.   In the ER, hypothermic, with temperature 93.6, Bair hugger applied.  Phenytoin  level greater than 34.  UA negative for pyuria.  Chest x-ray nonacute.  No acute findings on noncontrast CT head, abdomen and pelvis with contrast.  Received IV fluid in the ER.  Assessment and Plan: Acute toxic metabolic encephalopathy Phenytoin  level greater than 34 Hold off phenytoin  per below, closely monitor Reorient as needed, fall precautions. Mentation seems near baseline at this time  Elevated phenytoin  level -Presenting dilantin  level elevated at 34, currently on hold -Of note, discussed with Neurologist, Epileptologist, and pharmacist who do not believe elevated dilantin  would contribute to profound hypothermia requiring warming blanket -Discussed with Neurology. Once dilantin  level is <20, recommend to resume 100mg  bid -continue to follow dilantin  levels   Seizure disorder Continue to hold off home phenytoin  Continue home vimpat , keppra , scheduled klonopin  Seizure precautions and fall precautions IV Ativan  as needed for breakthrough seizures. Reviewed repeat EEG with Neurologist - baseline abnormal   Hypothermia Temperature 93.6 on presentation Per above, doubt effect by elevated dilantin  Initially required warming blanket, now no longer hypothermic Discussed with Neurology who  recommended focus on septic workup No leukocytosis or tachycardia.  No bandemia on CBC with differentials. Imagings are non acute UA is negative for pyuria TSH is elevated, freeT4 normal. Of note, on chart review, pt has hx of subclinical hypothyroid with normal fT4 Cortisol normal   Elevated alkaline phosphatase Nonspecific Repeat CMP in the morning   Chronic normocytic anemia At baseline, monitor   QTc prolongation Admission twelve-lead EKG with QTc of 509 Avoid QTc prolonging agents Optimize magnesium  and potassium levels Monitor on telemetry.      Subjective: More appropriately conversant this AM  Physical Exam: Vitals:   05/04/24 0842 05/04/24 0900 05/04/24 1107 05/04/24 1348  BP: (!) 149/101 (!) 126/92  99/63  Pulse:  (!) 105  81  Resp: 18  17 10   Temp:    98 F (36.7 C)  TempSrc:    Axillary  SpO2: 98% 98%  99%  Weight:      Height:       General exam: Conversant, in no acute distress Respiratory system: normal chest rise, clear, no audible wheezing Cardiovascular system: regular rhythm, s1-s2 Gastrointestinal system: Nondistended, nontender, pos BS Central nervous system: No active seizures, no tremors Extremities: No cyanosis, no joint deformities Skin: No rashes, no pallor Psychiatry: Affect normal // mood seems normal  Data Reviewed:  Labs reviewed: Na 143, K 4.1, Cr 0.69, Alk phos 152, TB 0.4, WBC 4.3, Hgb 10.1, Plts 323  Family Communication: Pt in room, family not at bedside  Disposition: Status is: Inpatient Remains inpatient appropriate because: severity of illness  Planned Discharge Destination: Skilled nursing facility    Author: Garnette Pelt, MD 05/04/2024 5:01 PM  For on call review [email or web address].

## 2024-05-04 NOTE — TOC Progression Note (Signed)
 Transition of Care [facility]) - Progression Note    Patient Details  Name: Patricia White MRN: [id number] Date of Birth: 1970/10/31  Transition of Care [facility]) CM/SW Contact  Patricia White, [place] Phone Number: 05/04/2024, 10:43 AM  Clinical Narrative:     CSW acknowlege SNF consult- patient arrived from [facility] SNF/ Legal Guardian Nat Chang # [phone]- CSW lefft voice message to return call.   TOC will continue to follow and assist with discharge planning.   Patricia White, MSW, LCSW Clinical Social Worker                      Expected Discharge Plan and Services                                               Social Drivers of Health (SDOH) Interventions SDOH Screenings   Food Insecurity: Patient Unable To Answer (05/03/2024)  Housing: Patient Unable To Answer (05/03/2024)  Transportation Needs: Patient Unable To Answer (05/03/2024)  Utilities: Patient Unable To Answer (05/03/2024)  Alcohol Screen: Low Risk  (03/24/2023)  Depression (PHQ2-9): Low Risk  (08/09/2023)  Financial Resource Strain: Low Risk  (03/24/2023)  Physical Activity: Insufficiently Active (03/24/2023)  Social Connections: Moderately Isolated (11/19/2023)  Stress: No Stress Concern Present (03/24/2023)  Tobacco Use: Low Risk  (05/02/2024)  Health Literacy: Adequate Health Literacy (03/24/2023)    Readmission Risk Interventions    11/16/2023   11:48 AM  Readmission Risk Prevention Plan  Transportation Screening Complete  PCP or Specialist Appt within 5-7 Days Complete  Home Care Screening Complete  Medication Review (RN CM) Complete

## 2024-05-05 DIAGNOSIS — T68XXXA Hypothermia, initial encounter: Secondary | ICD-10-CM

## 2024-05-05 DIAGNOSIS — R7889 Finding of other specified substances, not normally found in blood: Secondary | ICD-10-CM | POA: Diagnosis not present

## 2024-05-05 DIAGNOSIS — R400 Somnolence: Secondary | ICD-10-CM

## 2024-05-05 DIAGNOSIS — G40909 Epilepsy, unspecified, not intractable, without status epilepticus: Secondary | ICD-10-CM

## 2024-05-05 LAB — COMPREHENSIVE METABOLIC PANEL WITH GFR
ALT: 23 U/L (ref 0–44)
AST: 35 U/L (ref 15–41)
Albumin: 3.2 g/dL — ABNORMAL LOW (ref 3.5–5.0)
Alkaline Phosphatase: 152 U/L — ABNORMAL HIGH (ref 38–126)
Anion gap: 13 (ref 5–15)
BUN: 9 mg/dL (ref 6–20)
CO2: 23 mmol/L (ref 22–32)
Calcium: 8.9 mg/dL (ref 8.9–10.3)
Chloride: 103 mmol/L (ref 98–111)
Creatinine, Ser: 0.68 mg/dL (ref 0.44–1.00)
GFR, Estimated: >60 mL/min (ref >60)
Glucose, Bld: 104 mg/dL — ABNORMAL HIGH (ref 70–99)
Potassium: 3.7 mmol/L (ref 3.5–5.1)
Sodium: 139 mmol/L (ref 135–145)
Total Bilirubin: 0.2 mg/dL (ref 0.0–1.2)
Total Protein: 7.8 g/dL (ref 6.5–8.1)

## 2024-05-05 LAB — CBC
HCT: 32.3 % — ABNORMAL LOW (ref 36.0–46.0)
Hemoglobin: 10 g/dL — ABNORMAL LOW (ref 12.0–15.0)
MCH: 25.9 pg — ABNORMAL LOW (ref 26.0–34.0)
MCHC: 31 g/dL (ref 30.0–36.0)
MCV: 83.7 fL (ref 80.0–100.0)
Platelets: 237 K/uL (ref 150–400)
RBC: 3.86 MIL/uL — ABNORMAL LOW (ref 3.87–5.11)
RDW: 17.1 % — ABNORMAL HIGH (ref 11.5–15.5)
WBC: 4.1 K/uL (ref 4.0–10.5)
nRBC: 0 % (ref 0.0–0.2)

## 2024-05-05 LAB — PHENYTOIN LEVEL, TOTAL: Phenytoin Lvl: 24.1 ug/mL — ABNORMAL HIGH (ref 10.0–20.0)

## 2024-05-05 MED ORDER — PERAMPANEL 2 MG PO TABS
4.0000 mg | ORAL_TABLET | Freq: Every day | ORAL | Status: DC
  Administered 2024-05-05 – 2024-05-07 (×3): 4 mg via ORAL
  Filled 2024-05-05 (×3): qty 2

## 2024-05-05 NOTE — Assessment & Plan Note (Signed)
 Asthma - not currently being medicated; asymptomatic

## 2024-05-05 NOTE — Plan of Care (Cosign Needed)
 Attempted to contact Nat Chang (patient's responsible party and main point of contact) via phone at [phone], but was unable to reach her. Left a HIPAA compliant voicemail. Will attempt to reach Ms. Draper again later.

## 2024-05-05 NOTE — Assessment & Plan Note (Addendum)
 Temperature 93.6 on admission. No longer hypothermic after warming blanket. Sepsis workup was unremarkable. Neurology did not believe elevated dilantin  contributed to profound hypothermia. - Blood culturesx2 NGTD

## 2024-05-05 NOTE — Assessment & Plan Note (Addendum)
 Repeat EEG appears at baseline. - Holding home phenytoin  as above - Restarted home parempanel - Continue home vimpat , Keppra , scheduled klonopin  - Seizure and fall precautions - IV Ativan  as needed for breakthrough seizures

## 2024-05-05 NOTE — TOC Progression Note (Addendum)
 Transition of Care [facility]) - Progression Note    Patient Details  Name: Patricia White MRN: [id number] Date of Birth: 05-Aug-1971  Transition of Care [facility]) CM/SW Contact  Isaiah Public, LCSWA Phone Number: 05/05/2024, 11:12 AM  Clinical Narrative:     CSW LVM for  Nat. CSW awaiting call back to discuss patients dc plan. CSW will continue to follow.  Update- CSW received call back from [place]iss[place] D. Patients responsible party and main point of contact. Nat informed CSW that patient does not have a legal guardian. Nat confirmed that plan is for patient to return back to [facility] when medically ready for dc. Nat request for MD to give her a call. CSW informed MD. Madelin with [facility] confirmed they can accept patient back when medically ready for dc. CSW will continue to follow.                     Expected Discharge Plan and Services                                               Social Drivers of Health (SDOH) Interventions SDOH Screenings   Food Insecurity: Patient Unable To Answer (05/03/2024)  Housing: Patient Unable To Answer (05/03/2024)  Transportation Needs: Patient Unable To Answer (05/03/2024)  Utilities: Patient Unable To Answer (05/03/2024)  Alcohol Screen: Low Risk  (03/24/2023)  Depression (PHQ2-9): Low Risk  (08/09/2023)  Financial Resource Strain: Low Risk  (03/24/2023)  Physical Activity: Insufficiently Active (03/24/2023)  Social Connections: Moderately Isolated (11/19/2023)  Stress: No Stress Concern Present (03/24/2023)  Tobacco Use: Low Risk  (05/02/2024)  Health Literacy: Adequate Health Literacy (03/24/2023)    Readmission Risk Interventions    11/16/2023   11:48 AM  Readmission Risk Prevention Plan  Transportation Screening Complete  PCP or Specialist Appt within 5-7 Days Complete  Home Care Screening Complete  Medication Review (RN CM) Complete

## 2024-05-05 NOTE — Assessment & Plan Note (Addendum)
 AMS was most likely multifactorial related to elevated phenytoin  level >34 on admission, poor PO intake, and recent initiation of Seroquel. Patient alert and responds to questions non-verbally. Phenytoin  level today is 24.1. - Hold phenytoin  for now, continue following dilantin  levels - Neurology consulted, appreciate recs  -Recommended once dilantin  level is <20 to resume 100 mg BID - Fall precautions

## 2024-05-05 NOTE — Progress Notes (Addendum)
     Daily Progress Note Intern Pager: [phone]  Patient name: Patricia White Medical record number: [id number] Date of birth: 1971-02-24 Age: 53 y.o. Gender: female  Primary Care Provider: Alba Sharper, MD Consultants: Neurology Code Status: FULL  Pt Overview and Major Events to Date:  9/19 - Admitted  Patricia White is a 53 year old female presenting with altered mental status, abdominal pain, nausea, and decrease in oral intake at her facility. Pertinent PMH/PSH includes cerebral palsy, intellectual disability, seizure disorder, HTN, HLD, GERD, gastritis. Assessment & Plan AMS (altered mental status) Elevated phenytoin  level AMS was most likely multifactorial related to elevated phenytoin  level >34 on admission, poor PO intake, and recent initiation of Seroquel. Patient alert and responds to questions non-verbally. Phenytoin  level today is 24.1. - Hold phenytoin  for now, continue following dilantin  levels - Neurology consulted, appreciate recs  -Recommended once dilantin  level is <20 to resume 100 mg BID - Fall precautions Seizure disorder (HCC) Repeat EEG appears at baseline. - Holding home phenytoin  as above - Restarted home parempanel - Continue home vimpat , Keppra , scheduled klonopin  - Seizure and fall precautions - IV Ativan  as needed for breakthrough seizures Hypothermia (Resolved: 05/05/2024) Temperature 93.6 on admission. No longer hypothermic after warming blanket. Sepsis workup was unremarkable. Neurology did not believe elevated dilantin  contributed to profound hypothermia. - Blood culturesx2 NGTD Chronic health problem Asthma - not currently being medicated; asymptomatic  FEN/GI: Regular diet PPx: Lovenox  Dispo:SNF pending clinical improvement .  Subjective:  Patient seen at bedside. She is alert and attentive. She is able to respond to questions using non-verbal gestures. She appears to have difficulty feeding herself.  Objective: Temp:  [97.5 F (36.4  C)-98 F (36.7 C)] 97.5 F (36.4 C) (09/22 0808) Pulse Rate:  [69-105] 69 (09/22 0808) Resp:  [10-17] 14 (09/22 0808) BP: (99-135)/(63-92) 100/71 (09/22 0808) SpO2:  [97 %-100 %] 100 % (09/22 9191)  Physical Exam: General: alert, resting in bed, NAD Cardiovascular: regular rate and rhythm; normal S1/S2 Respiratory: clear to auscultation bilaterally; normal effort on RA Abdomen: normal bowel sounds; non-tender, soft, non-distended Extremities: warm and well-perfused; non-edematous  Laboratory: Most recent CBC Lab Results  Component Value Date   WBC 4.1 05/05/2024   HGB 10.0 (L) 05/05/2024   HCT 32.3 (L) 05/05/2024   MCV 83.7 05/05/2024   PLT 237 05/05/2024   Most recent BMP    Latest Ref Rng & Units 05/05/2024    5:18 AM  BMP  Glucose 70 - 99 mg/dL 895   BUN 6 - 20 mg/dL 9   Creatinine 9.55 - 8.99 mg/dL 9.31   Sodium 864 - 854 mmol/L 139   Potassium 3.5 - 5.1 mmol/L 3.7   Chloride 98 - 111 mmol/L 103   CO2 22 - 32 mmol/L 23   Calcium  8.9 - 10.3 mg/dL 8.9     Other pertinent labs: Phenytoin  level 24.1  Imaging/Diagnostic Tests: No new imaging  Mannie Ashley SAILOR, MD 05/05/2024, 8:54 AM  PGY-1, [facility] FPTS Intern pager: [phone], text pages welcome Secure chat group [facility] Teaching Service

## 2024-05-05 NOTE — Progress Notes (Signed)
 Unable to complete admission due to patient AMS  2nd attempt last was 9/20

## 2024-05-05 NOTE — Progress Notes (Signed)
 Per TOC note patient does not have a legal guardian dispite what file has documented.

## 2024-05-05 NOTE — Plan of Care (Signed)
   Problem: Nutrition: Goal: Adequate nutrition will be maintained Outcome: Adequate for Discharge

## 2024-05-06 DIAGNOSIS — R7889 Finding of other specified substances, not normally found in blood: Secondary | ICD-10-CM | POA: Diagnosis not present

## 2024-05-06 DIAGNOSIS — G40909 Epilepsy, unspecified, not intractable, without status epilepticus: Secondary | ICD-10-CM | POA: Diagnosis not present

## 2024-05-06 LAB — CBC
HCT: 30.4 % — ABNORMAL LOW (ref 36.0–46.0)
Hemoglobin: 9.6 g/dL — ABNORMAL LOW (ref 12.0–15.0)
MCH: 26.2 pg (ref 26.0–34.0)
MCHC: 31.6 g/dL (ref 30.0–36.0)
MCV: 83.1 fL (ref 80.0–100.0)
Platelets: 331 K/uL (ref 150–400)
RBC: 3.66 MIL/uL — ABNORMAL LOW (ref 3.87–5.11)
RDW: 16.7 % — ABNORMAL HIGH (ref 11.5–15.5)
WBC: 4.8 K/uL (ref 4.0–10.5)
nRBC: 0 % (ref 0.0–0.2)

## 2024-05-06 LAB — COMPREHENSIVE METABOLIC PANEL WITH GFR
ALT: 28 U/L (ref 0–44)
AST: 47 U/L — ABNORMAL HIGH (ref 15–41)
Albumin: 3 g/dL — ABNORMAL LOW (ref 3.5–5.0)
Alkaline Phosphatase: 154 U/L — ABNORMAL HIGH (ref 38–126)
Anion gap: 11 (ref 5–15)
BUN: 8 mg/dL (ref 6–20)
CO2: 27 mmol/L (ref 22–32)
Calcium: 8.8 mg/dL — ABNORMAL LOW (ref 8.9–10.3)
Chloride: 102 mmol/L (ref 98–111)
Creatinine, Ser: 0.72 mg/dL (ref 0.44–1.00)
GFR, Estimated: >60 mL/min (ref >60)
Glucose, Bld: 89 mg/dL (ref 70–99)
Potassium: 3.7 mmol/L (ref 3.5–5.1)
Sodium: 140 mmol/L (ref 135–145)
Total Bilirubin: 0.3 mg/dL (ref 0.0–1.2)
Total Protein: 7.7 g/dL (ref 6.5–8.1)

## 2024-05-06 LAB — PHENYTOIN LEVEL, TOTAL: Phenytoin Lvl: 24.5 ug/mL — ABNORMAL HIGH (ref 10.0–20.0)

## 2024-05-06 MED ORDER — PERAMPANEL 4 MG PO TABS: 4.0000 mg | ORAL_TABLET | Freq: Every day | ORAL | 0 refills | Status: DC

## 2024-05-06 MED ORDER — LACOSAMIDE 200 MG PO TABS: 200.0000 mg | ORAL_TABLET | Freq: Two times a day (BID) | ORAL | 0 refills | Status: DC

## 2024-05-06 MED ORDER — CLONAZEPAM 0.5 MG PO TABS: 0.5000 mg | ORAL_TABLET | Freq: Three times a day (TID) | ORAL | 0 refills | Status: DC

## 2024-05-06 NOTE — Plan of Care (Signed)
   Problem: Nutrition: Goal: Adequate nutrition will be maintained Outcome: Adequate for Discharge

## 2024-05-06 NOTE — Assessment & Plan Note (Addendum)
 Repeat EEG appears at baseline. - Holding home phenytoin  as above - Restarted home parempanel - Continue home vimpat , Keppra , scheduled klonopin  - Seizure precautions - IV Ativan  as needed for breakthrough seizures

## 2024-05-06 NOTE — Discharge Summary (Cosign Needed Addendum)
 Family Medicine Teaching [facility] Discharge Summary   Patient name: Patricia White Medical record number: [id number] Date of birth: 11/30/1970      Age: 53 y.o.  Gender: female Date of Admission: 05/02/2024                Date of Discharge: 05/07/24 Admitting Physician: Terry LOISE Hurst, DO   Primary Care Provider: Alba Sharper, MD Consultants: Neurology   Indication for Hospitalization: AMS   Discharge Diagnoses/Problem List:  Principal Problem for Admission:  Other Problems addressed during stay: altered mental status Active Problems:   Seizure disorder (HCC)   Elevated phenytoin  level   Brief Hospital Course:  Patricia White is a 52 y.o. female with history of cerebral palsy, seizure disorder, hypertension, hyperlipidemia, GERD, gastritis who was admitted for altered mental status.   Altered mental status Seizure disorder Patient was initially [facility] on 05/02/2024, and Family Medicine Teaching Service assumed care on 05/05/2024. Patient has an intellectual disability and is mostly nonverbal at baseline. Workup in the ED, including CT head was unremarkable. Noted to have phenytoin  level >35 which may in part have contributed to her altered mental status however hospitalist discussed with neurologist/epileptologist/pharmacist and all three do not believe elevated Dilantin  would be contributing to her hypothermia (see below). Patient also recently started Seroquel and had poor PO intake prior to admission, which may have contributed to her mental status as well. Neurology was consulted for evaluation and to perform EEG. EEG was consistent with patient's history of generalized and maximal right frontoparietal epilepsy. Patient was continued on her home Vimpat , Keppra , Parempanel, and Klonopin . Patient soon returned to baseline mental status with no further intervention. At the time of discharge, patient's phenytoin  level was 20. She will need  phenytoin  level re-checked at SNF. Per neurology, once phenytoin  level is less than 20, Phenytoin  may be restarted at 100 mg twice daily and titrated as needed.   Hypothermia Initial temperature 93.6 F at presentation. Initially required warming blanket, but quickly resolved and patient was no longer hypothermic. She underwent septic workup which was unremarkable. Her thyroid  studies showed subclinical hypothyroidism and normal cortisol levels. Although hypothermia is resolved, etiology remains unclear.   Other chronic conditions were medically managed with home medications and formulary alternatives as necessary (HTN, HLD, GERD, gastritis).   Follow-up recommendations: Ensure phenytoin  levels is re-checked. Once phenytoin  level is LESS THAN 20, it is safe to resume Dilantin  100 mg twice daily for seizure prophylaxis.     Results/Tests Pending at Time of Discharge: None  Disposition: Returning to previous LTC facility   Discharge Condition: Stable   Discharge Exam:  Vitals:   05/07/24 0822 05/07/24 0925  BP: (!) 159/108 (!) 151/67  Pulse:    Resp: 13 15  Temp: 98.2 F (36.8 C) 98.1 F (36.7 C)  SpO2: 95%    Physical Exam: General: alert, interactive, sitting up, NAD Cardiovascular: regular rate and rhythm; normal S1/S2 Respiratory: clear to auscultation bilaterally; normal effort on RA Abdomen: normal bowel sounds; non-tender, soft, non-distended Extremities: warm and well-perfused; non-edematous   Significant Procedures: None   Significant Labs and Imaging:     Latest Ref Rng & Units 05/06/2024    5:06 AM 05/05/2024    5:18 AM 05/04/2024    3:10 AM  CBC  WBC 4.0 - 10.5 K/uL 4.8  4.1  4.3   Hemoglobin 12.0 - 15.0 g/dL 9.6  89.9  89.8   Hematocrit 36.0 - 46.0 %  30.4  32.3  31.6   Platelets 150 - 400 K/uL 331  237  323       Latest Ref Rng & Units 05/07/2024    5:45 AM 05/06/2024    5:06 AM 05/05/2024    5:18 AM  CMP  Glucose 70 - 99 mg/dL 83  89  895   BUN 6 - 20  mg/dL 5  8  9    Creatinine 0.44 - 1.00 mg/dL 9.32  9.27  9.31   Sodium 135 - 145 mmol/L 139  140  139   Potassium 3.5 - 5.1 mmol/L 3.6  3.7  3.7   Chloride 98 - 111 mmol/L 103  102  103   CO2 22 - 32 mmol/L 26  27  23    Calcium  8.9 - 10.3 mg/dL 8.8  8.8  8.9   Total Protein 6.5 - 8.1 g/dL  7.7  7.8   Total Bilirubin 0.0 - 1.2 mg/dL  0.3  0.2   Alkaline Phos 38 - 126 U/L  154  152   AST 15 - 41 U/L  47  35   ALT 0 - 44 U/L  28  23    CT ABDOMEN PELVIS W CONTRAST Result Date: 05/02/2024 IMPRESSION: 1. No acute findings in the abdomen or pelvis. 2. Uterine fibroids.    CT Head Wo Contrast Result Date: 05/02/2024 IMPRESSION: 1. No acute intracranial abnormality.    DG Chest Port 1 View Result Date: 05/02/2024 IMPRESSION: No active disease.    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 Additionally there was severe diffuse encephalopathy.   Discharge Medications:  Allergies as of 05/07/2024   No Known Allergies      Medication List     PAUSE taking these medications    indapamide  1.25 MG tablet Wait to take this until your doctor or other care provider tells you to start again. Commonly known as: LOZOL  Take 1.25 mg by mouth daily.   losartan  100 MG tablet Wait to take this until your doctor or other care provider tells you to start again. Commonly known as: COZAAR  Take 50 mg by mouth at bedtime.   phenytoin  50 MG tablet Wait to take this until your doctor or other care provider tells you to start again. Commonly known as: DILANTIN  Chew 2 tablets (100 mg total) by mouth daily AND 3 tablets (150 mg total) at bedtime.   QUEtiapine 25 MG tablet Wait to take this until your doctor or other care provider tells you to start again. Commonly known as: SEROQUEL Take 25 mg by mouth 2 (two) times daily.       STOP taking these medications    methocarbamol 500 MG tablet Commonly known  as: ROBAXIN       TAKE these medications    acetaminophen  325 MG tablet Commonly known as: TYLENOL  Take 2 tablets (650 mg total) by mouth every 6 (six) hours as needed for mild pain (pain score 1-3) or fever.   clonazePAM  0.5 MG tablet Commonly known as: KLONOPIN  Take 1 tablet (0.5 mg total) by mouth 3 (three) times daily.   lacosamide  200 MG Tabs tablet Commonly known as: VIMPAT  Take 1 tablet (200 mg total) by mouth 2 (two) times daily.   levETIRAcetam  750 MG tablet Commonly known as: KEPPRA  Take 2 tablets (1,500 mg total) by mouth 2 (two) times daily.   mirtazapine 15 MG tablet Commonly known as: REMERON Take 15 mg  by mouth at bedtime.   pantoprazole  40 MG tablet Commonly known as: PROTONIX  Take 1 tablet (40 mg total) by mouth daily.   Perampanel  4 MG Tabs Take 1 tablet (4 mg total) by mouth daily.   polyethylene glycol 17 g packet Commonly known as: MIRALAX  / GLYCOLAX  Take 17 g by mouth daily. What changed:  how much to take additional instructions   rosuvastatin  20 MG tablet Commonly known as: CRESTOR  Take 1 tablet (20 mg total) by mouth daily.   senna-docusate 8.6-50 MG tablet Commonly known as: Senokot-S Take 1 tablet by mouth daily.       Discharge Instructions: Please refer to Patient Instructions section of EMR for full details.  Patient was counseled important signs and symptoms that should prompt return to medical care, changes in medications, dietary instructions, activity restrictions, and follow up appointments.    Follow-Up Appointments: None   Mannie Ashley SAILOR, MD 05/07/2024, 11:40 AM PGY-1, [facility] Family Medicine  I have discussed the above with Dr. Mannie and agree with the documented plan. My edits for correction/addition/clarification are included above. Please see any attending notes.   Kathrine Melena, DO PGY-2, [facility] 05/07/2024 12:28 PM

## 2024-05-06 NOTE — Plan of Care (Signed)

## 2024-05-06 NOTE — Progress Notes (Cosign Needed Addendum)
     Daily Progress Note Intern Pager: [phone]  Patient name: Patricia White Medical record number: [id number] Date of birth: 1971-03-27 Age: 53 y.o. Gender: female  Primary Care Provider: Alba Sharper, MD Consultants: Neurology Code Status: FULL  Pt Overview and Major Events to Date:  9/19 - Admitted  Patricia White is a 53 year old female presenting with altered mental status, abdominal pain, nausea, and decrease in oral intake at her facility. AMS was most likely multifactorial related to elevated phenytoin  level >34 on admission, poor PO intake, and recent initiation of Seroquel. Pertinent PMH/PSH includes cerebral palsy, intellectual disability, seizure disorder, HTN, HLD, GERD, gastritis.  Assessment & Plan AMS (altered mental status) Elevated phenytoin  level Mentation improved and appears at baseline. Patient interactive and responds verbally to questions. Phenytoin  level today is 24.5. - Hold phenytoin  for now, continue following dilantin  levels - Neurology consulted, appreciate recs  -Recommended once dilantin  level is <20 to resume 100 mg BID - Fall precautions Seizure disorder (HCC) Repeat EEG appears at baseline. - Holding home phenytoin  as above - Restarted home parempanel - Continue home vimpat , Keppra , scheduled klonopin  - Seizure precautions - IV Ativan  as needed for breakthrough seizures Chronic health problem Asthma - not currently being medicated; asymptomatic  FEN/GI: Regular diet PPx: Lovenox  Dispo:SNF pending clinical improvement .  Subjective:  Patient seen at bedside. Appears alert and more interactive today. Verbally responds to questions. Mentions her family members and is able to name them. Mentation appears improved and at baseline.  Objective: Temp:  [97.5 F (36.4 C)-97.8 F (36.6 C)] 97.6 F (36.4 C) (09/23 0359) Pulse Rate:  [69-96] 96 (09/23 0359) Resp:  [14-18] 18 (09/23 0359) BP: (100-151)/(71-89) 151/86 (09/23 0359) SpO2:  [95  %-100 %] 95 % (09/23 0359)  Physical Exam: General: alert, interactive, sitting up, NAD Cardiovascular: regular rate and rhythm; normal S1/S2 Respiratory: clear to auscultation bilaterally; normal effort on RA Abdomen: normal bowel sounds; non-tender, soft, non-distended Extremities: warm and well-perfused; non-edematous  Laboratory: Most recent CBC Lab Results  Component Value Date   WBC 4.8 05/06/2024   HGB 9.6 (L) 05/06/2024   HCT 30.4 (L) 05/06/2024   MCV 83.1 05/06/2024   PLT 331 05/06/2024   Most recent BMP    Latest Ref Rng & Units 05/06/2024    5:06 AM  BMP  Glucose 70 - 99 mg/dL 89   BUN 6 - 20 mg/dL 8   Creatinine 9.55 - 8.99 mg/dL 9.27   Sodium 864 - 854 mmol/L 140   Potassium 3.5 - 5.1 mmol/L 3.7   Chloride 98 - 111 mmol/L 102   CO2 22 - 32 mmol/L 27   Calcium  8.9 - 10.3 mg/dL 8.8     Other pertinent labs: Phenytoin  level 24.5  Imaging/Diagnostic Tests: No new imaging  Mannie Ashley SAILOR, MD 05/06/2024, 7:27 AM  PGY-1, [facility] FPTS Intern pager: [phone], text pages welcome Secure chat group [facility] Teaching Service   Addendum: Signed progress note for fellow intern (she was unable to do so prior to leaving for the day) Alan Flies, MD 05/06/24 5:50 PM

## 2024-05-06 NOTE — TOC Progression Note (Addendum)
 Transition of Care [facility]) - Progression Note    Patient Details  Name: Patricia White MRN: [id number] Date of Birth: 1970-10-28  [facility] CM/SW Contact  Isaiah Public, LCSWA Phone Number: 05/06/2024, 1:03 PM  Clinical Narrative:     Patients passr pending. CSW submitted requested clinicals to [facility] must for review. Tammy with [facility] informed CSW that once Passr approved and patient medically ready, facility can accept patient back . CSW informed MD.                     Expected Discharge Plan and Services         Expected Discharge Date: 05/06/24                                     Social Drivers of Health (SDOH) Interventions SDOH Screenings   Food Insecurity: Patient Unable To Answer (05/03/2024)  Housing: Patient Unable To Answer (05/03/2024)  Transportation Needs: Patient Unable To Answer (05/03/2024)  Utilities: Patient Unable To Answer (05/03/2024)  Alcohol Screen: Low Risk  (03/24/2023)  Depression (PHQ2-9): Low Risk  (08/09/2023)  Financial Resource Strain: Low Risk  (03/24/2023)  Physical Activity: Insufficiently Active (03/24/2023)  Social Connections: Moderately Isolated (11/19/2023)  Stress: No Stress Concern Present (03/24/2023)  Tobacco Use: Low Risk  (05/02/2024)  Health Literacy: Adequate Health Literacy (03/24/2023)    Readmission Risk Interventions    11/16/2023   11:48 AM  Readmission Risk Prevention Plan  Transportation Screening Complete  PCP or Specialist Appt within 5-7 Days Complete  Home Care Screening Complete  Medication Review (RN CM) Complete

## 2024-05-06 NOTE — Assessment & Plan Note (Addendum)
 Asthma - not currently being medicated; asymptomatic

## 2024-05-06 NOTE — Progress Notes (Deleted)
 Family Medicine Teaching [facility] Discharge Summary  Patient name: Patricia White Medical record number: [id number] Date of birth: February 11, 1971 Age: 53 y.o. Gender: female Date of Admission: 05/02/2024  Date of Discharge: 05/06/24 Admitting Physician: Terry LOISE Hurst, DO  Primary Care Provider: Alba Sharper, MD Consultants: Neurology  Indication for Hospitalization: altered mental status  Discharge Diagnoses/Problem List:  Principal Problem for Admission:  Other Problems addressed during stay: altered mental status Active Problems:   Seizure disorder (HCC)   Elevated phenytoin  level  Brief Hospital Course:  Patricia White is a 53 y.o. female with history of cerebral palsy, seizure disorder, hypertension, hyperlipidemia, GERD, gastritis who was admitted for altered mental status.  Altered mental status Seizure disorder Patient was initially [facility] on 05/02/2024, and Family Medicine Teaching Service resumed care on 05/05/2024. Patient has intellectual disability and is mostly nonverbal at baseline. Work-up in the ED, including CT head was unremarkable. Noted to have phenytoin  level greater than 35 which may in-part have contributed to her altered mental status however hospitalist discussed with neurologist/epileptologist/pharmacist and all three do not believe elevated Dilantin  would be contributing to her hypothermia (see below). Patient also recently started Seroquel and had poor PO intake prior to admission, which may have contributed to her mental status as well. Neurology was consulted for evaluation and to perform EEG. EEG was consistent with patient's history of generalized and maximal right frontoparietal epilepsy. Patient was continued on her home Vimpat , Keppra , Parempanel, and Klonopin . Patient soon returned to baseline mental status with no further intervention. At the time of discharge, patient's phenytoin  level remained elevated above 20.  She will need phenytoin  level re-checked. Per neurology, once phenytoin  level is less than 20, dilantin  may be restarted at 100 mg twice daily.  Hypothermia Initial temperature 93.6 F at presentation. Initially required warming blanket, but quickly resolved and patient was no longer hypothermic. She underwent a septic workup which was unremarkable. Her thyroid  studies showed subclinical hypothyroidism and normal cortisol levels. Although hypothermia is resolved, etiology remains unclear.  Other chronic conditions were medically managed with home medications and formulary alternatives as necessary (HTN, HLD, GERD, gastritis).  Follow-up recommendations: Ensure phenytoin  levels are checked daily. Once phenytoin  level is LESS THAN 20, it is safe to resume Dilantin  100 mg twice daily for seizure prophylaxis.   Results/Tests Pending at Time of Discharge:  Unresulted Labs (From admission, onward)     Start     Ordered   05/03/24 1021  Phenytoin  level, free and total  Once,   R       Question:  Specimen collection method  Answer:  Lab=Lab collect   05/03/24 1020           Disposition: Returning to previous LTC facility  Discharge Condition: Stable  Discharge Exam:  Vitals:   05/06/24 0735 05/06/24 1115  BP: (!) 135/100 (!) 131/99  Pulse: 80   Resp:  19  Temp: (!) 97.5 F (36.4 C) (!) 97.5 F (36.4 C)  SpO2:     Physical Exam: General: alert, interactive, sitting up, NAD Cardiovascular: regular rate and rhythm; normal S1/S2 Respiratory: clear to auscultation bilaterally; normal effort on RA Abdomen: normal bowel sounds; non-tender, soft, non-distended Extremities: warm and well-perfused; non-edematous  Significant Procedures: None  Significant Labs and Imaging:  Recent Labs  Lab 05/05/24 0518 05/06/24 0506  WBC 4.1 4.8  HGB 10.0* 9.6*  HCT 32.3* 30.4*  PLT 237 331   Recent Labs  Lab 05/05/24 0518  05/06/24 0506  NA 139 140  K 3.7 3.7  CL 103 102  CO2 23 27   GLUCOSE 104* 89  BUN 9 8  CREATININE 0.68 0.72  CALCIUM  8.9 8.8*  ALKPHOS 152* 154*  AST 35 47*  ALT 23 28  ALBUMIN 3.2* 3.0*    CT ABDOMEN PELVIS W CONTRAST Result Date: 05/02/2024 IMPRESSION: 1. No acute findings in the abdomen or pelvis. 2. Uterine fibroids.   CT Head Wo Contrast Result Date: 05/02/2024 IMPRESSION: 1. No acute intracranial abnormality.   DG Chest Port 1 View Result Date: 05/02/2024 IMPRESSION: No active disease.   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 Additionally there was severe diffuse encephalopathy.  Discharge Medications:  Allergies as of 05/06/2024   No Known Allergies      Medication List     PAUSE taking these medications    indapamide  1.25 MG tablet Wait to take this until your doctor or other care provider tells you to start again. Commonly known as: LOZOL  Take 1.25 mg by mouth daily.   losartan  100 MG tablet Wait to take this until your doctor or other care provider tells you to start again. Commonly known as: COZAAR  Take 50 mg by mouth at bedtime.   phenytoin  50 MG tablet Wait to take this until your doctor or other care provider tells you to start again. Commonly known as: DILANTIN  Chew 2 tablets (100 mg total) by mouth daily AND 3 tablets (150 mg total) at bedtime.   QUEtiapine 25 MG tablet Wait to take this until your doctor or other care provider tells you to start again. Commonly known as: SEROQUEL Take 25 mg by mouth 2 (two) times daily.       STOP taking these medications    methocarbamol 500 MG tablet Commonly known as: ROBAXIN       TAKE these medications    acetaminophen  325 MG tablet Commonly known as: TYLENOL  Take 2 tablets (650 mg total) by mouth every 6 (six) hours as needed for mild pain (pain score 1-3) or fever.   clonazePAM  0.5 MG tablet Commonly known as: KLONOPIN  Take 1 tablet (0.5 mg total) by  mouth 3 (three) times daily.   lacosamide  200 MG Tabs tablet Commonly known as: VIMPAT  Take 1 tablet (200 mg total) by mouth 2 (two) times daily.   levETIRAcetam  750 MG tablet Commonly known as: KEPPRA  Take 2 tablets (1,500 mg total) by mouth 2 (two) times daily.   mirtazapine 15 MG tablet Commonly known as: REMERON Take 15 mg by mouth at bedtime.   pantoprazole  40 MG tablet Commonly known as: PROTONIX  Take 1 tablet (40 mg total) by mouth daily.   Perampanel  4 MG Tabs Take 1 tablet (4 mg total) by mouth daily.   polyethylene glycol 17 g packet Commonly known as: MIRALAX  / GLYCOLAX  Take 17 g by mouth daily. What changed:  how much to take additional instructions   rosuvastatin  20 MG tablet Commonly known as: CRESTOR  Take 1 tablet (20 mg total) by mouth daily.   senna-docusate 8.6-50 MG tablet Commonly known as: Senokot-S Take 1 tablet by mouth daily.        Discharge Instructions: Please refer to Patient Instructions section of EMR for full details.  Patient was counseled important signs and symptoms that should prompt return to medical care, changes in medications, dietary instructions, activity restrictions, and follow up appointments.   Follow-Up Appointments: None   Mannie,  Ashley SAILOR, MD 05/06/2024, 11:55 AM PGY-1, [facility]

## 2024-05-06 NOTE — Assessment & Plan Note (Addendum)
 Mentation improved and appears at baseline. Patient interactive and responds verbally to questions. Phenytoin  level today is 24.5. - Hold phenytoin  for now, continue following dilantin  levels - Neurology consulted, appreciate recs  -Recommended once dilantin  level is <20 to resume 100 mg BID - Fall precautions

## 2024-05-06 NOTE — Plan of Care (Signed)
  Problem: Nutrition: Goal: Adequate nutrition will be maintained 05/06/2024 0727 by Almarie Alberteen SQUIBB, RN Outcome: Adequate for Discharge 05/06/2024 0727 by Almarie Alberteen SQUIBB, RN Outcome: Adequate for Discharge

## 2024-05-06 NOTE — Care Management Important Message (Signed)
 Important Message  Patient Details  Name: Patricia White MRN: [id number] Date of Birth: 1971/07/18   Important Message Given:  Yes - Medicare IM     [name] 05/06/2024, 10:24 AM

## 2024-05-06 NOTE — NC FL2 (Signed)
 [place]  MEDICAID FL2 LEVEL OF CARE FORM     IDENTIFICATION  Patient Name: Patricia White Birthdate: 03/27/1971 Sex: female Admission Date (Current Location): 05/02/2024  [facility] and [place] Number:  Producer, television/film/video and Address:  [facility] [place], [address], [place], [place] [address]      Provider Number: [id number]  Attending Physician Name and Address:  McDiarmid, Krystal BIRCH, MD  Relative Name and Phone Number:  Nat Chang (Legal Guardian) [phone], Clement Haddock (Legal Guardian) [phone]    Current Level of Care: Hospital Recommended Level of Care: Skilled Nursing Facility Prior Approval Number:    Date Approved/Denied:   PASRR Number: PASRR under review  Discharge Plan: SNF    Current Diagnoses: Patient Active Problem List   Diagnosis Date Noted   Elevated phenytoin  level 05/05/2024   AMS (altered mental status) 05/02/2024   Sepsis (HCC) 02/06/2024   Blood per rectum 01/02/2024   Ulcer of upper extremity 12/31/2023   Facial swelling 12/30/2023   Open wound of left upper arm 12/29/2023   Sinus tachycardia 12/29/2023   Pressure injury of skin of left elbow 12/29/2023   Intellectual disability 12/28/2023   Cerebral palsy (HCC) 12/28/2023   Infestation by bed bug 12/27/2023   Seizure-like activity (HCC) 12/27/2023   Left arm pain 11/24/2023   Hypotension 11/23/2023   Left arm swelling 11/23/2023   Status epilepticus (HCC) 11/19/2023   Pancytopenia (HCC) 11/11/2023   Hypomagnesemia 11/09/2023   Dehydration 11/08/2023   Gastritis and gastroduodenitis 11/08/2023   Gastric polyp 11/08/2023   Nausea without vomiting 11/08/2023   Protein-calorie malnutrition, severe 11/08/2023   Decreased appetite 11/05/2023   Lower urinary tract infectious disease 11/01/2023   Chronic health problem 11/01/2023   Oral thrush 11/01/2023   FTT (failure to thrive) in adult 11/01/2023   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   Hot flashes  due to menopause 06/30/2021   Hyperlipidemia associated with type 2 diabetes mellitus (HCC) 11/24/2020   Diabetes mellitus without complication (HCC) 11/23/2020   Angioedema 12/10/2019   Lesion of mandible 12/10/2019   Seasonal allergies 11/20/2019   Essential hypertension 10/23/2019   Seizure disorder (HCC) 02/23/2016   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   Obesity (BMI 30.0-34.9) 10/13/2009   Infantile cerebral palsy (HCC) 10/11/2006    Orientation RESPIRATION BLADDER Height & Weight     Self, Place, Time  Normal Continent Weight: 158 lb 1.1 oz (71.7 kg) Height:  5' 5 (165.1 cm)  BEHAVIORAL SYMPTOMS/MOOD NEUROLOGICAL BOWEL NUTRITION STATUS      Continent Diet (Please see discharge summary)  AMBULATORY STATUS COMMUNICATION OF NEEDS Skin     Verbally Other (Comment) (WDL,Wound/Incision LDAs)                       Personal Care Assistance Level of Assistance              Functional Limitations Info  Sight, Hearing, Speech Sight Info: Adequate Hearing Info: Adequate Speech Info: Adequate    SPECIAL CARE FACTORS FREQUENCY                       Contractures Contractures Info: Not present    Additional Factors Info  Code Status, Allergies Code Status Info: FULL Allergies Info: NKA           Current Medications (05/06/2024):  This is the current hospital active medication list Current Facility-Administered Medications  Medication Dose  Route Frequency Provider Last Rate Last Admin   acetaminophen  (TYLENOL ) tablet 650 mg  650 mg Oral Q6H PRN Shona Terry SAILOR, DO   650 mg at 05/06/24 9673   clonazePAM  (KLONOPIN ) tablet 0.5 mg  0.5 mg Oral TID Cindy Garnette POUR, MD   0.5 mg at 05/06/24 1056   enoxaparin  (LOVENOX ) injection 40 mg  40 mg Subcutaneous Q24H Shona Terry SAILOR, DO   40 mg at 05/05/24 2116   lacosamide  (VIMPAT ) tablet 200 mg  200 mg Oral BID Cindy Garnette POUR, MD   200 mg at 05/06/24 1055    levETIRAcetam  (KEPPRA ) tablet 1,500 mg  1,500 mg Oral BID Cindy Garnette POUR, MD   1,500 mg at 05/06/24 1055   LORazepam  (ATIVAN ) injection 2 mg  2 mg Intravenous Q6H PRN Hall, Carole N, DO   2 mg at 05/03/24 0459   melatonin tablet 5 mg  5 mg Oral QHS PRN Shona Terry N, DO   5 mg at 05/05/24 2116   perampanel  (FYCOMPA ) tablet 4 mg  4 mg Oral Daily Nemecek, Alan, MD   4 mg at 05/06/24 1055   polyethylene glycol (MIRALAX  / GLYCOLAX ) packet 17 g  17 g Oral Daily PRN Shona Terry SAILOR, DO   17 g at 05/06/24 9673   prochlorperazine  (COMPAZINE ) injection 5 mg  5 mg Intravenous Q6H PRN Shona Terry SAILOR, DO         Discharge Medications: Please see discharge summary for a list of discharge medications.  Relevant Imaging Results:  Relevant Lab Results:   Additional Information [id number]  Isaiah Public, LCSWA

## 2024-05-06 NOTE — TOC PASRR Note (Signed)
 RE: Patricia White  Date of Birth: 07/25/1971  Date: 05/06/2024    To Whom It May Concern:   Please be advised that the above-named patient will require a short-term nursing home stay - anticipated 30 days or less for rehabilitation and strengthening. The plan is for return home.

## 2024-05-07 DIAGNOSIS — R2681 Unsteadiness on feet: Secondary | ICD-10-CM | POA: Diagnosis not present

## 2024-05-07 DIAGNOSIS — R1311 Dysphagia, oral phase: Secondary | ICD-10-CM | POA: Diagnosis not present

## 2024-05-07 DIAGNOSIS — R569 Unspecified convulsions: Secondary | ICD-10-CM | POA: Diagnosis not present

## 2024-05-07 DIAGNOSIS — Z7401 Bed confinement status: Secondary | ICD-10-CM | POA: Diagnosis not present

## 2024-05-07 DIAGNOSIS — M6281 Muscle weakness (generalized): Secondary | ICD-10-CM | POA: Diagnosis not present

## 2024-05-07 DIAGNOSIS — J302 Other seasonal allergic rhinitis: Secondary | ICD-10-CM | POA: Diagnosis not present

## 2024-05-07 DIAGNOSIS — E43 Unspecified severe protein-calorie malnutrition: Secondary | ICD-10-CM | POA: Diagnosis not present

## 2024-05-07 DIAGNOSIS — M79602 Pain in left arm: Secondary | ICD-10-CM | POA: Diagnosis not present

## 2024-05-07 DIAGNOSIS — K5900 Constipation, unspecified: Secondary | ICD-10-CM | POA: Diagnosis not present

## 2024-05-07 DIAGNOSIS — E038 Other specified hypothyroidism: Secondary | ICD-10-CM | POA: Diagnosis not present

## 2024-05-07 DIAGNOSIS — K219 Gastro-esophageal reflux disease without esophagitis: Secondary | ICD-10-CM | POA: Diagnosis not present

## 2024-05-07 DIAGNOSIS — Z741 Need for assistance with personal care: Secondary | ICD-10-CM | POA: Diagnosis not present

## 2024-05-07 DIAGNOSIS — Z743 Need for continuous supervision: Secondary | ICD-10-CM | POA: Diagnosis not present

## 2024-05-07 DIAGNOSIS — E119 Type 2 diabetes mellitus without complications: Secondary | ICD-10-CM | POA: Diagnosis not present

## 2024-05-07 DIAGNOSIS — R451 Restlessness and agitation: Secondary | ICD-10-CM | POA: Diagnosis not present

## 2024-05-07 DIAGNOSIS — E785 Hyperlipidemia, unspecified: Secondary | ICD-10-CM | POA: Diagnosis not present

## 2024-05-07 DIAGNOSIS — R293 Abnormal posture: Secondary | ICD-10-CM | POA: Diagnosis not present

## 2024-05-07 DIAGNOSIS — G808 Other cerebral palsy: Secondary | ICD-10-CM | POA: Diagnosis not present

## 2024-05-07 DIAGNOSIS — R531 Weakness: Secondary | ICD-10-CM | POA: Diagnosis not present

## 2024-05-07 DIAGNOSIS — R296 Repeated falls: Secondary | ICD-10-CM | POA: Diagnosis not present

## 2024-05-07 DIAGNOSIS — I10 Essential (primary) hypertension: Secondary | ICD-10-CM | POA: Diagnosis not present

## 2024-05-07 DIAGNOSIS — J45909 Unspecified asthma, uncomplicated: Secondary | ICD-10-CM | POA: Diagnosis not present

## 2024-05-07 DIAGNOSIS — E559 Vitamin D deficiency, unspecified: Secondary | ICD-10-CM | POA: Diagnosis not present

## 2024-05-07 LAB — BASIC METABOLIC PANEL WITH GFR
Anion gap: 10 (ref 5–15)
BUN: 5 mg/dL — ABNORMAL LOW (ref 6–20)
CO2: 26 mmol/L (ref 22–32)
Calcium: 8.8 mg/dL — ABNORMAL LOW (ref 8.9–10.3)
Chloride: 103 mmol/L (ref 98–111)
Creatinine, Ser: 0.67 mg/dL (ref 0.44–1.00)
GFR, Estimated: >60 mL/min (ref >60)
Glucose, Bld: 83 mg/dL (ref 70–99)
Potassium: 3.6 mmol/L (ref 3.5–5.1)
Sodium: 139 mmol/L (ref 135–145)

## 2024-05-07 LAB — PHENYTOIN LEVEL, TOTAL: Phenytoin Lvl: 20 ug/mL (ref 10.0–20.0)

## 2024-05-07 NOTE — Assessment & Plan Note (Signed)
 Asthma - not currently being medicated; asymptomatic

## 2024-05-07 NOTE — Assessment & Plan Note (Addendum)
 Mentation improved and appears at baseline. Patient interactive and responds verbally to questions. Phenytoin  level today is 20. - Hold phenytoin  for now, continue following dilantin  levels - Neurology consulted, appreciate recs  -Recommended once dilantin  level is <20 to resume 100 mg BID - Fall precautions

## 2024-05-07 NOTE — Progress Notes (Addendum)
 Report given to nurse morique at [facility]. All questions answered & call back number given as well. Jaremy Nosal R, RN   Pt's guardian nicloe draper updated of pt being discharged. Perris Conwell R, RN

## 2024-05-07 NOTE — Discharge Instructions (Addendum)
 Dear Mylinda JAYSON Haddock,  Thank you for letting us  participate in your care. You were hospitalized for AMS (altered mental status). We held your Phenytoin  since your level was elevated possibly contributing to your altered mentation this will be restarted at the SNF when your level is appropriate. EEG showed baseline abnormal results, and you were continued on her home Vimpat /Keppra /Perampanel  scheduled Klonopin .   POST-HOSPITAL & CARE INSTRUCTIONS Restart Phenytoin  as able Go to your follow up appointments (listed below)  DOCTOR'S APPOINTMENT   Future Appointments  Date Time Provider Department Center  05/21/2024 11:30 AM Penumalli, Eduard SAUNDERS, MD [facility] None  07/24/2024  2:10 PM [facility] ANNUAL WELLNESS VISIT [facility]    Take care and be well!  Family Medicine Teaching Service Inpatient Team [facility]  [address] [place], [place] [address] [phone]

## 2024-05-07 NOTE — Assessment & Plan Note (Signed)
 Repeat EEG appears at baseline. - Holding home phenytoin  as above - Continue home parempanel, vimpat , Keppra , scheduled klonopin  - Seizure precautions - IV Ativan  as needed for breakthrough seizures

## 2024-05-07 NOTE — TOC Progression Note (Signed)
 Transition of Care [facility]) - Progression Note    Patient Details  Name: Patricia White MRN: [id number] Date of Birth: 08-23-1970  Transition of Care [facility]) CM/SW Contact  Isaiah Public, LCSWA Phone Number: 05/07/2024, 9:48 AM  Clinical Narrative:     Patients passr has been approved [id number] E. CSW spoke with Tammy with [facility] who informed CSW that patient can dc over today if medically ready. CSW informed MD.                    Expected Discharge Plan and Services         Expected Discharge Date: 05/06/24                                     Social Drivers of Health (SDOH) Interventions SDOH Screenings   Food Insecurity: Patient Unable To Answer (05/03/2024)  Housing: Patient Unable To Answer (05/03/2024)  Transportation Needs: Patient Unable To Answer (05/03/2024)  Utilities: Patient Unable To Answer (05/03/2024)  Alcohol Screen: Low Risk  (03/24/2023)  Depression (PHQ2-9): Low Risk  (08/09/2023)  Financial Resource Strain: Low Risk  (03/24/2023)  Physical Activity: Insufficiently Active (03/24/2023)  Social Connections: Moderately Isolated (11/19/2023)  Stress: No Stress Concern Present (03/24/2023)  Tobacco Use: Low Risk  (05/02/2024)  Health Literacy: Adequate Health Literacy (03/24/2023)    Readmission Risk Interventions    11/16/2023   11:48 AM  Readmission Risk Prevention Plan  Transportation Screening Complete  PCP or Specialist Appt within 5-7 Days Complete  Home Care Screening Complete  Medication Review (RN CM) Complete

## 2024-05-07 NOTE — Plan of Care (Signed)
  Problem: Education: Goal: Knowledge of General Education information will improve Description: Including pain rating scale, medication(s)/side effects and non-pharmacologic comfort measures Outcome: Adequate for Discharge   Problem: Clinical Measurements: Goal: Ability to maintain clinical measurements within normal limits will improve Outcome: Adequate for Discharge Goal: Will remain free from infection Outcome: Adequate for Discharge Goal: Diagnostic test results will improve Outcome: Adequate for Discharge Goal: Respiratory complications will improve Outcome: Adequate for Discharge Goal: Cardiovascular complication will be avoided Outcome: Adequate for Discharge   Problem: Activity: Goal: Risk for activity intolerance will decrease Outcome: Adequate for Discharge   Problem: Coping: Goal: Level of anxiety will decrease Outcome: Adequate for Discharge   Problem: Pain Managment: Goal: General experience of comfort will improve and/or be controlled Outcome: Adequate for Discharge   Problem: Safety: Goal: Ability to remain free from injury will improve Outcome: Adequate for Discharge   Problem: Skin Integrity: Goal: Risk for impaired skin integrity will decrease Outcome: Adequate for Discharge

## 2024-05-07 NOTE — TOC Transition Note (Signed)
 Transition of Care [facility]) - Discharge Note   Patient Details  Name: Patricia White MRN: [id number] Date of Birth: August 29, 1970  [facility]) CM/SW Contact:  Isaiah Public, LCSWA Phone Number: 05/07/2024, 10:22 AM   Clinical Narrative:     Patient will DC to: [facility] SNF   Anticipated DC date: 05/07/2024  Family notified: Nat   Transport by: [facility]   ?  Per MD patient ready for DC to [facility]  . RN, patient, patient's family, and facility notified of DC. Discharge Summary sent to facility. RN given number for report [phone] RM# [phone]. DC packet on chart. Ambulance transport requested for patient.  CSW signing off.   Final next level of care: Skilled Nursing Facility Barriers to Discharge: No Barriers Identified   Patient Goals and CMS Choice     Choice offered to / list presented to :  (Patients responsible party Nat)      Discharge Placement              Patient chooses bed at:  [facility]) Patient to be transferred to facility by: [facility] Name of family member notified: Nat Patient and family notified of of transfer: 05/07/24  Discharge Plan and Services Additional resources added to the After Visit Summary for                                       Social Drivers of Health (SDOH) Interventions SDOH Screenings   Food Insecurity: Patient Unable To Answer (05/03/2024)  Housing: Patient Unable To Answer (05/03/2024)  Transportation Needs: Patient Unable To Answer (05/03/2024)  Utilities: Patient Unable To Answer (05/03/2024)  Alcohol Screen: Low Risk  (03/24/2023)  Depression (PHQ2-9): Low Risk  (08/09/2023)  Financial Resource Strain: Low Risk  (03/24/2023)  Physical Activity: Insufficiently Active (03/24/2023)  Social Connections: Moderately Isolated (11/19/2023)  Stress: No Stress Concern Present (03/24/2023)  Tobacco Use: Low Risk  (05/02/2024)  Health Literacy: Adequate Health Literacy (03/24/2023)      Readmission Risk Interventions    11/16/2023   11:48 AM  Readmission Risk Prevention Plan  Transportation Screening Complete  PCP or Specialist Appt within 5-7 Days Complete  Home Care Screening Complete  Medication Review (RN CM) Complete

## 2024-05-07 NOTE — Progress Notes (Signed)
     Daily Progress Note Intern Pager: [phone]  Patient name: Patricia White Medical record number: [id number] Date of birth: 02-Dec-1970 Age: 53 y.o. Gender: female  Primary Care Provider: Alba Sharper, MD Consultants: Neurology Code Status: FULL  Pt Overview and Major Events to Date:  9/19 - Admitted   Patricia White is a 53 year old female presenting with altered mental status, abdominal pain, nausea, and decrease in oral intake at her facility. AMS was most likely multifactorial related to elevated phenytoin  level >34 on admission, poor PO intake, and recent initiation of Seroquel. Pertinent PMH/PSH includes cerebral palsy, intellectual disability, seizure disorder, HTN, HLD, GERD, gastritis.  Assessment & Plan AMS (altered mental status) Elevated phenytoin  level Mentation improved and appears at baseline. Patient interactive and responds verbally to questions. Phenytoin  level today is 20. - Hold phenytoin  for now, continue following dilantin  levels - Neurology consulted, appreciate recs  -Recommended once dilantin  level is <20 to resume 100 mg BID - Fall precautions Seizure disorder (HCC) Repeat EEG appears at baseline. - Holding home phenytoin  as above - Continue home parempanel, vimpat , Keppra , scheduled klonopin  - Seizure precautions - IV Ativan  as needed for breakthrough seizures Chronic health problem Asthma - not currently being medicated; asymptomatic  FEN/GI: Regular diet PPx: Lovenox  Dispo:SNF pending authorization.  Subjective:  Patient seen at bedside watching TV. She was pleasant, interactive and verbally responsive to questions.  Objective: Temp:  [97.5 F (36.4 C)-98.1 F (36.7 C)] 98.1 F (36.7 C) (09/24 0300) Pulse Rate:  [86-94] 86 (09/24 0300) Resp:  [12-19] 12 (09/23 1647) BP: (101-131)/(72-99) 130/95 (09/24 0300) SpO2:  [96 %-100 %] 96 % (09/24 0300)  Physical Exam: General: alert, interactive, sitting up, NAD Cardiovascular: regular  rate and rhythm; normal S1/S2 Respiratory: clear to auscultation bilaterally; normal effort on RA Abdomen: normal bowel sounds; non-tender, soft, non-distended Extremities: warm and well-perfused; non-edematous  Laboratory: Most recent CBC Lab Results  Component Value Date   WBC 4.8 05/06/2024   HGB 9.6 (L) 05/06/2024   HCT 30.4 (L) 05/06/2024   MCV 83.1 05/06/2024   PLT 331 05/06/2024   Most recent BMP    Latest Ref Rng & Units 05/07/2024    5:45 AM  BMP  Glucose 70 - 99 mg/dL 83   BUN 6 - 20 mg/dL 5   Creatinine 9.55 - 8.99 mg/dL 9.32   Sodium 864 - 854 mmol/L 139   Potassium 3.5 - 5.1 mmol/L 3.6   Chloride 98 - 111 mmol/L 103   CO2 22 - 32 mmol/L 26   Calcium  8.9 - 10.3 mg/dL 8.8     Other pertinent labs: Phenytoin  level 20  Imaging/Diagnostic Tests: No new imaging  Mannie Ashley SAILOR, MD 05/07/2024, 8:12 AM  PGY-1, [facility] FPTS Intern pager: [phone], text pages welcome Secure chat group [facility] Teaching Service

## 2024-05-07 NOTE — Plan of Care (Signed)
  Problem: Education: Goal: Knowledge of General Education information will improve Description: Including pain rating scale, medication(s)/side effects and non-pharmacologic comfort measures 05/07/2024 1057 by Harvey Tresia SAUNDERS, RN Outcome: Adequate for Discharge 05/07/2024 1056 by Harvey Tresia SAUNDERS, RN Outcome: Adequate for Discharge   Problem: Health Behavior/Discharge Planning: Goal: Ability to manage health-related needs will improve 05/07/2024 1057 by Harvey Tresia SAUNDERS, RN Outcome: Adequate for Discharge 05/07/2024 1056 by Harvey Tresia SAUNDERS, RN Outcome: Adequate for Discharge   Problem: Clinical Measurements: Goal: Ability to maintain clinical measurements within normal limits will improve 05/07/2024 1057 by Harvey Tresia SAUNDERS, RN Outcome: Adequate for Discharge 05/07/2024 1056 by Harvey Tresia SAUNDERS, RN Outcome: Adequate for Discharge Goal: Will remain free from infection 05/07/2024 1057 by Harvey Tresia SAUNDERS, RN Outcome: Adequate for Discharge 05/07/2024 1056 by Harvey Tresia SAUNDERS, RN Outcome: Adequate for Discharge Goal: Diagnostic test results will improve 05/07/2024 1057 by Harvey Tresia SAUNDERS, RN Outcome: Adequate for Discharge 05/07/2024 1056 by Harvey Tresia SAUNDERS, RN Outcome: Adequate for Discharge Goal: Respiratory complications will improve 05/07/2024 1057 by Harvey Tresia SAUNDERS, RN Outcome: Adequate for Discharge 05/07/2024 1056 by Harvey Tresia SAUNDERS, RN Outcome: Adequate for Discharge Goal: Cardiovascular complication will be avoided 05/07/2024 1057 by Harvey Tresia SAUNDERS, RN Outcome: Adequate for Discharge 05/07/2024 1056 by Harvey Tresia SAUNDERS, RN Outcome: Adequate for Discharge   Problem: Activity: Goal: Risk for activity intolerance will decrease 05/07/2024 1057 by Harvey Tresia SAUNDERS, RN Outcome: Adequate for Discharge 05/07/2024 1056 by Harvey Tresia SAUNDERS, RN Outcome: Adequate for Discharge   Problem: Nutrition: Goal: Adequate nutrition will be maintained 05/07/2024 1057 by Harvey Tresia SAUNDERS, RN Outcome:  Adequate for Discharge 05/07/2024 1056 by Harvey Tresia SAUNDERS, RN Outcome: Adequate for Discharge   Problem: Coping: Goal: Level of anxiety will decrease 05/07/2024 1057 by Harvey Tresia SAUNDERS, RN Outcome: Adequate for Discharge 05/07/2024 1056 by Harvey Tresia SAUNDERS, RN Outcome: Adequate for Discharge   Problem: Elimination: Goal: Will not experience complications related to bowel motility 05/07/2024 1057 by Harvey Tresia SAUNDERS, RN Outcome: Adequate for Discharge 05/07/2024 1056 by Harvey Tresia SAUNDERS, RN Outcome: Adequate for Discharge Goal: Will not experience complications related to urinary retention 05/07/2024 1057 by Harvey Tresia SAUNDERS, RN Outcome: Adequate for Discharge 05/07/2024 1056 by Harvey Tresia SAUNDERS, RN Outcome: Adequate for Discharge   Problem: Pain Managment: Goal: General experience of comfort will improve and/or be controlled 05/07/2024 1057 by Harvey Tresia SAUNDERS, RN Outcome: Adequate for Discharge 05/07/2024 1056 by Harvey Tresia SAUNDERS, RN Outcome: Adequate for Discharge   Problem: Safety: Goal: Ability to remain free from injury will improve 05/07/2024 1057 by Harvey Tresia SAUNDERS, RN Outcome: Adequate for Discharge 05/07/2024 1056 by Harvey Tresia SAUNDERS, RN Outcome: Adequate for Discharge   Problem: Skin Integrity: Goal: Risk for impaired skin integrity will decrease 05/07/2024 1057 by Harvey Tresia SAUNDERS, RN Outcome: Adequate for Discharge 05/07/2024 1056 by Harvey Tresia SAUNDERS, RN Outcome: Adequate for Discharge

## 2024-05-08 ENCOUNTER — Other Ambulatory Visit: Payer: Self-pay | Admitting: Family Medicine

## 2024-05-08 DIAGNOSIS — M79602 Pain in left arm: Secondary | ICD-10-CM | POA: Diagnosis not present

## 2024-05-08 DIAGNOSIS — R296 Repeated falls: Secondary | ICD-10-CM | POA: Diagnosis not present

## 2024-05-08 DIAGNOSIS — E1169 Type 2 diabetes mellitus with other specified complication: Secondary | ICD-10-CM

## 2024-05-08 LAB — CULTURE, BLOOD (ROUTINE X 2)
Culture: NO GROWTH
Culture: NO GROWTH

## 2024-05-08 LAB — PHENYTOIN LEVEL, FREE AND TOTAL
Phenytoin, Free: 2.8 ug/mL — ABNORMAL HIGH (ref 1.0–2.0)
Phenytoin, Total: 31 ug/mL — ABNORMAL HIGH (ref 10.0–20.0)

## 2024-05-09 DIAGNOSIS — E119 Type 2 diabetes mellitus without complications: Secondary | ICD-10-CM | POA: Diagnosis not present

## 2024-05-09 DIAGNOSIS — E038 Other specified hypothyroidism: Secondary | ICD-10-CM | POA: Diagnosis not present

## 2024-05-09 DIAGNOSIS — J45909 Unspecified asthma, uncomplicated: Secondary | ICD-10-CM | POA: Diagnosis not present

## 2024-05-09 DIAGNOSIS — E559 Vitamin D deficiency, unspecified: Secondary | ICD-10-CM | POA: Diagnosis not present

## 2024-05-09 DIAGNOSIS — K219 Gastro-esophageal reflux disease without esophagitis: Secondary | ICD-10-CM | POA: Diagnosis not present

## 2024-05-09 DIAGNOSIS — E785 Hyperlipidemia, unspecified: Secondary | ICD-10-CM | POA: Diagnosis not present

## 2024-05-09 DIAGNOSIS — I10 Essential (primary) hypertension: Secondary | ICD-10-CM | POA: Diagnosis not present

## 2024-05-09 DIAGNOSIS — R451 Restlessness and agitation: Secondary | ICD-10-CM | POA: Diagnosis not present

## 2024-05-13 DIAGNOSIS — E038 Other specified hypothyroidism: Secondary | ICD-10-CM | POA: Diagnosis not present

## 2024-05-13 DIAGNOSIS — J45909 Unspecified asthma, uncomplicated: Secondary | ICD-10-CM | POA: Diagnosis not present

## 2024-05-13 DIAGNOSIS — I10 Essential (primary) hypertension: Secondary | ICD-10-CM | POA: Diagnosis not present

## 2024-05-13 DIAGNOSIS — K219 Gastro-esophageal reflux disease without esophagitis: Secondary | ICD-10-CM | POA: Diagnosis not present

## 2024-05-13 DIAGNOSIS — R451 Restlessness and agitation: Secondary | ICD-10-CM | POA: Diagnosis not present

## 2024-05-17 DIAGNOSIS — E860 Dehydration: Secondary | ICD-10-CM | POA: Diagnosis not present

## 2024-05-20 DIAGNOSIS — I10 Essential (primary) hypertension: Secondary | ICD-10-CM | POA: Diagnosis not present

## 2024-05-20 DIAGNOSIS — K219 Gastro-esophageal reflux disease without esophagitis: Secondary | ICD-10-CM | POA: Diagnosis not present

## 2024-05-20 DIAGNOSIS — J45909 Unspecified asthma, uncomplicated: Secondary | ICD-10-CM | POA: Diagnosis not present

## 2024-05-20 DIAGNOSIS — R451 Restlessness and agitation: Secondary | ICD-10-CM | POA: Diagnosis not present

## 2024-05-21 ENCOUNTER — Encounter: Payer: Self-pay | Admitting: Diagnostic Neuroimaging

## 2024-05-21 ENCOUNTER — Institutional Professional Consult (permissible substitution): Admitting: Diagnostic Neuroimaging

## 2024-05-21 DIAGNOSIS — R569 Unspecified convulsions: Secondary | ICD-10-CM | POA: Diagnosis not present

## 2024-05-27 DIAGNOSIS — J45909 Unspecified asthma, uncomplicated: Secondary | ICD-10-CM | POA: Diagnosis not present

## 2024-05-27 DIAGNOSIS — K219 Gastro-esophageal reflux disease without esophagitis: Secondary | ICD-10-CM | POA: Diagnosis not present

## 2024-05-27 DIAGNOSIS — I10 Essential (primary) hypertension: Secondary | ICD-10-CM | POA: Diagnosis not present

## 2024-05-27 DIAGNOSIS — R451 Restlessness and agitation: Secondary | ICD-10-CM | POA: Diagnosis not present

## 2024-06-02 DIAGNOSIS — I10 Essential (primary) hypertension: Secondary | ICD-10-CM | POA: Diagnosis not present

## 2024-06-02 DIAGNOSIS — R451 Restlessness and agitation: Secondary | ICD-10-CM | POA: Diagnosis not present

## 2024-06-02 DIAGNOSIS — J45909 Unspecified asthma, uncomplicated: Secondary | ICD-10-CM | POA: Diagnosis not present

## 2024-06-02 DIAGNOSIS — K219 Gastro-esophageal reflux disease without esophagitis: Secondary | ICD-10-CM | POA: Diagnosis not present

## 2024-06-05 DIAGNOSIS — R296 Repeated falls: Secondary | ICD-10-CM | POA: Diagnosis not present

## 2024-06-08 DIAGNOSIS — R296 Repeated falls: Secondary | ICD-10-CM | POA: Diagnosis not present

## 2024-06-09 DIAGNOSIS — I10 Essential (primary) hypertension: Secondary | ICD-10-CM | POA: Diagnosis not present

## 2024-06-09 DIAGNOSIS — J45909 Unspecified asthma, uncomplicated: Secondary | ICD-10-CM | POA: Diagnosis not present

## 2024-06-09 DIAGNOSIS — G809 Cerebral palsy, unspecified: Secondary | ICD-10-CM | POA: Diagnosis not present

## 2024-06-09 DIAGNOSIS — R296 Repeated falls: Secondary | ICD-10-CM | POA: Diagnosis not present

## 2024-06-09 DIAGNOSIS — R451 Restlessness and agitation: Secondary | ICD-10-CM | POA: Diagnosis not present

## 2024-06-09 DIAGNOSIS — K219 Gastro-esophageal reflux disease without esophagitis: Secondary | ICD-10-CM | POA: Diagnosis not present

## 2024-06-09 DIAGNOSIS — R110 Nausea: Secondary | ICD-10-CM | POA: Diagnosis not present

## 2024-06-12 DIAGNOSIS — K219 Gastro-esophageal reflux disease without esophagitis: Secondary | ICD-10-CM | POA: Diagnosis not present

## 2024-06-12 DIAGNOSIS — R110 Nausea: Secondary | ICD-10-CM | POA: Diagnosis not present

## 2024-06-12 DIAGNOSIS — I10 Essential (primary) hypertension: Secondary | ICD-10-CM | POA: Diagnosis not present

## 2024-06-12 DIAGNOSIS — J45909 Unspecified asthma, uncomplicated: Secondary | ICD-10-CM | POA: Diagnosis not present

## 2024-06-12 DIAGNOSIS — R451 Restlessness and agitation: Secondary | ICD-10-CM | POA: Diagnosis not present

## 2024-06-14 ENCOUNTER — Other Ambulatory Visit: Payer: Self-pay | Admitting: Student

## 2024-06-29 ENCOUNTER — Emergency Department (HOSPITAL_COMMUNITY)

## 2024-06-29 ENCOUNTER — Emergency Department (HOSPITAL_COMMUNITY)
Admission: EM | Admit: 2024-06-29 | Discharge: 2024-06-29 | Disposition: A | Discharge status: Home / Self Care | Attending: Emergency Medicine | Admitting: Emergency Medicine

## 2024-06-29 ENCOUNTER — Other Ambulatory Visit: Payer: Self-pay

## 2024-06-29 DIAGNOSIS — Y92002 Bathroom of unspecified non-institutional (private) residence single-family (private) house as the place of occurrence of the external cause: Secondary | ICD-10-CM | POA: Diagnosis not present

## 2024-06-29 DIAGNOSIS — Z8669 Personal history of other diseases of the nervous system and sense organs: Secondary | ICD-10-CM | POA: Diagnosis not present

## 2024-06-29 DIAGNOSIS — W07XXXA Fall from chair, initial encounter: Secondary | ICD-10-CM | POA: Diagnosis not present

## 2024-06-29 DIAGNOSIS — W19XXXA Unspecified fall, initial encounter: Secondary | ICD-10-CM

## 2024-06-29 DIAGNOSIS — S0990XA Unspecified injury of head, initial encounter: Secondary | ICD-10-CM | POA: Insufficient documentation

## 2024-06-29 NOTE — Discharge Instructions (Signed)
 You were seen in the emerged ferment after a fall The CAT scan of your head neck chest x-ray and pelvis x-ray looked okay There is no evidence of traumatic injury Please follow-up with your primary doctor in 1 week for reevaluation Return to the emergency department for severe pain repeated falls or any other concerns

## 2024-06-29 NOTE — ED Provider Notes (Signed)
 [place] EMERGENCY DEPARTMENT AT [facility] Provider Note   CSN: [id number] Arrival date & time: 06/29/24  1311     Patient presents with: Patricia White is a 53 y.o. female.  With a history of cerebral palsy intellectual disability and seizure disorder on Keppra  Vimpat  Dilantin  who presents to the ED after a fall.  Patient is a resident of [facility] nursing facility where she suffered an unwitnessed fall today.  Patient states she was attempting to use the bathroom when she fell out of her wheelchair.  She was found on the ground by nursing staff.  Remains at cognitive baseline.  No significant injuries or trauma appreciated.  Patient resting comfortably without specific complaints at this time.  No reported seizure activity    Fall       Prior to Admission medications   Medication Sig Start Date End Date Taking? Authorizing Provider  acetaminophen  (TYLENOL ) 325 MG tablet Take 2 tablets (650 mg total) by mouth every 6 (six) hours as needed for mild pain (pain score 1-3) or fever. 02/20/24  Yes Alba Sharper, MD  clonazePAM  (KLONOPIN ) 0.5 MG tablet Take 1 tablet (0.5 mg total) by mouth 3 (three) times daily. 05/06/24  Yes Mabe, Elna, MD  lacosamide  (VIMPAT ) 200 MG TABS tablet Take 1 tablet (200 mg total) by mouth 2 (two) times daily. 05/06/24  Yes Mabe, Elna, MD  levETIRAcetam  (KEPPRA ) 750 MG tablet Take 2 tablets (1,500 mg total) by mouth 2 (two) times daily. 11/26/23  Yes Quillen, Wynnie Pacetti, MD  mirtazapine (REMERON) 15 MG tablet Take 15 mg by mouth at bedtime.   Yes [provider]  ondansetron  (ZOFRAN ) 4 MG tablet Take 4 mg by mouth every 6 (six) hours as needed for nausea or vomiting.   Yes [provider]  pantoprazole  (PROTONIX ) 40 MG tablet Take 1 tablet (40 mg total) by mouth daily. 02/20/24  Yes Alba Sharper, MD  Perampanel  4 MG TABS Take 1 tablet (4 mg total) by mouth daily. 05/06/24  Yes Mabe, Elna, MD  phenytoin  (DILANTIN )  100 MG ER capsule Take 100 mg by mouth 2 (two) times daily.   Yes [provider]  polyethylene glycol (MIRALAX  / GLYCOLAX ) 17 g packet Take 17 g by mouth daily. 02/20/24  Yes Alba Sharper, MD  rosuvastatin  (CRESTOR ) 20 MG tablet Take 1 tablet (20 mg total) by mouth daily. Patient taking differently: Take 20 mg by mouth at bedtime. 02/20/24  Yes Alba Sharper, MD  senna-docusate (SENOKOT-S) 8.6-50 MG tablet Take 1 tablet by mouth daily. 02/20/24  Yes Alba Sharper, MD  Vitamin D, Ergocalciferol, (DRISDOL) 1.25 MG (50000 UNIT) CAPS capsule Take 50,000 Units by mouth every 7 (seven) days. Monday   Yes [provider]  indapamide  (LOZOL ) 1.25 MG tablet Take 1.25 mg by mouth daily. Patient not taking: Reported on 05/03/2024    [provider]  losartan  (COZAAR ) 100 MG tablet Take 50 mg by mouth at bedtime. Patient not taking: Reported on 05/03/2024 01/05/24   [provider]  QUEtiapine (SEROQUEL) 25 MG tablet Take 25 mg by mouth 2 (two) times daily. Patient not taking: Reported on 06/29/2024 04/29/24   [provider]    Allergies: Patient has no known allergies.    Review of Systems  Updated Vital Signs BP 116/84 (BP Location: Right Arm)   Pulse 96   Temp (!) 97.5 F (36.4 C) (Oral) Comment: Difficulty obtaining temperature because pt would not keep mouth closed and leaned  her head toward the probe.  Resp 16   Ht 5' 5 (1.651 m)   Wt 73 kg   SpO2 97%   BMI 26.78 kg/m   Physical Exam Vitals and nursing note reviewed.  HENT:     Head: Normocephalic and atraumatic.  Eyes:     Pupils: Pupils are equal, round, and reactive to light.  Cardiovascular:     Rate and Rhythm: Normal rate and regular rhythm.  Pulmonary:     Effort: Pulmonary effort is normal.     Breath sounds: Normal breath sounds.  Abdominal:     Palpations: Abdomen is soft.     Tenderness: There is no abdominal tenderness.  Musculoskeletal:     Cervical back: Neck supple.  No tenderness.     Comments: No tenderness deformity or range of motion restriction in upper or lower extremities Baseline atrophy with mild contracture of lower extremities No midline tenderness step-off deformity of back  Skin:    General: Skin is warm and dry.  Neurological:     Mental Status: She is alert.  Psychiatric:        Mood and Affect: Mood normal.     (all labs ordered are listed, but only abnormal results are displayed) Labs Reviewed - No data to display  EKG: None  Radiology: DG Pelvis 1-2 Views Result Date: 06/29/2024 CLINICAL DATA:  Fall.  Pelvic pain. EXAM: PELVIS - 1-2 VIEW COMPARISON:  None Available. FINDINGS: There is no evidence of pelvic fracture or diastasis. No pelvic bone lesions are seen. IMPRESSION: Negative. Electronically Signed   By: Norleen DELENA Kil M.D.   On: 06/29/2024 15:45   DG Chest 2 View Result Date: 06/29/2024 CLINICAL DATA:  Fall. EXAM: CHEST - 2 VIEW COMPARISON:  05/02/2024 FINDINGS: Heart size is within normal limits. Low lung volumes are noted with mild bibasilar atelectasis. Mild elevation of left hemidiaphragm again seen, with gaseous distention of stomach noted. No evidence of pulmonary consolidation or edema. No pleural effusion. IMPRESSION: Low lung volumes with mild bibasilar atelectasis. Electronically Signed   By: Norleen DELENA Kil M.D.   On: 06/29/2024 15:44   CT Head Wo Contrast Result Date: 06/29/2024 EXAM: CT HEAD WITHOUT CONTRAST 06/29/2024 02:45:52 PM TECHNIQUE: CT of the head was performed without the administration of intravenous contrast. Automated exposure control, iterative reconstruction, and/or weight based adjustment of the mA/kV was utilized to reduce the radiation dose to as low as reasonably achievable. COMPARISON: None available. CLINICAL HISTORY: Head trauma, moderate-severe FINDINGS: BRAIN AND VENTRICLES: No acute hemorrhage. No evidence of acute infarct. No hydrocephalus. No extra-axial collection. No mass effect or  midline shift. ORBITS: No acute abnormality. SINUSES: No acute abnormality. SOFT TISSUES AND SKULL: No acute soft tissue abnormality. No skull fracture. IMPRESSION: 1. No acute intracranial abnormality. Electronically signed by: Lonni Necessary MD 06/29/2024 02:58 PM EST RP Workstation: [id number]   CT Cervical Spine Wo Contrast Result Date: 06/29/2024 CLINICAL DATA:  Neck trauma, dangerous injury mechanism (Age 66-64y) Unwitnessed fall.  Found down. EXAM: CT CERVICAL SPINE WITHOUT CONTRAST TECHNIQUE: Multidetector CT imaging of the cervical spine was performed without intravenous contrast. Multiplanar CT image reconstructions were also generated. RADIATION DOSE REDUCTION: This exam was performed according to the departmental dose-optimization program which includes automated exposure control, adjustment of the mA and/or kV according to patient size and/or use of iterative reconstruction technique. COMPARISON:  CT cervical spine 02/05/2024. FINDINGS: Alignment: Straightening without focal angulation or listhesis. Skull base and vertebrae: No evidence of acute fracture or traumatic subluxation.  Soft tissues and spinal canal: No prevertebral fluid or swelling. No visible canal hematoma. Disc levels: The disc spaces are preserved. No evidence of large disc herniation, significant spinal stenosis or significant foraminal narrowing. There are prominent anterior osteophytes at C5-6 and C6-7. Upper chest: Clear lung apices. Other: None. IMPRESSION: 1. No evidence of acute cervical spine fracture, traumatic subluxation or static signs of instability. 2. Stable changes of DISH. Electronically Signed   By: Elsie Perone M.D.   On: 06/29/2024 14:54     Procedures   Medications Ordered in the ED - No data to display  Clinical Course as of 06/29/24 1610  Sun Jun 29, 2024  1609 No acute traumatic findings on imaging.  Appropriate for discharge back to facility at this time. [MP]    Clinical Course User  Index [MP] Pamella Ozell LABOR, DO                                 Medical Decision Making 53 year old female with history as above presenting to the ED after a fall at skilled nursing facility.  Reportedly fell out of her chair onto the floor.  This was unwitnessed.  No reported seizure activity.  No neurologic baseline now.  No external evidence of trauma but given history of unwitnessed fall will obtain CT head C-spine and chest x-ray pelvis x-ray to evaluate for traumatic injury.  Normotensive afebrile.  No indication for laboratory workup at this time.  Amount and/or Complexity of Data Reviewed Radiology: ordered.        Final diagnoses:  Fall, initial encounter  History of cerebral palsy    ED Discharge Orders     None          Pamella Ozell LABOR, DO 06/29/24 1610

## 2024-06-29 NOTE — ED Triage Notes (Signed)
 Patient to ED by EMS from [facility] for unwitnessed fall. Per facility patient was found laying down they put her back in a chair prior to EMS arriving. Patient is only alert to self and is intellectually delayed. Legal Guardian contacted upon arrival.

## 2024-06-29 NOTE — ED Notes (Signed)
 Patient transported to CT

## 2024-07-04 ENCOUNTER — Encounter: Payer: Self-pay | Admitting: Pharmacist

## 2024-07-04 NOTE — Progress Notes (Signed)
 This patient is appearing on a report for being at risk of failing the adherence measure for cholesterol (statin) medications this calendar year.   Medication: Rosuvastatin  20 mg Last fill date: 10/30 for 30 day supply

## 2024-07-24 ENCOUNTER — Encounter

## 2024-07-31 ENCOUNTER — Inpatient Hospital Stay (HOSPITAL_COMMUNITY)
Admission: EM | Admit: 2024-07-31 | Discharge: 2024-08-04 | DRG: 071 | Disposition: A | Discharge status: Skilled Nursing Facility | Source: Skilled Nursing Facility | Attending: Internal Medicine | Admitting: Internal Medicine

## 2024-07-31 ENCOUNTER — Emergency Department (HOSPITAL_COMMUNITY)

## 2024-07-31 ENCOUNTER — Other Ambulatory Visit: Payer: Self-pay

## 2024-07-31 ENCOUNTER — Encounter (HOSPITAL_COMMUNITY): Payer: Self-pay | Admitting: Family Medicine

## 2024-07-31 DIAGNOSIS — Z8249 Family history of ischemic heart disease and other diseases of the circulatory system: Secondary | ICD-10-CM

## 2024-07-31 DIAGNOSIS — G40909 Epilepsy, unspecified, not intractable, without status epilepticus: Secondary | ICD-10-CM | POA: Diagnosis not present

## 2024-07-31 DIAGNOSIS — G808 Other cerebral palsy: Secondary | ICD-10-CM | POA: Diagnosis not present

## 2024-07-31 DIAGNOSIS — G9340 Encephalopathy, unspecified: Secondary | ICD-10-CM | POA: Diagnosis present

## 2024-07-31 DIAGNOSIS — R339 Retention of urine, unspecified: Secondary | ICD-10-CM | POA: Diagnosis present

## 2024-07-31 DIAGNOSIS — R6510 Systemic inflammatory response syndrome (SIRS) of non-infectious origin without acute organ dysfunction: Secondary | ICD-10-CM | POA: Diagnosis present

## 2024-07-31 DIAGNOSIS — E860 Dehydration: Secondary | ICD-10-CM | POA: Diagnosis present

## 2024-07-31 DIAGNOSIS — G40901 Epilepsy, unspecified, not intractable, with status epilepticus: Secondary | ICD-10-CM | POA: Diagnosis present

## 2024-07-31 DIAGNOSIS — J45909 Unspecified asthma, uncomplicated: Secondary | ICD-10-CM | POA: Diagnosis present

## 2024-07-31 DIAGNOSIS — R680 Hypothermia, not associated with low environmental temperature: Secondary | ICD-10-CM | POA: Diagnosis present

## 2024-07-31 DIAGNOSIS — Z1152 Encounter for screening for COVID-19: Secondary | ICD-10-CM

## 2024-07-31 DIAGNOSIS — R946 Abnormal results of thyroid function studies: Secondary | ICD-10-CM | POA: Diagnosis present

## 2024-07-31 DIAGNOSIS — G809 Cerebral palsy, unspecified: Secondary | ICD-10-CM | POA: Diagnosis present

## 2024-07-31 DIAGNOSIS — R338 Other retention of urine: Secondary | ICD-10-CM

## 2024-07-31 DIAGNOSIS — E869 Volume depletion, unspecified: Secondary | ICD-10-CM | POA: Diagnosis present

## 2024-07-31 DIAGNOSIS — T68XXXA Hypothermia, initial encounter: Secondary | ICD-10-CM | POA: Diagnosis present

## 2024-07-31 DIAGNOSIS — D72829 Elevated white blood cell count, unspecified: Secondary | ICD-10-CM | POA: Diagnosis present

## 2024-07-31 DIAGNOSIS — F71 Moderate intellectual disabilities: Secondary | ICD-10-CM | POA: Diagnosis present

## 2024-07-31 DIAGNOSIS — R9431 Abnormal electrocardiogram [ECG] [EKG]: Secondary | ICD-10-CM | POA: Diagnosis present

## 2024-07-31 DIAGNOSIS — G9341 Metabolic encephalopathy: Principal | ICD-10-CM | POA: Diagnosis present

## 2024-07-31 DIAGNOSIS — R400 Somnolence: Principal | ICD-10-CM

## 2024-07-31 DIAGNOSIS — E785 Hyperlipidemia, unspecified: Secondary | ICD-10-CM | POA: Diagnosis present

## 2024-07-31 DIAGNOSIS — Z79899 Other long term (current) drug therapy: Secondary | ICD-10-CM

## 2024-07-31 LAB — BLOOD GAS, VENOUS
Acid-base deficit: 0.5 mmol/L (ref 0.0–2.0)
Bicarbonate: 25.4 mmol/L (ref 20.0–28.0)
O2 Saturation: 94.7 %
Patient temperature: 37
pCO2, Ven: 46 mmHg (ref 44–60)
pH, Ven: 7.35 (ref 7.25–7.43)
pO2, Ven: 65 mmHg — ABNORMAL HIGH (ref 32–45)

## 2024-07-31 LAB — URINALYSIS, ROUTINE W REFLEX MICROSCOPIC
Bacteria, UA: NONE SEEN
Bilirubin Urine: NEGATIVE
Glucose, UA: NEGATIVE mg/dL
Hgb urine dipstick: NEGATIVE
Ketones, ur: 80 mg/dL — AB
Leukocytes,Ua: NEGATIVE
Nitrite: NEGATIVE
Protein, ur: 30 mg/dL — AB
Specific Gravity, Urine: 1.024 (ref 1.005–1.030)
pH: 5 (ref 5.0–8.0)

## 2024-07-31 LAB — PROTIME-INR
INR: 1 (ref 0.8–1.2)
Prothrombin Time: 14.2 s (ref 11.4–15.2)

## 2024-07-31 LAB — CBC WITH DIFFERENTIAL/PLATELET
Abs Immature Granulocytes: 0.04 K/uL (ref 0.00–0.07)
Basophils Absolute: 0 K/uL (ref 0.0–0.1)
Basophils Relative: 0 %
Eosinophils Absolute: 0.1 K/uL (ref 0.0–0.5)
Eosinophils Relative: 1 %
HCT: 37.7 % (ref 36.0–46.0)
Hemoglobin: 11.6 g/dL — ABNORMAL LOW (ref 12.0–15.0)
Immature Granulocytes: 0 %
Lymphocytes Relative: 7 %
Lymphs Abs: 0.8 K/uL (ref 0.7–4.0)
MCH: 26.4 pg (ref 26.0–34.0)
MCHC: 30.8 g/dL (ref 30.0–36.0)
MCV: 85.7 fL (ref 80.0–100.0)
Monocytes Absolute: 0.4 K/uL (ref 0.1–1.0)
Monocytes Relative: 3 %
Neutro Abs: 10.7 K/uL — ABNORMAL HIGH (ref 1.7–7.7)
Neutrophils Relative %: 89 %
Platelets: 184 K/uL (ref 150–400)
RBC: 4.4 MIL/uL (ref 3.87–5.11)
RDW: 17.8 % — ABNORMAL HIGH (ref 11.5–15.5)
WBC: 12 K/uL — ABNORMAL HIGH (ref 4.0–10.5)
nRBC: 0.3 % — ABNORMAL HIGH (ref 0.0–0.2)

## 2024-07-31 LAB — RESP PANEL BY RT-PCR (RSV, FLU A&B, COVID)  RVPGX2
Influenza A by PCR: NEGATIVE
Influenza B by PCR: NEGATIVE
Resp Syncytial Virus by PCR: NEGATIVE
SARS Coronavirus 2 by RT PCR: NEGATIVE

## 2024-07-31 LAB — CBG MONITORING, ED: Glucose-Capillary: 99 mg/dL (ref 70–99)

## 2024-07-31 LAB — PHENYTOIN LEVEL, TOTAL: Phenytoin Lvl: 20.6 ug/mL — ABNORMAL HIGH (ref 10.0–20.0)

## 2024-07-31 LAB — COMPREHENSIVE METABOLIC PANEL WITH GFR
ALT: 41 U/L (ref 0–44)
AST: 39 U/L (ref 15–41)
Albumin: 4.1 g/dL (ref 3.5–5.0)
Alkaline Phosphatase: 222 U/L — ABNORMAL HIGH (ref 38–126)
Anion gap: 16 — ABNORMAL HIGH (ref 5–15)
BUN: 15 mg/dL (ref 6–20)
CO2: 24 mmol/L (ref 22–32)
Calcium: 10.2 mg/dL (ref 8.9–10.3)
Chloride: 105 mmol/L (ref 98–111)
Creatinine, Ser: 0.66 mg/dL (ref 0.44–1.00)
GFR, Estimated: >60 mL/min (ref >60)
Glucose, Bld: 105 mg/dL — ABNORMAL HIGH (ref 70–99)
Potassium: 4.5 mmol/L (ref 3.5–5.1)
Sodium: 145 mmol/L (ref 135–145)
Total Bilirubin: 0.4 mg/dL (ref 0.0–1.2)
Total Protein: 9.4 g/dL — ABNORMAL HIGH (ref 6.5–8.1)

## 2024-07-31 LAB — TSH: TSH: 1.96 u[IU]/mL (ref 0.350–4.500)

## 2024-07-31 LAB — I-STAT CG4 LACTIC ACID, ED
Lactic Acid, Venous: 1.7 mmol/L (ref 0.5–1.9)
Lactic Acid, Venous: 1.3 mmol/L (ref 0.5–1.9)

## 2024-07-31 LAB — PROCALCITONIN: Procalcitonin: <0.1 ng/mL

## 2024-07-31 MED ORDER — CLONAZEPAM 0.5 MG PO TABS: 0.5000 mg | ORAL_TABLET | Freq: Three times a day (TID) | ORAL | Status: DC

## 2024-07-31 MED ORDER — SENNOSIDES-DOCUSATE SODIUM 8.6-50 MG PO TABS: 1.0000 | ORAL_TABLET | Freq: Every day | ORAL | Status: DC

## 2024-07-31 MED ORDER — POLYETHYLENE GLYCOL 3350 17 G PO PACK: 17.0000 g | PACK | Freq: Every day | ORAL | Status: DC

## 2024-07-31 MED ORDER — PHENYTOIN SODIUM 50 MG/ML IJ SOLN: 100.0000 mg | Freq: Two times a day (BID) | INTRAMUSCULAR | Status: DC

## 2024-07-31 MED ORDER — PERAMPANEL 4 MG PO TABS
4.0000 mg | ORAL_TABLET | Freq: Every day | ORAL | Status: DC
  Administered 2024-08-02 – 2024-08-04 (×3): 4 mg via ORAL
  Filled 2024-07-31 (×2): qty 2
  Filled 2024-07-31 (×2): qty 1
  Filled 2024-07-31 (×2): qty 2

## 2024-07-31 MED ORDER — ACETAMINOPHEN 325 MG PO TABS
650.0000 mg | ORAL_TABLET | Freq: Four times a day (QID) | ORAL | Status: DC | PRN
  Administered 2024-08-03: 650 mg via ORAL
  Filled 2024-07-31: qty 2

## 2024-07-31 MED ORDER — SODIUM CHLORIDE 0.9 % IV SOLN: INTRAVENOUS | Status: AC

## 2024-07-31 MED ORDER — SODIUM CHLORIDE 0.9% FLUSH
3.0000 mL | Freq: Two times a day (BID) | INTRAVENOUS | Status: DC
  Administered 2024-07-31 – 2024-08-04 (×8): 3 mL via INTRAVENOUS

## 2024-07-31 MED ORDER — PERAMPANEL 2 MG PO TABS: 2.0000 mg | ORAL_TABLET | Freq: Every day | ORAL | Status: DC

## 2024-07-31 MED ORDER — LACTATED RINGERS IV BOLUS
1000.0000 mL | Freq: Once | INTRAVENOUS | Status: AC
  Administered 2024-07-31: 21:00:00 1000 mL via INTRAVENOUS

## 2024-07-31 MED ORDER — PANTOPRAZOLE SODIUM 40 MG PO TBEC: 40.0000 mg | DELAYED_RELEASE_TABLET | Freq: Every day | ORAL | Status: DC

## 2024-07-31 MED ORDER — ACETAMINOPHEN 650 MG RE SUPP
650.0000 mg | Freq: Four times a day (QID) | RECTAL | Status: DC | PRN
  Administered 2024-08-02: 650 mg via RECTAL
  Filled 2024-07-31: qty 1

## 2024-07-31 MED ORDER — LACTATED RINGERS IV BOLUS
1000.0000 mL | Freq: Once | INTRAVENOUS | Status: AC
  Administered 2024-07-31: 16:00:00 1000 mL via INTRAVENOUS

## 2024-07-31 MED ORDER — LEVETIRACETAM (KEPPRA) 500 MG/5 ML ADULT IV PUSH
1500.0000 mg | Freq: Every day | INTRAVENOUS | Status: DC
  Administered 2024-07-31 – 2024-08-02 (×3): 1500 mg via INTRAVENOUS
  Filled 2024-07-31 (×4): qty 15

## 2024-07-31 MED ORDER — SODIUM CHLORIDE 0.9 % IV SOLN
200.0000 mg | Freq: Two times a day (BID) | INTRAVENOUS | Status: DC
  Administered 2024-07-31 – 2024-08-02 (×5): 200 mg via INTRAVENOUS
  Filled 2024-07-31 (×6): qty 20

## 2024-07-31 MED ORDER — ROSUVASTATIN CALCIUM 20 MG PO TABS: 20.0000 mg | ORAL_TABLET | Freq: Every day | ORAL | Status: DC

## 2024-07-31 MED ORDER — NALOXONE HCL 2 MG/2ML IJ SOSY
PREFILLED_SYRINGE | INTRAMUSCULAR | Status: AC
  Administered 2024-07-31: 15:00:00 4 mg via NASAL
  Filled 2024-07-31: qty 2

## 2024-07-31 MED ORDER — SODIUM CHLORIDE 0.9 % IV SOLN
2.0000 g | Freq: Once | INTRAVENOUS | Status: AC
  Administered 2024-07-31: 16:00:00 2 g via INTRAVENOUS
  Filled 2024-07-31: qty 12.5

## 2024-07-31 MED ORDER — LEVETIRACETAM (KEPPRA) 500 MG/5 ML ADULT IV PUSH
1000.0000 mg | Freq: Every morning | INTRAVENOUS | Status: DC
  Administered 2024-08-01 – 2024-08-02 (×2): 1000 mg via INTRAVENOUS
  Filled 2024-07-31 (×3): qty 10

## 2024-07-31 MED ORDER — ENOXAPARIN SODIUM 40 MG/0.4ML IJ SOSY
40.0000 mg | PREFILLED_SYRINGE | INTRAMUSCULAR | Status: DC
  Administered 2024-08-01 – 2024-08-04 (×4): 40 mg via SUBCUTANEOUS
  Filled 2024-07-31 (×4): qty 0.4

## 2024-07-31 MED ORDER — NALOXONE HCL 4 MG/0.1ML NA LIQD
NASAL | Status: AC
  Filled 2024-07-31: qty 4

## 2024-07-31 MED ORDER — NYSTATIN 100000 UNIT/GM EX POWD
1.0000 | CUTANEOUS | Status: DC
  Administered 2024-08-01 – 2024-08-04 (×7): 1 via TOPICAL
  Filled 2024-07-31 (×2): qty 15

## 2024-07-31 NOTE — ED Triage Notes (Signed)
 Pt arrived via EMS, AMS x2 days per staff. Staff at [facility] unsure of baseline mentation. Poor PO intake x2 days. EMS states only responding to tactile stimuli.  Per previous notes, pt has baseline cognitive impairment. Alert and oriented to self only.

## 2024-07-31 NOTE — H&P (Addendum)
 History and Physical    Patricia White FMW:[id number] DOB: 25-Aug-1970 DOA: 07/31/2024  PCP: Alba Sharper, MD   Patient coming from: SNF   Chief Complaint: AMS   HPI: Patricia White is a 53 y.o. female with medical history significant for cerebral palsy, intellectual disability, seizure disorder, and hyperlipidemia who is sent to the ED from her nursing facility for altered mental status.  Patient is reportedly not eating or drinking much and has had decreased level of consciousness for the past 2 days.  At her baseline, she is said to be mostly nonverbal but alert and interactive.  ED Course: Upon arrival to the ED, patient is found to be hypothermic with core temp 33.8 C and saturating well on room air with normal RR, normal HR initially, and stable BP.  Labs are most notable for normal BUN and creatinine, alkaline phosphatase 222, WBC 12,000, normal lactic acid, normal pCO2, and negative respiratory virus panel.  There are no acute findings on chest x-ray or on head CT.  Blood culture was collected in the ED, warming blanket was applied, and the patient was given a dose of cefepime .  Review of Systems:  ROS limited by patient's clinical condition.  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 She has never used smokeless tobacco. She reports that she does not drink alcohol and does not use drugs.  Allergies[1]  Family  History  Problem Relation Age of Onset   Migraines Mother    Cancer Mother        liver cancer   Diabetes Maternal Aunt    Hypertension Maternal Grandmother    Colon cancer Maternal Grandfather 60   Prostate cancer Maternal Grandfather    Throat cancer Maternal Grandfather      Prior to Admission medications  Medication Sig Start Date End Date Taking? Authorizing Provider  acetaminophen  (TYLENOL ) 325 MG tablet Take 2 tablets (650 mg total) by mouth every 6 (six) hours as needed for mild pain (pain score 1-3) or fever. 02/20/24  Yes Alba Sharper, MD  clonazePAM  (KLONOPIN ) 0.5 MG tablet Take 1 tablet (0.5 mg total) by mouth 3 (three) times daily. 05/06/24  Yes Mabe, Elna, MD  KEPPRA  500 MG tablet Take 1,000-1,500 mg by mouth See admin instructions. Take 1,000 mg by mouth in the morning and 1,500 mg at bedtime   Yes [provider]  lacosamide  (VIMPAT ) 200 MG TABS tablet Take 1 tablet (200 mg total) by mouth 2 (two) times daily. 05/06/24  Yes Mabe, Elna, MD  melatonin 3 MG TABS tablet Take 3 mg by mouth at bedtime.   Yes [provider]  mirtazapine (REMERON) 7.5 MG tablet Take 22.5 mg by mouth at bedtime.   Yes [provider]  nystatin  powder Apply 1 Application topically See admin instructions. Apply under both breasts 2 times a day until healed   Yes [provider]  ondansetron  (ZOFRAN ) 4 MG tablet Take 4 mg by  mouth every 6 (six) hours as needed for nausea or vomiting (may be crushed and mixed into 5 ml's of water if needed- to digest).   Yes [provider]  pantoprazole  (PROTONIX ) 40 MG tablet Take 1 tablet (40 mg total) by mouth daily. Patient taking differently: Take 40 mg by mouth daily before breakfast. 02/20/24  Yes Alba Sharper, MD  Perampanel  4 MG TABS Take 1 tablet (4 mg total) by mouth daily. 05/06/24  Yes Mabe, Elna, MD  phenytoin  (DILANTIN ) 100 MG ER capsule Take 100 mg by mouth 2 (two) times daily.   Yes [provider]  polyethylene glycol (MIRALAX  / GLYCOLAX ) 17 g packet Take 17 g by mouth daily. 02/20/24  Yes Alba Sharper, MD  [Paused] QUEtiapine (SEROQUEL) 25 MG tablet Take 12.5 mg by mouth at bedtime. Wait to take this until your doctor or other care provider tells you to start again. 04/29/24  Yes [provider]  rosuvastatin  (CRESTOR ) 20 MG tablet Take 1 tablet (20 mg total) by mouth daily. Patient taking differently: Take 20 mg by mouth at bedtime. 02/20/24  Yes Alba Sharper, MD  senna-docusate (SENOKOT-S) 8.6-50 MG tablet Take 1 tablet by mouth daily. 02/20/24  Yes Alba Sharper, MD  levETIRAcetam  (KEPPRA ) 750 MG tablet Take 2 tablets (1,500 mg total) by mouth 2 (two) times daily. Patient not taking: Reported on 07/31/2024 11/26/23   Alba Sharper, MD    Physical Exam: Vitals:   07/31/24 1451 07/31/24 1856 07/31/24 2029 07/31/24 2030  BP: (!) 129/93 110/73  110/71  Pulse: 82 (!) 116  (!) 124  Resp: 16 18  14   Temp: (!) 92.9 F (33.8 C)  98.8 F (37.1 C)   TempSrc: Rectal  Temporal   SpO2: 96% 95%  96%     Constitutional: NAD, no pallor or diaphoresis   Eyes: PERTLA, lids and conjunctivae normal ENMT: Mucous membranes are dry. Posterior pharynx clear of any exudate or lesions.   Neck: supple, no masses  Respiratory:  no wheezing, no crackles. No accessory muscle use.  Cardiovascular: S1 & S2 heard, regular rate and rhythm. No extremity edema.   Abdomen: No tenderness, soft. Bowel sounds active.  Musculoskeletal: no clubbing / cyanosis. No joint deformity upper and lower extremities.   Skin: no significant rashes, lesions, ulcers. Warm, dry, well-perfused. Neurologic: CN 2-12 grossly intact. Moving all extremities. Sleeping, wakes to loud voice but not responding verbally.      Labs and Imaging on Admission: I have personally reviewed following labs and imaging studies  CBC: Recent Labs  Lab 07/31/24 1511  WBC 12.0*  NEUTROABS 10.7*  HGB 11.6*  HCT  37.7  MCV 85.7  PLT 184   Basic Metabolic Panel: Recent Labs  Lab 07/31/24 1511  NA 145  K 4.5  CL 105  CO2 24  GLUCOSE 105*  BUN 15  CREATININE 0.66  CALCIUM  10.2   GFR: CrCl cannot be calculated (Unknown ideal weight.). Liver Function Tests: Recent Labs  Lab 07/31/24 1511  AST 39  ALT 41  ALKPHOS 222*  BILITOT 0.4  PROT 9.4*  ALBUMIN 4.1   No results for input(s): LIPASE, AMYLASE in the last 168 hours. No results for input(s): AMMONIA in the last 168 hours. Coagulation Profile: Recent Labs  Lab 07/31/24 1311  INR 1.0   Cardiac Enzymes: No results for input(s): CKTOTAL, CKMB, CKMBINDEX, TROPONINI in the last 168 hours. BNP (last 3 results) No results for input(s): PROBNP in the last 8760 hours. HbA1C: No  results for input(s): HGBA1C in the last 72 hours. CBG: Recent Labs  Lab 07/31/24 1444  GLUCAP 99   Lipid Profile: No results for input(s): CHOL, HDL, LDLCALC, TRIG, CHOLHDL, LDLDIRECT in the last 72 hours. Thyroid  Function Tests: No results for input(s): TSH, T4TOTAL, FREET4, T3FREE, THYROIDAB in the last 72 hours. Anemia Panel: No results for input(s): VITAMINB12, FOLATE, FERRITIN, TIBC, IRON, RETICCTPCT in the last 72 hours. Urine analysis:    Component Value Date/Time   COLORURINE YELLOW 07/31/2024 1620   APPEARANCEUR CLEAR 07/31/2024 1620   LABSPEC 1.024 07/31/2024 1620   PHURINE 5.0 07/31/2024 1620   GLUCOSEU NEGATIVE 07/31/2024 1620   HGBUR NEGATIVE 07/31/2024 1620   BILIRUBINUR NEGATIVE 07/31/2024 1620   BILIRUBINUR negative 11/25/2020 1220   KETONESUR 80 (A) 07/31/2024 1620   PROTEINUR 30 (A) 07/31/2024 1620   UROBILINOGEN 0.2 11/25/2020 1220   NITRITE NEGATIVE 07/31/2024 1620   LEUKOCYTESUR NEGATIVE 07/31/2024 1620   Sepsis Labs: @LABRCNTIP (procalcitonin:4,lacticidven:4) ) Recent Results (from the past 240 hours)  Resp panel by RT-PCR (RSV, Flu A&B, Covid) Anterior Nasal Swab      Status: None   Collection Time: 07/31/24  4:21 PM   Specimen: Anterior Nasal Swab  Result Value Ref Range Status   SARS Coronavirus 2 by RT PCR NEGATIVE NEGATIVE Final    Comment: (NOTE) SARS-CoV-2 target nucleic acids are NOT DETECTED.  The SARS-CoV-2 RNA is generally detectable in upper respiratory specimens during the acute phase of infection. The lowest concentration of SARS-CoV-2 viral copies this assay can detect is 138 copies/mL. A negative result does not preclude SARS-Cov-2 infection and should not be used as the sole basis for treatment or other patient management decisions. A negative result may occur with  improper specimen collection/handling, submission of specimen other than nasopharyngeal swab, presence of viral mutation(s) within the areas targeted by this assay, and inadequate number of viral copies(<138 copies/mL). A negative result must be combined with clinical observations, patient history, and epidemiological information. The expected result is Negative.  Fact Sheet for Patients:  bloggercourse.com  Fact Sheet for Healthcare Providers:  seriousbroker.it  This test is no t yet approved or cleared by the [place]  FDA and  has been authorized for detection and/or diagnosis of SARS-CoV-2 by FDA under an Emergency Use Authorization (EUA). This EUA will remain  in effect (meaning this test can be used) for the duration of the COVID-19 declaration under Section 564(b)(1) of the Act, 21 U.S.C.section 360bbb-3(b)(1), unless the authorization is terminated  or revoked sooner.       Influenza A by PCR NEGATIVE NEGATIVE Final   Influenza B by PCR NEGATIVE NEGATIVE Final    Comment: (NOTE) The Xpert Xpress SARS-CoV-2/FLU/RSV plus assay is intended as an aid in the diagnosis of influenza from Nasopharyngeal swab specimens and should not be used as a sole basis for treatment. Nasal washings and aspirates are  unacceptable for Xpert Xpress SARS-CoV-2/FLU/RSV testing.  Fact Sheet for Patients: bloggercourse.com  Fact Sheet for Healthcare Providers: seriousbroker.it  This test is not yet approved or cleared by the [place]  FDA and has been authorized for detection and/or diagnosis of SARS-CoV-2 by FDA under an Emergency Use Authorization (EUA). This EUA will remain in effect (meaning this test can be used) for the duration of the COVID-19 declaration under Section 564(b)(1) of the Act, 21 U.S.C. section 360bbb-3(b)(1), unless the authorization is terminated or revoked.     Resp Syncytial Virus by PCR NEGATIVE NEGATIVE Final    Comment: (NOTE) Fact  Sheet for Patients: bloggercourse.com  Fact Sheet for Healthcare Providers: seriousbroker.it  This test is not yet approved or cleared by the [place]  FDA and has been authorized for detection and/or diagnosis of SARS-CoV-2 by FDA under an Emergency Use Authorization (EUA). This EUA will remain in effect (meaning this test can be used) for the duration of the COVID-19 declaration under Section 564(b)(1) of the Act, 21 U.S.C. section 360bbb-3(b)(1), unless the authorization is terminated or revoked.  Performed at [facility], [address], [place], [place] [address]      Radiological Exams on Admission: CT Head Wo Contrast Result Date: 07/31/2024 EXAM: CT HEAD WITHOUT CONTRAST 07/31/2024 04:07:58 PM TECHNIQUE: CT of the head was performed without the administration of intravenous contrast. Automated exposure control, iterative reconstruction, and/or weight based adjustment of the mA/kV was utilized to reduce the radiation dose to as low as reasonably achievable. COMPARISON: None available. CLINICAL HISTORY: Mental status change, unknown cause Mental status change, unknown cause FINDINGS: BRAIN AND VENTRICLES: No  acute hemorrhage. No evidence of acute infarct. No hydrocephalus. No extra-axial collection. No mass effect or midline shift. ORBITS: No acute abnormality. SINUSES: No acute abnormality. SOFT TISSUES AND SKULL: No acute soft tissue abnormality. No skull fracture. IMPRESSION: 1. No acute intracranial abnormality. Electronically signed by: Dorethia Molt MD 07/31/2024 05:54 PM EST RP Workstation: [id number]   DG Chest Portable 1 View Result Date: 07/31/2024 EXAM: 1 VIEW(S) XRAY OF THE CHEST 07/31/2024 04:28:00 PM COMPARISON: 06/29/2024 CLINICAL HISTORY: cough, AMS FINDINGS: LUNGS AND PLEURA: Low lung volume. Elevated left hemidiaphragm. No focal pulmonary opacity. No pleural effusion. No pneumothorax. HEART AND MEDIASTINUM: No acute abnormality of the cardiac and mediastinal silhouettes. BONES AND SOFT TISSUES: No acute osseous abnormality. IMPRESSION: 1. No acute findings. Electronically signed by: Greig Pique MD 07/31/2024 04:50 PM EST RP Workstation: [id number]    EKG: Independently reviewed. Sinus rhythm, 1st degree AV block, QTc 518.   Assessment/Plan  1. Acute encephalopathy; hypothermia  - Lactic acid is normal, renal function normal, and no acute findings on head CT in ED  - She had similar presentation in September 2025 with negative sepsis workup, normal cortisol, elevated TSH with normal FT4, and elevated phenytoin  level though neurology did not believe that the condition was due to phenytoin  at that time  - Possible autonomic dysfunction due to cerebral palsy and medications  - Continue warming blanket until core temp >/= 36 C, check phenytoin  level, ammonia, TSH, free T4, cortisol, and procalcitonin, hold Seroquel and Remeron, follow cultures and clinical course    2. Seizure disorder  - Continue her home anti-seizure regimen (Keppra , Vimpat , Dilantin , perampanel , Klonopin ), use IV formulation for now where possible given her decreased LOC    3. Cerebral palsy  - Supportive care     4. Prolonged QT interval  - Correct hypothermia, check magnesium  level, avoid QT-prolonging medications     DVT prophylaxis: Lovenox   Code Status: Full, paperwork stating this at bedside  Level of Care: Level of care: Stepdown Family Communication: None present  Disposition Plan:  Patient is from: SNF  Anticipated d/c is to: SNF  Anticipated d/c date is: Possibly as early as 12/19 or 08/02/24  Patient currently: Pending stable body temperature and improved LOC  Consults called: None  Admission status: Observation     Evalene GORMAN Sprinkles, MD [facility]  07/31/2024, 10:12 PM       [1] No Known Allergies

## 2024-07-31 NOTE — ED Notes (Signed)
 IV team at bedside

## 2024-07-31 NOTE — ED Notes (Addendum)
 Blue top of one set of Blood cultures sent by [facility]  Unable to obtain second set or red top.

## 2024-07-31 NOTE — ED Provider Notes (Signed)
 [facility] AT [facility] Provider Note   CSN: [id number] Arrival date & time: 07/31/24  1427     History  Chief Complaint  Patient presents with   Altered Mental Status    Patricia White is a 53 y.o. female with PMH as listed below who presents with  Pt arrived via EMS, AMS x2 days per staff. Staff at [facility] unsure of baseline mentation. Poor PO intake x2 days. EMS states only responding to tactile stimuli.   Per previous notes, pt has baseline cognitive impairment. Alert and oriented to self only.  Per ED note from 06/29/24, notes that she is A&Ox4. On arrival to ED, she is somnolent with pinpoint pupils. Level 5 caveat.   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 Authorizing Provider  acetaminophen  (TYLENOL ) 325 MG tablet Take 2 tablets (650 mg total) by mouth every 6 (six) hours as needed for mild pain (pain score 1-3) or fever. 02/20/24  Yes Alba Sharper, MD  clonazePAM  (KLONOPIN ) 0.5 MG tablet Take 1 tablet (0.5 mg total) by mouth 3 (three) times daily. 05/06/24  Yes Mabe, Elna, MD  KEPPRA  500 MG tablet Take 1,000-1,500 mg by mouth See admin instructions. Take 1,000 mg by mouth in the morning and 1,500 mg at bedtime   Yes [provider]  lacosamide  (VIMPAT ) 200 MG TABS tablet Take 1 tablet (200 mg total) by mouth 2 (two) times daily. 05/06/24  Yes Mabe, Elna, MD  melatonin 3 MG TABS tablet Take 3 mg by mouth at bedtime.   Yes [provider]  mirtazapine (REMERON) 7.5 MG tablet Take 22.5 mg by mouth at bedtime.   Yes [provider]  nystatin  powder Apply 1 Application topically See admin instructions. Apply under both breasts 2 times a day until healed   Yes [provider]  ondansetron  (ZOFRAN ) 4 MG tablet Take 4 mg by mouth every 6 (six) hours as needed for nausea or vomiting (may be crushed and mixed into 5 ml's of water if needed- to digest).   Yes [provider]  pantoprazole  (PROTONIX ) 40 MG tablet Take 1 tablet (40 mg total) by mouth daily. Patient taking differently: Take 40 mg by mouth daily before breakfast. 02/20/24  Yes Alba Sharper, MD  Perampanel  4 MG TABS Take 1 tablet (4 mg total) by mouth daily. 05/06/24  Yes Mabe, Elna, MD  phenytoin  (DILANTIN ) 100 MG ER capsule Take 100 mg by mouth 2 (two) times daily.   Yes [provider]  polyethylene glycol (MIRALAX  / GLYCOLAX ) 17 g packet Take 17 g by mouth daily. 02/20/24  Yes Alba Sharper, MD  [Paused] QUEtiapine (SEROQUEL) 25 MG tablet Take 12.5 mg by mouth at bedtime. Wait to take this until your doctor or other care provider tells you to start again. 04/29/24  Yes [provider]  rosuvastatin  (CRESTOR ) 20 MG tablet Take 1 tablet (20 mg total) by mouth daily. Patient taking differently: Take 20 mg by mouth at bedtime. 02/20/24  Yes Alba Sharper, MD  senna-docusate (SENOKOT-S) 8.6-50 MG tablet Take 1 tablet by mouth daily. 02/20/24  Yes Alba Sharper, MD  levETIRAcetam  (KEPPRA ) 750 MG tablet Take 2 tablets (1,500 mg total) by mouth 2 (two)  times daily. Patient not taking: Reported on 07/31/2024 11/26/23   Alba Sharper, MD      Allergies    Patient has no known allergies.    Review of Systems   Review of Systems A 10 point review of systems was performed and is negative unless otherwise reported in HPI.  Physical Exam Updated Vital Signs BP 110/71   Pulse (!) 124   Temp 98.8 F (37.1 C) (Temporal)   Resp 14   SpO2 96%  Physical Exam General: Unwell unresponsive appearing female, lying in bed.  HEENT: Pinpoint pupils, NCAT, Sclera anicteric, mouth agape, very dry mucous membranes, trachea midline.  Cardiology: RRR, no murmurs/rubs/gallops. Resp: Normal  respiratory rate and effort. CTAB, no wheezes, rhonchi, crackles. Intermittent wet cough. Abd: Soft, non-tender, non-distended. No rebound tenderness or guarding.  GU: Deferred. MSK: No peripheral edema or signs of trauma. Extremities without deformity or TTP. No cyanosis or clubbing. Skin: cool, dry Neuro: Unresponsive to voice or pain, intermittently coughing  ED Results / Procedures / Treatments   Labs (all labs ordered are listed, but only abnormal results are displayed) Labs Reviewed  CBC WITH DIFFERENTIAL/PLATELET - Abnormal; Notable for the following components:      Result Value   WBC 12.0 (*)    Hemoglobin 11.6 (*)    RDW 17.8 (*)    nRBC 0.3 (*)    Neutro Abs 10.7 (*)    All other components within normal limits  COMPREHENSIVE METABOLIC PANEL WITH GFR - Abnormal; Notable for the following components:   Glucose, Bld 105 (*)    Total Protein 9.4 (*)    Alkaline Phosphatase 222 (*)    Anion gap 16 (*)    All other components within normal limits  URINALYSIS, ROUTINE W REFLEX MICROSCOPIC - Abnormal; Notable for the following components:   Ketones, ur 80 (*)    Protein, ur 30 (*)    All other components within normal limits  BLOOD GAS, VENOUS - Abnormal; Notable for the following components:   pO2, Ven 65 (*)    All other components within normal limits  RESP PANEL BY RT-PCR (RSV, FLU A&B, COVID)  [id number]  CULTURE, BLOOD (ROUTINE X 2)  CULTURE, BLOOD (ROUTINE X 2)  PROTIME-INR  CBG MONITORING, ED  CBG MONITORING, ED  I-STAT CG4 LACTIC ACID, ED  I-STAT CG4 LACTIC ACID, ED    EKG EKG Interpretation Date/Time:  Thursday July 31 2024 14:53:55 EST Ventricular Rate:  82 PR Interval:  212 QRS Duration:  100 QT Interval:  443 QTC Calculation: 518 R Axis:   76  Text Interpretation: Sinus rhythm  Prolonged PR interval  Abnormal R-wave progression, early transition  Prolonged QT interval  Similar to prior, but QT and PR have slightly lengthened    Confirmed by Franklyn Gills [phone]) on 07/31/2024 3:26:54 PM  Radiology CT Head Wo Contrast Result Date: 07/31/2024 EXAM: CT HEAD WITHOUT CONTRAST 07/31/2024 04:07:58 PM TECHNIQUE: CT of the head was performed without the administration of intravenous contrast. Automated exposure control, iterative reconstruction, and/or weight based adjustment of the mA/kV was utilized to reduce the radiation dose to as low as reasonably achievable. COMPARISON: None available. CLINICAL HISTORY: Mental status change, unknown cause Mental status change, unknown cause FINDINGS: BRAIN AND VENTRICLES: No acute hemorrhage. No evidence of acute infarct. No hydrocephalus. No extra-axial collection. No mass effect or midline shift. ORBITS: No acute abnormality. SINUSES: No acute abnormality. SOFT TISSUES AND SKULL: No acute soft tissue abnormality. No skull fracture. IMPRESSION:  1. No acute intracranial abnormality. Electronically signed by: Dorethia Molt MD 07/31/2024 05:54 PM EST RP Workstation: [id number]   DG Chest Portable 1 View Result Date: 07/31/2024 EXAM: 1 VIEW(S) XRAY OF THE CHEST 07/31/2024 04:28:00 PM COMPARISON: 06/29/2024 CLINICAL HISTORY: cough, AMS FINDINGS: LUNGS AND PLEURA: Low lung volume. Elevated left hemidiaphragm. No focal pulmonary opacity. No pleural effusion. No pneumothorax. HEART AND MEDIASTINUM: No acute abnormality of the cardiac and mediastinal silhouettes. BONES AND SOFT TISSUES: No acute osseous abnormality. IMPRESSION: 1. No acute findings. Electronically signed by: Greig Pique MD 07/31/2024 04:50 PM EST RP Workstation: [id number]    Procedures .Critical Care  Performed by: Franklyn Sid SAILOR, MD Authorized by: Franklyn Sid SAILOR, MD   Critical care provider statement:    Critical care time (minutes):  40   Critical care was necessary to treat or prevent imminent or life-threatening deterioration of the following conditions:  Sepsis, dehydration and CNS failure or compromise   Critical care was time spent  personally by me on the following activities:  Development of treatment plan with patient or surrogate, discussions with consultants, evaluation of patient's response to treatment, examination of patient, ordering and review of laboratory studies, ordering and review of radiographic studies, ordering and performing treatments and interventions, pulse oximetry, re-evaluation of patient's condition and review of old charts   Care discussed with: admitting provider       Medications Ordered in ED Medications  naloxone  (NARCAN ) 4 MG/0.1ML nasal spray kit (0 sprays  Hold 07/31/24 1520)  naloxone  (NARCAN ) 2 MG/2ML injection (4 mg Nasal Given 07/31/24 1519)  lactated ringers  bolus 1,000 mL (0 mLs Intravenous Stopped 07/31/24 1851)  ceFEPIme  (MAXIPIME ) 2 g in sodium chloride  0.9 % 100 mL IVPB (0 g Intravenous Stopped 07/31/24 1621)  lactated ringers  bolus 1,000 mL (1,000 mLs Intravenous Bolus 07/31/24 2103)    ED Course/ Medical Decision Making/ A&P                          Medical Decision Making Amount and/or Complexity of Data Reviewed Labs: ordered. Decision-making details documented in ED Course. Radiology: ordered. Decision-making details documented in ED Course.  Risk Decision regarding hospitalization.    This patient presents to the ED for concern of somnolence/AMS, this involves an extensive number of treatment options, and is a complaint that carries with it a high risk of complications and morbidity.  I considered the following differential and admission for this acute, potentially life threatening condition.   MDM:    D/t pinpoint pupils and unresponsiveness, patient was given 4mg  IN narcan  on arrival with seemingly some improvement in her mental status. She did become responsive to pain and began to moan with painful stimuli, which she had not done before. She doesn't have any opiates listed on her home medication list.   Ddx of acute altered mental status or encephalopathy  considered but not limited to: -Intracranial abnormalities such as ICH, hydrocephalus; no head trauma noted, CTH neg for acute findings -Infection such as UTI, PNA, or viral syndrome given cough observed - CXR negative, viral panel neg; no hypoxia, no bradypnea/tachypnea. She is hypothermic on arrival to 1F which improves w/ bair hugger. -Toxic ingestion such as opioid overdose given pinpoint pupils and possible response to narcan ; polypharmacy given multiple AEDs as well as remeron listed on home meds -No significant electrolyte abnormalities or hyper/hypoglycemia -No hypercarbia or hypoxia -EKG w/o signs of ischemia or arrhythmia -Ketones on urine, likely dehydrated w/ dry mucous  membranes, will give fluid. -Does have h/o seizures; she is responsive to pain, doubt status epilepticus, but postictal state or prolonged postictal state is possible   Clinical Course as of 07/31/24 2139  Thu Jul 31, 2024  1509 Glucose-Capillary: 99 [HN]  1526 WBC(!): 12.0 +leukocytosis [HN]  1656 Lactic Acid, Venous: 1.7 wnl [HN]  1656 DG Chest Portable 1 View 1. No acute findings. [HN]  1802 CT Head Wo Contrast 1. No acute intracranial abnormality. [HN]  1802 DG Chest Portable 1 View 1. No acute findings. [HN]  1802 Ketones, ur(!): 80 Will give fluids [HN]  1826 Asking RN x3 for blood gas  [HN]  2024 Patient now complaining of chest pain. Repeatedly asking RN for VBG. [HN]  2057 HR now 110-120 bpm. Will give additional 1L IVF . Already had sent one blood culture. W/ tachcyardia and leukocytosis w/ hypothermia on arrival patient is SIRS+ w/ Unknown source, had already given IV cefepime . Will admit to hospitalist.  [HN]    Clinical Course User Index [HN] Franklyn Sid SAILOR, MD    Labs: I Ordered, and personally interpreted labs.  The pertinent results include:  those listed above  Imaging Studies ordered: I ordered imaging studies including CTH, CXR I independently visualized and interpreted  imaging. I agree with the radiologist interpretation  Additional history obtained from chart review, EMS.    Cardiac Monitoring: The patient was maintained on a cardiac monitor.  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   lactated ringers  bolus 1,000 mL    I have reviewed the patients home medicines and have made adjustments as needed  Problem List / ED Course: Problem List Items Addressed This Visit       Other   AMS (altered mental status) - Primary                This note was created using dictation software, which may contain spelling or grammatical errors.    Franklyn Sid SAILOR, MD 07/31/24 [phone]

## 2024-07-31 NOTE — Sepsis Progress Note (Signed)
 Sepsis protocol monitored by eLink

## 2024-08-01 ENCOUNTER — Other Ambulatory Visit: Payer: Self-pay

## 2024-08-01 DIAGNOSIS — E785 Hyperlipidemia, unspecified: Secondary | ICD-10-CM | POA: Diagnosis present

## 2024-08-01 DIAGNOSIS — R9431 Abnormal electrocardiogram [ECG] [EKG]: Secondary | ICD-10-CM | POA: Diagnosis present

## 2024-08-01 DIAGNOSIS — Z79899 Other long term (current) drug therapy: Secondary | ICD-10-CM | POA: Diagnosis not present

## 2024-08-01 DIAGNOSIS — Z8249 Family history of ischemic heart disease and other diseases of the circulatory system: Secondary | ICD-10-CM | POA: Diagnosis not present

## 2024-08-01 DIAGNOSIS — R946 Abnormal results of thyroid function studies: Secondary | ICD-10-CM | POA: Diagnosis present

## 2024-08-01 DIAGNOSIS — R4182 Altered mental status, unspecified: Secondary | ICD-10-CM

## 2024-08-01 DIAGNOSIS — G809 Cerebral palsy, unspecified: Secondary | ICD-10-CM | POA: Diagnosis present

## 2024-08-01 DIAGNOSIS — R680 Hypothermia, not associated with low environmental temperature: Secondary | ICD-10-CM | POA: Diagnosis present

## 2024-08-01 DIAGNOSIS — J45909 Unspecified asthma, uncomplicated: Secondary | ICD-10-CM | POA: Diagnosis present

## 2024-08-01 DIAGNOSIS — R338 Other retention of urine: Secondary | ICD-10-CM | POA: Diagnosis not present

## 2024-08-01 DIAGNOSIS — G9340 Encephalopathy, unspecified: Secondary | ICD-10-CM | POA: Diagnosis present

## 2024-08-01 DIAGNOSIS — Z1152 Encounter for screening for COVID-19: Secondary | ICD-10-CM | POA: Diagnosis not present

## 2024-08-01 DIAGNOSIS — R6510 Systemic inflammatory response syndrome (SIRS) of non-infectious origin without acute organ dysfunction: Secondary | ICD-10-CM

## 2024-08-01 DIAGNOSIS — E869 Volume depletion, unspecified: Secondary | ICD-10-CM | POA: Diagnosis present

## 2024-08-01 DIAGNOSIS — E860 Dehydration: Secondary | ICD-10-CM | POA: Diagnosis present

## 2024-08-01 DIAGNOSIS — F71 Moderate intellectual disabilities: Secondary | ICD-10-CM | POA: Diagnosis present

## 2024-08-01 DIAGNOSIS — R4189 Other symptoms and signs involving cognitive functions and awareness: Secondary | ICD-10-CM | POA: Diagnosis not present

## 2024-08-01 DIAGNOSIS — R7989 Other specified abnormal findings of blood chemistry: Secondary | ICD-10-CM | POA: Diagnosis not present

## 2024-08-01 DIAGNOSIS — G9341 Metabolic encephalopathy: Secondary | ICD-10-CM

## 2024-08-01 DIAGNOSIS — G40909 Epilepsy, unspecified, not intractable, without status epilepticus: Secondary | ICD-10-CM | POA: Diagnosis not present

## 2024-08-01 DIAGNOSIS — D72829 Elevated white blood cell count, unspecified: Secondary | ICD-10-CM | POA: Diagnosis present

## 2024-08-01 DIAGNOSIS — R339 Retention of urine, unspecified: Secondary | ICD-10-CM | POA: Diagnosis present

## 2024-08-01 DIAGNOSIS — G40901 Epilepsy, unspecified, not intractable, with status epilepticus: Secondary | ICD-10-CM | POA: Diagnosis present

## 2024-08-01 LAB — BASIC METABOLIC PANEL WITH GFR
Anion gap: 15 (ref 5–15)
BUN: 11 mg/dL (ref 6–20)
CO2: 23 mmol/L (ref 22–32)
Calcium: 9.1 mg/dL (ref 8.9–10.3)
Chloride: 109 mmol/L (ref 98–111)
Creatinine, Ser: 0.71 mg/dL (ref 0.44–1.00)
GFR, Estimated: >60 mL/min
Glucose, Bld: 78 mg/dL (ref 70–99)
Potassium: 3.6 mmol/L (ref 3.5–5.1)
Sodium: 147 mmol/L — ABNORMAL HIGH (ref 135–145)

## 2024-08-01 LAB — HEPATIC FUNCTION PANEL
ALT: 25 U/L (ref 0–44)
AST: 28 U/L (ref 15–41)
Albumin: 3.3 g/dL — ABNORMAL LOW (ref 3.5–5.0)
Alkaline Phosphatase: 188 U/L — ABNORMAL HIGH (ref 38–126)
Bilirubin, Direct: 0.2 mg/dL (ref 0.0–0.2)
Indirect Bilirubin: 0.2 mg/dL — ABNORMAL LOW (ref 0.3–0.9)
Total Bilirubin: 0.4 mg/dL (ref 0.0–1.2)
Total Protein: 7.5 g/dL (ref 6.5–8.1)

## 2024-08-01 LAB — CBC
HCT: 33.8 % — ABNORMAL LOW (ref 36.0–46.0)
Hemoglobin: 10.6 g/dL — ABNORMAL LOW (ref 12.0–15.0)
MCH: 26.2 pg (ref 26.0–34.0)
MCHC: 31.4 g/dL (ref 30.0–36.0)
MCV: 83.7 fL (ref 80.0–100.0)
Platelets: 112 K/uL — ABNORMAL LOW (ref 150–400)
RBC: 4.04 MIL/uL (ref 3.87–5.11)
RDW: 17.7 % — ABNORMAL HIGH (ref 11.5–15.5)
WBC: 8.5 K/uL (ref 4.0–10.5)
nRBC: 0.5 % — ABNORMAL HIGH (ref 0.0–0.2)

## 2024-08-01 LAB — AMMONIA: Ammonia: 32 umol/L (ref 9–35)

## 2024-08-01 LAB — PHENYTOIN LEVEL, TOTAL: Phenytoin Lvl: 18.8 ug/mL (ref 10.0–20.0)

## 2024-08-01 LAB — MAGNESIUM: Magnesium: 1.8 mg/dL (ref 1.7–2.4)

## 2024-08-01 LAB — MRSA NEXT GEN BY PCR, NASAL: MRSA by PCR Next Gen: DETECTED — AB

## 2024-08-01 LAB — HIV ANTIBODY (ROUTINE TESTING W REFLEX): HIV Screen 4th Generation wRfx: NONREACTIVE

## 2024-08-01 LAB — T4, FREE: Free T4: 0.84 ng/dL (ref 0.80–2.00)

## 2024-08-01 LAB — CORTISOL: Cortisol, Plasma: 8.1 ug/dL

## 2024-08-01 MED ORDER — CHLORHEXIDINE GLUCONATE CLOTH 2 % EX PADS: 6.0000 | MEDICATED_PAD | Freq: Every day | CUTANEOUS | Status: DC

## 2024-08-01 MED ORDER — MUPIROCIN 2 % EX OINT
TOPICAL_OINTMENT | Freq: Two times a day (BID) | CUTANEOUS | Status: DC
  Administered 2024-08-01 – 2024-08-02 (×2): 1 via NASAL
  Filled 2024-08-01 (×3): qty 22

## 2024-08-01 MED ORDER — ORAL CARE MOUTH RINSE
15.0000 mL | OROMUCOSAL | Status: DC
  Administered 2024-08-01 – 2024-08-03 (×6): 15 mL via OROMUCOSAL

## 2024-08-01 MED ORDER — ORAL CARE MOUTH RINSE: 15.0000 mL | OROMUCOSAL | Status: DC | PRN

## 2024-08-01 MED ORDER — CHLORHEXIDINE GLUCONATE CLOTH 2 % EX PADS
6.0000 | MEDICATED_PAD | Freq: Every day | CUTANEOUS | Status: DC
  Administered 2024-08-01 – 2024-08-04 (×4): 6 via TOPICAL

## 2024-08-01 MED ORDER — PHENYTOIN SODIUM 50 MG/ML IJ SOLN
100.0000 mg | Freq: Two times a day (BID) | INTRAMUSCULAR | Status: DC
  Administered 2024-08-01: 100 mg via INTRAVENOUS
  Filled 2024-08-01: qty 2

## 2024-08-01 MED ORDER — ORAL CARE MOUTH RINSE: 15.0000 mL | OROMUCOSAL | Status: DC

## 2024-08-01 MED ORDER — LORAZEPAM 2 MG/ML IJ SOLN
1.0000 mg | Freq: Three times a day (TID) | INTRAMUSCULAR | Status: DC
  Administered 2024-08-01 – 2024-08-02 (×6): 1 mg via INTRAVENOUS
  Filled 2024-08-01 (×7): qty 1

## 2024-08-01 NOTE — Progress Notes (Signed)
 " Progress Note    Patricia White   FMW:[id number]  DOB: 04-11-1971  DOA: 07/31/2024     0 PCP: Alba Sharper, MD  Initial CC: Altered mentation  Hospital Course: Patricia White is a 53 y.o. female with medical history significant for cerebral palsy, intellectual disability, seizure disorder, and hyperlipidemia who was sent to the ED from her nursing facility for altered mental status.   Patient is reportedly not eating or drinking much and has had decreased level of consciousness for the past 2 days.  At her baseline, she is said to be mostly nonverbal but alert and interactive.   In the ER she was found to be hypothermic, 92.9 F.  Tachycardic in the 110s.  She underwent workup for infectious etiology and admitted for ongoing monitoring.  Of note, she was hospitalized in September 2025 for similar presentation with altered mentation and hypothermia.  She was evaluated by neurology at that time as well and underwent testing with EEG.  She is felt to have baseline abnormal EEG and treatment has been centered around treating her clinically instead per neurology.  She did have supratherapeutic Dilantin  level at that time and medication was held until level decreased.  Mentation slowly returned to baseline with no significant intervention during that hospitalization.  Assessment/Plan:   Acute metabolic encephalopathy Seizure disorder Cerebral palsy/IDD - Again presents with similar episode compared to September.  Decreased level of consciousness and not eating/drinking much.  Hypothermic in the ER, 92.9 F - Dilantin  level was not as elevated as previously in September, 20.6 on admission - EEG initially ordered after admission but after discussion with neurology, now being canceled since abnormal in the past - Continue Keppra , changed to IV - Home clonazepam  changed to Ativan  IV until safe for oral intake - Dilantin  on hold for now until seen by neurology - Continue Vimpat  -  Perampanel  tabs ordered but patient unable to take oral at this time - continue IVF - NPO for now until safe for PO again  SIRS - hypothermic, WBC 12, tachycardia, tachypnea, normal lactic acid - Suspect volume depletion contributing and possibly breakthrough seizure - no obvious infection discovered on workup thus far: UA negative, CT head unremarkable, CXR negative, blood cultures drawn and negative so far, negative PCT as well - continue IVF - continue monitoring off abx at this time   Prolonged QTc - Continue trending electrolytes and replete as needed  Interval History:  Remains minimally interactive.  She does withdraw to pain in all 4 extremities but does not follow commands.  Seems to be at least a little bit more awake compared to admission.  Antimicrobials: N/a  DVT prophylaxis:  enoxaparin  (LOVENOX ) injection 40 mg Start: 08/01/24 1000   Code Status:   Code Status: Full Code  Mobility Assessment (Last 72 Hours)     Mobility Assessment     Row Name 08/01/24 0800 08/01/24 0200         Does the patient have exclusion criteria? Yes- Bedfast (Level 1) - Select exclusion criteria in next row Yes- Bedfast (Level 1) - Select exclusion criteria in next row      Mobility Assessment Exclusion Criteria No exclusion criteria present, perform mobility assessment No exclusion criteria present, perform mobility assessment      What is the highest level of mobility based on the mobility assessment? Level 1 (Bedfast) - Unable to balance while sitting on edge of bed Level 1 (Bedfast) - Unable to balance while sitting on edge  of bed      Is the above level different from baseline mobility prior to current illness? No - Consider discontinuing PT/OT Yes - Recommend PT order         Diet: Diet Orders (From admission, onward)     Start     Ordered   07/31/24 2208  Diet NPO time specified Except for: Sips with Meds  Diet effective now       Comments: She can have a regular diet once  more alert  Question:  Except for  Answer:  Sips with Meds   07/31/24 2211            Barriers to discharge: none Disposition Plan:  Facility HH orders placed: n/a Status is: Obs needs inpt  Objective: Blood pressure 120/69, pulse (!) 107, temperature 97.8 F (36.6 C), temperature source Axillary, resp. rate 18, height 5' 5 (1.651 m), weight 65.5 kg, SpO2 99%.  Examination:  Physical Exam Constitutional:      Comments: Noninteractive but does withdrawal to painful stimuli and tries to push me away intentionally when examining  HENT:     Head: Normocephalic and atraumatic.     Mouth/Throat:     Comments: Extremely unkempt oral mucosa and teeth Eyes:     Extraocular Movements: Extraocular movements intact.  Cardiovascular:     Rate and Rhythm: Normal rate and regular rhythm.  Pulmonary:     Effort: Pulmonary effort is normal. No respiratory distress.     Breath sounds: Normal breath sounds. No wheezing.  Abdominal:     General: Bowel sounds are normal. There is no distension.     Palpations: Abdomen is soft.     Tenderness: There is no abdominal tenderness.  Musculoskeletal:        General: Normal range of motion.     Cervical back: Normal range of motion and neck supple.  Skin:    General: Skin is warm and dry.  Neurological:     Comments: Withdraws to pain but does not follow commands.  She is awake and grimaces with pain as well      Consultants:  Neurology  Procedures:    Data Reviewed: Results for orders placed or performed during the hospital encounter of 07/31/24 (from the past 24 hours)  Protime-INR     Status: None   Collection Time: 07/31/24  1:11 PM  Result Value Ref Range   Prothrombin Time 14.2 11.4 - 15.2 seconds   INR 1.0 0.8 - 1.2  CBG monitoring, ED     Status: None   Collection Time: 07/31/24  2:44 PM  Result Value Ref Range   Glucose-Capillary 99 70 - 99 mg/dL  CBC with Differential     Status: Abnormal   Collection Time: 07/31/24   3:11 PM  Result Value Ref Range   WBC 12.0 (H) 4.0 - 10.5 K/uL   RBC 4.40 3.87 - 5.11 MIL/uL   Hemoglobin 11.6 (L) 12.0 - 15.0 g/dL   HCT 62.2 63.9 - 53.9 %   MCV 85.7 80.0 - 100.0 fL   MCH 26.4 26.0 - 34.0 pg   MCHC 30.8 30.0 - 36.0 g/dL   RDW 82.1 (H) 88.4 - 84.4 %   Platelets 184 150 - 400 K/uL   nRBC 0.3 (H) 0.0 - 0.2 %   Neutrophils Relative % 89 %   Neutro Abs 10.7 (H) 1.7 - 7.7 K/uL   Lymphocytes Relative 7 %   Lymphs Abs 0.8 0.7 - 4.0 K/uL  Monocytes Relative 3 %   Monocytes Absolute 0.4 0.1 - 1.0 K/uL   Eosinophils Relative 1 %   Eosinophils Absolute 0.1 0.0 - 0.5 K/uL   Basophils Relative 0 %   Basophils Absolute 0.0 0.0 - 0.1 K/uL   Immature Granulocytes 0 %   Abs Immature Granulocytes 0.04 0.00 - 0.07 K/uL  Comprehensive metabolic panel     Status: Abnormal   Collection Time: 07/31/24  3:11 PM  Result Value Ref Range   Sodium 145 135 - 145 mmol/L   Potassium 4.5 3.5 - 5.1 mmol/L   Chloride 105 98 - 111 mmol/L   CO2 24 22 - 32 mmol/L   Glucose, Bld 105 (H) 70 - 99 mg/dL   BUN 15 6 - 20 mg/dL   Creatinine, Ser 9.33 0.44 - 1.00 mg/dL   Calcium  10.2 8.9 - 10.3 mg/dL   Total Protein 9.4 (H) 6.5 - 8.1 g/dL   Albumin 4.1 3.5 - 5.0 g/dL   AST 39 15 - 41 U/L   ALT 41 0 - 44 U/L   Alkaline Phosphatase 222 (H) 38 - 126 U/L   Total Bilirubin 0.4 0.0 - 1.2 mg/dL   GFR, Estimated >39 >39 mL/min   Anion gap 16 (H) 5 - 15  I-Stat Lactic Acid, ED     Status: None   Collection Time: 07/31/24  3:52 PM  Result Value Ref Range   Lactic Acid, Venous 1.7 0.5 - 1.9 mmol/L  Urinalysis, Routine w reflex microscopic -Urine, Clean Catch     Status: Abnormal   Collection Time: 07/31/24  4:20 PM  Result Value Ref Range   Color, Urine YELLOW YELLOW   APPearance CLEAR CLEAR   Specific Gravity, Urine 1.024 1.005 - 1.030   pH 5.0 5.0 - 8.0   Glucose, UA NEGATIVE NEGATIVE mg/dL   Hgb urine dipstick NEGATIVE NEGATIVE   Bilirubin Urine NEGATIVE NEGATIVE   Ketones, ur 80 (A) NEGATIVE  mg/dL   Protein, ur 30 (A) NEGATIVE mg/dL   Nitrite NEGATIVE NEGATIVE   Leukocytes,Ua NEGATIVE NEGATIVE   RBC / HPF 6-10 0 - 5 RBC/hpf   WBC, UA 11-20 0 - 5 WBC/hpf   Bacteria, UA NONE SEEN NONE SEEN   Squamous Epithelial / HPF 0-5 0 - 5 /HPF   Mucus PRESENT    Hyaline Casts, UA PRESENT   Resp panel by RT-PCR (RSV, Flu A&B, Covid) Anterior Nasal Swab     Status: None   Collection Time: 07/31/24  4:21 PM   Specimen: Anterior Nasal Swab  Result Value Ref Range   SARS Coronavirus 2 by RT PCR NEGATIVE NEGATIVE   Influenza A by PCR NEGATIVE NEGATIVE   Influenza B by PCR NEGATIVE NEGATIVE   Resp Syncytial Virus by PCR NEGATIVE NEGATIVE  I-Stat Lactic Acid, ED     Status: None   Collection Time: 07/31/24  8:20 PM  Result Value Ref Range   Lactic Acid, Venous 1.3 0.5 - 1.9 mmol/L  Blood gas, venous     Status: Abnormal   Collection Time: 07/31/24  8:43 PM  Result Value Ref Range   pH, Ven 7.35 7.25 - 7.43   pCO2, Ven 46 44 - 60 mmHg   pO2, Ven 65 (H) 32 - 45 mmHg   Bicarbonate 25.4 20.0 - 28.0 mmol/L   Acid-base deficit 0.5 0.0 - 2.0 mmol/L   O2 Saturation 94.7 %   Patient temperature 37.0   Blood culture (routine x 2)     Status:  None (Preliminary result)   Collection Time: 07/31/24  8:57 PM   Specimen: BLOOD RIGHT ARM  Result Value Ref Range   Specimen Description      BLOOD RIGHT ARM Performed at [facility], [address], [place], [place] [address]    Special Requests      BOTTLES DRAWN AEROBIC AND ANAEROBIC Blood Culture results may not be optimal due to an inadequate volume of blood received in culture bottles Performed at [facility], [address], [place], [place] [address]    Culture      NO GROWTH < 12 HOURS Performed at [facility], [address] [place], [place] [address]    Report Status PENDING   Phenytoin  level, total     Status: Abnormal   Collection Time: 07/31/24 10:52 PM  Result Value Ref Range    Phenytoin  Lvl 20.6 (H) 10.0 - 20.0 ug/mL  Procalcitonin     Status: None   Collection Time: 07/31/24 10:52 PM  Result Value Ref Range   Procalcitonin <0.10 ng/mL  TSH     Status: None   Collection Time: 07/31/24 10:53 PM  Result Value Ref Range   TSH 1.960 0.350 - 4.500 uIU/mL  T4, free     Status: None   Collection Time: 07/31/24 10:53 PM  Result Value Ref Range   Free T4 0.84 0.80 - 2.00 ng/dL  MRSA Next Gen by PCR, Nasal     Status: Abnormal   Collection Time: 08/01/24  1:38 AM   Specimen: Nasal Mucosa; Nasal Swab  Result Value Ref Range   MRSA by PCR Next Gen DETECTED (A) NOT DETECTED  Ammonia     Status: None   Collection Time: 08/01/24  2:29 AM  Result Value Ref Range   Ammonia 32 9 - 35 umol/L  Blood culture (routine x 2)     Status: None (Preliminary result)   Collection Time: 08/01/24  3:33 AM   Specimen: BLOOD  Result Value Ref Range   Specimen Description      BLOOD BLOOD LEFT ARM Performed at [facility], [address], [place], [place] [address]    Special Requests      BOTTLES DRAWN AEROBIC ONLY Blood Culture results may not be optimal due to an inadequate volume of blood received in culture bottles Performed at [facility], [address], [place], [place] [address]    Culture      NO GROWTH < 12 HOURS Performed at [facility], [address] [place], [place] [address]    Report Status PENDING   HIV Antibody (routine testing w rflx)     Status: None   Collection Time: 08/01/24  3:33 AM  Result Value Ref Range   HIV Screen 4th Generation wRfx Non Reactive Non Reactive  CBC     Status: Abnormal   Collection Time: 08/01/24  3:33 AM  Result Value Ref Range   WBC 8.5 4.0 - 10.5 K/uL   RBC 4.04 3.87 - 5.11 MIL/uL   Hemoglobin 10.6 (L) 12.0 - 15.0 g/dL   HCT 66.1 (L) 63.9 - 53.9 %   MCV 83.7 80.0 - 100.0 fL   MCH 26.2 26.0 - 34.0 pg   MCHC 31.4 30.0 - 36.0 g/dL   RDW 82.2 (H) 88.4 - 84.4 %   Platelets 112  (L) 150 - 400 K/uL   nRBC 0.5 (H) 0.0 - 0.2 %  Cortisol  Status: None   Collection Time: 08/01/24  3:33 AM  Result Value Ref Range   Cortisol, Plasma 8.1 ug/dL  Basic metabolic panel with GFR     Status: Abnormal   Collection Time: 08/01/24 10:22 AM  Result Value Ref Range   Sodium 147 (H) 135 - 145 mmol/L   Potassium 3.6 3.5 - 5.1 mmol/L   Chloride 109 98 - 111 mmol/L   CO2 23 22 - 32 mmol/L   Glucose, Bld 78 70 - 99 mg/dL   BUN 11 6 - 20 mg/dL   Creatinine, Ser 9.28 0.44 - 1.00 mg/dL   Calcium  9.1 8.9 - 10.3 mg/dL   GFR, Estimated >39 >39 mL/min   Anion gap 15 5 - 15  Hepatic function panel     Status: Abnormal   Collection Time: 08/01/24 10:22 AM  Result Value Ref Range   Total Protein 7.5 6.5 - 8.1 g/dL   Albumin 3.3 (L) 3.5 - 5.0 g/dL   AST 28 15 - 41 U/L   ALT 25 0 - 44 U/L   Alkaline Phosphatase 188 (H) 38 - 126 U/L   Total Bilirubin 0.4 0.0 - 1.2 mg/dL   Bilirubin, Direct 0.2 0.0 - 0.2 mg/dL   Indirect Bilirubin 0.2 (L) 0.3 - 0.9 mg/dL  Magnesium      Status: None   Collection Time: 08/01/24 10:22 AM  Result Value Ref Range   Magnesium  1.8 1.7 - 2.4 mg/dL    I have reviewed pertinent nursing notes, vitals, labs, and images as necessary. I have ordered labwork to follow up on as indicated.  I have reviewed the last notes from staff over past 24 hours. I have discussed patient's care plan and test results with nursing staff, CM/SW, and other staff as appropriate.  Old records reviewed in assessment of this patient  Time spent: Greater than 50% of the 55 minute visit was spent in counseling/coordination of care for the patient as laid out in the A&P.   LOS: 0 days   Alm Apo, MD [facility] 08/01/2024, 11:56 AM "

## 2024-08-01 NOTE — Plan of Care (Signed)
  Problem: Clinical Measurements: Goal: Respiratory complications will improve Outcome: Progressing   Problem: Nutrition: Goal: Adequate nutrition will be maintained Outcome: Progressing   Problem: Elimination: Goal: Will not experience complications related to bowel motility Outcome: Progressing   Problem: Pain Managment: Goal: General experience of comfort will improve and/or be controlled Outcome: Progressing

## 2024-08-01 NOTE — Hospital Course (Addendum)
 Patricia White is a 53 y.o. female with medical history significant for cerebral palsy, intellectual disability, seizure disorder, and hyperlipidemia who was sent to the ED from her nursing facility for altered mental status.   Patient is reportedly not eating or drinking much and has had decreased level of consciousness for the past 2 days.  At her baseline, she is said to be mostly nonverbal but alert and interactive.   In the ER she was found to be hypothermic, 92.9 F.  Tachycardic in the 110s.  She underwent workup for infectious etiology and admitted for ongoing monitoring.  Of note, she was hospitalized in September 2025 for similar presentation with altered mentation and hypothermia.  She was evaluated by neurology at that time as well and underwent testing with EEG.  She is felt to have baseline abnormal EEG and treatment has been centered around treating her clinically instead per neurology.  She did have supratherapeutic Dilantin  level at that time and medication was held until level decreased.  Mentation slowly returned to baseline with no significant intervention during that hospitalization.  Assessment/Plan:   Acute metabolic encephalopathy Seizure disorder Cerebral palsy/IDD - Again presents with similar episode compared to September.  Decreased level of consciousness and not eating/drinking much.  Hypothermic in the ER, 92.9 F - Dilantin  level was not as elevated as previously in September, 20.6 on admission - EEG initially ordered after admission but after discussion with neurology, now being canceled since abnormal in the past - Continue Keppra , changed to IV - Home clonazepam  changed to Ativan  IV until safe for oral intake - Dilantin  on hold for now until seen by neurology - Continue Vimpat  - Perampanel  tabs ordered but patient unable to take oral at this time - continue IVF - NPO for now until safe for PO again  SIRS - hypothermic, WBC 12, tachycardia, tachypnea,  normal lactic acid - Suspect volume depletion contributing and possibly breakthrough seizure - no obvious infection discovered on workup thus far: UA negative, CT head unremarkable, CXR negative, blood cultures drawn and negative so far, negative PCT as well - continue IVF - continue monitoring off abx at this time   Prolonged QTc - Continue trending electrolytes and replete as needed

## 2024-08-01 NOTE — Progress Notes (Signed)
 Lab called to say patients labs had to be recollected, labs re timed for 0600, waiting on phlebotomist to come drawl labs.

## 2024-08-01 NOTE — TOC Initial Note (Signed)
 Transition of Care [facility]) - Initial/Assessment Note    Patient Details  Name: Patricia White MRN: [id number] Date of Birth: December 01, 1970  [facility] CM/SW Contact:    Jon ONEIDA Anon, RN Phone Number: 08/01/2024, 2:21 PM  Clinical Narrative:                 Pt is from [facility] for LTC. Pt brought in via EMS for altered mental status. Pt disoriented x4 at this time. Pt has Legal Guardian, appointed through [facility] DSS. RNCM spoke with Legal Guardian [name] Irving via telephone at [phone] and she states that once pt is medically stable she is to return to [facility] to continue LTC. Spoke with Admissions Coordinator Donnamarie at [facility] and he states pt can return at time of DC. Will need ambulance transport at DC. ICM will continue to follow for DC planning needs.     Expected Discharge Plan: Long Term Nursing Home Barriers to Discharge: Continued Medical Work up   Patient Goals and CMS Choice Patient states their goals for this hospitalization and ongoing recovery are:: Return to [facility] for LTC [email or web address] Compare Post Acute Care list provided to:: Legal Guardian (Spoke with Legal Guardian [name] Irving at [phone]) Choice offered to / list presented to : [facility] POA / Guardian Bryant ownership interest in [facility]provided to:: [facility] POA / Guardian    Expected Discharge Plan and Services In-house Referral: NA Discharge Planning Services: CM Consult Post Acute Care Choice: Nursing Home Living arrangements for the past 2 months: Skilled Nursing Facility                 DME Arranged: N/A DME Agency: NA       HH Arranged: NA HH Agency: NA        Prior Living Arrangements/Services Living arrangements for the past 2 months: Skilled Nursing Facility Lives with:: Facility Resident Patient language and need for interpreter reviewed:: Yes Do you feel safe going back to the place where you live?:  (UTA, Pt disoriented x4)       Need for Family Participation in Patient Care: Yes (Comment) Care giver support system in place?: Yes (comment) Current home services: DME Criminal Activity/Legal Involvement Pertinent to Current Situation/Hospitalization: No - Comment as needed  Activities of Daily Living   ADL Screening (condition at time of admission) Independently performs ADLs?: No Does the patient have a NEW difficulty with bathing/dressing/toileting/self-feeding that is expected to last >3 days?: No Does the patient have a NEW difficulty with getting in/out of bed, walking, or climbing stairs that is expected to last >3 days?: No Does the patient have a NEW difficulty with communication that is expected to last >3 days?: No Is the patient deaf or have difficulty hearing?: No Does the patient have difficulty seeing, even when wearing glasses/contacts?: No Does the patient have difficulty concentrating, remembering, or making decisions?: Yes  Permission Sought/Granted Permission sought to share information with : Guardian, Facility Medical Sales Representative    Share Information with NAME: Irving [name]  Legal Guardian, Emergency Contact  [phone]  Permission granted to share info w AGENCY: [facility]        Emotional Assessment Appearance:: Other (Comment Required (UTA) Attitude/Demeanor/Rapport: Unable to Assess Affect (typically observed): Unable to Assess Orientation: :  (Pt disoriented x4) Alcohol / Substance Use: Not Applicable Psych Involvement: No (comment)  Admission diagnosis:  Somnolence [R40.0] Acute encephalopathy [G93.40] Patient Active Problem List   Diagnosis Date Noted  Acute metabolic encephalopathy 07/31/2024   Prolonged QT interval 07/31/2024   Elevated phenytoin  level 05/05/2024   AMS (altered mental status) 05/02/2024   Sepsis (HCC) 02/06/2024   SIRS (systemic inflammatory response syndrome) (HCC) 02/05/2024   Blood per rectum 01/02/2024   Ulcer of upper extremity  12/31/2023   Facial swelling 12/30/2023   Open wound of left upper arm 12/29/2023   Sinus tachycardia 12/29/2023   Pressure injury of skin of left elbow 12/29/2023   Intellectual disability 12/28/2023   Cerebral palsy (HCC) 12/28/2023   Infestation by bed bug 12/27/2023   Seizure-like activity (HCC) 12/27/2023   Left arm pain 11/24/2023   Hypotension 11/23/2023   Left arm swelling 11/23/2023   Status epilepticus (HCC) 11/19/2023   Pancytopenia (HCC) 11/11/2023   Hypomagnesemia 11/09/2023   Dehydration 11/08/2023   Gastritis and gastroduodenitis 11/08/2023   Gastric polyp 11/08/2023   Nausea without vomiting 11/08/2023   Protein-calorie malnutrition, severe 11/08/2023   Decreased appetite 11/05/2023   Lower urinary tract infectious disease 11/01/2023   Chronic health problem 11/01/2023   Oral thrush 11/01/2023   FTT (failure to thrive) in adult 11/01/2023   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   Hot flashes due to menopause 06/30/2021   Hyperlipidemia associated with type 2 diabetes mellitus (HCC) 11/24/2020   Diabetes mellitus without complication (HCC) 11/23/2020   Angioedema 12/10/2019   Lesion of mandible 12/10/2019   Seasonal allergies 11/20/2019   Essential hypertension 10/23/2019   Seizure disorder (HCC) 02/23/2016   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 [place] [address] Phone: [phone] Fax: [phone]     Social Drivers of Health (SDOH) Social History: SDOH Screenings   Food Insecurity: Patient Unable To Answer (08/01/2024)  Housing: Patient Unable To Answer (08/01/2024)  Transportation Needs: Patient Unable To Answer (08/01/2024)  Utilities: Patient Unable To Answer (05/03/2024)  Alcohol  Screen: Low Risk (03/24/2023)  Depression (PHQ2-9): Low Risk (08/09/2023)  Financial Resource Strain: Low Risk (03/24/2023)  Physical Activity: Insufficiently Active (03/24/2023)  Social Connections: Moderately Isolated (11/19/2023)  Stress: No Stress Concern Present (03/24/2023)  Tobacco Use: Low Risk (07/31/2024)  Health Literacy: Adequate Health Literacy (03/24/2023)   SDOH Interventions:     Readmission Risk Interventions    11/16/2023   11:48 AM  Readmission Risk Prevention Plan  Transportation Screening Complete  PCP or Specialist Appt within 5-7 Days Complete  Home Care Screening Complete  Medication Review (RN CM) Complete

## 2024-08-01 NOTE — Consult Note (Signed)
 NEUROLOGY CONSULT NOTE   Date of service: August 01, 2024 Patient Name: Patricia White MRN:  [id number] DOB:  05/26/1971 Chief Complaint: altered mental status Requesting Provider: Patsy Lenis, MD  History of Present Illness  Patricia White is a 53 y.o. female with hx of Seizures, Cerebral Palsy, left spastic paresis who presented with altered mental status x2 days per SNF staff, who state they are unsure of her baseline mentation. Chart review notes shows evidence of baseline cognitive impairment.   On arrival to ED, she was somnolent with pinpoint pupils, hypothermic. She was unresponsive to voice or pain. WBC slightly elevated at 12--improved to WNL this AM, LA elevated 1.7, CTH negative, CXR negative, intermittent wet cough, Resp Panel negative. Ketones/Protein in UA. Given cefepime  x 1 in ED.   On my exam today, she opens her eyes to voice and mumbles Hey in response. She follows simple commands (wiggle toes, smile) with baseline left spastic paresis. She nods appropriately to simple questions (is your name [name]? Do you have a headache? Are you at home? Etc).   I spoke to  Bartley, press photographer at [facility]. She described patient's baseline status as very minimally verbal with mostly grunts, tracking examiner, left weakness, follows commands. She said she is often active with her right side and moves in the bed a lot. Overall, it seems like she is not quite back to baseline, as she is a little drowsy, but she is definitely improved from the ED exam. [facility] number is [phone].  Home AED meds include:  Keppra  1000mg  AM, 1500mg  HS  Vimpat  200mg  BID  Dilantin  100mg  BID (not continued currently) Phenytoin  level very slightly elevated, not enough to cause this level of mental status change.  perampanel  4mg  daily Klonopin  0.5mg  TID   Admitted 12/2023 with ongoing status. Patient's mental status and overall presentation improved while EEG continued to show status  epilepticus. Electronic status was thought to be her baseline for her due to static encephalopathy.   11/2023 admission: low levels of Depakote  and carbamazepine . At this time, grandmother stated she gave medications to patient.   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   Seizures (HCC)     Past Surgical History:  Procedure Laterality Date   ESOPHAGOGASTRODUODENOSCOPY N/A 11/08/2023   Procedure: EGD (ESOPHAGOGASTRODUODENOSCOPY);  Surgeon: San Sandor GAILS, DO;  Location: [facility] ENDOSCOPY;  Service: Gastroenterology;  Laterality: N/A;   POLYPECTOMY  11/08/2023   Procedure: POLYPECTOMY;  Surgeon: San Sandor GAILS, DO;  Location: [facility] ENDOSCOPY;  Service: Gastroenterology;;    Family History: Family History  Problem Relation Age of Onset   Migraines Mother    Cancer Mother        liver cancer   Diabetes Maternal Aunt    Hypertension Maternal Grandmother    Colon cancer Maternal Grandfather 60   Prostate cancer Maternal Grandfather    Throat cancer Maternal Grandfather     Social History  reports that she has never smoked. She has never used smokeless tobacco. She reports that she does not drink alcohol and does not use drugs.  Allergies[1]  Medications  Current Medications[2]  Vitals   Vitals:   08/01/24 0405 08/01/24 0600 08/01/24 0700 08/01/24 0800  BP: (!) 101/53 (!) 102/55 112/63 (!) 106/56  Pulse: (!) 104 (!) 102 ROLLEN)  104 (!) 106  Resp: 14 15 13 15   Temp:      TempSrc:      SpO2: 100% 96% 99% 96%  Weight:      Height:        Body mass index is 24.03 kg/m.   Physical Exam   Constitutional: Appears chronically ill Cardiovascular: S1S2 Respiratory: Effort normal, non-labored breathing.   Neurologic Examination   Patient opens eyes to voice. Moans/Grunts Hey. Nods appropriately to questions.   Follows simple commands.  Chronic left spastic paresis.   Labs/Imaging/Neurodiagnostic studies   CBC:  Recent Labs  Lab 08-02-24 1511 08/01/24 0333  WBC 12.0* 8.5  NEUTROABS 10.7*  --   HGB 11.6* 10.6*  HCT 37.7 33.8*  MCV 85.7 83.7  PLT 184 112*   Basic Metabolic Panel:  Lab Results  Component Value Date   NA 145 Aug 02, 2024   K 4.5 August 02, 2024   CO2 24 02-Aug-2024   GLUCOSE 105 (H) 08-02-24   BUN 15 08/02/2024   CREATININE 0.66 08/02/24   CALCIUM  10.2 Aug 02, 2024   GFRNONAA >60 08-02-2024   GFRAA >60 01/19/2020   Lipid Panel:  Lab Results  Component Value Date   LDLCALC 46 02/14/2024   HgbA1c:  Lab Results  Component Value Date   HGBA1C 5.3 11/23/2023   Urine Drug Screen: No results found for: LABOPIA, COCAINSCRNUR, LABBENZ, AMPHETMU, THCU, LABBARB  Alcohol Level No results found for: [facility] INR  Lab Results  Component Value Date   INR 1.0 August 02, 2024   APTT  Lab Results  Component Value Date   APTT 28 09/14/2019   AED levels:  Lab Results  Component Value Date   PHENYTOIN  20.6 (H) 02-Aug-2024   LEVETIRACETA 5.4 (L) 02/05/2024    12/18 CT Head without contrast(Personally reviewed): No acute intracranial abnormality   11/16 CT Head without contrast(Personally reviewed): No acute intracranial abnormality.   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 Additionally there was severe diffuse encephalopathy. Of note, patient has had similar EEG size in past with gradual improvement in mental status without much improvement in EEG. Therefore, it is likely that this is baseline  EEG for this patient.  ASSESSMENT   Patricia White is a 53 y.o. female who presented with altered mental status x2 days per staff, who state they are unsure of her baseline mentation. Chart review notes shows evidence of baseline cognitive impairment. On arrival to ED,  she was somnolent with pinpoint pupils.   On exam today, patient is drowsy but alert. She seems to be much improved from yesterday's ED assessment. With her improvement in mental status and her history of consistent EEGs despite her mental status, do not think a routine EEG is warranted at this time.   RECOMMENDATIONS   - cancel routine EEG  -Continue home antiepileptics ______________________________________________________________________    Signed, Rocky JAYSON Likes, NP [facility]    I have seen the patient and reviewed the above note.  We will need to monitor and treat clinically.  She has had multiple EEGs that are typically very abnormal and I am not certain that this would change management at the current time.  Her Dilantin  level was slightly high yesterday, but today it is come down and therefore I will resume her home medication and continue to follow levels.  It would be helpful to know what her routine outpatient levels are, as they have varied widely in the past, but  with her now in the therapeutic range I will restart it.  Aisha Seals, MD [facility]   If 7pm- 7am, please page neurology on call as listed in AMION.     [1] No Known Allergies [2]  Current Facility-Administered Medications:    0.9 %  sodium chloride  infusion, , Intravenous, Continuous, Opyd, Evalene RAMAN, MD, Last Rate: 75 mL/hr at 08/01/24 0842, Infusion Verify at 08/01/24 9157   acetaminophen  (TYLENOL ) tablet 650 mg, 650 mg, Oral, Q6H PRN **OR** acetaminophen  (TYLENOL ) suppository 650 mg, 650 mg, Rectal, Q6H PRN, Opyd, Evalene RAMAN, MD   Chlorhexidine  Gluconate Cloth 2 % PADS 6 each, 6 each, Topical, Daily, Opyd, Timothy S, MD   enoxaparin  (LOVENOX ) injection 40 mg, 40 mg, Subcutaneous, Q24H, Opyd, Timothy S, MD   lacosamide  (VIMPAT ) 200 mg in sodium chloride  0.9 % 25 mL IVPB, 200 mg, Intravenous, Q12H, Opyd, Timothy S, MD, Stopped at 07/31/24 2327   levETIRAcetam  (KEPPRA )  undiluted injection 1,000 mg, 1,000 mg, Intravenous, q morning, Opyd, Timothy S, MD   levETIRAcetam  (KEPPRA ) undiluted injection 1,500 mg, 1,500 mg, Intravenous, QHS, Opyd, Timothy S, MD, 1,500 mg at 07/31/24 2229   LORazepam  (ATIVAN ) injection 1 mg, 1 mg, Intravenous, TID, Patsy Lenis, MD   mupirocin  ointment (BACTROBAN ) 2 %, , Nasal, BID, Girguis, David, MD   nystatin  (MYCOSTATIN /NYSTOP ) topical powder 1 Application, 1 Application, Topical, BID, Opyd, Timothy S, MD   Oral care mouth rinse, 15 mL, Mouth Rinse, PRN, Opyd, Timothy S, MD   Oral care mouth rinse, 15 mL, Mouth Rinse, 4 times per day, Opyd, Evalene RAMAN, MD   Perampanel  TABS 4 mg, 4 mg, Oral, Daily, Opyd, Timothy S, MD   sodium chloride  flush (NS) 0.9 % injection 3 mL, 3 mL, Intravenous, Q12H, Opyd, Evalene RAMAN, MD, 3 mL at 07/31/24 2231

## 2024-08-01 NOTE — Plan of Care (Signed)
 Admission completed with use of chart from SNF,assessment documented, IVF infusing, bed in lowest locked position with bed alarm on and side rails up and call bell in reach.  Problem: Education: Goal: Knowledge of General Education information will improve Description: Including pain rating scale, medication(s)/side effects and non-pharmacologic comfort measures 08/01/2024 0246 by Arrie Luke NOVAK, RN Outcome: Progressing 08/01/2024 0246 by Arrie Luke NOVAK, RN Outcome: Progressing

## 2024-08-02 DIAGNOSIS — R338 Other retention of urine: Secondary | ICD-10-CM | POA: Diagnosis not present

## 2024-08-02 DIAGNOSIS — G9341 Metabolic encephalopathy: Secondary | ICD-10-CM | POA: Diagnosis not present

## 2024-08-02 DIAGNOSIS — R4189 Other symptoms and signs involving cognitive functions and awareness: Secondary | ICD-10-CM | POA: Diagnosis not present

## 2024-08-02 DIAGNOSIS — R7989 Other specified abnormal findings of blood chemistry: Secondary | ICD-10-CM

## 2024-08-02 DIAGNOSIS — R6510 Systemic inflammatory response syndrome (SIRS) of non-infectious origin without acute organ dysfunction: Secondary | ICD-10-CM | POA: Diagnosis not present

## 2024-08-02 LAB — CBC WITH DIFFERENTIAL/PLATELET
Abs Immature Granulocytes: 0.03 K/uL (ref 0.00–0.07)
Basophils Absolute: 0 K/uL (ref 0.0–0.1)
Basophils Relative: 0 %
Eosinophils Absolute: 0.3 K/uL (ref 0.0–0.5)
Eosinophils Relative: 4 %
HCT: 29.1 % — ABNORMAL LOW (ref 36.0–46.0)
Hemoglobin: 8.9 g/dL — ABNORMAL LOW (ref 12.0–15.0)
Immature Granulocytes: 1 %
Lymphocytes Relative: 25 %
Lymphs Abs: 1.6 K/uL (ref 0.7–4.0)
MCH: 25.8 pg — ABNORMAL LOW (ref 26.0–34.0)
MCHC: 30.6 g/dL (ref 30.0–36.0)
MCV: 84.3 fL (ref 80.0–100.0)
Monocytes Absolute: 0.5 K/uL (ref 0.1–1.0)
Monocytes Relative: 8 %
Neutro Abs: 4.1 K/uL (ref 1.7–7.7)
Neutrophils Relative %: 62 %
Platelets: 121 K/uL — ABNORMAL LOW (ref 150–400)
RBC: 3.45 MIL/uL — ABNORMAL LOW (ref 3.87–5.11)
RDW: 17.8 % — ABNORMAL HIGH (ref 11.5–15.5)
WBC: 6.5 K/uL (ref 4.0–10.5)
nRBC: 0.5 % — ABNORMAL HIGH (ref 0.0–0.2)

## 2024-08-02 LAB — HEPATIC FUNCTION PANEL
ALT: 25 U/L (ref 0–44)
AST: 32 U/L (ref 15–41)
Albumin: 3.7 g/dL (ref 3.5–5.0)
Alkaline Phosphatase: 187 U/L — ABNORMAL HIGH (ref 38–126)
Bilirubin, Direct: 0.2 mg/dL (ref 0.0–0.2)
Indirect Bilirubin: 0.2 mg/dL — ABNORMAL LOW (ref 0.3–0.9)
Total Bilirubin: 0.3 mg/dL (ref 0.0–1.2)
Total Protein: 8.2 g/dL — ABNORMAL HIGH (ref 6.5–8.1)

## 2024-08-02 LAB — MAGNESIUM: Magnesium: 1.7 mg/dL (ref 1.7–2.4)

## 2024-08-02 LAB — BASIC METABOLIC PANEL WITH GFR
Anion gap: 16 — ABNORMAL HIGH (ref 5–15)
BUN: 9 mg/dL (ref 6–20)
CO2: 23 mmol/L (ref 22–32)
Calcium: 9.5 mg/dL (ref 8.9–10.3)
Chloride: 107 mmol/L (ref 98–111)
Creatinine, Ser: 0.64 mg/dL (ref 0.44–1.00)
GFR, Estimated: >60 mL/min
Glucose, Bld: 80 mg/dL (ref 70–99)
Potassium: 3.3 mmol/L — ABNORMAL LOW (ref 3.5–5.1)
Sodium: 146 mmol/L — ABNORMAL HIGH (ref 135–145)

## 2024-08-02 LAB — PHENYTOIN LEVEL, TOTAL: Phenytoin Lvl: 21.2 ug/mL — ABNORMAL HIGH (ref 10.0–20.0)

## 2024-08-02 MED ORDER — LACTATED RINGERS IV SOLN: INTRAVENOUS | Status: DC

## 2024-08-02 NOTE — Progress Notes (Signed)
 NEUROLOGY CONSULT FOLLOW UP NOTE   Date of service: August 02, 2024 Patient Name: Patricia White MRN:  [id number] DOB:  03/04/1971  Interval Hx/subjective   Patient alert and following commands. RN and attending at bedside.   Dilantin  level improved yesterday, was restarted, today's level come back elevated.   Vitals   Vitals:   08/02/24 0400 08/02/24 0500 08/02/24 0600 08/02/24 0700  BP: 122/81 (!) 140/95 137/88 (!) 147/94  Pulse: 92 86 96 87  Resp: 13 12 18 11   Temp:      TempSrc:      SpO2: 93% 99% 99% 100%  Weight:      Height:         Body mass index is 24.03 kg/m.  Physical Exam   Constitutional: Appears chronically ill Cardiovascular: S1S2 Respiratory: Effort normal, non-labored breathing.   Neurologic Examination   Patient opens eyes to voice. Moans/Grunts Hey. Nods appropriately to questions.  Follows simple commands.  Chronic left spastic paresis.   Medications Current Medications[1]  Labs and Diagnostic Imaging   CBC:  Recent Labs  Lab 07/31/24 1511 08/01/24 0333 08/02/24 0317  WBC 12.0* 8.5 6.5  NEUTROABS 10.7*  --  4.1  HGB 11.6* 10.6* 8.9*  HCT 37.7 33.8* 29.1*  MCV 85.7 83.7 84.3  PLT 184 112* 121*    Basic Metabolic Panel:  Lab Results  Component Value Date   NA 146 (H) 08/02/2024   K 3.3 (L) 08/02/2024   CO2 23 08/02/2024   GLUCOSE 80 08/02/2024   BUN 9 08/02/2024   CREATININE 0.64 08/02/2024   CALCIUM  9.5 08/02/2024   GFRNONAA >60 08/02/2024   GFRAA >60 01/19/2020   Lipid Panel:  Lab Results  Component Value Date   LDLCALC 46 02/14/2024   HgbA1c:  Lab Results  Component Value Date   HGBA1C 5.3 11/23/2023   Urine Drug Screen: No results found for: LABOPIA, COCAINSCRNUR, LABBENZ, AMPHETMU, THCU, LABBARB  Alcohol Level No results found for: [facility] INR  Lab Results  Component Value Date   INR 1.0 07/31/2024   APTT  Lab Results  Component Value Date   APTT 28 09/14/2019   AED levels:  Lab Results   Component Value Date   PHENYTOIN  21.2 (H) 08/02/2024   LEVETIRACETA 5.4 (L) 02/05/2024    12/18 CT Head without contrast(Personally reviewed): No acute intracranial abnormality    11/16 CT Head without contrast(Personally reviewed): No acute intracranial abnormality.    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 Additionally there was severe diffuse encephalopathy. Of note, patient has had similar EEG size in past with gradual improvement in mental status without much improvement in EEG. Therefore, it is likely that this is baseline  EEG for this patient.  Assessment   Patricia White is a 53 y.o. female who presented with altered mental status x2 days per staff, who state they are unsure of her baseline mentation. Chart review notes shows evidence of baseline cognitive impairment. On arrival to ED, she was somnolent with pinpoint pupils.    On exam today, she is alert, follows simple commands, moving around in bed with bilateral mitts on. Based on my conversation with SNF staff yesterday, this seems to be her baseline.   Patient has had multiple abnormal EEGs that are not likely to change with any additional treatment. Dilantin  level improved and her home dose was restarted yesterday.  Level came back elevated today. She has had  varying levels in the past, will also try and see if we have access to her outpatient testing levels.   Recommendations   - continue home AEDs  Will possibly need to alter her Depakote  dosing some, based on continued varying levels. Pharmacy consult ordered.  - continue to follow levels  Plan discussed with Dr. Patsy ______________________________________________________________________   Signed, Rocky JAYSON Likes, NP [facility]      [1]  Current Facility-Administered Medications:    acetaminophen  (TYLENOL ) tablet 650 mg, 650 mg, Oral, Q6H  PRN **OR** acetaminophen  (TYLENOL ) suppository 650 mg, 650 mg, Rectal, Q6H PRN, Opyd, Timothy S, MD, 650 mg at 08/02/24 0102   Chlorhexidine  Gluconate Cloth 2 % PADS 6 each, 6 each, Topical, Daily, Opyd, Evalene RAMAN, MD, 6 each at 08/01/24 2240   enoxaparin  (LOVENOX ) injection 40 mg, 40 mg, Subcutaneous, Q24H, Opyd, Timothy S, MD, 40 mg at 08/01/24 9046   lacosamide  (VIMPAT ) 200 mg in sodium chloride  0.9 % 25 mL IVPB, 200 mg, Intravenous, Q12H, Opyd, Timothy S, MD, Stopped at 08/01/24 2158   levETIRAcetam  (KEPPRA ) undiluted injection 1,000 mg, 1,000 mg, Intravenous, q morning, Opyd, Timothy S, MD, 1,000 mg at 08/01/24 9045   levETIRAcetam  (KEPPRA ) undiluted injection 1,500 mg, 1,500 mg, Intravenous, QHS, Opyd, Timothy S, MD, 1,500 mg at 08/01/24 2142   LORazepam  (ATIVAN ) injection 1 mg, 1 mg, Intravenous, TID, Patsy Lenis, MD, 1 mg at 08/01/24 2238   mupirocin  ointment (BACTROBAN ) 2 %, , Nasal, BID, Girguis, David, MD, Given at 08/01/24 2129   nystatin  (MYCOSTATIN /NYSTOP ) topical powder 1 Application, 1 Application, Topical, BID, Opyd, Evalene RAMAN, MD, 1 Application at 08/01/24 2241   Oral care mouth rinse, 15 mL, Mouth Rinse, PRN, Opyd, Timothy S, MD   Oral care mouth rinse, 15 mL, Mouth Rinse, 4 times per day, Opyd, Evalene RAMAN, MD, 15 mL at 08/01/24 2129   perampanel  (FYCOMPA ) tablet 4 mg, 4 mg, Oral, Daily, Opyd, Timothy S, MD   sodium chloride  flush (NS) 0.9 % injection 3 mL, 3 mL, Intravenous, Q12H, Opyd, Evalene RAMAN, MD, 3 mL at 08/01/24 2128

## 2024-08-02 NOTE — Progress Notes (Signed)
 Phenytoin  Initial Consult Indication: h/o seizures Returns to hospital with borderline to slightly high to high normal phenytoin  levels and AMS.  Allergies[1]  Patient Measurements: Height: 5' 5 (165.1 cm) Weight: 65.5 kg (144 lb 6.4 oz) IBW/kg (Calculated) : 57 TPN AdjBW (KG): 65.5 Body mass index is 24.03 kg/m.   Vital signs: Temp: 97.2 F (36.2 C) (12/20 0127) Temp Source: Axillary (12/20 0127) BP: 123/88 (12/20 0900) Pulse Rate: 91 (12/20 0900)  Labs: Lab Results  Component Value Date/Time   Albumin 3.7 08/02/2024 0802   Phenytoin  Lvl 21.2 (H) 08/02/2024 0317   Phenytoin  Lvl 18.8 08/01/2024 1829   Lab Results  Component Value Date   PHENYTOIN  21.2 (H) 08/02/2024   VALPROATE <10 (L) 11/19/2023   CBMZ 2.3 (L) 11/19/2023   Estimated Creatinine Clearance: 74.9 mL/min (by C-G formula based on SCr of 0.64 mg/dL).    Assessment: Measured phenytoin  level up to 21.2, Albumin 3.7 (fluctuates) Corrected phenytoin  level (if needed): 19 Seizure activity: None Significant potential drug interactions: None  Goals of care:  Total phenytoin  level: 10-20 mcg/ml Free phenytoin  level: 1-2 mcg/ml  Plan:  Check phenytoin  level 12/21. If <=20, can start new dose below. The recommended adjustment for high phenytoin  levels of 20-30 is to decrease the original dose by 25mg . Anticipated maintenance dose: 75mg  q AM, 100mg  q PM Pharmacy will continue to follow regarding obtaining total phenytoin  levels and dose adjustments as indicated. Recheck level in 5-7 days.   Tiwanna Tuch Karoline Marina, PharmD, BCPS Clinical Staff Pharmacist  Marina, Rillie Riffel Stillinger 08/02/2024,11:25 AM    [1] No Known Allergies

## 2024-08-02 NOTE — Progress Notes (Signed)
 " Progress Note    Patricia White   FMW:[id number]  DOB: [birth year]  DOA: 07/31/2024     1 PCP: Alba Sharper, MD  Initial CC: Altered mentation  Hospital Course: Patricia White is a 53 y.o. female with medical history significant for cerebral palsy, intellectual disability, seizure disorder, and hyperlipidemia who was sent to the ED from her nursing facility for altered mental status.   Patient is reportedly not eating or drinking much and has had decreased level of consciousness for the past 2 days.  At her baseline, she is said to be mostly nonverbal but alert and interactive.   In the ER she was found to be hypothermic, 92.9 F.  Tachycardic in the 110s.  She underwent workup for infectious etiology and admitted for ongoing monitoring.  Of note, she was hospitalized in September 2025 for similar presentation with altered mentation and hypothermia.  She was evaluated by neurology at that time as well and underwent testing with EEG.  She is felt to have baseline abnormal EEG and treatment has been centered around treating her clinically instead per neurology.  She did have supratherapeutic Dilantin  level at that time and medication was held until level decreased.  Mentation slowly returned to baseline with no significant intervention during that hospitalization.  Assessment/Plan:   Acute metabolic encephalopathy Seizure disorder Cerebral palsy/IDD - Again presents with similar episode compared to September.  Decreased level of consciousness and not eating/drinking much.  Hypothermic in the ER, 92.9 F - Total dilantin  level was not as elevated as previously in September, 20.6 on admission - EEG initially ordered after admission but after discussion with neurology, now being canceled since abnormal in the past - Continue Keppra , changed to IV - Home clonazepam  changed to Ativan  IV until safe for oral intake - Dilantin  on hold for now until seen by neurology; now resumed per  pharmacy - checking free dilantin  level this am, 12/20 also - Continue Vimpat  - Perampanel  tabs ordered but patient unable to take oral at this time - continue IVF - NPO for now until safe for PO again; follow up SLP eval   Acute urinary retention - Developed retention likely in setting of overall deconditioning and illness - Continue bladder scans for now and will straight cath as needed  SIRS - hypothermic, WBC 12, tachycardia, tachypnea, normal lactic acid - Suspect volume depletion contributing and possibly breakthrough seizure - no obvious infection discovered on workup thus far: UA negative, CT head unremarkable, CXR negative, blood cultures drawn and negative so far, negative PCT as well - continue IVF - continue monitoring off abx at this time   Prolonged QTc - Continue trending electrolytes and replete as needed  Interval History:  No events overnight.  Mentation seems to have improved further since yesterday and she appears back to her baseline.  She is restless in bed which is typical for her and following all commands and moving all 4 extremities now, better on the right side. Still having some urinary retention and have yet to initiate a diet.  Antimicrobials: N/a  DVT prophylaxis:  enoxaparin  (LOVENOX ) injection 40 mg Start: 08/01/24 1000   Code Status:   Code Status: Full Code  Mobility Assessment (Last 72 Hours)     Mobility Assessment     Row Name 08/02/24 1003 08/01/24 0800 08/01/24 0200       Does the patient have exclusion criteria? Yes- Hold (Level 0) - Assessment complete Yes- Bedfast (Level 1) - Select exclusion criteria  in next row Yes- Bedfast (Level 1) - Select exclusion criteria in next row     Mobility Assessment Exclusion Criteria -- No exclusion criteria present, perform mobility assessment No exclusion criteria present, perform mobility assessment     What is the highest level of mobility based on the mobility assessment? -- Level 1 (Bedfast)  - Unable to balance while sitting on edge of bed Level 1 (Bedfast) - Unable to balance while sitting on edge of bed     Is the above level different from baseline mobility prior to current illness? -- No - Consider discontinuing PT/OT Yes - Recommend PT order        Diet: Diet Orders (From admission, onward)     Start     Ordered   07/31/24 2208  Diet NPO time specified Except for: Sips with Meds  Diet effective now       Comments: She can have a regular diet once more alert  Question:  Except for  Answer:  Sips with Meds   07/31/24 2211            Barriers to discharge: none Disposition Plan:  Facility HH orders placed: n/a Status is: Inpatient  Objective: Blood pressure 123/88, pulse 91, temperature (!) 97.2 F (36.2 C), temperature source Axillary, resp. rate 14, height 5' 5 (1.651 m), weight 65.5 kg, SpO2 99%.  Examination:  Physical Exam Constitutional:      Comments: Resting comfortably in bed and now following commands and moving all 4 extremities to command  HENT:     Head: Normocephalic and atraumatic.     Mouth/Throat:     Comments: Extremely unkempt oral mucosa and teeth Eyes:     Extraocular Movements: Extraocular movements intact.  Cardiovascular:     Rate and Rhythm: Normal rate and regular rhythm.  Pulmonary:     Effort: Pulmonary effort is normal. No respiratory distress.     Breath sounds: Normal breath sounds. No wheezing.  Abdominal:     General: Bowel sounds are normal. There is no distension.     Palpations: Abdomen is soft.     Tenderness: There is no abdominal tenderness.  Musculoskeletal:        General: Normal range of motion.     Cervical back: Normal range of motion and neck supple.  Skin:    General: Skin is warm and dry.  Neurological:     Comments: Essentially nonverbal which is baseline.  Following commands and moving all 4 extremities      Consultants:  Neurology  Procedures:    Data Reviewed: Results for orders placed  or performed during the hospital encounter of 07/31/24 (from the past 24 hours)  Phenytoin  level, total     Status: None   Collection Time: 08/01/24  6:29 PM  Result Value Ref Range   Phenytoin  Lvl 18.8 10.0 - 20.0 ug/mL  Basic metabolic panel with GFR     Status: Abnormal   Collection Time: 08/02/24  3:17 AM  Result Value Ref Range   Sodium 146 (H) 135 - 145 mmol/L   Potassium 3.3 (L) 3.5 - 5.1 mmol/L   Chloride 107 98 - 111 mmol/L   CO2 23 22 - 32 mmol/L   Glucose, Bld 80 70 - 99 mg/dL   BUN 9 6 - 20 mg/dL   Creatinine, Ser 9.35 0.44 - 1.00 mg/dL   Calcium  9.5 8.9 - 10.3 mg/dL   GFR, Estimated >39 >39 mL/min   Anion gap 16 (H) 5 -  15  CBC with Differential/Platelet     Status: Abnormal   Collection Time: 08/02/24  3:17 AM  Result Value Ref Range   WBC 6.5 4.0 - 10.5 K/uL   RBC 3.45 (L) 3.87 - 5.11 MIL/uL   Hemoglobin 8.9 (L) 12.0 - 15.0 g/dL   HCT 70.8 (L) 63.9 - 53.9 %   MCV 84.3 80.0 - 100.0 fL   MCH 25.8 (L) 26.0 - 34.0 pg   MCHC 30.6 30.0 - 36.0 g/dL   RDW 82.1 (H) 88.4 - 84.4 %   Platelets 121 (L) 150 - 400 K/uL   nRBC 0.5 (H) 0.0 - 0.2 %   Neutrophils Relative % 62 %   Neutro Abs 4.1 1.7 - 7.7 K/uL   Lymphocytes Relative 25 %   Lymphs Abs 1.6 0.7 - 4.0 K/uL   Monocytes Relative 8 %   Monocytes Absolute 0.5 0.1 - 1.0 K/uL   Eosinophils Relative 4 %   Eosinophils Absolute 0.3 0.0 - 0.5 K/uL   Basophils Relative 0 %   Basophils Absolute 0.0 0.0 - 0.1 K/uL   Immature Granulocytes 1 %   Abs Immature Granulocytes 0.03 0.00 - 0.07 K/uL  Magnesium      Status: None   Collection Time: 08/02/24  3:17 AM  Result Value Ref Range   Magnesium  1.7 1.7 - 2.4 mg/dL  Phenytoin  level, total     Status: Abnormal   Collection Time: 08/02/24  3:17 AM  Result Value Ref Range   Phenytoin  Lvl 21.2 (H) 10.0 - 20.0 ug/mL  Hepatic function panel     Status: Abnormal   Collection Time: 08/02/24  8:02 AM  Result Value Ref Range   Total Protein 8.2 (H) 6.5 - 8.1 g/dL   Albumin 3.7 3.5  - 5.0 g/dL   AST 32 15 - 41 U/L   ALT 25 0 - 44 U/L   Alkaline Phosphatase 187 (H) 38 - 126 U/L   Total Bilirubin 0.3 0.0 - 1.2 mg/dL   Bilirubin, Direct 0.2 0.0 - 0.2 mg/dL   Indirect Bilirubin 0.2 (L) 0.3 - 0.9 mg/dL    I have reviewed pertinent nursing notes, vitals, labs, and images as necessary. I have ordered labwork to follow up on as indicated.  I have reviewed the last notes from staff over past 24 hours. I have discussed patient's care plan and test results with nursing staff, CM/SW, and other staff as appropriate.  Old records reviewed in assessment of this patient  Time spent: Greater than 50% of the 55 minute visit was spent in counseling/coordination of care for the patient as laid out in the A&P.   LOS: 1 day   Alm Apo, MD [facility] 08/02/2024, 1:30 PM "

## 2024-08-02 NOTE — Progress Notes (Signed)
 SLP Cancellation Note  Patient Details Name: Patricia White MRN: [id number] DOB: 25-Jan-1971   Cancelled treatment:       Reason Eval/Treat Not Completed: Patient declined, no reason specified;Other (comment) (Pt refusing POs despite multiple liquid and solid options and encouragement from SLP an RN) SLP reached out to legal guardian, [name], via phone who is to investigate with pts Aunt about preference foods to assist intake. Will continue efforts.   Based on past encounters with pt and swallowing hx, recommend dysphagia 3 (mechanical soft) and thin liquids as tolerated as pt mentation allows.   [name] MA, CCC-SLP Acute Rehabilitation Services   08/02/2024, 2:21 PM

## 2024-08-03 DIAGNOSIS — G9341 Metabolic encephalopathy: Secondary | ICD-10-CM | POA: Diagnosis not present

## 2024-08-03 DIAGNOSIS — R4182 Altered mental status, unspecified: Secondary | ICD-10-CM | POA: Diagnosis not present

## 2024-08-03 DIAGNOSIS — R4189 Other symptoms and signs involving cognitive functions and awareness: Secondary | ICD-10-CM | POA: Diagnosis not present

## 2024-08-03 DIAGNOSIS — R338 Other retention of urine: Secondary | ICD-10-CM | POA: Diagnosis not present

## 2024-08-03 DIAGNOSIS — R6510 Systemic inflammatory response syndrome (SIRS) of non-infectious origin without acute organ dysfunction: Secondary | ICD-10-CM | POA: Diagnosis not present

## 2024-08-03 LAB — COMPREHENSIVE METABOLIC PANEL WITH GFR
ALT: 23 U/L (ref 0–44)
AST: 29 U/L (ref 15–41)
Albumin: 3.3 g/dL — ABNORMAL LOW (ref 3.5–5.0)
Alkaline Phosphatase: 174 U/L — ABNORMAL HIGH (ref 38–126)
Anion gap: 8 (ref 5–15)
BUN: 8 mg/dL (ref 6–20)
CO2: 30 mmol/L (ref 22–32)
Calcium: 9.3 mg/dL (ref 8.9–10.3)
Chloride: 108 mmol/L (ref 98–111)
Creatinine, Ser: 0.47 mg/dL (ref 0.44–1.00)
GFR, Estimated: >60 mL/min
Glucose, Bld: 86 mg/dL (ref 70–99)
Potassium: 3.3 mmol/L — ABNORMAL LOW (ref 3.5–5.1)
Sodium: 146 mmol/L — ABNORMAL HIGH (ref 135–145)
Total Bilirubin: 0.3 mg/dL (ref 0.0–1.2)
Total Protein: 7.4 g/dL (ref 6.5–8.1)

## 2024-08-03 LAB — PHENYTOIN LEVEL, TOTAL: Phenytoin Lvl: 18.8 ug/mL (ref 10.0–20.0)

## 2024-08-03 MED ORDER — PHENYTOIN SODIUM EXTENDED 100 MG PO CAPS
100.0000 mg | ORAL_CAPSULE | Freq: Every day | ORAL | Status: DC
  Administered 2024-08-03: 100 mg via ORAL
  Filled 2024-08-03 (×2): qty 1

## 2024-08-03 MED ORDER — LEVETIRACETAM 500 MG PO TABS
1000.0000 mg | ORAL_TABLET | Freq: Every day | ORAL | Status: DC
  Administered 2024-08-03 – 2024-08-04 (×2): 1000 mg via ORAL
  Filled 2024-08-03 (×3): qty 2

## 2024-08-03 MED ORDER — LEVETIRACETAM 500 MG PO TABS
1500.0000 mg | ORAL_TABLET | Freq: Every day | ORAL | Status: DC
  Administered 2024-08-03: 1500 mg via ORAL
  Filled 2024-08-03: qty 3

## 2024-08-03 MED ORDER — LORAZEPAM 2 MG/ML IJ SOLN
0.5000 mg | Freq: Once | INTRAMUSCULAR | Status: AC | PRN
  Administered 2024-08-03: 0.5 mg via INTRAVENOUS
  Filled 2024-08-03: qty 1

## 2024-08-03 MED ORDER — MELATONIN 5 MG PO TABS
5.0000 mg | ORAL_TABLET | Freq: Once | ORAL | Status: AC
  Administered 2024-08-03: 5 mg via ORAL
  Filled 2024-08-03: qty 1

## 2024-08-03 MED ORDER — PHENYTOIN 50 MG PO CHEW
75.0000 mg | CHEWABLE_TABLET | Freq: Every day | ORAL | Status: DC
  Administered 2024-08-03 – 2024-08-04 (×2): 75 mg via ORAL
  Filled 2024-08-03 (×2): qty 1.5

## 2024-08-03 MED ORDER — CLONAZEPAM 0.5 MG PO TABS
0.5000 mg | ORAL_TABLET | Freq: Three times a day (TID) | ORAL | Status: DC
  Administered 2024-08-03 – 2024-08-04 (×4): 0.5 mg via ORAL
  Filled 2024-08-03 (×4): qty 1

## 2024-08-03 MED ORDER — POTASSIUM CHLORIDE 20 MEQ PO PACK
40.0000 meq | PACK | Freq: Once | ORAL | Status: AC
  Administered 2024-08-03: 40 meq via ORAL
  Filled 2024-08-03: qty 2

## 2024-08-03 MED ORDER — LACOSAMIDE 50 MG PO TABS
200.0000 mg | ORAL_TABLET | Freq: Two times a day (BID) | ORAL | Status: DC
  Administered 2024-08-03 – 2024-08-04 (×3): 200 mg via ORAL
  Filled 2024-08-03 (×3): qty 4

## 2024-08-03 NOTE — Progress Notes (Signed)
 NEUROLOGY CONSULT FOLLOW UP NOTE   Date of service: August 03, 2024 Patient Name: Patricia White MRN:  [id number] DOB:  1971/03/10  Interval Hx/subjective    Patient sitting up in bed. Awake and alert. She attempts to speak in full sentences, very garbled/moaning to incomprehensible speech. Follows simple commands.   Dilantin  restarted today per pharmacy consult, with improved level. Appreciate their assistance.   Vitals   Vitals:   08/02/24 0900 08/02/24 1947 08/02/24 2014 08/03/24 0454  BP: 123/88 (!) 146/99  126/74  Pulse: 91 79  79  Resp: 14 19  16   Temp:   (!) 93.9 F (34.4 C) (!) 93 F (33.9 C)  TempSrc:   Axillary Axillary  SpO2: 99% 99%  96%  Weight:      Height:         Body mass index is 24.03 kg/m.  Physical Exam   Constitutional: Appears chronically ill Cardiovascular: S1S2 Respiratory: Effort normal, non-labored breathing.   Neurologic Examination   Patient opens eyes to voice. Moans/Grunts/Very garbled to incomprehensible speech, attempts to speak at appropriate times.  Nods appropriately to questions.  Follows simple commands.  Chronic left spastic paresis.   Medications Current Medications[1]  Labs and Diagnostic Imaging   CBC:  Recent Labs  Lab 07/31/24 1511 08/01/24 0333 08/02/24 0317  WBC 12.0* 8.5 6.5  NEUTROABS 10.7*  --  4.1  HGB 11.6* 10.6* 8.9*  HCT 37.7 33.8* 29.1*  MCV 85.7 83.7 84.3  PLT 184 112* 121*    Basic Metabolic Panel:  Lab Results  Component Value Date   NA 146 (H) 08/03/2024   K 3.3 (L) 08/03/2024   CO2 30 08/03/2024   GLUCOSE 86 08/03/2024   BUN 8 08/03/2024   CREATININE 0.47 08/03/2024   CALCIUM  9.3 08/03/2024   GFRNONAA >60 08/03/2024   GFRAA >60 01/19/2020   Lipid Panel:  Lab Results  Component Value Date   LDLCALC 46 02/14/2024   HgbA1c:  Lab Results  Component Value Date   HGBA1C 5.3 11/23/2023   Urine Drug Screen: No results found for: LABOPIA, COCAINSCRNUR, LABBENZ, AMPHETMU,  THCU, LABBARB  Alcohol Level No results found for: [facility] INR  Lab Results  Component Value Date   INR 1.0 07/31/2024   APTT  Lab Results  Component Value Date   APTT 28 09/14/2019   AED levels:  Lab Results  Component Value Date   PHENYTOIN  18.8 08/03/2024   LEVETIRACETA 5.4 (L) 02/05/2024    12/18 CT Head without contrast(Personally reviewed): No acute intracranial abnormality    11/16 CT Head without contrast(Personally reviewed): No acute intracranial abnormality.    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 Additionally there was severe diffuse encephalopathy. Of note, patient has had similar EEG size in past with gradual improvement in mental status without much improvement in EEG. Therefore, it is likely that this is baseline  EEG for this patient.  Assessment   Patricia White is a 53 y.o. female who presented with altered mental status x2 days per staff, who state they are unsure of her baseline mentation. Chart review notes shows evidence of baseline cognitive impairment. On arrival to ED, she was somnolent with pinpoint pupils.    On exam today, she is alert, follows simple commands, moving around in bed with bilateral mitts on. Based on my conversation with SNF staff Friday, this seems to be her baseline.   Patient has had multiple  abnormal EEGs that are not likely to change with any additional treatment. Pharmacy consult was ordered, Dilantin  restarted today based on improved level. She is now on dysphagia 3 diet, restart PO meds as able.    Recommendations   - No need for EEG at this time.  - continue home AEDs, as able  Appreciate Pharmacy assistance with Dilantin  dosing and levels.  Unable to take PO perampanel  at this time, resume when safe to take medications by mouth Unable to take PO clonazepam , resume when safe to take medications by  mouth  Neurology will sign off at this time. Please recall with any further questions or concerns. Thank you for this consult.   ______________________________________________________________________   Signed, Rocky JAYSON Likes, NP [facility]   I have seen the patient and reviewed the above note.  She has had difficulty with Dilantin  dose in the past, with asked pharmacy for assistance.  Her neurological status improved markedly, no further neurological recommendations at this time.  She engages with me, reaches out to shake my hand.  Her speech is difficult to comprehend, but she does indicate that she has some belly pain and I relayed this to her attending.  Neurology will be available as needed, please call with further questions or concerns.  Aisha Seals, MD [facility]   If 7pm- 7am, please page neurology on call as listed in AMION.       [1]  Current Facility-Administered Medications:    acetaminophen  (TYLENOL ) tablet 650 mg, 650 mg, Oral, Q6H PRN **OR** acetaminophen  (TYLENOL ) suppository 650 mg, 650 mg, Rectal, Q6H PRN, Girguis, David, MD, 650 mg at 08/02/24 0102   Chlorhexidine  Gluconate Cloth 2 % PADS 6 each, 6 each, Topical, Daily, Girguis, David, MD, 6 each at 08/02/24 2257   enoxaparin  (LOVENOX ) injection 40 mg, 40 mg, Subcutaneous, Q24H, Girguis, David, MD, 40 mg at 08/02/24 1026   lacosamide  (VIMPAT ) 200 mg in sodium chloride  0.9 % 25 mL IVPB, 200 mg, Intravenous, Q12H, Patsy Lenis, MD, Last Rate: 90 mL/hr at 08/02/24 2254, 200 mg at 08/02/24 2254   lactated ringers  infusion, , Intravenous, Continuous, Patsy Lenis, MD, Last Rate: 75 mL/hr at 08/02/24 1524, New Bag at 08/02/24 1524   levETIRAcetam  (KEPPRA ) undiluted injection 1,000 mg, 1,000 mg, Intravenous, q morning, Patsy Lenis, MD, 1,000 mg at 08/02/24 1026   levETIRAcetam  (KEPPRA ) undiluted injection 1,500 mg, 1,500 mg, Intravenous, QHS, Patsy Lenis, MD, 1,500 mg at 08/02/24  2249   LORazepam  (ATIVAN ) injection 1 mg, 1 mg, Intravenous, TID, Patsy Lenis, MD, 1 mg at 08/02/24 2248   mupirocin  ointment (BACTROBAN ) 2 %, , Nasal, BID, Girguis, David, MD, 1 Application at 08/02/24 2249   nystatin  (MYCOSTATIN /NYSTOP ) topical powder 1 Application, 1 Application, Topical, BID, Patsy Lenis, MD, 1 Application at 08/02/24 2324   Oral care mouth rinse, 15 mL, Mouth Rinse, PRN, Patsy Lenis, MD   Oral care mouth rinse, 15 mL, Mouth Rinse, 4 times per day, Patsy Lenis, MD, 15 mL at 08/02/24 2258   perampanel  (FYCOMPA ) tablet 4 mg, 4 mg, Oral, Daily, Patsy Lenis, MD, 4 mg at 08/02/24 1634   phenytoin  (DILANTIN ) chewable tablet 75 mg, 75 mg, Oral, Daily, Casimir, Crystal S, RPH   phenytoin  (DILANTIN ) ER capsule 100 mg, 100 mg, Oral, QHS, Casimir Camelia RAMAN, RPH   sodium chloride  flush (NS) 0.9 % injection 3 mL, 3 mL, Intravenous, Q12H, Patsy Lenis, MD, 3 mL at 08/02/24 2258

## 2024-08-03 NOTE — Progress Notes (Signed)
 SLP Cancellation Note  Patient Details Name: Patricia White MRN: [id number] DOB: 1971-03-28   Cancelled treatment:       Reason Eval/Treat Not Completed: Patient at procedure or test/unavailable. Pt with other providers at time of attempt. Per MD note, pt much more alert without acute concerns related to swallowing. SLP will f/u as pt participation and schedule permits for evaluation.    Damien Blumenthal, M.A., CCC-SLP Speech Language Pathology, Acute Rehabilitation Services  Secure Chat preferred [phone]  08/03/2024, 3:27 PM

## 2024-08-03 NOTE — Progress Notes (Signed)
 Phenytoin  Initial Consult Indication: h/o seizures Returns to hospital with borderline to slightly high to high normal phenytoin  levels and AMS.  Allergies[1]  Patient Measurements: Height: 5' 5 (165.1 cm) Weight: 65.5 kg (144 lb 6.4 oz) IBW/kg (Calculated) : 57 TPN AdjBW (KG): 65.5 Body mass index is 24.03 kg/m.   Vital signs: Temp: 93 F (33.9 C) (12/21 0454) Temp Source: Axillary (12/21 0454) BP: 126/74 (12/21 0454) Pulse Rate: 79 (12/21 0454)  Labs: Lab Results  Component Value Date/Time   Albumin 3.3 (L) 08/03/2024 0441   Albumin 3.7 08/02/2024 0802   Phenytoin  Lvl 18.8 08/03/2024 0441   Lab Results  Component Value Date   PHENYTOIN  18.8 08/03/2024   VALPROATE <10 (L) 11/19/2023   CBMZ 2.3 (L) 11/19/2023   Estimated Creatinine Clearance: 74.9 mL/min (by C-G formula based on SCr of 0.47 mg/dL).    Assessment: 12/20: Measured phenytoin  level up to 21.2, Albumin 3.7 (fluctuates) Corrected phenytoin  level (if needed): 19 Seizure activity: None  Significant potential drug interactions: None Holding phenytoin  today  12/21: Measured phenytoin  level up to 18.8, Albumin 3.3 (fluctuates) Corrected phenytoin  level (if needed): 18.7 Seizure activity: None  Significant potential drug interactions: None Resume phenytoin  today.  Goals of care:  Total phenytoin  level: 10-20 mcg/ml Free phenytoin  level: 1-2 mcg/ml  Plan:  The recommended adjustment for high phenytoin  levels of 20-30 is to decrease the original dose by 25mg . Maintenance dose: 75mg  q AM, 100mg  q PM Pharmacy will continue to follow regarding obtaining total phenytoin  levels and dose adjustments as indicated. Recheck level in 5-7 days.   Laquanta Hummel Karoline Marina, PharmD, BCPS Clinical Staff Pharmacist  Marina, Camelia Stillinger 08/03/2024,7:07 AM     [1] No Known Allergies

## 2024-08-03 NOTE — TOC Progression Note (Addendum)
 Transition of Care [facility]) - Progression Note    Patient Details  Name: Patricia White MRN: [id number] Date of Birth: Nov 15, 1970  [facility] CM/SW Contact  Sonda Manuella Quill, RN Phone Number: 08/03/2024, 10:22 AM  Clinical Narrative:    Pt from [facility]; LVM for [place], Admissions and at facility to see if pt can admit today; awaiting return call  -1022- spoke w/ [place] Underline, Supervisor; she said pt can return today; she gave RM # [id number], call report # [phone]; transport by [facility].  -[id number] Bradley at facility notified pt will not return today; she will notify [place] Underline, supervisor; passing on to oncoming IP CM for follow up tomorrow.  Expected Discharge Plan: Long Term Nursing Home Barriers to Discharge: Continued Medical Work up               Expected Discharge Plan and Services In-house Referral: NA Discharge Planning Services: CM Consult Post Acute Care Choice: Nursing Home Living arrangements for the past 2 months: Skilled Nursing Facility                 DME Arranged: N/A DME Agency: NA       HH Arranged: NA HH Agency: NA         Social Drivers of Health (SDOH) Interventions SDOH Screenings   Food Insecurity: Patient Unable To Answer (08/01/2024)  Housing: Patient Unable To Answer (08/01/2024)  Transportation Needs: Patient Unable To Answer (08/01/2024)  Utilities: Patient Unable To Answer (05/03/2024)  Alcohol Screen: Low Risk (03/24/2023)  Depression (PHQ2-9): Low Risk (08/09/2023)  Financial Resource Strain: Low Risk (03/24/2023)  Physical Activity: Insufficiently Active (03/24/2023)  Social Connections: Moderately Isolated (11/19/2023)  Stress: No Stress Concern Present (03/24/2023)  Tobacco Use: Low Risk (07/31/2024)  Health Literacy: Adequate Health Literacy (03/24/2023)    Readmission Risk Interventions    11/16/2023   11:48 AM  Readmission Risk Prevention Plan  Transportation Screening Complete  PCP or Specialist Appt  within 5-7 Days Complete  Home Care Screening Complete  Medication Review (RN CM) Complete

## 2024-08-03 NOTE — Progress Notes (Signed)
 " Progress Note    Patricia White   FMW:[id number]  DOB: [birth year]  DOA: 07/31/2024     2 PCP: Alba Sharper, MD  Initial CC: Altered mentation  Hospital Course: Patricia White is a 53 y.o. female with medical history significant for cerebral palsy, intellectual disability, seizure disorder, and hyperlipidemia who was sent to the ED from her nursing facility for altered mental status.   Patient is reportedly not eating or drinking much and has had decreased level of consciousness for the past 2 days.  At her baseline, she is said to be mostly nonverbal but alert and interactive.   In the ER she was found to be hypothermic, 92.9 F.  Tachycardic in the 110s.  She underwent workup for infectious etiology and admitted for ongoing monitoring.  Of note, she was hospitalized in September 2025 for similar presentation with altered mentation and hypothermia.  She was evaluated by neurology at that time as well and underwent testing with EEG.  She is felt to have baseline abnormal EEG and treatment has been centered around treating her clinically instead per neurology.  She did have supratherapeutic Dilantin  level at that time and medication was held until level decreased.  Mentation slowly returned to baseline with no significant intervention during that hospitalization.  Assessment/Plan:   Acute metabolic encephalopathy - resolved Seizure disorder Cerebral palsy/IDD - Again presents with similar episode compared to September.  Decreased level of consciousness and not eating/drinking much.  Hypothermic in the ER, 92.9 F - Total dilantin  level was not as elevated as previously in September, 20.6 on admission - EEG initially ordered after admission but after discussion with neurology, now being canceled since abnormal in the past - pharmacy now dosing Dilantin  - change meds back to PO now that mentation better and taking PO again - diet resumed  Acute urinary retention - Developed  retention likely in setting of overall deconditioning and illness - Continue bladder scans for now and will straight cath as needed; still needing straight cath  SIRS - resolved  - hypothermic, WBC 12, tachycardia, tachypnea, normal lactic acid - Suspect volume depletion contributing and possibly breakthrough seizure - no obvious infection discovered on workup thus far: UA negative, CT head unremarkable, CXR negative, blood cultures drawn and negative so far, negative PCT as well - s/p IVF - continue monitoring off abx at this time   Prolonged QTc - Continue trending electrolytes and replete as needed  Interval History:  No events overnight.  Mentation even better today and she is now following all commands easily and completely awake and alert.  Able to reach her soda cup and drink easily. Also tolerating food much easier and swallowing pills again.  Still having some urinary retention requiring straight cath overnight.  Antimicrobials: N/a  DVT prophylaxis:  enoxaparin  (LOVENOX ) injection 40 mg Start: 08/01/24 1000   Code Status:   Code Status: Full Code  Mobility Assessment (Last 72 Hours)     Mobility Assessment     Row Name 08/03/24 0807 08/02/24 2014 08/02/24 1342 08/02/24 1003 08/01/24 0800   Does the patient have exclusion criteria? Yes- Hold (Level 0) - Assessment complete No- Perform mobility assessment Yes- Hold (Level 0) - Assessment complete Yes- Hold (Level 0) - Assessment complete Yes- Bedfast (Level 1) - Select exclusion criteria in next row   Mobility Assessment Exclusion Criteria No exclusion criteria present, perform mobility assessment -- No exclusion criteria present, perform mobility assessment -- No exclusion criteria present, perform mobility assessment  What is the highest level of mobility based on the mobility assessment? Level 1 (Bedfast) - Unable to balance while sitting on edge of bed Level 2 (Chairfast) - Balance while sitting on edge of bed and  cannot stand Level 1 (Bedfast) - Unable to balance while sitting on edge of bed -- Level 1 (Bedfast) - Unable to balance while sitting on edge of bed   Is the above level different from baseline mobility prior to current illness? No - Consider discontinuing PT/OT -- -- -- No - Consider discontinuing PT/OT    Row Name 08/01/24 0200           Does the patient have exclusion criteria? Yes- Bedfast (Level 1) - Select exclusion criteria in next row       Mobility Assessment Exclusion Criteria No exclusion criteria present, perform mobility assessment       What is the highest level of mobility based on the mobility assessment? Level 1 (Bedfast) - Unable to balance while sitting on edge of bed       Is the above level different from baseline mobility prior to current illness? Yes - Recommend PT order          Diet: Diet Orders (From admission, onward)     Start     Ordered   08/02/24 1432  DIET DYS 3 Room service appropriate? Yes with Assist; Fluid consistency: Thin  Diet effective now       Comments: Meds as tolerated, full supervision with hx of cog deficits  Question Answer Comment  Room service appropriate? Yes with Assist   Fluid consistency: Thin      08/02/24 1431            Barriers to discharge: none Disposition Plan: [facility] orders placed: n/a Status is: Inpatient  Objective: Blood pressure (!) 153/100, pulse 92, temperature 97.7 F (36.5 C), temperature source Oral, resp. rate 18, height 5' 5 (1.651 m), weight 65.5 kg, SpO2 99%.  Examination:  Physical Exam Constitutional:      Comments: Resting comfortably in bed and now following commands and moving all 4 extremities to command; awake and alert, now back to normal mentation state (still nonverbal which is baseline)  HENT:     Head: Normocephalic and atraumatic.     Mouth/Throat:     Comments: Extremely unkempt oral mucosa and teeth Eyes:     Extraocular Movements: Extraocular movements intact.   Cardiovascular:     Rate and Rhythm: Normal rate and regular rhythm.  Pulmonary:     Effort: Pulmonary effort is normal. No respiratory distress.     Breath sounds: Normal breath sounds. No wheezing.  Abdominal:     General: Bowel sounds are normal. There is no distension.     Palpations: Abdomen is soft.     Tenderness: There is no abdominal tenderness.  Musculoskeletal:        General: Normal range of motion.     Cervical back: Normal range of motion and neck supple.  Skin:    General: Skin is warm and dry.  Neurological:     Comments: Essentially nonverbal which is baseline.  Following commands and moving all 4 extremities      Consultants:  Neurology  Procedures:    Data Reviewed: Results for orders placed or performed during the hospital encounter of 07/31/24 (from the past 24 hours)  Phenytoin  level, total     Status: None   Collection Time: 08/03/24  4:41 AM  Result  Value Ref Range   Phenytoin  Lvl 18.8 10.0 - 20.0 ug/mL  Comprehensive metabolic panel with GFR     Status: Abnormal   Collection Time: 08/03/24  4:41 AM  Result Value Ref Range   Sodium 146 (H) 135 - 145 mmol/L   Potassium 3.3 (L) 3.5 - 5.1 mmol/L   Chloride 108 98 - 111 mmol/L   CO2 30 22 - 32 mmol/L   Glucose, Bld 86 70 - 99 mg/dL   BUN 8 6 - 20 mg/dL   Creatinine, Ser 9.52 0.44 - 1.00 mg/dL   Calcium  9.3 8.9 - 10.3 mg/dL   Total Protein 7.4 6.5 - 8.1 g/dL   Albumin 3.3 (L) 3.5 - 5.0 g/dL   AST 29 15 - 41 U/L   ALT 23 0 - 44 U/L   Alkaline Phosphatase 174 (H) 38 - 126 U/L   Total Bilirubin 0.3 0.0 - 1.2 mg/dL   GFR, Estimated >39 >39 mL/min   Anion gap 8 5 - 15    I have reviewed pertinent nursing notes, vitals, labs, and images as necessary. I have ordered labwork to follow up on as indicated.  I have reviewed the last notes from staff over past 24 hours. I have discussed patient's care plan and test results with nursing staff, CM/SW, and other staff as appropriate.  Old records reviewed  in assessment of this patient  Time spent: Greater than 50% of the 55 minute visit was spent in counseling/coordination of care for the patient as laid out in the A&P.   LOS: 2 days   Alm Apo, MD [facility] 08/03/2024, 12:46 PM "

## 2024-08-04 LAB — GLUCOSE, CAPILLARY: Glucose-Capillary: 108 mg/dL — ABNORMAL HIGH (ref 70–99)

## 2024-08-04 MED ORDER — PERAMPANEL 4 MG PO TABS: 4.0000 mg | ORAL_TABLET | Freq: Every day | ORAL | 0 refills | Status: DC

## 2024-08-04 MED ORDER — CLONAZEPAM 0.5 MG PO TABS: 0.5000 mg | ORAL_TABLET | Freq: Three times a day (TID) | ORAL | 0 refills | Status: DC

## 2024-08-04 MED ORDER — PHENYTOIN 50 MG PO CHEW: 75.0000 mg | CHEWABLE_TABLET | Freq: Every day | ORAL | Status: AC

## 2024-08-04 MED ORDER — PHENYTOIN SODIUM EXTENDED 100 MG PO CAPS: 100.0000 mg | ORAL_CAPSULE | Freq: Every day | ORAL | Status: AC

## 2024-08-04 NOTE — Progress Notes (Signed)
 SLP Cancellation Note  Patient Details Name: Patricia White MRN: [id number] DOB: 1970/08/19   Cancelled treatment:       Reason Eval/Treat Not Completed: Other (comment) SLP secure message chatted MD who indicated patient has improved significantly, back to baseline and no longer requires SLP swallow evaluation.   [name], MA, CCC-SLP Speech Therapy  08/04/2024, 11:20 AM

## 2024-08-04 NOTE — TOC Transition Note (Signed)
 Transition of Care [facility]) - Discharge Note   Patient Details  Name: Patricia White MRN: [id number] Date of Birth: 01-24-1971  [facility] CM/SW Contact:  Patricia Glenys DASEN, RN Phone Number: 08/04/2024, 12:21 PM   Clinical Narrative:     Per MD patient ready for DC to [facility]. RN to call report prior to discharge [phone] Rm [phone]). RN, patient, Legal Guardian Patricia White at [phone] and facility notified of DC. Discharge Summary and FL2 sent to facility. DC packet on chart. Ambulance PT[facility] transport requested for patient.   CM will sign off.Please consult us  again if new needs arise.    Final next level of care: Skilled Nursing Facility Barriers to Discharge: Barriers Resolved   Patient Goals and CMS Choice Patient states their goals for this hospitalization and ongoing recovery are:: [facility].gov Compare Post Acute Care list provided to:: Legal Guardian (Legal Guardian Patricia White at [phone]) Choice offered to / list presented to : [facility] POA / Guardian [place] ownership interest in [facility].provided to:: [facility] POA / Guardian    Discharge Placement   Existing PASRR number confirmed : 08/04/24          Patient chooses bed at:  [facility]) Patient to be transferred to facility by: PT[facility] Name of family member notified: Legal Guardian Patricia White at [phone] Patient and family notified of of transfer: 08/04/24  Discharge Plan and Services Additional resources added to the After Visit Summary for   In-house Referral: NA Discharge Planning Services: CM Consult Post Acute Care Choice: Nursing Home          DME Arranged: N/A DME Agency: NA       HH Arranged: NA HH Agency: NA        Social Drivers of Health (SDOH) Interventions SDOH Screenings   Food Insecurity: Patient Unable To Answer (08/01/2024)  Housing: Patient Unable To Answer (08/01/2024)  Transportation Needs: Patient Unable To Answer  (08/01/2024)  Utilities: Patient Unable To Answer (05/03/2024)  Alcohol Screen: Low Risk (03/24/2023)  Depression (PHQ2-9): Low Risk (08/09/2023)  Financial Resource Strain: Low Risk (03/24/2023)  Physical Activity: Insufficiently Active (03/24/2023)  Social Connections: Moderately Isolated (11/19/2023)  Stress: No Stress Concern Present (03/24/2023)  Tobacco Use: Low Risk (07/31/2024)  Health Literacy: Adequate Health Literacy (03/24/2023)     Readmission Risk Interventions    11/16/2023   11:48 AM  Readmission Risk Prevention Plan  Transportation Screening Complete  PCP or Specialist Appt within 5-7 Days Complete  Home Care Screening Complete  Medication Review (RN CM) Complete

## 2024-08-04 NOTE — Progress Notes (Signed)
 Report called to and taken by receiving nurse at [facility].

## 2024-08-04 NOTE — Discharge Summary (Signed)
 " Physician Discharge Summary   Patricia White FMW:[id number] DOB: 1971/01/26 DOA: 07/31/2024  PCP: Alba Sharper, MD  Admit date: 07/31/2024 Discharge date: 08/04/2024  Admitted From: [facility]  Disposition:  [facility] Discharging physician: Alm Apo, MD Barriers to discharge: none  Recommendations at discharge: Repeat dilantin  level either on 12/26 or 12/29. See new dosing below    Discharge Condition: stable CODE STATUS: Full  Diet recommendation:  Diet Orders (From admission, onward)     Start     Ordered   08/02/24 1432  DIET DYS 3 Room service appropriate? Yes with Assist; Fluid consistency: Thin  Diet effective now       Comments: Meds as tolerated, full supervision with hx of cog deficits  Question Answer Comment  Room service appropriate? Yes with Assist   Fluid consistency: Thin      08/02/24 1431            Hospital Course: Patricia White is a 53 y.o. female with medical history significant for cerebral palsy, intellectual disability, seizure disorder, and hyperlipidemia who was sent to the ED from her nursing facility for altered mental status.   Patient is reportedly not eating or drinking much and has had decreased level of consciousness for the past 2 days.  At her baseline, she is said to be mostly nonverbal but alert and interactive.   In the ER she was found to be hypothermic, 92.9 F.  Tachycardic in the 110s.  She underwent workup for infectious etiology and admitted for ongoing monitoring.  Of note, she was hospitalized in September 2025 for similar presentation with altered mentation and hypothermia.  She was evaluated by neurology at that time as well and underwent testing with EEG.  She is felt to have baseline abnormal EEG and treatment has been centered around treating her clinically instead per neurology.  She did have supratherapeutic Dilantin  level at that time and medication was held until level decreased.  Mentation  slowly returned to baseline with no significant intervention during that hospitalization.  Assessment/Plan:   Acute metabolic encephalopathy - resolved Seizure disorder Cerebral palsy/IDD - Again presents with similar episode compared to September.  Decreased level of consciousness and not eating/drinking much.  Hypothermic in the ER, 92.9 F - Total dilantin  level was not as elevated as previously in September, 20.6 on admission - EEG initially ordered after admission but after discussion with neurology, now being canceled since abnormal in the past - pharmacy now dosing Dilantin  - continue new dosing of dilantin . 75 mg in morning and 100 mg at bedtime. Repeat dilantin  level on 12/26 or 12/29 depending on lab availability at SNF  Acute urinary retention - resolved  - Developed retention likely in setting of overall deconditioning and illness - required bladder scanning and straight caths, but resolved and able to void adequately prior to discharge   SIRS - resolved  - hypothermic, WBC 12, tachycardia, tachypnea, normal lactic acid - Suspect volume depletion contributing and possibly breakthrough seizure - no obvious infection discovered on workup thus far: UA negative, CT head unremarkable, CXR negative, blood cultures drawn and negative so far, negative PCT as well - s/p IVF - continue monitoring off abx at this time   Prolonged QTc - electrolytes repleted as needed   The patient's acute and chronic medical conditions were treated accordingly. On day of discharge, patient was felt deemed stable for discharge. Patient/family member advised to call PCP or come back to ER if needed.   Principal  Diagnosis: Acute metabolic encephalopathy  Discharge Diagnoses: Active Hospital Problems   Diagnosis Date Noted   Acute metabolic encephalopathy 07/31/2024    Priority: 1.   SIRS (systemic inflammatory response syndrome) (HCC) 02/05/2024    Priority: 2.   Acute urinary retention  08/02/2024    Priority: 3.   Seizure disorder (HCC) 02/23/2016    Priority: 3.   Acute encephalopathy 08/01/2024   Prolonged QT interval 07/31/2024   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   Infantile cerebral palsy (HCC) 10/11/2006    Resolved Hospital Problems   Diagnosis Date Noted Date Resolved   Hypothermia 05/05/2024 08/01/2024     Discharge Instructions     Increase activity slowly   Complete by: As directed    No wound care   Complete by: As directed       Allergies as of 08/04/2024   No Known Allergies      Medication List     TAKE these medications    acetaminophen  325 MG tablet Commonly known as: TYLENOL  Take 2 tablets (650 mg total) by mouth every 6 (six) hours as needed for mild pain (pain score 1-3) or fever.   clonazePAM  0.5 MG tablet Commonly known as: KLONOPIN  Take 1 tablet (0.5 mg total) by mouth 3 (three) times daily.   Keppra  500 MG tablet Generic drug: levETIRAcetam  Take 1,000-1,500 mg by mouth See admin instructions. Take 1,000 mg by mouth in the morning and 1,500 mg at bedtime What changed: Another medication with the same name was removed. Continue taking this medication, and follow the directions you see here.   lacosamide  200 MG Tabs tablet Commonly known as: VIMPAT  Take 1 tablet (200 mg total) by mouth 2 (two) times daily.   melatonin 3 MG Tabs tablet Take 3 mg by mouth at bedtime.   mirtazapine 7.5 MG tablet Commonly known as: REMERON Take 22.5 mg by mouth at bedtime.   nystatin  powder Apply 1 Application topically See admin instructions. Apply under both breasts 2 times a day until healed   ondansetron  4 MG tablet Commonly known as: ZOFRAN  Take 4 mg by mouth every 6 (six) hours as needed for nausea or vomiting (may be crushed and mixed into 5 ml's of water if needed- to digest).   pantoprazole  40 MG tablet Commonly known as: PROTONIX  Take 1 tablet (40 mg total) by mouth  daily. What changed: when to take this   Perampanel  4 MG Tabs Take 1 tablet (4 mg total) by mouth daily.   phenytoin  100 MG ER capsule Commonly known as: DILANTIN  Take 1 capsule (100 mg total) by mouth at bedtime. What changed: when to take this   phenytoin  50 MG tablet Commonly known as: DILANTIN  Chew 1.5 tablets (75 mg total) by mouth daily.   polyethylene glycol 17 g packet Commonly known as: MIRALAX  / GLYCOLAX  Take 17 g by mouth daily.   QUEtiapine 25 MG tablet Commonly known as: SEROQUEL Take 12.5 mg by mouth at bedtime.   rosuvastatin  20 MG tablet Commonly known as: CRESTOR  Take 1 tablet (20 mg total) by mouth daily. What changed: when to take this   senna-docusate 8.6-50 MG tablet Commonly known as: Senokot-S Take 1 tablet by mouth daily.        Allergies[1]  Consultations: Neurology   Procedures:   Discharge Exam: BP (!) 153/111 (BP Location: Left Arm)   Pulse 80   Temp 97.7 F (36.5 C) (Oral)   Resp 18   Ht  5' 5 (1.651 m)   Wt 65.5 kg   SpO2 100%   BMI 24.03 kg/m  Physical Exam Constitutional:      Comments: Resting comfortably in bed and now following commands and moving all 4 extremities to command; awake and alert, now back to normal mentation state (still nonverbal which is baseline)  HENT:     Head: Normocephalic and atraumatic.     Mouth/Throat:     Comments: Extremely unkempt oral mucosa and teeth Eyes:     Extraocular Movements: Extraocular movements intact.  Cardiovascular:     Rate and Rhythm: Normal rate and regular rhythm.  Pulmonary:     Effort: Pulmonary effort is normal. No respiratory distress.     Breath sounds: Normal breath sounds. No wheezing.  Abdominal:     General: Bowel sounds are normal. There is no distension.     Palpations: Abdomen is soft.     Tenderness: There is no abdominal tenderness.  Musculoskeletal:        General: Normal range of motion.     Cervical back: Normal range of motion and neck supple.   Skin:    General: Skin is warm and dry.  Neurological:     Comments: Essentially nonverbal which is baseline.  Following commands and moving all 4 extremities      The results of significant diagnostics from this hospitalization (including imaging, microbiology, ancillary and laboratory) are listed below for reference.   Microbiology: Recent Results (from the past 240 hours)  Resp panel by RT-PCR (RSV, Flu A&B, Covid) Anterior Nasal Swab     Status: None   Collection Time: 07/31/24  4:21 PM   Specimen: Anterior Nasal Swab  Result Value Ref Range Status   SARS Coronavirus 2 by RT PCR NEGATIVE NEGATIVE Final    Comment: (NOTE) SARS-CoV-2 target nucleic acids are NOT DETECTED.  The SARS-CoV-2 RNA is generally detectable in upper respiratory specimens during the acute phase of infection. The lowest concentration of SARS-CoV-2 viral copies this assay can detect is 138 copies/mL. A negative result does not preclude SARS-Cov-2 infection and should not be used as the sole basis for treatment or other patient management decisions. A negative result may occur with  improper specimen collection/handling, submission of specimen other than nasopharyngeal swab, presence of viral mutation(s) within the areas targeted by this assay, and inadequate number of viral copies(<138 copies/mL). A negative result must be combined with clinical observations, patient history, and epidemiological information. The expected result is Negative.  Fact Sheet for Patients:  bloggercourse.com  Fact Sheet for Healthcare Providers:  seriousbroker.it  This test is no t yet approved or cleared by the [place]  FDA and  has been authorized for detection and/or diagnosis of SARS-CoV-2 by FDA under an Emergency Use Authorization (EUA). This EUA will remain  in effect (meaning this test can be used) for the duration of the COVID-19 declaration under Section  564(b)(1) of the Act, 21 U.S.C.section 360bbb-3(b)(1), unless the authorization is terminated  or revoked sooner.       Influenza A by PCR NEGATIVE NEGATIVE Final   Influenza B by PCR NEGATIVE NEGATIVE Final    Comment: (NOTE) The Xpert Xpress SARS-CoV-2/FLU/RSV plus assay is intended as an aid in the diagnosis of influenza from Nasopharyngeal swab specimens and should not be used as a sole basis for treatment. Nasal washings and aspirates are unacceptable for Xpert Xpress SARS-CoV-2/FLU/RSV testing.  Fact Sheet for Patients: bloggercourse.com  Fact Sheet for Healthcare Providers: seriousbroker.it  This  test is not yet approved or cleared by the [place]  FDA and has been authorized for detection and/or diagnosis of SARS-CoV-2 by FDA under an Emergency Use Authorization (EUA). This EUA will remain in effect (meaning this test can be used) for the duration of the COVID-19 declaration under Section 564(b)(1) of the Act, 21 U.S.C. section 360bbb-3(b)(1), unless the authorization is terminated or revoked.     Resp Syncytial Virus by PCR NEGATIVE NEGATIVE Final    Comment: (NOTE) Fact Sheet for Patients: bloggercourse.com  Fact Sheet for Healthcare Providers: seriousbroker.it  This test is not yet approved or cleared by the [place]  FDA and has been authorized for detection and/or diagnosis of SARS-CoV-2 by FDA under an Emergency Use Authorization (EUA). This EUA will remain in effect (meaning this test can be used) for the duration of the COVID-19 declaration under Section 564(b)(1) of the Act, 21 U.S.C. section 360bbb-3(b)(1), unless the authorization is terminated or revoked.  Performed at [facility], [address], [place], [place] [address]   Blood culture (routine x 2)     Status: None (Preliminary result)   Collection Time: 07/31/24  8:57  PM   Specimen: BLOOD RIGHT ARM  Result Value Ref Range Status   Specimen Description   Final    BLOOD RIGHT ARM Performed at [facility], [address], [place], [place] [address]    Special Requests   Final    BOTTLES DRAWN AEROBIC AND ANAEROBIC Blood Culture results may not be optimal due to an inadequate volume of blood received in culture bottles Performed at [facility], [address], [place], [place] [address]    Culture   Final    NO GROWTH 3 DAYS Performed at [facility], [address], [place], [place] [address]    Report Status PENDING  Incomplete  MRSA Next Gen by PCR, Nasal     Status: Abnormal   Collection Time: 08/01/24  1:38 AM   Specimen: Nasal Mucosa; Nasal Swab  Result Value Ref Range Status   MRSA by PCR Next Gen DETECTED (A) NOT DETECTED Final    Comment: (NOTE) The GeneXpert MRSA Assay (FDA approved for NASAL specimens only), is one component of a comprehensive MRSA colonization surveillance program. It is not intended to diagnose MRSA infection nor to guide or monitor treatment for MRSA infections. Test performance is not FDA approved in patients less than 21 years old. Performed at [facility], [address] [place], [place] [address]   Blood culture (routine x 2)     Status: None (Preliminary result)   Collection Time: 08/01/24  3:33 AM   Specimen: BLOOD  Result Value Ref Range Status   Specimen Description   Final    BLOOD BLOOD LEFT ARM Performed at [facility], [address], [place], [place] [address]    Special Requests   Final    BOTTLES DRAWN AEROBIC ONLY Blood Culture results may not be optimal due to an inadequate volume of blood received in culture bottles Performed at [facility], [address], [place], [place] [address]    Culture   Final    NO GROWTH 2 DAYS Performed at [facility], [address], [place],  [place] [address]    Report Status PENDING  Incomplete     Labs: BNP (last 3 results) Recent Labs    12/30/23 0001  BNP 16.0   Basic Metabolic  Panel: Recent Labs  Lab 07/31/24 1511 08/01/24 1022 08/02/24 0317 08/03/24 0441  NA 145 147* 146* 146*  K 4.5 3.6 3.3* 3.3*  CL 105 109 107 108  CO2 24 23 23 30   GLUCOSE 105* 78 80 86  BUN 15 11 9 8   CREATININE 0.66 0.71 0.64 0.47  CALCIUM  10.2 9.1 9.5 9.3  MG  --  1.8 1.7  --    Liver Function Tests: Recent Labs  Lab 07/31/24 1511 08/01/24 1022 08/02/24 0802 08/03/24 0441  AST 39 28 32 29  ALT 41 25 25 23   ALKPHOS 222* 188* 187* 174*  BILITOT 0.4 0.4 0.3 0.3  PROT 9.4* 7.5 8.2* 7.4  ALBUMIN 4.1 3.3* 3.7 3.3*   No results for input(s): LIPASE, AMYLASE in the last 168 hours. Recent Labs  Lab 08/01/24 0229  AMMONIA 32   CBC: Recent Labs  Lab 07/31/24 1511 08/01/24 0333 08/02/24 0317  WBC 12.0* 8.5 6.5  NEUTROABS 10.7*  --  4.1  HGB 11.6* 10.6* 8.9*  HCT 37.7 33.8* 29.1*  MCV 85.7 83.7 84.3  PLT 184 112* 121*   Cardiac Enzymes: No results for input(s): CKTOTAL, CKMB, CKMBINDEX, TROPONINI in the last 168 hours. BNP: Invalid input(s): POCBNP CBG: Recent Labs  Lab 07/31/24 1444 08/04/24 0021  GLUCAP 99 108*   D-Dimer No results for input(s): DDIMER in the last 72 hours. Hgb A1c No results for input(s): HGBA1C in the last 72 hours. Lipid Profile No results for input(s): CHOL, HDL, LDLCALC, TRIG, CHOLHDL, LDLDIRECT in the last 72 hours. Thyroid  function studies No results for input(s): TSH, T4TOTAL, T3FREE, THYROIDAB in the last 72 hours.  Invalid input(s): FREET3 Anemia work up No results for input(s): VITAMINB12, FOLATE, FERRITIN, TIBC, IRON, RETICCTPCT in the last 72 hours. Urinalysis    Component Value Date/Time   COLORURINE YELLOW 07/31/2024 1620   APPEARANCEUR CLEAR 07/31/2024 1620   LABSPEC 1.024 07/31/2024 1620   PHURINE 5.0 07/31/2024 1620    GLUCOSEU NEGATIVE 07/31/2024 1620   HGBUR NEGATIVE 07/31/2024 1620   BILIRUBINUR NEGATIVE 07/31/2024 1620   BILIRUBINUR negative 11/25/2020 1220   KETONESUR 80 (A) 07/31/2024 1620   PROTEINUR 30 (A) 07/31/2024 1620   UROBILINOGEN 0.2 11/25/2020 1220   NITRITE NEGATIVE 07/31/2024 1620   LEUKOCYTESUR NEGATIVE 07/31/2024 1620   Sepsis Labs Recent Labs  Lab 07/31/24 1511 08/01/24 0333 08/02/24 0317  WBC 12.0* 8.5 6.5   Microbiology Recent Results (from the past 240 hours)  Resp panel by RT-PCR (RSV, Flu A&B, Covid) Anterior Nasal Swab     Status: None   Collection Time: 07/31/24  4:21 PM   Specimen: Anterior Nasal Swab  Result Value Ref Range Status   SARS Coronavirus 2 by RT PCR NEGATIVE NEGATIVE Final    Comment: (NOTE) SARS-CoV-2 target nucleic acids are NOT DETECTED.  The SARS-CoV-2 RNA is generally detectable in upper respiratory specimens during the acute phase of infection. The lowest concentration of SARS-CoV-2 viral copies this assay can detect is 138 copies/mL. A negative result does not preclude SARS-Cov-2 infection and should not be used as the sole basis for treatment or other patient management decisions. A negative result may occur with  improper specimen collection/handling, submission of specimen other than nasopharyngeal swab, presence of viral mutation(s) within the areas targeted by this assay, and inadequate number of viral copies(<138 copies/mL). A negative result must be combined with clinical observations, patient history, and epidemiological information. The expected result is Negative.  Fact Sheet for Patients:  bloggercourse.com  Fact Sheet for Healthcare Providers:  seriousbroker.it  This test is no t yet approved or cleared by the [place]  FDA and  has been authorized for detection and/or diagnosis of SARS-CoV-2 by FDA under an Emergency Use Authorization (EUA). This EUA will remain  in  effect (meaning this test can be used) for the duration of the COVID-19 declaration under Section 564(b)(1) of the Act, 21 U.S.C.section 360bbb-3(b)(1), unless the authorization is terminated  or revoked sooner.       Influenza A by PCR NEGATIVE NEGATIVE Final   Influenza B by PCR NEGATIVE NEGATIVE Final    Comment: (NOTE) The Xpert Xpress SARS-CoV-2/FLU/RSV plus assay is intended as an aid in the diagnosis of influenza from Nasopharyngeal swab specimens and should not be used as a sole basis for treatment. Nasal washings and aspirates are unacceptable for Xpert Xpress SARS-CoV-2/FLU/RSV testing.  Fact Sheet for Patients: bloggercourse.com  Fact Sheet for Healthcare Providers: seriousbroker.it  This test is not yet approved or cleared by the [place]  FDA and has been authorized for detection and/or diagnosis of SARS-CoV-2 by FDA under an Emergency Use Authorization (EUA). This EUA will remain in effect (meaning this test can be used) for the duration of the COVID-19 declaration under Section 564(b)(1) of the Act, 21 U.S.C. section 360bbb-3(b)(1), unless the authorization is terminated or revoked.     Resp Syncytial Virus by PCR NEGATIVE NEGATIVE Final    Comment: (NOTE) Fact Sheet for Patients: bloggercourse.com  Fact Sheet for Healthcare Providers: seriousbroker.it  This test is not yet approved or cleared by the [place]  FDA and has been authorized for detection and/or diagnosis of SARS-CoV-2 by FDA under an Emergency Use Authorization (EUA). This EUA will remain in effect (meaning this test can be used) for the duration of the COVID-19 declaration under Section 564(b)(1) of the Act, 21 U.S.C. section 360bbb-3(b)(1), unless the authorization is terminated or revoked.  Performed at [facility], [address], [place], [place] [address]    Blood culture (routine x 2)     Status: None (Preliminary result)   Collection Time: 07/31/24  8:57 PM   Specimen: BLOOD RIGHT ARM  Result Value Ref Range Status   Specimen Description   Final    BLOOD RIGHT ARM Performed at [facility], [address], [place], [place] [address]    Special Requests   Final    BOTTLES DRAWN AEROBIC AND ANAEROBIC Blood Culture results may not be optimal due to an inadequate volume of blood received in culture bottles Performed at [facility], [address] [place], [place] [address]    Culture   Final    NO GROWTH 3 DAYS Performed at [facility], [address] [place], [place] [address]    Report Status PENDING  Incomplete  MRSA Next Gen by PCR, Nasal     Status: Abnormal   Collection Time: 08/01/24  1:38 AM   Specimen: Nasal Mucosa; Nasal Swab  Result Value Ref Range Status   MRSA by PCR Next Gen DETECTED (A) NOT DETECTED Final    Comment: (NOTE) The GeneXpert MRSA Assay (FDA approved for NASAL specimens only), is one component of a comprehensive MRSA colonization surveillance program. It is not intended to diagnose MRSA infection nor to guide or monitor treatment for MRSA infections. Test performance is not FDA approved in patients less than 32 years old. Performed at [facility], [address], [place], [place] [address]  Blood culture (routine x 2)     Status: None (Preliminary result)   Collection Time: 08/01/24  3:33 AM   Specimen: BLOOD  Result Value Ref Range Status   Specimen Description   Final    BLOOD BLOOD LEFT ARM Performed at [facility], [address], [place], [place] [address]    Special Requests   Final    BOTTLES DRAWN AEROBIC ONLY Blood Culture results may not be optimal due to an inadequate volume of blood received in culture bottles Performed at [facility], [address], [place], [place] [address]     Culture   Final    NO GROWTH 2 DAYS Performed at [facility], [address] [place], [place] [address]    Report Status PENDING  Incomplete    Procedures/Studies: CT Head Wo Contrast Result Date: 07/31/2024 EXAM: CT HEAD WITHOUT CONTRAST 07/31/2024 04:07:58 PM TECHNIQUE: CT of the head was performed without the administration of intravenous contrast. Automated exposure control, iterative reconstruction, and/or weight based adjustment of the mA/kV was utilized to reduce the radiation dose to as low as reasonably achievable. COMPARISON: None available. CLINICAL HISTORY: Mental status change, unknown cause Mental status change, unknown cause FINDINGS: BRAIN AND VENTRICLES: No acute hemorrhage. No evidence of acute infarct. No hydrocephalus. No extra-axial collection. No mass effect or midline shift. ORBITS: No acute abnormality. SINUSES: No acute abnormality. SOFT TISSUES AND SKULL: No acute soft tissue abnormality. No skull fracture. IMPRESSION: 1. No acute intracranial abnormality. Electronically signed by: Dorethia Molt MD 07/31/2024 05:54 PM EST RP Workstation: [id number]   DG Chest Portable 1 View Result Date: 07/31/2024 EXAM: 1 VIEW(S) XRAY OF THE CHEST 07/31/2024 04:28:00 PM COMPARISON: 06/29/2024 CLINICAL HISTORY: cough, AMS FINDINGS: LUNGS AND PLEURA: Low lung volume. Elevated left hemidiaphragm. No focal pulmonary opacity. No pleural effusion. No pneumothorax. HEART AND MEDIASTINUM: No acute abnormality of the cardiac and mediastinal silhouettes. BONES AND SOFT TISSUES: No acute osseous abnormality. IMPRESSION: 1. No acute findings. Electronically signed by: Greig Pique MD 07/31/2024 04:50 PM EST RP Workstation: [id number]     Time coordinating discharge: Over 30 minutes    Alm Apo, MD  [facility] 08/04/2024, 9:53 AM    [1] No Known Allergies  "

## 2024-08-04 NOTE — NC FL2 (Signed)
 " [place]  MEDICAID FL2 LEVEL OF CARE FORM     IDENTIFICATION  Patient Name: Patricia White Birthdate: 1971/04/02 Sex: female Admission Date (Current Location): 07/31/2024  [facility] and [place] Number:  Producer, Television/film/video and Address:  [facility],  [address] [place], [place] [address]      Provider Number: [id number]  Attending Physician Name and Address:  Patsy Lenis, MD  Relative Name and Phone Number:       Current Level of Care: Hospital Recommended Level of Care: Skilled Nursing Facility Prior Approval Number:    Date Approved/Denied:   PASRR Number: [id number] F Expired 10/14/2024  Discharge Plan: SNF    Current Diagnoses: Patient Active Problem List   Diagnosis Date Noted   Acute urinary retention 08/02/2024   Acute encephalopathy 08/01/2024   Acute metabolic encephalopathy 07/31/2024   Prolonged QT interval 07/31/2024   Elevated phenytoin  level 05/05/2024   AMS (altered mental status) 05/02/2024   Sepsis (HCC) 02/06/2024   SIRS (systemic inflammatory response syndrome) (HCC) 02/05/2024   Blood per rectum 01/02/2024   Ulcer of upper extremity 12/31/2023   Facial swelling 12/30/2023   Open wound of left upper arm 12/29/2023   Sinus tachycardia 12/29/2023   Pressure injury of skin of left elbow 12/29/2023   Intellectual disability 12/28/2023   Cerebral palsy (HCC) 12/28/2023   Infestation by bed bug 12/27/2023   Seizure-like activity (HCC) 12/27/2023   Left arm pain 11/24/2023   Hypotension 11/23/2023   Left arm swelling 11/23/2023   Status epilepticus (HCC) 11/19/2023   Pancytopenia (HCC) 11/11/2023   Hypomagnesemia 11/09/2023   Dehydration 11/08/2023   Gastritis and gastroduodenitis 11/08/2023   Gastric polyp 11/08/2023   Nausea without vomiting 11/08/2023   Protein-calorie malnutrition, severe 11/08/2023   Decreased appetite 11/05/2023   Lower urinary tract infectious disease 11/01/2023   Chronic health problem 11/01/2023   Oral  thrush 11/01/2023   FTT (failure to thrive) in adult 11/01/2023   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   Hot flashes due to menopause 06/30/2021   Hyperlipidemia associated with type 2 diabetes mellitus (HCC) 11/24/2020   Diabetes mellitus without complication (HCC) 11/23/2020   Angioedema 12/10/2019   Lesion of mandible 12/10/2019   Seasonal allergies 11/20/2019   Essential hypertension 10/23/2019   Seizure disorder (HCC) 02/23/2016   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   Obesity (BMI 30.0-34.9) 10/13/2009   Infantile cerebral palsy (HCC) 10/11/2006    Orientation RESPIRATION BLADDER Height & Weight     Self, Time, Situation, Place  Normal External catheter, Incontinent Weight: 65.5 kg Height:  5' 5 (165.1 cm)  BEHAVIORAL SYMPTOMS/MOOD NEUROLOGICAL BOWEL NUTRITION STATUS      Continent Diet  AMBULATORY STATUS COMMUNICATION OF NEEDS Skin   Limited Assist Verbally PU Stage and Appropriate Care   PU Stage 2 Dressing: Daily                   Personal Care Assistance Level of Assistance  Bathing, Feeding, Dressing Bathing Assistance: Limited assistance Feeding assistance: Limited assistance Dressing Assistance: Limited assistance     Functional Limitations Info  Speech     Speech Info: Impaired    SPECIAL CARE FACTORS FREQUENCY                       Contractures Contractures Info: Not present    Additional Factors Info  Code Status, Allergies, Psychotropic Code Status Info: FULL Allergies Info: NKA Psychotropic Info: Klonopin , Keppra ,  Dilantin , Vimpat , Fycompa          Current Medications (08/04/2024):  This is the current hospital active medication list Current Facility-Administered Medications  Medication Dose Route Frequency Provider Last Rate Last Admin   acetaminophen  (TYLENOL ) tablet 650 mg  650 mg Oral Q6H PRN Patsy Lenis, MD   650 mg at 08/03/24 2224   Or   acetaminophen  (TYLENOL )  suppository 650 mg  650 mg Rectal Q6H PRN Patsy Lenis, MD   650 mg at 08/02/24 0102   Chlorhexidine  Gluconate Cloth 2 % PADS 6 each  6 each Topical Daily Patsy Lenis, MD   6 each at 08/03/24 1039   clonazePAM  (KLONOPIN ) tablet 0.5 mg  0.5 mg Oral TID Patsy Lenis, MD   0.5 mg at 08/03/24 2100   enoxaparin  (LOVENOX ) injection 40 mg  40 mg Subcutaneous Q24H Patsy Lenis, MD   40 mg at 08/03/24 1038   lacosamide  (VIMPAT ) tablet 200 mg  200 mg Oral BID Patsy Lenis, MD   200 mg at 08/03/24 2100   levETIRAcetam  (KEPPRA ) tablet 1,000 mg  1,000 mg Oral Daily Patsy Lenis, MD   1,000 mg at 08/03/24 1039   And   levETIRAcetam  (KEPPRA ) tablet 1,500 mg  1,500 mg Oral QHS Girguis, David, MD   1,500 mg at 08/03/24 2100   mupirocin  ointment (BACTROBAN ) 2 %   Nasal BID Patsy Lenis, MD   Given at 08/03/24 2100   nystatin  (MYCOSTATIN /NYSTOP ) topical powder 1 Application  1 Application Topical BID Patsy Lenis, MD   1 Application at 08/03/24 2100   Oral care mouth rinse  15 mL Mouth Rinse PRN Patsy Lenis, MD       Oral care mouth rinse  15 mL Mouth Rinse 4 times per day Patsy Lenis, MD   15 mL at 08/03/24 2100   perampanel  (FYCOMPA ) tablet 4 mg  4 mg Oral Daily Patsy Lenis, MD   4 mg at 08/03/24 1039   phenytoin  (DILANTIN ) chewable tablet 75 mg  75 mg Oral Daily Casimir Camelia RAMAN, RPH   75 mg at 08/03/24 1037   phenytoin  (DILANTIN ) ER capsule 100 mg  100 mg Oral QHS Casimir Camelia RAMAN, RPH   100 mg at 08/03/24 2100   sodium chloride  flush (NS) 0.9 % injection 3 mL  3 mL Intravenous Q12H Girguis, David, MD   3 mL at 08/03/24 2100     Discharge Medications: Please see discharge summary for a list of discharge medications.  Relevant Imaging Results:  Relevant Lab Results:   Additional Information SSN [id number]  Doneta Glenys DASEN, RN     "

## 2024-08-05 LAB — PHENYTOIN LEVEL, FREE AND TOTAL

## 2024-08-05 LAB — CULTURE, BLOOD (ROUTINE X 2): Culture: NO GROWTH

## 2024-08-06 LAB — CULTURE, BLOOD (ROUTINE X 2): Culture: NO GROWTH

## 2024-08-10 ENCOUNTER — Other Ambulatory Visit: Payer: Self-pay | Admitting: Student

## 2024-08-10 DIAGNOSIS — I10 Essential (primary) hypertension: Secondary | ICD-10-CM

## 2024-08-12 DIAGNOSIS — G809 Cerebral palsy, unspecified: Secondary | ICD-10-CM | POA: Diagnosis present

## 2024-08-12 DIAGNOSIS — Z833 Family history of diabetes mellitus: Secondary | ICD-10-CM

## 2024-08-12 DIAGNOSIS — R748 Abnormal levels of other serum enzymes: Secondary | ICD-10-CM | POA: Diagnosis present

## 2024-08-12 DIAGNOSIS — W228XXA Striking against or struck by other objects, initial encounter: Secondary | ICD-10-CM | POA: Diagnosis present

## 2024-08-12 DIAGNOSIS — Z79899 Other long term (current) drug therapy: Secondary | ICD-10-CM

## 2024-08-12 DIAGNOSIS — E876 Hypokalemia: Secondary | ICD-10-CM | POA: Diagnosis present

## 2024-08-12 DIAGNOSIS — G9341 Metabolic encephalopathy: Secondary | ICD-10-CM | POA: Diagnosis present

## 2024-08-12 DIAGNOSIS — R296 Repeated falls: Secondary | ICD-10-CM | POA: Diagnosis present

## 2024-08-12 DIAGNOSIS — E870 Hyperosmolality and hypernatremia: Secondary | ICD-10-CM | POA: Diagnosis present

## 2024-08-12 DIAGNOSIS — Z820 Family history of epilepsy and other diseases of the nervous system: Secondary | ICD-10-CM

## 2024-08-12 DIAGNOSIS — R680 Hypothermia, not associated with low environmental temperature: Secondary | ICD-10-CM | POA: Diagnosis present

## 2024-08-12 DIAGNOSIS — G40909 Epilepsy, unspecified, not intractable, without status epilepticus: Principal | ICD-10-CM | POA: Diagnosis present

## 2024-08-12 DIAGNOSIS — R54 Age-related physical debility: Secondary | ICD-10-CM | POA: Diagnosis present

## 2024-08-12 DIAGNOSIS — Z556 Problems related to health literacy: Secondary | ICD-10-CM

## 2024-08-12 DIAGNOSIS — E785 Hyperlipidemia, unspecified: Secondary | ICD-10-CM | POA: Diagnosis present

## 2024-08-12 DIAGNOSIS — W06XXXA Fall from bed, initial encounter: Secondary | ICD-10-CM | POA: Diagnosis present

## 2024-08-12 DIAGNOSIS — R338 Other retention of urine: Secondary | ICD-10-CM | POA: Diagnosis present

## 2024-08-12 DIAGNOSIS — Z809 Family history of malignant neoplasm, unspecified: Secondary | ICD-10-CM

## 2024-08-12 DIAGNOSIS — Z800 Family history of malignant neoplasm of digestive organs: Secondary | ICD-10-CM

## 2024-08-12 DIAGNOSIS — J45909 Unspecified asthma, uncomplicated: Secondary | ICD-10-CM | POA: Diagnosis present

## 2024-08-12 DIAGNOSIS — R4701 Aphasia: Secondary | ICD-10-CM | POA: Diagnosis present

## 2024-08-12 DIAGNOSIS — Z8249 Family history of ischemic heart disease and other diseases of the circulatory system: Secondary | ICD-10-CM

## 2024-08-12 DIAGNOSIS — F79 Unspecified intellectual disabilities: Secondary | ICD-10-CM | POA: Diagnosis present

## 2024-08-13 ENCOUNTER — Emergency Department (HOSPITAL_COMMUNITY)

## 2024-08-13 ENCOUNTER — Other Ambulatory Visit: Payer: Self-pay

## 2024-08-13 ENCOUNTER — Inpatient Hospital Stay (HOSPITAL_COMMUNITY)
Admission: EM | Admit: 2024-08-13 | Discharge: 2024-08-17 | DRG: 100 | Disposition: A | Discharge status: Skilled Nursing Facility | Source: Skilled Nursing Facility | Attending: Internal Medicine | Admitting: Internal Medicine

## 2024-08-13 DIAGNOSIS — E870 Hyperosmolality and hypernatremia: Secondary | ICD-10-CM | POA: Diagnosis present

## 2024-08-13 DIAGNOSIS — E785 Hyperlipidemia, unspecified: Secondary | ICD-10-CM | POA: Diagnosis present

## 2024-08-13 DIAGNOSIS — G40909 Epilepsy, unspecified, not intractable, without status epilepticus: Secondary | ICD-10-CM | POA: Diagnosis present

## 2024-08-13 DIAGNOSIS — G809 Cerebral palsy, unspecified: Secondary | ICD-10-CM

## 2024-08-13 DIAGNOSIS — R748 Abnormal levels of other serum enzymes: Secondary | ICD-10-CM | POA: Diagnosis present

## 2024-08-13 DIAGNOSIS — R400 Somnolence: Secondary | ICD-10-CM | POA: Diagnosis not present

## 2024-08-13 DIAGNOSIS — R4182 Altered mental status, unspecified: Secondary | ICD-10-CM | POA: Diagnosis not present

## 2024-08-13 DIAGNOSIS — Z800 Family history of malignant neoplasm of digestive organs: Secondary | ICD-10-CM | POA: Diagnosis not present

## 2024-08-13 DIAGNOSIS — T68XXXA Hypothermia, initial encounter: Principal | ICD-10-CM | POA: Diagnosis present

## 2024-08-13 DIAGNOSIS — R338 Other retention of urine: Secondary | ICD-10-CM | POA: Diagnosis present

## 2024-08-13 DIAGNOSIS — W228XXA Striking against or struck by other objects, initial encounter: Secondary | ICD-10-CM | POA: Diagnosis present

## 2024-08-13 DIAGNOSIS — E876 Hypokalemia: Secondary | ICD-10-CM

## 2024-08-13 DIAGNOSIS — G9341 Metabolic encephalopathy: Secondary | ICD-10-CM | POA: Diagnosis present

## 2024-08-13 DIAGNOSIS — Z556 Problems related to health literacy: Secondary | ICD-10-CM | POA: Diagnosis not present

## 2024-08-13 DIAGNOSIS — R680 Hypothermia, not associated with low environmental temperature: Secondary | ICD-10-CM | POA: Diagnosis present

## 2024-08-13 DIAGNOSIS — Z79899 Other long term (current) drug therapy: Secondary | ICD-10-CM | POA: Diagnosis not present

## 2024-08-13 DIAGNOSIS — R410 Disorientation, unspecified: Secondary | ICD-10-CM | POA: Diagnosis present

## 2024-08-13 DIAGNOSIS — Z820 Family history of epilepsy and other diseases of the nervous system: Secondary | ICD-10-CM | POA: Diagnosis not present

## 2024-08-13 DIAGNOSIS — R54 Age-related physical debility: Secondary | ICD-10-CM | POA: Diagnosis present

## 2024-08-13 DIAGNOSIS — Z809 Family history of malignant neoplasm, unspecified: Secondary | ICD-10-CM | POA: Diagnosis not present

## 2024-08-13 DIAGNOSIS — J45909 Unspecified asthma, uncomplicated: Secondary | ICD-10-CM | POA: Diagnosis present

## 2024-08-13 DIAGNOSIS — W06XXXA Fall from bed, initial encounter: Secondary | ICD-10-CM | POA: Diagnosis present

## 2024-08-13 DIAGNOSIS — Z8249 Family history of ischemic heart disease and other diseases of the circulatory system: Secondary | ICD-10-CM | POA: Diagnosis not present

## 2024-08-13 DIAGNOSIS — F79 Unspecified intellectual disabilities: Secondary | ICD-10-CM | POA: Diagnosis present

## 2024-08-13 DIAGNOSIS — Z833 Family history of diabetes mellitus: Secondary | ICD-10-CM | POA: Diagnosis not present

## 2024-08-13 DIAGNOSIS — R296 Repeated falls: Secondary | ICD-10-CM | POA: Diagnosis present

## 2024-08-13 DIAGNOSIS — R4701 Aphasia: Secondary | ICD-10-CM | POA: Diagnosis present

## 2024-08-13 LAB — AMMONIA: Ammonia: 34 umol/L (ref 9–35)

## 2024-08-13 LAB — URINALYSIS, W/ REFLEX TO CULTURE (INFECTION SUSPECTED)
Bacteria, UA: NONE SEEN
Bilirubin Urine: NEGATIVE
Glucose, UA: NEGATIVE mg/dL
Hgb urine dipstick: NEGATIVE
Ketones, ur: NEGATIVE mg/dL
Leukocytes,Ua: NEGATIVE
Nitrite: NEGATIVE
Protein, ur: NEGATIVE mg/dL
Specific Gravity, Urine: 1.019 (ref 1.005–1.030)
pH: 5 (ref 5.0–8.0)

## 2024-08-13 LAB — CORTISOL: Cortisol, Plasma: 11.3 ug/dL

## 2024-08-13 LAB — CBC WITH DIFFERENTIAL/PLATELET
Abs Immature Granulocytes: 0.01 K/uL (ref 0.00–0.07)
Basophils Absolute: 0 K/uL (ref 0.0–0.1)
Basophils Relative: 0 %
Eosinophils Absolute: 0.4 K/uL (ref 0.0–0.5)
Eosinophils Relative: 9 %
HCT: 34 % — ABNORMAL LOW (ref 36.0–46.0)
Hemoglobin: 10.6 g/dL — ABNORMAL LOW (ref 12.0–15.0)
Immature Granulocytes: 0 %
Lymphocytes Relative: 26 %
Lymphs Abs: 1.1 K/uL (ref 0.7–4.0)
MCH: 26.4 pg (ref 26.0–34.0)
MCHC: 31.2 g/dL (ref 30.0–36.0)
MCV: 84.6 fL (ref 80.0–100.0)
Monocytes Absolute: 0.3 K/uL (ref 0.1–1.0)
Monocytes Relative: 6 %
Neutro Abs: 2.4 K/uL (ref 1.7–7.7)
Neutrophils Relative %: 59 %
Platelets: 313 K/uL (ref 150–400)
RBC: 4.02 MIL/uL (ref 3.87–5.11)
RDW: 16.7 % — ABNORMAL HIGH (ref 11.5–15.5)
WBC: 4.1 K/uL (ref 4.0–10.5)
nRBC: 0 % (ref 0.0–0.2)

## 2024-08-13 LAB — COMPREHENSIVE METABOLIC PANEL WITH GFR
ALT: 20 U/L (ref 0–44)
AST: 29 U/L (ref 15–41)
Albumin: 3.9 g/dL (ref 3.5–5.0)
Alkaline Phosphatase: 211 U/L — ABNORMAL HIGH (ref 38–126)
Anion gap: 10 (ref 5–15)
BUN: 6 mg/dL (ref 6–20)
CO2: 32 mmol/L (ref 22–32)
Calcium: 10 mg/dL (ref 8.9–10.3)
Chloride: 99 mmol/L (ref 98–111)
Creatinine, Ser: 0.66 mg/dL (ref 0.44–1.00)
GFR, Estimated: >60 mL/min
Glucose, Bld: 118 mg/dL — ABNORMAL HIGH (ref 70–99)
Potassium: 3 mmol/L — ABNORMAL LOW (ref 3.5–5.1)
Sodium: 141 mmol/L (ref 135–145)
Total Bilirubin: 0.3 mg/dL (ref 0.0–1.2)
Total Protein: 8.8 g/dL — ABNORMAL HIGH (ref 6.5–8.1)

## 2024-08-13 LAB — BASIC METABOLIC PANEL WITH GFR
Anion gap: 16 — ABNORMAL HIGH (ref 5–15)
BUN: 6 mg/dL (ref 6–20)
CO2: 25 mmol/L (ref 22–32)
Calcium: 9.7 mg/dL (ref 8.9–10.3)
Chloride: 103 mmol/L (ref 98–111)
Creatinine, Ser: 0.84 mg/dL (ref 0.44–1.00)
GFR, Estimated: >60 mL/min
Glucose, Bld: 76 mg/dL (ref 70–99)
Potassium: 5.1 mmol/L (ref 3.5–5.1)
Sodium: 143 mmol/L (ref 135–145)

## 2024-08-13 LAB — CREATININE, SERUM
Creatinine, Ser: 0.65 mg/dL (ref 0.44–1.00)
GFR, Estimated: >60 mL/min

## 2024-08-13 LAB — CBC
HCT: 27.6 % — ABNORMAL LOW (ref 36.0–46.0)
Hemoglobin: 8.6 g/dL — ABNORMAL LOW (ref 12.0–15.0)
MCH: 26.4 pg (ref 26.0–34.0)
MCHC: 31.2 g/dL (ref 30.0–36.0)
MCV: 84.7 fL (ref 80.0–100.0)
Platelets: 276 K/uL (ref 150–400)
RBC: 3.26 MIL/uL — ABNORMAL LOW (ref 3.87–5.11)
RDW: 16.8 % — ABNORMAL HIGH (ref 11.5–15.5)
WBC: 5.2 K/uL (ref 4.0–10.5)
nRBC: 0 % (ref 0.0–0.2)

## 2024-08-13 LAB — MRSA NEXT GEN BY PCR, NASAL: MRSA by PCR Next Gen: DETECTED — AB

## 2024-08-13 LAB — I-STAT CG4 LACTIC ACID, ED: Lactic Acid, Venous: 0.8 mmol/L (ref 0.5–1.9)

## 2024-08-13 LAB — T4, FREE: Free T4: 0.85 ng/dL (ref 0.80–2.00)

## 2024-08-13 LAB — TSH: TSH: 3.35 u[IU]/mL (ref 0.350–4.500)

## 2024-08-13 LAB — CBG MONITORING, ED: Glucose-Capillary: 131 mg/dL — ABNORMAL HIGH (ref 70–99)

## 2024-08-13 LAB — GAMMA GT: GGT: 45 U/L (ref 7–50)

## 2024-08-13 MED ORDER — ONDANSETRON HCL 4 MG PO TABS: 4.0000 mg | ORAL_TABLET | Freq: Four times a day (QID) | ORAL | Status: DC | PRN

## 2024-08-13 MED ORDER — SENNA 8.6 MG PO TABS
1.0000 | ORAL_TABLET | Freq: Two times a day (BID) | ORAL | Status: DC | PRN
  Administered 2024-08-17: 8.6 mg via ORAL
  Filled 2024-08-13: qty 1

## 2024-08-13 MED ORDER — PERAMPANEL 2 MG PO TABS
4.0000 mg | ORAL_TABLET | Freq: Every day | ORAL | Status: DC
  Administered 2024-08-14 – 2024-08-17 (×4): 4 mg via ORAL
  Filled 2024-08-13 (×4): qty 2

## 2024-08-13 MED ORDER — LEVETIRACETAM 500 MG PO TABS
1000.0000 mg | ORAL_TABLET | Freq: Every day | ORAL | Status: DC
  Administered 2024-08-14 – 2024-08-17 (×4): 1000 mg via ORAL
  Filled 2024-08-13 (×4): qty 2

## 2024-08-13 MED ORDER — SODIUM CHLORIDE 0.9 % IV BOLUS (SEPSIS)
1000.0000 mL | Freq: Once | INTRAVENOUS | Status: AC
  Administered 2024-08-13: 1000 mL via INTRAVENOUS

## 2024-08-13 MED ORDER — MIRTAZAPINE 15 MG PO TABS
22.5000 mg | ORAL_TABLET | Freq: Every day | ORAL | Status: DC
  Administered 2024-08-13 – 2024-08-16 (×4): 22.5 mg via ORAL
  Filled 2024-08-13 (×4): qty 2

## 2024-08-13 MED ORDER — PANTOPRAZOLE SODIUM 40 MG PO TBEC
40.0000 mg | DELAYED_RELEASE_TABLET | Freq: Every day | ORAL | Status: DC
  Administered 2024-08-14 – 2024-08-17 (×4): 40 mg via ORAL
  Filled 2024-08-13 (×4): qty 1

## 2024-08-13 MED ORDER — CHLORHEXIDINE GLUCONATE CLOTH 2 % EX PADS
6.0000 | MEDICATED_PAD | Freq: Every day | CUTANEOUS | Status: DC
  Administered 2024-08-14 – 2024-08-15 (×2): 6 via TOPICAL

## 2024-08-13 MED ORDER — LORAZEPAM 2 MG/ML IJ SOLN
1.0000 mg | Freq: Once | INTRAMUSCULAR | Status: AC
  Administered 2024-08-13: 1 mg via INTRAVENOUS
  Filled 2024-08-13: qty 1

## 2024-08-13 MED ORDER — LACOSAMIDE 50 MG PO TABS
200.0000 mg | ORAL_TABLET | Freq: Two times a day (BID) | ORAL | Status: DC
  Administered 2024-08-14 – 2024-08-17 (×7): 200 mg via ORAL
  Filled 2024-08-13 (×7): qty 4

## 2024-08-13 MED ORDER — PHENYTOIN 50 MG PO CHEW
75.0000 mg | CHEWABLE_TABLET | Freq: Every day | ORAL | Status: DC
  Administered 2024-08-14 – 2024-08-17 (×4): 75 mg via ORAL
  Filled 2024-08-13 (×4): qty 1.5

## 2024-08-13 MED ORDER — ROSUVASTATIN CALCIUM 20 MG PO TABS
20.0000 mg | ORAL_TABLET | Freq: Every day | ORAL | Status: DC
  Administered 2024-08-13 – 2024-08-16 (×4): 20 mg via ORAL
  Filled 2024-08-13 (×4): qty 1

## 2024-08-13 MED ORDER — POLYETHYLENE GLYCOL 3350 17 G PO PACK
17.0000 g | PACK | Freq: Every day | ORAL | Status: DC | PRN
  Administered 2024-08-17: 17 g via ORAL
  Filled 2024-08-13: qty 1

## 2024-08-13 MED ORDER — LEVETIRACETAM 500 MG PO TABS
1500.0000 mg | ORAL_TABLET | Freq: Every day | ORAL | Status: DC
  Administered 2024-08-13 – 2024-08-16 (×4): 1500 mg via ORAL
  Filled 2024-08-13 (×4): qty 3

## 2024-08-13 MED ORDER — PHENAZOPYRIDINE HCL 100 MG PO TABS
100.0000 mg | ORAL_TABLET | Freq: Three times a day (TID) | ORAL | Status: DC | PRN
  Administered 2024-08-13: 100 mg via ORAL
  Filled 2024-08-13 (×2): qty 1

## 2024-08-13 MED ORDER — PHENYTOIN SODIUM EXTENDED 100 MG PO CAPS
100.0000 mg | ORAL_CAPSULE | Freq: Every day | ORAL | Status: DC
  Administered 2024-08-13 – 2024-08-16 (×4): 100 mg via ORAL
  Filled 2024-08-13 (×4): qty 1

## 2024-08-13 MED ORDER — ENOXAPARIN SODIUM 40 MG/0.4ML IJ SOSY
40.0000 mg | PREFILLED_SYRINGE | INTRAMUSCULAR | Status: DC
  Administered 2024-08-13 – 2024-08-16 (×4): 40 mg via SUBCUTANEOUS
  Filled 2024-08-13 (×4): qty 0.4

## 2024-08-13 MED ORDER — ORAL CARE MOUTH RINSE: 15.0000 mL | OROMUCOSAL | Status: DC | PRN

## 2024-08-13 NOTE — Plan of Care (Signed)
  Problem: Clinical Measurements: Goal: Ability to maintain clinical measurements within normal limits will improve Outcome: Progressing Goal: Will remain free from infection Outcome: Progressing Goal: Diagnostic test results will improve Outcome: Progressing Goal: Respiratory complications will improve Outcome: Progressing   Problem: Education: Goal: Knowledge of General Education information will improve Description: Including pain rating scale, medication(s)/side effects and non-pharmacologic comfort measures Outcome: Not Progressing   Problem: Health Behavior/Discharge Planning: Goal: Ability to manage health-related needs will improve Outcome: Not Progressing

## 2024-08-13 NOTE — ED Triage Notes (Signed)
 Pt BIB [facility] from [facility]. [facility] staff reports pt had 2 falls today. Pt had a unwitnessed fall off a bed hitting her head. Pt had second fall hitting a door and laying in the doorway. Pt c/o posterior head and neck pain.  Hx cerebral palsy Non verbal No thinners  148/96 60HR 98% RA 144CBG

## 2024-08-13 NOTE — Consult Note (Signed)
 "  NAME:  Patricia White, MRN:  [id number], DOB:  10/13/1970, LOS: 0 ADMISSION DATE:  08/13/2024, CONSULTATION DATE:  08/13/24 REFERRING MD:  EDP, CHIEF COMPLAINT:  falls  History of Present Illness:  53 yo female with recent hospitalization admission and d/c on 12/22 presented from [facility] after 2 falls. Pt reportedly had unwitnessed fall off bed hitting her head. She then had second fall hitting her head on a door and laying in the hallway. Per edp pt was complaining of posterior head pain and neck pain. However pt is also non verbal at baseline.   Pt was found to be hypothermic but attempts to place bair hugger resulted in agitation of patient and her removing equipment in ed. Of note pt has had multiple admissions this year for ams, hypothermia and sz. During all hospitalizations pt slowly returned to baseline without significant intervention with exception of holding dilantin  (2/2 supratherapeutic level) per d/c summary.   Cth and neck were thankfully negative for acute process. Cxr negative for pneumonia. No clear sepsis.     Pertinent  Medical History  Cerebral palsy Sz d/o Gerd hyperlipidemia  Significant Hospital Events: Including procedures, antibiotic start and stop dates in addition to other pertinent events     Interim History / Subjective:    Objective    Blood pressure (!) 144/92, pulse 65, temperature (!) 89.4 F (31.9 C), resp. rate 11, SpO2 91%.       No intake or output data in the 24 hours ending 08/13/24 0420 There were no vitals filed for this visit.  Examination: General: nad, sleeping soundly in bed under warming blanket HENT: ncat, perrla, poor dentition, sclera injected Lungs: ctab Cardiovascular: rrr Abdomen: soft, nt,nd bs+ Extremities: no c/c/e Neuro: moves all 4 spontaneously, not following commands, withdraws to tactile stim GU: deferred  Resolved problem list   Assessment and Plan  Fall, unclear  etiology Hypothermia Ams, agitated delirium H/o sz d/o Cerebral palsy Hypokalemia Elevated alk phos -thankfully cth/neck neg -unclear why pt continues to present repeatedly with hypothermia -temp improved with warming blanket, will give warm ivf as well -will check tsh/t4 as previously have been abnormal -add urine cx but does not appear septic -dilantin  level pending -improved with 1x dose ativan  -if mental status does not return to baseline, without agitation consider repeat imaging +/- eeg -no sz activity noted at facility or at hospital -baseline CP -replace K -check ggt   Labs   CBC: Recent Labs  Lab 08/13/24 0054  WBC 4.1  NEUTROABS 2.4  HGB 10.6*  HCT 34.0*  MCV 84.6  PLT 313    Basic Metabolic Panel: Recent Labs  Lab 08/13/24 0054  NA 141  K 3.0*  CL 99  CO2 32  GLUCOSE 118*  BUN 6  CREATININE 0.66  CALCIUM  10.0   GFR: Estimated Creatinine Clearance: 74.9 mL/min (by C-G formula based on SCr of 0.66 mg/dL). Recent Labs  Lab 08/13/24 0054 08/13/24 0100  WBC 4.1  --   LATICACIDVEN  --  0.8    Liver Function Tests: Recent Labs  Lab 08/13/24 0054  AST 29  ALT 20  ALKPHOS 211*  BILITOT 0.3  PROT 8.8*  ALBUMIN 3.9   No results for input(s): LIPASE, AMYLASE in the last 168 hours. No results for input(s): AMMONIA in the last 168 hours.  ABG    Component Value Date/Time   HCO3 25.4 07/31/2024 2043   TCO2 30 12/27/2023 1834   ACIDBASEDEF 0.5 07/31/2024  2043   O2SAT 94.7 07/31/2024 2043     Coagulation Profile: No results for input(s): INR, PROTIME in the last 168 hours.  Cardiac Enzymes: No results for input(s): CKTOTAL, CKMB, CKMBINDEX, TROPONINI in the last 168 hours.  HbA1C: Hemoglobin A1C  Date/Time Value Ref Range Status  09/22/2021 02:47 PM 5.0 4.0 - 5.6 % Final   HbA1c, POC (controlled diabetic range)  Date/Time Value Ref Range Status  02/12/2023 02:04 PM 5.7 0.0 - 7.0 % Final  06/29/2021 10:30 AM 4.9  0.0 - 7.0 % Final   Hgb A1c MFr Bld  Date/Time Value Ref Range Status  11/23/2023 12:19 PM 5.3 4.8 - 5.6 % Final    Comment:    (NOTE) Pre diabetes:          5.7%-6.4%  Diabetes:              >6.4%  Glycemic control for   <7.0% adults with diabetes     CBG: Recent Labs  Lab 08/13/24 0035  GLUCAP 131*    Review of Systems:   Unobtainable 2/2 pt's non verbal status   Past Medical History:  She,  has a past medical history of Asthma, Cerebral palsy (HCC) (Birth), Mental retardation (Birth), 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   Surgical History:   Past Surgical History:  Procedure Laterality Date   ESOPHAGOGASTRODUODENOSCOPY N/A 11/08/2023   Procedure: EGD (ESOPHAGOGASTRODUODENOSCOPY);  Surgeon: San Sandor GAILS, DO;  Location: [facility] ENDOSCOPY;  Service: Gastroenterology;  Laterality: N/A;   POLYPECTOMY  11/08/2023   Procedure: POLYPECTOMY;  Surgeon: San Sandor GAILS, DO;  Location: [facility] ENDOSCOPY;  Service: Gastroenterology;;     Social History:   reports that she has never smoked. She has never used smokeless tobacco. She reports that she does not drink alcohol and does not use drugs.   Family History:  Her family history includes Cancer in her mother; Colon cancer (age of onset: 32) in her maternal grandfather; Diabetes in her maternal aunt; Hypertension in her maternal grandmother; Migraines in her mother; Prostate cancer in her maternal grandfather; Throat cancer in her maternal grandfather.   Allergies Allergies[1]   Home Medications  Prior to Admission medications  Medication Sig Start Date End Date Taking? Authorizing Provider  acetaminophen  (TYLENOL ) 325 MG tablet Take 2 tablets (650 mg total) by mouth every 6 (six) hours as needed for mild pain (pain score 1-3) or fever. 02/20/24   Alba Sharper, MD  clonazePAM  (KLONOPIN ) 0.5 MG tablet Take 1 tablet (0.5 mg total) by mouth 3 (three) times daily. 08/04/24   Patsy Lenis, MD  KEPPRA  500 MG  tablet Take 1,000-1,500 mg by mouth See admin instructions. Take 1,000 mg by mouth in the morning and 1,500 mg at bedtime    [provider]  lacosamide  (VIMPAT ) 200 MG TABS tablet Take 1 tablet (200 mg total) by mouth 2 (two) times daily. 05/06/24   Tharon Lung, MD  melatonin 3 MG TABS tablet Take 3 mg by mouth at bedtime.    [provider]  mirtazapine (REMERON) 7.5 MG tablet Take 22.5 mg by mouth at bedtime.    [provider]  nystatin  powder Apply 1 Application topically See admin instructions. Apply under both breasts 2 times a day until healed    [provider]  ondansetron  (ZOFRAN ) 4 MG tablet Take 4 mg by mouth every 6 (six) hours as needed for nausea or vomiting (may be crushed and mixed into 5 ml's of water if needed- to digest).  [provider]  pantoprazole  (PROTONIX ) 40 MG tablet Take 1 tablet (40 mg total) by mouth daily. Patient taking differently: Take 40 mg by mouth daily before breakfast. 02/20/24   Alba Sharper, MD  Perampanel  4 MG TABS Take 1 tablet (4 mg total) by mouth daily. 08/04/24   Patsy Lenis, MD  phenytoin  (DILANTIN ) 100 MG ER capsule Take 1 capsule (100 mg total) by mouth at bedtime. 08/04/24   Patsy Lenis, MD  phenytoin  (DILANTIN ) 50 MG tablet Chew 1.5 tablets (75 mg total) by mouth daily. 08/04/24   Patsy Lenis, MD  polyethylene glycol (MIRALAX  / GLYCOLAX ) 17 g packet Take 17 g by mouth daily. 02/20/24   Alba Sharper, MD  QUEtiapine (SEROQUEL) 25 MG tablet Take 12.5 mg by mouth at bedtime. 04/29/24   [provider]  rosuvastatin  (CRESTOR ) 20 MG tablet Take 1 tablet (20 mg total) by mouth daily. Patient taking differently: Take 20 mg by mouth at bedtime. 02/20/24   Alba Sharper, MD  senna-docusate (SENOKOT-S) 8.6-50 MG tablet Take 1 tablet by mouth daily. 02/20/24   Alba Sharper, MD     Critical care time:               [1] No Known Allergies  "

## 2024-08-13 NOTE — H&P (Signed)
 "  NAME:  Patricia White, MRN:  [id number], DOB:  1970/12/18, LOS: 0 ADMISSION DATE:  08/13/2024, CONSULTATION DATE:  12/31 REFERRING MD:  EDP, CHIEF COMPLAINT:  fall, hypothermia   History of Present Illness:  53 yo female with recent hospitalization admission and d/c on 12/22 presented from [facility] after 2 falls. Pt reportedly had unwitnessed fall off bed hitting her head. She then had second fall hitting her head on a door and laying in the hallway. Per edp pt was complaining of posterior head pain and neck pain. However pt is also non verbal at baseline.    Pt was found to be hypothermic but attempts to place bair hugger resulted in agitation of patient and her removing equipment in ed. Of note pt has had multiple admissions this year for ams, hypothermia and sz. During all hospitalizations pt slowly returned to baseline without significant intervention with exception of holding dilantin  (2/2 supratherapeutic level) per d/c summary.    Cth and neck were thankfully negative for acute process. Cxr negative for pneumonia. No clear sepsis.   Pertinent  Medical History  CP, seizure disorder, GERD, hyperlipidemia,   Significant Hospital Events: Including procedures, antibiotic start and stop dates in addition to other pertinent events   12/31: admitted from facility for falls, ams, hypothermia  Interim History / Subjective:  On arrival to ICU she is awake, in no distress   Objective   Blood pressure 106/69, pulse (!) 109, temperature 98.4 F (36.9 C), temperature source Rectal, resp. rate 18, SpO2 100%.       No intake or output data in the 24 hours ending 08/13/24 1552 There were no vitals filed for this visit.  Examination: Middle aged female, chronically ill appearing  Tells me her name, follows commands  S1s2 no murmur Lungs clear  Abdomen soft  No edema, warm   Resolved Hospital Problem list     Assessment & Plan:  Fall  Hypothermia AMS  Seizure  disorder  Cerebral palsy  Hypokalemia  Elevated alk phos    On arrival to ICU, patient normothermic and off of bair hugger.  CT head and c-pine negative    Query if she had seizure again causing falls.  TSH and T4 normal  Phenytoin  level pending    Unclear etiology of her hypothermia thus far. She has had CT and MRI imaging without any hypothalamic issues. Could be seizure itself. Other etiologies could be, not limited to, her Seroquel, malnutrition, adrenal insufficiency, thiamine  deficiency her benzodiazepines    - add keppra  level  - check cortisol, ACTH, thiamine  level - recheck bmp, not sure K was repleted  - restart anti-epileptics: keppra , vimpat , dilantin , fycompa  (ordered for later tonight) - consider restarting clonazepam  tomorrow ?? I have not restarted at this time or her seroquel for hypothermia concern and prolonged QTc - does not really look malnourished?  - no evidence of current seizure activity do not feel she needs EEG at this time  - neuro checks - seizure precautions  - Qtc is 524 - EKG tomorrow morning with multiple QTC prolonging agents ordered (home meds). Hold seroquel.    She is overall stable, on room air, hemodynamically stable, normothermic without severe derangement in lab.  She currently has no ICU indications    Will sign out to [facility] for pick up tomorrow.   Labs   CBC: Recent Labs  Lab 08/13/24 0054 08/13/24 0731  WBC 4.1 5.2  NEUTROABS 2.4  --   HGB  10.6* 8.6*  HCT 34.0* 27.6*  MCV 84.6 84.7  PLT 313 276    Basic Metabolic Panel: Recent Labs  Lab 08/13/24 0054 08/13/24 0731  NA 141  --   K 3.0*  --   CL 99  --   CO2 32  --   GLUCOSE 118*  --   BUN 6  --   CREATININE 0.66 0.65  CALCIUM  10.0  --    GFR: Estimated Creatinine Clearance: 74.9 mL/min (by C-G formula based on SCr of 0.65 mg/dL). Recent Labs  Lab 08/13/24 0054 08/13/24 0100 08/13/24 0731  WBC 4.1  --  5.2  LATICACIDVEN  --  0.8  --     Liver Function  Tests: Recent Labs  Lab 08/13/24 0054  AST 29  ALT 20  ALKPHOS 211*  BILITOT 0.3  PROT 8.8*  ALBUMIN 3.9   No results for input(s): LIPASE, AMYLASE in the last 168 hours. Recent Labs  Lab 08/13/24 0453  AMMONIA 34    ABG    Component Value Date/Time   HCO3 25.4 07/31/2024 2043   TCO2 30 12/27/2023 1834   ACIDBASEDEF 0.5 07/31/2024 2043   O2SAT 94.7 07/31/2024 2043     Coagulation Profile: No results for input(s): INR, PROTIME in the last 168 hours.  Cardiac Enzymes: No results for input(s): CKTOTAL, CKMB, CKMBINDEX, TROPONINI in the last 168 hours.  HbA1C: Hemoglobin A1C  Date/Time Value Ref Range Status  09/22/2021 02:47 PM 5.0 4.0 - 5.6 % Final   HbA1c, POC (controlled diabetic range)  Date/Time Value Ref Range Status  02/12/2023 02:04 PM 5.7 0.0 - 7.0 % Final  06/29/2021 10:30 AM 4.9 0.0 - 7.0 % Final   Hgb A1c MFr Bld  Date/Time Value Ref Range Status  11/23/2023 12:19 PM 5.3 4.8 - 5.6 % Final    Comment:    (NOTE) Pre diabetes:          5.7%-6.4%  Diabetes:              >6.4%  Glycemic control for   <7.0% adults with diabetes     CBG: Recent Labs  Lab 08/13/24 0035  GLUCAP 131*    Review of Systems:   As above   Past Medical History:  She,  has a past medical history of Asthma, Cerebral palsy (HCC) (Birth), Mental retardation (Birth), 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   Surgical History:   Past Surgical History:  Procedure Laterality Date   ESOPHAGOGASTRODUODENOSCOPY N/A 11/08/2023   Procedure: EGD (ESOPHAGOGASTRODUODENOSCOPY);  Surgeon: San Sandor GAILS, DO;  Location[facility] END[facility]COPY;  Service: Gastroenterology;  Laterality: N/A;   POLYPECTOMY  11/08/2023   Procedure: POLYPECTOMY;  Surgeon: San Sandor GAILS, DO;  Location: [facility] ENDOSCOPY;  Service: Gastroenterology;;     Social History:   reports that she has never smoked. She has never used smokeless tobacco. She reports that she does not drink  alcohol and does not use drugs.   Family History:  Her family history includes Cancer in her mother; Colon cancer (age of onset: 90) in her maternal grandfather; Diabetes in her maternal aunt; Hypertension in her maternal grandmother; Migraines in her mother; Prostate cancer in her maternal grandfather; Throat cancer in her maternal grandfather.   Allergies Allergies[1]   Home Medications  Prior to Admission medications  Medication Sig Start Date End Date Taking? Authorizing Provider  acetaminophen  (TYLENOL ) 325 MG tablet Take 2 tablets (650 mg total) by mouth every 6 (six) hours as needed for mild pain (  pain score 1-3) or fever. 02/20/24  Yes Alba Sharper, MD  clonazePAM  (KLONOPIN ) 0.5 MG tablet Take 1 tablet (0.5 mg total) by mouth 3 (three) times daily. 08/04/24  Yes Patsy Lenis, MD  KEPPRA  500 MG tablet Take 1,000-1,500 mg by mouth See admin instructions. Take 1,000 mg by mouth in the morning and 1,500 mg at bedtime   Yes [provider]  lacosamide  (VIMPAT ) 200 MG TABS tablet Take 1 tablet (200 mg total) by mouth 2 (two) times daily. 05/06/24  Yes Mabe, Elna, MD  melatonin 3 MG TABS tablet Take 3 mg by mouth at bedtime.   Yes [provider]  mirtazapine (REMERON) 7.5 MG tablet Take 22.5 mg by mouth at bedtime.   Yes [provider]  nystatin  powder Apply 1 Application topically See admin instructions. Apply under both breasts 2 times a day until healed   Yes [provider]  ondansetron  (ZOFRAN ) 4 MG tablet Take 4 mg by mouth every 6 (six) hours as needed for nausea or vomiting (may be crushed and mixed into 5 ml's of water if needed- to digest).   Yes [provider]  pantoprazole  (PROTONIX ) 40 MG tablet Take 1 tablet (40 mg total) by mouth daily. Patient taking differently: Take 40 mg by mouth daily before breakfast. 02/20/24  Yes Alba Sharper, MD  Perampanel  4 MG TABS Take 1 tablet (4 mg total) by mouth daily. 08/04/24  Yes Patsy Lenis, MD  phenytoin  (DILANTIN ) 100 MG ER capsule Take 1 capsule (100 mg total) by mouth at bedtime. 08/04/24  Yes Patsy Lenis, MD  phenytoin  (DILANTIN ) 50 MG tablet Chew 1.5 tablets (75 mg total) by mouth daily. 08/04/24  Yes Patsy Lenis, MD  polyethylene glycol (MIRALAX  / GLYCOLAX ) 17 g packet Take 17 g by mouth daily. 02/20/24  Yes Alba Sharper, MD  rosuvastatin  (CRESTOR ) 20 MG tablet Take 1 tablet (20 mg total) by mouth daily. Patient taking differently: Take 20 mg by mouth at bedtime. 02/20/24  Yes Alba Sharper, MD  QUEtiapine (SEROQUEL) 25 MG tablet Take 12.5 mg by mouth at bedtime. Patient not taking: Reported on 08/13/2024 04/29/24   [provider]  senna-docusate (SENOKOT-S) 8.6-50 MG tablet Take 1 tablet by mouth daily. Patient not taking: Reported on 08/13/2024 02/20/24   Alba Sharper, MD     Critical care time: na   Tinnie FORBES Adolph DEVONNA [facility] 08/13/2024 3:54 PM  Please see [email or web address] for pager details.  From 7A-7P if no response, please call [phone] After hours, please call ELink [phone]      [1] No Known Allergies  "

## 2024-08-13 NOTE — ED Notes (Signed)
 Patient removing warming blanket and monitoring equipment.  Appears to be agitated.  MD made aware

## 2024-08-13 NOTE — ED Notes (Signed)
 Patient medicated to help with agitation.  Monitoring equipment replaced and warming blanket placed over patient after patient calmed down.

## 2024-08-13 NOTE — ED Notes (Signed)
 Bear hugger placed on pt. MD at bedside

## 2024-08-13 NOTE — ED Notes (Signed)
 Pt rectal temp 98.3. Bare hugger turned off.

## 2024-08-13 NOTE — ED Provider Notes (Signed)
 " [place] EMERGENCY DEPARTMENT AT [facility] Provider Note   CSN: [id number] Arrival date & time: 08/12/24  2357     Patient presents with: Fall and altered mental status  Level 5 due to altered mental status Patricia White is a 53 y.o. female.   The history is provided by the nursing home. The history is limited by the condition of the patient.  Patient with history of cerebral palsy, intellectual disability, seizures presents for altered mental status, falls Per EMS and nursing home report, patient has had 2 falls today.  They were unwitnessed.  Patient has altered mental status at baseline but appears to be worse. 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 Authorizing Provider  acetaminophen  (TYLENOL ) 325 MG tablet Take 2 tablets (650 mg total) by mouth every 6 (six) hours as needed for mild pain (pain score 1-3) or fever. 02/20/24   Alba Sharper, MD  clonazePAM  (KLONOPIN ) 0.5 MG tablet Take 1 tablet (0.5 mg total) by mouth 3 (three) times daily. 08/04/24   Patsy Lenis, MD  KEPPRA  500 MG tablet Take 1,000-1,500 mg by mouth See admin instructions. Take 1,000 mg by mouth in the morning and 1,500 mg at bedtime    [provider]  lacosamide  (VIMPAT ) 200 MG TABS tablet Take 1 tablet (200 mg total) by mouth 2 (two) times daily. 05/06/24   Tharon Lung, MD  melatonin 3 MG TABS tablet Take 3 mg by mouth at bedtime.    [provider]  mirtazapine (REMERON) 7.5 MG tablet Take 22.5 mg by mouth at bedtime.    [provider]  nystatin  powder Apply 1 Application topically See admin instructions. Apply under both breasts 2 times a day until healed    [provider]  ondansetron  (ZOFRAN ) 4 MG tablet Take 4  mg by mouth every 6 (six) hours as needed for nausea or vomiting (may be crushed and mixed into 5 ml's of water if needed- to digest).    [provider]  pantoprazole  (PROTONIX ) 40 MG tablet Take 1 tablet (40 mg total) by mouth daily. Patient taking differently: Take 40 mg by mouth daily before breakfast. 02/20/24   Alba Sharper, MD  Perampanel  4 MG TABS Take 1 tablet (4 mg total) by mouth daily. 08/04/24   Patsy Lenis, MD  phenytoin  (DILANTIN ) 100 MG ER capsule Take 1 capsule (100 mg total) by mouth at bedtime. 08/04/24   Patsy Lenis, MD  phenytoin  (DILANTIN ) 50 MG tablet Chew 1.5 tablets (75 mg total) by mouth daily. 08/04/24   Patsy Lenis, MD  polyethylene glycol (MIRALAX  / GLYCOLAX ) 17 g packet Take 17 g by mouth daily. 02/20/24   Alba Sharper, MD  QUEtiapine (SEROQUEL) 25 MG tablet Take 12.5 mg by mouth at bedtime. 04/29/24   [provider]  rosuvastatin  (CRESTOR ) 20 MG tablet Take 1 tablet (20 mg total) by mouth daily. Patient taking differently: Take 20 mg by mouth at bedtime. 02/20/24   Alba Sharper, MD  senna-docusate (SENOKOT-S) 8.6-50 MG tablet Take 1 tablet by mouth daily. 02/20/24   Quillen, Michael, MD    Allergies: Patient has no known allergies.    Review of Systems  Unable to perform ROS: Mental  status change    Updated Vital Signs BP 92/70   Pulse (!) 56   Temp (!) 89.7 F (32.1 C)   Resp (!) 9   SpO2 99%   Physical Exam CONSTITUTIONAL: Ill-appearing HEAD: Normocephalic/atraumatic, no signs of trauma EYES: EOMI/PERRL, pupils pinpoint, no nystagmus ENMT: Mucous membranes moist NECK: supple no meningeal signs SPINE/BACK:entire spine nontender No bruising/crepitance/stepoffs noted to spine CV: S1/S2 noted, no murmurs/rubs/gallops noted LUNGS: Lungs are clear to auscultation bilaterally, no apparent distress ABDOMEN: soft, nontender NEURO: Pt is somnolent but arousable, she follows all commands.  She moves all 4  extremities EXTREMITIES: pulses normal/equal, full ROM Pelvis stable All other extremities/joints palpated/ranged and nontender SKIN: Skin is cool to touch  (all labs ordered are listed, but only abnormal results are displayed) Labs Reviewed  COMPREHENSIVE METABOLIC PANEL WITH GFR - Abnormal; Notable for the following components:      Result Value   Potassium 3.0 (*)    Glucose, Bld 118 (*)    Total Protein 8.8 (*)    Alkaline Phosphatase 211 (*)    All other components within normal limits  CBC WITH DIFFERENTIAL/PLATELET - Abnormal; Notable for the following components:   Hemoglobin 10.6 (*)    HCT 34.0 (*)    RDW 16.7 (*)    All other components within normal limits  CBG MONITORING, ED - Abnormal; Notable for the following components:   Glucose-Capillary 131 (*)    All other components within normal limits  CULTURE, BLOOD (ROUTINE X 2)  CULTURE, BLOOD (ROUTINE X 2)  URINALYSIS, W/ REFLEX TO CULTURE (INFECTION SUSPECTED)  PHENYTOIN  LEVEL, FREE AND TOTAL  AMMONIA  I-STAT CG4 LACTIC ACID, ED    EKG: EKG Interpretation Date/Time:  Wednesday August 13 2024 02:28:28 EST Ventricular Rate:  66 PR Interval:  145 QRS Duration:  109 QT Interval:  500 QTC Calculation: 524 R Axis:   74  Text Interpretation: Sinus rhythm Abnormal R-wave progression, early transition Confirmed by Midge Golas ([id number]) on 08/13/2024 2:48:48 AM  Radiology: CT Cervical Spine Wo Contrast Result Date: 08/13/2024 EXAM: CT CERVICAL SPINE WITHOUT CONTRAST 08/13/2024 01:59:14 AM TECHNIQUE: CT of the cervical spine was performed without the administration of intravenous contrast. Multiplanar reformatted images are provided for review. Automated exposure control, iterative reconstruction, and/or weight based adjustment of the mA/kV was utilized to reduce the radiation dose to as low as reasonably achievable. COMPARISON: None available. CLINICAL HISTORY: Neck trauma, intoxicated or obtunded (Age >= 16y)  FINDINGS: BONES AND ALIGNMENT: No acute fracture or traumatic malalignment. DEGENERATIVE CHANGES: Discontinuous anterior osteophytes at C5 and C6. SOFT TISSUES: No prevertebral soft tissue swelling. IMPRESSION: 1. No evidence of acute traumatic injury. Electronically signed by: Franky Stanford MD 08/13/2024 02:14 AM EST RP Workstation: [id number]   CT Head Wo Contrast Result Date: 08/13/2024 EXAM: CT HEAD WITHOUT CONTRAST 08/13/2024 01:59:14 AM TECHNIQUE: CT of the head was performed without the administration of intravenous contrast. Automated exposure control, iterative reconstruction, and/or weight based adjustment of the mA/kV was utilized to reduce the radiation dose to as low as reasonably achievable. COMPARISON: 07/31/2024 CLINICAL HISTORY: Head trauma, moderate-severe FINDINGS: BRAIN AND VENTRICLES: No acute hemorrhage. No evidence of acute infarct. No hydrocephalus. No extra-axial collection. No mass effect or midline shift. ORBITS: No acute abnormality. SINUSES: Mild mucosal thickening in frontal and ethmoid air cells. SOFT TISSUES AND SKULL: Debris in bilateral external auditory canals. No acute soft tissue abnormality. No skull fracture. IMPRESSION: 1. No acute intracranial abnormality. Electronically signed by: Franky Stanford MD 08/13/2024  02:06 AM EST RP Workstation: [id number]   DG Chest Port 1 View Result Date: 08/13/2024 EXAM: 1 VIEW(S) XRAY OF THE CHEST 08/13/2024 01:12:00 AM COMPARISON: 07/31/2024 CLINICAL HISTORY: Questionable sepsis - evaluate for abnormality FINDINGS: LUNGS AND PLEURA: Low lung volumes. Elevated left hemidiaphragm. Left lower lung zone linear atelectasis. No pleural effusion. No pneumothorax. HEART AND MEDIASTINUM: No acute abnormality of the cardiac and mediastinal silhouettes. BONES AND SOFT TISSUES: No acute osseous abnormality. IMPRESSION: 1. No acute findings. Electronically signed by: Greig Pique MD 08/13/2024 01:39 AM EST RP Workstation: [id number]     .Critical  Care  Performed by: Midge Golas, MD Authorized by: Midge Golas, MD   Critical care provider statement:    Critical care time (minutes):  105   Critical care start time:  08/13/2024 1:00 AM   Critical care end time:  08/13/2024 2:45 AM   Critical care time was exclusive of:  Separately billable procedures and treating other patients   Critical care was necessary to treat or prevent imminent or life-threatening deterioration of the following conditions:  Sepsis, CNS failure or compromise, dehydration, endocrine crisis and metabolic crisis   Critical care was time spent personally by me on the following activities:  Obtaining history from patient or surrogate, examination of patient, evaluation of patient's response to treatment, re-evaluation of patient's condition, ordering and review of radiographic studies, ordering and review of laboratory studies, pulse oximetry, ordering and performing treatments and interventions, development of treatment plan with patient or surrogate and review of old charts   I assumed direction of critical care for this patient from another provider in my specialty: no     Care discussed with: admitting provider      Medications Ordered in the ED  LORazepam  (ATIVAN ) injection 1 mg (1 mg Intravenous Given 08/13/24 0340)    Clinical Course as of 08/13/24 0635  Wed Aug 13, 2024  0108 Patient history of CP, and intellectual disability presents from nursing home after 2 falls On arrival patient found to be hypothermic to 89.  Glucose normal.  She is somnolent but easily arousable and protecting her airway No obvious signs of trauma but she is a poor historian  Nursing home reports patient fell twice but is usually more alert at her baseline  Per records, patient is had hypothermia and AMS previously.  She is on multiple AEDs for seizures including phenytoin  Imaging and labs are pending at this time.  Patient will require admission. Sepsis is felt less  likely as this is a recurrent issue and lactate is normal Will defer antibiotics for now [DW]  0323 Patient becoming more agitated, will give Ativan .  Will consult ICU [DW]  [phone] Discussed with Dr. Layman, will evaluate the patient [DW]  [phone] Patient blood pressure and temperature are slowly rising.  She will be admitted to the critical care service [DW]    Clinical Course User Index [DW] Midge Golas, MD                                 Medical Decision Making Amount and/or Complexity of Data Reviewed Labs: ordered. Radiology: ordered.  Risk Prescription drug management. Decision regarding hospitalization.   This patient presents to the ED for concern of altered mental status and hypothermia, this involves an extensive number of treatment options, and is a complaint that carries with it a high risk of complications and morbidity.  The differential diagnosis includes but is  not limited to CVA, intracranial hemorrhage, acute coronary syndrome, renal failure, urinary tract infection, electrolyte disturbance, pneumonia, sepsis, hypoglycemia, hypothyroid, cold exposure, drug intoxication    Comorbidities that complicate the patient evaluation: Patients presentation is complicated by their history of cerebral palsy  Social Determinants of Health: Patients poor health literacy, nursing home patient  increases the complexity of managing their presentation  Additional history obtained: Additional history obtained from nursing home/care facility Records reviewed previous admission documents  Lab Tests: I Ordered, and personally interpreted labs.  The pertinent results include: Mild anemia  Imaging Studies ordered: I ordered imaging studies including CT scan head and C-spine  I independently visualized and interpreted imaging which showed no acute findings I agree with the radiologist interpretation  Cardiac Monitoring: The patient was maintained on a cardiac monitor.  I  personally viewed and interpreted the cardiac monitor which showed an underlying rhythm of:  sinus rhythm  Medicines ordered and prescription drug management: I ordered medication including Ativan  for agitation Reevaluation of the patient after these medicines showed that the patient    improved  Critical Interventions:   external warming techniques, admission  Consultations Obtained: I requested consultation with the consultant critical care, and discussed  findings as well as pertinent plan - they recommend: Evaluate for admission  Reevaluation: After the interventions noted above, I reevaluated the patient and found that they have :improved  Complexity of problems addressed: Patients presentation is most consistent with  acute presentation with potential threat to life or bodily function  Disposition: After consideration of the diagnostic results and the patients response to treatment,  I feel that the patent would benefit from admission  .        Final diagnoses:  Hypothermia, initial encounter    ED Discharge Orders     None          Midge Golas, MD 08/13/24 [phone]  "

## 2024-08-14 ENCOUNTER — Encounter (HOSPITAL_COMMUNITY): Payer: Self-pay | Admitting: Critical Care Medicine

## 2024-08-14 DIAGNOSIS — G9341 Metabolic encephalopathy: Secondary | ICD-10-CM | POA: Diagnosis not present

## 2024-08-14 DIAGNOSIS — T68XXXA Hypothermia, initial encounter: Secondary | ICD-10-CM | POA: Diagnosis not present

## 2024-08-14 LAB — BASIC METABOLIC PANEL WITH GFR
Anion gap: 20 — ABNORMAL HIGH (ref 5–15)
BUN: 6 mg/dL (ref 6–20)
CO2: 31 mmol/L (ref 22–32)
Calcium: 9 mg/dL (ref 8.9–10.3)
Chloride: 98 mmol/L (ref 98–111)
Creatinine, Ser: 0.92 mg/dL (ref 0.44–1.00)
GFR, Estimated: >60 mL/min
Glucose, Bld: 88 mg/dL (ref 70–99)
Potassium: 3.9 mmol/L (ref 3.5–5.1)
Sodium: 149 mmol/L — ABNORMAL HIGH (ref 135–145)

## 2024-08-14 LAB — MAGNESIUM: Magnesium: 1.9 mg/dL (ref 1.7–2.4)

## 2024-08-14 LAB — CBC
HCT: 29.7 % — ABNORMAL LOW (ref 36.0–46.0)
Hemoglobin: 9.2 g/dL — ABNORMAL LOW (ref 12.0–15.0)
MCH: 25.6 pg — ABNORMAL LOW (ref 26.0–34.0)
MCHC: 31 g/dL (ref 30.0–36.0)
MCV: 82.7 fL (ref 80.0–100.0)
Platelets: 319 K/uL (ref 150–400)
RBC: 3.59 MIL/uL — ABNORMAL LOW (ref 3.87–5.11)
RDW: 17.5 % — ABNORMAL HIGH (ref 11.5–15.5)
WBC: 6 K/uL (ref 4.0–10.5)
nRBC: 0 % (ref 0.0–0.2)

## 2024-08-14 LAB — ACTH: C206 ACTH: 15.4 pg/mL (ref 7.2–63.3)

## 2024-08-14 MED ORDER — CLONAZEPAM 0.5 MG PO TABS
0.5000 mg | ORAL_TABLET | Freq: Three times a day (TID) | ORAL | Status: DC
  Administered 2024-08-14 – 2024-08-17 (×9): 0.5 mg via ORAL
  Filled 2024-08-14 (×9): qty 1

## 2024-08-14 MED ORDER — MAGNESIUM SULFATE 2 GM/50ML IV SOLN
2.0000 g | Freq: Once | INTRAVENOUS | Status: AC
  Administered 2024-08-14: 2 g via INTRAVENOUS
  Filled 2024-08-14: qty 50

## 2024-08-14 MED ORDER — ORAL CARE MOUTH RINSE
15.0000 mL | OROMUCOSAL | Status: DC
  Administered 2024-08-15 – 2024-08-17 (×8): 15 mL via OROMUCOSAL

## 2024-08-14 MED ORDER — ORAL CARE MOUTH RINSE: 15.0000 mL | OROMUCOSAL | Status: DC | PRN

## 2024-08-14 MED ORDER — CARMEX CLASSIC LIP BALM EX OINT
1.0000 | TOPICAL_OINTMENT | CUTANEOUS | Status: DC | PRN
  Filled 2024-08-14: qty 10

## 2024-08-14 NOTE — Progress Notes (Signed)
 " PROGRESS NOTE    LING FLESCH  FMW:[id number] DOB: Oct 31, 1970 DOA: 08/13/2024 PCP: Alba Sharper, MD    Brief Narrative:  54 year old with cerebral palsy, baseline aphasia, ambulatory dysfunction, seizure disorder on multiple seizure medications and Klonopin , recent 2 hospitalizations with altered mental status and hypothermia again brought to the emergency room with altered mental status, fell off the bed and also fell in the hallway hitting her door.  In the emergency room she was found hypothermic, agitated.  Temperature improved with Bair hugger.  Skeletal survey was negative. 12/31, admitted from ER to the ICU due to hypothermia and altered mental status. 1/1, transferred out of ICU.  Subjective: Patient seen and examined.  Poor historian.   she does not speak at baseline.  Looks fairly comfortable.  Afebrile.  Nursing noted patient able to take medications without any difficulties.  She was assisted with meal. Normotensive and normothermic now.  Assessment & Plan:   Acute metabolic encephalopathy in a patient with underlying cerebral palsy Seizure disorder, suspect recurrent seizure Hypothermia, likely environmental  CT head, cervical spine were negative. TSH, B12, a.m. cortisol, normal. Keppra  and phenytoin  level, pending Ammonia, normal Blood cultures negative. Continue to provide supportive care.  Resume all seizure medications including Vimpat , Keppra , Dilantin , Fycompa .  Resume Klonopin . Repeat EKG reviewed, QTc 447.  Holding Seroquel. Does not benefit with new EEG, patient already has baseline abnormal EEG. There was no report of seizure-like activities at the facility.     DVT prophylaxis: enoxaparin  (LOVENOX ) injection 40 mg Start: 08/13/24 1000 SCDs Start: 08/13/24 9365   Code Status: Full code Family Communication: Unable to reach the guardian Disposition Plan: Status is: Inpatient Remains inpatient appropriate because: Severe significant illness      Consultants:  Critical care  Procedures:  None  Antimicrobials:  None     Objective: Vitals:   08/14/24 0900 08/14/24 1100 08/14/24 1140 08/14/24 1205  BP: 110/69 97/64 105/77 111/80  Pulse: (!) 108 (!) 101 (!) 101 (!) 101  Resp: 19 17 13 17   Temp: 99.5 F (37.5 C) 99.3 F (37.4 C) 99.3 F (37.4 C)   TempSrc:   Bladder   SpO2: 97% 98% 96% 98%  Weight:      Height:        Intake/Output Summary (Last 24 hours) at 08/14/2024 1255 Last data filed at 08/14/2024 1237 Gross per 24 hour  Intake 1055.66 ml  Output 1600 ml  Net -544.34 ml   Filed Weights   08/13/24 1632 08/14/24 0500  Weight: 65 kg 65 kg    Examination:  General exam: Appears calm and comfortable  Frail and debilitated.  Chronically sick looking. Respiratory system: Clear to auscultation. Respiratory effort normal.  No added sounds. Cardiovascular system: S1 & S2 heard, RRR.  No pedal edema. Gastrointestinal system: Soft.  Nontender.  Bowel sound present. Central nervous system: Alert and awake.  Slow to respond.  Aphasic. Patient has generalized weakness.  She has obvious stiffness of the left upper extremity as compared to right.    Data Reviewed: I have personally reviewed following labs and imaging studies  CBC: Recent Labs  Lab 08/13/24 0054 08/13/24 0731 08/14/24 0330  WBC 4.1 5.2 6.0  NEUTROABS 2.4  --   --   HGB 10.6* 8.6* 9.2*  HCT 34.0* 27.6* 29.7*  MCV 84.6 84.7 82.7  PLT 313 276 319   Basic Metabolic Panel: Recent Labs  Lab 08/13/24 0054 08/13/24 0731 08/13/24 1804 08/14/24 0330  NA 141  --  143 149*  K 3.0*  --  5.1 3.9  CL 99  --  103 98  CO2 32  --  25 31  GLUCOSE 118*  --  76 88  BUN 6  --  6 6  CREATININE 0.66 0.65 0.84 0.92  CALCIUM  10.0  --  9.7 9.0  MG  --   --   --  1.9   GFR: Estimated Creatinine Clearance: 65.1 mL/min (by C-G formula based on SCr of 0.92 mg/dL). Liver Function Tests: Recent Labs  Lab 08/13/24 0054  AST 29  ALT 20  ALKPHOS 211*   BILITOT 0.3  PROT 8.8*  ALBUMIN 3.9   No results for input(s): LIPASE, AMYLASE in the last 168 hours. Recent Labs  Lab 08/13/24 0453  AMMONIA 34   Coagulation Profile: No results for input(s): INR, PROTIME in the last 168 hours. Cardiac Enzymes: No results for input(s): CKTOTAL, CKMB, CKMBINDEX, TROPONINI in the last 168 hours. BNP (last 3 results) No results for input(s): PROBNP in the last 8760 hours. HbA1C: No results for input(s): HGBA1C in the last 72 hours. CBG: Recent Labs  Lab 08/13/24 0035  GLUCAP 131*   Lipid Profile: No results for input(s): CHOL, HDL, LDLCALC, TRIG, CHOLHDL, LDLDIRECT in the last 72 hours. Thyroid  Function Tests: Recent Labs    08/13/24 0731  TSH 3.350  FREET4 0.85   Anemia Panel: No results for input(s): VITAMINB12, FOLATE, FERRITIN, TIBC, IRON, RETICCTPCT in the last 72 hours. Sepsis Labs: Recent Labs  Lab 08/13/24 0100  LATICACIDVEN 0.8    Recent Results (from the past 240 hours)  Blood Culture (routine x 2)     Status: None (Preliminary result)   Collection Time: 08/13/24 12:54 AM   Specimen: BLOOD  Result Value Ref Range Status   Specimen Description   Final    BLOOD SITE NOT SPECIFIED Performed at [facility], [address] [place], [place] [address]    Special Requests   Final    BOTTLES DRAWN AEROBIC AND ANAEROBIC Blood Culture results may not be optimal due to an inadequate volume of blood received in culture bottles Performed at [facility], [address], [place], [place] [address]    Culture   Final    NO GROWTH 1 DAY Performed at [facility], [address], [place], [place] [address]    Report Status PENDING  Incomplete  MRSA Next Gen by PCR, Nasal     Status: Abnormal   Collection Time: 08/13/24  3:31 PM   Specimen: Nasal Mucosa; Nasal Swab  Result Value Ref Range Status   MRSA by PCR Next Gen DETECTED (A) NOT  DETECTED Final    Comment: (NOTE) The GeneXpert MRSA Assay (FDA approved for NASAL specimens only), is one component of a comprehensive MRSA colonization surveillance program. It is not intended to diagnose MRSA infection nor to guide or monitor treatment for MRSA infections. Test performance is not FDA approved in patients less than 30 years old. Performed at [facility], [address] [place], [place] [address]   Blood Culture (routine x 2)     Status: None (Preliminary result)   Collection Time: 08/13/24  9:17 PM   Specimen: BLOOD LEFT HAND  Result Value Ref Range Status   Specimen Description   Final    BLOOD LEFT HAND Performed at [facility], [address] [place], [place] [address]    Special Requests   Final  BOTTLES DRAWN AEROBIC AND ANAEROBIC Blood Culture results may not be optimal due to an inadequate volume of blood received in culture bottles Performed at [facility], [address] [place], [place] [address]    Culture   Final    NO GROWTH < 12 HOURS Performed at [facility], [address] [place], [place] [address]    Report Status PENDING  Incomplete         Radiology Studies: CT Cervical Spine Wo Contrast Result Date: 08/13/2024 EXAM: CT CERVICAL SPINE WITHOUT CONTRAST 08/13/2024 01:59:14 AM TECHNIQUE: CT of the cervical spine was performed without the administration of intravenous contrast. Multiplanar reformatted images are provided for review. Automated exposure control, iterative reconstruction, and/or weight based adjustment of the mA/kV was utilized to reduce the radiation dose to as low as reasonably achievable. COMPARISON: None available. CLINICAL HISTORY: Neck trauma, intoxicated or obtunded (Age >= 16y) FINDINGS: BONES AND ALIGNMENT: No acute fracture or traumatic malalignment. DEGENERATIVE CHANGES: Discontinuous anterior osteophytes at C5 and C6. SOFT TISSUES: No prevertebral soft tissue  swelling. IMPRESSION: 1. No evidence of acute traumatic injury. Electronically signed by: Franky Stanford MD 08/13/2024 02:14 AM EST RP Workstation: [id number]   CT Head Wo Contrast Result Date: 08/13/2024 EXAM: CT HEAD WITHOUT CONTRAST 08/13/2024 01:59:14 AM TECHNIQUE: CT of the head was performed without the administration of intravenous contrast. Automated exposure control, iterative reconstruction, and/or weight based adjustment of the mA/kV was utilized to reduce the radiation dose to as low as reasonably achievable. COMPARISON: 07/31/2024 CLINICAL HISTORY: Head trauma, moderate-severe FINDINGS: BRAIN AND VENTRICLES: No acute hemorrhage. No evidence of acute infarct. No hydrocephalus. No extra-axial collection. No mass effect or midline shift. ORBITS: No acute abnormality. SINUSES: Mild mucosal thickening in frontal and ethmoid air cells. SOFT TISSUES AND SKULL: Debris in bilateral external auditory canals. No acute soft tissue abnormality. No skull fracture. IMPRESSION: 1. No acute intracranial abnormality. Electronically signed by: Franky Stanford MD 08/13/2024 02:06 AM EST RP Workstation: [id number]   DG Chest Port 1 View Result Date: 08/13/2024 EXAM: 1 VIEW(S) XRAY OF THE CHEST 08/13/2024 01:12:00 AM COMPARISON: 07/31/2024 CLINICAL HISTORY: Questionable sepsis - evaluate for abnormality FINDINGS: LUNGS AND PLEURA: Low lung volumes. Elevated left hemidiaphragm. Left lower lung zone linear atelectasis. No pleural effusion. No pneumothorax. HEART AND MEDIASTINUM: No acute abnormality of the cardiac and mediastinal silhouettes. BONES AND SOFT TISSUES: No acute osseous abnormality. IMPRESSION: 1. No acute findings. Electronically signed by: Greig Pique MD 08/13/2024 01:39 AM EST RP Workstation: [id number]        Scheduled Meds:  Chlorhexidine  Gluconate Cloth  6 each Topical Daily   clonazePAM   0.5 mg Oral TID   enoxaparin  (LOVENOX ) injection  40 mg Subcutaneous Q24H   lacosamide   200 mg Oral BID    levETIRAcetam   1,000 mg Oral Daily   levETIRAcetam   1,500 mg Oral QHS   mirtazapine  22.5 mg Oral QHS   pantoprazole   40 mg Oral QAC breakfast   perampanel   4 mg Oral Daily   phenytoin   75 mg Oral Daily   phenytoin   100 mg Oral QHS   rosuvastatin   20 mg Oral QHS   Continuous Infusions:   LOS: 1 day       Alastair Hennes, MD [facility]   "

## 2024-08-14 NOTE — Evaluation (Signed)
 Physical Therapy Evaluation Patient Details Name: Patricia White MRN: [id number] DOB: [birth year] Today's Date: 08/14/2024  History of Present Illness  Patient is a 54 y/o female admitted 08/13/24 from [facility] after 2 falls and concern for seizure with AMS.  She was found to be hypothermic and had bair hugger placed.  She had head imaging negative for acute changes.  PMH positive for Cerebral palsy with L spastic paresis, seizure disorder, GERD, HLD.  Clinical Impression  Patient presents with decreased mobility due to lethargy, decreased balance, decreased strength with recent AMS question seizure.  She was during previous stays able to transfer with mod A of 1, currently max A of 2 and needing A for sitting balance.  Feel she may benefit from skilled PT in the acute setting and from post-acute inpatient skilled PT at d/c (<3 hours.day).         If plan is discharge home, recommend the following: Two people to help with walking and/or transfers;Two people to help with bathing/dressing/bathroom   Can travel by private vehicle   No    Equipment Recommendations None recommended by PT  Recommendations for Other Services       Functional Status Assessment Patient has had a recent decline in their functional status and demonstrates the ability to make significant improvements in function in a reasonable and predictable amount of time.     Precautions / Restrictions Precautions Precautions: Fall Precaution/Restrictions Comments: seizure      Mobility  Bed Mobility Overal bed mobility: Needs Assistance Bed Mobility: Supine to Sit     Supine to sit: +2 for safety/equipment, Max assist     General bed mobility comments: assist for legs off EOB and HOB up so initially pt not helping to lift trunk with encouragement and RN pt assisted some with R UE to pull her trunk up with HHA    Transfers Overall transfer level: Needs assistance   Transfers: Bed to  chair/wheelchair/BSC       Squat pivot transfers: Max assist, +2 physical assistance     General transfer comment: to recliner then to [facility] then back to recliner    Ambulation/Gait               General Gait Details: unable  Stairs            Wheelchair Mobility     Tilt Bed    Modified Rankin (Stroke Patients Only)       Balance Overall balance assessment: Needs assistance   Sitting balance-Leahy Scale: Poor Sitting balance - Comments: mod to min A for balance on EOB     Standing balance-Leahy Scale: Zero Standing balance comment: unable to stand fully with +2 for standing during perineal hygiene                             Pertinent Vitals/Pain Pain Assessment Pain Assessment: Faces Faces Pain Scale: No hurt    Home Living Family/patient expects to be discharged to:: Assisted living                   Additional Comments: from [facility]; pt not able to state prior level, noted from previous PT notes with admissions in July and May 2025 pt needing mod A to +2 A for transfers only    Prior Function Prior Level of Function : History of Falls (last six months)  ADLs Comments: likely needing A for all ADL     Extremity/Trunk Assessment   Upper Extremity Assessment Upper Extremity Assessment: LUE deficits/detail LUE Deficits / Details: not actively moving and needs help to place arm during transfers    Lower Extremity Assessment Lower Extremity Assessment: RLE deficits/detail;LLE deficits/detail RLE Deficits / Details: not following commands for testing, supports weight some though knee flexed needs blocking LLE Deficits / Details: weaker than R and needing assist to move off EOB, not following commands for testing    Cervical / Trunk Assessment Cervical / Trunk Assessment: Kyphotic;Other exceptions Cervical / Trunk Exceptions: limited head control sitting EOB  Communication    Communication Communication: Impaired Factors Affecting Communication: Reduced clarity of speech;Difficulty expressing self    Cognition Arousal: Lethargic Behavior During Therapy: Flat affect, Lability   PT - Cognitive impairments: No family/caregiver present to determine baseline                       PT - Cognition Comments: initially refusing and pushing PT away then  flat and with limited participation (rag doll) though able to follow some commands to straighten up at times though short lived         Cueing       General Comments General comments (skin integrity, edema, etc.): VSS though pt with occasional twitch while RN in the room though still responding    Exercises     Assessment/Plan    PT Assessment Patient needs continued PT services  PT Problem List Decreased balance;Decreased cognition;Decreased activity tolerance;Decreased safety awareness;Decreased mobility       PT Treatment Interventions Therapeutic activities;Functional mobility training;Balance training;Neuromuscular re-education;Wheelchair mobility training;Patient/family education    PT Goals (Current goals can be found in the Care Plan section)  Acute Rehab PT Goals Patient Stated Goal: unable to state PT Goal Formulation: Patient unable to participate in goal setting Time For Goal Achievement: 08/28/24 Potential to Achieve Goals: Fair    Frequency Min 1X/week     Co-evaluation               AM-PAC PT 6 Clicks Mobility  Outcome Measure Help needed turning from your back to your side while in a flat bed without using bedrails?: Total Help needed moving from lying on your back to sitting on the side of a flat bed without using bedrails?: Total Help needed moving to and from a bed to a chair (including a wheelchair)?: Total Help needed standing up from a chair using your arms (e.g., wheelchair or bedside chair)?: Total Help needed to walk in hospital room?: Total Help needed  climbing 3-5 steps with a railing? : Total 6 Click Score: 6    End of Session Equipment Utilized During Treatment: Gait belt Activity Tolerance: Patient limited by lethargy Patient left: in chair;with nursing/sitter in room;with chair alarm set Nurse Communication: Need for lift equipment PT Visit Diagnosis: Muscle weakness (generalized) (M62.81);Other abnormalities of gait and mobility (R26.89);Other symptoms and signs involving the nervous system (R29.898)    Time: 1125-1202 PT Time Calculation (min) (ACUTE ONLY): 37 min   Charges:   PT Evaluation $PT Eval Moderate Complexity: 1 Mod PT Treatments $Therapeutic Activity: 8-22 mins PT General Charges $$ ACUTE PT VISIT: 1 Visit         Micheline Portal, PT Acute Rehabilitation Services Office:[phone] 08/14/2024   Montie Portal 08/14/2024, 12:14 PM

## 2024-08-15 ENCOUNTER — Encounter (HOSPITAL_COMMUNITY): Payer: Self-pay | Admitting: Critical Care Medicine

## 2024-08-15 DIAGNOSIS — G9341 Metabolic encephalopathy: Secondary | ICD-10-CM | POA: Diagnosis not present

## 2024-08-15 DIAGNOSIS — T68XXXA Hypothermia, initial encounter: Secondary | ICD-10-CM | POA: Diagnosis not present

## 2024-08-15 LAB — BASIC METABOLIC PANEL WITH GFR
Anion gap: 11 (ref 5–15)
BUN: 6 mg/dL (ref 6–20)
CO2: 28 mmol/L (ref 22–32)
Calcium: 9.6 mg/dL (ref 8.9–10.3)
Chloride: 101 mmol/L (ref 98–111)
Creatinine, Ser: 0.91 mg/dL (ref 0.44–1.00)
GFR, Estimated: >60 mL/min
Glucose, Bld: 87 mg/dL (ref 70–99)
Potassium: 3.7 mmol/L (ref 3.5–5.1)
Sodium: 140 mmol/L (ref 135–145)

## 2024-08-15 LAB — CBC WITH DIFFERENTIAL/PLATELET
Abs Immature Granulocytes: 0.02 K/uL (ref 0.00–0.07)
Basophils Absolute: 0 K/uL (ref 0.0–0.1)
Basophils Relative: 0 %
Eosinophils Absolute: 0.3 K/uL (ref 0.0–0.5)
Eosinophils Relative: 5 %
HCT: 29.9 % — ABNORMAL LOW (ref 36.0–46.0)
Hemoglobin: 9.3 g/dL — ABNORMAL LOW (ref 12.0–15.0)
Immature Granulocytes: 0 %
Lymphocytes Relative: 21 %
Lymphs Abs: 1.4 K/uL (ref 0.7–4.0)
MCH: 26.2 pg (ref 26.0–34.0)
MCHC: 31.1 g/dL (ref 30.0–36.0)
MCV: 84.2 fL (ref 80.0–100.0)
Monocytes Absolute: 0.7 K/uL (ref 0.1–1.0)
Monocytes Relative: 10 %
Neutro Abs: 4.4 K/uL (ref 1.7–7.7)
Neutrophils Relative %: 64 %
Platelets: 359 K/uL (ref 150–400)
RBC: 3.55 MIL/uL — ABNORMAL LOW (ref 3.87–5.11)
RDW: 18.1 % — ABNORMAL HIGH (ref 11.5–15.5)
WBC: 6.8 K/uL (ref 4.0–10.5)
nRBC: 0 % (ref 0.0–0.2)

## 2024-08-15 LAB — MAGNESIUM: Magnesium: 2.3 mg/dL (ref 1.7–2.4)

## 2024-08-15 LAB — LEVETIRACETAM LEVEL: Levetiracetam Lvl: 20.2 ug/mL (ref 10.0–40.0)

## 2024-08-15 NOTE — Plan of Care (Signed)
" °  Problem: Clinical Measurements: Goal: Ability to maintain clinical measurements within normal limits will improve Outcome: Progressing Goal: Will remain free from infection Outcome: Progressing Goal: Diagnostic test results will improve Outcome: Progressing Goal: Respiratory complications will improve Outcome: Progressing   Problem: Elimination: Goal: Will not experience complications related to urinary retention Outcome: Progressing   Problem: Pain Managment: Goal: General experience of comfort will improve and/or be controlled Outcome: Progressing   Problem: Skin Integrity: Goal: Risk for impaired skin integrity will decrease Outcome: Progressing   "

## 2024-08-15 NOTE — TOC Initial Note (Signed)
 Transition of Care [facility]) - Initial/Assessment Note    Patient Details  Name: Patricia White MRN: [id number] Date of Birth: 1971-07-24  Transition of Care [facility]) CM/SW Contact:    Jon ONEIDA Anon, RN Phone Number: 08/15/2024, 10:06 AM  Clinical Narrative:                 Pt is from [facility]. Pt came into the ED with complaints of fall at the nursing facility. RNCM spoke with pt Legal Guardian [name] Irving and she states when pt is medically ready for discharge pt will return to [facility] for long term care. Admissions Coordinator aware and states pt can return once medically ready. ICM will continue to follow for DC planning needs.     Expected Discharge Plan: Long Term Nursing Home Barriers to Discharge: Continued Medical Work up   Patient Goals and CMS Choice Patient states their goals for this hospitalization and ongoing recovery are:: Return to [facility] per Legal Guardian Heather CMS Medicare.gov Compare Post Acute Care list provided to:: Legal Guardian Choice offered to / list presented to : [facility] POA / Guardian Kiel ownership interest in [facility].provided to:: [facility] POA / Guardian    Expected Discharge Plan and Services In-house Referral: NA Discharge Planning Services: CM Consult Post Acute Care Choice: Nursing Home Living arrangements for the past 2 months: Skilled Nursing Facility                 DME Arranged: N/A DME Agency: NA       HH Arranged: NA HH Agency: NA        Prior Living Arrangements/Services Living arrangements for the past 2 months: Skilled Nursing Facility Lives with:: Facility Resident Patient language and need for interpreter reviewed:: Yes        Need for Family Participation in Patient Care: Yes (Comment) Care giver support system in place?: Yes (comment) Current home services: DME Criminal Activity/Legal Involvement Pertinent to Current Situation/Hospitalization: No - Comment as needed  Activities of  Daily Living   ADL Screening (condition at time of admission) Independently performs ADLs?: No Does the patient have a NEW difficulty with bathing/dressing/toileting/self-feeding that is expected to last >3 days?: No Does the patient have a NEW difficulty with getting in/out of bed, walking, or climbing stairs that is expected to last >3 days?: No Does the patient have a NEW difficulty with communication that is expected to last >3 days?: No Is the patient deaf or have difficulty hearing?: No Does the patient have difficulty seeing, even when wearing glasses/contacts?: No Does the patient have difficulty concentrating, remembering, or making decisions?: Yes  Permission Sought/Granted Permission sought to share information with : Facility Medical Sales Representative, Guardian    Share Information with NAME: Irving [name]  Relationship: Legal Guardian Mobile [phone]  Permission granted to share info w AGENCY: [facility]        Emotional Assessment   Attitude/Demeanor/Rapport: Unable to Assess Affect (typically observed): Unable to Assess   Alcohol / Substance Use: Not Applicable Psych Involvement: No (comment)  Admission diagnosis:  Delirium [R41.0] Hypothermia [T68.XXXA] Hypothermia, initial encounter [T68.XXXA] Patient Active Problem List   Diagnosis Date Noted   Hypothermia 08/13/2024   Acute urinary retention 08/02/2024   Acute encephalopathy 08/01/2024   Acute metabolic encephalopathy 07/31/2024   Prolonged QT interval 07/31/2024   Elevated phenytoin  level 05/05/2024   AMS (altered mental status) 05/02/2024   Sepsis (HCC) 02/06/2024   SIRS (systemic inflammatory response syndrome) (HCC) 02/05/2024  Blood per rectum 01/02/2024   Ulcer of upper extremity 12/31/2023   Facial swelling 12/30/2023   Open wound of left upper arm 12/29/2023   Sinus tachycardia 12/29/2023   Pressure injury of skin of left elbow 12/29/2023   Intellectual disability 12/28/2023   Cerebral  palsy (HCC) 12/28/2023   Infestation by bed bug 12/27/2023   Seizure-like activity (HCC) 12/27/2023   Left arm pain 11/24/2023   Hypotension 11/23/2023   Left arm swelling 11/23/2023   Status epilepticus (HCC) 11/19/2023   Pancytopenia (HCC) 11/11/2023   Hypomagnesemia 11/09/2023   Dehydration 11/08/2023   Gastritis and gastroduodenitis 11/08/2023   Gastric polyp 11/08/2023   Nausea without vomiting 11/08/2023   Protein-calorie malnutrition, severe 11/08/2023   Decreased appetite 11/05/2023   Lower urinary tract infectious disease 11/01/2023   Chronic health problem 11/01/2023   Oral thrush 11/01/2023   FTT (failure to thrive) in adult 11/01/2023   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   Hot flashes due to menopause 06/30/2021   Hyperlipidemia associated with type 2 diabetes mellitus (HCC) 11/24/2020   Diabetes mellitus without complication (HCC) 11/23/2020   Angioedema 12/10/2019   Lesion of mandible 12/10/2019   Seasonal allergies 11/20/2019   Essential hypertension 10/23/2019   Seizure disorder (HCC) 02/23/2016   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 [place] [address] Phone: [phone] Fax: [phone]  [facility] - [place], [place] - [address] [place] [address] Phone: [phone] Fax: [phone]     Social Drivers of Health (SDOH) Social History: SDOH Screenings   Food Insecurity: Patient Unable To Answer (08/13/2024)  Housing: Unknown (08/13/2024)  Transportation Needs: Patient Unable To Answer (08/13/2024)  Utilities: Patient Unable To Answer (08/13/2024)  Alcohol Screen: Low Risk (03/24/2023)  Depression (PHQ2-9): Low Risk  (08/09/2023)  Financial Resource Strain: Low Risk (03/24/2023)  Physical Activity: Insufficiently Active (03/24/2023)  Social Connections: Moderately Isolated (11/19/2023)  Stress: No Stress Concern Present (03/24/2023)  Tobacco Use: Low Risk (08/15/2024)  Health Literacy: Adequate Health Literacy (03/24/2023)   SDOH Interventions:     Readmission Risk Interventions    08/15/2024    9:57 AM 11/16/2023   11:48 AM  Readmission Risk Prevention Plan  Transportation Screening Complete Complete  PCP or Specialist Appt within 5-7 Days  Complete  Home Care Screening  Complete  Medication Review (RN CM)  Complete  Medication Review (RN Care Manager) Complete   PCP or Specialist appointment within 3-5 days of discharge Complete   HRI or Home Care Consult Complete   SW Recovery Care/Counseling Consult Complete   Palliative Care Screening Not Applicable   Skilled Nursing Facility Complete

## 2024-08-15 NOTE — Progress Notes (Signed)
 " PROGRESS NOTE    Patricia White  FMW:[id number] DOB: 05-Feb-1971 DOA: 08/13/2024 PCP: Alba Sharper, MD    Brief Narrative:  54 year old with cerebral palsy, baseline aphasia, ambulatory dysfunction, seizure disorder on multiple seizure medications and Klonopin , recent 2 hospitalizations with altered mental status and hypothermia again brought to the emergency room with altered mental status, fell off the bed and also fell in the hallway hitting her door.  In the emergency room she was found hypothermic, agitated.  Temperature improved with Bair hugger.  Skeletal survey was negative. 12/31, admitted from ER to the ICU due to hypothermia and altered mental status. 1/1, transferred out of ICU.  Subjective: Patient seen and examined.  No overnight events.  Looked comfortable.  She was trying to say something but could not make any intelligible words.  Nursing reported no overnight events.  Safely taking medications.  Normotensive and normothermic now.  Assessment & Plan:   Acute metabolic encephalopathy in a patient with underlying cerebral palsy Seizure disorder, suspect recurrent seizure Hypothermia, likely environmental  CT head, cervical spine were negative. TSH, B12, a.m. cortisol, normal. Keppra  level subtherapeutic.  20.2.   phenytoin  level, pending Ammonia, normal Blood cultures negative. Continue to provide supportive care.  Resume all seizure medications including Vimpat , Keppra , Dilantin , Fycompa  and Klonopin . Repeat EKG reviewed, QTc 447.   Does not benefit with new EEG, patient already has baseline abnormal EEG. There was no report of seizure-like activities at the facility.     DVT prophylaxis: enoxaparin  (LOVENOX ) injection 40 mg Start: 08/13/24 1000 SCDs Start: 08/13/24 9365   Code Status: Full code Family Communication: None today Disposition Plan: Status is: Inpatient Remains inpatient appropriate because: Severe significant illness.  Anticipate discharge  back to nursing home tomorrow if remains stable.     Consultants:  Critical care  Procedures:  None  Antimicrobials:  None     Objective: Vitals:   08/15/24 0800 08/15/24 0900 08/15/24 0910 08/15/24 1000  BP: (!) 87/63 91/65  97/61  Pulse: (!) 110 (!) 117  (!) 113  Resp: 17 17  20   Temp:   99.4 F (37.4 C)   TempSrc:   Oral   SpO2: 94% 95%  96%  Weight:      Height:        Intake/Output Summary (Last 24 hours) at 08/15/2024 1223 Last data filed at 08/15/2024 0500 Gross per 24 hour  Intake 200 ml  Output 200 ml  Net 0 ml   Filed Weights   08/14/24 0500 08/14/24 1426 08/15/24 0500  Weight: 65 kg 66.4 kg 66.8 kg    Examination:  General exam: Appears calm and comfortable.  Frail.  She is aphasic. Responds appropriately. Respiratory system: Clear to auscultation. Respiratory effort normal.  No added sounds. Cardiovascular system: S1 & S2 heard, RRR.  No pedal edema. Gastrointestinal system: Soft.  Nontender.  Bowel sound present. Central nervous system: Alert and awake.  Slow to respond.  Aphasic. Patient has generalized weakness.  She has obvious stiffness of the left upper extremity as compared to right. She does follow simple commands and moves extremities.    Data Reviewed: I have personally reviewed following labs and imaging studies  CBC: Recent Labs  Lab 08/13/24 0054 08/13/24 0731 08/14/24 0330 08/15/24 0238  WBC 4.1 5.2 6.0 6.8  NEUTROABS 2.4  --   --  4.4  HGB 10.6* 8.6* 9.2* 9.3*  HCT 34.0* 27.6* 29.7* 29.9*  MCV 84.6 84.7 82.7 84.2  PLT 313 276 319  359   Basic Metabolic Panel: Recent Labs  Lab 08/13/24 0054 08/13/24 0731 08/13/24 1804 08/14/24 0330 08/15/24 0238  NA 141  --  143 149* 140  K 3.0*  --  5.1 3.9 3.7  CL 99  --  103 98 101  CO2 32  --  25 31 28   GLUCOSE 118*  --  76 88 87  BUN 6  --  6 6 6   CREATININE 0.66 0.65 0.84 0.92 0.91  CALCIUM  10.0  --  9.7 9.0 9.6  MG  --   --   --  1.9 2.3   GFR: Estimated Creatinine  Clearance: 65.8 mL/min (by C-G formula based on SCr of 0.91 mg/dL). Liver Function Tests: Recent Labs  Lab 08/13/24 0054  AST 29  ALT 20  ALKPHOS 211*  BILITOT 0.3  PROT 8.8*  ALBUMIN 3.9   No results for input(s): LIPASE, AMYLASE in the last 168 hours. Recent Labs  Lab 08/13/24 0453  AMMONIA 34   Coagulation Profile: No results for input(s): INR, PROTIME in the last 168 hours. Cardiac Enzymes: No results for input(s): CKTOTAL, CKMB, CKMBINDEX, TROPONINI in the last 168 hours. BNP (last 3 results) No results for input(s): PROBNP in the last 8760 hours. HbA1C: No results for input(s): HGBA1C in the last 72 hours. CBG: Recent Labs  Lab 08/13/24 0035  GLUCAP 131*   Lipid Profile: No results for input(s): CHOL, HDL, LDLCALC, TRIG, CHOLHDL, LDLDIRECT in the last 72 hours. Thyroid  Function Tests: Recent Labs    08/13/24 0731  TSH 3.350  FREET4 0.85   Anemia Panel: No results for input(s): VITAMINB12, FOLATE, FERRITIN, TIBC, IRON, RETICCTPCT in the last 72 hours. Sepsis Labs: Recent Labs  Lab 08/13/24 0100  LATICACIDVEN 0.8    Recent Results (from the past 240 hours)  Blood Culture (routine x 2)     Status: None (Preliminary result)   Collection Time: 08/13/24 12:54 AM   Specimen: BLOOD  Result Value Ref Range Status   Specimen Description   Final    BLOOD SITE NOT SPECIFIED Performed at [facility], [address] [place], [place] [address]    Special Requests   Final    BOTTLES DRAWN AEROBIC AND ANAEROBIC Blood Culture results may not be optimal due to an inadequate volume of blood received in culture bottles Performed at [facility], [address] [place], [place] [address]    Culture   Final    NO GROWTH 2 DAYS Performed at [facility], [address] [place], [place] [address]    Report Status PENDING  Incomplete  MRSA Next Gen by PCR, Nasal     Status:  Abnormal   Collection Time: 08/13/24  3:31 PM   Specimen: Nasal Mucosa; Nasal Swab  Result Value Ref Range Status   MRSA by PCR Next Gen DETECTED (A) NOT DETECTED Final    Comment: (NOTE) The GeneXpert MRSA Assay (FDA approved for NASAL specimens only), is one component of a comprehensive MRSA colonization surveillance program. It is not intended to diagnose MRSA infection nor to guide or monitor treatment for MRSA infections. Test performance is not FDA approved in patients less than 16 years old. Performed at [facility], [address], [place], [place] [address]   Blood Culture (routine x 2)     Status: None (Preliminary result)   Collection Time: 08/13/24  9:17 PM   Specimen: BLOOD LEFT HAND  Result Value Ref Range Status  Specimen Description   Final    BLOOD LEFT HAND Performed at [facility], [address] [place], [place] [address]    Special Requests   Final    BOTTLES DRAWN AEROBIC AND ANAEROBIC Blood Culture results may not be optimal due to an inadequate volume of blood received in culture bottles Performed at [facility], [address], [place], [place] [address]    Culture   Final    NO GROWTH 1 DAY Performed at [facility], [address], [place], [place] [address]    Report Status PENDING  Incomplete         Radiology Studies: No results found.       Scheduled Meds:  Chlorhexidine  Gluconate Cloth  6 each Topical Daily   clonazePAM   0.5 mg Oral TID   enoxaparin  (LOVENOX ) injection  40 mg Subcutaneous Q24H   lacosamide   200 mg Oral BID   levETIRAcetam   1,000 mg Oral Daily   levETIRAcetam   1,500 mg Oral QHS   mirtazapine  22.5 mg Oral QHS   mouth rinse  15 mL Mouth Rinse 4 times per day   pantoprazole   40 mg Oral QAC breakfast   perampanel   4 mg Oral Daily   phenytoin   75 mg Oral Daily   phenytoin   100 mg Oral QHS   rosuvastatin   20 mg Oral QHS   Continuous Infusions:   LOS: 2 days        Renato Applebaum, MD [facility]   "

## 2024-08-15 NOTE — Evaluation (Signed)
 Occupational Therapy Evaluation Patient Details Name: Patricia White MRN: [id number] DOB: 1971/04/01 Today's Date: 08/15/2024   History of Present Illness   Patient is a 54 yr old female admitted 08/13/24 from [facility] after 2 falls and concern for seizure with AMS.  She was found to be hypothermic and had bair hugger placed.  She had head imaging negative for acute changes.  PMH positive for Cerebral palsy with L spastic paresis, seizure disorder, GERD, HLD.     Clinical Impressions The pt is currently presenting with the below listed deficits (see OT problem list). As such, her ADL performance is compromised and she is requiring increased assist for performing ADLs. OT is unsure of her prior level of functioning, however her medical chart indicates she is a long-term care resident at [facility]. Pt had difficulty following commands and maintaining sustained attention, in order to perform self-care tasks this date. Based on clinical judgement, she requires total assist for lower body dressing, max assist for upper body dressing, total assist for toileting, and mod-max assist for self-feeding, all at bed level. She required max-total assist for supine to sit and sit to supine. OT will follow her for services in the acute care setting, to maximize her ADL performance and to decrease the risk for progressive weakness. OT recommends she return to her nursing care facility once discharged from the hospital.      If plan is discharge home, recommend the following:   Two people to help with walking and/or transfers;A lot of help with bathing/dressing/bathroom;Direct supervision/assist for medications management;Supervision due to cognitive status;Direct supervision/assist for financial management;Assistance with feeding     Functional Status Assessment   Patient has had a recent decline in their functional status and demonstrates the ability to make significant improvements in  function in a reasonable and predictable amount of time.     Equipment Recommendations   Other (comment) (defer to next level of care)     Recommendations for Other Services         Precautions/Restrictions   Precautions Precautions: Fall Precaution/Restrictions Comments: seizure Restrictions Weight Bearing Restrictions Per Provider Order: No     Mobility Bed Mobility   Bed Mobility: Supine to Sit, Sit to Supine     Supine to sit: Total assist Sit to supine: Max assist   General bed mobility comments: Pt was assisted into sitting edge of bed, where she presented with poor sitting balance/trunk control. She sat edge of bed for a short duration before she proceeded to suddenly return to bed.        Balance     Sitting balance-Leahy Scale: Poor         ADL either performed or assessed with clinical judgement   ADL Overall ADL's : Needs assistance/impaired        General ADL Comments: Pt had difficulty following commands and maintaining sustained attention, in order to perform self-care tasks. Based on clinical judgement, she requires total assist for lower body dressing, max assist for upper body dressing, total assist for toileting, and mod-max assist for self-feeding, all at bed level.      Pertinent Vitals/Pain Pain Assessment Pain Assessment: Faces Pain Score: 0-No pain     Extremity/Trunk Assessment Upper Extremity Assessment Upper Extremity Assessment: Difficult to assess due to impaired cognition;LUE deficits/detail LUE Deficits / Details: Pt did not consistently respond to prompts, in order to participate in formal BUE and BLE ROM and strength testing. SHe did appear to have chronic LUE  and LLE weakness & impaired coordination. No functional grip noted with L hand. Concern for spasticity/increased tone. She required PROM or LUE and LLE   Lower Extremity Assessment Lower Extremity Assessment: Difficult to assess due to impaired  cognition;Generalized weakness RLE Deficits / Details: She required increased assist to advance BLE off and back onto the bed. LLE Deficits / Details: She required increased assist to advance BLE off and back onto the bed.  LLE appears weaker than RLE.       Communication Communication Communication: Impaired Factors Affecting Communication: Reduced clarity of speech;Difficulty expressing self   Cognition Arousal: Alert Behavior During Therapy: Lability, Flat affect Cognition: History of cognitive impairments, Difficult to assess             OT - Cognition Comments: Pt able to not to indicate yes when asked if she was in the hospital. She did not respond when she was asked the date.                 Following commands: Impaired Following commands impaired: Follows one step commands inconsistently     Cueing  General Comments   Cueing Techniques: Verbal cues;Gestural cues;Tactile cues              Home Living Family/patient expects to be discharged to:: Skilled nursing facility            Additional Comments: She resides at [facility] per her medical chart      Prior Functioning/Environment Prior Level of Function : History of Falls (last six months);Patient poor historian/Family not available             Mobility Comments: Pt was unable to state what her baseline mobility level was. She is presumed to need assist in this regard. ADLs Comments: Pt presumed to need assistance for all ADLs at baseline.    OT Problem List: Decreased strength;Decreased range of motion;Decreased activity tolerance;Impaired balance (sitting and/or standing);Decreased coordination;Decreased cognition;Decreased knowledge of use of DME or AE;Impaired tone;Impaired UE functional use   OT Treatment/Interventions: Self-care/ADL training;Therapeutic exercise;Energy conservation;Neuromuscular education;DME and/or AE instruction;Patient/family education;Balance  training      OT Goals(Current goals can be found in the care plan section)   Acute Rehab OT Goals OT Goal Formulation: Patient unable to participate in goal setting Time For Goal Achievement: 08/29/24 Potential to Achieve Goals: Fair ADL Goals Pt Will Perform Eating: with min assist;bed level;with adaptive utensils Pt Will Perform Grooming: with min assist;bed level;with adaptive equipment Pt Will Perform Upper Body Bathing: with min assist;bed level;with adaptive equipment Pt Will Perform Upper Body Dressing: with min assist;bed level Additional ADL Goal #1: The pt will sit EOB for 10 minutes, requiring no more than min assist, in prep for improved ADL participation.   OT Frequency:  Min 2X/week       AM-PAC OT 6 Clicks Daily Activity     Outcome Measure Help from another person eating meals?: A Lot Help from another person taking care of personal grooming?: A Lot Help from another person toileting, which includes using toliet, bedpan, or urinal?: Total Help from another person bathing (including washing, rinsing, drying)?: Total Help from another person to put on and taking off regular upper body clothing?: A Lot Help from another person to put on and taking off regular lower body clothing?: Total 6 Click Score: 9   End of Session Equipment Utilized During Treatment: Other (comment) (N/A) Nurse Communication: Mobility status  Activity Tolerance: Other (comment) (Fair tolerance) Patient  left: in bed;with call bell/phone within reach;with bed alarm set  OT Visit Diagnosis: Other abnormalities of gait and mobility (R26.89);Muscle weakness (generalized) (M62.81);Feeding difficulties (R63.3);Other symptoms and signs involving cognitive function;Cognitive communication deficit (R41.841);Hemiplegia and hemiparesis                Time: 8566-8551 OT Time Calculation (min): 15 min Charges:  OT General Charges $OT Visit: 1 Visit OT Evaluation $OT Eval Moderate Complexity: 1  Mod    Delanna JINNY Lesches, OTR/L 08/15/2024, 4:17 PM

## 2024-08-16 DIAGNOSIS — T68XXXA Hypothermia, initial encounter: Secondary | ICD-10-CM | POA: Diagnosis not present

## 2024-08-16 DIAGNOSIS — R400 Somnolence: Secondary | ICD-10-CM

## 2024-08-16 MED ORDER — QUETIAPINE FUMARATE 25 MG PO TABS
12.5000 mg | ORAL_TABLET | Freq: Every day | ORAL | Status: DC
  Administered 2024-08-16: 12.5 mg via ORAL
  Filled 2024-08-16: qty 1

## 2024-08-16 NOTE — Plan of Care (Signed)
 No acute changes overnight   Problem: Health Behavior/Discharge Planning: Goal: Ability to manage health-related needs will improve Outcome: Progressing   Problem: Clinical Measurements: Goal: Ability to maintain clinical measurements within normal limits will improve Outcome: Progressing   Problem: Clinical Measurements: Goal: Diagnostic test results will improve Outcome: Progressing

## 2024-08-16 NOTE — Progress Notes (Signed)
 " PROGRESS NOTE    Patricia White  FMW:[id number] DOB: 01-10-1971 DOA: 08/13/2024 PCP: Alba Sharper, MD    Brief Narrative:  54 year old with cerebral palsy, baseline aphasia, ambulatory dysfunction, seizure disorder on multiple seizure medications and Klonopin , recent 2 hospitalizations with altered mental status and hypothermia again brought to the emergency room with altered mental status, fell off the bed and also fell in the hallway hitting her door.  In the emergency room she was found hypothermic, agitated.  Temperature improved with Bair hugger.  Skeletal survey was negative. 12/31, admitted from ER to the ICU due to hypothermia and altered mental status. 1/1, transferred out of ICU.  Subjective: Patient seen and examined.  Difficult to understand however she looks comfortable.  Nursing reported that she is at assisted feeding, however reaches for her food and eats.  She was leaking on her chocolate pudding.  No other overnight events. Called all the contact persons on the list to get more information about her baseline.  None available. This is her third admission from the nursing home with altered mentation, hypothermia with no obvious cause. Still has Foley catheter.  Assessment & Plan:   Acute metabolic encephalopathy in a patient with underlying cerebral palsy Seizure disorder, suspect recurrent seizure Hypothermia, likely environmental  CT head, cervical spine were negative. TSH, B12, a.m. cortisol, normal. Keppra  level subtherapeutic.  20.2.   phenytoin  level, pending Ammonia, normal Blood cultures negative. Continue to provide supportive care.  Resume all seizure medications including Vimpat , Keppra , Dilantin , Fycompa  and Klonopin .  Will resume Seroquel  today. Repeat EKG reviewed, QTc 447.   Does not benefit with new EEG, patient already has baseline abnormal EEG. There was no report of seizure-like activities at the facility. Patient likely back to her baseline  today.  Acute urinary retention: Due to altered mentation.  Discontinue Foley catheter, voiding trial today.  Recurrent event: This is her third admission with similar issues without any obvious cause.  May need to ensure proper medication administration at the nursing home.     DVT prophylaxis: enoxaparin  (LOVENOX ) injection 40 mg Start: 08/13/24 1000 SCDs Start: 08/13/24 9365   Code Status: Full code Family Communication: Unable. Disposition Plan: Status is: Inpatient Remains inpatient appropriate because: Severe significant illness.  Anticipate discharge back to nursing home next 24 to 48 hours if she remains stable.      Consultants:  Critical care  Procedures:  None  Antimicrobials:  None     Objective: Vitals:   08/15/24 1702 08/15/24 2143 08/16/24 0120 08/16/24 0522  BP: (!) 129/109 106/72 131/83 111/77  Pulse: (!) 104 91 92 84  Resp: 16 (!) 22 15 16   Temp: 98.4 F (36.9 C) 99.2 F (37.3 C) 97.6 F (36.4 C) 97.7 F (36.5 C)  TempSrc: Oral Oral Oral Oral  SpO2: 100% 97% 99% 99%  Weight:      Height:        Intake/Output Summary (Last 24 hours) at 08/16/2024 1106 Last data filed at 08/15/2024 2000 Gross per 24 hour  Intake --  Output 680 ml  Net -680 ml   Filed Weights   08/14/24 0500 08/14/24 1426 08/15/24 0500  Weight: 65 kg 66.4 kg 66.8 kg    Examination:  General exam: Looks fairly comfortable. Respiratory system: Clear to auscultation. Respiratory effort normal.  No added sounds. Cardiovascular system: S1 & S2 heard, RRR.  No pedal edema. Gastrointestinal system: Soft.  Nontender.  Bowel sound present. Central nervous system: Alert and awake.  Slow  to respond.  Aphasic. Patient has generalized weakness.  Moves all extremities.  She has some intentional tremors. Foley catheter with clear urine.    Data Reviewed: I have personally reviewed following labs and imaging studies  CBC: Recent Labs  Lab 08/13/24 0054 08/13/24 0731  08/14/24 0330 08/15/24 0238  WBC 4.1 5.2 6.0 6.8  NEUTROABS 2.4  --   --  4.4  HGB 10.6* 8.6* 9.2* 9.3*  HCT 34.0* 27.6* 29.7* 29.9*  MCV 84.6 84.7 82.7 84.2  PLT 313 276 319 359   Basic Metabolic Panel: Recent Labs  Lab 08/13/24 0054 08/13/24 0731 08/13/24 1804 08/14/24 0330 08/15/24 0238  NA 141  --  143 149* 140  K 3.0*  --  5.1 3.9 3.7  CL 99  --  103 98 101  CO2 32  --  25 31 28   GLUCOSE 118*  --  76 88 87  BUN 6  --  6 6 6   CREATININE 0.66 0.65 0.84 0.92 0.91  CALCIUM  10.0  --  9.7 9.0 9.6  MG  --   --   --  1.9 2.3   GFR: Estimated Creatinine Clearance: 65.8 mL/min (by C-G formula based on SCr of 0.91 mg/dL). Liver Function Tests: Recent Labs  Lab 08/13/24 0054  AST 29  ALT 20  ALKPHOS 211*  BILITOT 0.3  PROT 8.8*  ALBUMIN 3.9   No results for input(s): LIPASE, AMYLASE in the last 168 hours. Recent Labs  Lab 08/13/24 0453  AMMONIA 34   Coagulation Profile: No results for input(s): INR, PROTIME in the last 168 hours. Cardiac Enzymes: No results for input(s): CKTOTAL, CKMB, CKMBINDEX, TROPONINI in the last 168 hours. BNP (last 3 results) No results for input(s): PROBNP in the last 8760 hours. HbA1C: No results for input(s): HGBA1C in the last 72 hours. CBG: Recent Labs  Lab 08/13/24 0035  GLUCAP 131*   Lipid Profile: No results for input(s): CHOL, HDL, LDLCALC, TRIG, CHOLHDL, LDLDIRECT in the last 72 hours. Thyroid  Function Tests: No results for input(s): TSH, T4TOTAL, FREET4, T3FREE, THYROIDAB in the last 72 hours.  Anemia Panel: No results for input(s): VITAMINB12, FOLATE, FERRITIN, TIBC, IRON, RETICCTPCT in the last 72 hours. Sepsis Labs: Recent Labs  Lab 08/13/24 0100  LATICACIDVEN 0.8    Recent Results (from the past 240 hours)  Blood Culture (routine x 2)     Status: None (Preliminary result)   Collection Time: 08/13/24 12:54 AM   Specimen: BLOOD  Result Value Ref Range  Status   Specimen Description   Final    BLOOD SITE NOT SPECIFIED Performed at [facility], [address] [place], [place] [address]    Special Requests   Final    BOTTLES DRAWN AEROBIC AND ANAEROBIC Blood Culture results may not be optimal due to an inadequate volume of blood received in culture bottles Performed at [facility], [address], [place], [place] [address]    Culture   Final    NO GROWTH 3 DAYS Performed at [facility], [address] [place], [place] [address]    Report Status PENDING  Incomplete  MRSA Next Gen by PCR, Nasal     Status: Abnormal   Collection Time: 08/13/24  3:31 PM   Specimen: Nasal Mucosa; Nasal Swab  Result Value Ref Range Status   MRSA by PCR Next Gen DETECTED (A) NOT DETECTED Final    Comment: (NOTE) The GeneXpert MRSA Assay (FDA approved for NASAL  specimens only), is one component of a comprehensive MRSA colonization surveillance program. It is not intended to diagnose MRSA infection nor to guide or monitor treatment for MRSA infections. Test performance is not FDA approved in patients less than 52 years old. Performed at [facility], [address] [place], [place] [address]   Blood Culture (routine x 2)     Status: None (Preliminary result)   Collection Time: 08/13/24  9:17 PM   Specimen: BLOOD LEFT HAND  Result Value Ref Range Status   Specimen Description   Final    BLOOD LEFT HAND Performed at [facility], [address], [place], [place] [address]    Special Requests   Final    BOTTLES DRAWN AEROBIC AND ANAEROBIC Blood Culture results may not be optimal due to an inadequate volume of blood received in culture bottles Performed at [facility], [address], [place], [place] [address]    Culture   Final    NO GROWTH 2 DAYS Performed at [facility], [address] [place], [place] [address]    Report Status PENDING   Incomplete         Radiology Studies: No results found.       Scheduled Meds:  Chlorhexidine  Gluconate Cloth  6 each Topical Daily   clonazePAM   0.5 mg Oral TID   enoxaparin  (LOVENOX ) injection  40 mg Subcutaneous Q24H   lacosamide   200 mg Oral BID   levETIRAcetam   1,000 mg Oral Daily   levETIRAcetam   1,500 mg Oral QHS   mirtazapine   22.5 mg Oral QHS   mouth rinse  15 mL Mouth Rinse 4 times per day   pantoprazole   40 mg Oral QAC breakfast   perampanel   4 mg Oral Daily   phenytoin   75 mg Oral Daily   phenytoin   100 mg Oral QHS   rosuvastatin   20 mg Oral QHS   Continuous Infusions:   LOS: 3 days       Renato Applebaum, MD [facility]   "

## 2024-08-17 DIAGNOSIS — T68XXXA Hypothermia, initial encounter: Secondary | ICD-10-CM | POA: Diagnosis not present

## 2024-08-17 DIAGNOSIS — R400 Somnolence: Secondary | ICD-10-CM | POA: Diagnosis not present

## 2024-08-17 MED ORDER — CLONAZEPAM 0.5 MG PO TABS: 0.5000 mg | ORAL_TABLET | Freq: Three times a day (TID) | ORAL | 0 refills | Status: AC

## 2024-08-17 MED ORDER — PERAMPANEL 4 MG PO TABS: 4.0000 mg | ORAL_TABLET | Freq: Every day | ORAL | 0 refills | Status: AC

## 2024-08-17 MED ORDER — LACOSAMIDE 200 MG PO TABS: 200.0000 mg | ORAL_TABLET | Freq: Two times a day (BID) | ORAL | 0 refills | Status: AC

## 2024-08-17 NOTE — TOC Transition Note (Signed)
 Transition of Care [facility]) - Discharge Note   Patient Details  Name: Patricia White MRN: [id number] Date of Birth: 1971-01-24  Transition of Care [facility]) CM/SW Contact:  Hoy DELENA Bigness, LCSW Phone Number: 08/17/2024, 12:40 PM   Clinical Narrative:    Pt to discharge to return to [facility]. Pt going to room 219B. RN to call report to [phone]. DC packet placed at RN station. VM left for pt's legal guardian to inform of discharge. PTAR called at [phone].      Barriers to Discharge: Barriers Resolved   Patient Goals and CMS Choice Patient states their goals for this hospitalization and ongoing recovery are:: Return to [facility] per Legal Guardian [facility] CMS Medicare.gov Compare Post Acute Care list provided to:: Legal Guardian Choice offered to / list presented to : [facility] POA / Guardian [place] ownership interest in [facility].provided to:: [facility] POA / Guardian    Discharge Placement   Existing PASRR number confirmed : 08/17/24          Patient chooses bed at: Other - please specify in the comment section below: [facility]) Patient to be transferred to facility by: PTAR Name of family member notified: Legal Guardian, [name] Irving Patient and family notified of of transfer: 08/17/24  Discharge Plan and Services Additional resources added to the After Visit Summary for   In-house Referral: NA Discharge Planning Services: CM Consult Post Acute Care Choice: Nursing Home          DME Arranged: N/A DME Agency: NA       HH Arranged: NA HH Agency: NA        Social Drivers of Health (SDOH) Interventions SDOH Screenings   Food Insecurity: Patient Unable To Answer (08/13/2024)  Housing: Unknown (08/13/2024)  Transportation Needs: Patient Unable To Answer (08/13/2024)  Utilities: Patient Unable To Answer (08/13/2024)  Alcohol Screen: Low Risk (03/24/2023)  Depression (PHQ2-9): Low Risk (08/09/2023)  Financial Resource Strain: Low Risk (03/24/2023)   Physical Activity: Insufficiently Active (03/24/2023)  Social Connections: Moderately Isolated (11/19/2023)  Stress: No Stress Concern Present (03/24/2023)  Tobacco Use: Low Risk (08/15/2024)  Health Literacy: Adequate Health Literacy (03/24/2023)     Readmission Risk Interventions    08/17/2024   12:39 PM 08/15/2024    9:57 AM 11/16/2023   11:48 AM  Readmission Risk Prevention Plan  Transportation Screening Complete Complete Complete  PCP or Specialist Appt within 5-7 Days   Complete  Home Care Screening   Complete  Medication Review (RN CM)   Complete  Medication Review [employer]) Complete Complete   PCP or Specialist appointment within 3-5 days of discharge Complete Complete   HRI or Home Care Consult Complete Complete   SW Recovery Care/Counseling Consult Complete Complete   Palliative Care Screening Not Applicable Not Applicable   Skilled Nursing Facility Complete Complete

## 2024-08-17 NOTE — [place] FL2 (Signed)
 " [place]  MEDICAID FL2 LEVEL OF CARE FORM     IDENTIFICATION  Patient Name: Patricia White Birthdate: Oct 11, 1970 Sex: female Admission Date (Current Location): 08/13/2024  [facility] and [place] Number:  Producer, Television/film/video and Address:  [facility],  [address], [place] [address]      Provider Number: [id number]  Attending Physician Name and Address:  Raenelle Coria, MD  Relative Name and Phone Number:  Legal Sherlynn [name] Irving   [phone]    Current Level of Care: Hospital Recommended Level of Care: Skilled Nursing Facility Prior Approval Number:    Date Approved/Denied:   PASRR Number: [id number] F Expires 10/14/2024  Discharge Plan: SNF    Current Diagnoses: Patient Active Problem List   Diagnosis Date Noted   Hypothermia 08/13/2024   Acute urinary retention 08/02/2024   Acute encephalopathy 08/01/2024   Acute metabolic encephalopathy 07/31/2024   Prolonged QT interval 07/31/2024   Elevated phenytoin  level 05/05/2024   AMS (altered mental status) 05/02/2024   Sepsis (HCC) 02/06/2024   SIRS (systemic inflammatory response syndrome) (HCC) 02/05/2024   Blood per rectum 01/02/2024   Ulcer of upper extremity 12/31/2023   Facial swelling 12/30/2023   Open wound of left upper arm 12/29/2023   Sinus tachycardia 12/29/2023   Pressure injury of skin of left elbow 12/29/2023   Intellectual disability 12/28/2023   Cerebral palsy (HCC) 12/28/2023   Infestation by bed bug 12/27/2023   Seizure-like activity (HCC) 12/27/2023   Left arm pain 11/24/2023   Hypotension 11/23/2023   Left arm swelling 11/23/2023   Status epilepticus (HCC) 11/19/2023   Pancytopenia (HCC) 11/11/2023   Hypomagnesemia 11/09/2023   Dehydration 11/08/2023   Gastritis and gastroduodenitis 11/08/2023   Gastric polyp 11/08/2023   Nausea without vomiting 11/08/2023   Protein-calorie malnutrition, severe 11/08/2023   Decreased appetite 11/05/2023   Lower urinary tract  infectious disease 11/01/2023   Chronic health problem 11/01/2023   Oral thrush 11/01/2023   FTT (failure to thrive) in adult 11/01/2023   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   Hot flashes due to menopause 06/30/2021   Hyperlipidemia associated with type 2 diabetes mellitus (HCC) 11/24/2020   Diabetes mellitus without complication (HCC) 11/23/2020   Angioedema 12/10/2019   Lesion of mandible 12/10/2019   Seasonal allergies 11/20/2019   Essential hypertension 10/23/2019   Seizure disorder (HCC) 02/23/2016   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   Obesity (BMI 30.0-34.9) 10/13/2009   Infantile cerebral palsy (HCC) 10/11/2006    Orientation RESPIRATION BLADDER Height & Weight     Self, Place  Normal Incontinent, External catheter Weight: 154 lb 1.6 oz (69.9 kg) Height:  5' 5 (165.1 cm)  BEHAVIORAL SYMPTOMS/MOOD NEUROLOGICAL BOWEL NUTRITION STATUS      Incontinent Diet (See DC summary)  AMBULATORY STATUS COMMUNICATION OF NEEDS Skin   Total Care Verbally PU Stage and Appropriate Care   PU Stage 2 Dressing: Daily                   Personal Care Assistance Level of Assistance  Bathing, Feeding, Dressing Bathing Assistance: Maximum assistance Feeding assistance: Maximum assistance Dressing Assistance: Maximum assistance     Functional Limitations Info  Sight, Hearing, Speech Sight Info: Adequate Hearing Info: Adequate Speech Info: Impaired    SPECIAL CARE FACTORS FREQUENCY  PT (By licensed PT), OT (By licensed OT)     PT Frequency: 5x/wk OT Frequency: 5x/wk            Contractures  Contractures Info: Not present    Additional Factors Info  Code Status, Allergies, Psychotropic Code Status Info: FULL Allergies Info: NKA Psychotropic Info: Klonopin , Keppra , Dilantin , Vimpat , Fycompa          Current Medications (08/17/2024):  This is the current hospital active medication list Current Facility-Administered  Medications  Medication Dose Route Frequency Provider Last Rate Last Admin   Chlorhexidine  Gluconate Cloth 2 % PADS 6 each  6 each Topical Daily Layman Raisin, DO   6 each at 08/15/24 1253   clonazePAM  (KLONOPIN ) tablet 0.5 mg  0.5 mg Oral TID Ghimire, Kuber, MD   0.5 mg at 08/17/24 0825   enoxaparin  (LOVENOX ) injection 40 mg  40 mg Subcutaneous Q24H Layman Raisin, DO   40 mg at 08/16/24 1544   lacosamide  (VIMPAT ) tablet 200 mg  200 mg Oral BID Autry, Lauren E, PA-C   200 mg at 08/17/24 9173   levETIRAcetam  (KEPPRA ) tablet 1,000 mg  1,000 mg Oral Daily Autry, Lauren E, PA-C   1,000 mg at 08/17/24 0827   levETIRAcetam  (KEPPRA ) tablet 1,500 mg  1,500 mg Oral QHS Autry, Lauren E, PA-C   1,500 mg at 08/16/24 2119   lip balm (CARMEX) ointment 1 Application  1 Application Topical PRN Raenelle Coria, MD       mirtazapine  (REMERON ) tablet 22.5 mg  22.5 mg Oral QHS Autry, Lauren E, PA-C   22.5 mg at 08/16/24 2119   Oral care mouth rinse  15 mL Mouth Rinse 4 times per day Raenelle Coria, MD   15 mL at 08/17/24 0827   Oral care mouth rinse  15 mL Mouth Rinse PRN Raenelle Coria, MD       pantoprazole  (PROTONIX ) EC tablet 40 mg  40 mg Oral QAC breakfast Autry, Lauren E, PA-C   40 mg at 08/17/24 0827   perampanel  (FYCOMPA ) tablet 4 mg  4 mg Oral Daily Autry, Lauren E, PA-C   4 mg at 08/17/24 9173   phenazopyridine  (PYRIDIUM ) tablet 100 mg  100 mg Oral TID WC PRN Autry, Lauren E, PA-C   100 mg at 08/13/24 1711   phenytoin  (DILANTIN ) chewable tablet 75 mg  75 mg Oral Daily Autry, Lauren E, PA-C   75 mg at 08/17/24 9175   phenytoin  (DILANTIN ) ER capsule 100 mg  100 mg Oral QHS Autry, Lauren E, PA-C   100 mg at 08/16/24 2119   polyethylene glycol (MIRALAX  / GLYCOLAX ) packet 17 g  17 g Oral Daily PRN Layman Raisin, DO   17 g at 08/17/24 9177   QUEtiapine  (SEROQUEL ) tablet 12.5 mg  12.5 mg Oral QHS Ghimire, Kuber, MD   12.5 mg at 08/16/24 2120   rosuvastatin  (CRESTOR ) tablet 20 mg  20 mg Oral QHS Autry,  Lauren E, PA-C   20 mg at 08/16/24 2120   senna (SENOKOT) tablet 8.6 mg  1 tablet Oral BID PRN Marshall, Jessica, DO   8.6 mg at 08/17/24 9173     Discharge Medications: Please see discharge summary for a list of discharge medications.  Relevant Imaging Results:  Relevant Lab Results:   Additional Information SSN [id number]  Hoy DELENA Bigness, LCSW     "

## 2024-08-17 NOTE — Discharge Summary (Signed)
 Physician Discharge Summary  Patricia White FMW:[id number] DOB: February 27, 1971 DOA: 08/13/2024  PCP: Alba Sharper, MD  Admit date: 08/13/2024 Discharge date: 08/17/2024  Admitted From: Long-term nursing care Disposition: Long-term nursing care  Recommendations for Outpatient Follow-up:  Follow appropriate medication administrations  Schedule follow-up with her neurologist  Discharge Condition: Fair CODE STATUS: Full code Diet recommendation: Regular diet, assist feeding, special precautions.  Discharge summary: 54 year old with cerebral palsy, baseline aphasia, ambulatory dysfunction, seizure disorder on multiple seizure medications and Klonopin , recent 2 hospitalizations with altered mental status and hypothermia again brought to the emergency room with altered mental status, fell off the bed and also fell in the hallway hitting her door( patient does not walk).  In the emergency room she was found hypothermic, agitated.  Temperature improved with Bair hugger.  Skeletal survey was negative. 12/31, admitted from ER to the ICU due to hypothermia and altered mental status. 1/1, transferred out of ICU. 1/4, monitored in the hospital.  Medications resumed.  Remains stable.  Transfer back to SNF today.  Treated for following conditions.   Assessment & Plan:   Acute metabolic encephalopathy in a patient with underlying cerebral palsy Seizure disorder, possible recurrent seizure Hypothermia, likely environmental.  No infection was found.   CT head, cervical spine were negative. TSH, B12, a.m. cortisol, normal. Keppra  level subtherapeutic.  20.2.   phenytoin  level, pending, will follow-up results. Ammonia, normal Blood cultures negative. Continue to provide supportive care.  Back on all seizure medications including Vimpat , Keppra , Dilantin , Fycompa  and Klonopin .  Seroquel  was discontinued last admission.  Repeat EKG reviewed, QTc 447.   Does not benefit with new EEG, patient already has  baseline abnormal EEG. There was no report of seizure-like activities at the facility. Patient likely back to her baseline today. Will need ongoing care.  Total assist.   Acute urinary retention: Due to altered mentation.  Successfully voided after Foley catheter was removed.   Recurrent event: This is her third admission with similar issues without any obvious cause.  May need to ensure proper medication administration at the nursing home.   Chronically sick.  Medically stable to transition back to long-term care today.  Discharge Diagnoses:  Active Problems:   Hypothermia    Discharge Instructions  Discharge Instructions     Diet general   Complete by: As directed    Increase activity slowly   Complete by: As directed    No wound care   Complete by: As directed       Allergies as of 08/17/2024   No Known Allergies      Medication List     STOP taking these medications    QUEtiapine  25 MG tablet Commonly known as: SEROQUEL        TAKE these medications    acetaminophen  325 MG tablet Commonly known as: TYLENOL  Take 2 tablets (650 mg total) by mouth every 6 (six) hours as needed for mild pain (pain score 1-3) or fever.   clonazePAM  0.5 MG tablet Commonly known as: KLONOPIN  Take 1 tablet (0.5 mg total) by mouth 3 (three) times daily.   Keppra  500 MG tablet Generic drug: levETIRAcetam  Take 1,000-1,500 mg by mouth See admin instructions. Take 1,000 mg by mouth in the morning and 1,500 mg at bedtime   lacosamide  200 MG Tabs tablet Commonly known as: VIMPAT  Take 1 tablet (200 mg total) by mouth 2 (two) times daily.   melatonin 3 MG Tabs tablet Take 3 mg by mouth at bedtime.   mirtazapine   7.5 MG tablet Commonly known as: REMERON  Take 22.5 mg by mouth at bedtime.   nystatin  powder Apply 1 Application topically See admin instructions. Apply under both breasts 2 times a day until healed   ondansetron  4 MG tablet Commonly known as: ZOFRAN  Take 4 mg by  mouth every 6 (six) hours as needed for nausea or vomiting (may be crushed and mixed into 5 ml's of water if needed- to digest).   pantoprazole  40 MG tablet Commonly known as: PROTONIX  Take 1 tablet (40 mg total) by mouth daily. What changed: when to take this   Perampanel  4 MG Tabs Take 1 tablet (4 mg total) by mouth daily.   phenytoin  100 MG ER capsule Commonly known as: DILANTIN  Take 1 capsule (100 mg total) by mouth at bedtime.   phenytoin  50 MG tablet Commonly known as: DILANTIN  Chew 1.5 tablets (75 mg total) by mouth daily.   polyethylene glycol 17 g packet Commonly known as: MIRALAX  / GLYCOLAX  Take 17 g by mouth daily.   rosuvastatin  20 MG tablet Commonly known as: CRESTOR  Take 1 tablet (20 mg total) by mouth daily. What changed: when to take this   senna-docusate 8.6-50 MG tablet Commonly known as: Senokot-S Take 1 tablet by mouth daily.        Allergies[1]  Consultations: Critical care   Procedures/Studies: CT Cervical Spine Wo Contrast Result Date: 08/13/2024 EXAM: CT CERVICAL SPINE WITHOUT CONTRAST 08/13/2024 01:59:14 AM TECHNIQUE: CT of the cervical spine was performed without the administration of intravenous contrast. Multiplanar reformatted images are provided for review. Automated exposure control, iterative reconstruction, and/or weight based adjustment of the mA/kV was utilized to reduce the radiation dose to as low as reasonably achievable. COMPARISON: None available. CLINICAL HISTORY: Neck trauma, intoxicated or obtunded (Age >= 16y) FINDINGS: BONES AND ALIGNMENT: No acute fracture or traumatic malalignment. DEGENERATIVE CHANGES: Discontinuous anterior osteophytes at C5 and C6. SOFT TISSUES: No prevertebral soft tissue swelling. IMPRESSION: 1. No evidence of acute traumatic injury. Electronically signed by: Franky Stanford MD 08/13/2024 02:14 AM EST RP Workstation: [id number]   CT Head Wo Contrast Result Date: 08/13/2024 EXAM: CT HEAD WITHOUT CONTRAST  08/13/2024 01:59:14 AM TECHNIQUE: CT of the head was performed without the administration of intravenous contrast. Automated exposure control, iterative reconstruction, and/or weight based adjustment of the mA/kV was utilized to reduce the radiation dose to as low as reasonably achievable. COMPARISON: 07/31/2024 CLINICAL HISTORY: Head trauma, moderate-severe FINDINGS: BRAIN AND VENTRICLES: No acute hemorrhage. No evidence of acute infarct. No hydrocephalus. No extra-axial collection. No mass effect or midline shift. ORBITS: No acute abnormality. SINUSES: Mild mucosal thickening in frontal and ethmoid air cells. SOFT TISSUES AND SKULL: Debris in bilateral external auditory canals. No acute soft tissue abnormality. No skull fracture. IMPRESSION: 1. No acute intracranial abnormality. Electronically signed by: Franky Stanford MD 08/13/2024 02:06 AM EST RP Workstation: [id number]   DG Chest Port 1 View Result Date: 08/13/2024 EXAM: 1 VIEW(S) XRAY OF THE CHEST 08/13/2024 01:12:00 AM COMPARISON: 07/31/2024 CLINICAL HISTORY: Questionable sepsis - evaluate for abnormality FINDINGS: LUNGS AND PLEURA: Low lung volumes. Elevated left hemidiaphragm. Left lower lung zone linear atelectasis. No pleural effusion. No pneumothorax. HEART AND MEDIASTINUM: No acute abnormality of the cardiac and mediastinal silhouettes. BONES AND SOFT TISSUES: No acute osseous abnormality. IMPRESSION: 1. No acute findings. Electronically signed by: Greig Pique MD 08/13/2024 01:39 AM EST RP Workstation: [id number]   CT Head Wo Contrast Result Date: 07/31/2024 EXAM: CT HEAD WITHOUT CONTRAST 07/31/2024 04:07:58 PM TECHNIQUE: CT of the  head was performed without the administration of intravenous contrast. Automated exposure control, iterative reconstruction, and/or weight based adjustment of the mA/kV was utilized to reduce the radiation dose to as low as reasonably achievable. COMPARISON: None available. CLINICAL HISTORY: Mental status change,  unknown cause Mental status change, unknown cause FINDINGS: BRAIN AND VENTRICLES: No acute hemorrhage. No evidence of acute infarct. No hydrocephalus. No extra-axial collection. No mass effect or midline shift. ORBITS: No acute abnormality. SINUSES: No acute abnormality. SOFT TISSUES AND SKULL: No acute soft tissue abnormality. No skull fracture. IMPRESSION: 1. No acute intracranial abnormality. Electronically signed by: Dorethia Molt MD 07/31/2024 05:54 PM EST RP Workstation: [id number]   DG Chest Portable 1 View Result Date: 07/31/2024 EXAM: 1 VIEW(S) XRAY OF THE CHEST 07/31/2024 04:28:00 PM COMPARISON: 06/29/2024 CLINICAL HISTORY: cough, AMS FINDINGS: LUNGS AND PLEURA: Low lung volume. Elevated left hemidiaphragm. No focal pulmonary opacity. No pleural effusion. No pneumothorax. HEART AND MEDIASTINUM: No acute abnormality of the cardiac and mediastinal silhouettes. BONES AND SOFT TISSUES: No acute osseous abnormality. IMPRESSION: 1. No acute findings. Electronically signed by: Greig Pique MD 07/31/2024 04:50 PM EST RP Workstation: [id number]   (Echo, Carotid, EGD, Colonoscopy, ERCP)    Subjective: Patient seen and examined.  Nursing reported no overnight events.  Patient looks pleasant.  She is trying to say something, she tells me she is in the hospital but difficult to understand.  No overnight events.  Remains alert and awake.   Discharge Exam: Vitals:   08/16/24 2143 08/17/24 0626  BP: 138/89 122/86  Pulse: 90 72  Resp: 16 16  Temp:  97.6 F (36.4 C)  SpO2: 98% 94%   Vitals:   08/16/24 0522 08/16/24 1500 08/16/24 2143 08/17/24 0626  BP: 111/77 121/81 138/89 122/86  Pulse: 84 96 90 72  Resp: 16 16 16 16   Temp: 97.7 F (36.5 C) 98.2 F (36.8 C) 98.7 F (37.1 C) 97.6 F (36.4 C)  TempSrc: Oral Oral Oral Oral  SpO2: 99% 100% 98% 94%  Weight:    69.9 kg  Height:        General: Looks fairly comfortable. Patient is aphasic, she tries to voice some words difficult to  understand.  Patient is alert and awake.  She could tell she is in the hospital.  She tells she feels fine.  Moves all extremities.  Obvious gross generalized weakness all extremities. Cardiovascular: RRR, S1/S2 +, no rubs, no gallops.  No added sounds. Respiratory: CTA bilaterally, no wheezing, no rhonchi.  No added sounds.  On room air. Abdominal: Soft, NT, ND, bowel sounds + Extremities: no edema, no cyanosis    The results of significant diagnostics from this hospitalization (including imaging, microbiology, ancillary and laboratory) are listed below for reference.     Microbiology: Recent Results (from the past 240 hours)  Blood Culture (routine x 2)     Status: None (Preliminary result)   Collection Time: 08/13/24 12:54 AM   Specimen: BLOOD  Result Value Ref Range Status   Specimen Description   Final    BLOOD SITE NOT SPECIFIED Performed at [facility], [address], [place], [place] [address]    Special Requests   Final    BOTTLES DRAWN AEROBIC AND ANAEROBIC Blood Culture results may not be optimal due to an inadequate volume of blood received in culture bottles Performed at [facility], [address], [place], [place] [address]    Culture   Final    NO GROWTH 4 DAYS  Performed at [facility], [address] [place], [place] [address]    Report Status PENDING  Incomplete  MRSA Next Gen by PCR, Nasal     Status: Abnormal   Collection Time: 08/13/24  3:31 PM   Specimen: Nasal Mucosa; Nasal Swab  Result Value Ref Range Status   MRSA by PCR Next Gen DETECTED (A) NOT DETECTED Final    Comment: (NOTE) The GeneXpert MRSA Assay (FDA approved for NASAL specimens only), is one component of a comprehensive MRSA colonization surveillance program. It is not intended to diagnose MRSA infection nor to guide or monitor treatment for MRSA infections. Test performance is not FDA approved in patients less than 49 years old. Performed at  [facility], [address], [place], [place] [address]   Blood Culture (routine x 2)     Status: None (Preliminary result)   Collection Time: 08/13/24  9:17 PM   Specimen: BLOOD LEFT HAND  Result Value Ref Range Status   Specimen Description   Final    BLOOD LEFT HAND Performed at [facility], [address], [place], [place] [address]    Special Requests   Final    BOTTLES DRAWN AEROBIC AND ANAEROBIC Blood Culture results may not be optimal due to an inadequate volume of blood received in culture bottles Performed at [facility], [address] [place], [place] [address]    Culture   Final    NO GROWTH 3 DAYS Performed at [facility], [address] [place], [place] [address]    Report Status PENDING  Incomplete     Labs: BNP (last 3 results) Recent Labs    12/30/23 0001  BNP 16.0   Basic Metabolic Panel: Recent Labs  Lab 08/13/24 0054 08/13/24 0731 08/13/24 1804 08/14/24 0330 08/15/24 0238  NA 141  --  143 149* 140  K 3.0*  --  5.1 3.9 3.7  CL 99  --  103 98 101  CO2 32  --  25 31 28   GLUCOSE 118*  --  76 88 87  BUN 6  --  6 6 6   CREATININE 0.66 0.65 0.84 0.92 0.91  CALCIUM  10.0  --  9.7 9.0 9.6  MG  --   --   --  1.9 2.3   Liver Function Tests: Recent Labs  Lab 08/13/24 0054  AST 29  ALT 20  ALKPHOS 211*  BILITOT 0.3  PROT 8.8*  ALBUMIN 3.9   No results for input(s): LIPASE, AMYLASE in the last 168 hours. Recent Labs  Lab 08/13/24 0453  AMMONIA 34   CBC: Recent Labs  Lab 08/13/24 0054 08/13/24 0731 08/14/24 0330 08/15/24 0238  WBC 4.1 5.2 6.0 6.8  NEUTROABS 2.4  --   --  4.4  HGB 10.6* 8.6* 9.2* 9.3*  HCT 34.0* 27.6* 29.7* 29.9*  MCV 84.6 84.7 82.7 84.2  PLT 313 276 319 359   Cardiac Enzymes: No results for input(s): CKTOTAL, CKMB, CKMBINDEX, TROPONINI in the last 168 hours. BNP: Invalid input(s): POCBNP CBG: Recent Labs  Lab 08/13/24 0035  GLUCAP 131*    D-Dimer No results for input(s): DDIMER in the last 72 hours. Hgb A1c No results for input(s): HGBA1C in the last 72 hours. Lipid Profile No results for input(s): CHOL, HDL, LDLCALC, TRIG, CHOLHDL, LDLDIRECT in the last 72 hours. Thyroid  function studies No results for input(s): TSH, T4TOTAL, T3FREE, THYROIDAB in the last 72 hours.  Invalid input(s): FREET3  Anemia work up No results for input(s): VITAMINB12, FOLATE, FERRITIN, TIBC, IRON, RETICCTPCT in the last 72 hours. Urinalysis    Component Value Date/Time   COLORURINE YELLOW 08/13/2024 0216   APPEARANCEUR CLEAR 08/13/2024 0216   LABSPEC 1.019 08/13/2024 0216   PHURINE 5.0 08/13/2024 0216   GLUCOSEU NEGATIVE 08/13/2024 0216   HGBUR NEGATIVE 08/13/2024 0216   BILIRUBINUR NEGATIVE 08/13/2024 0216   BILIRUBINUR negative 11/25/2020 1220   KETONESUR NEGATIVE 08/13/2024 0216   PROTEINUR NEGATIVE 08/13/2024 0216   UROBILINOGEN 0.2 11/25/2020 1220   NITRITE NEGATIVE 08/13/2024 0216   LEUKOCYTESUR NEGATIVE 08/13/2024 0216   Sepsis Labs Recent Labs  Lab 08/13/24 0054 08/13/24 0731 08/14/24 0330 08/15/24 0238  WBC 4.1 5.2 6.0 6.8   Microbiology Recent Results (from the past 240 hours)  Blood Culture (routine x 2)     Status: None (Preliminary result)   Collection Time: 08/13/24 12:54 AM   Specimen: BLOOD  Result Value Ref Range Status   Specimen Description   Final    BLOOD SITE NOT SPECIFIED Performed at [facility], [address] [place], [place] [address]    Special Requests   Final    BOTTLES DRAWN AEROBIC AND ANAEROBIC Blood Culture results may not be optimal due to an inadequate volume of blood received in culture bottles Performed at [facility], [address], [place], [place] [address]    Culture   Final    NO GROWTH 4 DAYS Performed at [facility], [address], [place], [place] [address]    Report Status PENDING   Incomplete  MRSA Next Gen by PCR, Nasal     Status: Abnormal   Collection Time: 08/13/24  3:31 PM   Specimen: Nasal Mucosa; Nasal Swab  Result Value Ref Range Status   MRSA by PCR Next Gen DETECTED (A) NOT DETECTED Final    Comment: (NOTE) The GeneXpert MRSA Assay (FDA approved for NASAL specimens only), is one component of a comprehensive MRSA colonization surveillance program. It is not intended to diagnose MRSA infection nor to guide or monitor treatment for MRSA infections. Test performance is not FDA approved in patients less than 26 years old. Performed at [facility], [address] [place], [place] [address]   Blood Culture (routine x 2)     Status: None (Preliminary result)   Collection Time: 08/13/24  9:17 PM   Specimen: BLOOD LEFT HAND  Result Value Ref Range Status   Specimen Description   Final    BLOOD LEFT HAND Performed at [facility], [address], [place], [place] [address]    Special Requests   Final    BOTTLES DRAWN AEROBIC AND ANAEROBIC Blood Culture results may not be optimal due to an inadequate volume of blood received in culture bottles Performed at [facility], [address], [place], [place] [address]    Culture   Final    NO GROWTH 3 DAYS Performed at [facility], [address], [place], [place] [address]    Report Status PENDING  Incomplete     Time coordinating discharge: 35 minutes  SIGNED:   Renato Applebaum, MD  [facility] 08/17/2024, 11:46 AM     [1] No Known Allergies

## 2024-08-17 NOTE — Progress Notes (Signed)
 Report called to [facility], nurse [place] prior to D/C. Patient's BP 91/63, HR 80s with PTAR. MD Ghimire aware, states to continue with D/C. Patient alert, states she is ready to D/C.

## 2024-08-18 LAB — CULTURE, BLOOD (ROUTINE X 2): Culture: NO GROWTH

## 2024-08-18 LAB — VITAMIN B1: Vitamin B1 (Thiamine): 71.2 nmol/L (ref 66.5–200.0)

## 2024-08-19 LAB — CULTURE, BLOOD (ROUTINE X 2): Culture: NO GROWTH

## 2024-08-19 LAB — PHENYTOIN LEVEL, FREE AND TOTAL: Phenytoin, Total: 20.8 ug/mL — ABNORMAL HIGH (ref 10.0–20.0)

## 2024-09-12 NOTE — Progress Notes (Signed)
 Patricia White                                          MRN: [id number]   09/12/2024   The [facility] Quality Team Specialist reviewed this patient medical record for the purposes of chart review for care gap closure. The following were reviewed: abstraction for care gap closure-glycemic status assessment.    [facility]I Quality Team

## 2024-11-05 ENCOUNTER — Ambulatory Visit: Admitting: Diagnostic Neuroimaging
